# Patient Record
Sex: Male | Born: 1950
Health system: Southern US, Community
[De-identification: ages and names within clinical notes are randomized; demographics above are authoritative.]

## PROBLEM LIST (undated history)

## (undated) ENCOUNTER — Emergency Department: Discharge status: Absent without leave | Payer: Medicare HMO

## (undated) DIAGNOSIS — I6523 Occlusion and stenosis of bilateral carotid arteries: Secondary | ICD-10-CM

## (undated) DIAGNOSIS — I5023 Acute on chronic systolic (congestive) heart failure: Secondary | ICD-10-CM

## (undated) DIAGNOSIS — M869 Osteomyelitis, unspecified: Secondary | ICD-10-CM

## (undated) DIAGNOSIS — I255 Ischemic cardiomyopathy: Secondary | ICD-10-CM

## (undated) DIAGNOSIS — M4800 Spinal stenosis, site unspecified: Secondary | ICD-10-CM

## (undated) DIAGNOSIS — I700 Atherosclerosis of aorta: Secondary | ICD-10-CM

## (undated) DIAGNOSIS — R200 Anesthesia of skin: Secondary | ICD-10-CM

## (undated) DIAGNOSIS — C4490 Unspecified malignant neoplasm of skin, unspecified: Secondary | ICD-10-CM

## (undated) DIAGNOSIS — D509 Iron deficiency anemia, unspecified: Secondary | ICD-10-CM

## (undated) DIAGNOSIS — R7989 Other specified abnormal findings of blood chemistry: Secondary | ICD-10-CM

## (undated) DIAGNOSIS — I10 Essential (primary) hypertension: Secondary | ICD-10-CM

## (undated) DIAGNOSIS — E119 Type 2 diabetes mellitus without complications: Secondary | ICD-10-CM

## (undated) DIAGNOSIS — I83003 Varicose veins of unspecified lower extremity with ulcer of ankle: Secondary | ICD-10-CM

## (undated) DIAGNOSIS — M5136 Other intervertebral disc degeneration, lumbar region: Secondary | ICD-10-CM

## (undated) DIAGNOSIS — M48061 Spinal stenosis, lumbar region without neurogenic claudication: Secondary | ICD-10-CM

## (undated) DIAGNOSIS — M1990 Unspecified osteoarthritis, unspecified site: Secondary | ICD-10-CM

## (undated) DIAGNOSIS — J90 Pleural effusion, not elsewhere classified: Secondary | ICD-10-CM

## (undated) DIAGNOSIS — I2510 Atherosclerotic heart disease of native coronary artery without angina pectoris: Secondary | ICD-10-CM

## (undated) DIAGNOSIS — N032 Chronic nephritic syndrome with diffuse membranous glomerulonephritis: Secondary | ICD-10-CM

## (undated) DIAGNOSIS — I4510 Unspecified right bundle-branch block: Secondary | ICD-10-CM

## (undated) DIAGNOSIS — M7989 Other specified soft tissue disorders: Secondary | ICD-10-CM

## (undated) DIAGNOSIS — E1140 Type 2 diabetes mellitus with diabetic neuropathy, unspecified: Secondary | ICD-10-CM

## (undated) DIAGNOSIS — G4730 Sleep apnea, unspecified: Secondary | ICD-10-CM

## (undated) DIAGNOSIS — R0609 Other forms of dyspnea: Secondary | ICD-10-CM

## (undated) DIAGNOSIS — R931 Abnormal findings on diagnostic imaging of heart and coronary circulation: Secondary | ICD-10-CM

## (undated) DIAGNOSIS — I890 Lymphedema, not elsewhere classified: Secondary | ICD-10-CM

## (undated) DIAGNOSIS — G629 Polyneuropathy, unspecified: Secondary | ICD-10-CM

## (undated) DIAGNOSIS — Z9841 Cataract extraction status, right eye: Secondary | ICD-10-CM

## (undated) DIAGNOSIS — M51369 Other intervertebral disc degeneration, lumbar region without mention of lumbar back pain or lower extremity pain: Secondary | ICD-10-CM

## (undated) DIAGNOSIS — E785 Hyperlipidemia, unspecified: Secondary | ICD-10-CM

## (undated) DIAGNOSIS — N182 Chronic kidney disease, stage 2 (mild): Secondary | ICD-10-CM

## (undated) DIAGNOSIS — Z955 Presence of coronary angioplasty implant and graft: Secondary | ICD-10-CM

## (undated) DIAGNOSIS — R0600 Dyspnea, unspecified: Secondary | ICD-10-CM

## (undated) HISTORY — PX: JOINT REPLACEMENT: SHX530

## (undated) HISTORY — PX: CHOLECYSTECTOMY: SHX55

## (undated) HISTORY — PX: TOE AMPUTATION: SHX809

---

## 1983-03-17 HISTORY — PX: CHOLECYSTECTOMY: SHX55

## 1988-11-14 HISTORY — PX: UVULECTOMY: SHX2631

## 1998-03-16 DIAGNOSIS — Z955 Presence of coronary angioplasty implant and graft: Secondary | ICD-10-CM

## 1998-03-16 HISTORY — DX: Presence of coronary angioplasty implant and graft: Z95.5

## 1999-05-02 HISTORY — PX: CORONARY ANGIOPLASTY WITH STENT PLACEMENT: SHX49

## 2005-03-16 HISTORY — PX: TOTAL KNEE ARTHROPLASTY: SHX125

## 2005-03-16 HISTORY — PX: JOINT REPLACEMENT: SHX530

## 2005-03-25 ENCOUNTER — Emergency Department (HOSPITAL_COMMUNITY): Admission: EM | Admit: 2005-03-25 | Discharge: 2005-03-25 | Payer: Self-pay | Admitting: Emergency Medicine

## 2005-05-07 ENCOUNTER — Emergency Department (HOSPITAL_COMMUNITY): Admission: EM | Admit: 2005-05-07 | Discharge: 2005-05-07 | Payer: Self-pay | Admitting: Emergency Medicine

## 2006-08-25 IMAGING — CR DG CHEST 2V
2 series · 2 of 2 positions shown · non-contrast
Comparison: None.
COMPARISON: None.

CLINICAL DATA: Right knee DJD.  Pre-op respiratory exam. 
 CHEST ? 2 VIEW:

[w chest pa]
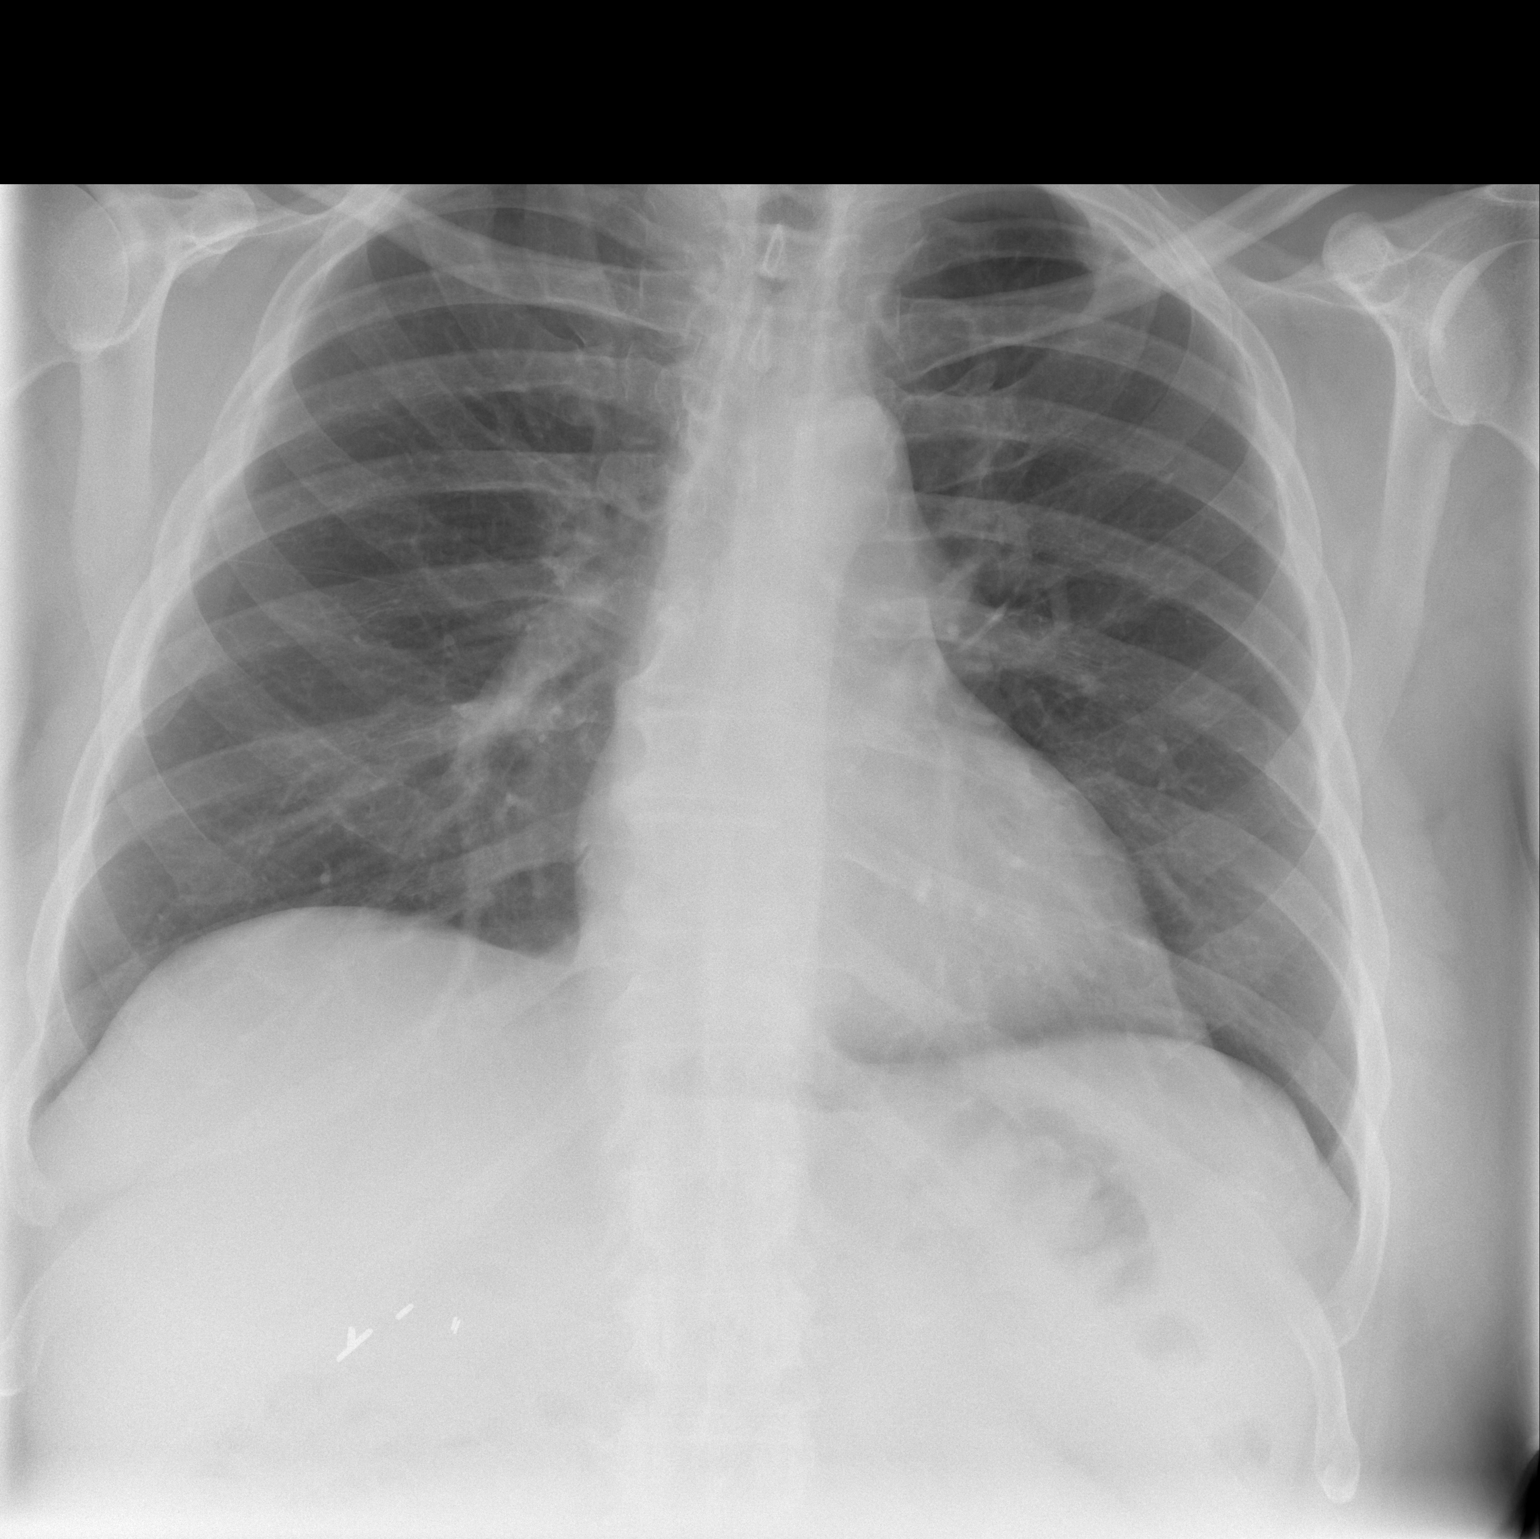

[w chest lat]
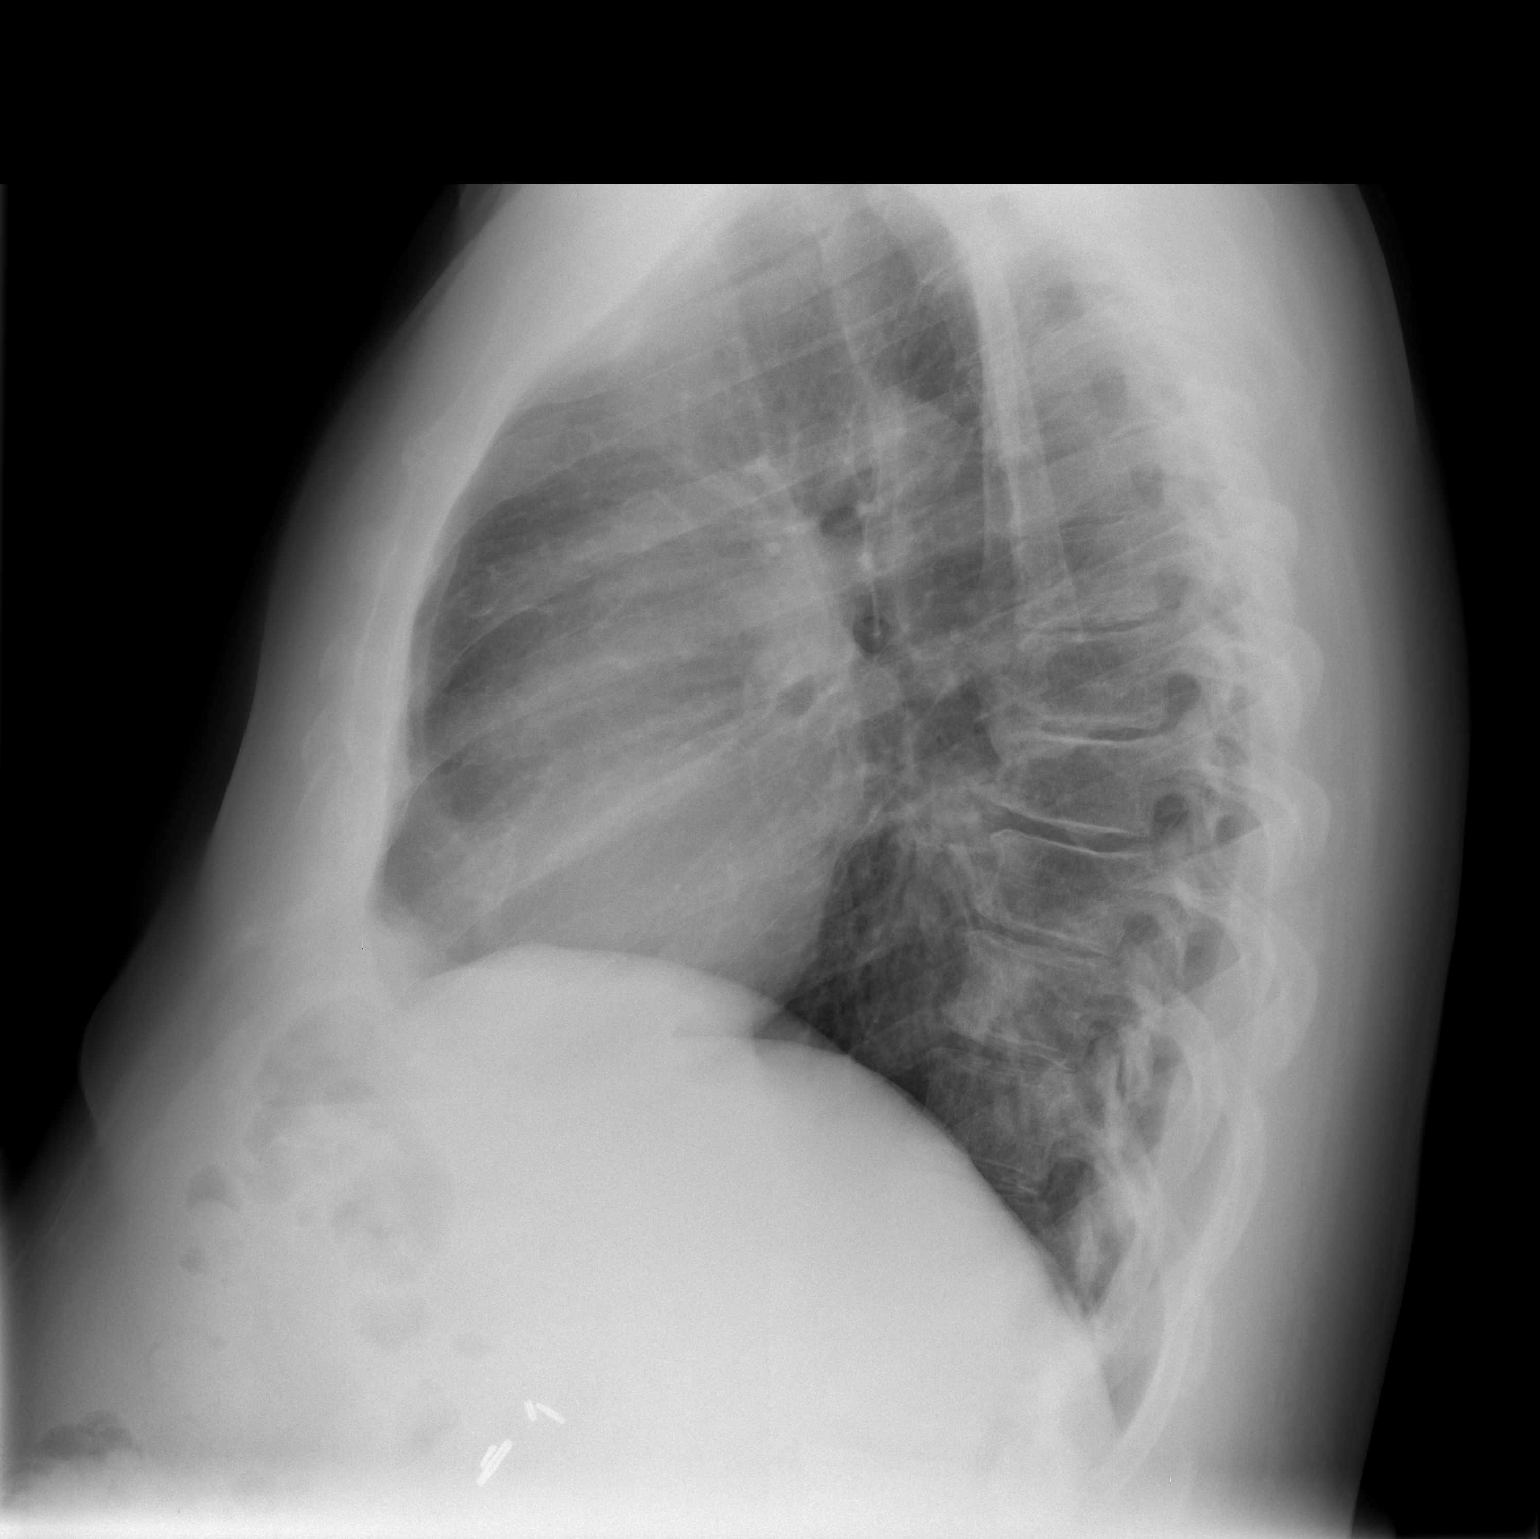

[2 of 2 positions shown; findings below may reference images not displayed]

FINDINGS: There is asymmetry of the [DATE] of ribs likely reflecting a developmental variant.  These do not appear to reflect cervical ribs.  The cardiomediastinal contours are normal.  The lungs are clear.  There is no pleural effusion.
IMPRESSION: No acute chest findings.  Asymmetry of the first ribs. 
 RIGHT KNEE ? 2 VIEW:
FINDINGS: There is a small knee joint effusion.  Tricompartmental degenerative changes are present, most advanced laterally and in the patellofemoral compartment.  No acute fracture or definite loose body is seen.
IMPRESSION: Tricompartmental osteoarthritis with small knee joint effusion.

## 2006-09-01 ENCOUNTER — Inpatient Hospital Stay (HOSPITAL_COMMUNITY): Admission: RE | Admit: 2006-09-01 | Discharge: 2006-09-04 | Payer: Self-pay | Admitting: Specialist

## 2006-10-01 ENCOUNTER — Encounter: Payer: Self-pay | Admitting: Specialist

## 2006-10-15 ENCOUNTER — Encounter: Payer: Self-pay | Admitting: Specialist

## 2006-11-15 ENCOUNTER — Encounter: Payer: Self-pay | Admitting: Specialist

## 2006-12-15 ENCOUNTER — Encounter: Payer: Self-pay | Admitting: Specialist

## 2007-01-15 ENCOUNTER — Encounter: Payer: Self-pay | Admitting: Specialist

## 2008-05-10 ENCOUNTER — Ambulatory Visit: Payer: Self-pay | Admitting: Internal Medicine

## 2008-07-06 ENCOUNTER — Ambulatory Visit: Payer: Self-pay

## 2009-05-15 ENCOUNTER — Ambulatory Visit: Payer: Self-pay | Admitting: Internal Medicine

## 2010-07-29 NOTE — Op Note (Signed)
NAMEJOMO, [name]                ACCOUNT NO.:  [id number]   MEDICAL RECORD NO.:  [id number]          PATIENT TYPE:  INP   LOCATION:  P329                         FACILITY:  [facility]   PHYSICIAN:  Jene Every, M.D.    DATE OF BIRTH:  1950/11/26   DATE OF PROCEDURE:  09/01/2006  DATE OF DISCHARGE:                               OPERATIVE REPORT   PREOPERATIVE DIAGNOSIS:  Degenerative joint disease, right knee.   POSTOPERATIVE DIAGNOSIS:  Degenerative joint disease, right knee.   PROCEDURE PERFORMED:  Right total knee arthroplasty.   ANESTHESIA:  General.   ASSISTANT:  Georges Lynch. Gioffre, M.D.   BRIEF HISTORY/INDICATIONS:  A 60 year old with valgus knee, end-stage  osteoarthritis refractory to conservative treatment.  Operative  intervention was indicated for replacement of degenerated joint.  Risks  and benefits discussed including bleeding, infection, damage to vascular  structures, DVT, PE, anesthetic complications, component failure,  contracture, need for manipulation and hardware revision.   TECHNIQUE:  With the patient in the supine position, after the induction  of adequate general anesthesia and 1 gm of Kefzol, the right lower  extremity is prepped and draped and exsanguinated in the usual sterile  fashion.  A thigh tourniquet is inflated to 350 mmHg.  A midline  incision was made over the patella in the anterior aspect of the tibia  medial to the tibial tubercle, full thickness through the skin and  subcutaneous tissue.  Performed a median patellar arthrotomy.  The  patella everted, and the knee was flexed.  Tricompartmental and severe  osteoarthrosis was noted.  A rongeur utilized to remove osteophytes from  all compartments.  The ACL was excised as well as the medial and lateral  meniscus.  Once mild elevation of the superficial medial collateral  ligament medially.  Prior to extension of the incision proximally and  distally due to his contracture.  A step drill was  utilized to enter the  femur, irrigated, T-handled Film/video editor.  A 5 degree left  intramedullary guide placed, pin.  An oscillating saw utilized to remove  first what we thought was 12 mm off of the distal femur, but we had to  have an additional 4 to take adequate bone off the femoral condyle  medially.  Next, we placed the distal femoral sizing guide and incised  to a 5 off the anterior cortex.  A distal femoral cutting block was then  applied after the appropriate holes were made in external rotations  through the external alignment jig.  Anterior and posterior chamfer cuts  were then performed with an oscillating saw.  Soft tissues were well  protected.  Next, a box cut was performed with a saw blade protecting  the posterior elements.  The tibia was then subluxed.  The remaining  menisci posteriorly were removed.  The remnant of the PCL removed.  I  thought there was some asymmetry __________ release, posterolateral  __________  release of the popliteus.  I then sized the tibia to a 5.  External alignment jig was placed, first 4 mm, then 6 mm off of the  tibia,  was utilized.  We tried for 4 first, then it was tightened to  flexion and extension with spacers, so we used a 6.  Removed the  posterior osteophytes but felt this was equal flexion and extension gap  with the spacers.  The tibia was subluxed.  We placed the trial 5 upon  that medial to the tibial tubercle with maximal coverage.  Placed our  step drill into the tibia, then the Delta thin punch.  We placed a trial  tibia, trial femur, and inserted a 10 insert.  Good flexion, good  extension, no lift-off.  Good stability of varus and valgus stress, 0-30  degrees.  Sized the patella to a 41.  Measured the patellar thickness.  Removed the appropriate amount on the planar.  Had 15 residual.  We used  the template and then drilled three peg holes, medializing the patella.  Placed a patellar button in there with flexion and  extension.  We  performed a slight lateral release to improve tracking.  It is  interesting that he had an osteophyte calcification of the distal aspect  of the patellar ligament, and we felt best to leave alone.  We felt  these components were satisfactory.  We removed all components, looked  posteriorly, removed any residual osteophytes, pulsatile lavage, cleaned  the knee.  The tibia was subluxed.  Protected with __________.  Dried  thoroughly.  Cement mixed on the back tablet in an appropriate fashion.  Placed the cement on the proximal tibia, then __________ of the femur.  Selected a 5 femur, 5 tibia.  Cemented the tibia in place.  Redundant  cement removed after impaction.  The femur in place, redundant cement  removed.  Placed a trial 10, reduced it in full extension.  The actual  load applied and full extension. Obvious redundant cement removed.  We  cemented the patellar button, applied the clamp.  After the appropriate  curing in the cement, removed the clamps and the insert.  After trialing  the flexion and extension, it was found to be satisfactory.  Redundant  cement was removed.  Bone wax on the cut cancellous surfaces that were  noted.  The wound was copiously irrigated again with pulsatile lavage.  We selected a 10 insert, inserting into place after subluxing the tibia.  Excellent stability for extension.  Full flexion at 140.  No lift off.  No subluxation.  No instability with varus or valgus stressing at 0-30  degrees with tracking of the patella.  Placed a Hemovac and brought it  out through a lateral stab wound in the skin.  Marcaine 0.25% with  epinephrine was placed in the joint.  We repaired the patellar tendon in  slight flexion with a #1 Vicryl interrupted figure-of-eight sutures.  The subcutaneous tissue was reapproximated with 2-0 Vicryl simple  sutures.  The skin was reapproximated with staples.  On pulling the thigh up, he had 90 degrees of flexion against  gravity.  The wound was  dressed sterilely and secured with an Ace bandage.  The tourniquet was  deflated with adequate vascularization  to the lower extremity.   The patient tolerated the procedure well with no complications.  Tourniquet time was 2 hours, 10 minutes.  The assistant, again, was Dr.  Darrelyn Hillock.      Jene Every, M.D.  Electronically Signed     JB/MEDQ  D:  09/01/2006  T:  09/01/2006  Job:  [id number]

## 2010-07-29 NOTE — H&P (Signed)
[name], [name]                ACCOUNT NO.:  [id number]   MEDICAL RECORD NO.:  [id number]          PATIENT TYPE:  INP   LOCATION:  NA                           FACILITY:  [facility]   PHYSICIAN:  Jene Every, M.D.    DATE OF BIRTH:  01-05-1951   DATE OF ADMISSION:  08/25/2006  DATE OF DISCHARGE:                              HISTORY & PHYSICAL   CHIEF COMPLAINT:  Right knee pain.   HISTORY:  [name] is a pleasant 60 year old gentleman who has had a  longstanding history of right knee pain.  He has previously undergone  arthroscopic debridement at another facility with a microfracture  technique.  He did okay from that but unfortunately, had recurrent pain  with noted mechanical type symptoms.  He also complained of loss of  range of motion that was very disabling for him.  He was first evaluated  by [place] in April where x-rays revealed end-stage collapse of the lateral  compartment with a varus deformity.  At that point, we did elect to  proceed with a localized cortisone injection.  Unfortunately, gave him  only short-term relief.  Dr. Shelle Iron did review old records from his  previous surgery from [facility] which again showed he has had  arthroscopy, no evidence of infection was noted at this time.  It is  felt at this point, due to the fact he has significant disabling  symptoms and end-stage changes of the knee, that he would benefit from a  total knee arthroplasty.  The risks and benefits of this were discussed  with the patient and medical clearance was obtained.  He does elect to  proceed.   MEDICAL HISTORY:  1. Coronary artery disease.  2. Previous history of diabetes mellitus.  3. Hyperlipidemia which is now diet controlled following weight loss.      He discontinued all medications.   CURRENT MEDICATIONS:  1. Aspirin 325 mg one p.o. daily.  2. Celebrex 200 mg one p.o. daily.  3. Darvocet N 100 one p.o. q.4-6h. p.r.n. pain.   ALLERGIES:  NONE.   PREVIOUS  SURGERY:  1. Cholecystectomy.  2. Right knee arthroscopy.  3. Stent placement in 2001.   SOCIAL HISTORY:  The patient is married.  No history of tobacco  consumption.  He does drink occasional wine. Primary care physicians are  at [facility] as well as his cardiologist.   FAMILY HISTORY:  Mother is 51 years old.  She has a history of breast  cancer as well as CVA.  Father deceased at age 91 with an MI.  He also  had a history of endocarditis.   REVIEW OF SYSTEMS:  GENERAL: The patient denies any fever, chills, night  sweats or bleeding tendencies.  CNS: No blurry, double vision, seizure,  headache or paralysis.  RESPIRATORY: No shortness of breath, productive  cough or hemoptysis.  CARDIOVASCULAR:  No chest pain, angina or  orthopnea. GU: No dysuria, hematuria or discharge.  GI: No nausea,  vomiting, diarrhea, constipation, melena or bloody stools.  MUSCULOSKELETAL:  As pertinent in HPI.   PHYSICAL EXAMINATION:  VITAL SIGNS:  Pulse is 70, respiratory rate 16,  BP 126/70.  GENERAL:  This well-developed, well-nourished gentleman who does well  with antalgic gait.  HEENT:  Atraumatic, normocephalic.  Pupils equal round and reactive to  light.  EOM intact.  NECK:  Supple with no lymphadenopathy.  CHEST:  Clear to auscultation bilaterally.  No rhonchi with wheezes or  rales.  BREASTS/GU:  Not examined, not pertinent to history of present illness.  HEART:  Regular rate and rhythm without murmurs, gallops or rubs.  ABDOMEN:  Soft, nontender, nondistended.  Bowel sounds x4.  SKIN:  No rashes or lesions are noted over.  EXTREMITIES:  With regard to the knee, he does have mild effusion noted.  He is 16 degrees of valgus deformity.  He does have a 5-10 degree  flexion contracture.  He is tender along the medial and lateral joint  line.   IMPRESSION:  End-stage degenerative changes of the right knee.   PLAN:  The patient will be admitted to [facility] to undergo a  right  total knee arthroplasty.      Roma Schanz, P.A.      Jene Every, M.D.  Electronically Signed    CS/MEDQ  D:  08/25/2006  T:  08/25/2006  Job:  [id number]

## 2010-11-25 ENCOUNTER — Other Ambulatory Visit: Payer: Self-pay | Admitting: Occupational Medicine

## 2010-11-25 ENCOUNTER — Ambulatory Visit: Payer: Self-pay

## 2010-11-25 DIAGNOSIS — M549 Dorsalgia, unspecified: Secondary | ICD-10-CM

## 2010-12-31 LAB — CBC
HCT: 25 — ABNORMAL LOW
HCT: 32.3 — ABNORMAL LOW
Hemoglobin: 11.4 — ABNORMAL LOW
MCHC: 34.2
MCHC: 35.3
MCV: 85.4
Platelets: 167
RBC: 3.25 — ABNORMAL LOW
RDW: 13.9
WBC: 5.9

## 2010-12-31 LAB — BASIC METABOLIC PANEL
BUN: 11
BUN: 6
CO2: 26
CO2: 28
CO2: 29
Calcium: 8.2 — ABNORMAL LOW
Calcium: 8.2 — ABNORMAL LOW
Chloride: 102
Chloride: 102
Creatinine, Ser: 0.71
Creatinine, Ser: 0.8
GFR calc Af Amer: >60
GFR calc non Af Amer: >60
Glucose, Bld: 176 — ABNORMAL HIGH
Glucose, Bld: 212 — ABNORMAL HIGH
Potassium: 4
Sodium: 136
Sodium: 137

## 2010-12-31 LAB — PROTIME-INR
Prothrombin Time: 14
Prothrombin Time: 18.7 — ABNORMAL HIGH

## 2010-12-31 LAB — TYPE AND SCREEN: ABO/RH(D): A POS

## 2010-12-31 LAB — ABO/RH: ABO/RH(D): A POS

## 2011-01-01 LAB — BASIC METABOLIC PANEL
BUN: 13
CO2: 26
Calcium: 9.3
Chloride: 106
Creatinine, Ser: 0.72
GFR calc Af Amer: >60
GFR calc non Af Amer: >60
Glucose, Bld: 235 — ABNORMAL HIGH
Sodium: 139

## 2011-01-01 LAB — URINALYSIS, ROUTINE W REFLEX MICROSCOPIC
Glucose, UA: >1000 — AB
Ketones, ur: NEGATIVE
Nitrite: NEGATIVE
Protein, ur: NEGATIVE
Specific Gravity, Urine: 1.041 — ABNORMAL HIGH
Urobilinogen, UA: 0.2
pH: 5

## 2011-01-01 LAB — URINE MICROSCOPIC-ADD ON

## 2011-01-01 LAB — CBC: Hemoglobin: 14.5

## 2011-03-31 ENCOUNTER — Inpatient Hospital Stay: Payer: Self-pay | Admitting: Internal Medicine

## 2011-03-31 LAB — COMPREHENSIVE METABOLIC PANEL
Alkaline Phosphatase: 84 U/L (ref 50–136)
Bilirubin,Total: 0.9 mg/dL (ref 0.2–1.0)
Co2: 28 mmol/L (ref 21–32)
Creatinine: 0.79 mg/dL (ref 0.60–1.30)
EGFR (African American): >60
Glucose: 327 mg/dL — ABNORMAL HIGH (ref 65–99)
Osmolality: 293 (ref 275–301)
Potassium: 4.1 mmol/L (ref 3.5–5.1)
SGPT (ALT): 41 U/L
Total Protein: 6.4 g/dL (ref 6.4–8.2)

## 2011-03-31 LAB — CBC WITH DIFFERENTIAL/PLATELET
Eosinophil %: 1.1 %
HCT: 31.6 % — ABNORMAL LOW (ref 40.0–52.0)
HGB: 10.8 g/dL — ABNORMAL LOW (ref 13.0–18.0)
Lymphocyte #: 1.3 10*3/uL (ref 1.0–3.6)
MCH: 29.5 pg (ref 26.0–34.0)
MCV: 86 fL (ref 80–100)
Monocyte %: 6.5 %
Neutrophil #: 5.2 10*3/uL (ref 1.4–6.5)
RBC: 3.66 10*6/uL — ABNORMAL LOW (ref 4.40–5.90)
RDW: 15.8 % — ABNORMAL HIGH (ref 11.5–14.5)

## 2011-04-02 LAB — CBC WITH DIFFERENTIAL/PLATELET
Basophil #: 0 10*3/uL (ref 0.0–0.1)
Eosinophil #: 0.1 10*3/uL (ref 0.0–0.7)
Lymphocyte #: 1.4 10*3/uL (ref 1.0–3.6)
MCV: 88 fL (ref 80–100)
Monocyte %: 6.1 %
Platelet: 194 10*3/uL (ref 150–440)
RDW: 15.5 % — ABNORMAL HIGH (ref 11.5–14.5)
WBC: 5.4 10*3/uL (ref 3.8–10.6)

## 2011-04-02 LAB — BASIC METABOLIC PANEL
Creatinine: 0.69 mg/dL (ref 0.60–1.30)
EGFR (African American): >60
EGFR (Non-African Amer.): >60
Glucose: 191 mg/dL — ABNORMAL HIGH (ref 65–99)
Sodium: 143 mmol/L (ref 136–145)

## 2011-04-03 LAB — CBC WITH DIFFERENTIAL/PLATELET
Basophil #: 0 10*3/uL (ref 0.0–0.1)
Basophil %: 0.3 %
Eosinophil #: 0.1 10*3/uL (ref 0.0–0.7)
HGB: 10.5 g/dL — ABNORMAL LOW (ref 13.0–18.0)
Lymphocyte %: 20.7 %
MCH: 29.3 pg (ref 26.0–34.0)
MCHC: 33.4 g/dL (ref 32.0–36.0)
Monocyte #: 0.3 10*3/uL (ref 0.0–0.7)
Neutrophil %: 71.5 %
Platelet: 210 10*3/uL (ref 150–440)
RBC: 3.58 10*6/uL — ABNORMAL LOW (ref 4.40–5.90)
RDW: 16.3 % — ABNORMAL HIGH (ref 11.5–14.5)
WBC: 5.9 10*3/uL (ref 3.8–10.6)

## 2011-04-03 LAB — GLUCOSE, RANDOM: Glucose: 104 mg/dL — ABNORMAL HIGH (ref 65–99)

## 2011-04-04 LAB — SEDIMENTATION RATE: Erythrocyte Sed Rate: 34 mm/hr — ABNORMAL HIGH (ref 0–20)

## 2011-04-05 LAB — WOUND CULTURE

## 2011-04-06 LAB — CBC WITH DIFFERENTIAL/PLATELET
Basophil %: 0.4 %
Eosinophil %: 0.6 %
Lymphocyte %: 5.8 %
MCV: 87 fL (ref 80–100)
Monocyte %: 8.7 %
Platelet: 184 10*3/uL (ref 150–440)
RDW: 16.4 % — ABNORMAL HIGH (ref 11.5–14.5)
WBC: 7.1 10*3/uL (ref 3.8–10.6)

## 2011-04-06 LAB — URINALYSIS, COMPLETE
Bilirubin,UR: NEGATIVE
Glucose,UR: 50 mg/dL (ref 0–75)
RBC,UR: 2 /HPF (ref 0–5)
Squamous Epithelial: <1
WBC UR: 3 /HPF (ref 0–5)

## 2011-04-06 LAB — BASIC METABOLIC PANEL
Anion Gap: 12 (ref 7–16)
Calcium, Total: 8 mg/dL — ABNORMAL LOW (ref 8.5–10.1)
Calcium, Total: 7.7 mg/dL — ABNORMAL LOW (ref 8.5–10.1)
Co2: 24 mmol/L (ref 21–32)
Co2: 24 mmol/L (ref 21–32)
EGFR (African American): 11 — ABNORMAL LOW
Osmolality: 291 (ref 275–301)
Potassium: 4.2 mmol/L (ref 3.5–5.1)
Sodium: 139 mmol/L (ref 136–145)
Sodium: 138 mmol/L (ref 136–145)

## 2011-04-06 LAB — CULTURE, BLOOD (SINGLE)

## 2011-04-07 LAB — BASIC METABOLIC PANEL
Anion Gap: 12 (ref 7–16)
Calcium, Total: 7.7 mg/dL — ABNORMAL LOW (ref 8.5–10.1)
Co2: 23 mmol/L (ref 21–32)
Creatinine: 7.42 mg/dL — ABNORMAL HIGH (ref 0.60–1.30)
EGFR (African American): 10 — ABNORMAL LOW
Osmolality: 290 (ref 275–301)

## 2011-04-07 LAB — PROTEIN / CREATININE RATIO, URINE: Protein/Creat. Ratio: 436 mg/gCREAT — ABNORMAL HIGH (ref 0–200)

## 2011-04-07 LAB — WOUND CULTURE

## 2011-04-08 LAB — PROTEIN ELECTROPHORESIS(ARMC)

## 2011-04-08 LAB — UR PROT ELECTROPHORESIS, URINE RANDOM

## 2011-04-09 LAB — BASIC METABOLIC PANEL
BUN: 71 mg/dL — ABNORMAL HIGH (ref 7–18)
Chloride: 106 mmol/L (ref 98–107)
Creatinine: 8.24 mg/dL — ABNORMAL HIGH (ref 0.60–1.30)
EGFR (Non-African Amer.): 7 — ABNORMAL LOW
Glucose: 115 mg/dL — ABNORMAL HIGH (ref 65–99)
Osmolality: 301 (ref 275–301)
Potassium: 4.6 mmol/L (ref 3.5–5.1)
Sodium: 140 mmol/L (ref 136–145)

## 2011-04-09 LAB — VANCOMYCIN, RANDOM: Vancomycin, Random: >50 ug/mL

## 2011-04-10 LAB — CBC WITH DIFFERENTIAL/PLATELET
Basophil #: 0 10*3/uL (ref 0.0–0.1)
Basophil %: 0.1 %
Eosinophil %: 2.1 %
HGB: 9.9 g/dL — ABNORMAL LOW (ref 13.0–18.0)
Lymphocyte #: 0.5 10*3/uL — ABNORMAL LOW (ref 1.0–3.6)
MCH: 29.4 pg (ref 26.0–34.0)
MCHC: 34.3 g/dL (ref 32.0–36.0)
MCV: 86 fL (ref 80–100)
Neutrophil #: 4.3 10*3/uL (ref 1.4–6.5)
RBC: 3.37 10*6/uL — ABNORMAL LOW (ref 4.40–5.90)

## 2011-04-10 LAB — VANCOMYCIN, RANDOM: Vancomycin, Random: >50 ug/mL

## 2011-04-10 LAB — BASIC METABOLIC PANEL
Anion Gap: 15 (ref 7–16)
BUN: 72 mg/dL — ABNORMAL HIGH (ref 7–18)
Co2: 19 mmol/L — ABNORMAL LOW (ref 21–32)
Creatinine: 7.46 mg/dL — ABNORMAL HIGH (ref 0.60–1.30)
EGFR (African American): 10 — ABNORMAL LOW
Glucose: 167 mg/dL — ABNORMAL HIGH (ref 65–99)
Sodium: 140 mmol/L (ref 136–145)

## 2011-04-10 LAB — PHOSPHORUS: Phosphorus: 6.7 mg/dL — ABNORMAL HIGH (ref 2.5–4.9)

## 2011-04-11 LAB — CBC WITH DIFFERENTIAL/PLATELET
Basophil #: 0 10*3/uL (ref 0.0–0.1)
Eosinophil #: 0.1 10*3/uL (ref 0.0–0.7)
HCT: 27.4 % — ABNORMAL LOW (ref 40.0–52.0)
HGB: 9.2 g/dL — ABNORMAL LOW (ref 13.0–18.0)
Lymphocyte %: 8.8 %
Monocyte %: 9.6 %
Neutrophil #: 4.3 10*3/uL (ref 1.4–6.5)
Neutrophil %: 80 %
RDW: 16.1 % — ABNORMAL HIGH (ref 11.5–14.5)
WBC: 5.4 10*3/uL (ref 3.8–10.6)

## 2011-04-11 LAB — BASIC METABOLIC PANEL
Chloride: 107 mmol/L (ref 98–107)
Creatinine: 6.1 mg/dL — ABNORMAL HIGH (ref 0.60–1.30)
Osmolality: 306 (ref 275–301)
Potassium: 4.1 mmol/L (ref 3.5–5.1)
Sodium: 145 mmol/L (ref 136–145)

## 2011-04-11 LAB — VANCOMYCIN, RANDOM: Vancomycin, Random: 48 ug/mL

## 2011-04-11 LAB — PHOSPHORUS: Phosphorus: 5.7 mg/dL — ABNORMAL HIGH (ref 2.5–4.9)

## 2011-04-12 LAB — RENAL FUNCTION PANEL
Albumin: 2 g/dL — ABNORMAL LOW (ref 3.4–5.0)
Chloride: 105 mmol/L (ref 98–107)
Co2: 27 mmol/L (ref 21–32)
Creatinine: 4.72 mg/dL — ABNORMAL HIGH (ref 0.60–1.30)
EGFR (African American): 16 — ABNORMAL LOW
EGFR (Non-African Amer.): 13 — ABNORMAL LOW
Glucose: 77 mg/dL (ref 65–99)
Potassium: 4.1 mmol/L (ref 3.5–5.1)
Sodium: 144 mmol/L (ref 136–145)

## 2011-04-12 LAB — VANCOMYCIN, TROUGH: Vancomycin, Trough: 23 ug/mL (ref 10–20)

## 2011-04-12 LAB — PHOSPHORUS: Phosphorus: 4.8 mg/dL (ref 2.5–4.9)

## 2011-04-13 LAB — BASIC METABOLIC PANEL
Anion Gap: 7 (ref 7–16)
BUN: 19 mg/dL — ABNORMAL HIGH (ref 7–18)
Chloride: 99 mmol/L (ref 98–107)
Creatinine: 2.61 mg/dL — ABNORMAL HIGH (ref 0.60–1.30)
EGFR (African American): 32 — ABNORMAL LOW
EGFR (Non-African Amer.): 27 — ABNORMAL LOW
Glucose: 94 mg/dL (ref 65–99)
Osmolality: 280 (ref 275–301)
Sodium: 139 mmol/L (ref 136–145)

## 2011-04-13 LAB — PHOSPHORUS: Phosphorus: 4.1 mg/dL (ref 2.5–4.9)

## 2011-04-14 LAB — BASIC METABOLIC PANEL
BUN: 29 mg/dL — ABNORMAL HIGH (ref 7–18)
Calcium, Total: 7.4 mg/dL — ABNORMAL LOW (ref 8.5–10.1)
Co2: 32 mmol/L (ref 21–32)
EGFR (African American): 22 — ABNORMAL LOW
EGFR (Non-African Amer.): 18 — ABNORMAL LOW
Glucose: 101 mg/dL — ABNORMAL HIGH (ref 65–99)
Potassium: 4.2 mmol/L (ref 3.5–5.1)
Sodium: 141 mmol/L (ref 136–145)

## 2011-04-14 LAB — PLATELET COUNT: Platelet: 214 10*3/uL (ref 150–440)

## 2011-04-15 LAB — SEDIMENTATION RATE: Erythrocyte Sed Rate: 29 mm/hr — ABNORMAL HIGH (ref 0–20)

## 2011-04-15 LAB — CBC WITH DIFFERENTIAL/PLATELET
Basophil #: 0 10*3/uL (ref 0.0–0.1)
HCT: 28.9 % — ABNORMAL LOW (ref 40.0–52.0)
HGB: 9.5 g/dL — ABNORMAL LOW (ref 13.0–18.0)
Lymphocyte %: 14.4 %
MCV: 87 fL (ref 80–100)
Monocyte #: 0.5 10*3/uL (ref 0.0–0.7)
Monocyte %: 8.1 %
Platelet: 203 10*3/uL (ref 150–440)
RBC: 3.34 10*6/uL — ABNORMAL LOW (ref 4.40–5.90)
RDW: 15.8 % — ABNORMAL HIGH (ref 11.5–14.5)
WBC: 6.4 10*3/uL (ref 3.8–10.6)

## 2011-04-15 LAB — BASIC METABOLIC PANEL
Anion Gap: 11 (ref 7–16)
BUN: 46 mg/dL — ABNORMAL HIGH (ref 7–18)
Chloride: 98 mmol/L (ref 98–107)
Co2: 33 mmol/L — ABNORMAL HIGH (ref 21–32)
EGFR (African American): 15 — ABNORMAL LOW
Glucose: 111 mg/dL — ABNORMAL HIGH (ref 65–99)
Osmolality: 296 (ref 275–301)

## 2011-05-28 DIAGNOSIS — N289 Disorder of kidney and ureter, unspecified: Secondary | ICD-10-CM | POA: Insufficient documentation

## 2011-08-21 ENCOUNTER — Other Ambulatory Visit: Payer: Self-pay | Admitting: Specialist

## 2011-08-21 DIAGNOSIS — M549 Dorsalgia, unspecified: Secondary | ICD-10-CM

## 2011-08-26 ENCOUNTER — Ambulatory Visit
Admission: RE | Admit: 2011-08-26 | Discharge: 2011-08-26 | Disposition: A | Discharge status: Home / Self Care | Payer: Self-pay | Source: Ambulatory Visit | Attending: Specialist | Admitting: Specialist

## 2011-08-26 VITALS — BP 138/74 | HR 73

## 2011-08-26 DIAGNOSIS — M549 Dorsalgia, unspecified: Secondary | ICD-10-CM

## 2011-08-26 MED ORDER — ONDANSETRON HCL 4 MG/2ML IJ SOLN: 4.0000 mg | Freq: Four times a day (QID) | INTRAMUSCULAR | Status: DC | PRN

## 2011-08-26 MED ORDER — DIAZEPAM 5 MG PO TABS
10.0000 mg | ORAL_TABLET | Freq: Once | ORAL | Status: AC
  Administered 2011-08-26: 10 mg via ORAL

## 2011-08-26 NOTE — Discharge Instructions (Signed)

## 2011-09-16 ENCOUNTER — Other Ambulatory Visit: Payer: Self-pay | Admitting: Podiatry

## 2011-09-19 LAB — WOUND AEROBIC CULTURE

## 2012-04-04 ENCOUNTER — Other Ambulatory Visit: Payer: Self-pay | Admitting: Orthopedic Surgery

## 2012-04-05 ENCOUNTER — Encounter (HOSPITAL_COMMUNITY): Payer: Self-pay | Admitting: Pharmacy Technician

## 2012-04-06 ENCOUNTER — Encounter (HOSPITAL_COMMUNITY): Payer: Self-pay

## 2012-04-06 ENCOUNTER — Ambulatory Visit (HOSPITAL_COMMUNITY)
Admission: RE | Admit: 2012-04-06 | Discharge: 2012-04-06 | Disposition: A | Discharge status: Home / Self Care | Payer: Worker's Compensation | Source: Ambulatory Visit | Attending: Orthopedic Surgery | Admitting: Orthopedic Surgery

## 2012-04-06 ENCOUNTER — Encounter (HOSPITAL_COMMUNITY)
Admission: RE | Admit: 2012-04-06 | Discharge: 2012-04-06 | Disposition: A | Discharge status: Home / Self Care | Payer: Worker's Compensation | Source: Ambulatory Visit | Attending: Specialist | Admitting: Specialist

## 2012-04-06 ENCOUNTER — Ambulatory Visit (HOSPITAL_COMMUNITY)
Admission: RE | Admit: 2012-04-06 | Discharge: 2012-04-06 | Disposition: A | Discharge status: Home / Self Care | Payer: Worker's Compensation | Source: Ambulatory Visit | Attending: Specialist | Admitting: Specialist

## 2012-04-06 DIAGNOSIS — Z9089 Acquired absence of other organs: Secondary | ICD-10-CM | POA: Insufficient documentation

## 2012-04-06 DIAGNOSIS — Z01818 Encounter for other preprocedural examination: Secondary | ICD-10-CM | POA: Insufficient documentation

## 2012-04-06 DIAGNOSIS — M5137 Other intervertebral disc degeneration, lumbosacral region: Secondary | ICD-10-CM | POA: Insufficient documentation

## 2012-04-06 DIAGNOSIS — M51379 Other intervertebral disc degeneration, lumbosacral region without mention of lumbar back pain or lower extremity pain: Secondary | ICD-10-CM | POA: Insufficient documentation

## 2012-04-06 HISTORY — DX: Atherosclerotic heart disease of native coronary artery without angina pectoris: I25.10

## 2012-04-06 HISTORY — DX: Anesthesia of skin: R20.0

## 2012-04-06 HISTORY — DX: Unspecified osteoarthritis, unspecified site: M19.90

## 2012-04-06 HISTORY — DX: Presence of coronary angioplasty implant and graft: Z95.5

## 2012-04-06 HISTORY — DX: Essential (primary) hypertension: I10

## 2012-04-06 HISTORY — DX: Type 2 diabetes mellitus without complications: E11.9

## 2012-04-06 HISTORY — DX: Unspecified malignant neoplasm of skin, unspecified: C44.90

## 2012-04-06 HISTORY — DX: Spinal stenosis, site unspecified: M48.00

## 2012-04-06 HISTORY — DX: Hyperlipidemia, unspecified: E78.5

## 2012-04-06 LAB — CBC
Platelets: 200 10*3/uL (ref 150–400)
RDW: 14.2 % (ref 11.5–15.5)
WBC: 7.8 10*3/uL (ref 4.0–10.5)

## 2012-04-06 LAB — BASIC METABOLIC PANEL
Calcium: 9.3 mg/dL (ref 8.4–10.5)
Creatinine, Ser: 0.82 mg/dL (ref 0.50–1.35)
GFR calc Af Amer: >90 mL/min (ref >90)

## 2012-04-06 LAB — SURGICAL PCR SCREEN: Staphylococcus aureus: NEGATIVE

## 2012-04-06 NOTE — Patient Instructions (Signed)
[name]  04/06/2012                           YOUR PROCEDURE IS SCHEDULED ON:  04/13/12               PLEASE REPORT TO SHORT STAY CENTER AT :  7:30 AM               CALL THIS NUMBER IF ANY PROBLEMS THE DAY OF SURGERY :               [phone]                      REMEMBER:   Do not eat food or drink liquids AFTER MIDNIGHT    Take these medicines the morning of surgery with A SIP OF WATER: HYDROCODONE IF NEED   Do not wear jewelry, make-up   Do not wear lotions, powders, or perfumes.   Do not shave legs or underarms 12 hrs. before surgery (men may shave face)  Do not bring valuables to the hospital.  Contacts, dentures or bridgework may not be worn into surgery.  Leave suitcase in the car. After surgery it may be brought to your room.  For patients admitted to the hospital more than one night, checkout time is 11:00                          The day of discharge.   Patients discharged the day of surgery will not be allowed to drive home                             If going home same day of surgery, must have someone stay with you first                           24 hrs at home and arrange for some one to drive you home from hospital.    Special Instructions:   Please read over the following fact sheets that you were given:               1. MRSA  INFORMATION                      2. [place] PREPARING FOR SURGERY SHEET               3. INCENTIVE SPIROMETER                                                X_____________________________________________________________________        Failure to follow these instructions may result in cancellation of your surgery

## 2012-04-06 NOTE — Progress Notes (Signed)
04/06/12 1002  OBSTRUCTIVE SLEEP APNEA  Have you ever been diagnosed with sleep apnea through a sleep study? Yes (had corrective surgical procedure for sleep apnea)  Do you snore loudly (loud enough to be heard through closed doors)?  0  Do you often feel tired, fatigued, or sleepy during the daytime? 0  Has anyone observed you stop breathing during your sleep? 0 (not since uvulectomy)  Do you have, or are you being treated for high blood pressure? 1  BMI more than 35 kg/m2? 1  Age over 24 years old? 1  Neck circumference greater than 40 cm/18 inches? 1  Gender: 1  Obstructive Sleep Apnea Score 5   Score 4 or greater  Results sent to PCP

## 2012-04-12 MED ORDER — DEXTROSE 5 % IV SOLN
3.0000 g | INTRAVENOUS | Status: AC
  Administered 2012-04-13: 3 g via INTRAVENOUS
  Filled 2012-04-12: qty 3000

## 2012-04-13 ENCOUNTER — Ambulatory Visit (HOSPITAL_COMMUNITY): Payer: Worker's Compensation

## 2012-04-13 ENCOUNTER — Observation Stay (HOSPITAL_COMMUNITY)
Admission: RE | Admit: 2012-04-13 | Discharge: 2012-04-14 | Disposition: A | Discharge status: Home / Self Care | Payer: Worker's Compensation | Source: Ambulatory Visit | Attending: Specialist | Admitting: Specialist

## 2012-04-13 ENCOUNTER — Ambulatory Visit (HOSPITAL_COMMUNITY): Payer: Worker's Compensation | Admitting: Anesthesiology

## 2012-04-13 ENCOUNTER — Encounter (HOSPITAL_COMMUNITY): Payer: Self-pay | Admitting: Anesthesiology

## 2012-04-13 ENCOUNTER — Encounter (HOSPITAL_COMMUNITY)
Admission: RE | Disposition: A | Discharge status: Home / Self Care | Payer: Self-pay | Source: Ambulatory Visit | Attending: Specialist

## 2012-04-13 ENCOUNTER — Encounter (HOSPITAL_COMMUNITY): Payer: Self-pay

## 2012-04-13 DIAGNOSIS — E785 Hyperlipidemia, unspecified: Secondary | ICD-10-CM | POA: Insufficient documentation

## 2012-04-13 DIAGNOSIS — Z85828 Personal history of other malignant neoplasm of skin: Secondary | ICD-10-CM | POA: Insufficient documentation

## 2012-04-13 DIAGNOSIS — Z01812 Encounter for preprocedural laboratory examination: Secondary | ICD-10-CM | POA: Insufficient documentation

## 2012-04-13 DIAGNOSIS — M48061 Spinal stenosis, lumbar region without neurogenic claudication: Principal | ICD-10-CM

## 2012-04-13 DIAGNOSIS — E119 Type 2 diabetes mellitus without complications: Secondary | ICD-10-CM | POA: Insufficient documentation

## 2012-04-13 DIAGNOSIS — Z79899 Other long term (current) drug therapy: Secondary | ICD-10-CM | POA: Insufficient documentation

## 2012-04-13 DIAGNOSIS — Z6838 Body mass index (BMI) 38.0-38.9, adult: Secondary | ICD-10-CM | POA: Insufficient documentation

## 2012-04-13 DIAGNOSIS — I2510 Atherosclerotic heart disease of native coronary artery without angina pectoris: Secondary | ICD-10-CM | POA: Insufficient documentation

## 2012-04-13 DIAGNOSIS — Z96659 Presence of unspecified artificial knee joint: Secondary | ICD-10-CM | POA: Insufficient documentation

## 2012-04-13 DIAGNOSIS — I10 Essential (primary) hypertension: Secondary | ICD-10-CM | POA: Insufficient documentation

## 2012-04-13 HISTORY — PX: LUMBAR LAMINECTOMY/DECOMPRESSION MICRODISCECTOMY: SHX5026

## 2012-04-13 LAB — GLUCOSE, CAPILLARY
Glucose-Capillary: 218 mg/dL — ABNORMAL HIGH (ref 70–99)
Glucose-Capillary: 142 mg/dL — ABNORMAL HIGH (ref 70–99)
Glucose-Capillary: 179 mg/dL — ABNORMAL HIGH (ref 70–99)

## 2012-04-13 SURGERY — LUMBAR LAMINECTOMY/DECOMPRESSION MICRODISCECTOMY 1 LEVEL
Anesthesia: General | Site: Back | Laterality: Bilateral | Wound class: Clean

## 2012-04-13 MED ORDER — THROMBIN 5000 UNITS EX SOLR
OROMUCOSAL | Status: DC | PRN
  Administered 2012-04-13: 11:00:00 via TOPICAL

## 2012-04-13 MED ORDER — METFORMIN HCL 500 MG PO TABS
1000.0000 mg | ORAL_TABLET | Freq: Two times a day (BID) | ORAL | Status: DC
  Administered 2012-04-13 – 2012-04-14 (×2): 1000 mg via ORAL
  Filled 2012-04-13 (×4): qty 2

## 2012-04-13 MED ORDER — CEFAZOLIN SODIUM-DEXTROSE 2-3 GM-% IV SOLR
2.0000 g | Freq: Three times a day (TID) | INTRAVENOUS | Status: AC
  Administered 2012-04-13 – 2012-04-14 (×2): 2 g via INTRAVENOUS
  Filled 2012-04-13 (×2): qty 50

## 2012-04-13 MED ORDER — POTASSIUM CHLORIDE IN NACL 20-0.9 MEQ/L-% IV SOLN
INTRAVENOUS | Status: DC
  Filled 2012-04-13 (×2): qty 1000

## 2012-04-13 MED ORDER — GLYCOPYRROLATE 0.2 MG/ML IJ SOLN
INTRAMUSCULAR | Status: DC | PRN
  Administered 2012-04-13: .5 mg via INTRAVENOUS

## 2012-04-13 MED ORDER — CHLORHEXIDINE GLUCONATE 4 % EX LIQD: 60.0000 mL | Freq: Once | CUTANEOUS | Status: DC

## 2012-04-13 MED ORDER — LACTATED RINGERS IV SOLN: INTRAVENOUS | Status: DC | PRN

## 2012-04-13 MED ORDER — SODIUM CHLORIDE 0.9 % IV SOLN
250.0000 mL | INTRAVENOUS | Status: DC
  Administered 2012-04-13: 250 mL via INTRAVENOUS

## 2012-04-13 MED ORDER — LACTATED RINGERS IV SOLN
INTRAVENOUS | Status: DC
  Administered 2012-04-13 (×2): via INTRAVENOUS

## 2012-04-13 MED ORDER — MEPERIDINE HCL 50 MG/ML IJ SOLN: 6.2500 mg | INTRAMUSCULAR | Status: DC | PRN

## 2012-04-13 MED ORDER — METHOCARBAMOL 500 MG PO TABS: 500.0000 mg | ORAL_TABLET | Freq: Three times a day (TID) | ORAL | Status: DC | PRN

## 2012-04-13 MED ORDER — OXYCODONE-ACETAMINOPHEN 5-325 MG PO TABS: 1.0000 | ORAL_TABLET | ORAL | Status: DC | PRN

## 2012-04-13 MED ORDER — ROCURONIUM BROMIDE 100 MG/10ML IV SOLN
INTRAVENOUS | Status: DC | PRN
  Administered 2012-04-13: 38 mg via INTRAVENOUS
  Administered 2012-04-13: 2 mg via INTRAVENOUS

## 2012-04-13 MED ORDER — SODIUM CHLORIDE 0.9 % IJ SOLN: 3.0000 mL | Freq: Two times a day (BID) | INTRAMUSCULAR | Status: DC

## 2012-04-13 MED ORDER — INSULIN ASPART 100 UNIT/ML ~~LOC~~ SOLN
SUBCUTANEOUS | Status: DC | PRN
  Administered 2012-04-13: 4 [IU] via SUBCUTANEOUS

## 2012-04-13 MED ORDER — ACETAMINOPHEN 650 MG RE SUPP: 650.0000 mg | RECTAL | Status: DC | PRN

## 2012-04-13 MED ORDER — MENTHOL 3 MG MT LOZG: 1.0000 | LOZENGE | OROMUCOSAL | Status: DC | PRN

## 2012-04-13 MED ORDER — PHENOL 1.4 % MT LIQD: 1.0000 | OROMUCOSAL | Status: DC | PRN

## 2012-04-13 MED ORDER — BUPIVACAINE-EPINEPHRINE 0.5% -1:200000 IJ SOLN
INTRAMUSCULAR | Status: DC | PRN
  Administered 2012-04-13: 13 mL

## 2012-04-13 MED ORDER — HYDROMORPHONE HCL PF 1 MG/ML IJ SOLN: 1.0000 mg | INTRAMUSCULAR | Status: DC | PRN

## 2012-04-13 MED ORDER — MIDAZOLAM HCL 5 MG/5ML IJ SOLN
INTRAMUSCULAR | Status: DC | PRN
  Administered 2012-04-13: 2 mg via INTRAVENOUS

## 2012-04-13 MED ORDER — NEOSTIGMINE METHYLSULFATE 1 MG/ML IJ SOLN
INTRAMUSCULAR | Status: DC | PRN
  Administered 2012-04-13: 3.5 mg via INTRAVENOUS

## 2012-04-13 MED ORDER — SODIUM CHLORIDE 0.9 % IJ SOLN: 3.0000 mL | INTRAMUSCULAR | Status: DC | PRN

## 2012-04-13 MED ORDER — METHOCARBAMOL 100 MG/ML IJ SOLN
500.0000 mg | Freq: Four times a day (QID) | INTRAVENOUS | Status: DC | PRN
  Administered 2012-04-13: 500 mg via INTRAVENOUS
  Filled 2012-04-13 (×2): qty 5

## 2012-04-13 MED ORDER — FENTANYL CITRATE 0.05 MG/ML IJ SOLN
INTRAMUSCULAR | Status: DC | PRN
  Administered 2012-04-13 (×2): 100 ug via INTRAVENOUS
  Administered 2012-04-13: 50 ug via INTRAVENOUS

## 2012-04-13 MED ORDER — HYDROMORPHONE HCL PF 1 MG/ML IJ SOLN: 0.2500 mg | INTRAMUSCULAR | Status: DC | PRN

## 2012-04-13 MED ORDER — GLIPIZIDE ER 5 MG PO TB24
5.0000 mg | ORAL_TABLET | Freq: Two times a day (BID) | ORAL | Status: DC
  Administered 2012-04-13 – 2012-04-14 (×2): 5 mg via ORAL
  Filled 2012-04-13 (×4): qty 1

## 2012-04-13 MED ORDER — ONDANSETRON HCL 4 MG/2ML IJ SOLN: 4.0000 mg | INTRAMUSCULAR | Status: DC | PRN

## 2012-04-13 MED ORDER — LACTATED RINGERS IV SOLN: INTRAVENOUS | Status: DC

## 2012-04-13 MED ORDER — INSULIN ASPART 100 UNIT/ML ~~LOC~~ SOLN
0.0000 [IU] | Freq: Three times a day (TID) | SUBCUTANEOUS | Status: DC
  Administered 2012-04-13 – 2012-04-14 (×2): 3 [IU] via SUBCUTANEOUS

## 2012-04-13 MED ORDER — DOCUSATE SODIUM 100 MG PO CAPS
100.0000 mg | ORAL_CAPSULE | Freq: Two times a day (BID) | ORAL | Status: DC
  Administered 2012-04-13 – 2012-04-14 (×2): 100 mg via ORAL

## 2012-04-13 MED ORDER — LIDOCAINE HCL (CARDIAC) 20 MG/ML IV SOLN
INTRAVENOUS | Status: DC | PRN
  Administered 2012-04-13: 100 mg via INTRAVENOUS

## 2012-04-13 MED ORDER — LOSARTAN POTASSIUM 50 MG PO TABS
50.0000 mg | ORAL_TABLET | Freq: Every evening | ORAL | Status: DC
  Administered 2012-04-13: 50 mg via ORAL
  Filled 2012-04-13 (×2): qty 1

## 2012-04-13 MED ORDER — ONDANSETRON HCL 4 MG/2ML IJ SOLN
INTRAMUSCULAR | Status: DC | PRN
  Administered 2012-04-13: 4 mg via INTRAVENOUS

## 2012-04-13 MED ORDER — PROMETHAZINE HCL 25 MG/ML IJ SOLN: 6.2500 mg | INTRAMUSCULAR | Status: DC | PRN

## 2012-04-13 MED ORDER — ACETAMINOPHEN 10 MG/ML IV SOLN
INTRAVENOUS | Status: DC | PRN
  Administered 2012-04-13: 1000 mg via INTRAVENOUS

## 2012-04-13 MED ORDER — ACETAMINOPHEN 325 MG PO TABS: 650.0000 mg | ORAL_TABLET | ORAL | Status: DC | PRN

## 2012-04-13 MED ORDER — OXYCODONE-ACETAMINOPHEN 5-325 MG PO TABS
1.0000 | ORAL_TABLET | ORAL | Status: DC | PRN
  Administered 2012-04-14: 1 via ORAL
  Filled 2012-04-13: qty 1

## 2012-04-13 MED ORDER — SODIUM CHLORIDE 0.9 % IR SOLN
Status: DC | PRN
  Administered 2012-04-13: 11:00:00

## 2012-04-13 MED ORDER — PROPOFOL 10 MG/ML IV BOLUS
INTRAVENOUS | Status: DC | PRN
  Administered 2012-04-13: 200 mg via INTRAVENOUS

## 2012-04-13 MED ORDER — SUCCINYLCHOLINE CHLORIDE 20 MG/ML IJ SOLN
INTRAMUSCULAR | Status: DC | PRN
  Administered 2012-04-13: 160 mg via INTRAVENOUS

## 2012-04-13 SURGICAL SUPPLY — 44 items
APL SKNCLS STERI-STRIP NONHPOA (GAUZE/BANDAGES/DRESSINGS) ×1
BAG SPEC THK2 15X12 ZIP CLS (MISCELLANEOUS) ×1
BAG ZIPLOCK 12X15 (MISCELLANEOUS) ×2 IMPLANT
BENZOIN TINCTURE PRP APPL 2/3 (GAUZE/BANDAGES/DRESSINGS) ×3 IMPLANT
CLEANER TIP ELECTROSURG 2X2 (MISCELLANEOUS) ×2 IMPLANT
CLOTH BEACON ORANGE TIMEOUT ST (SAFETY) ×2 IMPLANT
DECANTER SPIKE VIAL GLASS SM (MISCELLANEOUS) ×2 IMPLANT
DRAPE MICROSCOPE LEICA (MISCELLANEOUS) ×2 IMPLANT
DRAPE POUCH INSTRU U-SHP 10X18 (DRAPES) ×2 IMPLANT
DRAPE SURG 17X11 SM STRL (DRAPES) ×2 IMPLANT
DRSG AQUACEL AG ADV 3.5X 6 (GAUZE/BANDAGES/DRESSINGS) ×1 IMPLANT
DURAPREP 26ML APPLICATOR (WOUND CARE) ×2 IMPLANT
ELECT REM PT RETURN 9FT ADLT (ELECTROSURGICAL) ×2
ELECTRODE REM PT RTRN 9FT ADLT (ELECTROSURGICAL) ×1 IMPLANT
GLOVE BIOGEL PI IND STRL 7.5 (GLOVE) ×1 IMPLANT
GLOVE BIOGEL PI INDICATOR 7.5 (GLOVE) ×1
GLOVE SURG SS PI 7.5 STRL IVOR (GLOVE) ×2 IMPLANT
GLOVE SURG SS PI 8.0 STRL IVOR (GLOVE) ×4 IMPLANT
GOWN PREVENTION PLUS LG XLONG (DISPOSABLE) ×2 IMPLANT
GOWN STRL REIN XL XLG (GOWN DISPOSABLE) ×4 IMPLANT
KIT BASIN OR (CUSTOM PROCEDURE TRAY) ×2 IMPLANT
KIT POSITIONING SURG ANDREWS (MISCELLANEOUS) ×2 IMPLANT
MANIFOLD NEPTUNE II (INSTRUMENTS) ×2 IMPLANT
NDL SPNL 18GX3.5 QUINCKE PK (NEEDLE) ×3 IMPLANT
NEEDLE SPNL 18GX3.5 QUINCKE PK (NEEDLE) ×6 IMPLANT
PATTIES SURGICAL .5 X.5 (GAUZE/BANDAGES/DRESSINGS) IMPLANT
PATTIES SURGICAL .75X.75 (GAUZE/BANDAGES/DRESSINGS) IMPLANT
PATTIES SURGICAL 1X1 (DISPOSABLE) IMPLANT
SPONGE SURGIFOAM ABS GEL 100 (HEMOSTASIS) ×2 IMPLANT
STAPLER VISISTAT 35W (STAPLE) ×1 IMPLANT
SUT PROLENE 3 0 PS 2 (SUTURE) IMPLANT
SUT VIC AB 0 CT1 27 (SUTURE)
SUT VIC AB 0 CT1 27XBRD ANTBC (SUTURE) IMPLANT
SUT VIC AB 1 CT1 27 (SUTURE)
SUT VIC AB 1 CT1 27XBRD ANTBC (SUTURE) ×1 IMPLANT
SUT VIC AB 1-0 CT2 27 (SUTURE) ×2 IMPLANT
SUT VIC AB 2-0 CT1 27 (SUTURE)
SUT VIC AB 2-0 CT1 TAPERPNT 27 (SUTURE) ×1 IMPLANT
SUT VIC AB 2-0 CT2 27 (SUTURE) ×4 IMPLANT
SUT VICRYL 0 27 CT2 27 ABS (SUTURE) ×2 IMPLANT
SUT VICRYL 0 UR6 27IN ABS (SUTURE) IMPLANT
SYRINGE 10CC LL (SYRINGE) ×3 IMPLANT
TRAY LAMINECTOMY (CUSTOM PROCEDURE TRAY) ×2 IMPLANT
YANKAUER SUCT BULB TIP NO VENT (SUCTIONS) ×2 IMPLANT

## 2012-04-13 NOTE — Anesthesia Procedure Notes (Signed)
Procedures

## 2012-04-13 NOTE — Transfer of Care (Signed)
Immediate Anesthesia Transfer of Care Note  Patient: [name]  Procedure(s) Performed: Procedure(s) (LRB) with comments: LUMBAR LAMINECTOMY/DECOMPRESSION MICRODISCECTOMY 1 LEVEL (Bilateral) - L4-L5  Patient Location: PACU  Anesthesia Type:General  Level of Consciousness: awake, alert  and oriented  Airway & Oxygen Therapy: Patient Spontanous Breathing and Patient connected to face mask oxygen  Post-op Assessment: Report given to PACU RN, Post -op Vital signs reviewed and stable and Patient moving all extremities  Post vital signs: Reviewed and stable  Complications: No apparent anesthesia complications

## 2012-04-13 NOTE — Anesthesia Preprocedure Evaluation (Addendum)
Anesthesia Evaluation  Patient identified by MRN, date of birth, ID band Patient awake    Reviewed: Allergy & Precautions, H&P , NPO status , Patient's Chart, lab work & pertinent test results  Airway Mallampati: II TM Distance: >3 FB Neck ROM: Full    Dental No notable dental hx.    Pulmonary neg pulmonary ROS,  breath sounds clear to auscultation  Pulmonary exam normal       Cardiovascular hypertension, Pt. on medications + CAD and + Cardiac Stents (2000) negative cardio ROS  Rhythm:Regular Rate:Normal     Neuro/Psych negative neurological ROS  negative psych ROS   GI/Hepatic negative GI ROS, Neg liver ROS,   Endo/Other  negative endocrine ROSdiabetes, Type 2, Oral Hypoglycemic Agents  Renal/GU negative Renal ROS  negative genitourinary   Musculoskeletal negative musculoskeletal ROS (+)   Abdominal   Peds negative pediatric ROS (+)  Hematology negative hematology ROS (+)   Anesthesia Other Findings   Reproductive/Obstetrics negative OB ROS                          Anesthesia Physical Anesthesia Plan  ASA: II  Anesthesia Plan: General   Post-op Pain Management:    Induction: Intravenous  Airway Management Planned: Oral ETT  Additional Equipment:   Intra-op Plan:   Post-operative Plan: Extubation in OR  Informed Consent: I have reviewed the patients History and Physical, chart, labs and discussed the procedure including the risks, benefits and alternatives for the proposed anesthesia with the patient or authorized representative who has indicated his/her understanding and acceptance.   Dental advisory given  Plan Discussed with: CRNA  Anesthesia Plan Comments:         Anesthesia Quick Evaluation

## 2012-04-13 NOTE — Op Note (Signed)
NAMESREEKAR, BROYHILL                ACCOUNT NO.:  [id number]  MEDICAL RECORD NO.:  [id number]  LOCATION:  1612                         FACILITY:  [facility]  PHYSICIAN:  Jene Every, M.D.    DATE OF BIRTH:  February 05, 1951  DATE OF PROCEDURE:  04/13/2012 DATE OF DISCHARGE:                              OPERATIVE REPORT   PREOPERATIVE DIAGNOSIS: 1. Spinal stenosis, lateral recess stenosis at L4-5. 2. Morbid obesity with a BMI of 38.  POSTOPERATIVE DIAGNOSIS: 1. Spinal stenosis, lateral recess stenosis at L4-5. 2. Morbid obesity.  PROCEDURE PERFORMED: 1. Lumbar decompression L4-5 with bilateral hemilaminotomies,     foraminotomies L4-L5. 2. Technical difficulty increased due to the obesity of the patient by     increasing the operative time and retractors and multiple layers of     closure.  ANESTHESIA:  General.  ASSISTANT:  Lanna Poche, PA.  HISTORY:  This is a 62 year old who is having predominantly right lower extremity radicular pain, although had EHL weakness bilaterally with neural tension signs and EHL weakness with MRI myelogram demonstrating compression of the 5 root at 4, 5, multifactorial.  He had a work related injury with nerve compression.  He was indicated for decompression.  Risk and benefits discussed including bleeding, infection, damage to neurovascular structure, DVT, PE, and anesthetic complications, etc.  TECHNIQUE:  With the patient in supine position, after induction of adequate general anesthesia, 2 g Kefzol placed prone on the Crockett frame.  All bony prominences well padded.  Foley to gravity, lumbar region was prepped and draped in the usual sterile fashion.  Two 18- gauge spinal needle was utilized, localized 4-5 interspace confirmed with x-ray.  Incision was made from spinous process 4 to 5. Subcutaneous tissue was dissected by electrocautery to achieve hemostasis.  Dorsolumbar fascia identified and divided in line with skin incision.   Paraspinous muscle elevated from lamina 4 and 5 bilaterally. Operating microscope draped and brought on the surgical field.  The patient had significant obesity with the BMI close to 38.  He was over 38.  Long retractors were utilized.  Operative microscope was brought on the surgical field.  Confirmatory radiograph obtained.  Small interlaminar window bilaterally.  We therefore decided to proceed essentially to avoid sacrificing the facets.  A Beyer rongeur was utilized to remove the interspinous ligament and a small portion of spinous process of L4.  Next, hemilaminotomy of the caudad edge of 4 was performed with 2 and 3 mm Kerrison preserving the pars detached ligamentum flavum.  Ligamentum flavum detached from the cephalad edge of 5 utilizing 2 mm Kerrison and neuro patties placed beneath the ligamentum flavum.  Severe lateral recess stenosis was noted bilaterally multifactorial.  Nurolon was well protected.  We first decompressed the lateral recess on the left at 4-5.  I performed foraminotomies of 4 and 5 due to the neural compression.  Then, we proceeded to the right where there was greater compression, mobilized the thecal sac, protected the nerve root.  We performed foraminotomies of 4 and 5.  Severe lateral recess stenosis and compression of the 5 root was noted as well as an epidural venous plexus, which was cauterized.  Following the foraminotomy,  the disk was examined bilaterally.  There was no evidence of disk herniation.  We had a 1 cm excursion of the 5 root __________ pedicle without tension.  Wound was copiously irrigated.  We obtained confirmatory radiographs.  We placed a hockey-stick probe down the foramen of 5 and 4, passed the pedicle 4 and 5 without compression noted.  Inspection revealed no CSF leakage.  We therefore removed all instrumentation.  Placed thrombin-soaked Gelfoam in the laminotomy defect and repaired the dorsolumbar fascia with 1 Vicryl  interrupted figure-of-8 sutures, subcu with 2-0 Vicryl simple sutures.  Skin was reapproximated with staples.  Wound was dressed sterilely.  Placed supine on hospital bed, extubated without difficulty, and transported to the recovery room in satisfactory condition.  The patient tolerated the procedure well.  No complications.  Minimal blood loss.     Jene Every, M.D.     Cordelia Pen  D:  04/13/2012  T:  04/13/2012  Job:  [id number]

## 2012-04-13 NOTE — Anesthesia Postprocedure Evaluation (Signed)
  Anesthesia Post-op Note  Patient: [name]  Procedure(s) Performed: Procedure(s) (LRB): LUMBAR LAMINECTOMY/DECOMPRESSION MICRODISCECTOMY 1 LEVEL (Bilateral)  Patient Location: PACU  Anesthesia Type: General  Level of Consciousness: awake and alert   Airway and Oxygen Therapy: Patient Spontanous Breathing  Post-op Pain: mild  Post-op Assessment: Post-op Vital signs reviewed, Patient's Cardiovascular Status Stable, Respiratory Function Stable, Patent Airway and No signs of Nausea or vomiting  Last Vitals:  Filed Vitals:   04/13/12 1230  BP: 141/72  Pulse: 75  Temp:   Resp: 8    Post-op Vital Signs: stable   Complications: No apparent anesthesia complications

## 2012-04-13 NOTE — Brief Op Note (Signed)
04/13/2012  11:43 AM  PATIENT:  Melvyn Novas  62 y.o. male  PRE-OPERATIVE DIAGNOSIS:  STENOSIS L4-5 RIGHT SIDE  POST-OPERATIVE DIAGNOSIS:  STENOSIS L4-5 RIGHT SIDE  PROCEDURE:  Procedure(s) (LRB) with comments: LUMBAR LAMINECTOMY/DECOMPRESSION MICRODISCECTOMY 1 LEVEL (Bilateral) - L4-L5  SURGEON:  Surgeon(s) and Role:    * Javier Docker, MD - Primary  PHYSICIAN ASSISTANT:   ASSISTANTS: Bissell   ANESTHESIA:   spinal and general  EBL:  Total I/O In: 1000 [I.V.:1000] Out: 145 [Urine:145]  BLOOD ADMINISTERED:none  DRAINS: none   LOCAL MEDICATIONS USED:  MARCAINE     SPECIMEN:  No Specimen  DISPOSITION OF SPECIMEN:  N/A  COUNTS:  YES  TOURNIQUET:  * No tourniquets in log *  DICTATION: .Other Dictation: Dictation Number [id number]  PLAN OF CARE: Admit for overnight observation  PATIENT DISPOSITION:  PACU - hemodynamically stable.   Delay start of Pharmacological VTE agent (>24hrs) due to surgical blood loss or risk of bleeding: yes

## 2012-04-13 NOTE — H&P (Signed)
[name] is an 62 y.o. male.   Chief Complaint: Right leg pain HPI: HNP stenosis L45 refractory  Past Medical History  Diagnosis Date  . Hypertension   . Hyperlipidemia   . Stented coronary artery 2000    X 1 STENT  . Coronary artery disease     [facility]  . Numbness in right leg     due to back  . Arthritis   . Spinal stenosis   . Skin cancer     removed l arm  . Diabetes mellitus without complication     Past Surgical History  Procedure Date  . Cholecystectomy   . Joint replacement     RT KNEE  . Uvulectomy 1990's    History reviewed. No pertinent family history. Social History:  reports that he has never smoked. He does not have any smokeless tobacco history on file. He reports that he drinks alcohol. He reports that he does not use illicit drugs.  Allergies:  Allergies  Allergen Reactions  . Toprol Xl (Metoprolol Tartrate) Other (See Comments)    Cant remember  . Morphine And Related Itching and Rash    Medications Prior to Admission  Medication Sig Dispense Refill  . atorvastatin (LIPITOR) 40 MG tablet Take 40 mg by mouth every evening.      Marland Kitchen glipiZIDE (GLUCOTROL XL) 5 MG 24 hr tablet Take 5 mg by mouth 2 (two) times daily.      Marland Kitchen HYDROcodone-acetaminophen (NORCO) 7.5-325 MG per tablet Take 1 tablet by mouth every 6 (six) hours as needed. Pain      . losartan (COZAAR) 50 MG tablet Take 50 mg by mouth every evening.      . metFORMIN (GLUCOPHAGE) 1000 MG tablet Take 1,000 mg by mouth 2 (two) times daily with a meal.      . naproxen sodium (ANAPROX) 220 MG tablet Take 220 mg by mouth 2 (two) times daily with a meal.      . aspirin 325 MG tablet Take 325 mg by mouth daily.        Results for orders placed during the hospital encounter of 04/13/12 (from the past 48 hour(s))  GLUCOSE, CAPILLARY     Status: Abnormal   Collection Time   04/13/12  8:30 AM      Component Value Range Comment   Glucose-Capillary 218 (*) 70 - 99 mg/dL    No  results found.  Review of Systems  Neurological: Positive for sensory change and focal weakness.  All other systems reviewed and are negative.    Blood pressure 144/78, pulse 92, temperature 97.2 F (36.2 C), resp. rate 20, SpO2 100.00%. Physical Exam  Constitutional: He appears well-developed.  HENT:  Head: Normocephalic.  Eyes: Pupils are equal, round, and reactive to light.  Neck: Normal range of motion.  Cardiovascular: Normal rate.   Respiratory: Effort normal.  GI: Soft.  Musculoskeletal:       +SLR right. EHL 5-/5 No DVT  Neurological: He is alert.  Skin: Skin is warm.  Psychiatric: He has a normal mood and affect.   MRI HNP stenosis L45  Assessment/Plan L45 radiculopathy due to HNP stenosis L45 refractory. Plan decompression L45. Risks discussed.  [name] C 04/13/2012, 9:36 AM

## 2012-04-13 NOTE — Care Management Note (Addendum)
    Page 1 of 2   04/14/2012     1:01:47 PM   CARE MANAGEMENT NOTE 04/14/2012  Patient:  [name], [name]   Account Number:  [id number]  Date Initiated:  04/13/2012  Documentation initiated by:  Colleen Can  Subjective/Objective Assessment:   DX LUMBAR LAMINECTOMY/DECOMPRESSION MICRO DISESCTOMY- 1 LEVEL     Action/Plan:   CM spoke with patient. Plans are for patient to return to his home where spouse will be caregiver. States walking without difficulty. No HH or DME neds assessed at this time   Anticipated DC Date:  04/14/2012   Anticipated DC Plan:  HOME/SELF CARE  In-house referral  NA      DC Planning Services  CM consult      PAC Choice  NA   Choice offered to / List presented to:  NA   DME arranged  NA      DME agency  NA     HH arranged  NA      HH agency  NA   Status of service:  Completed, signed off Medicare Important Message given?  NO (If response is "NO", the following Medicare IM given date fields will be blank) Date Medicare IM given:   Date Additional Medicare IM given:    Discharge Disposition:  HOME/SELF CARE  Per UR Regulation:  Reviewed for med. necessity/level of care/duration of stay  If discussed at Long Length of Stay Meetings, dates discussed:    Comments:  04/13/2012 Colleen Can BSN RN CCM [phone] Worker's comp [id number]   date of injury 11/24/10 [facility] AdjusterMarcelino Duster Paulis ph# [phone]

## 2012-04-14 ENCOUNTER — Encounter (HOSPITAL_COMMUNITY): Payer: Self-pay | Admitting: Specialist

## 2012-04-14 LAB — BASIC METABOLIC PANEL
CO2: 26 mEq/L (ref 19–32)
Calcium: 8.8 mg/dL (ref 8.4–10.5)
Chloride: 103 mEq/L (ref 96–112)
Sodium: 137 mEq/L (ref 135–145)

## 2012-04-14 LAB — CBC
Hemoglobin: 11 g/dL — ABNORMAL LOW (ref 13.0–17.0)
MCH: 28.9 pg (ref 26.0–34.0)
MCHC: 33.8 g/dL (ref 30.0–36.0)
RDW: 14.4 % (ref 11.5–15.5)
WBC: 8.9 10*3/uL (ref 4.0–10.5)

## 2012-04-14 LAB — GLUCOSE, CAPILLARY: Glucose-Capillary: 142 mg/dL — ABNORMAL HIGH (ref 70–99)

## 2012-04-14 MED ORDER — ASPIRIN 325 MG PO TABS: 325.0000 mg | ORAL_TABLET | Freq: Every day | ORAL | Status: DC

## 2012-04-14 NOTE — Progress Notes (Addendum)
Subjective: 1 Day Post-Op Procedure(s) (LRB): LUMBAR LAMINECTOMY/DECOMPRESSION MICRODISCECTOMY 1 LEVEL (Bilateral) Patient reports pain as mild.  Minimal incisional back pain. Denies leg pain, numbness, tingling. Voiding without difficulty.  Objective: Vital signs in last 24 hours: Temp:  [97.2 F (36.2 C)-98.6 F (37 C)] 97.8 F (36.6 C) (01/30 0411) Pulse Rate:  [70-101] 87  (01/30 0411) Resp:  [8-20] 20  (01/30 0411) BP: (127-169)/(51-78) 135/66 mmHg (01/30 0411) SpO2:  [97 %-100 %] 100 % (01/30 0411) FiO2 (%):  [100 %] 100 % (01/29 1430) Weight:  [131.09 kg (289 lb)] 131.09 kg (289 lb) (01/29 1430)  Intake/Output from previous day: 01/29 0701 - 01/30 0700 In: 1209.8 [I.V.:1209.8] Out: 2245 [Urine:2245] Intake/Output this shift:     Basename 04/14/12 0415  HGB 11.0*    Basename 04/14/12 0415  WBC 8.9  RBC 3.81*  HCT 32.5*  PLT 166    Basename 04/14/12 0415  NA 137  K 4.2  CL 103  CO2 26  BUN 10  CREATININE 0.85  GLUCOSE 205*  CALCIUM 8.8   No results found for this basename: LABPT:2,INR:2 in the last 72 hours  Neurologically intact Neurovascular intact Sensation intact distally Intact pulses distally Dorsiflexion/Plantar flexion intact Incision: no drainage and distal dressing rolled up No cellulitis present Compartment soft No calf pain or sign of DVT  Assessment/Plan: 1 Day Post-Op Procedure(s) (LRB): LUMBAR LAMINECTOMY/DECOMPRESSION MICRODISCECTOMY 1 LEVEL (Bilateral) Advance diet Up with therapy Plan to D/C home later today Dressing change prior to D/C with new aquacel dressing Discussed D/C instructions, post-op precautions Discussed with Dr. Elissa Lovett, Dayna Barker. 04/14/2012, 7:39 AM

## 2012-04-14 NOTE — Progress Notes (Signed)
Pt discharged home via family; Pt and family given and explained all discharge instructions, carenotes, and prescriptions; pt and family stated understanding and denied questions/concerns; all f/u appointments in place; IV removed without complicaitons; pt stable at time of discharge  

## 2012-04-14 NOTE — Evaluation (Signed)
Physical Therapy Evaluation Patient Details Name: [name] MRN: [id number] DOB: 04-Jul-1950 Today's Date: 04/14/2012 Time: 7829-5621 PT Time Calculation (min): 20 min  PT Assessment / Plan / Recommendation Clinical Impression  62 yo male s/p decompression L4-L5, bil hemilaminotomies. Completed all education. Issued back handout. On eval, pt was supervision assist level for all mobility. No follow up PT needs. 1x eval. Pt planning to d/c home today.     PT Assessment  Patent does not need any further PT services    Follow Up Recommendations  No PT follow up    Does the patient have the potential to tolerate intense rehabilitation      Barriers to Discharge        Equipment Recommendations  None recommended by PT    Recommendations for Other Services OT consult   Frequency      Precautions / Restrictions Precautions Precautions: Back Precaution Booklet Issued: Yes (comment) Precaution Comments: Verbally reviewed back precautions and logroll technique Restrictions Weight Bearing Restrictions: No   Pertinent Vitals/Pain 2/10 back      Mobility  Bed Mobility Bed Mobility: Rolling Left;Left Sidelying to Sit Rolling Left: 5: Supervision Left Sidelying to Sit: 5: Supervision;HOB flat Details for Bed Mobility Assistance: VCS safety, technique, hand placement, adherence to precautions Transfers Transfers: Sit to Stand;Stand to Sit Sit to Stand: 5: Supervision;From bed Stand to Sit: 5: Supervision;To chair/3-in-1 Details for Transfer Assistance: VCs safety.  Ambulation/Gait Ambulation/Gait Assistance: 5: Supervision Ambulation Distance (Feet): 150 Feet Assistive device: None Stairs: Yes Stairs Assistance: 5: Supervision Stairs Assistance Details (indicate cue type and reason): VCs safety, technique. Pt able to ascend using alternating pattern, and descend using step to pattern. Encouraged use of step to  pattern if needed.  Stair Management Technique: Forwards;Step  to pattern Number of Stairs: 4     Shoulder Instructions     Exercises     PT Diagnosis:    PT Problem List:   PT Treatment Interventions:     PT Goals    Visit Information  Last PT Received On: 04/14/12 Assistance Needed: +1 (Simultaneous filing. User may not have seen previous data.) PT/OT Co-Evaluation/Treatment: Yes    Subjective Data  Subjective: "I feel good" Patient Stated Goal: home today   Prior Functioning  Home Living Lives With: Spouse Available Help at Discharge: Family Type of Home: House Home Access: Stairs to enter Secretary/administrator of Steps: 3 Entrance Stairs-Rails: None Home Layout: One level Bathroom Shower/Tub: Health visitor: Standard Home Adaptive Equipment: Environmental consultant - rolling Prior Function Level of Independence: Independent Able to Take Stairs?: Yes Driving: Yes Vocation: Retired Musician: No difficulties Dominant Hand: Right    Cognition  Overall Cognitive Status: Appears within functional limits for tasks assessed/performed Arousal/Alertness: Awake/alert Orientation Level: Appears intact for tasks assessed Behavior During Session: [facility] for tasks performed    Extremity/Trunk Assessment Right Upper Extremity Assessment RUE ROM/Strength/Tone: [facility] for tasks assessed Left Upper Extremity Assessment LUE ROM/Strength/Tone: WFL for tasks assessed   Balance    End of Session PT - End of Session Activity Tolerance: Patient tolerated treatment well Patient left: in chair  GP Functional Assessment Tool Used: clinical judgement Functional Limitation: Mobility: Walking and moving around Mobility: Walking and Moving Around Current Status (H0865): At least 1 percent but less than 20 percent impaired, limited or restricted Mobility: Walking and Moving Around Goal Status [phone]): At least 1 percent but less than 20 percent impaired, limited or restricted Mobility: Walking and Moving Around Discharge Status  (  Z6109): At least 1 percent but less than 20 percent impaired, limited or restricted   [name] [name] 04/14/2012, 9:53 AM [phone]

## 2012-04-14 NOTE — Evaluation (Signed)
Occupational Therapy Evaluation Patient Details Name: [name] MRN: [id number] DOB: 06-Jan-1951 Today's Date: 04/14/2012 Time: 4098-1191 OT Time Calculation (min): 20 min  OT Assessment / Plan / Recommendation Clinical Impression  Pt doing well POD 1 LUMBAR LAMINECTOMY/DECOMPRESSION MICRODISCECTOMY 1 LEVEL. All education completed. Pt will have necessary level of A from family upon d/c.    OT Assessment  Patient does not need any further OT services    Follow Up Recommendations  No OT follow up    Barriers to Discharge      Equipment Recommendations  None recommended by OT    Recommendations for Other Services    Frequency       Precautions / Restrictions Precautions Precautions: Back Precaution Booklet Issued: Yes (comment) Precaution Comments: Verbally reviewed back precautions and logroll technique Restrictions Weight Bearing Restrictions: No   Pertinent Vitals/Pain Pt reported 2/10 pain with activity. Repositioned for comfort.    ADL  Grooming: Supervision/safety Where Assessed - Grooming: Unsupported standing Upper Body Bathing: Set up Where Assessed - Upper Body Bathing: Unsupported sitting Lower Body Bathing: Minimal assistance Where Assessed - Lower Body Bathing: Unsupported sit to stand Upper Body Dressing: Set up Where Assessed - Upper Body Dressing: Unsupported sitting Lower Body Dressing: Minimal assistance Where Assessed - Lower Body Dressing: Unsupported sit to stand Toilet Transfer: Supervision/safety Toilet Transfer Method: Sit to Barista: Comfort height toilet;Grab bars Toileting - Architect and Hygiene: Supervision/safety Where Assessed - Engineer, mining and Hygiene: Sit to stand from 3-in-1 or toilet Tub/Shower Transfer: Supervision/safety Tub/Shower Transfer Method: Science writer: Walk in shower Transfers/Ambulation Related to ADLs: Pt ambulated to the bathroom  with supervision. ADL Comments: Pt unable to cross ankle over opposite knee. Declined ed in AE stating someone would be able to assist him at home.    OT Diagnosis:    OT Problem List:   OT Treatment Interventions:     OT Goals    Visit Information  Last OT Received On: 04/14/12 Assistance Needed: +1 (Simultaneous filing. User may not have seen previous data.)    Subjective Data  Subjective: I fell at work. Patient Stated Goal: Not asked.   Prior Functioning     Home Living Lives With: Spouse Available Help at Discharge: Family Type of Home: House Home Access: Stairs to enter Secretary/administrator of Steps: 3 Entrance Stairs-Rails: None Home Layout: One level Bathroom Shower/Tub: Health visitor: Standard Home Adaptive Equipment: Environmental consultant - rolling Prior Function Level of Independence: Independent Able to Take Stairs?: Yes Driving: Yes Vocation: Retired Musician: No difficulties Dominant Hand: Right         Vision/Perception     Cognition  Overall Cognitive Status: Appears within functional limits for tasks assessed/performed Arousal/Alertness: Awake/alert Orientation Level: Appears intact for tasks assessed Behavior During Session: [facility] for tasks performed    Extremity/Trunk Assessment Right Upper Extremity Assessment RUE ROM/Strength/Tone: [facility] for tasks assessed Left Upper Extremity Assessment LUE ROM/Strength/Tone: WFL for tasks assessed     Mobility Bed Mobility Bed Mobility: Rolling Left;Left Sidelying to Sit Rolling Left: 5: Supervision Left Sidelying to Sit: 5: Supervision;HOB flat Details for Bed Mobility Assistance: VCS safety, technique, hand placement, adherence to precautions Transfers Transfers: Sit to Stand;Stand to Sit Sit to Stand: 5: Supervision;From bed Stand to Sit: 5: Supervision;To chair/3-in-1 Details for Transfer Assistance: VCs safety.      Shoulder Instructions     Exercise      Balance     End of  Session OT - End of Session Activity Tolerance: Patient tolerated treatment well Patient left: in chair;with call bell/phone within reach  GO Functional Assessment Tool Used: Clinical Judgement Functional Limitation: Self care Self Care Current Status ([id number]): At least 1 percent but less than 20 percent impaired, limited or restricted Self Care Goal Status (I3474): At least 1 percent but less than 20 percent impaired, limited or restricted Self Care Discharge Status [phone]): At least 1 percent but less than 20 percent impaired, limited or restricted   Einer Meals A OTR/L [phone] 04/14/2012, 9:53 AM

## 2012-04-14 NOTE — Discharge Summary (Signed)
Physician Discharge Summary   Patient ID: [name] MRN: [id number] DOB/AGE: 1951-02-05 62 y.o.  Admit date: 04/13/2012 Discharge date: 04/14/2012  Primary Diagnosis:   STENOSIS L4-5 RIGHT SIDE  Admission Diagnoses:  Past Medical History  Diagnosis Date  . Hypertension   . Hyperlipidemia   . Stented coronary artery 2000    X 1 STENT  . Coronary artery disease     [facility]. [facility]  . Numbness in right leg     due to back  . Arthritis   . Spinal stenosis   . Skin cancer     removed l arm  . Diabetes mellitus without complication    Discharge Diagnoses:   Principal Problem:  *Lumbar spinal stenosis  Procedure:  Procedure(s) (LRB): LUMBAR LAMINECTOMY/DECOMPRESSION MICRODISCECTOMY 1 LEVEL (Bilateral)   Consults: None  HPI:  See H&P- lower back and right leg pain refractory to conservative tx    Laboratory Data: Hospital Outpatient Visit on 04/06/2012  Component Date Value Range Status  . Sodium 04/06/2012 135  135 - 145 mEq/L Final  . Potassium 04/06/2012 4.4  3.5 - 5.1 mEq/L Final  . Chloride 04/06/2012 99  96 - 112 mEq/L Final  . CO2 04/06/2012 25  19 - 32 mEq/L Final  . Glucose, Bld 04/06/2012 236* 70 - 99 mg/dL Final  . BUN 40/98/1191 20  6 - 23 mg/dL Final  . Creatinine, Ser 04/06/2012 0.82  0.50 - 1.35 mg/dL Final  . Calcium 47/82/9562 9.3  8.4 - 10.5 mg/dL Final  . GFR calc non Af Amer 04/06/2012 >90  >90 mL/min Final  . GFR calc Af Amer 04/06/2012 >90  >90 mL/min Final   Comment:                                 The eGFR has been calculated                          using the CKD EPI equation.                          This calculation has not been                          validated in all clinical                          situations.                          eGFR's persistently                          <90 mL/min signify                          possible Chronic Kidney Disease.  . WBC 04/06/2012 7.8  4.0 - 10.5 K/uL Final  . RBC  04/06/2012 4.48  4.22 - 5.81 MIL/uL Final  . Hemoglobin 04/06/2012 12.7* 13.0 - 17.0 g/dL Final  . HCT 13/10/6576 38.3* 39.0 - 52.0 % Final  . MCV 04/06/2012 85.5  78.0 - 100.0 fL Final  . MCH 04/06/2012 28.3  26.0 - 34.0 pg Final  . MCHC 04/06/2012 33.2  30.0 - 36.0 g/dL  Final  . RDW 04/06/2012 14.2  11.5 - 15.5 % Final  . Platelets 04/06/2012 200  150 - 400 K/uL Final  . MRSA, PCR 04/06/2012 NEGATIVE  NEGATIVE Final  . Staphylococcus aureus 04/06/2012 NEGATIVE  NEGATIVE Final   Comment:                                 The Xpert SA Assay (FDA                          approved for NASAL specimens                          in patients over 29 years of age),                          is one component of                          a comprehensive surveillance                          program.  Test performance has                          been validated by [employer] for patients greater                          than or equal to 87 year old.                          It is not intended                          to diagnose infection nor to                          guide or monitor treatment.    Basename 04/14/12 0415  HGB 11.0*    Basename 04/14/12 0415  WBC 8.9  RBC 3.81*  HCT 32.5*  PLT 166    Basename 04/14/12 0415  NA 137  K 4.2  CL 103  CO2 26  BUN 10  CREATININE 0.85  GLUCOSE 205*  CALCIUM 8.8   No results found for this basename: LABPT:2,INR:2 in the last 72 hours  X-Rays:Dg Chest 2 View  04/06/2012  *RADIOLOGY REPORT*  Clinical Data: Preop for lumbar laminectomy  CHEST - 2 VIEW  Comparison: Chest x-ray of 08/25/2006  Findings: No active infiltrate or effusion is seen.  Mediastinal contours are stable.  The heart is within upper limits of normal. There are mild degenerative changes in the lower thoracic spine. Surgical clips are present in the right upper quadrant from prior cholecystectomy.  IMPRESSION: Stable chest x-ray.  No active lung  disease.   Original Report Authenticated By: Dwyane Dee, M.D.    Dg Lumbar Spine 2-3 Views  04/06/2012  *RADIOLOGY REPORT*  Clinical Data: Preop for lumbar laminectomy  LUMBAR SPINE - 2-3 VIEW  Comparison: CT  lumbar spine of 08/26/2011  Findings: The lumbar vertebrae are unchanged in alignment.  The minimal retrolisthesis of L3 on L4 and minimal anterolisthesis of L4 on L5 both appear stable when compared to the prior CT. Degenerative disc disease again is noted primarily at L2-3 where there is loss of disc space and sclerosis with spurring.  No acute compression deformity is seen.  The SI joints are unremarkable.  IMPRESSION: No acute abnormality.  Degenerative change diffusely with degenerative disc disease at L2-3.   Original Report Authenticated By: Dwyane Dee, M.D.    Dg Spine Portable 1 View  04/13/2012  *RADIOLOGY REPORT*  Clinical Data: L4-5 decompression.  PORTABLE SPINE - 1 VIEW  Comparison: 04/13/2012  Findings: Posterior surgical instruments are directed at the inferior L4 vertebral body and the mid L5 vertebral body.  IMPRESSION: Intraoperative localization as above.   Original Report Authenticated By: Charlett Nose, M.D.    Dg Spine Portable 1 View  04/13/2012  *RADIOLOGY REPORT*  Clinical Data: L4-5 decompression.  PORTABLE SPINE - 1 VIEW  Comparison: 04/13/2012  Findings: Posterior surgical instruments are directed at the L4-5 level.  IMPRESSION: Intraoperative localization as above.   Original Report Authenticated By: Charlett Nose, M.D.    Dg Spine Portable 1 View  04/13/2012  *RADIOLOGY REPORT*  Clinical Data: L4-5 decompression.  PORTABLE SPINE - 1 VIEW  Comparison: 04/06/2012  Findings: Posterior needle is directed at the L4 spinous process near the L4-5 interspace.  IMPRESSION: Intraoperative localization as above.   Original Report Authenticated By: Charlett Nose, M.D.     EKG:No orders found for this or any previous visit.   Hospital Course: Patient was admitted to [facility] and taken to the OR and underwent the above state procedure without complications.  Patient tolerated the procedure well and was later transferred to the recovery room and then to the orthopaedic floor for postoperative care.  They were given PO and IV analgesics for pain control following their surgery.  They were given 24 hours of postoperative antibiotics.   PT was consulted postop to assist with mobility and transfers.  The patient was allowed to be WBAT with therapy and was taught back precautions. Discharge planning was consulted to help with postop disposition and equipment needs.  Patient had a comfortable night on the evening of surgery and started to get up OOB with therapy on day one. Patient was seen in rounds and was ready to go home on day one.  They were given discharge instructions and dressing directions.  They were instructed on when to follow up in the office with Dr. Shelle Iron.  Discharge Medications: Prior to Admission medications   Medication Sig Start Date End Date Taking? Authorizing Provider  atorvastatin (LIPITOR) 40 MG tablet Take 40 mg by mouth every evening.   Yes Historical Provider, MD  glipiZIDE (GLUCOTROL XL) 5 MG 24 hr tablet Take 5 mg by mouth 2 (two) times daily.   Yes Historical Provider, MD  losartan (COZAAR) 50 MG tablet Take 50 mg by mouth every evening.   Yes Historical Provider, MD  metFORMIN (GLUCOPHAGE) 1000 MG tablet Take 1,000 mg by mouth 2 (two) times daily with a meal.   Yes Historical Provider, MD  naproxen sodium (ANAPROX) 220 MG tablet Take 220 mg by mouth 2 (two) times daily with a meal.   Yes Historical Provider, MD  aspirin 325 MG tablet Take 1 tablet (325 mg total) by mouth daily. 04/17/12   Dayna Barker. Bissell, PA-C  methocarbamol (  ROBAXIN) 500 MG tablet Take 1 tablet (500 mg total) by mouth 3 (three) times daily between meals as needed. 04/13/12   Javier Docker, MD  oxyCODONE-acetaminophen (PERCOCET) 5-325 MG per tablet Take 1-2 tablets by mouth  every 4 (four) hours as needed for pain. 04/13/12   Javier Docker, MD    Diet: Diabetic diet Activity:WBAT Follow-up:in 10-14 days Disposition - Home Discharged Condition: good   Discharge Orders    Future Orders Please Complete By Expires   Diet - low sodium heart healthy      Call MD / Call 911      Comments:   If you experience chest pain or shortness of breath, CALL 911 and be transported to the hospital emergency room.  If you develope a fever above 101 F, pus (white drainage) or increased drainage or redness at the wound, or calf pain, call your surgeon's office.   Constipation Prevention      Comments:   Drink plenty of fluids.  Prune juice may be helpful.  You may use a stool softener, such as Colace (over the counter) 100 mg twice a day.  Use MiraLax (over the counter) for constipation as needed.   Increase activity slowly as tolerated          Medication List     As of 04/14/2012 12:49 PM    STOP taking these medications         HYDROcodone-acetaminophen 7.5-325 MG per tablet   Commonly known as: NORCO      TAKE these medications         aspirin 325 MG tablet   Take 1 tablet (325 mg total) by mouth daily.   Start taking on: 04/17/2012      atorvastatin 40 MG tablet   Commonly known as: LIPITOR   Take 40 mg by mouth every evening.      glipiZIDE 5 MG 24 hr tablet   Commonly known as: GLUCOTROL XL   Take 5 mg by mouth 2 (two) times daily.      losartan 50 MG tablet   Commonly known as: COZAAR   Take 50 mg by mouth every evening.      metFORMIN 1000 MG tablet   Commonly known as: GLUCOPHAGE   Take 1,000 mg by mouth 2 (two) times daily with a meal.      methocarbamol 500 MG tablet   Commonly known as: ROBAXIN   Take 1 tablet (500 mg total) by mouth 3 (three) times daily between meals as needed.      naproxen sodium 220 MG tablet   Commonly known as: ANAPROX   Take 220 mg by mouth 2 (two) times daily with a meal.      oxyCODONE-acetaminophen 5-325 MG  per tablet   Commonly known as: PERCOCET/ROXICET   Take 1-2 tablets by mouth every 4 (four) hours as needed for pain.         SignedDorothy Spark. 04/14/2012, 12:49 PM

## 2013-08-30 DIAGNOSIS — M549 Dorsalgia, unspecified: Secondary | ICD-10-CM | POA: Insufficient documentation

## 2013-08-30 DIAGNOSIS — E782 Mixed hyperlipidemia: Secondary | ICD-10-CM | POA: Insufficient documentation

## 2014-04-16 DIAGNOSIS — I2510 Atherosclerotic heart disease of native coronary artery without angina pectoris: Secondary | ICD-10-CM | POA: Insufficient documentation

## 2014-04-16 DIAGNOSIS — I5022 Chronic systolic (congestive) heart failure: Secondary | ICD-10-CM | POA: Insufficient documentation

## 2014-07-08 NOTE — Consult Note (Signed)
General Aspect acute renal insufficiency    Present Illness The patient is a 64 year old white man with a past history significant for diabetes who was admitted with pain, swelling, and redness of the left foot on 03/31/2011. He had initially noticed a blister on the foot several weeks ago. He took care of this at home and eventually improved and resolved. He subsequently began developing redness and swelling of the great toe on the left about a week ago. He has been treated with vancomycin and Zosyn. His white count has been normal during his hospitalization.Unfortunately his renal function has deteriorated and he will now need dialysis.  I am aasked to evaluate for access.  PAST MEDICAL HISTORY:  1. Diabetes.  2. Hypertension.  3. Hypercholesterolemia.  4. Coronary artery disease status post angioplasty.  5. Cardiomyopathy with an EF of 40%.  6. Hypogonadism. 7. Disk disease.   Home Medications:  aspirin 325 mg oral tablet: 1 tab(s) orally once a day , Active  losartan 50 mg oral tablet: 1 tab(s) orally once a day , Active  actos 30 mg :   once a day, Active  Lipitor 40 mg oral tablet: tab(s) orally once a day, Active   Toprol XL: Unknown  Morphine Sulfate: Itching, Rash  Baycol: Unknown  Case History:   Social History negative tobacco, negative ETOH, negative Illicit drugs   Review of Systems:   Fever/Chills No    Cough No    Sputum No    Abdominal Pain No    Diarrhea No    Constipation No    Nausea/Vomiting No    SOB/DOE No    Chest Pain No    Telemetry Reviewed NSR    Tolerating PT Yes    Tolerating Diet Yes   Physical Exam:   GEN WD, obese    HEENT PERRL, hearing intact to voice, moist oral mucosa    NECK supple  trachea midline    RESP normal resp effort  no use of accessory muscles    CARD regular rate  LE edema present  no JVD    ABD denies tenderness  denies Flank Tenderness    EXTR negative cyanosis/clubbing, positive edema, foot ulcer  wrapped    SKIN positive ulcers, skin turgor poor    NEURO cranial nerves intact, follows commands, motor/sensory function intact    PSYCH alert, A+O to time, place, person   Nursing/Ancillary Notes: **Vital Signs.:   24-Jan-13 13:40   Vital Signs Type Post-Op   Temperature Temperature (F) 98.3   Celsius 36.8   Temperature Source oral   Pulse Pulse 80   Pulse source per Dinamap   Respirations Respirations 18   Systolic BP Systolic BP 149   Diastolic BP (mmHg) Diastolic BP (mmHg) 72   Mean BP 97   BP Source Dinamap   Pulse Ox % Pulse Ox % 98   Pulse Ox Activity Level  At rest   Oxygen Delivery Room Air/ 21 %   Routine Chem:  24-Jan-13 04:41    Glucose, Serum 115   BUN 71   Creatinine (comp) 8.24   Sodium, Serum 140   Potassium, Serum 4.6   Chloride, Serum 106   CO2, Serum 19   Calcium (Total), Serum 7.5   Osmolality (calc) 301   eGFR (African American) 9   eGFR (Non-African American) 7   Anion Gap 15  TDMs:  24-Jan-13 04:41    Vancomycin, Random > 50   Radiology Results: [place]:  22-Jan-13 08:40, US Kidney Bilateral   US Kidney Bilateral    REASON FOR EXAM:    Acute Renal Failure  COMMENTS:       PROCEDURE: [place]  - US KIDNEY  - Apr 07 2011  8:40AM     RESULT:     FINDINGS: The right kidney measures 12.52 x 5.93 x 6.92 cm. There is no   evidence of hydronephrosis, solid or cystic masses orcalculi within the   right kidney. There is appropriate corticomedullary differentiation.   Evaluation of the left kidney is degraded by patient body habitus. The   patient is morbidly obese. Moderate hydronephrosis is appreciated   involving the left kidney without evidence of solid or cystic masses or   calculi. There is appropriate corticomedullary differentiation. The   urinary bladder is partially distended. Bilateral ureteral jets are     identified.    IMPRESSION:  Mild to moderate hydronephrosis involving the left kidney.   Otherwise unremarkable Renal Ultrasound.  The study is degraded secondary   to the patient's body habitus.       Thank you for this opportunity to contribute to the care of your patient.           Verified By: Mikki Santee, M.D., MD     Impression 1.  Acute renal failure          will place temp cath in the groin with ultrasound          Nephrology following 2.  Left great toe/foot diabetic foot ulcer.            s/p surgical debridement           plan per Podiatry 3.  Left lower extremity swelling with cellulitis.            continue ABx  PIC line placed           plan per ID 4.  Poorly controlled type 2 diabetes.            home meds per Medical service           continue liding scale  5.  Coronary artery disease with stent in the past and angioplasty in 2010.            continue ASA           Nitrates as needed       6.   Hypertension.            serial vitals           continue home meds 7.  Hyperlipidemia.            continue statin    Plan level 4 consult   Electronic Signatures: Hortencia Pilar (MD)  (Signed 24-Jan-13 17:54)  Authored: General Aspect/Present Illness, Home Medications, Allergies, History and Physical Exam, Vital Signs, Labs, Radiology, Impression/Plan   Last Updated: 24-Jan-13 17:54 by Hortencia Pilar (MD)

## 2014-07-08 NOTE — Consult Note (Signed)
PATIENT NAME:  [name], [name] MR#:  [id number] DATE OF BIRTH:  1951/03/13  DATE OF CONSULTATION:  04/01/2011  REFERRING PHYSICIAN:   CONSULTING PHYSICIAN:  Gerrit Heck. Sarina Robleto, DPM  REASON FOR CONSULTATION: Diabetic ulceration wound to the left great toe.   HISTORY OF PRESENT ILLNESS: The patient is a 64 year old male who was admitted to the hospital last night with cellulitis to his left great toe AND some increased swelling to his left leg. He was admitted for redness, swelling and the deep wound that he had on the distal tip of that toe. The patient states that he is not sure when he started developing problems with it, but he really only started getting major swelling yesterday and that is what prompted him to come to the emergency room. He is currently in the bed on Zosyn and vancomycin intravenously.   PAST MEDICAL HISTORY:  1. Diabetes, type 2. 2. Morbid obesity.  3. Hypertension. 4. Coronary artery disease with stenting done in 2001.  5. Cardiomyopathy with ejection fraction of 40% by echocardiogram done in July 2012. 6. History of  hypotestosteronism. 7. History of hyperlipidemia.   PAST SURGICAL HISTORY:  1. Cholecystectomy in 1985. 2. Right meniscal repair. 3. Nasopharyngeal surgery for sleep apnea. 4. Right knee replacement in 2008. 5. Cardiac catheterization with angioplasty in 2010.  ALLERGIES: Toprol-XL.   CURRENT MEDICATIONS:  1. Aspirin 325 mg daily. 2. Actos 30 mg daily.  3. Losartan 50 mg daily. 4. Lipitor 40 mg daily.   SOCIAL HISTORY: He is married. He denies EtOH. He denies smoking. He works in [place].  FAMILY HISTORY: Myocardial infarction in his father in 69. Mother is deceased with breast cancer.   PHYSICAL EXAMINATION:  GENERAL: He is an alert, well-oriented Caucasian male in no apparent distress.  VITALS: On last check his temperature was 97.6, pulse 73, respirations 20, blood pressure 118/66, and pulse oximetry 97%.   LOWER EXTREMITY EXAM:  Vascular DP and PT pulses are hard to palpate due to the swelling in that foot and leg.   DERMATOLOGIC: The patient has a wound on the distal tip of the toe that probes down to bone at the distal phalanx. The wound itself is about 1 cm deep and about 1 cm in diameter. There is some evidence of granulomas formation around the region, but some necrotic soft tissue centrally which I will debris out today with a sharp # 15 blade and excise that tissue with a sharp #15 blade that is necrotic.   I did a deep culture of the area today. I also order a bone scan on him as well. I irrigated the wound out nicely today with wound flush and put a sterile dressing and packing on the area.   ASSESSMENT AND PLAN: X-rays were reviewed and they show demineralization at the distal phalanx. Dr. Owens Shark read the x-ray and I asked him to relook at that to confirm my opinion on that. I have also ordered a bone scan, triphasic bone scan, for him tomorrow to highlight that region. I sat down and had a long discussion with the patient today about the likely need to amputate the tip of that great toe. I think we can just remove the distal phalanx and salvage the majority of his toe and will be able to function very well with that. I also told him it is probably going to be two to three months before he is going to be able to go back to work. Some of these  things are going to have to sink him with him a little bit. I will talk to him some more about that        tomorrow. He needs to discuss it with his wife. He will get the bone scan tomorrow as well. I will follow him tomorrow and likely set him up for surgery on Friday at some time. ____________________________ Gerrit Heck Dezeray Puccio, DPM mgt:slb D: 04/01/2011 13:24:19 ET T: 04/01/2011 13:34:03 ET JOB#: [id number]  cc: Gerrit Heck Eris Breck, DPM, <Dictator> Perry Mount MD ELECTRONICALLY SIGNED 04/30/2011 11:29

## 2014-07-08 NOTE — H&P (Signed)
PATIENT NAME:  [name], [name] MR#:  [id number] DATE OF BIRTH:  [birth year]  DATE OF ADMISSION:  03/31/2011  PRIMARY CARE PHYSICIAN: Juluis Pitch, MD   CARDIOLOGIST: Serafina Royals, MD   CHIEF COMPLAINT: Left great toe swelling and development of an ulcer.   HISTORY OF PRESENT ILLNESS: [name] is a 64 year old Caucasian gentleman with multiple medical problems, comes to the Emergency Room after he was sent from [facility] with left great toe diabetic foot ulcer. The patient noticed about two weeks ago a small blister on his left great toe. He tried to manipulate it by himself, and he was applying some over-the-counter ointment, thought it was healing well; however, he noticed some swelling of his left lower extremity for two days. He did see some red streaks in his left lower extremity, went to see a physician at [facility], and was found to have a diabetic foot ulcer on the great toe. He was sent to the Emergency Room and is being admitted for further evaluation and management. The patient denies any pain. He did complain of some swelling in the left foot. He received a dose of vancomycin and Zosyn in the Emergency Room.   PAST MEDICAL HISTORY:  1. Type 2 diabetes.  2. Morbid obesity.  3. Hypertension.  4. Coronary artery disease, status post stenting in 2001.  5. Cardiomyopathy with ejection fraction of 40% with hypokinesis of the inferior myocardial wall. This was by echocardiogram of July 2012. The patient did have a Myoview stress test at that time with normal treadmill EKG without evidence of ischemia or dysrhythmia.   6. History of hypotestosteronism.  7. Hyperlipidemia.   PAST SURGICAL HISTORY:  1. Cholecystectomy in 1985.  2. Right meniscus repair.  3. Nasopharyngeal surgery for sleep apnea.  4. Right knee placement in 2008.  5. Cardiac catheterization with angioplasty in February of 2010.   ALLERGIES: Toprol-XL.   MEDICATIONS ON DISCHARGE:  1. Aspirin  325 mg p.o. daily.  2. Actos 30 mg p.o. daily.  3. Losartan 50 mg daily.  4. Lipitor 40 mg daily.   SOCIAL HISTORY: He is married. 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   HABITS: No smoking. No alcohol use.   FAMILY HISTORY: Father with myocardial infarction at age 21. Mother with breast cancer.   REVIEW OF SYSTEMS: CONSTITUTIONAL: No fever, fatigue, or weakness. EYES: No blurred or double vision. ENT: No tinnitus, ear pain, hearing loss. RESPIRATORY: No cough, wheeze, hemoptysis. CARDIOVASCULAR: No chest pain, orthopnea or edema. GASTROINTESTINAL: No nausea, vomiting, diarrhea, or abdominal pain. GU: No dysuria or hematuria. ENDOCRINE: No polyuria or nocturia. HEMATOLOGY: No anemia or easy bruising. SKIN: Positive presence of diabetic foot ulcer on the left great toe. NEUROLOGICAL:   Decreased sensation in both the lower extremities appears secondary to diabetes. PSYCHIATRIC: No anxiety or depression. All other systems are reviewed and negative.   PHYSICAL EXAMINATION:  GENERAL: The patient is awake, alert, and oriented x3, not in acute distress.   VITAL SIGNS: He is afebrile, pulse is 85, blood pressure is 144/62. Saturations are 100% on room air.   HEENT: Atraumatic, normocephalic. Pupils are equal, round, and reactive to light and accommodation. Extraocular movements are intact. Oral mucosa is moist.   NECK: Supple. No JVD. No carotid bruit.   LUNGS: Clear to auscultation bilaterally. No rales, rhonchi, respiratory distress, or labored breathing.  HEART: Both the heart sounds are normal. Rate, rhythm is regular. PMI is  not lateralized.   CHEST: Nontender.   EXTREMITIES: There is pitting edema in both the lower extremities, more on the left than the right up to the calf, along with cellulitis in the left leg with swollen left great toe with diabetic foot ulcer over on the plantar aspect of the left great toe. There is some minimal discharge  present. Pedal pulses are not palpable secondary to edema. Good femoral pulses.   NEUROLOGICAL: Grossly intact cranial nerves II through XII. No motor deficit. The patient does have sensory peripheral neuropathy.    LABORATORY DATA: Glucose is 327. The rest of the chemistry is within normal limits. White count is 7.1, hemoglobin and hematocrit is 10.8 and 31.6, platelet count is 198.   ASSESSMENT: [name] is a 64 year old with: 1. Left great toe/foot diabetic foot ulcer.  2. Left lower extremity swelling with cellulitis. Left lower extremity Doppler negative for deep venous thrombosis, positive for bakers cyst.   3. Uncontrolled type 2 diabetes. The patient currently is only on Actos. His hemoglobin A1c averages around 11.2.  4. Coronary artery disease with stent in the past and angioplasty in 2010.  5. Hypertension.  6. Cardiomyopathy, appears ischemic with ejection fraction of 40% by echocardiogram July 2012.  7. Hyperlipidemia.  8. Suspected sleep apnea.   PLAN:  1. Admit the patient to a Surgical floor.  2. A 2 grams sodium, ADA 1800 calorie diet.  3. I will start the patient on sliding scale insulin. I will also start the patient on Lantus insulin, given his foot diabetic foot ulcer. He will need stricter blood sugar control. I will check A1c.  4. I will have Dr. Gabriel Carina see the patient in consultation.  The patient also will need outpatient followup as well with Dr. Gabriel Carina.  5. Continue IV vancomycin and IV Zosyn.  6. Follow blood cultures and wound cultures.  7. Podiatry consultation in the morning.  8. Continue the rest of home medications  9. Further work-up according to the patient's clinical course.   The hospital admission plan was discussed with the patient. No family member is present.   TIME SPENT: 50 minutes.   ____________________________ Hart Rochester Posey Pronto, MD sap:cbb D: 03/31/2011 19:44:08 ET T: 03/31/2011 21:03:05 ET JOB#: [id number]  cc: Amandeep Nesmith A. Posey Pronto, MD,  <Dictator> Youlanda Roys. Lovie Macadamia, MD Ilda Basset MD ELECTRONICALLY SIGNED 04/10/2011 7:30

## 2014-07-08 NOTE — Op Note (Signed)
PATIENT NAME:  [name], CURRIER MR#:  [id number] DATE OF BIRTH:  25-Jan-1951  DATE OF PROCEDURE:  04/06/2011  PREOPERATIVE DIAGNOSES:  1. Left diabetic foot infection.  2. Patient requires IV antibiotics for greater than five days.   POSTOPERATIVE DIAGNOSES: 1. Left diabetic foot infection.  2. Patient requires IV antibiotics for greater than five days.   PROCEDURE PERFORMED: Insertion of left basilic PICC line Morpheus single lumen.   PROCEDURE PERFORMED BY: Katha Cabal, MD   PROCEDURE: The patient is brought to Special Procedures and placed in the supine position with the left arm extended palm upward. Left arm is prepped and draped in a sterile fashion. Ultrasound is placed in a sterile sleeve. Ultrasound is utilized secondary to lack of appropriate landmarks to avoid vascular injury. Under direct ultrasound visualization, the basilic vein is identified. It is noted to be echolucent, homogeneous, and easily compressible indicating patency. Image is recorded for the permanent record. Under direct visualization, microneedle is inserted into the basilic vein. Microwire is then advanced. Under fluoroscopy, the wire is positioned with its tip at the atriocaval junction. Measurements are made. Dilator and peel-away sheath are then inserted. Dilator is removed. Morpheus single lumen PICC line is then trimmed to 45 cm in length and advanced over the wire through the peel-away sheath. Wire and peel-away sheath are removed. Under fluoroscopy, the tip of the PICC line is located in the proximal superior vena cava. PICC line aspirates and flushes easily and is packed with heparinized saline. It is secured to the arm with a StatLock and a sterile dressing is applied. The patient tolerated the procedure well and there were no immediate complications.   ____________________________ Katha Cabal, MD ggs:drc D: 04/08/2011 10:42:00 ET T: 04/08/2011 10:53:55 ET JOB#: [id number]  cc: Katha Cabal,  MD, <Dictator> Katha Cabal MD ELECTRONICALLY SIGNED 04/29/2011 11:24

## 2014-07-08 NOTE — Op Note (Signed)
PATIENT NAME:  [name], FAUTH MR#:  [id number] DATE OF BIRTH:  1950-12-07  DATE OF PROCEDURE:  04/09/2011  PREOPERATIVE DIAGNOSIS: Acute renal failure, rule out hydronephrosis.   POSTOPERATIVE DIAGNOSIS: No evidence of hydronephrosis.   PROCEDURE: Cystoscopy with bilateral retrograde pyelograms.   SURGEON: Jakevion Arney C. Bernardo Heater, MD   ASSISTANT: None.   ANESTHETIC: General.   FINDINGS:  1. Urethra was normal in caliber without stricture.  2. Prostate is nonocclusive.  3. The bladder mucosa was normal in appearance without erythema, solid or papillary lesions. Ureteral orifices were normal appearing bilaterally.  4. Left retrograde pyelogram: The ureter was normal in caliber without dilation, stone or filling defect. The left collecting system was normal in appearance with delicately cupped calyces and no evidence of hydronephrosis.  5. Right retrograde pyelogram: The right ureter was normal in caliber. The right collecting system and renal pelvis was normal in appearance without evidence of hydronephrosis. No filling defects or abnormalities identified.   INDICATIONS: The patient is a 64 year old male admitted 03/31/2011 with cellulitis and subsequent diagnosis of osteomyelitis of the left great toe. He was initially started on vancomycin. His admission creatinine was 0.79. A follow-up creatinine on 01/21 was 6.41. His creatinine has steadily risen and today was 8.42. A renal ultrasound suggested left hydronephrosis. A noncontrast CT scan of the abdomen and pelvis was interpreted as bilateral parapelvic cyst versus hydronephrosis. Based on his lack of symptoms, it was felt it was unlikely for acute hydronephrosis to cause his renal failure, however, it was felt the most likely etiology was multifactorial. Retrograde pyelograms have been requested to rule out the possibility of hydronephrosis.   DESCRIPTION OF PROCEDURE: The patient was taken to the operating room where a general anesthetic was  administered. He was placed in the low lithotomy position and his external genitalia were prepped and draped sterilely. Time-out was performed per protocol. A 22 French Olympus cystoscope with 30 degree lens was lubricated and passed under direct vision. Cystoscopic findings are as described above. A 5 French open-ended ureteral catheter was placed through the cystoscope. The left ureteral orifice was cannulated and left retrograde pyelogram was performed with findings as described above. An identical procedure was performed on the contralateral side with findings as described above. After retrograde pyelogram on real-time fluoroscopy there was good emptying of the ureter and collecting systems noted. The bladder was emptied and the cystoscope was removed. The patient was taken to the PAC-U in stable condition. There were no complications. EBL was zero.    ____________________________ Ronda Fairly Bernardo Heater, MD scs:drc D: 04/09/2011 12:43:12 ET T: 04/09/2011 13:00:28 ET JOB#: [id number]  cc: Nicki Reaper C. Bernardo Heater, MD, <Dictator> Abbie Sons MD ELECTRONICALLY SIGNED 04/29/2011 8:43

## 2014-07-08 NOTE — Consult Note (Signed)
Details:    - Creatinine higher today at 8.42 Cysto retrograde pyelograms to eval for hydronephrosis All questions answered and he desires to proceed   Electronic Signatures: Abbie Sons (MD)  (Signed 24-Jan-13 11:58)  Authored: Details   Last Updated: 24-Jan-13 11:58 by Abbie Sons (MD)

## 2014-07-08 NOTE — Op Note (Signed)
PATIENT NAME:  [name], [name] MR#:  [id number] DATE OF BIRTH:  [birth year]  DATE OF PROCEDURE:  04/09/2011  PREOPERATIVE DIAGNOSES:  1. Acute renal insufficiency.  2. Diabetic nephropathy.  3. Diabetic foot infection.   POSTOPERATIVE DIAGNOSES:  1. Acute renal insufficiency.  2. Diabetic nephropathy.  3. Diabetic foot infection.   PROCEDURE PERFORMED: Right femoral temporary dialysis catheter insertion with ultrasound guidance.   SURGEON: Katha Cabal, MD  DESCRIPTION OF PROCEDURE: Patient is in his hospital room. He is positioned supine and the right groin is shaved and then prepped and draped in sterile fashion. Ultrasound is placed in a sterile sleeve. Ultrasound is utilized secondary to lack of appropriate landmarks and to avoid vascular injury. Under direct ultrasound visualization, Seldinger needle is inserted into the common femoral vein. Vein is noted to be echolucent, homogeneous, easily compressible indicating patency. Image is recorded. J-wire is then advanced without difficulty. Counterincision is created. Dilator is passed over the wire and the double-lumen 30 cm long temporary dialysis catheter is advanced without difficulty. Both lumens aspirate easily and flush well. Catheter is secured to the skin of the thigh with 2-0 nylon. Sterile dressing is applied. Patient tolerated procedure well. No immediate complications.   ____________________________ Katha Cabal, MD ggs:cms D: 04/09/2011 19:16:52 ET T: 04/10/2011 07:31:49 ET JOB#: [id number]  cc: Katha Cabal, MD, <Dictator> Katha Cabal MD ELECTRONICALLY SIGNED 04/29/2011 11:24

## 2014-07-08 NOTE — Discharge Summary (Signed)
PATIENT NAME:  [name], [name] MR#:  [id number] DATE OF BIRTH:  December 22, 1950  DATE OF ADMISSION:  03/31/2011 DATE OF DISCHARGE:  04/15/2011  Please see interim discharge summary dictated by Dr. Tressia Miners on 04/07/2011 for course from admission until 04/07/2011 final. This is the final discharge summary for this patient.   DISCHARGE DIAGNOSES:  1. Osteomyelitis of the left first toe on tigecycline per infectious disease. Will need a total of six weeks from operation.  2. Acute renal failure, possibly acute interstitial nephritis due to vancomycin, improving. Received hemodialysis while in the hospital, now stopped, making enough urine.  3. Diabetes, poorly controlled with hemoglobin A1c of 11.2 on Actos, now on Lantus and Aspart. Left greater than right renal cysts versus hydronephrosis with nonobstructing left-sided nephrolithiasis.  4. Cardiomyopathy with ejection fraction of 40%, cannot use Lasix/ACE inhibitor/ARB due to acute renal failure, well compensated at this time.   SECONDARY DIAGNOSES:  1. Type 2 diabetes. 2. Morbid obesity.  3. Hypertension.  4. Coronary artery disease, status post stenting.  5. Hypotestosteronism. 6. Hyperlipidemia.   CONSULTATIONS: As dictated by Dr. Tressia Miners in the interim discharge summary.   PROCEDURES/RADIOLOGY: As dictated by Dr. Tressia Miners in the interim discharge summary on 04/07/2011. In addition:  1. Cystoscopy with bilateral retrograde pyelogram by Dr. Bernardo Heater on 04/09/2011. 2. Right femoral temporary dialysis catheter insertion with ultrasound guidance by Dr. Delana Meyer on 04/09/2011.  MAJOR LABORATORY PANEL: Urine eosinophil was negative on 01/22. Serum ANA was negative. ANCA panel was negative. Antiglomerular basement antibody was negative. Serum complement C3 was within normal limits. Serum complement C4 was within normal limits. Hepatitis A, B, and C panels were negative. Serum protein electrophoresis showed low serum total protein, low albumin, low  gammaglobulin. Urine protein electrophoresis showed high total protein, otherwise within normal limits. HBsAg on 04/09/2011 was negative. C-reactive protein was elevated with a value of 23.4.   HISTORY AND SHORT HOSPITAL COURSE: Please see Dr. Wyatt Portela dictated interim discharge summary 04/07/2011 for course from admission until the 22nd of 03/2011. Also see Dr. Gus Height Patel's dictated History and Physical for further details.  In brief, the patient was admitted for left hallux osteomyelitis that required distal amputation and was on vancomycin. He began developing acute renal failure which was thought to be interstitial nephritis due to vancomycin. The antibiotic was stopped and switched from vancomycin and Zosyn to linezolid. The patient's ultrasound of the kidneys was showing mild to moderate hydronephrosis for which urology consult was obtained with Dr. Bernardo Heater who recommended cystoscopy with retrograde pyelogram which was performed on the 24th of 03/2011, which was within normal limits.  It did not show any obvious hydronephrosis. The patient was also followed by Dr. Clayborn Bigness who recommended changing his antibiotic to tigecycline on the 22nd of 03/2011 based on culture sensitivity and growth of the organism from the wound culture. The patient was also followed by endocrinologist Dr. Gabriel Carina for uncontrolled diabetes and the patient was started on insulin Lantus for better blood sugar control along with NovoLog insulin. The patient's kidney functions were not significantly improving and vancomycin level remained quite high. His urine output was also quite marginal, and after discussion with the consultant the decision was made to start him on dialysis to bring down his vancomycin level. Vascular surgery was consulted to place a temporary dialysis catheter, which was done on the 24th of 03/2011 by Dr. Delana Meyer.  Subsequently the patient was started on dialysis and required hemodialysis as per nephrology. The  patient was slowly improving with  dialysis. His creatinine was also coming down and his urine output was gradually improving. He was started on Lasix on 01/28 and was recommended to watch off dialysis by Dr. Candiss Norse.  His creatinine was slowly creeping up although he was producing a good amount of urine off dialysis for 48 hours. His creatinine was 5 on 01/30 but he still had a significant amount of urine output, about 3300 mL in the last 24 hours before discharge. After discussion with nephrology, infectious disease, podiatry, and all the consultants, the patient was felt to be stable enough to be discharged home on 04/15/2011 with outpatient followup.   On the date of discharge, his vital signs were as follows: Temperature 97.5, heart rate 80 per minute, respirations 18 per minute, blood pressure 131/74 mmHg. He was saturating 96% on room air.   PERTINENT PHYSICAL EXAMINATION ON THE DATE OF DISCHARGE: CARDIOVASCULAR: S1, S2 normal. No murmur, rubs, or gallop. LUNGS: Clear to auscultation bilaterally. No wheezing, rales, rhonchi, or crepitation. ABDOMEN: Soft, benign.  NEUROLOGIC: Nonfocal examination. EXTREMITIES: His left great toe looked very good, did not have any redness or drainage from the incision margin per Dr. Elvina Mattes. All other physical examination remained at baseline.   DISCHARGE MEDICATIONS:  1. Aspirin 325 mg p.o. daily.  2. Actos 30 mg p.o. daily.  3. Lipitor 40 mg p.o. daily.  4. Tigecycline 50 mg IV b.i.d. to be stopped on 05/15/2011. 5. Aspart insulin 5 units subcutaneous t.i.d. prior to meals.  6. Insulin Lantus 5 units subcutaneous at bedtime.  7. Percocet 7.5/325, 1 tablet p.o. every six hours as needed, 20 tablets prescribed. No refill.   DISCHARGE DIET: Low sodium, low cholesterol, 1800 ADA.   DISCHARGE ACTIVITY: As tolerated.   DISCHARGE INSTRUCTIONS AND FOLLOWUP:  1. The patient was instructed not to take Cozaar until seen by nephrology due to his acute renal failure.   2. He was instructed to keep his dressing dry, clean, and intact until seen by Dr. Elvina Mattes at the follow-up appointment on Tuesday, 02/05.  3. He will need followup with Dr. Juluis Pitch, his primary care physician, in 1 to 2 weeks. 4. He will follow up with Dr. Gabriel Carina on the 31st of 03/2011 as scheduled. 5. He will follow up with Dr. [name] in 3 to 4 weeks.  6. He will follow up with Dr. Candiss Norse in one week. 7. He will follow up with Dr. Legrand Como Blocker in six weeks. 8. He will need CBC and basic metabolic panel checked on Friday, 02/01, with results forwarded to his primary care physician and Dr. Candiss Norse for evaluation of his kidney function and infection (white count). 9. He will get Home Health and physical therapy along with nursing at home, which is set up by care management.      TOTAL TIME DISCHARGING THIS PATIENT: 55 minutes.     ____________________________ Lucina Mellow. Manuella Ghazi, MD vss:bjt D: 04/18/2011 14:09:19 ET T: 04/20/2011 11:47:20 ET JOB#: [id number]  cc: Corbin Falck S. Manuella Ghazi, MD, <Dictator> Youlanda Roys. Lovie Macadamia, MD A. Lavone Orn, MD Corey Skains, MD Murlean Iba, MD Corazon Blocker, MD Gerrit Heck. Troxler, DPM Remer Macho MD ELECTRONICALLY SIGNED 04/20/2011 12:48

## 2014-07-08 NOTE — Consult Note (Signed)
PATIENT NAME:  [name], [name] MR#:  [id number] DATE OF BIRTH:  1951-01-12  DATE OF CONSULTATION:  04/07/2011  REFERRING PHYSICIAN:  Dr. Tressia Miners  CONSULTING PHYSICIAN:  Tujuana Kilmartin Lilian Kapur, MD  REASON FOR CONSULTATION: Severe acute renal failure.   HISTORY OF PRESENT ILLNESS: The patient is a 64 year old Caucasian male with past medical history of diabetes mellitus, morbid obesity, hypertension, coronary artery disease status post stenting in 2001, cardiomyopathy with ejection fraction of 40%, hypotestosteronism and hyperlipidemia who presented to [facility] on 03/31/2011 with left great toe swelling and development of ulcer. He was ultimately found to have osteomyelitis of the left first toe and subsequently had amputation. He was started on broad-spectrum antibiotics including Zosyn and vancomycin. Patient reports to me that after initiation of antibiotic therapy he had a period of time where he had nausea, vomiting, and poor p.o. intake for three days. His creatinine is now up to 7.42. His presenting creatinine was found to be 0.79. Patient had a renal ultrasound performed which demonstrated left-sided hydronephrosis. CT scan of the abdomen and pelvis is currently pending. Urinalysis was also performed on 01/21 which showed urine protein 30 mg/dL, 2 RBCs per high-power field and 3 WBCs per high-power field.   PAST MEDICAL HISTORY:  1. Diabetes mellitus type 2.  2. Hypertension.  3. Morbid obesity.  4. Coronary artery disease, status post stenting in 2001.  5. Cardiomyopathy with ejection fraction of 40%.  6. Hypotestosteronism.  7. Hyperlipidemia.   PAST SURGICAL HISTORY:  1. Right meniscus repair.  2. Nasopharyngeal surgery for sleep apnea.  3. Right knee replacement 2008.  4. Cholecystectomy in 1985.  5. Cardiac catheterization with angioplasty in 04/2008.   ALLERGIES: Toprol.   CURRENT INPATIENT MEDICATIONS:  1. Normal saline 75 mL/h. 2. Percocet 7.5/325, 1 to  2 tablets p.o. every four hours p.r.n.  3. Aspirin 81 mg daily.  4. Heparin 5000 units subcutaneous every eight hours. 5. NovoLog insulin 10 units subcutaneous t.i.d.  6. Sliding scale insulin. 7. Actos 30 mg daily. 8. Zofran 4 mg IV every four hours p.r.n.  9. Phenergan 25 mg intramuscular every four hours p.r.n. nausea.  10. Lantus insulin 25 units subcutaneous at bedtime.  11. Linezolid 600 mg p.o. every 12 hours.   12. Losartan 50 mg p.o. daily which is currently on hold.   SOCIAL HISTORY: Patient is married. He denies tobacco abuse. He drinks alcohol, 2 to 3 beers per week. Denies illicit drug use. 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   FAMILY HISTORY: Father with history of coronary artery disease and mother had history of breast cancer.   REVIEW OF SYSTEMS: CONSTITUTIONAL: Currently denies fevers, chills, or malaise. EYES: Denies diplopia, blurry vision. HENT: Denies headaches, hearing loss. Denies epistaxis or sore throat. PULMONARY: Denies cough, shortness of breath, hemoptysis. CARDIOVASCULAR: Denies chest pain, palpitations, PND, orthopnea. Does have history of underlying coronary artery disease. GASTROINTESTINAL: Reports nausea, vomiting over the past three days. Currently denies abdominal pain. GENITOURINARY: Denies frequency, urgency, dysuria. NEUROLOGIC: Denies focal extremity numbness, weakness or tingling. MUSCULOSKELETAL: Denies joint pain, swelling at present. Had recent amputation of left first great toe. INTEGUMENTARY: Denies skin rashes or lesions recently. Did have redness of his left lower extremity but this appears to have resolved. ENDOCRINE: Denies polyuria, polydipsia, polyphagia. HEMATOLOGIC/LYMPHATIC: Denies easy bruisability, bleeding, or swollen lymph nodes. PSYCHIATRIC: Denies depression, bipolar disorder. ALLERGY/IMMUNOLOGIC: Denies seasonal allergies or  history of immunodeficiency.   PHYSICAL EXAMINATION:  VITAL SIGNS: Temperature 98.4, pulse 84, respirations 18, blood pressure 138/74, pulse oximetry 96% on room air.   GENERAL: Obese Caucasian male who appears his stated age, currently in no acute distress.   HEENT: Normocephalic, atraumatic. Extraocular movements are intact. Pupils equal, round, reactive to light. No scleral icterus. Conjunctivae are pink. No epistaxis noted. Gross hearing intact. Oral mucosa are moist.   NECK: Supple without JVD or lymphadenopathy.   LUNGS: Clear to auscultation bilaterally with normal respiratory effort.   CARDIOVASCULAR: S1, S2 regular rate and rhythm. No murmurs, rubs, or gallops appreciated.   ABDOMEN: Soft, nontender, nondistended. Bowel sounds positive. No rebound or guarding. No gross organomegaly appreciated.   EXTREMITIES: No clubbing or cyanosis noted. There is dressing on the left lower extremity noted.   NEUROLOGIC: Patient alert and oriented to time, person, and place. Strength is 5/5 in both upper and lower extremities.   SKIN: Warm and dry. No obvious rashes noted.   MUSCULOSKELETAL: No joint redness, swelling or tenderness appreciated.   PSYCHIATRIC: Patient with appropriate affect and appears to have good insight into his current illness.   LABORATORY, DIAGNOSTIC AND RADIOLOGICAL DATA: Sodium 139, potassium 4.2, chloride 104, CO2 23, BUN 42, creatinine 7.4, glucose 137, EGFR 8, calcium 7.7, total protein 6.4, albumin 3.4, total bilirubin 0.9, alkaline phosphatase 84, AST 34, ALT 41. Vancomycin trough on 01/17 was 17. CBC shows WBC 7.1, hemoglobin 10.6, hematocrit 31, platelets 184. Urinalysis shows urine protein 30 mg/dL, 2 RBCs per high-power field, 3 WBCs per high-power field. CRP is quite high at 18.8.   IMPRESSION: This is a 64 year old Caucasian male with past medical history of diabetes mellitus, hypertension, morbid obesity, coronary artery disease status post stenting,  cardiomyopathy with ejection fraction of 40%, hypotestosteronism, and hyperlipidemia who presented to [facility] with left great toe swelling and development of ulcer and is status post amputation of left first toe.   PROBLEM LIST:  1. Severe acute renal failure.  2. Left great toe osteomyelitis status post distal phalanx amputation.  3. Hypertension.   PLAN: The patient presents with severe case of acute renal failure. Differential diagnoses is extensive at this point in time. The patient has had nausea and vomiting for three days with poor p.o. intake including poor fluid intake. This raises the specter of acute tubular necrosis. Nausea and vomiting may have been related to antibiotic administration. In addition, he has been on both vancomycin as well as Zosyn both of which could cause renal insufficiency in a number of ways. Vancomycin toxicity could occur if trough levels are found to be high. Most recent trough was found to be 17, however, this was on 04/02/2011. No other trough since then. In addition, the patient was on Zosyn which could potentially cause allergic interstitial nephritis, however, timeline for acute renal failure appears rather premature for this possibility. In addition, the patient was found to have left-sided hydronephrosis which could partially explain his renal insufficiency. We will proceed with a CT scan of the abdomen and pelvis to further evaluate. Vancomycin and Zosyn have been stopped. I agree with gentle IV fluid hydration for now. We will obtain further serologic work-up including SPEP, UPEP, ANA, ANCA antibodies, GBM antibodies, and hepatitis serologies. No acute indication to proceed with renal biopsy at present, however, if renal function continues to worsen this could be a possibility. In addition, no indication for renal replacement therapy at present, however, if renal function is to  worsen we may need to consider this possibility as well.   I  would like to thank Dr. Tressia Miners for this kind referral. Further plan as patient progresses.  ____________________________ Tama High, MD mnl:cms D: 04/07/2011 12:39:01 ET T: 04/07/2011 13:20:17 ET JOB#: [id number]  cc: Tama High, MD, <Dictator> Tama High MD ELECTRONICALLY SIGNED 04/24/2011 20:47

## 2014-07-08 NOTE — Consult Note (Signed)
Brief Consult Note: Diagnosis: Acute renal failure.   Patient was seen by consultant.   Consult note dictated.   Comments: CT findings unlikely represent hydronephrosis. Bilat hydronephrosis would have to be acute to account for significant creatinine rise in the past 4 days.  Based on current creatinine level cysto w/ retrograde pyelograms would be the only way to definitively evaluate.  Electronic Signatures: Abbie Sons (MD)  (Signed 22-Jan-13 21:35)  Authored: Brief Consult Note   Last Updated: 22-Jan-13 21:35 by Abbie Sons (MD)

## 2014-07-08 NOTE — Consult Note (Signed)
PATIENT NAME:  [name], [name] MR#:  [id number] DATE OF BIRTH:  1950/07/29  DATE OF CONSULTATION:  04/07/2011  REFERRING PHYSICIAN:   CONSULTING PHYSICIAN:  Scott C. Bernardo Heater, MD  COMPLETING A CONSULT ON [place].  THE ENDING SENTENCE SHOULD BE: Hydronephrosis could not be excluded. He has been voiding without problems. His urine output has not been recorded.    PAST MEDICAL HISTORY:  1. Type 2 diabetes.  2. Obesity.  3. Hypertension.  4. Coronary artery disease.  5. Cardiomyopathy with ejection fraction of 40%.  6. Male hypogonadism.  7. Hyperlipidemia.   PAST SURGICAL HISTORY:  1. Cholecystectomy in 1985. 2. Right meniscus repair.  3. Nasopharyngeal surgery for sleep apnea.  4. Right knee replacement 2008.   ALLERGIES: Toprol-XL.   MEDICATIONS ON ADMISSION:  1. ASA 325 mg daily.  2. Actos 30 mg daily.  3. Losartan 50 mg daily.  4. Lipitor 40 mg daily.   SOCIAL HISTORY: No tobacco or alcohol use.   REVIEW OF SYSTEMS: Otherwise noncontributory except as per the history of present illness.    PHYSICAL EXAMINATION:  VITAL SIGNS: Temperature 98.3, blood pressure 134/62, pulse 80.   GENERAL: Cooperative male in no acute distress.   ABDOMEN: Soft, nontender.   LABORATORY, DIAGNOSTIC AND RADIOLOGICAL DATA: Creatinine this morning 7.42, potassium 4.2. Urinalysis on 01/21 shows 30 mg protein, 2 RBCs per high-power field, 3 WBCs per high-power field. Renal ultrasound performed today was suboptimal. CT was reviewed. There is fullness in the vicinity of the renal pelves bilaterally, left greater than right. This was a noncontrast study. There is a left renal calculus not in an obstructing position. The bladder is not distended. There is no hydroureter.   IMPRESSION: Acute renal failure-Doubt the CT findings represent hydronephrosis as it would be highly unlikely for his creatinine to jump from 0.79 to 7.42 with acute bilateral obstruction that is asymptomatic. There were no  baseline images for comparison. It was noted on renal ultrasound he did have bilateral lower ureteral jets.   RECOMMENDATION: Based on his current creatinine level the only definite way to ascertain if he truly has hydronephrosis would be cystoscopy with bilateral retrograde pyelograms. This would require anesthesia. Please contact me if this is needed for further evaluation.  ____________________________ Ronda Fairly Bernardo Heater, MD scs:cms D: 04/07/2011 21:31:00 ET T: 04/08/2011 07:48:35 ET JOB#: [id number]  cc: Nicki Reaper C. Bernardo Heater, MD, <Dictator> Abbie Sons MD ELECTRONICALLY SIGNED 04/29/2011 8:43

## 2014-07-08 NOTE — Consult Note (Signed)
Impression: 64yo WM w/ h/o DM admitted with left great toe osteomyelitis, s/p amputation.  He is s/p amputation.  Will likely need 6 weeks of IV therapy.   Continue vanco and zosyn until cultures return.  Will need to adjust antibiotics based on results. Would get PICC line placed. Will get ESR and CRP.  Will follow these and CBC while on therapy. I will see him weekly as an outpatient while on antibiotics.  Electronic Signatures: Blessed Girdner, Heinz Knuckles (MD)  (Signed on 18-Jan-13 14:41)  Authored  Last Updated: 18-Jan-13 14:41 by Grier Vu, Heinz Knuckles (MD)

## 2014-07-08 NOTE — Consult Note (Signed)
PATIENT NAME:  [name], [name] MR#:  [id number] DATE OF BIRTH:  10/20/1950  DATE OF CONSULTATION:  04/07/2011  REFERRING PHYSICIAN:  Dr. Tressia Miners  CONSULTING PHYSICIAN:  Cline Draheim C. Hobie Kohles, MD  REASON FOR CONSULTATION: Acute renal failure with cyst versus hydronephrosis on CT abdomen.   HISTORY OF PRESENT ILLNESS: 64 year old white male admitted 03/31/2011 with cellulitis of the left lower extremity and osteomyelitis left great toe. He underwent amputation of the left hallux distal phalanx on 04/03/2011. On hospital admission he was started on Zosyn and vancomycin. After initiation of antibiotics he did have a period of nausea, vomiting and poor p.o. intake x3 days. His admission creatinine was 0.79 and on 01/17 was 0.69. It was rechecked on 01/21 and it had increased to 6.41. On 01/22 it had increased to 7.42. He denies flank or abdominal pain. He denies prior history of urologic problems or voiding difficulty. A renal ultrasound performed today suggested mild to moderate left hydronephrosis. A noncontrast CT scan of the abdomen and pelvis performed which showed bilateral fluid density in the region of the renal pelves of which parapelvic cyst versus hydronephrosis could not be excluded.  (dictation cut off here)  ____________________________ Ronda Fairly. Bernardo Heater, MD scs:cms D: 04/07/2011 21:20:28 ET T: 04/08/2011 07:36:11 ET JOB#: [id number]  cc: Nicki Reaper C. Bernardo Heater, MD, <Dictator> Abbie Sons MD ELECTRONICALLY SIGNED 04/29/2011 8:42

## 2014-07-08 NOTE — Consult Note (Signed)
PATIENT NAME:  [name], [name] MR#:  [id number] DATE OF BIRTH:  Dec 31, 1950  DATE OF CONSULTATION:  04/01/2011  REFERRING PHYSICIAN:  Fritzi Mandes, MD  CONSULTING PHYSICIAN:  A. Lavone Orn, MD   PRIMARY CARE PHYSICIAN: Juluis Pitch, MD   CHIEF COMPLAINT: Uncontrolled diabetes.   HISTORY OF PRESENT ILLNESS: This is a 64 year old male with a long history of diabetes complicated by peripheral neuropathy who presented to Acute Care yesterday with a left great toe diabetic foot ulcer. He had noticed a blister initially about two weeks ago and had noticed some swelling of the left foot for two days. He had minimal pain. No fever/chills. His presenting blood sugar was 327 and his hemoglobin A1c is 11.2%. The patient has had diabetes since the mid 1980's. His prior A1c on 12/26/2010 was 11.1%. However, in April 2012 it was 7.3%. His current regimen is just Actos 30 mg daily. He's had no recent medication changes. In the past he took various other oral antidiabetic medications including glyburide and metformin and is not clear when or why those were stopped. Since admission, blood sugars have remained high and in the range of 246 to 316. He is currently on Lantus 25 units daily, last dose given at 9:30 a.m., and NovoLog sliding scale. He has no acute complaints. He denies any polyuria, polydipsia, or blurred vision. His weight has been stable. He has no discomfort in the foot.   PAST MEDICAL HISTORY:  1. Diabetes mellitus.  2. Obesity.  3. Hypertension.  4. Coronary artery disease status post PCI with stent placement in 2001.  5. Hyperlipidemia.   PAST SURGICAL HISTORY:  1. Cholecystectomy 1985.  2. Right total knee replacement 2008.  3. Nasopharyngeal surgery for sleep apnea.  4. Meniscus repair of right knee.   ALLERGIES: Toprol.   SOCIAL HISTORY: Married. No tobacco use. Drinks about two beers per week. Employed.   FAMILY HISTORY: No known diabetes. Father died of MI in his 10's. Mother had  breast cancer.   REVIEW OF SYSTEMS: No fevers. No weight loss. HEENT: No blurred vision. No sore throat. NECK: No neck pain. No dysphagia. CARDIAC: No chest pain. No palpitations. PULMONARY: No cough. No shortness of breath. ABDOMEN: No abdominal pain. Good appetite. No recent change in bowel habits. EXTREMITIES: Some leg swelling. Left foot great toe ulcer as per history of present illness. NEUROLOGIC: Decreased sensation in the foot. No foot pain. No recent weakness. No recent falls. HEME: No easy bruisability or recent bleeding. ENDOCRINE: No heat or cold intolerance.   PHYSICAL EXAMINATION:   VITAL SIGNS: Temperature 97.9, pulse 80, respiratory rate 20, blood pressure 113/61.   GENERAL: Well developed, well nourished white male in no distress.   HEENT: Extraocular movements are intact. Oropharynx is clear. Mucous membranes moist.   NECK: Supple. No thyromegaly.   PULMONARY: Clear to auscultation bilaterally. Good inspiratory effort. No wheeze.   CARDIAC: Regular rate and rhythm. No carotid bruit.   ABDOMEN: Diffusely soft, nontender, nondistended. Positive bowel sounds.   EXTREMITIES: There is trace right lower extremity edema. There is edema on the left leg.   SKIN: There are erythemic skin changes up to the mid right shin. Dressing in place over great toe ulcer was not removed. It is clean and dry. No acanthosis nigricans.   NEUROLOGIC: No tremors present. Cognitively intact.   PSYCHIATRIC: Alert and oriented x3.   LABORATORY, DIAGNOSTIC, AND RADIOLOGICAL DATA: Laboratory from 03/31/2011 glucose 327, BUN 14, creatinine 0.79, sodium 140, potassium 4.1, eGFR  greater than 60, calcium 8.8. Hemoglobin A1c 11.2%. AST 34, ALT 41, hematocrit 31.6%.   ASSESSMENT: This is a 64 year old male with longstanding diabetes complicated by neuropathy admitted for infected ulcer of the left great toe with possible osteomyelitis. 1. Uncontrolled diabetes. Agree with use of insulin while inpatient.  Will adjust Lantus to at bedtime dosing. Plan to give 15 units tonight and then 35 units at bedtime thereafter. Will additionally add prandial insulin with NovoLog 10 units q.a.c. Blood sugars may improve as infection resolves. For future outpatient planning, anticipate perhaps restarting a metformin/glyburide combination as he took that in the past and may have had better control on that. I believe he was taking it last spring when A1c was in the 7 range, however, it seems to have been stopped sometime in the last few months or sooner.  2. Diabetic foot ulcer. This will be followed by Podiatry. He is to undergo a bone scan and he may need amputation.   Thank you for the kind request for consultation. I will follow along with you.  ____________________________ A. Lavone Orn, MD ams:drc D: 04/01/2011 17:29:49 ET T: 04/02/2011 09:37:55 ET JOB#: [id number]  cc: A. Lavone Orn, MD, <Dictator> Sherlon Handing MD ELECTRONICALLY SIGNED 04/02/2011 14:22

## 2014-07-08 NOTE — Consult Note (Signed)
PATIENT NAME:  [name], [name] MR#:  [id number] DATE OF BIRTH:  1950-08-19  DATE OF CONSULTATION:  04/03/2011  REFERRING PHYSICIAN:  Dr. Posey Pronto  CONSULTING PHYSICIAN:  Heinz Knuckles. Sandia Pfund, MD  REASON FOR CONSULTATION: Left great toe osteomyelitis.   HISTORY OF PRESENT ILLNESS: The patient is a 64 year old white man with a past history significant for diabetes who was admitted with pain, swelling, and redness of the left foot on 03/31/2011. He had initially noticed a blister on the foot several weeks ago. He took care of this at home and eventually improved and resolved. He subsequently began developing redness and swelling of the great toe on the left about a week ago. Over the last several days prior to admission, he began having increased redness going up his leg. He had increased swelling up his leg as well as pain. He did not have any fevers, chills, or sweats. He states that he bought a pair of shoes approximately a month ago and found a piece of plastic sticking into the medial aspect of it that he suspects was the impetus for the initial blistering. He presented to Urgent Care and was referred to the ER and subsequently admitted. Cultures have been obtained and they are currently pending. He has been treated with vancomycin and Zosyn. His white count has been normal during his hospitalization. He had a bone scan that was positive for osteomyelitis and today underwent amputation of the left great toe.   ALLERGIES: Baycol, morphine, and Toprol.   PAST MEDICAL HISTORY:  1. Diabetes.  2. Hypertension.  3. Hypercholesterolemia.  4. Coronary artery disease status post angioplasty.  5. Cardiomyopathy with an EF of 40%.  6. Hypogonadism. 7. Disk disease.   SOCIAL HISTORY: The patient lives with his wife. He works in a factory. He is a nonsmoker. He does not drink.    FAMILY HISTORY: Positive for breast cancer and coronary artery disease.   REVIEW OF SYSTEMS: GENERAL: No fevers, chills, sweats,  or malaise. HEENT: No headache. No sinus congestion. No sore throat. NECK: No stiffness. No swollen glands. RESPIRATORY: No cough. No shortness of breath. No sputum production. CARDIAC: No chest pains or palpitations. GI: No nausea. No vomiting. No abdominal pain. No change in his bowels. GU: No change in his urine. MUSCULOSKELETAL: Other than the pain, swelling, and redness in the right lower extremity, he has had no other joint or muscle complaints other than his back. NEUROLOGIC: No focal weakness. He has had some neuropathic pain related to his disk disease. PSYCHIATRIC: No complaints. All other systems are negative.   PHYSICAL EXAMINATION:   VITAL SIGNS: T-max 98.7, T-current 97.8, pulse 72, blood pressure 135/74, 100% on room air.   GENERAL: 64 year old obese white man in no acute distress.   HEENT: Normocephalic, atraumatic. Pupils equal and reactive to light. Extraocular motion intact. Sclerae, conjunctivae, and lids are without evidence for emboli or petechiae. Oropharynx shows no erythema or exudate. Teeth and gums are in fair condition with several teeth missing.   NECK: Supple. Full range of motion. Midline trachea. No lymphadenopathy. No thyromegaly.   LUNGS: Clear to auscultation bilaterally with good air movement. No focal consolidation.   CARDIAC: Regular rate and rhythm without murmur, rub, or gallop.   ABDOMEN: Soft, nontender, and nondistended. No hepatosplenomegaly. No hernia is noted.   EXTREMITIES: No evidence for tenosynovitis. There is no significant swelling bilaterally.   SKIN: His right foot was wrapped in bandages. There was no significant erythema going up the leg.  There was no evidence for lymphangitic streaking. There were no stigmata of endocarditis, specifically no Janeway lesions or Osler nodes.   NEUROLOGIC: The patient is awake and interactive, moving all four extremities.   PSYCHIATRIC: Mood and affect appeared normal.   LABORATORY, DIAGNOSTIC, AND  RADIOLOGICAL DATA: BUN 10, creatinine 0.69, bicarbonate 28, anion gap 10. LFTs from admission were within normal limits. White count 5.9 with hemoglobin 10.5, platelet count 210, ANC 4.2. White count on admission was 7.1 with an ANC of 5.2. Blood cultures from admission show no growth. A wound culture from the ER had moderate growth of a gram-positive rod. There is a second organism that has not yet been identified. A wound culture from January 16th has a gram-positive cocci, a gram-negative rod, and a third unidentified organism that is pending identification. An intraoperative wound culture is currently pending. A left foot x-ray showed decreased mineralization of the distal portion and half of the distal phalanx of the great toe. Left lower extremity ultrasound was unremarkable. Bone scan demonstrated evidence for osteomyelitis of the great toe.   IMPRESSION: This is a 64 year old white man with a history of diabetes admitted with left great toe osteomyelitis status post amputation.   RECOMMENDATIONS:  1. He is status post amputation. He will likely need six weeks of IV therapy. 2. Would continue the vancomycin and Zosyn until the cultures return. We will need to adjust the antibiotics based on the results.  3. Will get a PICC line placed.  4. Will get a sed rate and C-reactive protein. Will follow these and a CBC while on therapy.  5. I will see him weekly as an outpatient while on antibiotics.   This is a moderately complex Infectious Disease consult. Thank you very much for involving me in [name]' care.  ____________________________ Heinz Knuckles. Jashae Wiggs, MD meb:drc D: 04/03/2011 14:49:51 ET T: 04/03/2011 15:51:52 ET JOB#: [id number]  cc: Heinz Knuckles. Avary Eichenberger, MD, <Dictator> Cortlyn Cannell E Kiarra Kidd MD ELECTRONICALLY SIGNED 04/08/2011 15:31

## 2014-07-08 NOTE — Op Note (Signed)
PATIENT NAME:  [name], [name] MR#:  [id number] DATE OF BIRTH:  Feb 11, 1951  DATE OF PROCEDURE:  04/03/2011  PREOPERATIVE DIAGNOSIS: Osteomyelitis distal phalanx, left hallux.   POSTOPERATIVE DIAGNOSIS: Osteomyelitis distal phalanx, left hallux.   PROCEDURE: Amputation distal phalanx left hallux.   SURGEON: Gerrit Heck. Kamile Fassler, DPM  ASSISTANT: None.   HISTORY OF PRESENT ILLNESS: Patient developed a diabetic ulceration tip of his left toe. He has got a lot of neuropathy, didn't really feel it as it was developing, just came to the Emergency Room when he saw his leg swollen up but extensively. He has been improving with swelling for the last couple of days but x-rays and bone scans indicated demineralization osteomyelitis to the left distal phalanx. Wound also probed all the way down to bone.   ANESTHESIA: Local with MAC.  ANESTHESIOLOGIST: Dr. Andree Elk.  ESTIMATED BLOOD LOSS: Negligible.   HEMOSTASIS: Ankle tourniquet to 250 mmHg pressure for 10 minutes.   DESCRIPTION OF PROCEDURE: Patient brought to the Operating Room and placed on the Operating Room table in supine position. At this point after local anesthesia and MAC was achieved by the anesthesia team, patient was then prepped and draped in usual sterile manner. At this time attention was directed to the distal left great toe. A fishmouth incision was made at the distal tip of it removing the nail and the distal portion of the skin of the dorsal distal toe. There was an ulcer approximately 6 to 7 mm in diameter distal tip of the toe. The ulcer was mostly excised with a wedge-type incision across the region. The damaged tissue was removed in toto. The distal phalanx was then dissected away from soft tissue and removed in toto at the IP joint. There was a IP joint sesamoid that appeared to be prominent and I felt like we needed to go ahead and remove that and that was accomplished as well. The bone was cultured at the tip of the toe where the  demineralization had occurred across the distal phalanx area. This was also sent to pathology for evaluation. All infected soft tissue was debrided and cleansed. The area was copiously irrigated with sterile saline solution on multiple occasions during the course of closure. Tourniquet was released once the toe was amputated and before irrigation was achieved. Good bleeding was achieved, particularly at the dorsal and mediolateral aspects. The distal aspect was a little slower to re-establish flow but hopefully will go on to do fairly well. He does have a palpable pulse on his DP pulse area on that left foot. After copious irrigation, the long extensor and long flexor tendon were then sutured together at the tip of the proximal phalanx. Soft tissue was closed with 3-0 Vicryl simple interrupted sutures also. Skin was then closed with 3-0 nylon simple interrupted sutures in one horizontal mattress suture at the wedge site to create a T-type incision across that region. A small amount of packing was placed in the distal wound in between sutures. The area was then dressed with sterile wrap consisting of Xeroform gauze, 4 x 4's, Kling and Kerlix. Patient tolerated procedure and anesthesia well. Left the Operating Room for recovery room vital signs stable and neurovascular status intact.   ____________________________ Gerrit Heck. Ghali Morissette, DPM mgt:cms D: 04/03/2011 08:51:02 ET T: 04/03/2011 09:10:45 ET JOB#: [id number]  cc: Gerrit Heck Chaitanya Amedee, DPM, <Dictator> Perry Mount MD ELECTRONICALLY SIGNED 04/30/2011 11:30

## 2014-07-08 NOTE — Consult Note (Signed)
Chief Complaint:   Subjective/Chief Complaint c/o nausea. no flank or abd pain   VITAL SIGNS/ANCILLARY NOTES: **Vital Signs.:   23-Jan-13 14:11   Vital Signs Type Routine   Temperature Temperature (F) 99   Celsius 37.2   Temperature Source oral   Pulse Pulse 62   Pulse source per Dinamap   Respirations Respirations 18   Systolic BP Systolic BP 051   Diastolic BP (mmHg) Diastolic BP (mmHg) 66   Mean BP 92   BP Source Dinamap   Pulse Ox % Pulse Ox % 97   Pulse Ox Activity Level  At rest   Oxygen Delivery Room Air/ 21 %   Assessment/Plan:  Assessment/Plan:   Assessment Acute renal failure- probable parapelvic cysts and not hydronephrosis although cannot completely exclude without retrograde pyelograms.    Plan discussed with Dr. Holley Raring who feels ARF is multifactorial.  If hydronephrosis was present stent placement could potentially prevent the need for dialysis.  Discussed with pt and he agrees to proceed.  Indication, nature and risks discussed.  If there is any evidence of obstruction will place stents.  All of his questions were answered.   Electronic Signatures: Abbie Sons (MD)  (Signed 23-Jan-13 16:19)  Authored: Chief Complaint, VITAL SIGNS/ANCILLARY NOTES, Assessment/Plan   Last Updated: 23-Jan-13 16:19 by Abbie Sons (MD)

## 2014-07-08 NOTE — Consult Note (Signed)
Details:    - no post op complaints after cysto/retrogrades Will sign off   Electronic Signatures: Abbie Sons (MD)  (Signed 25-Jan-13 14:09)  Authored: Details   Last Updated: 25-Jan-13 14:09 by Abbie Sons (MD)

## 2014-10-30 DIAGNOSIS — E1165 Type 2 diabetes mellitus with hyperglycemia: Secondary | ICD-10-CM | POA: Insufficient documentation

## 2015-08-20 DIAGNOSIS — C439 Malignant melanoma of skin, unspecified: Secondary | ICD-10-CM

## 2015-08-20 HISTORY — DX: Malignant melanoma of skin, unspecified: C43.9

## 2015-10-01 DIAGNOSIS — C434 Malignant melanoma of scalp and neck: Secondary | ICD-10-CM | POA: Insufficient documentation

## 2016-09-10 DIAGNOSIS — R0602 Shortness of breath: Secondary | ICD-10-CM | POA: Insufficient documentation

## 2017-03-19 DIAGNOSIS — R1084 Generalized abdominal pain: Secondary | ICD-10-CM | POA: Diagnosis not present

## 2017-03-19 DIAGNOSIS — Z125 Encounter for screening for malignant neoplasm of prostate: Secondary | ICD-10-CM | POA: Diagnosis not present

## 2017-03-19 DIAGNOSIS — Z131 Encounter for screening for diabetes mellitus: Secondary | ICD-10-CM | POA: Diagnosis not present

## 2017-04-09 DIAGNOSIS — I25118 Atherosclerotic heart disease of native coronary artery with other forms of angina pectoris: Secondary | ICD-10-CM | POA: Diagnosis not present

## 2017-04-09 DIAGNOSIS — R0602 Shortness of breath: Secondary | ICD-10-CM | POA: Diagnosis not present

## 2017-04-22 DIAGNOSIS — I10 Essential (primary) hypertension: Secondary | ICD-10-CM | POA: Diagnosis not present

## 2017-04-22 DIAGNOSIS — I5022 Chronic systolic (congestive) heart failure: Secondary | ICD-10-CM | POA: Diagnosis not present

## 2017-04-22 DIAGNOSIS — I25118 Atherosclerotic heart disease of native coronary artery with other forms of angina pectoris: Secondary | ICD-10-CM | POA: Diagnosis not present

## 2017-04-22 DIAGNOSIS — E782 Mixed hyperlipidemia: Secondary | ICD-10-CM | POA: Diagnosis not present

## 2017-06-17 DIAGNOSIS — I2510 Atherosclerotic heart disease of native coronary artery without angina pectoris: Secondary | ICD-10-CM | POA: Diagnosis not present

## 2017-06-17 DIAGNOSIS — I5022 Chronic systolic (congestive) heart failure: Secondary | ICD-10-CM | POA: Diagnosis not present

## 2017-06-17 DIAGNOSIS — E782 Mixed hyperlipidemia: Secondary | ICD-10-CM | POA: Diagnosis not present

## 2017-06-17 DIAGNOSIS — I10 Essential (primary) hypertension: Secondary | ICD-10-CM | POA: Diagnosis not present

## 2017-07-08 DIAGNOSIS — E1165 Type 2 diabetes mellitus with hyperglycemia: Secondary | ICD-10-CM | POA: Diagnosis not present

## 2017-07-08 DIAGNOSIS — Z9114 Patient's other noncompliance with medication regimen: Secondary | ICD-10-CM | POA: Diagnosis not present

## 2017-07-08 DIAGNOSIS — E1142 Type 2 diabetes mellitus with diabetic polyneuropathy: Secondary | ICD-10-CM | POA: Diagnosis not present

## 2017-07-08 DIAGNOSIS — Z794 Long term (current) use of insulin: Secondary | ICD-10-CM | POA: Diagnosis not present

## 2017-07-08 DIAGNOSIS — E113293 Type 2 diabetes mellitus with mild nonproliferative diabetic retinopathy without macular edema, bilateral: Secondary | ICD-10-CM | POA: Diagnosis not present

## 2017-07-29 DIAGNOSIS — E113293 Type 2 diabetes mellitus with mild nonproliferative diabetic retinopathy without macular edema, bilateral: Secondary | ICD-10-CM | POA: Diagnosis not present

## 2017-10-27 ENCOUNTER — Encounter: Payer: Self-pay | Admitting: Emergency Medicine

## 2017-10-27 ENCOUNTER — Emergency Department
Admission: EM | Admit: 2017-10-27 | Discharge: 2017-10-27 | Discharge status: Against Medical Advice | Payer: Medicare HMO | Attending: Emergency Medicine | Admitting: Emergency Medicine

## 2017-10-27 ENCOUNTER — Other Ambulatory Visit: Payer: Self-pay

## 2017-10-27 ENCOUNTER — Emergency Department: Payer: Medicare HMO

## 2017-10-27 DIAGNOSIS — Z7982 Long term (current) use of aspirin: Secondary | ICD-10-CM | POA: Insufficient documentation

## 2017-10-27 DIAGNOSIS — E119 Type 2 diabetes mellitus without complications: Secondary | ICD-10-CM | POA: Insufficient documentation

## 2017-10-27 DIAGNOSIS — S8992XA Unspecified injury of left lower leg, initial encounter: Secondary | ICD-10-CM | POA: Diagnosis not present

## 2017-10-27 DIAGNOSIS — Z7984 Long term (current) use of oral hypoglycemic drugs: Secondary | ICD-10-CM | POA: Insufficient documentation

## 2017-10-27 DIAGNOSIS — M7989 Other specified soft tissue disorders: Secondary | ICD-10-CM | POA: Diagnosis not present

## 2017-10-27 DIAGNOSIS — I10 Essential (primary) hypertension: Secondary | ICD-10-CM | POA: Diagnosis not present

## 2017-10-27 DIAGNOSIS — Z23 Encounter for immunization: Secondary | ICD-10-CM | POA: Insufficient documentation

## 2017-10-27 DIAGNOSIS — I2510 Atherosclerotic heart disease of native coronary artery without angina pectoris: Secondary | ICD-10-CM | POA: Insufficient documentation

## 2017-10-27 DIAGNOSIS — Z79899 Other long term (current) drug therapy: Secondary | ICD-10-CM | POA: Diagnosis not present

## 2017-10-27 DIAGNOSIS — L97529 Non-pressure chronic ulcer of other part of left foot with unspecified severity: Secondary | ICD-10-CM | POA: Diagnosis not present

## 2017-10-27 DIAGNOSIS — M869 Osteomyelitis, unspecified: Secondary | ICD-10-CM

## 2017-10-27 DIAGNOSIS — L97509 Non-pressure chronic ulcer of other part of unspecified foot with unspecified severity: Secondary | ICD-10-CM | POA: Diagnosis not present

## 2017-10-27 DIAGNOSIS — M79672 Pain in left foot: Secondary | ICD-10-CM | POA: Diagnosis present

## 2017-10-27 DIAGNOSIS — M86172 Other acute osteomyelitis, left ankle and foot: Secondary | ICD-10-CM | POA: Insufficient documentation

## 2017-10-27 DIAGNOSIS — M79675 Pain in left toe(s): Secondary | ICD-10-CM | POA: Diagnosis not present

## 2017-10-27 LAB — CBC WITH DIFFERENTIAL/PLATELET
Basophils Absolute: 0 10*3/uL (ref 0–0.1)
Basophils Relative: 1 %
Eosinophils Absolute: 0.1 10*3/uL (ref 0–0.7)
Eosinophils Relative: 1 %
HCT: 33.3 % — ABNORMAL LOW (ref 40.0–52.0)
HEMOGLOBIN: 11.5 g/dL — AB (ref 13.0–18.0)
LYMPHS ABS: 0.7 10*3/uL — AB (ref 1.0–3.6)
LYMPHS PCT: 12 %
MCH: 29.2 pg (ref 26.0–34.0)
MCHC: 34.5 g/dL (ref 32.0–36.0)
MCV: 84.6 fL (ref 80.0–100.0)
MONO ABS: 0.5 10*3/uL (ref 0.2–1.0)
MONOS PCT: 8 %
NEUTROS ABS: 4.8 10*3/uL (ref 1.4–6.5)
NEUTROS PCT: 78 %
Platelets: 173 10*3/uL (ref 150–440)
RBC: 3.93 MIL/uL — ABNORMAL LOW (ref 4.40–5.90)
RDW: 15.3 % — AB (ref 11.5–14.5)
WBC: 6.2 10*3/uL (ref 3.8–10.6)

## 2017-10-27 LAB — COMPREHENSIVE METABOLIC PANEL
ALBUMIN: 3.6 g/dL (ref 3.5–5.0)
ALT: 20 U/L (ref 0–44)
ANION GAP: 10 (ref 5–15)
AST: 22 U/L (ref 15–41)
Alkaline Phosphatase: 83 U/L (ref 38–126)
BUN: 18 mg/dL (ref 8–23)
CHLORIDE: 104 mmol/L (ref 98–111)
CO2: 25 mmol/L (ref 22–32)
Calcium: 8.6 mg/dL — ABNORMAL LOW (ref 8.9–10.3)
Creatinine, Ser: 1 mg/dL (ref 0.61–1.24)
GFR calc non Af Amer: >60 mL/min (ref >60)
GLUCOSE: 332 mg/dL — AB (ref 70–99)
POTASSIUM: 4 mmol/L (ref 3.5–5.1)
SODIUM: 139 mmol/L (ref 135–145)
Total Bilirubin: 1.6 mg/dL — ABNORMAL HIGH (ref 0.3–1.2)
Total Protein: 6.5 g/dL (ref 6.5–8.1)

## 2017-10-27 LAB — GLUCOSE, CAPILLARY: Glucose-Capillary: 304 mg/dL — ABNORMAL HIGH (ref 70–99)

## 2017-10-27 MED ORDER — SODIUM CHLORIDE 0.9 % IV SOLN: 2.0000 g | INTRAVENOUS | Status: DC

## 2017-10-27 MED ORDER — CIPROFLOXACIN HCL 750 MG PO TABS: 750.0000 mg | ORAL_TABLET | Freq: Two times a day (BID) | ORAL | 0 refills | Status: DC

## 2017-10-27 MED ORDER — TETANUS-DIPHTH-ACELL PERTUSSIS 5-2.5-18.5 LF-MCG/0.5 IM SUSP
0.5000 mL | Freq: Once | INTRAMUSCULAR | Status: AC
  Administered 2017-10-27: 0.5 mL via INTRAMUSCULAR
  Filled 2017-10-27: qty 0.5

## 2017-10-27 MED ORDER — SULFAMETHOXAZOLE-TRIMETHOPRIM 800-160 MG PO TABS
1.0000 | ORAL_TABLET | Freq: Once | ORAL | Status: AC
  Administered 2017-10-27: 1 via ORAL
  Filled 2017-10-27: qty 1

## 2017-10-27 MED ORDER — VANCOMYCIN HCL 10 G IV SOLR
2000.0000 mg | Freq: Once | INTRAVENOUS | Status: DC
  Filled 2017-10-27: qty 2000

## 2017-10-27 MED ORDER — CLINDAMYCIN HCL 150 MG PO CAPS: 450.0000 mg | ORAL_CAPSULE | Freq: Three times a day (TID) | ORAL | 0 refills | Status: DC

## 2017-10-27 MED ORDER — CIPROFLOXACIN HCL 500 MG PO TABS
750.0000 mg | ORAL_TABLET | Freq: Once | ORAL | Status: AC
  Administered 2017-10-27: 750 mg via ORAL
  Filled 2017-10-27: qty 2

## 2017-10-27 MED ORDER — CEPHALEXIN 500 MG PO CAPS
500.0000 mg | ORAL_CAPSULE | Freq: Once | ORAL | Status: AC
  Administered 2017-10-27: 500 mg via ORAL
  Filled 2017-10-27: qty 1

## 2017-10-27 MED ORDER — CLINDAMYCIN HCL 150 MG PO CAPS
450.0000 mg | ORAL_CAPSULE | Freq: Once | ORAL | Status: AC
  Administered 2017-10-27: 450 mg via ORAL
  Filled 2017-10-27: qty 3

## 2017-10-27 NOTE — ED Notes (Signed)
Pt denies fevers at home but reports Saturday he hit his left foot on the door and has since had swelling and discharge from the second left toe. Yellow drainage noted upon assessment with foul odor present. Redness and swelling in toe and progressing up left leg. No red streaking and no increased warmth noted. Left pedal pulse is present and strong.   Toe nail on left second toe is also broken but pt reports that is a chronic nail problem and denies that it was caused with the injury.   CBG is also elevated in triage but pt verbalized he has missed both his 7:00am and noon medications today and believes that is the cause.

## 2017-10-27 NOTE — ED Notes (Signed)
Patient transported to Ultrasound in NAD.  

## 2017-10-27 NOTE — ED Triage Notes (Signed)
Pt arrived via POV from home with reports of left first toe wound and left leg 3+ pitting edema.  Pt states he injured his toe on Saturday afternoon by stubbing on threshold. Pt states now toe is red and has ulceration to lateral side of the toe.  Drainage present on dressing. Pt also has significant pitting edema in left leg from the foot up through the calf. Pt states he takes furosemide daily.   Pt denies any hx of blood clots or hx of HF.

## 2017-10-27 NOTE — Discharge Instructions (Addendum)
Your xray today is worrisome for infection in the toe extending into the bone.  Take oral antibiotics as prescribed and follow up with Dr. Elvina Mattes as scheduled.

## 2017-10-27 NOTE — ED Provider Notes (Signed)
[facility] Emergency Department Provider Note  ____________________________________________  Time seen: Approximately 7:05 PM  I have reviewed the triage vital signs and the nursing notes.   HISTORY  Chief Complaint Wound Infection (Diabetic - Left first toe) and Leg Swelling    HPI [name] is a 67 y.o. male with a history of CAD, hypertension, diabetes who reports pain and swelling of the left second toe for the past 4 days.  Gradual onset after accidentally hitting his toe on a chair.  Since then has also had worsening swelling of the left foot ankle and lower leg.  Denies fevers chills chest pain or shortness of breath.  Pain is constant, worsening, worse with walking, no alleviating factors, moderate intensity.  Nonradiating.     Past Medical History:  Diagnosis Date  . Arthritis   . Coronary artery disease    Dr. [facility]  . Diabetes mellitus without complication ([place])   . Hyperlipidemia   . Hypertension   . Numbness in right leg    due to back  . Skin cancer    removed l arm  . Spinal stenosis   . Stented coronary artery 2000   X 1 STENT     Patient Active Problem List   Diagnosis Date Noted  . Lumbar spinal stenosis 04/13/2012     Past Surgical History:  Procedure Laterality Date  . CHOLECYSTECTOMY    . JOINT REPLACEMENT     RT KNEE  . LUMBAR LAMINECTOMY/DECOMPRESSION MICRODISCECTOMY  04/13/2012   Procedure: LUMBAR LAMINECTOMY/DECOMPRESSION MICRODISCECTOMY 1 LEVEL;  Surgeon: Johnn Hai, MD;  Location: [facility] ORS;  Service: Orthopedics;  Laterality: Bilateral;  L4-L5  . UVULECTOMY  1990's     Prior to Admission medications   Medication Sig Start Date End Date Taking? Authorizing Provider  aspirin 325 MG tablet Take 1 tablet (325 mg total) by mouth daily. 04/17/12   Cecilie Kicks, PA-C  atorvastatin (LIPITOR) 40 MG tablet Take 40 mg by mouth every evening.    [provider]  glipiZIDE  (GLUCOTROL XL) 5 MG 24 hr tablet Take 5 mg by mouth 2 (two) times daily.    [provider]  losartan (COZAAR) 50 MG tablet Take 50 mg by mouth every evening.    [provider]  metFORMIN (GLUCOPHAGE) 1000 MG tablet Take 1,000 mg by mouth 2 (two) times daily with a meal.    [provider]  methocarbamol (ROBAXIN) 500 MG tablet Take 1 tablet (500 mg total) by mouth 3 (three) times daily between meals as needed. 04/13/12   Susa Day, MD  naproxen sodium (ANAPROX) 220 MG tablet Take 220 mg by mouth 2 (two) times daily with a meal.    [provider]  oxyCODONE-acetaminophen (PERCOCET) 5-325 MG per tablet Take 1-2 tablets by mouth every 4 (four) hours as needed for pain. 04/13/12   Susa Day, MD     Allergies Metoprolol; Toprol xl [metoprolol tartrate]; Buprenorphine hcl; Morphine; Morphine and related; and Triamcinolone acetonide   History reviewed. No pertinent family history.  Social History Social History   Tobacco Use  . Smoking status: Never Smoker  . Smokeless tobacco: Never Used  Substance Use Topics  . Alcohol use: Yes    Comment: rare  . Drug use: No    Review of Systems  Constitutional:   No fever or chills.  Cardiovascular:   No chest pain or syncope. Respiratory:   No dyspnea or cough. Gastrointestinal:   Negative for  abdominal pain, vomiting and diarrhea.  Musculoskeletal: Left toe and foot pain as above.  Left lower leg swelling. all other systems reviewed and are negative except as documented above in ROS and HPI.  ____________________________________________   PHYSICAL EXAM:  VITAL SIGNS: ED Triage Vitals [10/27/17 1338]  Enc Vitals Group     BP (!) 152/56     Pulse Rate 95     Resp 18     Temp 98.2 F (36.8 C)     Temp Source Oral     SpO2 96 %     Weight 275 lb (124.7 kg)     Height 6' (1.829 m)     Head Circumference      Peak Flow      Pain Score 7     Pain Loc      Pain Edu?      Excl. in [place]?      Vital signs reviewed, nursing assessments reviewed.   Constitutional:   Alert and oriented. Non-toxic appearance. Eyes:   Conjunctivae are normal. EOMI. ENT      Head:   Normocephalic and atraumatic.      Nose:   No congestion/rhinnorhea.       Mouth/Throat:   MMM, no pharyngeal erythema. No peritonsillar mass.       Neck:   No meningismus. Full ROM. Hematological/Lymphatic/Immunilogical:   No cervical lymphadenopathy. Cardiovascular:   RRR. Symmetric bilateral radial and DP pulses.  No murmurs. Cap refill less than 2 seconds. Respiratory:   Normal respiratory effort without tachypnea/retractions. Breath sounds are clear and equal bilaterally. No wheezes/rales/rhonchi. Gastrointestinal:   Soft and nontender. Non distended. There is no CVA tenderness.  No rebound, rigidity, or guarding. Musculoskeletal:   Substantial swelling erythema and warmth of the left second toe.  Not a sausage digit.  No flexor tendon tenderness.  On the lateral distal aspect there is a 1 cm macerated wound with a slight amount of purulent drainage when expressed.  Purulent drainage is also expressed from the distal lateral nail edge.  No crepitus in the toe.  A large callus at the tip of the toe was loose, and debrided.  There is also edema diffusely of the left foot ankle and lower leg.  Slight erythema over the distal foot.  No crepitus or induration.  No fluctuance..  Other extremities unremarkable.  Neurologic:   Normal speech and language.  Motor grossly intact. No acute focal neurologic deficits are appreciated.  Skin:    Skin is warm, dry with wounds and inflammatory changes as above. ____________________________________________    LABS (pertinent positives/negatives) (all labs ordered are listed, but only abnormal results are displayed) Labs Reviewed  COMPREHENSIVE METABOLIC PANEL - Abnormal; Notable for the following components:      Result Value   Glucose, Bld 332 (*)    Calcium 8.6 (*)     Total Bilirubin 1.6 (*)    All other components within normal limits  CBC WITH DIFFERENTIAL/PLATELET - Abnormal; Notable for the following components:   RBC 3.93 (*)    Hemoglobin 11.5 (*)    HCT 33.3 (*)    RDW 15.3 (*)    Lymphs Abs 0.7 (*)    All other components within normal limits  GLUCOSE, CAPILLARY - Abnormal; Notable for the following components:   Glucose-Capillary 304 (*)    All other components within normal limits  AEROBIC CULTURE (SUPERFICIAL SPECIMEN)  CBG MONITORING, ED   ____________________________________________   EKG    ____________________________________________  RADIOLOGY  US Venous Img Lower Unilateral Left  Result Date: 10/27/2017 CLINICAL DATA:  Left toe trauma and discoloration EXAM: LEFT LOWER EXTREMITY VENOUS DOPPLER ULTRASOUND TECHNIQUE: Gray-scale sonography with compression, as well as color and duplex ultrasound, were performed to evaluate the deep venous system from the level of the common femoral vein through the popliteal and proximal calf veins. COMPARISON:  None FINDINGS: Normal compressibility of the common femoral, superficial femoral, and popliteal veins, as well as the proximal calf veins. No filling defects to suggest DVT on grayscale or color Doppler imaging. Doppler waveforms show normal direction of venous flow, normal respiratory phasicity and response to augmentation. Subcutaneous edema in the calf. Survey views of the contralateral common femoral vein are unremarkable. IMPRESSION: No evidence of left lower extremity deep vein thrombosis. Electronically Signed   By: Lucrezia Europe M.D.   On: 10/27/2017 18:51   Dg Toe 2nd Left  Result Date: 10/27/2017 CLINICAL DATA:  Initial evaluation for acute trauma, draining wound. EXAM: LEFT SECOND TOE COMPARISON:  None. FINDINGS: Soft tissue irregularity at the distal aspect of the left second digit suspicious for ulceration/wound. Mild osseous erosion with periosteal reaction of the underlying  distal phalanx suspicious for possible osteomyelitis. No soft tissue emphysema. No radiopaque foreign body. No acute fracture or dislocation. IMPRESSION: 1. Soft tissue ulceration at the distal aspect of the left second digit. Subtle osseous erosion with periosteal reaction within the underlying second distal phalanx suspicious for osteomyelitis. 2. No acute fracture or dislocation. Electronically Signed   By: Jeannine Boga M.D.   On: 10/27/2017 18:23    ____________________________________________   PROCEDURES Procedures  ____________________________________________    CLINICAL IMPRESSION / ASSESSMENT AND PLAN / ED COURSE  Pertinent labs & imaging results that were available during my care of the patient were reviewed by me and considered in my medical decision making (see chart for details).    Patient presents with injury to the left second toe.  On exam, the toe is infected and consistent with cellulitis and a small subcutaneous abscess that is draining spontaneously.  X-rays obtained which does show a periosteal reaction, consistent with osteomyelitis.  Recommended admission and IV antibiotics, but the patient states that staying in the hospital is not a possibility right now because his wife is dying of cancer and just entered hospice 5 days ago.  I will discuss with podiatry.  Ultrasound left lower extremity negative for DVT.  Clinical Course as of Oct 27 1944  Wed Oct 27, 2017  1936 Care d/w Dr. Elvina Mattes, who feels that outpatient follow up would be reasonable, though less than ideal. Will DC on cipro and clindamycin to cover mrsa, strep, pseudomonas. Wound culture sent.   [PS]    Clinical Course User Index [PS] Carrie Mew, MD   Give a dose of Cipro and clindamycin here in the ED prior to discharge, will discharge home [place] in stable condition.  Patient does have medical decision-making  capacity.  ____________________________________________   FINAL CLINICAL IMPRESSION(S) / ED DIAGNOSES    Final diagnoses:  Osteomyelitis of second toe of left foot [facility])     ED Discharge Orders    None      Portions of this note were generated with dragon dictation software. Dictation errors may occur despite best attempts at proofreading.    Carrie Mew, MD 10/27/17 [phone]

## 2017-10-27 NOTE — ED Notes (Signed)
Pt verbalized understanding for follow up care. NAD

## 2017-10-30 LAB — AEROBIC CULTURE  (SUPERFICIAL SPECIMEN)

## 2017-10-30 LAB — AEROBIC CULTURE W GRAM STAIN (SUPERFICIAL SPECIMEN): Culture: NORMAL

## 2017-11-02 DIAGNOSIS — L97521 Non-pressure chronic ulcer of other part of left foot limited to breakdown of skin: Secondary | ICD-10-CM | POA: Diagnosis not present

## 2017-11-02 DIAGNOSIS — E1142 Type 2 diabetes mellitus with diabetic polyneuropathy: Secondary | ICD-10-CM | POA: Diagnosis not present

## 2017-11-02 DIAGNOSIS — S92535A Nondisplaced fracture of distal phalanx of left lesser toe(s), initial encounter for closed fracture: Secondary | ICD-10-CM | POA: Diagnosis not present

## 2017-11-10 DIAGNOSIS — E1142 Type 2 diabetes mellitus with diabetic polyneuropathy: Secondary | ICD-10-CM | POA: Diagnosis not present

## 2017-11-10 DIAGNOSIS — S92535A Nondisplaced fracture of distal phalanx of left lesser toe(s), initial encounter for closed fracture: Secondary | ICD-10-CM | POA: Diagnosis not present

## 2017-11-10 DIAGNOSIS — L97521 Non-pressure chronic ulcer of other part of left foot limited to breakdown of skin: Secondary | ICD-10-CM | POA: Diagnosis not present

## 2017-11-11 DIAGNOSIS — E1165 Type 2 diabetes mellitus with hyperglycemia: Secondary | ICD-10-CM | POA: Diagnosis not present

## 2017-11-11 DIAGNOSIS — Z794 Long term (current) use of insulin: Secondary | ICD-10-CM | POA: Diagnosis not present

## 2017-11-17 DIAGNOSIS — L97511 Non-pressure chronic ulcer of other part of right foot limited to breakdown of skin: Secondary | ICD-10-CM | POA: Diagnosis not present

## 2017-11-17 DIAGNOSIS — E1142 Type 2 diabetes mellitus with diabetic polyneuropathy: Secondary | ICD-10-CM | POA: Diagnosis not present

## 2017-11-17 DIAGNOSIS — M2041 Other hammer toe(s) (acquired), right foot: Secondary | ICD-10-CM | POA: Diagnosis not present

## 2017-11-17 DIAGNOSIS — S92535A Nondisplaced fracture of distal phalanx of left lesser toe(s), initial encounter for closed fracture: Secondary | ICD-10-CM | POA: Diagnosis not present

## 2017-11-17 DIAGNOSIS — M2031 Hallux varus (acquired), right foot: Secondary | ICD-10-CM | POA: Diagnosis not present

## 2017-11-17 DIAGNOSIS — Z89412 Acquired absence of left great toe: Secondary | ICD-10-CM | POA: Diagnosis not present

## 2017-11-17 DIAGNOSIS — M2042 Other hammer toe(s) (acquired), left foot: Secondary | ICD-10-CM | POA: Diagnosis not present

## 2017-11-17 DIAGNOSIS — L97521 Non-pressure chronic ulcer of other part of left foot limited to breakdown of skin: Secondary | ICD-10-CM | POA: Diagnosis not present

## 2017-11-18 DIAGNOSIS — E113293 Type 2 diabetes mellitus with mild nonproliferative diabetic retinopathy without macular edema, bilateral: Secondary | ICD-10-CM | POA: Diagnosis not present

## 2017-11-18 DIAGNOSIS — E1169 Type 2 diabetes mellitus with other specified complication: Secondary | ICD-10-CM | POA: Diagnosis not present

## 2017-11-18 DIAGNOSIS — E669 Obesity, unspecified: Secondary | ICD-10-CM | POA: Diagnosis not present

## 2017-11-18 DIAGNOSIS — E1142 Type 2 diabetes mellitus with diabetic polyneuropathy: Secondary | ICD-10-CM | POA: Diagnosis not present

## 2017-11-18 DIAGNOSIS — E1159 Type 2 diabetes mellitus with other circulatory complications: Secondary | ICD-10-CM | POA: Diagnosis not present

## 2017-11-18 DIAGNOSIS — E1165 Type 2 diabetes mellitus with hyperglycemia: Secondary | ICD-10-CM | POA: Diagnosis not present

## 2017-11-18 DIAGNOSIS — I10 Essential (primary) hypertension: Secondary | ICD-10-CM | POA: Diagnosis not present

## 2017-11-18 DIAGNOSIS — Z794 Long term (current) use of insulin: Secondary | ICD-10-CM | POA: Diagnosis not present

## 2017-12-08 DIAGNOSIS — E1142 Type 2 diabetes mellitus with diabetic polyneuropathy: Secondary | ICD-10-CM | POA: Diagnosis not present

## 2017-12-08 DIAGNOSIS — L97521 Non-pressure chronic ulcer of other part of left foot limited to breakdown of skin: Secondary | ICD-10-CM | POA: Diagnosis not present

## 2017-12-16 DIAGNOSIS — I10 Essential (primary) hypertension: Secondary | ICD-10-CM | POA: Diagnosis not present

## 2017-12-16 DIAGNOSIS — I5022 Chronic systolic (congestive) heart failure: Secondary | ICD-10-CM | POA: Diagnosis not present

## 2017-12-16 DIAGNOSIS — R0602 Shortness of breath: Secondary | ICD-10-CM | POA: Diagnosis not present

## 2017-12-16 DIAGNOSIS — E1159 Type 2 diabetes mellitus with other circulatory complications: Secondary | ICD-10-CM | POA: Diagnosis not present

## 2017-12-16 DIAGNOSIS — I2510 Atherosclerotic heart disease of native coronary artery without angina pectoris: Secondary | ICD-10-CM | POA: Diagnosis not present

## 2017-12-16 DIAGNOSIS — E782 Mixed hyperlipidemia: Secondary | ICD-10-CM | POA: Diagnosis not present

## 2018-02-16 DIAGNOSIS — Z794 Long term (current) use of insulin: Secondary | ICD-10-CM | POA: Diagnosis not present

## 2018-02-16 DIAGNOSIS — E1165 Type 2 diabetes mellitus with hyperglycemia: Secondary | ICD-10-CM | POA: Diagnosis not present

## 2018-02-23 DIAGNOSIS — E1165 Type 2 diabetes mellitus with hyperglycemia: Secondary | ICD-10-CM | POA: Diagnosis not present

## 2018-02-23 DIAGNOSIS — Z794 Long term (current) use of insulin: Secondary | ICD-10-CM | POA: Diagnosis not present

## 2018-02-23 DIAGNOSIS — E1169 Type 2 diabetes mellitus with other specified complication: Secondary | ICD-10-CM | POA: Diagnosis not present

## 2018-02-23 DIAGNOSIS — E1159 Type 2 diabetes mellitus with other circulatory complications: Secondary | ICD-10-CM | POA: Diagnosis not present

## 2018-02-23 DIAGNOSIS — E113293 Type 2 diabetes mellitus with mild nonproliferative diabetic retinopathy without macular edema, bilateral: Secondary | ICD-10-CM | POA: Diagnosis not present

## 2018-02-23 DIAGNOSIS — E669 Obesity, unspecified: Secondary | ICD-10-CM | POA: Diagnosis not present

## 2018-02-23 DIAGNOSIS — E1142 Type 2 diabetes mellitus with diabetic polyneuropathy: Secondary | ICD-10-CM | POA: Diagnosis not present

## 2018-02-23 DIAGNOSIS — I10 Essential (primary) hypertension: Secondary | ICD-10-CM | POA: Diagnosis not present

## 2018-03-14 DIAGNOSIS — B351 Tinea unguium: Secondary | ICD-10-CM | POA: Diagnosis not present

## 2018-03-14 DIAGNOSIS — E1142 Type 2 diabetes mellitus with diabetic polyneuropathy: Secondary | ICD-10-CM | POA: Diagnosis not present

## 2018-03-14 DIAGNOSIS — L851 Acquired keratosis [keratoderma] palmaris et plantaris: Secondary | ICD-10-CM | POA: Diagnosis not present

## 2018-03-29 DIAGNOSIS — M25551 Pain in right hip: Secondary | ICD-10-CM | POA: Diagnosis not present

## 2018-03-29 DIAGNOSIS — M1611 Unilateral primary osteoarthritis, right hip: Secondary | ICD-10-CM | POA: Diagnosis not present

## 2018-04-12 DIAGNOSIS — M25551 Pain in right hip: Secondary | ICD-10-CM | POA: Insufficient documentation

## 2018-04-13 DIAGNOSIS — M25551 Pain in right hip: Secondary | ICD-10-CM | POA: Diagnosis not present

## 2018-04-29 DIAGNOSIS — D229 Melanocytic nevi, unspecified: Secondary | ICD-10-CM | POA: Diagnosis not present

## 2018-04-29 DIAGNOSIS — L820 Inflamed seborrheic keratosis: Secondary | ICD-10-CM | POA: Diagnosis not present

## 2018-04-29 DIAGNOSIS — D485 Neoplasm of uncertain behavior of skin: Secondary | ICD-10-CM | POA: Diagnosis not present

## 2018-04-29 DIAGNOSIS — L720 Epidermal cyst: Secondary | ICD-10-CM | POA: Diagnosis not present

## 2018-04-29 DIAGNOSIS — Z85820 Personal history of malignant melanoma of skin: Secondary | ICD-10-CM | POA: Diagnosis not present

## 2018-04-29 DIAGNOSIS — Z1283 Encounter for screening for malignant neoplasm of skin: Secondary | ICD-10-CM | POA: Diagnosis not present

## 2018-04-29 DIAGNOSIS — L821 Other seborrheic keratosis: Secondary | ICD-10-CM | POA: Diagnosis not present

## 2018-04-29 DIAGNOSIS — Z85828 Personal history of other malignant neoplasm of skin: Secondary | ICD-10-CM | POA: Diagnosis not present

## 2018-04-29 DIAGNOSIS — L080 Pyoderma: Secondary | ICD-10-CM | POA: Diagnosis not present

## 2018-04-29 DIAGNOSIS — D692 Other nonthrombocytopenic purpura: Secondary | ICD-10-CM | POA: Diagnosis not present

## 2018-05-09 DIAGNOSIS — M1611 Unilateral primary osteoarthritis, right hip: Secondary | ICD-10-CM | POA: Insufficient documentation

## 2018-05-10 DIAGNOSIS — M1611 Unilateral primary osteoarthritis, right hip: Secondary | ICD-10-CM | POA: Diagnosis not present

## 2018-05-23 DIAGNOSIS — L720 Epidermal cyst: Secondary | ICD-10-CM | POA: Diagnosis not present

## 2018-05-30 DIAGNOSIS — L720 Epidermal cyst: Secondary | ICD-10-CM | POA: Diagnosis not present

## 2018-07-12 DIAGNOSIS — I10 Essential (primary) hypertension: Secondary | ICD-10-CM | POA: Diagnosis not present

## 2018-07-12 DIAGNOSIS — E1159 Type 2 diabetes mellitus with other circulatory complications: Secondary | ICD-10-CM | POA: Diagnosis not present

## 2018-07-12 DIAGNOSIS — I25118 Atherosclerotic heart disease of native coronary artery with other forms of angina pectoris: Secondary | ICD-10-CM | POA: Diagnosis not present

## 2018-07-12 DIAGNOSIS — I2510 Atherosclerotic heart disease of native coronary artery without angina pectoris: Secondary | ICD-10-CM | POA: Diagnosis not present

## 2018-07-12 DIAGNOSIS — I6523 Occlusion and stenosis of bilateral carotid arteries: Secondary | ICD-10-CM | POA: Diagnosis not present

## 2018-07-12 DIAGNOSIS — I5022 Chronic systolic (congestive) heart failure: Secondary | ICD-10-CM | POA: Diagnosis not present

## 2018-07-12 DIAGNOSIS — E782 Mixed hyperlipidemia: Secondary | ICD-10-CM | POA: Diagnosis not present

## 2018-07-26 DIAGNOSIS — I6523 Occlusion and stenosis of bilateral carotid arteries: Secondary | ICD-10-CM | POA: Diagnosis not present

## 2018-07-26 DIAGNOSIS — I2510 Atherosclerotic heart disease of native coronary artery without angina pectoris: Secondary | ICD-10-CM | POA: Diagnosis not present

## 2018-07-26 DIAGNOSIS — Z89422 Acquired absence of other left toe(s): Secondary | ICD-10-CM | POA: Diagnosis not present

## 2018-07-26 DIAGNOSIS — I5022 Chronic systolic (congestive) heart failure: Secondary | ICD-10-CM | POA: Diagnosis not present

## 2018-07-26 DIAGNOSIS — I10 Essential (primary) hypertension: Secondary | ICD-10-CM | POA: Diagnosis not present

## 2018-11-09 DIAGNOSIS — E1165 Type 2 diabetes mellitus with hyperglycemia: Secondary | ICD-10-CM | POA: Diagnosis not present

## 2018-11-09 DIAGNOSIS — Z794 Long term (current) use of insulin: Secondary | ICD-10-CM | POA: Diagnosis not present

## 2018-11-16 DIAGNOSIS — I10 Essential (primary) hypertension: Secondary | ICD-10-CM | POA: Diagnosis not present

## 2018-11-16 DIAGNOSIS — Z794 Long term (current) use of insulin: Secondary | ICD-10-CM | POA: Diagnosis not present

## 2018-11-16 DIAGNOSIS — E1159 Type 2 diabetes mellitus with other circulatory complications: Secondary | ICD-10-CM | POA: Diagnosis not present

## 2018-11-16 DIAGNOSIS — E1142 Type 2 diabetes mellitus with diabetic polyneuropathy: Secondary | ICD-10-CM | POA: Diagnosis not present

## 2018-11-16 DIAGNOSIS — E113293 Type 2 diabetes mellitus with mild nonproliferative diabetic retinopathy without macular edema, bilateral: Secondary | ICD-10-CM | POA: Diagnosis not present

## 2018-11-16 DIAGNOSIS — E1165 Type 2 diabetes mellitus with hyperglycemia: Secondary | ICD-10-CM | POA: Diagnosis not present

## 2019-01-26 DIAGNOSIS — I5022 Chronic systolic (congestive) heart failure: Secondary | ICD-10-CM | POA: Diagnosis not present

## 2019-01-26 DIAGNOSIS — E782 Mixed hyperlipidemia: Secondary | ICD-10-CM | POA: Diagnosis not present

## 2019-01-26 DIAGNOSIS — I2510 Atherosclerotic heart disease of native coronary artery without angina pectoris: Secondary | ICD-10-CM | POA: Diagnosis not present

## 2019-01-26 DIAGNOSIS — E1159 Type 2 diabetes mellitus with other circulatory complications: Secondary | ICD-10-CM | POA: Diagnosis not present

## 2019-01-26 DIAGNOSIS — I6523 Occlusion and stenosis of bilateral carotid arteries: Secondary | ICD-10-CM | POA: Diagnosis not present

## 2019-04-06 DIAGNOSIS — E119 Type 2 diabetes mellitus without complications: Secondary | ICD-10-CM | POA: Diagnosis not present

## 2019-04-20 DIAGNOSIS — H2511 Age-related nuclear cataract, right eye: Secondary | ICD-10-CM | POA: Diagnosis not present

## 2019-04-20 DIAGNOSIS — I2510 Atherosclerotic heart disease of native coronary artery without angina pectoris: Secondary | ICD-10-CM | POA: Diagnosis not present

## 2019-04-26 ENCOUNTER — Encounter: Payer: Self-pay | Admitting: Ophthalmology

## 2019-05-01 ENCOUNTER — Other Ambulatory Visit
Admission: RE | Admit: 2019-05-01 | Discharge: 2019-05-01 | Disposition: A | Discharge status: Home / Self Care | Payer: Medicare HMO | Source: Ambulatory Visit | Attending: Ophthalmology | Admitting: Ophthalmology

## 2019-05-01 ENCOUNTER — Other Ambulatory Visit: Payer: Self-pay

## 2019-05-01 DIAGNOSIS — Z20822 Contact with and (suspected) exposure to covid-19: Secondary | ICD-10-CM | POA: Diagnosis not present

## 2019-05-01 DIAGNOSIS — Z01812 Encounter for preprocedural laboratory examination: Secondary | ICD-10-CM | POA: Insufficient documentation

## 2019-05-02 LAB — SARS CORONAVIRUS 2 (TAT 6-24 HRS): SARS Coronavirus 2: NEGATIVE

## 2019-05-02 NOTE — Discharge Instructions (Signed)

## 2019-05-03 ENCOUNTER — Encounter: Payer: Self-pay | Admitting: Ophthalmology

## 2019-05-03 ENCOUNTER — Encounter
Admission: RE | Disposition: A | Discharge status: Home / Self Care | Payer: Self-pay | Source: Home / Self Care | Attending: Ophthalmology

## 2019-05-03 ENCOUNTER — Other Ambulatory Visit: Payer: Self-pay

## 2019-05-03 ENCOUNTER — Ambulatory Visit
Admission: RE | Admit: 2019-05-03 | Discharge: 2019-05-03 | Disposition: A | Discharge status: Home / Self Care | Payer: Medicare HMO | Attending: Ophthalmology | Admitting: Ophthalmology

## 2019-05-03 ENCOUNTER — Ambulatory Visit: Payer: Medicare HMO | Admitting: Anesthesiology

## 2019-05-03 DIAGNOSIS — M1990 Unspecified osteoarthritis, unspecified site: Secondary | ICD-10-CM | POA: Diagnosis not present

## 2019-05-03 DIAGNOSIS — H2511 Age-related nuclear cataract, right eye: Secondary | ICD-10-CM | POA: Diagnosis not present

## 2019-05-03 DIAGNOSIS — Z85828 Personal history of other malignant neoplasm of skin: Secondary | ICD-10-CM | POA: Insufficient documentation

## 2019-05-03 DIAGNOSIS — Z96651 Presence of right artificial knee joint: Secondary | ICD-10-CM | POA: Diagnosis not present

## 2019-05-03 DIAGNOSIS — E1136 Type 2 diabetes mellitus with diabetic cataract: Secondary | ICD-10-CM | POA: Diagnosis not present

## 2019-05-03 DIAGNOSIS — Z885 Allergy status to narcotic agent status: Secondary | ICD-10-CM | POA: Insufficient documentation

## 2019-05-03 DIAGNOSIS — I2510 Atherosclerotic heart disease of native coronary artery without angina pectoris: Secondary | ICD-10-CM | POA: Diagnosis not present

## 2019-05-03 DIAGNOSIS — Z955 Presence of coronary angioplasty implant and graft: Secondary | ICD-10-CM | POA: Diagnosis not present

## 2019-05-03 DIAGNOSIS — I110 Hypertensive heart disease with heart failure: Secondary | ICD-10-CM | POA: Diagnosis not present

## 2019-05-03 DIAGNOSIS — H25811 Combined forms of age-related cataract, right eye: Secondary | ICD-10-CM | POA: Diagnosis not present

## 2019-05-03 DIAGNOSIS — E7800 Pure hypercholesterolemia, unspecified: Secondary | ICD-10-CM | POA: Insufficient documentation

## 2019-05-03 DIAGNOSIS — Z9049 Acquired absence of other specified parts of digestive tract: Secondary | ICD-10-CM | POA: Diagnosis not present

## 2019-05-03 DIAGNOSIS — I509 Heart failure, unspecified: Secondary | ICD-10-CM | POA: Diagnosis not present

## 2019-05-03 HISTORY — DX: Acute on chronic systolic (congestive) heart failure: I50.23

## 2019-05-03 HISTORY — PX: CATARACT EXTRACTION W/PHACO: SHX586

## 2019-05-03 HISTORY — DX: Other specified soft tissue disorders: M79.89

## 2019-05-03 LAB — GLUCOSE, CAPILLARY
Glucose-Capillary: 212 mg/dL — ABNORMAL HIGH (ref 70–99)
Glucose-Capillary: 216 mg/dL — ABNORMAL HIGH (ref 70–99)

## 2019-05-03 SURGERY — PHACOEMULSIFICATION, CATARACT, WITH IOL INSERTION
Anesthesia: Monitor Anesthesia Care | Site: Eye | Laterality: Right

## 2019-05-03 MED ORDER — EPINEPHRINE PF 1 MG/ML IJ SOLN
INTRAOCULAR | Status: DC | PRN
  Administered 2019-05-03: 51 mL via OPHTHALMIC

## 2019-05-03 MED ORDER — MOXIFLOXACIN HCL 0.5 % OP SOLN
1.0000 [drp] | OPHTHALMIC | Status: DC | PRN
  Administered 2019-05-03 (×3): 1 [drp] via OPHTHALMIC

## 2019-05-03 MED ORDER — LIDOCAINE HCL (PF) 2 % IJ SOLN
INTRAOCULAR | Status: DC | PRN
  Administered 2019-05-03: 2 mL

## 2019-05-03 MED ORDER — ACETAMINOPHEN 160 MG/5ML PO SOLN: 325.0000 mg | ORAL | Status: DC | PRN

## 2019-05-03 MED ORDER — ERYTHROMYCIN 5 MG/GM OP OINT
TOPICAL_OINTMENT | OPHTHALMIC | Status: DC | PRN
  Administered 2019-05-03: 1 via OPHTHALMIC

## 2019-05-03 MED ORDER — BRIMONIDINE TARTRATE-TIMOLOL 0.2-0.5 % OP SOLN
OPHTHALMIC | Status: DC | PRN
  Administered 2019-05-03: 1 [drp] via OPHTHALMIC

## 2019-05-03 MED ORDER — MIDAZOLAM HCL 2 MG/2ML IJ SOLN
INTRAMUSCULAR | Status: DC | PRN
  Administered 2019-05-03: 2 mg via INTRAVENOUS

## 2019-05-03 MED ORDER — FENTANYL CITRATE (PF) 100 MCG/2ML IJ SOLN
INTRAMUSCULAR | Status: DC | PRN
  Administered 2019-05-03: 50 ug via INTRAVENOUS

## 2019-05-03 MED ORDER — CEFUROXIME OPHTHALMIC INJECTION 1 MG/0.1 ML
INJECTION | OPHTHALMIC | Status: DC | PRN
  Administered 2019-05-03: 0.1 mL via INTRACAMERAL

## 2019-05-03 MED ORDER — ARMC OPHTHALMIC DILATING DROPS
1.0000 "application " | OPHTHALMIC | Status: DC | PRN
  Administered 2019-05-03 (×3): 1 via OPHTHALMIC

## 2019-05-03 MED ORDER — TETRACAINE HCL 0.5 % OP SOLN
1.0000 [drp] | OPHTHALMIC | Status: DC | PRN
  Administered 2019-05-03 (×3): 1 [drp] via OPHTHALMIC

## 2019-05-03 MED ORDER — NA HYALUR & NA CHOND-NA HYALUR 0.4-0.35 ML IO KIT
PACK | INTRAOCULAR | Status: DC | PRN
  Administered 2019-05-03: 1 mL via INTRAOCULAR

## 2019-05-03 MED ORDER — ACETAMINOPHEN 325 MG PO TABS: 325.0000 mg | ORAL_TABLET | ORAL | Status: DC | PRN

## 2019-05-03 SURGICAL SUPPLY — 23 items
CANNULA ANT/CHMB 27G (MISCELLANEOUS) ×1 IMPLANT
CANNULA ANT/CHMB 27GA (MISCELLANEOUS) ×3 IMPLANT
GLOVE SURG LX 7.5 STRW (GLOVE) ×2
GLOVE SURG LX STRL 7.5 STRW (GLOVE) ×1 IMPLANT
GLOVE SURG TRIUMPH 8.0 PF LTX (GLOVE) ×3 IMPLANT
GOWN STRL REUS W/ TWL LRG LVL3 (GOWN DISPOSABLE) ×2 IMPLANT
GOWN STRL REUS W/TWL LRG LVL3 (GOWN DISPOSABLE) ×6
LENS IOL DIOP 20.5 (Intraocular Lens) ×3 IMPLANT
LENS IOL TECNIS MONO 20.5 (Intraocular Lens) IMPLANT
MARKER SKIN DUAL TIP RULER LAB (MISCELLANEOUS) ×3 IMPLANT
NDL CAPSULORHEX 25GA (NEEDLE) ×1 IMPLANT
NDL FILTER BLUNT 18X1 1/2 (NEEDLE) ×2 IMPLANT
NEEDLE CAPSULORHEX 25GA (NEEDLE) ×3 IMPLANT
NEEDLE FILTER BLUNT 18X 1/2SAF (NEEDLE) ×4
NEEDLE FILTER BLUNT 18X1 1/2 (NEEDLE) ×2 IMPLANT
PACK CATARACT BRASINGTON (MISCELLANEOUS) ×3 IMPLANT
PACK EYE AFTER SURG (MISCELLANEOUS) ×3 IMPLANT
PACK OPTHALMIC (MISCELLANEOUS) ×3 IMPLANT
SOLUTION OPHTHALMIC SALT (MISCELLANEOUS) ×3 IMPLANT
SYR 3ML LL SCALE MARK (SYRINGE) ×6 IMPLANT
SYR TB 1ML LUER SLIP (SYRINGE) ×3 IMPLANT
WATER STERILE IRR 250ML POUR (IV SOLUTION) ×3 IMPLANT
WIPE NON LINTING 3.25X3.25 (MISCELLANEOUS) ×3 IMPLANT

## 2019-05-03 NOTE — Op Note (Signed)
LOCATION:  [facility]   PREOPERATIVE DIAGNOSIS:    Nuclear sclerotic cataract right eye. H25.11   POSTOPERATIVE DIAGNOSIS:  Nuclear sclerotic cataract right eye.     PROCEDURE:  Phacoemusification with posterior chamber intraocular lens placement of the right eye   ULTRASOUND TIME: Procedure(s) with comments: CATARACT EXTRACTION PHACO AND INTRAOCULAR LENS PLACEMENT (IOC) RIGHT DIABETIC 6.44 00:58.4 11.0% (Right) - DIABETIC  LENS:   Implant Name Type Inv. Item Serial No. Manufacturer Lot No. LRB No. Used Action  LENS IOL DIOP 20.5 - [id number] Intraocular Lens LENS IOL DIOP 20.5 [id number]  Right 1 Implanted         SURGEON:  Wyonia Hough, MD   ANESTHESIA:  Topical with tetracaine drops and 2% Xylocaine jelly, augmented with 1% preservative-free intracameral lidocaine.    COMPLICATIONS:  None.   DESCRIPTION OF PROCEDURE:  The patient was identified in the holding room and transported to the operating room and placed in the supine position under the operating microscope.  The right eye was identified as the operative eye and it was prepped and draped in the usual sterile ophthalmic fashion.   A 1 millimeter clear-corneal paracentesis was made at the 12:00 position.  0.5 ml of preservative-free 1% lidocaine was injected into the anterior chamber. The anterior chamber was filled with Viscoat viscoelastic.  A 2.4 millimeter keratome was used to make a near-clear corneal incision at the 9:00 position.  A curvilinear capsulorrhexis was made with a cystotome and capsulorrhexis forceps.  Balanced salt solution was used to hydrodissect and hydrodelineate the nucleus.   Phacoemulsification was then used in stop and chop fashion to remove the lens nucleus and epinucleus.  The remaining cortex was then removed using the irrigation and aspiration handpiece. Provisc was then placed into the capsular bag to distend it for lens placement.  A lens was then injected into the  capsular bag.  The remaining viscoelastic was aspirated.   Wounds were hydrated with balanced salt solution.  The anterior chamber was inflated to a physiologic pressure with balanced salt solution.  No wound leaks were noted. Cefuroxime 0.1 ml of a 10mg /ml solution was injected into the anterior chamber for a dose of 1 mg of intracameral antibiotic at the completion of the case.   Timolol and Brimonidine drops and Erythromycin ointment were applied to the eye.  The patient was taken to the recovery room in stable condition without complications of anesthesia or surgery.   Shawntelle Ungar 05/03/2019, 8:01 AM

## 2019-05-03 NOTE — Anesthesia Procedure Notes (Signed)
Procedure Name: MAC Performed by: Damara Klunder, CRNA Pre-anesthesia Checklist: Patient identified, Emergency Drugs available, Suction available, Timeout performed and Patient being monitored Patient Re-evaluated:Patient Re-evaluated prior to induction Oxygen Delivery Method: Nasal cannula Placement Confirmation: positive ETCO2       

## 2019-05-03 NOTE — H&P (Signed)

## 2019-05-03 NOTE — Anesthesia Preprocedure Evaluation (Addendum)
Anesthesia Evaluation  Patient identified by MRN, date of birth, ID band Patient awake    Reviewed: Allergy & Precautions, NPO status , Patient's Chart, lab work & pertinent test results  Airway Mallampati: II  TM Distance: >3 FB Neck ROM: Limited    Dental   Pulmonary    Pulmonary exam normal        Cardiovascular hypertension, + CAD and +CHF  Normal cardiovascular exam     Neuro/Psych    GI/Hepatic   Endo/Other  diabetes  Renal/GU      Musculoskeletal  (+) Arthritis ,   Abdominal   Peds  Hematology   Anesthesia Other Findings   Reproductive/Obstetrics                            Anesthesia Physical Anesthesia Plan  ASA: III  Anesthesia Plan: MAC   Post-op Pain Management:    Induction: Intravenous  PONV Risk Score and Plan:   Airway Management Planned: Natural Airway  Additional Equipment:   Intra-op Plan:   Post-operative Plan:   Informed Consent: I have reviewed the patients History and Physical, chart, labs and discussed the procedure including the risks, benefits and alternatives for the proposed anesthesia with the patient or authorized representative who has indicated his/her understanding and acceptance.       Plan Discussed with: CRNA, Anesthesiologist and Surgeon  Anesthesia Plan Comments:         Anesthesia Quick Evaluation

## 2019-05-03 NOTE — Anesthesia Postprocedure Evaluation (Signed)
Anesthesia Post Note  Patient: [name]  Procedure(s) Performed: CATARACT EXTRACTION PHACO AND INTRAOCULAR LENS PLACEMENT (IOC) RIGHT DIABETIC 6.44 00:58.4 11.0% (Right Eye)     Patient location during evaluation: PACU Anesthesia Type: MAC Level of consciousness: awake and alert Pain management: pain level controlled Vital Signs Assessment: post-procedure vital signs reviewed and stable Respiratory status: spontaneous breathing, nonlabored ventilation, respiratory function stable and patient connected to nasal cannula oxygen Cardiovascular status: stable and blood pressure returned to baseline Postop Assessment: no apparent nausea or vomiting Anesthetic complications: no    Wanda Plump [place]

## 2019-05-03 NOTE — Transfer of Care (Signed)
Immediate Anesthesia Transfer of Care Note  Patient: [name]  Procedure(s) Performed: CATARACT EXTRACTION PHACO AND INTRAOCULAR LENS PLACEMENT (IOC) RIGHT DIABETIC 6.44 00:58.4 11.0% (Right Eye)  Patient Location: PACU  Anesthesia Type: MAC  Level of Consciousness: awake, alert  and patient cooperative  Airway and Oxygen Therapy: Patient Spontanous Breathing and Patient connected to supplemental oxygen  Post-op Assessment: Post-op Vital signs reviewed, Patient's Cardiovascular Status Stable, Respiratory Function Stable, Patent Airway and No signs of Nausea or vomiting  Post-op Vital Signs: Reviewed and stable  Complications: No apparent anesthesia complications

## 2019-05-04 ENCOUNTER — Encounter: Payer: Self-pay | Admitting: *Deleted

## 2019-05-10 DIAGNOSIS — I10 Essential (primary) hypertension: Secondary | ICD-10-CM | POA: Diagnosis not present

## 2019-05-10 DIAGNOSIS — I509 Heart failure, unspecified: Secondary | ICD-10-CM | POA: Diagnosis not present

## 2019-05-10 DIAGNOSIS — E782 Mixed hyperlipidemia: Secondary | ICD-10-CM | POA: Diagnosis not present

## 2019-05-10 DIAGNOSIS — I5022 Chronic systolic (congestive) heart failure: Secondary | ICD-10-CM | POA: Diagnosis not present

## 2019-05-10 DIAGNOSIS — I25118 Atherosclerotic heart disease of native coronary artery with other forms of angina pectoris: Secondary | ICD-10-CM | POA: Diagnosis not present

## 2019-05-15 DIAGNOSIS — I5022 Chronic systolic (congestive) heart failure: Secondary | ICD-10-CM | POA: Diagnosis not present

## 2019-05-15 DIAGNOSIS — I110 Hypertensive heart disease with heart failure: Secondary | ICD-10-CM | POA: Diagnosis not present

## 2019-05-15 DIAGNOSIS — Z794 Long term (current) use of insulin: Secondary | ICD-10-CM | POA: Diagnosis not present

## 2019-05-15 DIAGNOSIS — E1142 Type 2 diabetes mellitus with diabetic polyneuropathy: Secondary | ICD-10-CM | POA: Diagnosis not present

## 2019-05-15 DIAGNOSIS — E782 Mixed hyperlipidemia: Secondary | ICD-10-CM | POA: Diagnosis not present

## 2019-05-16 ENCOUNTER — Other Ambulatory Visit: Payer: Self-pay

## 2019-05-16 ENCOUNTER — Encounter: Payer: Self-pay | Admitting: Emergency Medicine

## 2019-05-16 ENCOUNTER — Emergency Department
Admission: EM | Admit: 2019-05-16 | Discharge: 2019-05-16 | Disposition: A | Discharge status: Home / Self Care | Payer: Medicare HMO | Attending: Emergency Medicine | Admitting: Emergency Medicine

## 2019-05-16 DIAGNOSIS — Z794 Long term (current) use of insulin: Secondary | ICD-10-CM | POA: Diagnosis not present

## 2019-05-16 DIAGNOSIS — Z7982 Long term (current) use of aspirin: Secondary | ICD-10-CM | POA: Diagnosis not present

## 2019-05-16 DIAGNOSIS — E119 Type 2 diabetes mellitus without complications: Secondary | ICD-10-CM | POA: Diagnosis not present

## 2019-05-16 DIAGNOSIS — R0602 Shortness of breath: Secondary | ICD-10-CM | POA: Diagnosis not present

## 2019-05-16 DIAGNOSIS — D649 Anemia, unspecified: Secondary | ICD-10-CM | POA: Insufficient documentation

## 2019-05-16 DIAGNOSIS — Z79899 Other long term (current) drug therapy: Secondary | ICD-10-CM | POA: Diagnosis not present

## 2019-05-16 DIAGNOSIS — R531 Weakness: Secondary | ICD-10-CM | POA: Diagnosis not present

## 2019-05-16 DIAGNOSIS — R799 Abnormal finding of blood chemistry, unspecified: Secondary | ICD-10-CM | POA: Diagnosis present

## 2019-05-16 DIAGNOSIS — I5022 Chronic systolic (congestive) heart failure: Secondary | ICD-10-CM | POA: Diagnosis not present

## 2019-05-16 DIAGNOSIS — I509 Heart failure, unspecified: Secondary | ICD-10-CM | POA: Insufficient documentation

## 2019-05-16 DIAGNOSIS — E782 Mixed hyperlipidemia: Secondary | ICD-10-CM | POA: Diagnosis not present

## 2019-05-16 DIAGNOSIS — E1142 Type 2 diabetes mellitus with diabetic polyneuropathy: Secondary | ICD-10-CM | POA: Diagnosis not present

## 2019-05-16 DIAGNOSIS — I10 Essential (primary) hypertension: Secondary | ICD-10-CM | POA: Diagnosis not present

## 2019-05-16 DIAGNOSIS — I110 Hypertensive heart disease with heart failure: Secondary | ICD-10-CM | POA: Insufficient documentation

## 2019-05-16 LAB — COMPREHENSIVE METABOLIC PANEL
ALT: 17 U/L (ref 0–44)
AST: 14 U/L — ABNORMAL LOW (ref 15–41)
Albumin: 3.6 g/dL (ref 3.5–5.0)
Alkaline Phosphatase: 80 U/L (ref 38–126)
Anion gap: 10 (ref 5–15)
BUN: 26 mg/dL — ABNORMAL HIGH (ref 8–23)
CO2: 24 mmol/L (ref 22–32)
Calcium: 8.8 mg/dL — ABNORMAL LOW (ref 8.9–10.3)
Chloride: 103 mmol/L (ref 98–111)
Creatinine, Ser: 1.14 mg/dL (ref 0.61–1.24)
GFR calc Af Amer: >60 mL/min (ref >60)
GFR calc non Af Amer: >60 mL/min (ref >60)
Glucose, Bld: 269 mg/dL — ABNORMAL HIGH (ref 70–99)
Potassium: 4.7 mmol/L (ref 3.5–5.1)
Sodium: 137 mmol/L (ref 135–145)
Total Bilirubin: 1.2 mg/dL (ref 0.3–1.2)
Total Protein: 6.5 g/dL (ref 6.5–8.1)

## 2019-05-16 LAB — CBC
HCT: 23.7 % — ABNORMAL LOW (ref 39.0–52.0)
Hemoglobin: 6.6 g/dL — ABNORMAL LOW (ref 13.0–17.0)
MCH: 22 pg — ABNORMAL LOW (ref 26.0–34.0)
MCHC: 27.8 g/dL — ABNORMAL LOW (ref 30.0–36.0)
MCV: 79 fL — ABNORMAL LOW (ref 80.0–100.0)
Platelets: 249 10*3/uL (ref 150–400)
RBC: 3 MIL/uL — ABNORMAL LOW (ref 4.22–5.81)
RDW: 16.7 % — ABNORMAL HIGH (ref 11.5–15.5)
WBC: 6.5 10*3/uL (ref 4.0–10.5)
nRBC: 0 % (ref 0.0–0.2)

## 2019-05-16 LAB — PROTIME-INR
INR: 1 (ref 0.8–1.2)
Prothrombin Time: 13.4 seconds (ref 11.4–15.2)

## 2019-05-16 LAB — PREPARE RBC (CROSSMATCH)

## 2019-05-16 LAB — ABO/RH: ABO/RH(D): A POS

## 2019-05-16 MED ORDER — FERROUS SULFATE 325 (65 FE) MG PO TBEC: 325.0000 mg | DELAYED_RELEASE_TABLET | Freq: Two times a day (BID) | ORAL | 3 refills | Status: DC

## 2019-05-16 MED ORDER — SODIUM CHLORIDE 0.9 % IV SOLN
10.0000 mL/h | Freq: Once | INTRAVENOUS | Status: AC
  Administered 2019-05-16: 10 mL/h via INTRAVENOUS

## 2019-05-16 MED ORDER — VITAMIN C 250 MG PO TABS: 250.0000 mg | ORAL_TABLET | Freq: Every day | ORAL | 1 refills | Status: AC

## 2019-05-16 NOTE — ED Notes (Signed)
Care discussed with Dr. Joan Mayans, per Dr. Joan Mayans, repeat lab work.

## 2019-05-16 NOTE — ED Triage Notes (Signed)
Pt states was seen by his PCP and had blood drawn earlier today. Pt states was called back by PCP for Hgb 6.4, Hct 22. Pt states has had ongoing dizziness and weakness. Labs available for review in Care Everywhere.

## 2019-05-16 NOTE — Discharge Instructions (Signed)
Please schedule follow-up with your primary care doctor for recheck of your blood counts within the next week.  Please also schedule an appointment to see Dr. Grayland Ormond, a hematologist, for further evaluation of the cause of your low blood counts.  You were prescribed iron and vitamin C supplements to help improve your red blood counts.  If you develop worsening lightheadedness, fatigue, chest pain, shortness of breath, or any bleeding, please return to the ER for reevaluation.

## 2019-05-16 NOTE — ED Notes (Signed)
EDP [place] at bedside at this time 

## 2019-05-16 NOTE — ED Notes (Signed)
Pt provided with water by this RN.,

## 2019-05-16 NOTE — ED Notes (Signed)
Pt provided with phone to call son [name]).

## 2019-05-16 NOTE — ED Provider Notes (Addendum)
[facility] Emergency Department Provider Note   ____________________________________________   First MD Initiated Contact with Patient 05/16/19 1538     (approximate)  I have reviewed the triage vital signs and the nursing notes.   HISTORY  Chief Complaint Abnormal Lab    HPI [name] is a 69 y.o. male with possible history of CAD, CHF, hypertension, hyperlipidemia, and diabetes presents to the ED complaining of abnormal labs.  Patient reports that he has been feeling generally weak and fatigued with occasional lightheadedness for the past couple of weeks.  He saw his cardiologist and PCP for this problem, was initially diagnosed with pneumonia and started on Levaquin, but received a call today that his hemoglobin was 6.4.  He states he has not noticed any bleeding or dark tarry stools.  He denies any history of anemia and has not had any blood transfusion in the past.  He has been feeling otherwise well with no fevers, cough, chest pain, or shortness of breath.        Past Medical History:  Diagnosis Date  . Arthritis   . CHF (congestive heart failure), NYHA class II, acute on chronic, systolic (HCC)    [id number]  . Coronary artery disease    [facility]  . Diabetes mellitus without complication ([place])   . Hyperlipidemia   . Hypertension   . Numbness in right leg    due to back  . Skin cancer    removed l arm  . Spinal stenosis   . Stented coronary artery 2000   X 1 STENT  . Swelling of both lower extremities     Patient Active Problem List   Diagnosis Date Noted  . Lumbar spinal stenosis 04/13/2012    Past Surgical History:  Procedure Laterality Date  . CATARACT EXTRACTION W/PHACO Right 05/03/2019   Procedure: CATARACT EXTRACTION PHACO AND INTRAOCULAR LENS PLACEMENT (IOC) RIGHT DIABETIC 6.44 00:58.4 11.0%;  Surgeon: Leandrew Koyanagi, MD;  Location: [place];  Service: Ophthalmology;  Laterality:  Right;  DIABETIC  . CHOLECYSTECTOMY    . JOINT REPLACEMENT     RT KNEE  . LUMBAR LAMINECTOMY/DECOMPRESSION MICRODISCECTOMY  04/13/2012   Procedure: LUMBAR LAMINECTOMY/DECOMPRESSION MICRODISCECTOMY 1 LEVEL;  Surgeon: Johnn Hai, MD;  Location: [facility];  Service: Orthopedics;  Laterality: Bilateral;  L4-L5  . UVULECTOMY  1990's    Prior to Admission medications   Medication Sig Start Date End Date Taking? Authorizing Provider  aspirin 325 MG tablet Take 1 tablet (325 mg total) by mouth daily. 04/17/12   Cecilie Kicks, PA-C  atorvastatin (LIPITOR) 40 MG tablet Take 80 mg by mouth every evening.     [provider]  ciprofloxacin (CIPRO) 750 MG tablet Take 1 tablet (750 mg total) by mouth 2 (two) times daily. Patient not taking: Reported on 05/03/2019 10/27/17   Carrie Mew, MD  clindamycin (CLEOCIN) 150 MG capsule Take 3 capsules (450 mg total) by mouth 3 (three) times daily. Patient not taking: Reported on 05/03/2019 10/27/17   Carrie Mew, MD  ferrous sulfate 325 (65 FE) MG EC tablet Take 1 tablet (325 mg total) by mouth 2 (two) times daily. 05/16/19 05/15/20  Blake Divine, MD  furosemide (LASIX) 20 MG tablet Take 20 mg by mouth.    [provider]  glipiZIDE (GLUCOTROL XL) 5 MG 24 hr tablet Take 5 mg by mouth 2 (two) times daily.    [provider]  insulin aspart (NOVOLOG) 100 UNIT/ML  injection Inject into the skin 3 (three) times daily before meals.    [provider]  insulin glargine (LANTUS) 100 UNIT/ML injection Inject 44 Units into the skin daily.    [provider]  losartan (COZAAR) 50 MG tablet Take 100 mg by mouth every evening.     [provider]  metFORMIN (GLUCOPHAGE) 1000 MG tablet Take 1,000 mg by mouth 2 (two) times daily with a meal.    [provider]  methocarbamol (ROBAXIN) 500 MG tablet Take 1 tablet (500 mg total) by mouth 3 (three) times daily between meals as needed. Patient not taking:  Reported on 04/26/2019 04/13/12   Susa Day, MD  naproxen sodium (ANAPROX) 220 MG tablet Take 220 mg by mouth 2 (two) times daily with a meal.    [provider]  oxyCODONE-acetaminophen (PERCOCET) 5-325 MG per tablet Take 1-2 tablets by mouth every 4 (four) hours as needed for pain. Patient not taking: Reported on 04/26/2019 04/13/12   Susa Day, MD  vitamin C (ASCORBIC ACID) 250 MG tablet Take 1 tablet (250 mg total) by mouth daily. 05/16/19 07/15/19  Blake Divine, MD    Allergies Metoprolol, Toprol xl [metoprolol tartrate], Buprenorphine hcl, Morphine, Morphine and related, and Triamcinolone acetonide  History reviewed. No pertinent family history.  Social History Social History   Tobacco Use  . Smoking status: Never Smoker  . Smokeless tobacco: Never Used  Substance Use Topics  . Alcohol use: Yes    Comment: rare  . Drug use: No    Review of Systems  Constitutional: No fever/chills.  Positive for generalized weakness and fatigue. Eyes: No visual changes. ENT: No sore throat. Cardiovascular: Denies chest pain. Respiratory: Denies shortness of breath. Gastrointestinal: No abdominal pain.  No nausea, no vomiting.  No diarrhea.  No constipation. Genitourinary: Negative for dysuria. Musculoskeletal: Negative for back pain. Skin: Negative for rash. Neurological: Negative for headaches, focal weakness or numbness.  ____________________________________________   PHYSICAL EXAM:  VITAL SIGNS: ED Triage Vitals [05/16/19 1330]  Enc Vitals Group     BP (!) 126/56     Pulse Rate 88     Resp 18     Temp 97.7 F (36.5 C)     Temp Source Oral     SpO2 100 %     Weight 288 lb (130.6 kg)     Height 6' (1.829 m)     Head Circumference      Peak Flow      Pain Score 0     Pain Loc      Pain Edu?      Excl. in [place]?     Constitutional: Alert and oriented. Eyes: Conjunctivae are normal. Head: Atraumatic. Nose: No congestion/rhinnorhea. Mouth/Throat: Mucous  membranes are moist. Neck: Normal ROM Cardiovascular: Normal rate, regular rhythm. Grossly normal heart sounds. Respiratory: Normal respiratory effort.  No retractions. Lungs CTAB. Gastrointestinal: Soft and nontender. No distention.  Rectal exam with light brown guaiac negative stool. Genitourinary: deferred Musculoskeletal: No lower extremity tenderness nor edema. Neurologic:  Normal speech and language. No gross focal neurologic deficits are appreciated. Skin:  Skin is warm, dry and intact. No rash noted. Psychiatric: Mood and affect are normal. Speech and behavior are normal.  ____________________________________________   LABS (all labs ordered are listed, but only abnormal results are displayed)  Labs Reviewed  COMPREHENSIVE METABOLIC PANEL - Abnormal; Notable for the following components:      Result Value   Glucose, Bld 269 (*)  BUN 26 (*)    Calcium 8.8 (*)    AST 14 (*)    All other components within normal limits  CBC - Abnormal; Notable for the following components:   RBC 3.00 (*)    Hemoglobin 6.6 (*)    HCT 23.7 (*)    MCV 79.0 (*)    MCH 22.0 (*)    MCHC 27.8 (*)    RDW 16.7 (*)    All other components within normal limits  PROTIME-INR  TYPE AND SCREEN  PREPARE RBC (CROSSMATCH)  ABO/RH    PROCEDURES  Procedure(s) performed (including Critical Care):  .Critical Care Performed by: Blake Divine, MD Authorized by: Blake Divine, MD   Critical care provider statement:    Critical care time (minutes):  45   Critical care time was exclusive of:  Separately billable procedures and treating other patients and teaching time   Critical care was necessary to treat or prevent imminent or life-threatening deterioration of the following conditions:  Circulatory failure   Critical care was time spent personally by me on the following activities:  Discussions with consultants, evaluation of patient's response to treatment, examination of patient, ordering and  performing treatments and interventions, ordering and review of laboratory studies, ordering and review of radiographic studies, pulse oximetry, re-evaluation of patient's condition, obtaining history from patient or surrogate and review of old charts   I assumed direction of critical care for this patient from another provider in my specialty: no     ED ECG REPORT I, Blake Divine, the attending physician, personally viewed and interpreted this ECG.   Date: 07/04/2019  EKG Time: 13:36  Rate: 90  Rhythm: normal sinus rhythm  Axis: Normal  Intervals:right bundle branch block  ST&T Change: None   ____________________________________________   INITIAL IMPRESSION / ASSESSMENT AND PLAN / ED COURSE       69 year old male presents to the ED for generalized weakness and fatigue, was told by his PCP that his lab work came back with significant anemia.  Labs here in the ED redemonstrate a microcytic anemia with hemoglobin of 6.4.  There is not appear to be any acute bleeding and rectal exam shows guaiac negative stool.  We will plan to transfuse 1 unit PRBCs and start patient on iron supplementation, refer to hematology for further evaluation.  Patient tolerated transfusion without difficulty.  I reviewed return precautions with him in detail and plan for referral to hematology as well as PCP for recheck of his CBC.  Patient agrees to return to the ED for any new or worsening symptoms.      ____________________________________________   FINAL CLINICAL IMPRESSION(S) / ED DIAGNOSES  Final diagnoses:  Symptomatic anemia  Generalized weakness     ED Discharge Orders         Ordered    ferrous sulfate 325 (65 FE) MG EC tablet  2 times daily     05/16/19 1915    vitamin C (ASCORBIC ACID) 250 MG tablet  Daily     05/16/19 1915           Note:  This document was prepared using Dragon voice recognition software and may include unintentional dictation errors.   Blake Divine,  MD 05/16/19 [id number]    Blake Divine, MD 07/04/19 [phone]

## 2019-05-16 NOTE — ED Notes (Signed)
EDP [place] at bedside 

## 2019-05-17 LAB — BPAM RBC
Blood Product Expiration Date: 202103062359
ISSUE DATE / TIME: 202103021713
Unit Type and Rh: 5100

## 2019-05-17 LAB — TYPE AND SCREEN
ABO/RH(D): A POS
Antibody Screen: NEGATIVE
Unit division: 0

## 2019-05-21 DIAGNOSIS — D649 Anemia, unspecified: Secondary | ICD-10-CM | POA: Insufficient documentation

## 2019-05-21 NOTE — Progress Notes (Signed)
[name]  [phone] [phone]) [phone]  ID: [name] OB: 1950-08-21  MR#: [id number]  CSN#:[id number]  Patient Care Team: Juluis Pitch, MD as PCP - General (Family Medicine)  CHIEF COMPLAINT: Anemia, unspecified.  INTERVAL HISTORY: Patient is a 69 year old male who was recently evaluated in the emergency room after being found to have a hemoglobin of 6.6.  He received 1 unit of packed red blood cells and was referred to clinic for further evaluation.  He currently feels well and is asymptomatic.  He does not complain of weakness or fatigue.  He has no neurologic complaints.  He denies any recent fevers or illnesses.  He has a good appetite and denies weight loss.  He has no chest pain, shortness of breath, cough, or hemoptysis.,  Vomiting, constipation, or diarrhea.  He has no melena or hematochezia.  He has no urinary complaints.  Patient feels at his baseline offers no specific complaints today.  REVIEW OF SYSTEMS:   Review of Systems  Constitutional: Negative.  Negative for fever, malaise/fatigue and weight loss.  Respiratory: Negative.  Negative for cough, hemoptysis and shortness of breath.   Cardiovascular: Negative.  Negative for chest pain and leg swelling.  Gastrointestinal: Negative.  Negative for abdominal pain, blood in stool and melena.  Genitourinary: Negative.  Negative for hematuria.  Musculoskeletal: Negative.  Negative for back pain.  Skin: Negative.  Negative for rash.  Neurological: Negative.  Negative for dizziness, focal weakness, weakness and headaches.  Psychiatric/Behavioral: Negative.  The patient is not nervous/anxious.     As per HPI. Otherwise, a complete review of systems is negative.  PAST MEDICAL HISTORY: Past Medical History:  Diagnosis Date  . Arthritis   . CHF (congestive heart failure), NYHA class II, acute on chronic, systolic (HCC)    [id number]  . [facility]    [facility]  . Diabetes mellitus without complication ([place])   . Hyperlipidemia   . Hypertension   . Numbness in right leg    due to back  . Skin cancer    removed l arm  . Spinal stenosis   . Stented coronary artery 2000   X 1 STENT  . Swelling of both lower extremities     PAST SURGICAL HISTORY: Past Surgical History:  Procedure Laterality Date  . CATARACT EXTRACTION W/PHACO Right 05/03/2019   Procedure: CATARACT EXTRACTION PHACO AND INTRAOCULAR LENS PLACEMENT (IOC) RIGHT DIABETIC 6.44 00:58.4 11.0%;  Surgeon: Leandrew Koyanagi, MD;  Location: [place];  Service: Ophthalmology;  Laterality: Right;  DIABETIC  . CHOLECYSTECTOMY    . JOINT REPLACEMENT     RT KNEE  . LUMBAR LAMINECTOMY/DECOMPRESSION MICRODISCECTOMY  04/13/2012   Procedure: LUMBAR LAMINECTOMY/DECOMPRESSION MICRODISCECTOMY 1 LEVEL;  Surgeon: Johnn Hai, MD;  Location[facility];  Service: Orthopedics;  Laterality: Bilateral;  L4-L5  . UVULECTOMY  1990's    FAMILY HISTORY: History reviewed. No pertinent family history.  ADVANCED DIRECTIVES (Y/N):  N  HEALTH MAINTENANCE: Social History   Tobacco Use  . Smoking status: Never Smoker  . Smokeless tobacco: Never Used  Substance Use Topics  . Alcohol use: Yes    Comment: rare  . Drug use: No     Colonoscopy:  PAP:  Bone density:  Lipid panel:  Allergies  Allergen Reactions  . Metoprolol Other (See Comments)    Cant remember  . Toprol Xl [Metoprolol Tartrate] Other (See Comments)    Cant remember  . Buprenorphine Hcl Itching and  Rash  . Morphine Itching and Other (See Comments)  . Morphine And Related Itching and Rash  . Triamcinolone Acetonide Rash    Current Outpatient Medications  Medication Sig Dispense Refill  . aspirin 325 MG tablet Take 1 tablet (325 mg total) by mouth daily.    Marland Kitchen atorvastatin (LIPITOR) 40 MG tablet Take 80 mg by mouth every evening.     . ferrous sulfate 325 (65 FE) MG EC tablet Take 1 tablet (325 mg total) by  mouth 2 (two) times daily. 60 tablet 3  . furosemide (LASIX) 20 MG tablet Take 20 mg by mouth.    Marland Kitchen glipiZIDE (GLUCOTROL XL) 5 MG 24 hr tablet Take 5 mg by mouth 2 (two) times daily.    . insulin aspart (NOVOLOG) 100 UNIT/ML injection Inject into the skin 3 (three) times daily before meals.    . insulin glargine (LANTUS) 100 UNIT/ML injection Inject 44 Units into the skin daily.    . isosorbide mononitrate (IMDUR) 30 MG 24 hr tablet Take by mouth.    . losartan (COZAAR) 50 MG tablet Take 100 mg by mouth every evening.     . metFORMIN (GLUCOPHAGE) 1000 MG tablet Take 1,000 mg by mouth 2 (two) times daily with a meal.    . methocarbamol (ROBAXIN) 500 MG tablet Take 1 tablet (500 mg total) by mouth 3 (three) times daily between meals as needed. 40 tablet 1  . naproxen sodium (ANAPROX) 220 MG tablet Take 220 mg by mouth 2 (two) times daily with a meal.    . oxyCODONE-acetaminophen (PERCOCET) 5-325 MG per tablet Take 1-2 tablets by mouth every 4 (four) hours as needed for pain. 60 tablet 0  . vitamin C (ASCORBIC ACID) 250 MG tablet Take 1 tablet (250 mg total) by mouth daily. 30 tablet 1   No current facility-administered medications for this visit.    OBJECTIVE: Vitals:   05/22/19 1120  BP: 131/60  Pulse: 83  Resp: 18  Temp: (!) 97.5 F (36.4 C)  SpO2: 100%     Body mass index is 40.21 kg/m.    ECOG FS:0 - Asymptomatic  General: Well-developed, well-nourished, no acute distress. Eyes: Pink conjunctiva, anicteric sclera. HEENT: Normocephalic, moist mucous membranes. Lungs: No audible wheezing or coughing. Heart: Regular rate and rhythm. Abdomen: Soft, nontender, no obvious distention. Musculoskeletal: No edema, cyanosis, or clubbing. Neuro: Alert, answering all questions appropriately. Cranial nerves grossly intact. Skin: No rashes or petechiae noted. Psych: Normal affect. Lymphatics: No cervical, calvicular, axillary or inguinal LAD.   LAB RESULTS:  Lab Results  Component  Value Date   NA 137 05/16/2019   K 4.7 05/16/2019   CL 103 05/16/2019   CO2 24 05/16/2019   GLUCOSE 269 (H) 05/16/2019   BUN 26 (H) 05/16/2019   CREATININE 1.14 05/16/2019   CALCIUM 8.8 (L) 05/16/2019   PROT 6.5 05/16/2019   ALBUMIN 3.6 05/16/2019   AST 14 (L) 05/16/2019   ALT 17 05/16/2019   ALKPHOS 80 05/16/2019   BILITOT 1.2 05/16/2019   GFRNONAA >60 05/16/2019   GFRAA >60 05/16/2019    Lab Results  Component Value Date   WBC 7.5 05/22/2019   NEUTROABS 4.8 10/27/2017   HGB 7.5 (L) 05/22/2019   HCT 27.6 (L) 05/22/2019   MCV 82.1 05/22/2019   PLT 248 05/22/2019   Lab Results  Component Value Date   IRON 31 (L) 05/22/2019   TIBC 375 05/22/2019   IRONPCTSAT 8 (L) 05/22/2019   Lab Results  Component  Value Date   FERRITIN 44 05/22/2019     STUDIES: No results found.  ASSESSMENT: Anemia, unspecified.  PLAN:   1. Anemia, unspecified: Patient's hemoglobin has improved to 7.5 after receiving 1 unit of packed red blood cells the emergency room approximately 1 week ago.  His iron stores remain significantly decreased.  His reticulocyte count is appropriately elevated.  There is no evidence of hemolysis.  B12 and folate are within normal limits.  Erythropoietin and SPEP are pending at time of dictation.  Given patient's age, he will require an EGD and a colonoscopy in the near future.  Return to clinic in 1 week for further evaluation, discussion of his laboratory work, and initiation of IV Feraheme.  I spent a total of 45 minutes reviewing chart data, face-to-face evaluation with the patient, counseling and coordination of care as detailed above.   Patient expressed understanding and was in agreement with this plan. He also understands that He can call clinic at any time with any questions, concerns, or complaints.   Cancer Staging No matching staging information was found for the patient.  Lloyd Huger, MD   05/23/2019 6:25 AM

## 2019-05-22 ENCOUNTER — Encounter: Payer: Self-pay | Admitting: Oncology

## 2019-05-22 ENCOUNTER — Inpatient Hospital Stay: Payer: Medicare HMO | Attending: Oncology | Admitting: Oncology

## 2019-05-22 ENCOUNTER — Other Ambulatory Visit: Payer: Self-pay

## 2019-05-22 ENCOUNTER — Inpatient Hospital Stay: Payer: Medicare HMO

## 2019-05-22 DIAGNOSIS — D649 Anemia, unspecified: Secondary | ICD-10-CM

## 2019-05-22 DIAGNOSIS — D509 Iron deficiency anemia, unspecified: Secondary | ICD-10-CM | POA: Diagnosis not present

## 2019-05-22 DIAGNOSIS — Z79899 Other long term (current) drug therapy: Secondary | ICD-10-CM | POA: Diagnosis not present

## 2019-05-22 LAB — RETICULOCYTES
Immature Retic Fract: 31.2 % — ABNORMAL HIGH (ref 2.3–15.9)
RBC.: 3.38 MIL/uL — ABNORMAL LOW (ref 4.22–5.81)
Retic Count, Absolute: 193.3 10*3/uL — ABNORMAL HIGH (ref 19.0–186.0)
Retic Ct Pct: 5.7 % — ABNORMAL HIGH (ref 0.4–3.1)

## 2019-05-22 LAB — IRON AND TIBC
Iron: 31 ug/dL — ABNORMAL LOW (ref 45–182)
Saturation Ratios: 8 % — ABNORMAL LOW (ref 17.9–39.5)
TIBC: 375 ug/dL (ref 250–450)
UIBC: 344 ug/dL

## 2019-05-22 LAB — CBC
HCT: 27.6 % — ABNORMAL LOW (ref 39.0–52.0)
Hemoglobin: 7.5 g/dL — ABNORMAL LOW (ref 13.0–17.0)
MCH: 22.3 pg — ABNORMAL LOW (ref 26.0–34.0)
MCHC: 27.2 g/dL — ABNORMAL LOW (ref 30.0–36.0)
MCV: 82.1 fL (ref 80.0–100.0)
Platelets: 248 10*3/uL (ref 150–400)
RBC: 3.36 MIL/uL — ABNORMAL LOW (ref 4.22–5.81)
RDW: 18.6 % — ABNORMAL HIGH (ref 11.5–15.5)
WBC: 7.5 10*3/uL (ref 4.0–10.5)
nRBC: 0 % (ref 0.0–0.2)

## 2019-05-22 LAB — FOLATE: Folate: 39 ng/mL (ref >5.9)

## 2019-05-22 LAB — LACTATE DEHYDROGENASE: LDH: 136 U/L (ref 98–192)

## 2019-05-22 LAB — VITAMIN B12: Vitamin B-12: 266 pg/mL (ref 180–914)

## 2019-05-22 LAB — DAT, POLYSPECIFIC AHG (ARMC ONLY): Polyspecific AHG test: NEGATIVE

## 2019-05-22 LAB — FERRITIN: Ferritin: 44 ng/mL (ref 24–336)

## 2019-05-22 NOTE — Progress Notes (Signed)
Patient is coming to [place] per the ER for anemia. He is doing well no no complaints

## 2019-05-23 DIAGNOSIS — D509 Iron deficiency anemia, unspecified: Secondary | ICD-10-CM | POA: Insufficient documentation

## 2019-05-23 LAB — PROTEIN ELECTROPHORESIS, SERUM
A/G Ratio: 1.3 (ref 0.7–1.7)
Albumin ELP: 3.3 g/dL (ref 2.9–4.4)
Alpha-1-Globulin: 0.2 g/dL (ref 0.0–0.4)
Alpha-2-Globulin: 0.6 g/dL (ref 0.4–1.0)
Beta Globulin: 0.9 g/dL (ref 0.7–1.3)
Gamma Globulin: 0.7 g/dL (ref 0.4–1.8)
Globulin, Total: 2.5 g/dL (ref 2.2–3.9)
Total Protein ELP: 5.8 g/dL — ABNORMAL LOW (ref 6.0–8.5)

## 2019-05-23 LAB — HAPTOGLOBIN: Haptoglobin: 196 mg/dL (ref 32–363)

## 2019-05-23 LAB — ERYTHROPOIETIN: Erythropoietin: 157.9 m[IU]/mL — ABNORMAL HIGH (ref 2.6–18.5)

## 2019-05-23 LAB — CEA: CEA: 1.7 ng/mL (ref 0.0–4.7)

## 2019-05-25 NOTE — Progress Notes (Signed)
[place]  Telephone:(336) [phone] Fax:(336) [phone]  ID: [name] OB: Aug 18, 1950  MR#: [id number]  CSN#:[id number]  Patient Care Team: Juluis Pitch, MD as PCP - General (Family Medicine)  CHIEF COMPLAINT: Iron deficiency anemia.  INTERVAL HISTORY: Patient returns to clinic today for further evaluation, discussion of his laboratory work, and initiation of IV iron.  He continues to feel well and remains asymptomatic.  He does not complain of weakness or fatigue today.  He has no neurologic complaints.  He denies any recent fevers or illnesses.  He has a good appetite and denies weight loss.  He has no chest pain, shortness of breath, cough, or hemoptysis.  He denies any nausea, vomiting, constipation, or diarrhea.  He has no melena or hematochezia.  He has no urinary complaints.  Patient offers no specific complaints today.  REVIEW OF SYSTEMS:   Review of Systems  Constitutional: Negative.  Negative for fever, malaise/fatigue and weight loss.  Respiratory: Negative.  Negative for cough, hemoptysis and shortness of breath.   Cardiovascular: Negative.  Negative for chest pain and leg swelling.  Gastrointestinal: Negative.  Negative for abdominal pain, blood in stool and melena.  Genitourinary: Negative.  Negative for hematuria.  Musculoskeletal: Negative.  Negative for back pain.  Skin: Negative.  Negative for rash.  Neurological: Negative.  Negative for dizziness, focal weakness, weakness and headaches.  Psychiatric/Behavioral: Negative.  The patient is not nervous/anxious.     As per HPI. Otherwise, a complete review of systems is negative.  PAST MEDICAL HISTORY: Past Medical History:  Diagnosis Date  . Arthritis   . CHF (congestive heart failure), NYHA class II, acute on chronic, systolic (HCC)    EF [id number]  . Coronary artery disease    [facility]  . Diabetes mellitus without complication ([place])   . Hyperlipidemia   .  Hypertension   . Numbness in right leg    due to back  . Skin cancer    removed l arm  . Spinal stenosis   . Stented coronary artery 2000   X 1 STENT  . Swelling of both lower extremities     PAST SURGICAL HISTORY: Past Surgical History:  Procedure Laterality Date  . CATARACT EXTRACTION W/PHACO Right 05/03/2019   Procedure: CATARACT EXTRACTION PHACO AND INTRAOCULAR LENS PLACEMENT (IOC) RIGHT DIABETIC 6.44 00:58.4 11.0%;  Surgeon: Leandrew Koyanagi, MD;  Location: [place];  Service: Ophthalmology;  Laterality: Right;  DIABETIC  . CHOLECYSTECTOMY    . JOINT REPLACEMENT     RT KNEE  . LUMBAR LAMINECTOMY/DECOMPRESSION MICRODISCECTOMY  04/13/2012   Procedure: LUMBAR LAMINECTOMY/DECOMPRESSION MICRODISCECTOMY 1 LEVEL;  Surgeon: Johnn Hai, MD;  Location: [facility];  Service: Orthopedics;  Laterality: Bilateral;  L4-L5  . UVULECTOMY  1990's    FAMILY HISTORY: No family history on file.  ADVANCED DIRECTIVES (Y/N):  N  HEALTH MAINTENANCE: Social History   Tobacco Use  . Smoking status: Never Smoker  . Smokeless tobacco: Never Used  Substance Use Topics  . Alcohol use: Yes    Comment: rare  . Drug use: No     Colonoscopy:  PAP:  Bone density:  Lipid panel:  Allergies  Allergen Reactions  . Metoprolol Other (See Comments)    Cant remember  . Toprol Xl [Metoprolol Tartrate] Other (See Comments)    Cant remember  . Buprenorphine Hcl Itching and Rash  . Morphine Itching and Other (See Comments)  . Morphine And Related Itching and Rash  . Triamcinolone  Acetonide Rash    Current Outpatient Medications  Medication Sig Dispense Refill  . aspirin 325 MG tablet Take 1 tablet (325 mg total) by mouth daily.    Marland Kitchen atorvastatin (LIPITOR) 40 MG tablet Take 80 mg by mouth every evening.     . ferrous sulfate 325 (65 FE) MG EC tablet Take 1 tablet (325 mg total) by mouth 2 (two) times daily. 60 tablet 3  . furosemide (LASIX) 20 MG tablet Take 20 mg by mouth.    Marland Kitchen  glipiZIDE (GLUCOTROL XL) 5 MG 24 hr tablet Take 5 mg by mouth 2 (two) times daily.    . insulin aspart (NOVOLOG) 100 UNIT/ML injection Inject into the skin 3 (three) times daily before meals.    . insulin glargine (LANTUS) 100 UNIT/ML injection Inject 44 Units into the skin daily.    . isosorbide mononitrate (IMDUR) 30 MG 24 hr tablet Take by mouth.    . losartan (COZAAR) 50 MG tablet Take 100 mg by mouth every evening.     . metFORMIN (GLUCOPHAGE) 1000 MG tablet Take 1,000 mg by mouth 2 (two) times daily with a meal.    . methocarbamol (ROBAXIN) 500 MG tablet Take 1 tablet (500 mg total) by mouth 3 (three) times daily between meals as needed. 40 tablet 1  . naproxen sodium (ANAPROX) 220 MG tablet Take 220 mg by mouth 2 (two) times daily with a meal.    . oxyCODONE-acetaminophen (PERCOCET) 5-325 MG per tablet Take 1-2 tablets by mouth every 4 (four) hours as needed for pain. 60 tablet 0  . vitamin C (ASCORBIC ACID) 250 MG tablet Take 1 tablet (250 mg total) by mouth daily. 30 tablet 1   No current facility-administered medications for this visit.    OBJECTIVE: Vitals:   05/29/19 1313  BP: (!) 132/54  Pulse: 77  Temp: (!) 97.2 F (36.2 C)     Body mass index is 39.91 kg/m.    ECOG FS:0 - Asymptomatic  General: Well-developed, well-nourished, no acute distress. Eyes: Pink conjunctiva, anicteric sclera. HEENT: Normocephalic, moist mucous membranes. Lungs: No audible wheezing or coughing. Heart: Regular rate and rhythm. Abdomen: Soft, nontender, no obvious distention. Musculoskeletal: No edema, cyanosis, or clubbing. Neuro: Alert, answering all questions appropriately. Cranial nerves grossly intact. Skin: No rashes or petechiae noted. Psych: Normal affect.   LAB RESULTS:  Lab Results  Component Value Date   NA 137 05/16/2019   K 4.7 05/16/2019   CL 103 05/16/2019   CO2 24 05/16/2019   GLUCOSE 269 (H) 05/16/2019   BUN 26 (H) 05/16/2019   CREATININE 1.14 05/16/2019   CALCIUM  8.8 (L) 05/16/2019   PROT 6.5 05/16/2019   ALBUMIN 3.6 05/16/2019   AST 14 (L) 05/16/2019   ALT 17 05/16/2019   ALKPHOS 80 05/16/2019   BILITOT 1.2 05/16/2019   GFRNONAA >60 05/16/2019   GFRAA >60 05/16/2019    Lab Results  Component Value Date   WBC 7.5 05/22/2019   NEUTROABS 4.8 10/27/2017   HGB 7.5 (L) 05/22/2019   HCT 27.6 (L) 05/22/2019   MCV 82.1 05/22/2019   PLT 248 05/22/2019   Lab Results  Component Value Date   IRON 31 (L) 05/22/2019   TIBC 375 05/22/2019   IRONPCTSAT 8 (L) 05/22/2019   Lab Results  Component Value Date   FERRITIN 44 05/22/2019     STUDIES: No results found.  ASSESSMENT: Iron deficiency anemia.  PLAN:   1.  Iron deficiency anemia: Patient's hemoglobin has improved  to 7.5 after receiving 1 unit of packed red blood cells the emergency room approximately 2 weeks.  His iron stores remain significantly decreased.  His reticulocyte count is appropriately elevated.  There is no evidence of hemolysis.  B12 and folate are within normal limits.  Erythropoietin level is appropriately elevated and SPEP is negative.  Proceed with 510 mg IV Feraheme today.  Return to clinic in 1 week for second infusion.  Patient will then return to clinic in 3 months with repeat laboratory work, further evaluation, and consideration of additional IV iron.  Patient was also given a referral to GI for consideration of EGD/colonoscopy.  I spent a total of 30 minutes reviewing chart data, face-to-face evaluation with the patient, counseling and coordination of care as detailed above.    Patient expressed understanding and was in agreement with this plan. He also understands that He can call clinic at any time with any questions, concerns, or complaints.    Andrew Huger, MD   05/29/2019 1:55 PM

## 2019-05-29 ENCOUNTER — Encounter: Payer: Self-pay | Admitting: Oncology

## 2019-05-29 ENCOUNTER — Inpatient Hospital Stay (HOSPITAL_BASED_OUTPATIENT_CLINIC_OR_DEPARTMENT_OTHER): Payer: Medicare HMO | Admitting: Oncology

## 2019-05-29 ENCOUNTER — Inpatient Hospital Stay: Payer: Medicare HMO

## 2019-05-29 VITALS — BP 132/67 | HR 74 | Resp 18

## 2019-05-29 VITALS — BP 132/54 | HR 77 | Temp 97.2°F | Wt 294.3 lb

## 2019-05-29 DIAGNOSIS — D509 Iron deficiency anemia, unspecified: Secondary | ICD-10-CM

## 2019-05-29 DIAGNOSIS — Z79899 Other long term (current) drug therapy: Secondary | ICD-10-CM | POA: Diagnosis not present

## 2019-05-29 DIAGNOSIS — D649 Anemia, unspecified: Secondary | ICD-10-CM | POA: Diagnosis not present

## 2019-05-29 MED ORDER — SODIUM CHLORIDE 0.9 % IV SOLN
510.0000 mg | Freq: Once | INTRAVENOUS | Status: AC
  Administered 2019-05-29: 510 mg via INTRAVENOUS
  Filled 2019-05-29: qty 510

## 2019-05-29 MED ORDER — SODIUM CHLORIDE 0.9 % IV SOLN
Freq: Once | INTRAVENOUS | Status: AC
  Filled 2019-05-29: qty 250

## 2019-06-01 DIAGNOSIS — I25118 Atherosclerotic heart disease of native coronary artery with other forms of angina pectoris: Secondary | ICD-10-CM | POA: Diagnosis not present

## 2019-06-01 DIAGNOSIS — I5022 Chronic systolic (congestive) heart failure: Secondary | ICD-10-CM | POA: Diagnosis not present

## 2019-06-05 ENCOUNTER — Other Ambulatory Visit: Payer: Self-pay

## 2019-06-05 ENCOUNTER — Inpatient Hospital Stay: Payer: Medicare HMO

## 2019-06-05 VITALS — BP 140/69 | HR 76 | Temp 97.3°F | Resp 18

## 2019-06-05 DIAGNOSIS — D509 Iron deficiency anemia, unspecified: Secondary | ICD-10-CM | POA: Diagnosis not present

## 2019-06-05 DIAGNOSIS — D649 Anemia, unspecified: Secondary | ICD-10-CM | POA: Diagnosis not present

## 2019-06-05 DIAGNOSIS — Z79899 Other long term (current) drug therapy: Secondary | ICD-10-CM | POA: Diagnosis not present

## 2019-06-05 MED ORDER — SODIUM CHLORIDE 0.9 % IV SOLN
Freq: Once | INTRAVENOUS | Status: AC
  Filled 2019-06-05: qty 250

## 2019-06-05 MED ORDER — SODIUM CHLORIDE 0.9 % IV SOLN
510.0000 mg | Freq: Once | INTRAVENOUS | Status: AC
  Administered 2019-06-05: 510 mg via INTRAVENOUS
  Filled 2019-06-05: qty 510

## 2019-06-06 DIAGNOSIS — E782 Mixed hyperlipidemia: Secondary | ICD-10-CM | POA: Diagnosis not present

## 2019-06-06 DIAGNOSIS — I5022 Chronic systolic (congestive) heart failure: Secondary | ICD-10-CM | POA: Diagnosis not present

## 2019-06-06 DIAGNOSIS — I2510 Atherosclerotic heart disease of native coronary artery without angina pectoris: Secondary | ICD-10-CM | POA: Diagnosis not present

## 2019-06-06 DIAGNOSIS — I6523 Occlusion and stenosis of bilateral carotid arteries: Secondary | ICD-10-CM | POA: Diagnosis not present

## 2019-06-19 ENCOUNTER — Encounter: Payer: Self-pay | Admitting: Ophthalmology

## 2019-06-19 ENCOUNTER — Other Ambulatory Visit: Payer: Self-pay

## 2019-06-19 DIAGNOSIS — H2512 Age-related nuclear cataract, left eye: Secondary | ICD-10-CM | POA: Diagnosis not present

## 2019-06-20 ENCOUNTER — Telehealth: Payer: Self-pay | Admitting: Emergency Medicine

## 2019-06-20 NOTE — Telephone Encounter (Signed)
Returned call to patient regarding message that he had called saying he is coughing up blood and would like to be seen ASAP. Spoke with patient who stated that the last two mornings after waking up and then this afternoon after mowing the grass he coughed up "just a little blood" and it was a mix of some dark red blood and some bright red blood. No other bleeding, and only coughed up blood those three occassions. Explained to pt that it could be related to some sinus issues from allergies, and pt stated that he thought that it was probably related to that, but he wanted to be safe. Told pt that he should follow up with his PCP if he felt that he needed to be evaluated. Pt verbalized understanding and didn't have any further questions or concerns.

## 2019-06-22 DIAGNOSIS — R05 Cough: Secondary | ICD-10-CM | POA: Diagnosis not present

## 2019-06-22 DIAGNOSIS — R0989 Other specified symptoms and signs involving the circulatory and respiratory systems: Secondary | ICD-10-CM | POA: Diagnosis not present

## 2019-06-26 ENCOUNTER — Other Ambulatory Visit: Payer: Self-pay

## 2019-06-26 ENCOUNTER — Other Ambulatory Visit
Admission: RE | Admit: 2019-06-26 | Discharge: 2019-06-26 | Disposition: A | Discharge status: Home / Self Care | Payer: Medicare HMO | Source: Ambulatory Visit | Attending: Ophthalmology | Admitting: Ophthalmology

## 2019-06-26 DIAGNOSIS — R05 Cough: Secondary | ICD-10-CM | POA: Diagnosis not present

## 2019-06-26 DIAGNOSIS — Z01812 Encounter for preprocedural laboratory examination: Secondary | ICD-10-CM | POA: Insufficient documentation

## 2019-06-26 DIAGNOSIS — Z20822 Contact with and (suspected) exposure to covid-19: Secondary | ICD-10-CM | POA: Insufficient documentation

## 2019-06-26 LAB — SARS CORONAVIRUS 2 (TAT 6-24 HRS): SARS Coronavirus 2: NEGATIVE

## 2019-06-26 NOTE — Discharge Instructions (Signed)

## 2019-06-28 ENCOUNTER — Encounter
Admission: RE | Disposition: A | Discharge status: Home / Self Care | Payer: Self-pay | Source: Home / Self Care | Attending: Ophthalmology

## 2019-06-28 ENCOUNTER — Ambulatory Visit: Payer: Medicare HMO | Admitting: Anesthesiology

## 2019-06-28 ENCOUNTER — Ambulatory Visit
Admission: RE | Admit: 2019-06-28 | Discharge: 2019-06-28 | Disposition: A | Discharge status: Home / Self Care | Payer: Medicare HMO | Attending: Ophthalmology | Admitting: Ophthalmology

## 2019-06-28 ENCOUNTER — Other Ambulatory Visit: Payer: Self-pay

## 2019-06-28 DIAGNOSIS — I2510 Atherosclerotic heart disease of native coronary artery without angina pectoris: Secondary | ICD-10-CM | POA: Diagnosis not present

## 2019-06-28 DIAGNOSIS — E119 Type 2 diabetes mellitus without complications: Secondary | ICD-10-CM | POA: Insufficient documentation

## 2019-06-28 DIAGNOSIS — H25812 Combined forms of age-related cataract, left eye: Secondary | ICD-10-CM | POA: Diagnosis not present

## 2019-06-28 DIAGNOSIS — H2512 Age-related nuclear cataract, left eye: Secondary | ICD-10-CM | POA: Insufficient documentation

## 2019-06-28 DIAGNOSIS — Z6839 Body mass index (BMI) 39.0-39.9, adult: Secondary | ICD-10-CM | POA: Insufficient documentation

## 2019-06-28 DIAGNOSIS — I509 Heart failure, unspecified: Secondary | ICD-10-CM | POA: Insufficient documentation

## 2019-06-28 DIAGNOSIS — I110 Hypertensive heart disease with heart failure: Secondary | ICD-10-CM | POA: Insufficient documentation

## 2019-06-28 HISTORY — PX: CATARACT EXTRACTION W/PHACO: SHX586

## 2019-06-28 LAB — GLUCOSE, CAPILLARY
Glucose-Capillary: 188 mg/dL — ABNORMAL HIGH (ref 70–99)
Glucose-Capillary: 183 mg/dL — ABNORMAL HIGH (ref 70–99)

## 2019-06-28 SURGERY — PHACOEMULSIFICATION, CATARACT, WITH IOL INSERTION
Anesthesia: Monitor Anesthesia Care | Site: Eye | Laterality: Left

## 2019-06-28 MED ORDER — BRIMONIDINE TARTRATE-TIMOLOL 0.2-0.5 % OP SOLN
OPHTHALMIC | Status: DC | PRN
  Administered 2019-06-28: 1 [drp] via OPHTHALMIC

## 2019-06-28 MED ORDER — EPINEPHRINE PF 1 MG/ML IJ SOLN
INTRAOCULAR | Status: DC | PRN
  Administered 2019-06-28: 71 mL via OPHTHALMIC

## 2019-06-28 MED ORDER — FENTANYL CITRATE (PF) 100 MCG/2ML IJ SOLN
INTRAMUSCULAR | Status: DC | PRN
  Administered 2019-06-28 (×2): 50 ug via INTRAVENOUS

## 2019-06-28 MED ORDER — TETRACAINE HCL 0.5 % OP SOLN
1.0000 [drp] | OPHTHALMIC | Status: DC | PRN
  Administered 2019-06-28 (×3): 1 [drp] via OPHTHALMIC

## 2019-06-28 MED ORDER — LIDOCAINE HCL (PF) 2 % IJ SOLN
INTRAOCULAR | Status: DC | PRN
  Administered 2019-06-28: 1 mL

## 2019-06-28 MED ORDER — LACTATED RINGERS IV SOLN: 100.0000 mL/h | INTRAVENOUS | Status: DC

## 2019-06-28 MED ORDER — LACTATED RINGERS IV SOLN: 10.0000 mL/h | INTRAVENOUS | Status: DC

## 2019-06-28 MED ORDER — MOXIFLOXACIN HCL 0.5 % OP SOLN
1.0000 [drp] | OPHTHALMIC | Status: DC | PRN
  Administered 2019-06-28 (×3): 1 [drp] via OPHTHALMIC

## 2019-06-28 MED ORDER — ACETAMINOPHEN 325 MG PO TABS: 325.0000 mg | ORAL_TABLET | Freq: Once | ORAL | Status: DC

## 2019-06-28 MED ORDER — CEFUROXIME OPHTHALMIC INJECTION 1 MG/0.1 ML
INJECTION | OPHTHALMIC | Status: DC | PRN
  Administered 2019-06-28: 0.1 mL via INTRACAMERAL

## 2019-06-28 MED ORDER — MIDAZOLAM HCL 2 MG/2ML IJ SOLN
INTRAMUSCULAR | Status: DC | PRN
  Administered 2019-06-28 (×2): 1 mg via INTRAVENOUS

## 2019-06-28 MED ORDER — ARMC OPHTHALMIC DILATING DROPS
1.0000 "application " | OPHTHALMIC | Status: DC | PRN
  Administered 2019-06-28 (×3): 1 via OPHTHALMIC

## 2019-06-28 MED ORDER — NA HYALUR & NA CHOND-NA HYALUR 0.4-0.35 ML IO KIT
PACK | INTRAOCULAR | Status: DC | PRN
  Administered 2019-06-28: 1 mL via INTRAOCULAR

## 2019-06-28 MED ORDER — ACETAMINOPHEN 160 MG/5ML PO SOLN: 325.0000 mg | Freq: Once | ORAL | Status: DC

## 2019-06-28 SURGICAL SUPPLY — 22 items
CANNULA ANT/CHMB 27G (MISCELLANEOUS) ×1 IMPLANT
CANNULA ANT/CHMB 27GA (MISCELLANEOUS) ×3 IMPLANT
GLOVE SURG LX 7.5 STRW (GLOVE) ×2
GLOVE SURG LX STRL 7.5 STRW (GLOVE) ×1 IMPLANT
GLOVE SURG TRIUMPH 8.0 PF LTX (GLOVE) ×3 IMPLANT
GOWN STRL REUS W/ TWL LRG LVL3 (GOWN DISPOSABLE) ×2 IMPLANT
GOWN STRL REUS W/TWL LRG LVL3 (GOWN DISPOSABLE) ×6
LENS IOL TECNIS ITEC 19.5 (Intraocular Lens) ×2 IMPLANT
MARKER SKIN DUAL TIP RULER LAB (MISCELLANEOUS) ×3 IMPLANT
NDL CAPSULORHEX 25GA (NEEDLE) ×1 IMPLANT
NDL FILTER BLUNT 18X1 1/2 (NEEDLE) ×2 IMPLANT
NEEDLE CAPSULORHEX 25GA (NEEDLE) ×3 IMPLANT
NEEDLE FILTER BLUNT 18X 1/2SAF (NEEDLE) ×4
NEEDLE FILTER BLUNT 18X1 1/2 (NEEDLE) ×2 IMPLANT
PACK CATARACT BRASINGTON (MISCELLANEOUS) ×3 IMPLANT
PACK EYE AFTER SURG (MISCELLANEOUS) ×3 IMPLANT
PACK OPTHALMIC (MISCELLANEOUS) ×3 IMPLANT
SOLUTION OPHTHALMIC SALT (MISCELLANEOUS) ×3 IMPLANT
SYR 3ML LL SCALE MARK (SYRINGE) ×6 IMPLANT
SYR TB 1ML LUER SLIP (SYRINGE) ×3 IMPLANT
WATER STERILE IRR 250ML POUR (IV SOLUTION) ×3 IMPLANT
WIPE NON LINTING 3.25X3.25 (MISCELLANEOUS) ×3 IMPLANT

## 2019-06-28 NOTE — Anesthesia Postprocedure Evaluation (Signed)
Anesthesia Post Note  Patient: [name]  Procedure(s) Performed: CATARACT EXTRACTION PHACO AND INTRAOCULAR LENS PLACEMENT (IOC) LEFT DIABETIC 7.99  01:13.4  10.9%  (Left Eye)     Patient location during evaluation: PACU Anesthesia Type: MAC Level of consciousness: awake and alert and oriented Pain management: satisfactory to patient Vital Signs Assessment: post-procedure vital signs reviewed and stable Respiratory status: spontaneous breathing, nonlabored ventilation and respiratory function stable Cardiovascular status: blood pressure returned to baseline and stable Postop Assessment: Adequate PO intake and No signs of nausea or vomiting Anesthetic complications: no    Raliegh Ip

## 2019-06-28 NOTE — Op Note (Signed)
OPERATIVE NOTE  BEXTON CHARRON [id number] 06/28/2019   PREOPERATIVE DIAGNOSIS:  Nuclear sclerotic cataract left eye. H25.12   POSTOPERATIVE DIAGNOSIS:    Nuclear sclerotic cataract left eye.     PROCEDURE:  Phacoemusification with posterior chamber intraocular lens placement of the left eye  Ultrasound time: Procedure(s) with comments: CATARACT EXTRACTION PHACO AND INTRAOCULAR LENS PLACEMENT (IOC) LEFT DIABETIC 7.99  01:13.4  10.9%  (Left) - Diabetic - insulin and oral meds  LENS:   Implant Name Type Inv. Item Serial No. Manufacturer Lot No. LRB No. Used Action  LENS IOL DIOP 19.5 - [id number] Intraocular Lens LENS IOL DIOP 19.5 [id number]  Left 1 Implanted      SURGEON:  Wyonia Hough, MD   ANESTHESIA:  Topical with tetracaine drops and 2% Xylocaine jelly, augmented with 1% preservative-free intracameral lidocaine.    COMPLICATIONS:  None.   DESCRIPTION OF PROCEDURE:  The patient was identified in the holding room and transported to the operating room and placed in the supine position under the operating microscope.  The left eye was identified as the operative eye and it was prepped and draped in the usual sterile ophthalmic fashion.   A 1 millimeter clear-corneal paracentesis was made at the 1:30 position.  0.5 ml of preservative-free 1% lidocaine was injected into the anterior chamber.  The anterior chamber was filled with Viscoat viscoelastic.  A 2.4 millimeter keratome was used to make a near-clear corneal incision at the 10:30 position.  .  A curvilinear capsulorrhexis was made with a cystotome and capsulorrhexis forceps.  Balanced salt solution was used to hydrodissect and hydrodelineate the nucleus.   Phacoemulsification was then used in stop and chop fashion to remove the lens nucleus and epinucleus.  The remaining cortex was then removed using the irrigation and aspiration handpiece. Provisc was then placed into the capsular bag to distend it for lens placement.   A lens was then injected into the capsular bag.  The remaining viscoelastic was aspirated.   Wounds were hydrated with balanced salt solution.  The anterior chamber was inflated to a physiologic pressure with balanced salt solution.  No wound leaks were noted. Cefuroxime 0.1 ml of a 10mg /ml solution was injected into the anterior chamber for a dose of 1 mg of intracameral antibiotic at the completion of the case.   Timolol and Brimonidine drops were applied to the eye.  The patient was taken to the recovery room in stable condition without complications of anesthesia or surgery.  Dianna Deshler 06/28/2019, 10:02 AM

## 2019-06-28 NOTE — H&P (Signed)

## 2019-06-28 NOTE — Anesthesia Procedure Notes (Signed)
Procedure Name: MAC Date/Time: 06/28/2019 9:45 AM Performed by: Georga Bora, CRNA Pre-anesthesia Checklist: Patient identified, Emergency Drugs available, Suction available, Patient being monitored and Timeout performed Patient Re-evaluated:Patient Re-evaluated prior to induction Oxygen Delivery Method: Nasal cannula

## 2019-06-28 NOTE — Transfer of Care (Signed)
Immediate Anesthesia Transfer of Care Note  Patient: [name]  Procedure(s) Performed: CATARACT EXTRACTION PHACO AND INTRAOCULAR LENS PLACEMENT (IOC) LEFT DIABETIC 7.99  01:13.4  10.9%  (Left Eye)  Patient Location: PACU  Anesthesia Type: MAC  Level of Consciousness: awake, alert  and patient cooperative  Airway and Oxygen Therapy: Patient Spontanous Breathing and Patient connected to supplemental oxygen  Post-op Assessment: Post-op Vital signs reviewed, Patient's Cardiovascular Status Stable, Respiratory Function Stable, Patent Airway and No signs of Nausea or vomiting  Post-op Vital Signs: Reviewed and stable  Complications: No apparent anesthesia complications

## 2019-06-28 NOTE — Anesthesia Preprocedure Evaluation (Signed)
Anesthesia Evaluation  Patient identified by MRN, date of birth, ID band Patient awake    Reviewed: Allergy & Precautions, H&P , NPO status , Patient's Chart, lab work & pertinent test results  Airway Mallampati: III  TM Distance: >3 FB Neck ROM: full    Dental no notable dental hx.    Pulmonary    Pulmonary exam normal breath sounds clear to auscultation       Cardiovascular hypertension, + CAD and +CHF  Normal cardiovascular exam Rhythm:regular Rate:Normal     Neuro/Psych    GI/Hepatic   Endo/Other  diabetes, Type 2Morbid obesity  Renal/GU      Musculoskeletal   Abdominal   Peds  Hematology  (+) Blood dyscrasia, anemia ,   Anesthesia Other Findings   Reproductive/Obstetrics                             Anesthesia Physical Anesthesia Plan  ASA: III  Anesthesia Plan: MAC   Post-op Pain Management:    Induction:   PONV Risk Score and Plan: 1 and Treatment may vary due to age or medical condition, TIVA and Midazolam  Airway Management Planned:   Additional Equipment:   Intra-op Plan:   Post-operative Plan:   Informed Consent: I have reviewed the patients History and Physical, chart, labs and discussed the procedure including the risks, benefits and alternatives for the proposed anesthesia with the patient or authorized representative who has indicated his/her understanding and acceptance.     Dental Advisory Given  Plan Discussed with: CRNA  Anesthesia Plan Comments:         Anesthesia Quick Evaluation

## 2019-06-29 ENCOUNTER — Encounter: Payer: Self-pay | Admitting: *Deleted

## 2019-08-03 DIAGNOSIS — E1159 Type 2 diabetes mellitus with other circulatory complications: Secondary | ICD-10-CM | POA: Diagnosis not present

## 2019-08-03 DIAGNOSIS — I2510 Atherosclerotic heart disease of native coronary artery without angina pectoris: Secondary | ICD-10-CM | POA: Diagnosis not present

## 2019-08-03 DIAGNOSIS — D509 Iron deficiency anemia, unspecified: Secondary | ICD-10-CM | POA: Diagnosis not present

## 2019-08-03 DIAGNOSIS — Z86010 Personal history of colonic polyps: Secondary | ICD-10-CM | POA: Diagnosis not present

## 2019-08-03 DIAGNOSIS — I5022 Chronic systolic (congestive) heart failure: Secondary | ICD-10-CM | POA: Diagnosis not present

## 2019-08-24 ENCOUNTER — Other Ambulatory Visit: Payer: Self-pay

## 2019-08-24 ENCOUNTER — Other Ambulatory Visit: Payer: Self-pay | Admitting: *Deleted

## 2019-08-24 ENCOUNTER — Inpatient Hospital Stay: Payer: Medicare HMO | Attending: Oncology

## 2019-08-24 DIAGNOSIS — D509 Iron deficiency anemia, unspecified: Secondary | ICD-10-CM

## 2019-08-24 LAB — CBC WITH DIFFERENTIAL/PLATELET
Abs Immature Granulocytes: 0.04 10*3/uL (ref 0.00–0.07)
Basophils Absolute: 0.1 10*3/uL (ref 0.0–0.1)
Basophils Relative: 1 %
Eosinophils Absolute: 0.1 10*3/uL (ref 0.0–0.5)
Eosinophils Relative: 2 %
HCT: 32.1 % — ABNORMAL LOW (ref 39.0–52.0)
Hemoglobin: 10.8 g/dL — ABNORMAL LOW (ref 13.0–17.0)
Immature Granulocytes: 1 %
Lymphocytes Relative: 12 %
Lymphs Abs: 1 10*3/uL (ref 0.7–4.0)
MCH: 30.1 pg (ref 26.0–34.0)
MCHC: 33.6 g/dL (ref 30.0–36.0)
MCV: 89.4 fL (ref 80.0–100.0)
Monocytes Absolute: 0.4 10*3/uL (ref 0.1–1.0)
Monocytes Relative: 5 %
Neutro Abs: 6.4 10*3/uL (ref 1.7–7.7)
Neutrophils Relative %: 79 %
Platelets: 185 10*3/uL (ref 150–400)
RBC: 3.59 MIL/uL — ABNORMAL LOW (ref 4.22–5.81)
RDW: 14.2 % (ref 11.5–15.5)
WBC: 8 10*3/uL (ref 4.0–10.5)
nRBC: 0 % (ref 0.0–0.2)

## 2019-08-24 LAB — IRON AND TIBC
Iron: 127 ug/dL (ref 45–182)
Saturation Ratios: 46 % — ABNORMAL HIGH (ref 17.9–39.5)
TIBC: 279 ug/dL (ref 250–450)
UIBC: 152 ug/dL

## 2019-08-24 LAB — FERRITIN: Ferritin: 67 ng/mL (ref 24–336)

## 2019-08-24 NOTE — Progress Notes (Signed)
cb[facility]

## 2019-08-25 ENCOUNTER — Encounter: Payer: Self-pay | Admitting: Oncology

## 2019-08-26 NOTE — Progress Notes (Signed)
[place]  Telephone:(336) [phone] Fax:(336) [phone]  ID: [name] OB: 02-Jun-1950  MR#: [id number]  QHU#:[id number]  Patient Care Team: Juluis Pitch, MD as PCP - General (Family Medicine) Efrain Sella, MD as Consulting Physician (Gastroenterology) Lloyd Huger, MD as Consulting Physician (Oncology)  CHIEF COMPLAINT: Iron deficiency anemia.  INTERVAL HISTORY: Patient returns to clinic today for repeat laboratory work, further evaluation, and consideration of additional IV iron.  He currently feels well and is asymptomatic.  He does not complain of any weakness or fatigue. He has no neurologic complaints.  He denies any recent fevers or illnesses.  He has a good appetite and denies weight loss.  He has no chest pain, shortness of breath, cough, or hemoptysis.  He denies any nausea, vomiting, constipation, or diarrhea.  He has no melena or hematochezia.  He has no urinary complaints.  Patient feels at his baseline and offers no specific complaints today.  REVIEW OF SYSTEMS:   Review of Systems  Constitutional: Negative.  Negative for fever, malaise/fatigue and weight loss.  Respiratory: Negative.  Negative for cough, hemoptysis and shortness of breath.   Cardiovascular: Negative.  Negative for chest pain and leg swelling.  Gastrointestinal: Negative.  Negative for abdominal pain, blood in stool and melena.  Genitourinary: Negative.  Negative for hematuria.  Musculoskeletal: Negative.  Negative for back pain.  Skin: Negative.  Negative for rash.  Neurological: Negative.  Negative for dizziness, focal weakness, weakness and headaches.  Psychiatric/Behavioral: Negative.  The patient is not nervous/anxious.     As per HPI. Otherwise, a complete review of systems is negative.  PAST MEDICAL HISTORY: Past Medical History:  Diagnosis Date  . Arthritis   . CHF (congestive heart failure), NYHA class II, acute on chronic, systolic (HCC)    EF 46%  07/2018  . Coronary artery disease    [facility]  . Diabetes mellitus without complication ([place])   . Hyperlipidemia   . Hypertension   . Numbness in right leg    due to back  . Skin cancer    removed l arm  . Spinal stenosis   . Stented coronary artery 2000   X 1 STENT  . Swelling of both lower extremities     PAST SURGICAL HISTORY: Past Surgical History:  Procedure Laterality Date  . CATARACT EXTRACTION W/PHACO Right 05/03/2019   Procedure: CATARACT EXTRACTION PHACO AND INTRAOCULAR LENS PLACEMENT (IOC) RIGHT DIABETIC 6.44 00:58.4 11.0%;  Surgeon: Leandrew Koyanagi, MD;  Location: [place];  Service: Ophthalmology;  Laterality: Right;  DIABETIC  . CATARACT EXTRACTION W/PHACO Left 06/28/2019   Procedure: CATARACT EXTRACTION PHACO AND INTRAOCULAR LENS PLACEMENT (IOC) LEFT DIABETIC 7.99  01:13.4  10.9% ;  Surgeon: Leandrew Koyanagi, MD;  Location: [place];  Service: Ophthalmology;  Laterality: Left;  Diabetic - insulin and oral meds  . CHOLECYSTECTOMY    . JOINT REPLACEMENT     RT KNEE  . LUMBAR LAMINECTOMY/DECOMPRESSION MICRODISCECTOMY  04/13/2012   Procedure: LUMBAR LAMINECTOMY/DECOMPRESSION MICRODISCECTOMY 1 LEVEL;  Surgeon: Johnn Hai, MD;  Location: [facility];  Service: Orthopedics;  Laterality: Bilateral;  L4-L5  . UVULECTOMY  1990's    FAMILY HISTORY: History reviewed. No pertinent family history.  ADVANCED DIRECTIVES (Y/N):  N  HEALTH MAINTENANCE: Social History   Tobacco Use  . Smoking status: Never Smoker  . Smokeless tobacco: Never Used  Vaping Use  . Vaping Use: Never used  Substance Use Topics  . Alcohol use: Yes  Comment: rare  . Drug use: No     Colonoscopy:  PAP:  Bone density:  Lipid panel:  Allergies  Allergen Reactions  . Metoprolol Other (See Comments)    Cant remember  . Toprol Xl [Metoprolol Tartrate] Other (See Comments)    Cant remember  . Buprenorphine Hcl Itching and Rash  . Morphine  Itching and Other (See Comments)  . Morphine And Related Itching and Rash  . Triamcinolone Acetonide Rash    Current Outpatient Medications  Medication Sig Dispense Refill  . aspirin 325 MG tablet Take 1 tablet (325 mg total) by mouth daily.    Marland Kitchen atorvastatin (LIPITOR) 40 MG tablet Take 80 mg by mouth every evening.     . ferrous sulfate 325 (65 FE) MG EC tablet Take 1 tablet (325 mg total) by mouth 2 (two) times daily. 60 tablet 3  . furosemide (LASIX) 20 MG tablet Take 20 mg by mouth.    Marland Kitchen glipiZIDE (GLUCOTROL XL) 5 MG 24 hr tablet Take 5 mg by mouth 2 (two) times daily.    . insulin aspart (NOVOLOG) 100 UNIT/ML injection Inject into the skin 3 (three) times daily before meals.    . insulin glargine (LANTUS) 100 UNIT/ML injection Inject 44 Units into the skin daily.    . isosorbide mononitrate (IMDUR) 30 MG 24 hr tablet Take by mouth.    . losartan (COZAAR) 50 MG tablet Take 100 mg by mouth every evening.     . metFORMIN (GLUCOPHAGE) 1000 MG tablet Take 1,000 mg by mouth 2 (two) times daily with a meal.    . naproxen sodium (ANAPROX) 220 MG tablet Take 220 mg by mouth 2 (two) times daily as needed.      No current facility-administered medications for this visit.    OBJECTIVE: Vitals:   08/25/19 1558  BP: (!) 156/62  Pulse: 90  Resp: 19  Temp: 98 F (36.7 C)  SpO2: 98%     Body mass index is 39.72 kg/m.    ECOG FS:0 - Asymptomatic  General: Well-developed, well-nourished, no acute distress. Eyes: Pink conjunctiva, anicteric sclera. HEENT: Normocephalic, moist mucous membranes. Lungs: No audible wheezing or coughing. Heart: Regular rate and rhythm. Abdomen: Soft, nontender, no obvious distention. Musculoskeletal: No edema, cyanosis, or clubbing. Neuro: Alert, answering all questions appropriately. Cranial nerves grossly intact. Skin: No rashes or petechiae noted. Psych: Normal affect.   LAB RESULTS:  Lab Results  Component Value Date   NA 137 05/16/2019   K 4.7  05/16/2019   CL 103 05/16/2019   CO2 24 05/16/2019   GLUCOSE 269 (H) 05/16/2019   BUN 26 (H) 05/16/2019   CREATININE 1.14 05/16/2019   CALCIUM 8.8 (L) 05/16/2019   PROT 6.5 05/16/2019   ALBUMIN 3.6 05/16/2019   AST 14 (L) 05/16/2019   ALT 17 05/16/2019   ALKPHOS 80 05/16/2019   BILITOT 1.2 05/16/2019   GFRNONAA >60 05/16/2019   GFRAA >60 05/16/2019    Lab Results  Component Value Date   WBC 8.0 08/24/2019   NEUTROABS 6.4 08/24/2019   HGB 10.8 (L) 08/24/2019   HCT 32.1 (L) 08/24/2019   MCV 89.4 08/24/2019   PLT 185 08/24/2019   Lab Results  Component Value Date   IRON 127 08/24/2019   TIBC 279 08/24/2019   IRONPCTSAT 46 (H) 08/24/2019   Lab Results  Component Value Date   FERRITIN 67 08/24/2019     STUDIES: No results found.  ASSESSMENT: Iron deficiency anemia.  PLAN:  1.  Iron deficiency anemia: Patient's hemoglobin has significantly improved and is now 10.8.  Iron stores are within normal limits.  Previously, the remainder of his laboratory work was either negative or within normal limits.  He does not require additional IV Feraheme today.  He last received treatment on June 05, 2019.  Patient reports he has a GI appointment in July or August 2021.  Return to clinic in 3 months with repeat laboratory work, further evaluation, and consideration of additional IV Feraheme if needed.   I spent a total of 20 minutes reviewing chart data, face-to-face evaluation with the patient, counseling and coordination of care as detailed above.   Patient expressed understanding and was in agreement with this plan. He also understands that He can call clinic at any time with any questions, concerns, or complaints.    Lloyd Huger, MD   08/29/2019 6:52 AM

## 2019-08-28 ENCOUNTER — Inpatient Hospital Stay: Payer: Medicare HMO

## 2019-08-28 ENCOUNTER — Other Ambulatory Visit: Payer: Self-pay

## 2019-08-28 ENCOUNTER — Inpatient Hospital Stay (HOSPITAL_BASED_OUTPATIENT_CLINIC_OR_DEPARTMENT_OTHER): Payer: Medicare HMO | Admitting: Oncology

## 2019-08-28 ENCOUNTER — Encounter: Payer: Self-pay | Admitting: Oncology

## 2019-08-28 VITALS — BP 156/62 | HR 90 | Temp 98.0°F | Resp 19 | Wt 292.9 lb

## 2019-08-28 DIAGNOSIS — D509 Iron deficiency anemia, unspecified: Secondary | ICD-10-CM

## 2019-10-15 ENCOUNTER — Emergency Department
Admission: EM | Admit: 2019-10-15 | Discharge: 2019-10-15 | Disposition: A | Discharge status: Home / Self Care | Payer: Medicare HMO | Attending: Emergency Medicine | Admitting: Emergency Medicine

## 2019-10-15 ENCOUNTER — Encounter: Payer: Self-pay | Admitting: Emergency Medicine

## 2019-10-15 ENCOUNTER — Other Ambulatory Visit: Payer: Self-pay

## 2019-10-15 ENCOUNTER — Emergency Department: Payer: Medicare HMO

## 2019-10-15 DIAGNOSIS — E1165 Type 2 diabetes mellitus with hyperglycemia: Secondary | ICD-10-CM | POA: Diagnosis not present

## 2019-10-15 DIAGNOSIS — I2510 Atherosclerotic heart disease of native coronary artery without angina pectoris: Secondary | ICD-10-CM | POA: Diagnosis not present

## 2019-10-15 DIAGNOSIS — Z7982 Long term (current) use of aspirin: Secondary | ICD-10-CM | POA: Diagnosis not present

## 2019-10-15 DIAGNOSIS — J189 Pneumonia, unspecified organism: Secondary | ICD-10-CM | POA: Insufficient documentation

## 2019-10-15 DIAGNOSIS — Z794 Long term (current) use of insulin: Secondary | ICD-10-CM | POA: Insufficient documentation

## 2019-10-15 DIAGNOSIS — R042 Hemoptysis: Secondary | ICD-10-CM

## 2019-10-15 DIAGNOSIS — E1140 Type 2 diabetes mellitus with diabetic neuropathy, unspecified: Secondary | ICD-10-CM | POA: Insufficient documentation

## 2019-10-15 DIAGNOSIS — I5022 Chronic systolic (congestive) heart failure: Secondary | ICD-10-CM | POA: Diagnosis not present

## 2019-10-15 DIAGNOSIS — Z96651 Presence of right artificial knee joint: Secondary | ICD-10-CM | POA: Diagnosis not present

## 2019-10-15 DIAGNOSIS — J181 Lobar pneumonia, unspecified organism: Secondary | ICD-10-CM | POA: Diagnosis not present

## 2019-10-15 DIAGNOSIS — R739 Hyperglycemia, unspecified: Secondary | ICD-10-CM

## 2019-10-15 DIAGNOSIS — I10 Essential (primary) hypertension: Secondary | ICD-10-CM | POA: Insufficient documentation

## 2019-10-15 DIAGNOSIS — R918 Other nonspecific abnormal finding of lung field: Secondary | ICD-10-CM | POA: Diagnosis not present

## 2019-10-15 DIAGNOSIS — I700 Atherosclerosis of aorta: Secondary | ICD-10-CM | POA: Diagnosis not present

## 2019-10-15 LAB — CBC WITH DIFFERENTIAL/PLATELET
Abs Immature Granulocytes: 0.04 10*3/uL (ref 0.00–0.07)
Basophils Absolute: 0.1 10*3/uL (ref 0.0–0.1)
Basophils Relative: 1 %
Eosinophils Absolute: 0.1 10*3/uL (ref 0.0–0.5)
Eosinophils Relative: 1 %
HCT: 29.4 % — ABNORMAL LOW (ref 39.0–52.0)
Hemoglobin: 9.6 g/dL — ABNORMAL LOW (ref 13.0–17.0)
Immature Granulocytes: 1 %
Lymphocytes Relative: 11 %
Lymphs Abs: 0.9 10*3/uL (ref 0.7–4.0)
MCH: 30.4 pg (ref 26.0–34.0)
MCHC: 32.7 g/dL (ref 30.0–36.0)
MCV: 93 fL (ref 80.0–100.0)
Monocytes Absolute: 0.4 10*3/uL (ref 0.1–1.0)
Monocytes Relative: 5 %
Neutro Abs: 6.5 10*3/uL (ref 1.7–7.7)
Neutrophils Relative %: 81 %
Platelets: 200 10*3/uL (ref 150–400)
RBC: 3.16 MIL/uL — ABNORMAL LOW (ref 4.22–5.81)
RDW: 15.6 % — ABNORMAL HIGH (ref 11.5–15.5)
WBC: 8.1 10*3/uL (ref 4.0–10.5)
nRBC: 0 % (ref 0.0–0.2)

## 2019-10-15 LAB — BASIC METABOLIC PANEL
Anion gap: 10 (ref 5–15)
BUN: 27 mg/dL — ABNORMAL HIGH (ref 8–23)
CO2: 22 mmol/L (ref 22–32)
Calcium: 8.7 mg/dL — ABNORMAL LOW (ref 8.9–10.3)
Chloride: 104 mmol/L (ref 98–111)
Creatinine, Ser: 1.04 mg/dL (ref 0.61–1.24)
GFR calc Af Amer: >60 mL/min (ref >60)
GFR calc non Af Amer: >60 mL/min (ref >60)
Glucose, Bld: 380 mg/dL — ABNORMAL HIGH (ref 70–99)
Potassium: 4.6 mmol/L (ref 3.5–5.1)
Sodium: 136 mmol/L (ref 135–145)

## 2019-10-15 LAB — FIBRINOGEN: Fibrinogen: 396 mg/dL (ref 210–475)

## 2019-10-15 LAB — PROTIME-INR
INR: 1 (ref 0.8–1.2)
Prothrombin Time: 12.3 seconds (ref 11.4–15.2)

## 2019-10-15 MED ORDER — IOHEXOL 350 MG/ML SOLN
100.0000 mL | Freq: Once | INTRAVENOUS | Status: AC | PRN
  Administered 2019-10-15: 100 mL via INTRAVENOUS

## 2019-10-15 MED ORDER — AMOXICILLIN-POT CLAVULANATE 875-125 MG PO TABS: 1.0000 | ORAL_TABLET | Freq: Two times a day (BID) | ORAL | 0 refills | Status: AC

## 2019-10-15 MED ORDER — DOXYCYCLINE HYCLATE 100 MG PO TABS: 100.0000 mg | ORAL_TABLET | Freq: Two times a day (BID) | ORAL | 0 refills | Status: AC

## 2019-10-15 NOTE — ED Notes (Signed)
Pt taken for CT 

## 2019-10-15 NOTE — ED Triage Notes (Signed)
Pt to ED via POV c/o hemoptysis since yesterday morning. Pt states that he started cough up mucus and then he started coughing up dark red blood. Pt take 81 mg ASA, pt states that he also started a study for a new heart medication on Wednesday. Pt has pill bottle with him, unsure if this is related. Pt is in NAD.

## 2019-10-15 NOTE — ED Provider Notes (Signed)
[facility] Emergency Department Provider Note ____________________________________________   First MD Initiated Contact with Patient 10/15/19 1105     (approximate)  I have reviewed the triage vital signs and the nursing notes.  HISTORY  Chief Complaint Hemoptysis   HPI [name] is a 69 y.o. male presents to the ED for evaluation of hemoptysis.  Chart review indicates history of CHF with EF of 40%, CAD with DES x1 remotely, follows with Dr. [name].  HTN, HLD, DM on oral agents and insulin.  Patient reports starting to feel poorly 3 days ago, on Friday, with nonproductive dry "nagging" cough.  Patient reports coughing up a small amount of mucus yesterday on Saturday that was clear and yellow in color, and then today began noticing hemoptysis.  Patient reports coughing up a small amount of dark red blood, no bright red blood.  Reports this is progressively worsened such that he is now coughing up large dark red clots.  No active bleeding or blood without coughing.  No vomiting.  Patient denies fevers, syncope, trauma to the chest or back, abdominal pain, vomiting, diarrhea or headache.    Past Medical History:  Diagnosis Date  . Arthritis   . CHF (congestive heart failure), NYHA class II, acute on chronic, systolic (HCC)    EF 82% 11/9369  . Coronary artery disease    [facility]  . Diabetes mellitus without complication ([place])   . Hyperlipidemia   . Hypertension   . Numbness in right leg    due to back  . Skin cancer    removed l arm  . Spinal stenosis   . Stented coronary artery 2000   X 1 STENT  . Swelling of both lower extremities     Patient Active Problem List   Diagnosis Date Noted  . Iron deficiency anemia 05/23/2019  . Anemia, unspecified 05/21/2019  . Bilateral carotid artery stenosis 07/12/2018  . Osteoarthritis of right hip 05/09/2018  . Pain in joint of right hip 04/12/2018  . Diabetes mellitus type 2 in  obese ([place]) 11/18/2017  . Essential (primary) hypertension 11/18/2017  . Obesity, Class III, BMI 40-49.9 (morbid obesity) ([place]) 09/10/2016  . SOBOE (shortness of breath on exertion) 09/10/2016  . Melanoma of neck ([place]) 10/01/2015  . Type 2 diabetes mellitus with hyperglycemia ([place]) 10/30/2014  . CAD (coronary artery disease) 04/16/2014  . Chronic systolic CHF (congestive heart failure), NYHA class 2 ([place]) 04/16/2014  . Back pain 08/30/2013  . Hyperlipidemia, mixed 08/30/2013  . Lumbar spinal stenosis 04/13/2012  . Diabetic neuropathy ([place]) 05/28/2011  . Renal insufficiency 05/28/2011    Past Surgical History:  Procedure Laterality Date  . CATARACT EXTRACTION W/PHACO Right 05/03/2019   Procedure: CATARACT EXTRACTION PHACO AND INTRAOCULAR LENS PLACEMENT (IOC) RIGHT DIABETIC 6.44 00:58.4 11.0%;  Surgeon: Leandrew Koyanagi, MD;  Location: [facility];  Service: Ophthalmology;  Laterality: Right;  DIABETIC  . CATARACT EXTRACTION W/PHACO Left 06/28/2019   Procedure: CATARACT EXTRACTION PHACO AND INTRAOCULAR LENS PLACEMENT (IOC) LEFT DIABETIC 7.99  01:13.4  10.9% ;  Surgeon: Leandrew Koyanagi, MD;  Location: [facility];  Service: Ophthalmology;  Laterality: Left;  Diabetic - insulin and oral meds  . CHOLECYSTECTOMY    . JOINT REPLACEMENT     RT KNEE  . LUMBAR LAMINECTOMY/DECOMPRESSION MICRODISCECTOMY  04/13/2012   Procedure: LUMBAR LAMINECTOMY/DECOMPRESSION MICRODISCECTOMY 1 LEVEL;  Surgeon: Johnn Hai, MD;  Location: [facility];  Service: Orthopedics;  Laterality: Bilateral;  L4-L5  . UVULECTOMY  1990's    Prior to Admission medications   Medication Sig Start Date End Date Taking? Authorizing Provider  amoxicillin-clavulanate (AUGMENTIN) 875-125 MG tablet Take 1 tablet by mouth every 12 (twelve) hours for 7 days. 10/15/19 10/22/19  Vladimir Crofts, MD  aspirin 325 MG tablet Take 1 tablet (325 mg total) by mouth daily. 04/17/12   Cecilie Kicks, PA-C  atorvastatin (LIPITOR)  40 MG tablet Take 80 mg by mouth every evening.     [provider]  doxycycline (VIBRA-TABS) 100 MG tablet Take 1 tablet (100 mg total) by mouth 2 (two) times daily for 7 days. 10/15/19 10/22/19  Vladimir Crofts, MD  ferrous sulfate 325 (65 FE) MG EC tablet Take 1 tablet (325 mg total) by mouth 2 (two) times daily. 05/16/19 05/15/20  Blake Divine, MD  furosemide (LASIX) 20 MG tablet Take 20 mg by mouth.    [provider]  glipiZIDE (GLUCOTROL XL) 5 MG 24 hr tablet Take 5 mg by mouth 2 (two) times daily.    [provider]  insulin aspart (NOVOLOG) 100 UNIT/ML injection Inject into the skin 3 (three) times daily before meals.    [provider]  insulin glargine (LANTUS) 100 UNIT/ML injection Inject 44 Units into the skin daily.    [provider]  isosorbide mononitrate (IMDUR) 30 MG 24 hr tablet Take by mouth. 05/10/19 05/09/20  [provider]  losartan (COZAAR) 50 MG tablet Take 100 mg by mouth every evening.     [provider]  metFORMIN (GLUCOPHAGE) 1000 MG tablet Take 1,000 mg by mouth 2 (two) times daily with a meal.    [provider]  naproxen sodium (ANAPROX) 220 MG tablet Take 220 mg by mouth 2 (two) times daily as needed.     [provider]    Allergies Metoprolol, Toprol xl [metoprolol tartrate], Buprenorphine hcl, Morphine, Morphine and related, and Triamcinolone acetonide  No family history on file.  Social History Social History   Tobacco Use  . Smoking status: Never Smoker  . Smokeless tobacco: Never Used  Vaping Use  . Vaping Use: Never used  Substance Use Topics  . Alcohol use: Yes    Comment: rare  . Drug use: No    Review of Systems  Constitutional: No fever/chills Eyes: No visual changes. ENT: No sore throat. Cardiovascular: Denies chest pain. Respiratory: Denies shortness of breath.  Positive for hemoptysis. Gastrointestinal: No abdominal pain.  No nausea, no vomiting.  No  diarrhea.  No constipation. Genitourinary: Negative for dysuria. Musculoskeletal: Negative for back pain. Skin: Negative for rash. Neurological: Negative for headaches, focal weakness or numbness.   ____________________________________________   PHYSICAL EXAM:  VITAL SIGNS: Vitals:   10/15/19 1024 10/15/19 1330  BP: (!) 129/67 (!) 150/73  Pulse: 102 89  Resp: 16   SpO2: 96% 97%      Constitutional: Alert and oriented. Well appearing and in no acute distress.  Pleasant, obese, sitting up in bed and conversational full sentences without distress. Eyes: Conjunctivae are normal. PERRL. EOMI. Head: Atraumatic. Nose: No congestion/rhinnorhea. Mouth/Throat: Mucous membranes are moist.  Oropharynx non-erythematous. Neck: No stridor. No cervical spine tenderness to palpation. Cardiovascular: Normal rate, regular rhythm. Grossly normal heart sounds.  Good peripheral circulation. Respiratory: Normal respiratory effort.  No retractions. Lungs CTAB. Gastrointestinal: Soft , nondistended, nontender to palpation. No abdominal bruits. No CVA tenderness. Musculoskeletal: No lower extremity tenderness nor edema.  No joint effusions. No signs of acute trauma. Neurologic:  Normal speech and language.  No gross focal neurologic deficits are appreciated. No gait instability noted. Skin:  Skin is warm, dry and intact. No rash noted. Psychiatric: Mood and affect are normal. Speech and behavior are normal.  ____________________________________________   LABS (all labs ordered are listed, but only abnormal results are displayed)  Labs Reviewed  CBC WITH DIFFERENTIAL/PLATELET - Abnormal; Notable for the following components:      Result Value   RBC 3.16 (*)    Hemoglobin 9.6 (*)    HCT 29.4 (*)    RDW 15.6 (*)    All other components within normal limits  BASIC METABOLIC PANEL - Abnormal; Notable for the following components:   Glucose, Bld 380 (*)    BUN 27 (*)    Calcium 8.7 (*)    All  other components within normal limits  FIBRINOGEN  PROTIME-INR   ____________________________________________  12 Lead EKG   ____________________________________________  RADIOLOGY  ED MD interpretation: CXR with LLL opacities concerning for pneumonia  Official radiology report(s): DG Chest 2 View  Result Date: 10/15/2019 CLINICAL DATA:  Hemoptysis. EXAM: CHEST - 2 VIEW COMPARISON:  Chest x-ray dated 04/06/2012 FINDINGS: Heart size and mediastinal contours are within normal limits. Hazy opacities within the LEFT lower lobe. Lungs otherwise clear. No pneumothorax is seen. IMPRESSION: Hazy opacities within the LEFT lower lobe, suspicious for pneumonia. Electronically Signed   By: Franki Cabot M.D.   On: 10/15/2019 11:20   CT Angio Chest PE W and/or Wo Contrast  Result Date: 10/15/2019 CLINICAL DATA:  Hemoptysis.  Technique cardia.  PE suspected. EXAM: CT ANGIOGRAPHY CHEST WITH CONTRAST TECHNIQUE: Multidetector CT imaging of the chest was performed using the standard protocol during bolus administration of intravenous contrast. Multiplanar CT image reconstructions and MIPs were obtained to evaluate the vascular anatomy. CONTRAST:  122mL OMNIPAQUE IOHEXOL 350 MG/ML SOLN COMPARISON:  None. FINDINGS: Cardiovascular: The heart size is normal. No substantial pericardial effusion. Coronary artery calcification is evident. Atherosclerotic calcification is noted in the wall of the thoracic aorta.There is no filling defect within the opacified pulmonary arteries to suggest the presence of an acute pulmonary embolus. Mediastinum/Nodes: No mediastinal lymphadenopathy. No right hilar lymphadenopathy. 12 mm short axis left hilar lymph node evident. The esophagus has normal imaging features. There is no axillary lymphadenopathy. Lungs/Pleura: Patchy bilateral ground-glass airspace opacity is seen in the lungs bilaterally, left greater than right. More confluent airspace consolidation is noted in the left lower  lobe. No pleural effusion. Upper Abdomen: 17 mm hypoattenuating lesion identified posterior right liver, incompletely characterized. This was not visible on noncontrast CT abdomen/pelvis of 04/07/2011. Musculoskeletal: No worrisome lytic or sclerotic osseous abnormality. Review of the MIP images confirms the above findings. IMPRESSION: 1. No CT evidence for acute pulmonary embolus. 2. Patchy bilateral ground-glass airspace opacity, left greater than right, with confluent airspace consolidation in the left lower lobe. Imaging features compatible with multifocal pneumonia. Asymmetric edema or pulmonary hemorrhage considered less likely. 3. Mild left hilar lymphadenopathy, likely reactive. Follow-up CT in 3 months recommended to ensure resolution. 4. 17 mm hypoattenuating lesion posterior right liver, incompletely characterized. This was not visible on noncontrast CT abdomen/pelvis of 04/07/2011. Follow-up MRI without and with contrast could be used to further characterize as clinically warranted. 5. Aortic Atherosclerosis (ICD10-I70.0). Electronically Signed   By: Misty Stanley M.D.   On: 10/15/2019 12:52    ____________________________________________   PROCEDURES and INTERVENTIONS  Procedure(s) performed (including Critical Care):  Procedures  Medications  iohexol (OMNIPAQUE) 350 MG/ML injection 100 mL (100 mLs  Intravenous Contrast Given 10/15/19 1216)    ____________________________________________   INITIAL IMPRESSION / ASSESSMENT AND PLAN / ED COURSE  Pleasant 69 year old man presenting from home with hemoptysis, most consistent with acute multifocal bacterial pneumonia, and amenable to trial of outpatient management with PCP follow-up.  Normal vital signs and room air without tachycardia, hypoxia or fever.  Reassuring exam with well-appearing patient who is pleasant and joking, no evidence of respiratory distress, ongoing bleeding or significant acute pathology.  Blood work with hyperglycemia  without acidosis, otherwise unremarkable.  Known anemia, for which he receives iron infusions.  While CXR shows a possibility of an LLL infiltrate, the possibility of an acute PE causing his hemoptysis is most concerning to me.  In discussion with the patient, he is agreeable to CTA chest, which effectively rules out acute PE.  Does better demonstrate evidence of multifocal community-acquired pneumonia.  Due to patient's well-appearing status and being well connected with his PCP, I believe is reasonable to attempt a trial of outpatient management.  Prescribed Augmentin and doxycycline for treatment.  We thoroughly discussed outpatient management, including following up with his PCP within the next 2-3 days, and return precautions for the ED were discussed.  Patient expresses understanding agreement.  Medical stable for discharge home.  Clinical Course as of Oct 14 1516  Nancy Fetter Oct 15, 2019  1328 Reassessed.  Patient ports feeling well.  Educated patient on no evidence of acute PE, but with signs of pneumonia.  We discussed outpatient management with following up with his PCP within the next 2-3 days.  He is agreeable.   [DS]    Clinical Course User Index [DS] Vladimir Crofts, MD     ____________________________________________   FINAL CLINICAL IMPRESSION(S) / ED DIAGNOSES  Final diagnoses:  Multifocal pneumonia  Hemoptysis  Hyperglycemia     ED Discharge Orders         Ordered    amoxicillin-clavulanate (AUGMENTIN) 875-125 MG tablet  Every 12 hours     Discontinue  Reprint     10/15/19 1334    doxycycline (VIBRA-TABS) 100 MG tablet  2 times daily     Discontinue  Reprint     10/15/19 1334           Jemima Petko Tamala Julian   Note:  This document was prepared using Dragon voice recognition software and may include unintentional dictation errors.   Vladimir Crofts, MD 10/15/19 [phone]

## 2019-10-15 NOTE — Discharge Instructions (Signed)
You were seen in the ED because of your coughing up blood.  You have evidence of pneumonia, and thankfully no signs of blood clot in the lungs.  Because of how well you feel, we are going to try outpatient management to start.  You are being discharged with 2 antibiotics, Augmentin and doxycycline.  Each of these medications will be waiting for the [facility] here in town. Both of these medications you will take twice daily for the next 7 days.  It is safe to take them together.   Please follow-up with your primary care physician within the next 2-3 days to ensure that you are improving. Use Tylenol for any fevers that you may have.  Continue her other medications.  If you develop any worsening symptoms, continued bleeding, difficulty breathing, please return to the ED.

## 2019-10-19 ENCOUNTER — Telehealth: Payer: Self-pay | Admitting: *Deleted

## 2019-10-19 NOTE — Telephone Encounter (Signed)
Patient called reporting that he went to ER and was diagnosed with pneumonia and that he has been coughing up blood. He is being managed for his pneumonia as an outpatient and PCP follow up per ER note. He states that he is not feeling well (weak) and would like to move his September appointment up to early next week with Dr Grayland Ormond. Please advise

## 2019-10-19 NOTE — Telephone Encounter (Signed)
That's fine.  We can check labs the day before.  Although I suspect he will not require any additional IV iron.

## 2019-10-24 ENCOUNTER — Inpatient Hospital Stay: Payer: Medicare HMO | Attending: Oncology

## 2019-10-24 ENCOUNTER — Other Ambulatory Visit: Payer: Self-pay

## 2019-10-24 DIAGNOSIS — D509 Iron deficiency anemia, unspecified: Secondary | ICD-10-CM | POA: Insufficient documentation

## 2019-10-24 LAB — CBC WITH DIFFERENTIAL/PLATELET
Abs Immature Granulocytes: 0.05 10*3/uL (ref 0.00–0.07)
Basophils Absolute: 0.1 10*3/uL (ref 0.0–0.1)
Basophils Relative: 1 %
Eosinophils Absolute: 0.2 10*3/uL (ref 0.0–0.5)
Eosinophils Relative: 2 %
HCT: 32.1 % — ABNORMAL LOW (ref 39.0–52.0)
Hemoglobin: 10.6 g/dL — ABNORMAL LOW (ref 13.0–17.0)
Immature Granulocytes: 1 %
Lymphocytes Relative: 11 %
Lymphs Abs: 0.9 10*3/uL (ref 0.7–4.0)
MCH: 30.3 pg (ref 26.0–34.0)
MCHC: 33 g/dL (ref 30.0–36.0)
MCV: 91.7 fL (ref 80.0–100.0)
Monocytes Absolute: 0.4 10*3/uL (ref 0.1–1.0)
Monocytes Relative: 5 %
Neutro Abs: 6.8 10*3/uL (ref 1.7–7.7)
Neutrophils Relative %: 80 %
Platelets: 193 10*3/uL (ref 150–400)
RBC: 3.5 MIL/uL — ABNORMAL LOW (ref 4.22–5.81)
RDW: 16.8 % — ABNORMAL HIGH (ref 11.5–15.5)
WBC: 8.4 10*3/uL (ref 4.0–10.5)
nRBC: 0 % (ref 0.0–0.2)

## 2019-10-24 LAB — IRON AND TIBC
Iron: 41 ug/dL — ABNORMAL LOW (ref 45–182)
Saturation Ratios: 14 % — ABNORMAL LOW (ref 17.9–39.5)
TIBC: 301 ug/dL (ref 250–450)
UIBC: 260 ug/dL

## 2019-10-24 LAB — FERRITIN: Ferritin: 118 ng/mL (ref 24–336)

## 2019-10-25 ENCOUNTER — Inpatient Hospital Stay: Payer: Medicare HMO

## 2019-10-25 ENCOUNTER — Encounter: Payer: Self-pay | Admitting: Oncology

## 2019-10-25 ENCOUNTER — Inpatient Hospital Stay (HOSPITAL_BASED_OUTPATIENT_CLINIC_OR_DEPARTMENT_OTHER): Payer: Medicare HMO | Admitting: Oncology

## 2019-10-25 ENCOUNTER — Other Ambulatory Visit: Admission: RE | Admit: 2019-10-25 | Payer: Medicare HMO | Source: Ambulatory Visit

## 2019-10-25 VITALS — BP 120/59 | HR 86 | Resp 16

## 2019-10-25 VITALS — BP 152/63 | HR 99 | Temp 98.9°F | Resp 16 | Wt 283.6 lb

## 2019-10-25 DIAGNOSIS — D509 Iron deficiency anemia, unspecified: Secondary | ICD-10-CM

## 2019-10-25 MED ORDER — SODIUM CHLORIDE 0.9 % IV SOLN
Freq: Once | INTRAVENOUS | Status: AC
  Filled 2019-10-25: qty 250

## 2019-10-25 MED ORDER — SODIUM CHLORIDE 0.9 % IV SOLN
510.0000 mg | Freq: Once | INTRAVENOUS | Status: AC
  Administered 2019-10-25: 510 mg via INTRAVENOUS
  Filled 2019-10-25: qty 510

## 2019-10-25 NOTE — Progress Notes (Signed)
Patient here for oncology follow-up appointment, states he is recovering from recent pneumonia, expresses complaints of still being short of breath.

## 2019-10-25 NOTE — Progress Notes (Signed)
[place]  [phone] Fax:(336) [phone]  ID: [name] OB: [birth year]  MR#: [id number]  CSN#:[id number]  Patient Care Team: Juluis Pitch, MD as PCP - General (Family Medicine) Efrain Sella, MD as Consulting Physician (Gastroenterology) Lloyd Huger, MD as Consulting Physician (Oncology)  CHIEF COMPLAINT: Iron deficiency anemia.  INTERVAL HISTORY: Patient returns to clinic today as an add-on after being evaluated in the emergency room for pneumonia and complaining of increasing weakness and fatigue.  He has now completed his antibiotic course, but still has not recovered back to his baseline.  He has no neurologic complaints.  He denies any fevers.  He has a good appetite and denies weight loss.  He has no chest pain, shortness of breath, cough, or hemoptysis.  He denies any nausea, vomiting, constipation, or diarrhea.  He has no melena or hematochezia.  He has no urinary complaints.  Patient offers no further specific complaints today.  REVIEW OF SYSTEMS:   Review of Systems  Constitutional: Positive for malaise/fatigue. Negative for fever and weight loss.  Respiratory: Negative.  Negative for cough, hemoptysis and shortness of breath.   Cardiovascular: Negative.  Negative for chest pain and leg swelling.  Gastrointestinal: Negative.  Negative for abdominal pain, blood in stool and melena.  Genitourinary: Negative.  Negative for hematuria.  Musculoskeletal: Negative.  Negative for back pain.  Skin: Negative.  Negative for rash.  Neurological: Positive for weakness. Negative for dizziness, focal weakness and headaches.  Psychiatric/Behavioral: Negative.  The patient is not nervous/anxious.     As per HPI. Otherwise, a complete review of systems is negative.  PAST MEDICAL HISTORY: Past Medical History:  Diagnosis Date  . Arthritis   . CHF (congestive heart failure), NYHA class II, acute on chronic, systolic (HCC)    EF 44%  07/2018  . Coronary artery disease    [facility]  . Diabetes mellitus without complication ([place])   . Hyperlipidemia   . Hypertension   . Numbness in right leg    due to back  . Skin cancer    removed l arm  . Spinal stenosis   . Stented coronary artery 2000   X 1 STENT  . Swelling of both lower extremities     PAST SURGICAL HISTORY: Past Surgical History:  Procedure Laterality Date  . CATARACT EXTRACTION W/PHACO Right 05/03/2019   Procedure: CATARACT EXTRACTION PHACO AND INTRAOCULAR LENS PLACEMENT (IOC) RIGHT DIABETIC 6.44 00:58.4 11.0%;  Surgeon: Leandrew Koyanagi, MD;  Location: [facility];  Service: Ophthalmology;  Laterality: Right;  DIABETIC  . CATARACT EXTRACTION W/PHACO Left 06/28/2019   Procedure: CATARACT EXTRACTION PHACO AND INTRAOCULAR LENS PLACEMENT (IOC) LEFT DIABETIC 7.99  01:13.4  10.9% ;  Surgeon: Leandrew Koyanagi, MD;  Location: [facility];  Service: Ophthalmology;  Laterality: Left;  Diabetic - insulin and oral meds  . CHOLECYSTECTOMY    . JOINT REPLACEMENT     RT KNEE  . LUMBAR LAMINECTOMY/DECOMPRESSION MICRODISCECTOMY  04/13/2012   Procedure: LUMBAR LAMINECTOMY/DECOMPRESSION MICRODISCECTOMY 1 LEVEL;  Surgeon: Johnn Hai, MD;  Location: [facility];  Service: Orthopedics;  Laterality: Bilateral;  L4-L5  . UVULECTOMY  1990's    FAMILY HISTORY: History reviewed. No pertinent family history.  ADVANCED DIRECTIVES (Y/N):  N  HEALTH MAINTENANCE: Social History   Tobacco Use  . Smoking status: Never Smoker  . Smokeless tobacco: Never Used  Vaping Use  . Vaping Use: Never used  Substance Use Topics  . Alcohol use: Yes  Comment: rare  . Drug use: No     Colonoscopy:  PAP:  Bone density:  Lipid panel:  Allergies  Allergen Reactions  . Metoprolol Other (See Comments)    Cant remember  . Toprol Xl [Metoprolol Tartrate] Other (See Comments)    Cant remember  . Buprenorphine Hcl Itching and Rash  . Morphine  Itching and Other (See Comments)  . Morphine And Related Itching and Rash  . Triamcinolone Acetonide Rash    Current Outpatient Medications  Medication Sig Dispense Refill  . amLODipine (NORVASC) 5 MG tablet Take 5 mg by mouth daily.    Marland Kitchen aspirin 325 MG tablet Take 1 tablet (325 mg total) by mouth daily.    Marland Kitchen atorvastatin (LIPITOR) 40 MG tablet Take 80 mg by mouth every evening.     Marland Kitchen atorvastatin (LIPITOR) 80 MG tablet Take 80 mg by mouth daily.    . FEROSUL 325 (65 Fe) MG tablet Take 325 mg by mouth 2 (two) times daily.    . ferrous sulfate 325 (65 FE) MG EC tablet Take 1 tablet (325 mg total) by mouth 2 (two) times daily. 60 tablet 3  . furosemide (LASIX) 20 MG tablet Take 20 mg by mouth.    . furosemide (LASIX) 20 MG tablet Take by mouth.    Marland Kitchen glipiZIDE (GLUCOTROL XL) 5 MG 24 hr tablet Take 5 mg by mouth 2 (two) times daily.    . insulin aspart (NOVOLOG) 100 UNIT/ML injection Inject into the skin 3 (three) times daily before meals.    . insulin glargine (LANTUS) 100 UNIT/ML injection Inject 44 Units into the skin daily.    . isosorbide mononitrate (IMDUR) 30 MG 24 hr tablet Take by mouth.    Marland Kitchen LANTUS SOLOSTAR 100 UNIT/ML Solostar Pen SMARTSIG:44 Unit(s) SUB-Q Every Night    . losartan (COZAAR) 100 MG tablet Take by mouth.    . losartan (COZAAR) 50 MG tablet Take 100 mg by mouth every evening.     . metFORMIN (GLUCOPHAGE) 1000 MG tablet Take 1,000 mg by mouth 2 (two) times daily with a meal.    . naproxen sodium (ANAPROX) 220 MG tablet Take 220 mg by mouth 2 (two) times daily as needed.      No current facility-administered medications for this visit.    OBJECTIVE: Vitals:   10/25/19 1055  BP: (!) 152/63  Pulse: 99  Resp: 16  Temp: 98.9 F (37.2 C)  SpO2: 100%     Body mass index is 38.46 kg/m.    ECOG FS:0 - Asymptomatic  General: Well-developed, well-nourished, no acute distress. Eyes: Pink conjunctiva, anicteric sclera. HEENT: Normocephalic, moist mucous  membranes. Lungs: No audible wheezing or coughing. Heart: Regular rate and rhythm. Abdomen: Soft, nontender, no obvious distention. Musculoskeletal: No edema, cyanosis, or clubbing. Neuro: Alert, answering all questions appropriately. Cranial nerves grossly intact. Skin: No rashes or petechiae noted. Psych: Normal affect.    LAB RESULTS:  Lab Results  Component Value Date   NA 136 10/15/2019   K 4.6 10/15/2019   CL 104 10/15/2019   CO2 22 10/15/2019   GLUCOSE 380 (H) 10/15/2019   BUN 27 (H) 10/15/2019   CREATININE 1.04 10/15/2019   CALCIUM 8.7 (L) 10/15/2019   PROT 6.5 05/16/2019   ALBUMIN 3.6 05/16/2019   AST 14 (L) 05/16/2019   ALT 17 05/16/2019   ALKPHOS 80 05/16/2019   BILITOT 1.2 05/16/2019   GFRNONAA >60 10/15/2019   GFRAA >60 10/15/2019    Lab Results  Component Value Date   WBC 8.4 10/24/2019   NEUTROABS 6.8 10/24/2019   HGB 10.6 (L) 10/24/2019   HCT 32.1 (L) 10/24/2019   MCV 91.7 10/24/2019   PLT 193 10/24/2019   Lab Results  Component Value Date   IRON 41 (L) 10/24/2019   TIBC 301 10/24/2019   IRONPCTSAT 14 (L) 10/24/2019   Lab Results  Component Value Date   FERRITIN 118 10/24/2019     STUDIES: DG Chest 2 View  Result Date: 10/15/2019 CLINICAL DATA:  Hemoptysis. EXAM: CHEST - 2 VIEW COMPARISON:  Chest x-ray dated 04/06/2012 FINDINGS: Heart size and mediastinal contours are within normal limits. Hazy opacities within the LEFT lower lobe. Lungs otherwise clear. No pneumothorax is seen. IMPRESSION: Hazy opacities within the LEFT lower lobe, suspicious for pneumonia. Electronically Signed   By: Franki Cabot M.D.   On: 10/15/2019 11:20   CT Angio Chest PE W and/or Wo Contrast  Result Date: 10/15/2019 CLINICAL DATA:  Hemoptysis.  Technique cardia.  PE suspected. EXAM: CT ANGIOGRAPHY CHEST WITH CONTRAST TECHNIQUE: Multidetector CT imaging of the chest was performed using the standard protocol during bolus administration of intravenous contrast.  Multiplanar CT image reconstructions and MIPs were obtained to evaluate the vascular anatomy. CONTRAST:  121mL OMNIPAQUE IOHEXOL 350 MG/ML SOLN COMPARISON:  None. FINDINGS: Cardiovascular: The heart size is normal. No substantial pericardial effusion. Coronary artery calcification is evident. Atherosclerotic calcification is noted in the wall of the thoracic aorta.There is no filling defect within the opacified pulmonary arteries to suggest the presence of an acute pulmonary embolus. Mediastinum/Nodes: No mediastinal lymphadenopathy. No right hilar lymphadenopathy. 12 mm short axis left hilar lymph node evident. The esophagus has normal imaging features. There is no axillary lymphadenopathy. Lungs/Pleura: Patchy bilateral ground-glass airspace opacity is seen in the lungs bilaterally, left greater than right. More confluent airspace consolidation is noted in the left lower lobe. No pleural effusion. Upper Abdomen: 17 mm hypoattenuating lesion identified posterior right liver, incompletely characterized. This was not visible on noncontrast CT abdomen/pelvis of 04/07/2011. Musculoskeletal: No worrisome lytic or sclerotic osseous abnormality. Review of the MIP images confirms the above findings. IMPRESSION: 1. No CT evidence for acute pulmonary embolus. 2. Patchy bilateral ground-glass airspace opacity, left greater than right, with confluent airspace consolidation in the left lower lobe. Imaging features compatible with multifocal pneumonia. Asymmetric edema or pulmonary hemorrhage considered less likely. 3. Mild left hilar lymphadenopathy, likely reactive. Follow-up CT in 3 months recommended to ensure resolution. 4. 17 mm hypoattenuating lesion posterior right liver, incompletely characterized. This was not visible on noncontrast CT abdomen/pelvis of 04/07/2011. Follow-up MRI without and with contrast could be used to further characterize as clinically warranted. 5. Aortic Atherosclerosis (ICD10-I70.0).  Electronically Signed   By: Misty Stanley M.D.   On: 10/15/2019 12:52    ASSESSMENT: Iron deficiency anemia.  PLAN:   1.  Iron deficiency anemia: Patient's hemoglobin remains decreased, but essentially unchanged at 10.6.  Despite this, his iron stores have trended down and he is symptomatic.  Proceed with an additional 510 mg IV Feraheme today.  He does not require a second infusion.  Patient had an appointment with GI, but is unclear if this has occurred yet.  No further intervention is needed.  Return to clinic in 3 months with repeat laboratory work, further evaluation, and continuation of treatment if needed. 2.  Pneumonia: Patient is now completed his course of antibiotics.  Follow-up with primary care as needed.    I spent a total of  30 minutes reviewing chart data, face-to-face evaluation with the patient, counseling and coordination of care as detailed above.    Patient expressed understanding and was in agreement with this plan. He also understands that He can call clinic at any time with any questions, concerns, or complaints.    Lloyd Huger, MD   10/25/2019 11:58 AM

## 2019-10-25 NOTE — Progress Notes (Signed)
Patient tolerated infusion well. Denies any questions or concerns. Discharged home.

## 2019-10-27 ENCOUNTER — Ambulatory Visit: Admission: RE | Admit: 2019-10-27 | Payer: Medicare HMO | Source: Home / Self Care | Admitting: General Surgery

## 2019-10-27 ENCOUNTER — Encounter: Admission: RE | Payer: Self-pay | Source: Home / Self Care

## 2019-10-27 SURGERY — COLONOSCOPY WITH PROPOFOL
Anesthesia: General

## 2019-11-08 DIAGNOSIS — E113293 Type 2 diabetes mellitus with mild nonproliferative diabetic retinopathy without macular edema, bilateral: Secondary | ICD-10-CM | POA: Diagnosis not present

## 2019-11-08 DIAGNOSIS — Z794 Long term (current) use of insulin: Secondary | ICD-10-CM | POA: Diagnosis not present

## 2019-11-15 DIAGNOSIS — E113293 Type 2 diabetes mellitus with mild nonproliferative diabetic retinopathy without macular edema, bilateral: Secondary | ICD-10-CM | POA: Diagnosis not present

## 2019-11-15 DIAGNOSIS — Z794 Long term (current) use of insulin: Secondary | ICD-10-CM | POA: Diagnosis not present

## 2019-11-15 DIAGNOSIS — E1169 Type 2 diabetes mellitus with other specified complication: Secondary | ICD-10-CM | POA: Diagnosis not present

## 2019-11-15 DIAGNOSIS — E1159 Type 2 diabetes mellitus with other circulatory complications: Secondary | ICD-10-CM | POA: Diagnosis not present

## 2019-11-15 DIAGNOSIS — Z7189 Other specified counseling: Secondary | ICD-10-CM | POA: Diagnosis not present

## 2019-11-15 DIAGNOSIS — E1129 Type 2 diabetes mellitus with other diabetic kidney complication: Secondary | ICD-10-CM | POA: Diagnosis not present

## 2019-11-15 DIAGNOSIS — I10 Essential (primary) hypertension: Secondary | ICD-10-CM | POA: Diagnosis not present

## 2019-11-15 DIAGNOSIS — E1142 Type 2 diabetes mellitus with diabetic polyneuropathy: Secondary | ICD-10-CM | POA: Diagnosis not present

## 2019-11-15 DIAGNOSIS — R809 Proteinuria, unspecified: Secondary | ICD-10-CM | POA: Diagnosis not present

## 2019-11-27 ENCOUNTER — Other Ambulatory Visit: Payer: Medicare HMO

## 2019-11-28 ENCOUNTER — Ambulatory Visit: Payer: Medicare HMO | Admitting: Oncology

## 2019-11-28 ENCOUNTER — Ambulatory Visit: Payer: Medicare HMO

## 2019-12-05 DIAGNOSIS — E1165 Type 2 diabetes mellitus with hyperglycemia: Secondary | ICD-10-CM | POA: Diagnosis not present

## 2019-12-12 DIAGNOSIS — I10 Essential (primary) hypertension: Secondary | ICD-10-CM | POA: Diagnosis not present

## 2019-12-12 DIAGNOSIS — Z23 Encounter for immunization: Secondary | ICD-10-CM | POA: Diagnosis not present

## 2019-12-12 DIAGNOSIS — I700 Atherosclerosis of aorta: Secondary | ICD-10-CM | POA: Diagnosis not present

## 2019-12-12 DIAGNOSIS — I5022 Chronic systolic (congestive) heart failure: Secondary | ICD-10-CM | POA: Diagnosis not present

## 2019-12-12 DIAGNOSIS — I2510 Atherosclerotic heart disease of native coronary artery without angina pectoris: Secondary | ICD-10-CM | POA: Diagnosis not present

## 2019-12-12 DIAGNOSIS — E1159 Type 2 diabetes mellitus with other circulatory complications: Secondary | ICD-10-CM | POA: Diagnosis not present

## 2019-12-12 DIAGNOSIS — Z89422 Acquired absence of other left toe(s): Secondary | ICD-10-CM | POA: Diagnosis not present

## 2019-12-12 DIAGNOSIS — E782 Mixed hyperlipidemia: Secondary | ICD-10-CM | POA: Diagnosis not present

## 2019-12-12 DIAGNOSIS — I6523 Occlusion and stenosis of bilateral carotid arteries: Secondary | ICD-10-CM | POA: Diagnosis not present

## 2019-12-14 ENCOUNTER — Other Ambulatory Visit: Payer: Self-pay | Admitting: *Deleted

## 2019-12-14 MED ORDER — FEROSUL 325 (65 FE) MG PO TABS: 325.0000 mg | ORAL_TABLET | Freq: Two times a day (BID) | ORAL | 1 refills | Status: DC

## 2019-12-18 ENCOUNTER — Other Ambulatory Visit: Payer: Self-pay | Admitting: *Deleted

## 2019-12-18 NOTE — Telephone Encounter (Signed)
This was refill last week

## 2020-01-21 NOTE — Progress Notes (Signed)
[place]  Telephone:(336) [phone] Fax:(336) [phone]  ID: [name] OB: 09-01-1950  MR#: [id number]  CSN#:[id number]  Patient Care Team: Juluis Pitch, MD as PCP - General (Family Medicine) Efrain Sella, MD as Consulting Physician (Gastroenterology) Lloyd Huger, MD as Consulting Physician (Oncology)  CHIEF COMPLAINT: Iron deficiency anemia.  INTERVAL HISTORY: Patient returns to clinic today for further evaluation and consideration of additional Feraheme. He currently feels well and is asymptomatic.  He denies any further weakness or fatigue.  He has no neurologic complaints.  He denies any fevers.  He has a good appetite and denies weight loss.  He has no chest pain, shortness of breath, cough, or hemoptysis.  He denies any nausea, vomiting, constipation, or diarrhea.  He has no melena or hematochezia.  He has no urinary complaints.  Patient offers no specific complaints today.  REVIEW OF SYSTEMS:   Review of Systems  Constitutional: Negative.  Negative for fever, malaise/fatigue and weight loss.  Respiratory: Negative.  Negative for cough, hemoptysis and shortness of breath.   Cardiovascular: Negative.  Negative for chest pain and leg swelling.  Gastrointestinal: Negative.  Negative for abdominal pain, blood in stool and melena.  Genitourinary: Negative.  Negative for hematuria.  Musculoskeletal: Negative.  Negative for back pain.  Skin: Negative.  Negative for rash.  Neurological: Negative.  Negative for dizziness, focal weakness, weakness and headaches.  Psychiatric/Behavioral: Negative.  The patient is not nervous/anxious.     As per HPI. Otherwise, a complete review of systems is negative.  PAST MEDICAL HISTORY: Past Medical History:  Diagnosis Date  . Arthritis   . CHF (congestive heart failure), NYHA class II, acute on chronic, systolic (HCC)    EF 42% 10/7679  . Coronary artery disease    [facility]  . Diabetes  mellitus without complication ([place])   . Hyperlipidemia   . Hypertension   . Numbness in right leg    due to back  . Skin cancer    removed l arm  . Spinal stenosis   . Stented coronary artery 2000   X 1 STENT  . Swelling of both lower extremities     PAST SURGICAL HISTORY: Past Surgical History:  Procedure Laterality Date  . CATARACT EXTRACTION W/PHACO Right 05/03/2019   Procedure: CATARACT EXTRACTION PHACO AND INTRAOCULAR LENS PLACEMENT (IOC) RIGHT DIABETIC 6.44 00:58.4 11.0%;  Surgeon: Leandrew Koyanagi, MD;  Location: [facility];  Service: Ophthalmology;  Laterality: Right;  DIABETIC  . CATARACT EXTRACTION W/PHACO Left 06/28/2019   Procedure: CATARACT EXTRACTION PHACO AND INTRAOCULAR LENS PLACEMENT (IOC) LEFT DIABETIC 7.99  01:13.4  10.9% ;  Surgeon: Leandrew Koyanagi, MD;  Location: [facility];  Service: Ophthalmology;  Laterality: Left;  Diabetic - insulin and oral meds  . CHOLECYSTECTOMY    . JOINT REPLACEMENT     RT KNEE  . LUMBAR LAMINECTOMY/DECOMPRESSION MICRODISCECTOMY  04/13/2012   Procedure: LUMBAR LAMINECTOMY/DECOMPRESSION MICRODISCECTOMY 1 LEVEL;  Surgeon: Johnn Hai, MD;  Location: [facility];  Service: Orthopedics;  Laterality: Bilateral;  L4-L5  . UVULECTOMY  1990's    FAMILY HISTORY: History reviewed. No pertinent family history.  ADVANCED DIRECTIVES (Y/N):  N  HEALTH MAINTENANCE: Social History   Tobacco Use  . Smoking status: Never Smoker  . Smokeless tobacco: Never Used  Vaping Use  . Vaping Use: Never used  Substance Use Topics  . Alcohol use: Yes    Comment: rare  . Drug use: No     Colonoscopy:  PAP:  Bone density:  Lipid panel:  Allergies  Allergen Reactions  . Metoprolol Other (See Comments)    Cant remember  . Toprol Xl [Metoprolol Tartrate] Other (See Comments)    Cant remember  . Buprenorphine Hcl Itching and Rash  . Morphine Itching and Other (See Comments)  . Morphine And Related Itching and Rash  .  Triamcinolone Acetonide Rash    Current Outpatient Medications  Medication Sig Dispense Refill  . amLODipine (NORVASC) 5 MG tablet Take 5 mg by mouth daily.    Marland Kitchen aspirin 325 MG tablet Take 1 tablet (325 mg total) by mouth daily.    Marland Kitchen atorvastatin (LIPITOR) 40 MG tablet Take 80 mg by mouth every evening.     Marland Kitchen atorvastatin (LIPITOR) 80 MG tablet Take 80 mg by mouth daily.    . Continuous Blood Gluc Receiver (DEXCOM G6 RECEIVER) DEVI Use to monitor blood sugar. [place] [phone]    . Continuous Blood Gluc Sensor (DEXCOM G6 SENSOR) MISC Use to monitor blood sugar.  Replace every 10 days. [place] [phone].    Marland Kitchen Continuous Blood Gluc Transmit (DEXCOM G6 TRANSMITTER) MISC Use to monitor blood sugar.  Replace every 3 months. [place] [phone].    . dapagliflozin propanediol (FARXIGA) 10 MG TABS tablet Take by mouth.    . FEROSUL 325 (65 Fe) MG tablet Take 1 tablet (325 mg total) by mouth 2 (two) times daily. 180 tablet 1  . furosemide (LASIX) 20 MG tablet Take 20 mg by mouth.    . furosemide (LASIX) 20 MG tablet Take by mouth.    Marland Kitchen glipiZIDE (GLUCOTROL XL) 5 MG 24 hr tablet Take 5 mg by mouth 2 (two) times daily.    . insulin aspart (NOVOLOG) 100 UNIT/ML injection Inject into the skin 3 (three) times daily before meals.    . insulin glargine (LANTUS) 100 UNIT/ML injection Inject 44 Units into the skin daily.    . isosorbide mononitrate (IMDUR) 30 MG 24 hr tablet Take by mouth.    Marland Kitchen LANTUS SOLOSTAR 100 UNIT/ML Solostar Pen SMARTSIG:44 Unit(s) SUB-Q Every Night    . losartan (COZAAR) 100 MG tablet Take by mouth.    . losartan (COZAAR) 50 MG tablet Take 100 mg by mouth every evening.     . metFORMIN (GLUCOPHAGE) 1000 MG tablet Take 1,000 mg by mouth 2 (two) times daily with a meal.    . naproxen sodium (ANAPROX) 220 MG tablet Take 220 mg by mouth 2 (two) times daily as needed.      No current facility-administered medications for this visit.    OBJECTIVE: Vitals:   01/26/20 1315  BP: (!)  151/56  Pulse: 89  Resp: 16  Temp: 98.4 F (36.9 C)  SpO2: 98%     Body mass index is 39.09 kg/m.    ECOG FS:0 - Asymptomatic  General: Well-developed, well-nourished, no acute distress. Eyes: Pink conjunctiva, anicteric sclera. HEENT: Normocephalic, moist mucous membranes. Lungs: No audible wheezing or coughing. Heart: Regular rate and rhythm. Abdomen: Soft, nontender, no obvious distention. Musculoskeletal: No edema, cyanosis, or clubbing. Neuro: Alert, answering all questions appropriately. Cranial nerves grossly intact. Skin: No rashes or petechiae noted. Psych: Normal affect.   LAB RESULTS:  Lab Results  Component Value Date   NA 136 10/15/2019   K 4.6 10/15/2019   CL 104 10/15/2019   CO2 22 10/15/2019   GLUCOSE 380 (H) 10/15/2019   BUN 27 (H) 10/15/2019   CREATININE 1.04 10/15/2019   CALCIUM 8.7 (L) 10/15/2019  PROT 6.5 05/16/2019   ALBUMIN 3.6 05/16/2019   AST 14 (L) 05/16/2019   ALT 17 05/16/2019   ALKPHOS 80 05/16/2019   BILITOT 1.2 05/16/2019   GFRNONAA >60 10/15/2019   GFRAA >60 10/15/2019    Lab Results  Component Value Date   WBC 8.8 01/25/2020   NEUTROABS 7.3 01/25/2020   HGB 10.6 (L) 01/25/2020   HCT 31.6 (L) 01/25/2020   MCV 90.8 01/25/2020   PLT 203 01/25/2020   Lab Results  Component Value Date   IRON 82 01/25/2020   TIBC 305 01/25/2020   IRONPCTSAT 27 01/25/2020   Lab Results  Component Value Date   FERRITIN 96 01/25/2020     STUDIES: No results found.  ASSESSMENT: Iron deficiency anemia.  PLAN:   1.  Iron deficiency anemia: Patient's hemoglobin remains decreased, but essentially unchanged at 10.6. His iron stores are now within normal limits. He does not require additional Feraheme today.  He has not seen GI yet and a referral was resent today. No further intervention is needed.  Return to clinic in 3 months for further evaluation and consideration of additional treatment.  2.  Pneumonia: Resolved.  I spent a total of 20  minutes reviewing chart data, face-to-face evaluation with the patient, counseling and coordination of care as detailed above.  Patient expressed understanding and was in agreement with this plan. He also understands that He can call clinic at any time with any questions, concerns, or complaints.    Lloyd Huger, MD   01/28/2020 10:20 AM

## 2020-01-25 ENCOUNTER — Other Ambulatory Visit: Payer: Self-pay

## 2020-01-25 ENCOUNTER — Inpatient Hospital Stay: Payer: Medicare HMO | Attending: Oncology

## 2020-01-25 DIAGNOSIS — D509 Iron deficiency anemia, unspecified: Secondary | ICD-10-CM | POA: Insufficient documentation

## 2020-01-25 LAB — CBC WITH DIFFERENTIAL/PLATELET
Abs Immature Granulocytes: 0.04 10*3/uL (ref 0.00–0.07)
Basophils Absolute: 0 10*3/uL (ref 0.0–0.1)
Basophils Relative: 1 %
Eosinophils Absolute: 0.1 10*3/uL (ref 0.0–0.5)
Eosinophils Relative: 1 %
HCT: 31.6 % — ABNORMAL LOW (ref 39.0–52.0)
Hemoglobin: 10.6 g/dL — ABNORMAL LOW (ref 13.0–17.0)
Immature Granulocytes: 1 %
Lymphocytes Relative: 10 %
Lymphs Abs: 0.9 10*3/uL (ref 0.7–4.0)
MCH: 30.5 pg (ref 26.0–34.0)
MCHC: 33.5 g/dL (ref 30.0–36.0)
MCV: 90.8 fL (ref 80.0–100.0)
Monocytes Absolute: 0.4 10*3/uL (ref 0.1–1.0)
Monocytes Relative: 5 %
Neutro Abs: 7.3 10*3/uL (ref 1.7–7.7)
Neutrophils Relative %: 82 %
Platelets: 203 10*3/uL (ref 150–400)
RBC: 3.48 MIL/uL — ABNORMAL LOW (ref 4.22–5.81)
RDW: 15.6 % — ABNORMAL HIGH (ref 11.5–15.5)
WBC: 8.8 10*3/uL (ref 4.0–10.5)
nRBC: 0 % (ref 0.0–0.2)

## 2020-01-25 LAB — IRON AND TIBC
Iron: 82 ug/dL (ref 45–182)
Saturation Ratios: 27 % (ref 17.9–39.5)
TIBC: 305 ug/dL (ref 250–450)
UIBC: 223 ug/dL

## 2020-01-25 LAB — FERRITIN: Ferritin: 96 ng/mL (ref 24–336)

## 2020-01-26 ENCOUNTER — Encounter: Payer: Self-pay | Admitting: Oncology

## 2020-01-26 ENCOUNTER — Inpatient Hospital Stay: Payer: Medicare HMO | Admitting: Oncology

## 2020-01-26 ENCOUNTER — Inpatient Hospital Stay: Payer: Medicare HMO

## 2020-01-26 VITALS — BP 151/56 | HR 89 | Temp 98.4°F | Resp 16 | Wt 288.2 lb

## 2020-01-26 DIAGNOSIS — D509 Iron deficiency anemia, unspecified: Secondary | ICD-10-CM

## 2020-01-26 MED ORDER — FEROSUL 325 (65 FE) MG PO TABS: 325.0000 mg | ORAL_TABLET | Freq: Two times a day (BID) | ORAL | 1 refills | Status: DC

## 2020-02-06 DIAGNOSIS — E113293 Type 2 diabetes mellitus with mild nonproliferative diabetic retinopathy without macular edema, bilateral: Secondary | ICD-10-CM | POA: Diagnosis not present

## 2020-02-13 DIAGNOSIS — M545 Low back pain, unspecified: Secondary | ICD-10-CM | POA: Diagnosis not present

## 2020-02-13 DIAGNOSIS — R300 Dysuria: Secondary | ICD-10-CM | POA: Diagnosis not present

## 2020-02-19 DIAGNOSIS — E1159 Type 2 diabetes mellitus with other circulatory complications: Secondary | ICD-10-CM | POA: Diagnosis not present

## 2020-02-19 DIAGNOSIS — E113293 Type 2 diabetes mellitus with mild nonproliferative diabetic retinopathy without macular edema, bilateral: Secondary | ICD-10-CM | POA: Diagnosis not present

## 2020-02-19 DIAGNOSIS — E1165 Type 2 diabetes mellitus with hyperglycemia: Secondary | ICD-10-CM | POA: Diagnosis not present

## 2020-02-19 DIAGNOSIS — R809 Proteinuria, unspecified: Secondary | ICD-10-CM | POA: Diagnosis not present

## 2020-02-19 DIAGNOSIS — Z794 Long term (current) use of insulin: Secondary | ICD-10-CM | POA: Diagnosis not present

## 2020-02-19 DIAGNOSIS — E1129 Type 2 diabetes mellitus with other diabetic kidney complication: Secondary | ICD-10-CM | POA: Diagnosis not present

## 2020-02-19 DIAGNOSIS — E669 Obesity, unspecified: Secondary | ICD-10-CM | POA: Diagnosis not present

## 2020-02-19 DIAGNOSIS — E1169 Type 2 diabetes mellitus with other specified complication: Secondary | ICD-10-CM | POA: Diagnosis not present

## 2020-03-04 DIAGNOSIS — E1165 Type 2 diabetes mellitus with hyperglycemia: Secondary | ICD-10-CM | POA: Diagnosis not present

## 2020-04-26 ENCOUNTER — Other Ambulatory Visit: Payer: Self-pay | Admitting: Oncology

## 2020-05-01 ENCOUNTER — Inpatient Hospital Stay: Payer: Medicare HMO | Attending: Oncology

## 2020-05-01 DIAGNOSIS — D509 Iron deficiency anemia, unspecified: Secondary | ICD-10-CM | POA: Insufficient documentation

## 2020-05-01 LAB — CBC WITH DIFFERENTIAL/PLATELET
Abs Immature Granulocytes: 0.05 10*3/uL (ref 0.00–0.07)
Basophils Absolute: 0.1 10*3/uL (ref 0.0–0.1)
Basophils Relative: 1 %
Eosinophils Absolute: 0.1 10*3/uL (ref 0.0–0.5)
Eosinophils Relative: 1 %
HCT: 31.3 % — ABNORMAL LOW (ref 39.0–52.0)
Hemoglobin: 10.5 g/dL — ABNORMAL LOW (ref 13.0–17.0)
Immature Granulocytes: 1 %
Lymphocytes Relative: 11 %
Lymphs Abs: 0.9 10*3/uL (ref 0.7–4.0)
MCH: 30.5 pg (ref 26.0–34.0)
MCHC: 33.5 g/dL (ref 30.0–36.0)
MCV: 91 fL (ref 80.0–100.0)
Monocytes Absolute: 0.5 10*3/uL (ref 0.1–1.0)
Monocytes Relative: 6 %
Neutro Abs: 7 10*3/uL (ref 1.7–7.7)
Neutrophils Relative %: 80 %
Platelets: 198 10*3/uL (ref 150–400)
RBC: 3.44 MIL/uL — ABNORMAL LOW (ref 4.22–5.81)
RDW: 14.4 % (ref 11.5–15.5)
WBC: 8.5 10*3/uL (ref 4.0–10.5)
nRBC: 0 % (ref 0.0–0.2)

## 2020-05-01 LAB — IRON AND TIBC
Iron: 91 ug/dL (ref 45–182)
Saturation Ratios: 29 % (ref 17.9–39.5)
TIBC: 315 ug/dL (ref 250–450)
UIBC: 224 ug/dL

## 2020-05-01 LAB — FERRITIN: Ferritin: 102 ng/mL (ref 24–336)

## 2020-05-03 ENCOUNTER — Inpatient Hospital Stay: Payer: Medicare HMO

## 2020-05-03 ENCOUNTER — Inpatient Hospital Stay (HOSPITAL_BASED_OUTPATIENT_CLINIC_OR_DEPARTMENT_OTHER): Payer: Medicare HMO | Admitting: Oncology

## 2020-05-03 VITALS — BP 124/88 | HR 72 | Temp 97.2°F | Resp 16 | Wt 166.8 lb

## 2020-05-03 DIAGNOSIS — D508 Other iron deficiency anemias: Secondary | ICD-10-CM

## 2020-05-03 DIAGNOSIS — D509 Iron deficiency anemia, unspecified: Secondary | ICD-10-CM | POA: Diagnosis not present

## 2020-05-03 MED ORDER — FEROSUL 325 (65 FE) MG PO TABS: 325.0000 mg | ORAL_TABLET | Freq: Two times a day (BID) | ORAL | 1 refills | Status: DC

## 2020-05-03 NOTE — Progress Notes (Signed)
[name]  [phone] [phone]) [phone]  ID: [name] OB: May 09, 1950  MR#: [id number]  JQB#:[id number]  Patient Care Team: Juluis Pitch, MD as PCP - General (Family Medicine) Efrain Sella, MD as Consulting Physician (Gastroenterology) Lloyd Huger, MD as Consulting Physician (Oncology)  CHIEF COMPLAINT: Iron deficiency anemia.  INTERVAL HISTORY: Patient returns to clinic today for further evaluation and consideration of additional Feraheme.  He last received IV Feraheme on 10/25/2019.  He was last seen in clinic on 01/26/2020.  In the interim, he has done well.  He denies any symptoms of anemia.  He denies feeling weak or fatigued.  He has no neurologic complaints.  He denies any fevers.  He has a good appetite and denies weight loss.  He has no chest pain, shortness of breath, cough, or hemoptysis.  He denies any nausea, vomiting, constipation, or diarrhea.  He has no melena or hematochezia.  He has no urinary complaints.  Patient offers no specific complaints today.  REVIEW OF SYSTEMS:   Review of Systems  Constitutional: Negative.  Negative for fever, malaise/fatigue and weight loss.  Respiratory: Negative.  Negative for cough, hemoptysis and shortness of breath.   Cardiovascular: Negative.  Negative for chest pain and leg swelling.  Gastrointestinal: Negative.  Negative for abdominal pain, blood in stool and melena.  Genitourinary: Negative.  Negative for hematuria.  Musculoskeletal: Negative.  Negative for back pain.  Skin: Negative.  Negative for rash.  Neurological: Negative.  Negative for dizziness, focal weakness, weakness and headaches.  Psychiatric/Behavioral: Negative.  The patient is not nervous/anxious.     As per HPI. Otherwise, a complete review of systems is negative.  PAST MEDICAL HISTORY: Past Medical History:  Diagnosis Date  . Arthritis   . CHF (congestive heart failure), NYHA class II, acute on chronic,  systolic (HCC)    EF 40% 11/7351  . Coronary artery disease    [facility]  . Diabetes mellitus without complication ([place])   . Hyperlipidemia   . Hypertension   . Melanoma ([place]) 08/20/2015   Right neck. Superficial spreading, arising in nevus. Tumor thickness 1.33mm, Anatomic level IV  . Numbness in right leg    due to back  . Skin cancer    removed l arm  . Spinal stenosis   . Stented coronary artery 2000   X 1 STENT  . Swelling of both lower extremities     PAST SURGICAL HISTORY: Past Surgical History:  Procedure Laterality Date  . CATARACT EXTRACTION W/PHACO Right 05/03/2019   Procedure: CATARACT EXTRACTION PHACO AND INTRAOCULAR LENS PLACEMENT (IOC) RIGHT DIABETIC 6.44 00:58.4 11.0%;  Surgeon: Leandrew Koyanagi, MD;  Location: [facility];  Service: Ophthalmology;  Laterality: Right;  DIABETIC  . CATARACT EXTRACTION W/PHACO Left 06/28/2019   Procedure: CATARACT EXTRACTION PHACO AND INTRAOCULAR LENS PLACEMENT (IOC) LEFT DIABETIC 7.99  01:13.4  10.9% ;  Surgeon: Leandrew Koyanagi, MD;  Location: [facility];  Service: Ophthalmology;  Laterality: Left;  Diabetic - insulin and oral meds  . CHOLECYSTECTOMY    . JOINT REPLACEMENT     RT KNEE  . LUMBAR LAMINECTOMY/DECOMPRESSION MICRODISCECTOMY  04/13/2012   Procedure: LUMBAR LAMINECTOMY/DECOMPRESSION MICRODISCECTOMY 1 LEVEL;  Surgeon: Johnn Hai, MD;  Location: [facility];  Service: Orthopedics;  Laterality: Bilateral;  L4-L5  . UVULECTOMY  1990's    FAMILY HISTORY: No family history on file.  ADVANCED DIRECTIVES (Y/N):  N  HEALTH MAINTENANCE: Social History   Tobacco Use  . Smoking  status: Never Smoker  . Smokeless tobacco: Never Used  Vaping Use  . Vaping Use: Never used  Substance Use Topics  . Alcohol use: Yes    Comment: rare  . Drug use: No     Colonoscopy:  PAP:  Bone density:  Lipid panel:  Allergies  Allergen Reactions  . Metoprolol Other (See Comments)    Cant  remember  . Toprol Xl [Metoprolol Tartrate] Other (See Comments)    Cant remember  . Buprenorphine Hcl Itching and Rash  . Morphine Itching and Other (See Comments)  . Morphine And Related Itching and Rash  . Triamcinolone Acetonide Rash    Current Outpatient Medications  Medication Sig Dispense Refill  . amLODipine (NORVASC) 5 MG tablet Take 5 mg by mouth daily.    Marland Kitchen aspirin 325 MG tablet Take 1 tablet (325 mg total) by mouth daily.    Marland Kitchen atorvastatin (LIPITOR) 40 MG tablet Take 80 mg by mouth every evening.     Marland Kitchen atorvastatin (LIPITOR) 80 MG tablet Take 80 mg by mouth daily.    . Continuous Blood Gluc Receiver (DEXCOM G6 RECEIVER) DEVI Use to monitor blood sugar. [place] [phone]    . Continuous Blood Gluc Sensor (DEXCOM G6 SENSOR) MISC Use to monitor blood sugar.  Replace every 10 days. [place] [phone].    Marland Kitchen Continuous Blood Gluc Transmit (DEXCOM G6 TRANSMITTER) MISC Use to monitor blood sugar.  Replace every 3 months. [place] [phone].    . dapagliflozin propanediol (FARXIGA) 10 MG TABS tablet Take by mouth.    . FEROSUL 325 (65 Fe) MG tablet Take 1 tablet (325 mg total) by mouth 2 (two) times daily. 180 tablet 1  . furosemide (LASIX) 20 MG tablet Take 20 mg by mouth.    . furosemide (LASIX) 20 MG tablet Take by mouth.    Marland Kitchen glipiZIDE (GLUCOTROL XL) 5 MG 24 hr tablet Take 5 mg by mouth 2 (two) times daily.    . insulin aspart (NOVOLOG) 100 UNIT/ML injection Inject into the skin 3 (three) times daily before meals.    . insulin glargine (LANTUS) 100 UNIT/ML injection Inject 44 Units into the skin daily.    . isosorbide mononitrate (IMDUR) 30 MG 24 hr tablet Take by mouth.    Marland Kitchen LANTUS SOLOSTAR 100 UNIT/ML Solostar Pen SMARTSIG:44 Unit(s) SUB-Q Every Night    . losartan (COZAAR) 100 MG tablet Take by mouth.    . losartan (COZAAR) 50 MG tablet Take 100 mg by mouth every evening.     . metFORMIN (GLUCOPHAGE) 1000 MG tablet Take 1,000 mg by mouth 2 (two) times daily with a meal.    .  naproxen sodium (ANAPROX) 220 MG tablet Take 220 mg by mouth 2 (two) times daily as needed.      No current facility-administered medications for this visit.    OBJECTIVE: Vitals:   05/03/20 1415 05/03/20 1422  BP: (!) 165/67 124/88  Pulse: 91 72  Resp: 18 16  Temp: 98.4 F (36.9 C) (!) 97.2 F (36.2 C)  SpO2: 98% 100%     Body mass index is 22.62 kg/m.    ECOG FS:0 - Asymptomatic  Physical Exam Constitutional:      General: Vital signs are normal.     Appearance: Normal appearance.  HENT:     Head: Normocephalic and atraumatic.  Eyes:     Pupils: Pupils are equal, round, and reactive to light.  Cardiovascular:     Rate and Rhythm: Normal rate and regular rhythm.  Heart sounds: Normal heart sounds. No murmur heard.   Pulmonary:     Effort: Pulmonary effort is normal.     Breath sounds: Normal breath sounds. No wheezing.  Abdominal:     General: Bowel sounds are normal. There is no distension.     Palpations: Abdomen is soft.     Tenderness: There is no abdominal tenderness.  Musculoskeletal:        General: No edema. Normal range of motion.     Cervical back: Normal range of motion.  Skin:    General: Skin is warm and dry.     Findings: No rash.  Neurological:     Mental Status: He is alert and oriented to person, place, and time.  Psychiatric:        Judgment: Judgment normal.     LAB RESULTS:  Lab Results  Component Value Date   NA 136 10/15/2019   K 4.6 10/15/2019   CL 104 10/15/2019   CO2 22 10/15/2019   GLUCOSE 380 (H) 10/15/2019   BUN 27 (H) 10/15/2019   CREATININE 1.04 10/15/2019   CALCIUM 8.7 (L) 10/15/2019   PROT 6.5 05/16/2019   ALBUMIN 3.6 05/16/2019   AST 14 (L) 05/16/2019   ALT 17 05/16/2019   ALKPHOS 80 05/16/2019   BILITOT 1.2 05/16/2019   GFRNONAA >60 10/15/2019   GFRAA >60 10/15/2019    Lab Results  Component Value Date   WBC 8.5 05/01/2020   NEUTROABS 7.0 05/01/2020   HGB 10.5 (L) 05/01/2020   HCT 31.3 (L) 05/01/2020    MCV 91.0 05/01/2020   PLT 198 05/01/2020   Lab Results  Component Value Date   IRON 91 05/01/2020   TIBC 315 05/01/2020   IRONPCTSAT 29 05/01/2020   Lab Results  Component Value Date   FERRITIN 102 05/01/2020     STUDIES: No results found.  ASSESSMENT: Iron deficiency anemia.  PLAN:   1.  Iron deficiency anemia:  -Unclear etiology of anemia. -Not overly symptomatic. -He does not appear to have been worked up by GI. -Has history of hemoptysis on 10/15/2019-work-up included CTA which was negative for PE but positive for pneumonia.  Treated with antibiotics outpatient. -No additional evidence of bleeding per patient. -Labs from 05/01/2020 show a hemoglobin of 10.5 (10.6), MCV 91, ferritin 2, saturation 29%. -No additional IV iron needed today. -At his next visit we will check CBC, CMP, iron, ferritin, B12, folate, copper to rule out other nutritional deficiencies.  Disposition: -RTC in 4 months for labs (CBC, CMP, iron, ferritin, B12, folate and copper), MD assessment and possible IV iron.  Greater than 50% was spent in counseling and coordination of care with this patient including but not limited to discussion of the relevant topics above (See A&P) including, but not limited to diagnosis and management of acute and chronic medical conditions.   Patient expressed understanding and was in agreement with this plan. He also understands that He can call clinic at any time with any questions, concerns, or complaints.    Jacquelin Hawking, NP   05/03/2020 2:45 PM

## 2020-05-07 ENCOUNTER — Encounter: Payer: Self-pay | Admitting: Dermatology

## 2020-05-07 ENCOUNTER — Other Ambulatory Visit: Payer: Self-pay

## 2020-05-07 ENCOUNTER — Ambulatory Visit: Payer: Medicare HMO | Admitting: Dermatology

## 2020-05-07 DIAGNOSIS — L578 Other skin changes due to chronic exposure to nonionizing radiation: Secondary | ICD-10-CM | POA: Diagnosis not present

## 2020-05-07 DIAGNOSIS — Z85820 Personal history of malignant melanoma of skin: Secondary | ICD-10-CM | POA: Diagnosis not present

## 2020-05-07 DIAGNOSIS — L820 Inflamed seborrheic keratosis: Secondary | ICD-10-CM | POA: Diagnosis not present

## 2020-05-07 DIAGNOSIS — D034 Melanoma in situ of scalp and neck: Secondary | ICD-10-CM

## 2020-05-07 DIAGNOSIS — D2339 Other benign neoplasm of skin of other parts of face: Secondary | ICD-10-CM | POA: Diagnosis not present

## 2020-05-07 DIAGNOSIS — L821 Other seborrheic keratosis: Secondary | ICD-10-CM | POA: Diagnosis not present

## 2020-05-07 DIAGNOSIS — D485 Neoplasm of uncertain behavior of skin: Secondary | ICD-10-CM

## 2020-05-07 DIAGNOSIS — D039 Melanoma in situ, unspecified: Secondary | ICD-10-CM

## 2020-05-07 HISTORY — DX: Melanoma in situ, unspecified: D03.9

## 2020-05-07 NOTE — Patient Instructions (Signed)

## 2020-05-07 NOTE — Progress Notes (Signed)
Follow-Up Visit   Subjective  [name] is a 70 y.o. male who presents for the following: Skin Problem.  Patient here today for a spot on his right cheek that he hits with shaving, present for a few months. He also has itchy spots in his scalp that are hit when he gets a haircut. He has a spot on his left forearm for a few months that scabs and bleeds when hit/rubbed.   Patient has a history of malignant melanoma of the right neck. Superficial spreading, arising in nevus. Tumor thickness 1.51mm, Anatomic level IV. Treated 08/20/2015. He also has a history of skin cancer treated on his left forearm in the past.  The following portions of the chart were reviewed this encounter and updated as appropriate:       Review of Systems:  No other skin or systemic complaints except as noted in HPI or Assessment and Plan.  Objective  Well appearing patient in no apparent distress; mood and affect are within normal limits.  A focused examination was performed including face, scalp, arms. Relevant physical exam findings are noted in the Assessment and Plan.  Objective  Right Neck: Well healed scar with no evidence of recurrence.  Objective  L distal forearm x 1, L temporal hairline x 1 (2): waxy stuck-on papule scalp Waxy heme crusted keratotic papule with surrounding purpura- forearm, h/o recent trauma to area with bleeding  Objective  R cheek: 4.47mm firm flesh papule     Objective  R lateral neck ant to scar: 5.29mm speckled brown macule        Assessment & Plan   Actinic Damage - chronic, secondary to cumulative UV radiation exposure/sun exposure over time - diffuse scaly erythematous macules with underlying dyspigmentation - Recommend daily broad spectrum sunscreen SPF 30+ to sun-exposed areas, reapply every 2 hours as needed.  - Call for new or changing lesions.  Seborrheic Keratoses - Stuck-on, waxy, tan-brown papules and plaques  - Discussed benign etiology and  prognosis. - Observe - Call for any changes   History of melanoma Right Neck  Superficial spreading, arising in nevus. Tumor thickness 1.19mm, Anatomic level IV. 08/20/2015  Clear. Observe for recurrence. Call clinic for new or changing lesions.  Recommend regular skin exams, daily broad-spectrum spf 30+ sunscreen use, and photoprotection.     Inflamed seborrheic keratosis (2) L distal forearm x 1, L temporal hairline x 1  Recheck L distal forearm on f/up.  Biopsy if not improved  Destruction of lesion - L distal forearm x 1, L temporal hairline x 1  Destruction method: cryotherapy   Informed consent: discussed and consent obtained   Lesion destroyed using liquid nitrogen: Yes   Region frozen until ice ball extended beyond lesion: Yes   Outcome: patient tolerated procedure well with no complications   Post-procedure details: wound care instructions given    Neoplasm of uncertain behavior of skin (2) R cheek  Epidermal / dermal shaving  Lesion diameter (cm):  0.4 Informed consent: discussed and consent obtained   Patient was prepped and draped in usual sterile fashion: Area prepped with alcohol. Anesthesia: the lesion was anesthetized in a standard fashion   Anesthetic:  1% lidocaine w/ epinephrine 1-100,000 buffered w/ 8.4% NaHCO3 Instrument used: flexible razor blade   Hemostasis achieved with: pressure, aluminum chloride and electrodesiccation   Outcome: patient tolerated procedure well   Post-procedure details: wound care instructions given   Post-procedure details comment:  Ointment and small bandage applied  Specimen  1 - Surgical pathology Differential Diagnosis: Irritated nevus vs Skin tag vs Fibroma vs other Check Margins: No 4.16mm firm flesh papule  R lateral neck ant to scar  Epidermal / dermal shaving  Lesion diameter (cm):  0.6 Informed consent: discussed and consent obtained   Patient was prepped and draped in usual sterile fashion: Area prepped with  alcohol. Anesthesia: the lesion was anesthetized in a standard fashion   Anesthetic:  1% lidocaine w/ epinephrine 1-100,000 buffered w/ 8.4% NaHCO3 Instrument used: flexible razor blade   Hemostasis achieved with: pressure, aluminum chloride and electrodesiccation   Outcome: patient tolerated procedure well   Post-procedure details: wound care instructions given   Post-procedure details comment:  Ointment and small bandage applied  Specimen 2 - Surgical pathology Differential Diagnosis: Lentigo vs SK r/o Atypia Check Margins: Yes 5.36mm speckled brown macule    Return in about 6 weeks (around 06/18/2020) for UBSE and recheck left forearm. Hx MM.Lindi Adie, CMA, am acting as scribe for Brendolyn Patty, MD .  Documentation: I have reviewed the above documentation for accuracy and completeness, and I agree with the above.  Brendolyn Patty MD

## 2020-05-13 ENCOUNTER — Telehealth: Payer: Self-pay

## 2020-05-13 NOTE — Telephone Encounter (Signed)
Dr Nicole Kindred advised patient of biopsy results. I called patient back to schedule surgery for melanoma in situ of the right lateral neck ant to scar. Surgery scheduled for May 22, 2020 at 12:00pm.

## 2020-05-13 NOTE — Telephone Encounter (Signed)
-----   Message from Brendolyn Patty, MD sent at 05/13/2020 10:54 AM EST ----- 1. Skin , right cheek SEBACEOUS ADENOMA, BASE INVOLVED 2. Skin , right lateral neck ant to scar MELANOMA IN SITU ARISING IN A DYSPLASTIC NEVUS, CLOSE TO MARGIN  1. Benign growth of oil gland, sometimes these can be associated with increased risk of colon or genitourinary cancer (muir-torre syndrome).  I would recommend screening colonoscopy if not done within past 5 years. 2. Melanoma IS- needs excision  Discussed with patient, please schedule for surgery

## 2020-05-14 DIAGNOSIS — Z794 Long term (current) use of insulin: Secondary | ICD-10-CM | POA: Diagnosis not present

## 2020-05-14 DIAGNOSIS — E113293 Type 2 diabetes mellitus with mild nonproliferative diabetic retinopathy without macular edema, bilateral: Secondary | ICD-10-CM | POA: Diagnosis not present

## 2020-05-21 DIAGNOSIS — E1165 Type 2 diabetes mellitus with hyperglycemia: Secondary | ICD-10-CM | POA: Diagnosis not present

## 2020-05-21 DIAGNOSIS — Z794 Long term (current) use of insulin: Secondary | ICD-10-CM | POA: Diagnosis not present

## 2020-05-21 DIAGNOSIS — E1159 Type 2 diabetes mellitus with other circulatory complications: Secondary | ICD-10-CM | POA: Diagnosis not present

## 2020-05-21 DIAGNOSIS — E113293 Type 2 diabetes mellitus with mild nonproliferative diabetic retinopathy without macular edema, bilateral: Secondary | ICD-10-CM | POA: Diagnosis not present

## 2020-05-21 DIAGNOSIS — E1169 Type 2 diabetes mellitus with other specified complication: Secondary | ICD-10-CM | POA: Diagnosis not present

## 2020-05-21 DIAGNOSIS — R809 Proteinuria, unspecified: Secondary | ICD-10-CM | POA: Diagnosis not present

## 2020-05-21 DIAGNOSIS — E1129 Type 2 diabetes mellitus with other diabetic kidney complication: Secondary | ICD-10-CM | POA: Diagnosis not present

## 2020-05-21 DIAGNOSIS — E669 Obesity, unspecified: Secondary | ICD-10-CM | POA: Diagnosis not present

## 2020-05-22 ENCOUNTER — Ambulatory Visit: Payer: Medicare HMO | Admitting: Dermatology

## 2020-05-22 ENCOUNTER — Encounter: Payer: Self-pay | Admitting: Dermatology

## 2020-05-22 ENCOUNTER — Other Ambulatory Visit: Payer: Self-pay

## 2020-05-22 DIAGNOSIS — L988 Other specified disorders of the skin and subcutaneous tissue: Secondary | ICD-10-CM | POA: Diagnosis not present

## 2020-05-22 DIAGNOSIS — D034 Melanoma in situ of scalp and neck: Secondary | ICD-10-CM | POA: Diagnosis not present

## 2020-05-22 MED ORDER — MUPIROCIN 2 % EX OINT: TOPICAL_OINTMENT | CUTANEOUS | 0 refills | Status: DC

## 2020-05-22 MED ORDER — DOXYCYCLINE HYCLATE 100 MG PO CAPS
100.0000 mg | ORAL_CAPSULE | Freq: Two times a day (BID) | ORAL | 0 refills | Status: AC
Start: 2020-05-22 — End: 2020-05-29

## 2020-05-22 NOTE — Progress Notes (Signed)
   Follow-Up Visit   Subjective  [name] is a 70 y.o. male who presents for the following: Procedure (Melanoma in situ arising in dysplastic nevus of the right lateral neck anterior to scar. Patient here for excision.).   The following portions of the chart were reviewed this encounter and updated as appropriate:       Review of Systems:  No other skin or systemic complaints except as noted in HPI or Assessment and Plan.  Objective  Well appearing patient in no apparent distress; mood and affect are within normal limits.  A focused examination was performed including right neck. Relevant physical exam findings are noted in the Assessment and Plan.  Objective  Right Lateral Neck Anterior to Scar: Pink biopsy scar. 10x 5 mm   Assessment & Plan  Melanoma in situ of neck (HCC) Right Lateral Neck Anterior to Scar  Skin excision  Lesion length (cm):  1 Lesion width (cm):  0.5 Margin per side (cm):  0.5 Total excision diameter (cm):  2 Informed consent: discussed and consent obtained   Timeout: patient name, date of birth, surgical site, and procedure verified   Procedure prep:  Patient was prepped and draped in usual sterile fashion Prep type:  Povidone-iodine Anesthesia: the lesion was anesthetized in a standard fashion   Anesthetic:  1% lidocaine w/ epinephrine 1-100,000 buffered w/ 8.4% NaHCO3 (13cc) Instrument used: #15 blade   Hemostasis achieved with: pressure and electrodesiccation   Outcome: patient tolerated procedure well with no complications   Additional details:  12:00 superior tag  Skin repair Complexity:  Complex Final length (cm):  3.5 Informed consent: discussed and consent obtained   Reason for type of repair: reduce tension to allow closure, reduce the risk of dehiscence, infection, and necrosis, reduce subcutaneous dead space and avoid a hematoma, allow closure of the large defect, preserve normal anatomical and functional relationships and enhance  both functionality and cosmetic results   Undermining: area extensively undermined   Undermining comment:  2 cm medial edge Subcutaneous layers (deep stitches):  Suture size:  4-0 Suture type: Vicryl (polyglactin 910)   Stitches:  Buried vertical mattress Fine/surface layer approximation (top stitches):  Suture size:  4-0 Suture type: nylon   Suture type comment:  Nylon Stitches: simple running   Suture removal (days):  7 Hemostasis achieved with: suture and pressure Outcome: patient tolerated procedure well with no complications   Post-procedure details: sterile dressing applied and wound care instructions given   Dressing type: pressure dressing (mupirocin)    doxycycline (VIBRAMYCIN) 100 MG capsule  mupirocin ointment (BACTROBAN) 2 %  Specimen 1 - Surgical pathology Differential Diagnosis: Melanoma in situ arising in a dysplastic nevus D03.4, 12:00 superior tag Check Margins: Yes Pink biopsy scar. [id number]  Start Doxycycline 100mg  1 po bid with food for 7 days #14 0Rf Start Mupirocin oint QD with bandage change.  Doxycycline should be taken with food to prevent nausea. Do not lay down for 30 minutes after taking. Be cautious with sun exposure and use good sun protection while on this medication. Pregnant women should not take this medication.    Return in about 1 week (around 05/29/2020) for suture removal.   I, Jamesetta Orleans, CMA, am acting as scribe for Brendolyn Patty, MD .  Documentation: I have reviewed the above documentation for accuracy and completeness, and I agree with the above.  Brendolyn Patty MD

## 2020-05-22 NOTE — Patient Instructions (Addendum)
Wound Care Instructions  1. Cleanse wound gently with soap and water once a day then pat dry with clean gauze. Apply a thing coat of Petrolatum (petroleum jelly, "Vaseline") over the wound (unless you have an allergy to this). We recommend that you use a new, sterile tube of Vaseline. Do not pick or remove scabs. Do not remove the yellow or white "healing tissue" from the base of the wound.  2. Cover the wound with fresh, clean, nonstick gauze and secure with paper tape. You may use Band-Aids in place of gauze and tape if the would is small enough, but would recommend trimming much of the tape off as there is often too much. Sometimes Band-Aids can irritate the skin.  3. You should call the office for your biopsy report after 1 week if you have not already been contacted.  4. If you experience any problems, such as abnormal amounts of bleeding, swelling, significant bruising, significant pain, or evidence of infection, please call the office immediately.  5. FOR ADULT SURGERY PATIENTS: If you need something for pain relief you may take 1 extra strength Tylenol (acetaminophen) AND 2 Ibuprofen (200mg  each) together every 4 hours as needed for pain. (do not take these if you are allergic to them or if you have a reason you should not take them.) Typically, you may only need pain medication for 1 to 3 days.    Doxycycline should be taken with food to prevent nausea. Do not lay down for 30 minutes after taking. Be cautious with sun exposure and use good sun protection while on this medication. Pregnant women should not take this medication.

## 2020-05-23 ENCOUNTER — Telehealth: Payer: Self-pay

## 2020-05-23 NOTE — Telephone Encounter (Signed)
Patient is doing well from surgery. No complications. Patient will call son today to help with dressing change. If unable to do this, I have advised patient we are here today until 5pm if he needs help.

## 2020-05-24 DIAGNOSIS — E1165 Type 2 diabetes mellitus with hyperglycemia: Secondary | ICD-10-CM | POA: Diagnosis not present

## 2020-05-29 ENCOUNTER — Encounter: Payer: Self-pay | Admitting: Dermatology

## 2020-05-29 ENCOUNTER — Ambulatory Visit (INDEPENDENT_AMBULATORY_CARE_PROVIDER_SITE_OTHER): Payer: Medicare HMO | Admitting: Dermatology

## 2020-05-29 ENCOUNTER — Other Ambulatory Visit: Payer: Self-pay

## 2020-05-29 DIAGNOSIS — Z4802 Encounter for removal of sutures: Secondary | ICD-10-CM

## 2020-05-29 DIAGNOSIS — D034 Melanoma in situ of scalp and neck: Secondary | ICD-10-CM

## 2020-05-29 NOTE — Progress Notes (Signed)
   Follow-Up Visit   Subjective  [name] is a 70 y.o. male who presents for the following: Melanoma IS margins free bx proven (R lat neck ant to scar, pt presents for suture removal).   The following portions of the chart were reviewed this encounter and updated as appropriate:       Review of Systems:  No other skin or systemic complaints except as noted in HPI or Assessment and Plan.  Objective  Well appearing patient in no apparent distress; mood and affect are within normal limits.  A focused examination was performed including neck. Relevant physical exam findings are noted in the Assessment and Plan.  Objective  Right lateral neck anterior to scar: Healing excision site   Assessment & Plan  Melanoma in situ of neck ([place]) Right lateral neck anterior to scar  Margins free, bx proven  Encounter for Removal of Sutures - Incision site at the R lateral neck anterior to scar is clean, dry and intact - Wound cleansed, sutures removed, wound cleansed and steri strips applied.  - Discussed pathology results showing Melanoma IS margins free  - Patient advised to keep steri-strips dry until they fall off. - Scars remodel for a full year. - Once steri-strips fall off, patient can apply over-the-counter silicone scar cream each night to help with scar remodeling if desired. - Patient advised to call with any concerns or if they notice any new or changing lesions.   Other Related Medications doxycycline (VIBRAMYCIN) 100 MG capsule mupirocin ointment (BACTROBAN) 2 %  Return in about 3 months (around 08/29/2020) for UBSE, Hx of Melanoma IS, recheck ISK forearm, forehead txted from previous visit.  I, Othelia Pulling, RMA, am acting as scribe for Brendolyn Patty, MD .  Documentation: I have reviewed the above documentation for accuracy and completeness, and I agree with the above.  Brendolyn Patty MD

## 2020-06-18 ENCOUNTER — Ambulatory Visit: Payer: Medicare HMO | Admitting: Dermatology

## 2020-07-01 DIAGNOSIS — R600 Localized edema: Secondary | ICD-10-CM | POA: Insufficient documentation

## 2020-07-01 DIAGNOSIS — I25118 Atherosclerotic heart disease of native coronary artery with other forms of angina pectoris: Secondary | ICD-10-CM | POA: Diagnosis not present

## 2020-07-01 DIAGNOSIS — R0602 Shortness of breath: Secondary | ICD-10-CM | POA: Diagnosis not present

## 2020-07-01 DIAGNOSIS — I5022 Chronic systolic (congestive) heart failure: Secondary | ICD-10-CM | POA: Diagnosis not present

## 2020-07-01 DIAGNOSIS — I6523 Occlusion and stenosis of bilateral carotid arteries: Secondary | ICD-10-CM | POA: Diagnosis not present

## 2020-07-01 DIAGNOSIS — E782 Mixed hyperlipidemia: Secondary | ICD-10-CM | POA: Diagnosis not present

## 2020-07-01 DIAGNOSIS — I10 Essential (primary) hypertension: Secondary | ICD-10-CM | POA: Diagnosis not present

## 2020-08-23 NOTE — Progress Notes (Deleted)
[place]  [phone] [phone]  ID: [name] OB: 1951/01/26  MR#: [id number]  YIF#:[id number]  Patient Care Team: Juluis Pitch, MD as PCP - General (Family Medicine) Efrain Sella, MD as Consulting Physician (Gastroenterology) Lloyd Huger, MD as Consulting Physician (Oncology)  CHIEF COMPLAINT: Iron deficiency anemia.  INTERVAL HISTORY: Patient returns to clinic today for further evaluation and consideration of additional Feraheme. He currently feels well and is asymptomatic.  He denies any further weakness or fatigue.  He has no neurologic complaints.  He denies any fevers.  He has a good appetite and denies weight loss.  He has no chest pain, shortness of breath, cough, or hemoptysis.  He denies any nausea, vomiting, constipation, or diarrhea.  He has no melena or hematochezia.  He has no urinary complaints.  Patient offers no specific complaints today.  REVIEW OF SYSTEMS:   Review of Systems  Constitutional: Negative.  Negative for fever, malaise/fatigue and weight loss.  Respiratory: Negative.  Negative for cough, hemoptysis and shortness of breath.   Cardiovascular: Negative.  Negative for chest pain and leg swelling.  Gastrointestinal: Negative.  Negative for abdominal pain, blood in stool and melena.  Genitourinary: Negative.  Negative for hematuria.  Musculoskeletal: Negative.  Negative for back pain.  Skin: Negative.  Negative for rash.  Neurological: Negative.  Negative for dizziness, focal weakness, weakness and headaches.  Psychiatric/Behavioral: Negative.  The patient is not nervous/anxious.    As per HPI. Otherwise, a complete review of systems is negative.  PAST MEDICAL HISTORY: Past Medical History:  Diagnosis Date   Arthritis    CHF (congestive heart failure), NYHA class II, acute on chronic, systolic (HCC)    EF 86% 09/6718   Coronary artery disease    [facility]   Diabetes  mellitus without complication ([place])    Hyperlipidemia    Hypertension    Melanoma ([place]) 08/20/2015   Right neck. Superficial spreading, arising in nevus. Tumor thickness 1.13mm, Anatomic level IV   Melanoma in situ ([place]) 05/22/2020   R lateral neck ant to scar, exc 05/22/20   Numbness in right leg    due to back   Skin cancer    removed l arm   Spinal stenosis    Stented coronary artery 2000   X 1 STENT   Swelling of both lower extremities     PAST SURGICAL HISTORY: Past Surgical History:  Procedure Laterality Date   CATARACT EXTRACTION W/PHACO Right 05/03/2019   Procedure: CATARACT EXTRACTION PHACO AND INTRAOCULAR LENS PLACEMENT (IOC) RIGHT DIABETIC 6.44 00:58.4 11.0%;  Surgeon: Leandrew Koyanagi, MD;  Location: [facility];  Service: Ophthalmology;  Laterality: Right;  DIABETIC   CATARACT EXTRACTION W/PHACO Left 06/28/2019   Procedure: CATARACT EXTRACTION PHACO AND INTRAOCULAR LENS PLACEMENT (IOC) LEFT DIABETIC 7.99  01:13.4  10.9% ;  Surgeon: Leandrew Koyanagi, MD;  Location: [facility];  Service: Ophthalmology;  Laterality: Left;  Diabetic - insulin and oral meds   CHOLECYSTECTOMY     JOINT REPLACEMENT     RT KNEE   LUMBAR LAMINECTOMY/DECOMPRESSION MICRODISCECTOMY  04/13/2012   Procedure: LUMBAR LAMINECTOMY/DECOMPRESSION MICRODISCECTOMY 1 LEVEL;  Surgeon: Johnn Hai, MD;  Location: [facility];  Service: Orthopedics;  Laterality: Bilateral;  L4-L5   UVULECTOMY  1990's    FAMILY HISTORY: No family history on file.  ADVANCED DIRECTIVES (Y/N):  N  HEALTH MAINTENANCE: Social History   Tobacco Use   Smoking status: Never   Smokeless tobacco: Never  Vaping Use   Vaping Use: Never used  Substance Use Topics   Alcohol use: Yes    Comment: rare   Drug use: No     Colonoscopy:  PAP:  Bone density:  Lipid panel:  Allergies  Allergen Reactions   Metoprolol Other (See Comments)    Cant remember   Toprol Xl [Metoprolol Tartrate] Other (See  Comments)    Cant remember   Buprenorphine Hcl Itching and Rash   Morphine Itching and Other (See Comments)   Morphine And Related Itching and Rash   Triamcinolone Acetonide Rash    Current Outpatient Medications  Medication Sig Dispense Refill   amLODipine (NORVASC) 5 MG tablet Take 5 mg by mouth daily.     aspirin 325 MG tablet Take 1 tablet (325 mg total) by mouth daily.     atorvastatin (LIPITOR) 80 MG tablet Take 80 mg by mouth daily.     Continuous Blood Gluc Receiver (DEXCOM G6 RECEIVER) DEVI Use to monitor blood sugar. [place] [phone]     Continuous Blood Gluc Sensor (DEXCOM G6 SENSOR) MISC Use to monitor blood sugar.  Replace every 10 days. [place] [phone].     Continuous Blood Gluc Transmit (DEXCOM G6 TRANSMITTER) MISC Use to monitor blood sugar.  Replace every 3 months. [place] [phone].     dapagliflozin propanediol (FARXIGA) 10 MG TABS tablet Take by mouth.     FEROSUL 325 (65 Fe) MG tablet Take 1 tablet (325 mg total) by mouth 2 (two) times daily. 180 tablet 1   furosemide (LASIX) 20 MG tablet Take by mouth.     insulin aspart (NOVOLOG) 100 UNIT/ML injection Inject into the skin 3 (three) times daily before meals.     insulin glargine (LANTUS) 100 UNIT/ML injection Inject 44 Units into the skin daily.     isosorbide mononitrate (IMDUR) 30 MG 24 hr tablet Take by mouth.     LANTUS SOLOSTAR 100 UNIT/ML Solostar Pen SMARTSIG:44 Unit(s) SUB-Q Every Night     losartan (COZAAR) 100 MG tablet Take by mouth.     metFORMIN (GLUCOPHAGE) 1000 MG tablet Take 1,000 mg by mouth 2 (two) times daily with a meal.     mupirocin ointment (BACTROBAN) 2 % Apply QD with bandage change 22 g 0   naproxen sodium (ANAPROX) 220 MG tablet Take 220 mg by mouth 2 (two) times daily as needed.      No current facility-administered medications for this visit.    OBJECTIVE: There were no vitals filed for this visit.    There is no height or weight on file to calculate BMI.    ECOG FS:0 -  Asymptomatic  General: Well-developed, well-nourished, no acute distress. Eyes: Pink conjunctiva, anicteric sclera. HEENT: Normocephalic, moist mucous membranes. Lungs: No audible wheezing or coughing. Heart: Regular rate and rhythm. Abdomen: Soft, nontender, no obvious distention. Musculoskeletal: No edema, cyanosis, or clubbing. Neuro: Alert, answering all questions appropriately. Cranial nerves grossly intact. Skin: No rashes or petechiae noted. Psych: Normal affect.   LAB RESULTS:  Lab Results  Component Value Date   NA 136 10/15/2019   K 4.6 10/15/2019   CL 104 10/15/2019   CO2 22 10/15/2019   GLUCOSE 380 (H) 10/15/2019   BUN 27 (H) 10/15/2019   CREATININE 1.04 10/15/2019   CALCIUM 8.7 (L) 10/15/2019   PROT 6.5 05/16/2019   ALBUMIN 3.6 05/16/2019   AST 14 (L) 05/16/2019   ALT 17 05/16/2019   ALKPHOS 80 05/16/2019   BILITOT 1.2 05/16/2019   GFRNONAA >  60 10/15/2019   GFRAA >60 10/15/2019    Lab Results  Component Value Date   WBC 8.5 05/01/2020   NEUTROABS 7.0 05/01/2020   HGB 10.5 (L) 05/01/2020   HCT 31.3 (L) 05/01/2020   MCV 91.0 05/01/2020   PLT 198 05/01/2020   Lab Results  Component Value Date   IRON 91 05/01/2020   TIBC 315 05/01/2020   IRONPCTSAT 29 05/01/2020   Lab Results  Component Value Date   FERRITIN 102 05/01/2020     STUDIES: No results found.  ASSESSMENT: Iron deficiency anemia.  PLAN:   1.  Iron deficiency anemia: Patient's hemoglobin remains decreased, but essentially unchanged at 10.6. His iron stores are now within normal limits. He does not require additional Feraheme today.  He has not seen GI yet and a referral was resent today. No further intervention is needed.  Return to clinic in 3 months for further evaluation and consideration of additional treatment.  2.  Pneumonia: Resolved.  I spent a total of 20 minutes reviewing chart data, face-to-face evaluation with the patient, counseling and coordination of care as detailed  above.  Patient expressed understanding and was in agreement with this plan. He also understands that He can call clinic at any time with any questions, concerns, or complaints.    Lloyd Huger, MD   08/23/2020 10:33 PM

## 2020-08-26 ENCOUNTER — Inpatient Hospital Stay: Payer: Medicare HMO | Attending: Oncology

## 2020-08-26 ENCOUNTER — Other Ambulatory Visit: Payer: Self-pay

## 2020-08-26 ENCOUNTER — Ambulatory Visit: Payer: Medicare HMO | Admitting: Dermatology

## 2020-08-26 DIAGNOSIS — L814 Other melanin hyperpigmentation: Secondary | ICD-10-CM

## 2020-08-26 DIAGNOSIS — D509 Iron deficiency anemia, unspecified: Secondary | ICD-10-CM | POA: Diagnosis not present

## 2020-08-26 DIAGNOSIS — Z85820 Personal history of malignant melanoma of skin: Secondary | ICD-10-CM

## 2020-08-26 DIAGNOSIS — L300 Nummular dermatitis: Secondary | ICD-10-CM

## 2020-08-26 DIAGNOSIS — D225 Melanocytic nevi of trunk: Secondary | ICD-10-CM

## 2020-08-26 DIAGNOSIS — Z86006 Personal history of melanoma in-situ: Secondary | ICD-10-CM

## 2020-08-26 DIAGNOSIS — L820 Inflamed seborrheic keratosis: Secondary | ICD-10-CM

## 2020-08-26 DIAGNOSIS — L578 Other skin changes due to chronic exposure to nonionizing radiation: Secondary | ICD-10-CM | POA: Diagnosis not present

## 2020-08-26 DIAGNOSIS — L570 Actinic keratosis: Secondary | ICD-10-CM | POA: Diagnosis not present

## 2020-08-26 DIAGNOSIS — D239 Other benign neoplasm of skin, unspecified: Secondary | ICD-10-CM

## 2020-08-26 DIAGNOSIS — L821 Other seborrheic keratosis: Secondary | ICD-10-CM

## 2020-08-26 DIAGNOSIS — D492 Neoplasm of unspecified behavior of bone, soft tissue, and skin: Secondary | ICD-10-CM

## 2020-08-26 DIAGNOSIS — D508 Other iron deficiency anemias: Secondary | ICD-10-CM

## 2020-08-26 DIAGNOSIS — Z1283 Encounter for screening for malignant neoplasm of skin: Secondary | ICD-10-CM | POA: Diagnosis not present

## 2020-08-26 DIAGNOSIS — D229 Melanocytic nevi, unspecified: Secondary | ICD-10-CM

## 2020-08-26 HISTORY — DX: Other benign neoplasm of skin, unspecified: D23.9

## 2020-08-26 LAB — CBC WITH DIFFERENTIAL/PLATELET
Abs Immature Granulocytes: 0.04 10*3/uL (ref 0.00–0.07)
Basophils Absolute: 0.1 10*3/uL (ref 0.0–0.1)
Basophils Relative: 1 %
Eosinophils Absolute: 0.1 10*3/uL (ref 0.0–0.5)
Eosinophils Relative: 1 %
HCT: 33.5 % — ABNORMAL LOW (ref 39.0–52.0)
Hemoglobin: 11.5 g/dL — ABNORMAL LOW (ref 13.0–17.0)
Immature Granulocytes: 1 %
Lymphocytes Relative: 11 %
Lymphs Abs: 0.9 10*3/uL (ref 0.7–4.0)
MCH: 30.2 pg (ref 26.0–34.0)
MCHC: 34.3 g/dL (ref 30.0–36.0)
MCV: 87.9 fL (ref 80.0–100.0)
Monocytes Absolute: 0.4 10*3/uL (ref 0.1–1.0)
Monocytes Relative: 5 %
Neutro Abs: 6.6 10*3/uL (ref 1.7–7.7)
Neutrophils Relative %: 81 %
Platelets: 197 10*3/uL (ref 150–400)
RBC: 3.81 MIL/uL — ABNORMAL LOW (ref 4.22–5.81)
RDW: 14 % (ref 11.5–15.5)
WBC: 8.1 10*3/uL (ref 4.0–10.5)
nRBC: 0 % (ref 0.0–0.2)

## 2020-08-26 LAB — FERRITIN: Ferritin: 70 ng/mL (ref 24–336)

## 2020-08-26 LAB — COMPREHENSIVE METABOLIC PANEL
ALT: 18 U/L (ref 0–44)
AST: 19 U/L (ref 15–41)
Albumin: 3.8 g/dL (ref 3.5–5.0)
Alkaline Phosphatase: 72 U/L (ref 38–126)
Anion gap: 9 (ref 5–15)
BUN: 26 mg/dL — ABNORMAL HIGH (ref 8–23)
CO2: 23 mmol/L (ref 22–32)
Calcium: 8.8 mg/dL — ABNORMAL LOW (ref 8.9–10.3)
Chloride: 104 mmol/L (ref 98–111)
Creatinine, Ser: 0.97 mg/dL (ref 0.61–1.24)
GFR, Estimated: >60 mL/min (ref >60)
Glucose, Bld: 358 mg/dL — ABNORMAL HIGH (ref 70–99)
Potassium: 4.2 mmol/L (ref 3.5–5.1)
Sodium: 136 mmol/L (ref 135–145)
Total Bilirubin: 1.1 mg/dL (ref 0.3–1.2)
Total Protein: 6.9 g/dL (ref 6.5–8.1)

## 2020-08-26 LAB — IRON AND TIBC
Iron: 73 ug/dL (ref 45–182)
Saturation Ratios: 23 % (ref 17.9–39.5)
TIBC: 319 ug/dL (ref 250–450)
UIBC: 246 ug/dL

## 2020-08-26 LAB — VITAMIN B12: Vitamin B-12: 1128 pg/mL — ABNORMAL HIGH (ref 180–914)

## 2020-08-26 LAB — FOLATE: Folate: 24 ng/mL (ref >5.9)

## 2020-08-26 NOTE — Patient Instructions (Addendum)
If you have any questions or concerns for your doctor, please call our main line at [phone] and press option 4 to reach your doctor's medical assistant. If no one answers, please leave a voicemail as directed and we will return your call as soon as possible. Messages left after 4 pm will be answered the following business day.   You may also send [place] a message via [place]. We typically respond to MyChart messages within 1-2 business days.  For prescription refills, please ask your pharmacy to contact our office. Our fax number is [phone].  If you have an urgent issue when the clinic is closed that cannot wait until the next business day, you can page your doctor at the number below.    Please note that while we do our best to be available for urgent issues outside of office hours, we are not available 24/7.   If you have an urgent issue and are unable to reach [place], you may choose to seek medical care at your doctor's office, retail clinic, urgent care center, or emergency room.  If you have a medical emergency, please immediately call 911 or go to the emergency department.  Pager Numbers  - Dr. [name]: [phone]  - Dr. Laurence Ferrari: [phone]  - Dr. Nicole Kindred: [phone]  In the event of inclement weather, please call our main line at [phone] for an update on the status of any delays or closures.  Dermatology Medication Tips: Please keep the boxes that topical medications come in in order to help keep track of the instructions about where and how to use these. Pharmacies typically print the medication instructions only on the boxes and not directly on the medication tubes.   If your medication is too expensive, please contact our office at [phone] option 4 or send [place] a message through [place].   We are unable to tell what your co-pay for medications will be in advance as this is different depending on your insurance coverage. However, we may be able to find a substitute  medication at lower cost or fill out paperwork to get insurance to cover a needed medication.   If a prior authorization is required to get your medication covered by your insurance company, please allow [place] 1-2 business days to complete this process.  Drug prices often vary depending on where the prescription is filled and some pharmacies may offer cheaper prices.  The website www.goodrx.com contains coupons for medications through different pharmacies. The prices here do not account for what the cost may be with help from insurance (it may be cheaper with your insurance), but the website can give you the price if you did not use any insurance.  - You can print the associated coupon and take it with your prescription to the pharmacy.  - You may also stop by our office during regular business hours and pick up a GoodRx coupon card.  - If you need your prescription sent electronically to a different pharmacy, notify our office through [facility] or by phone at [phone] option 4.   Seborrheic Keratosis  What causes seborrheic keratoses? Seborrheic keratoses are harmless, common skin growths that first appear during adult life.  As time goes by, more growths appear.  Some people may develop a large number of them.  Seborrheic keratoses appear on both covered and uncovered body parts.  They are not caused by sunlight.  The tendency to develop seborrheic keratoses can be inherited.  They vary in color from skin-colored to  gray, brown, or even black.  They can be either smooth or have a rough, warty surface.   Seborrheic keratoses are superficial and look as if they were stuck on the skin.  Under the microscope this type of keratosis looks like layers upon layers of skin.  That is why at times the top layer may seem to fall off, but the rest of the growth remains and re-grows.    Treatment Seborrheic keratoses do not need to be treated, but can easily be removed in the office.  Seborrheic  keratoses often cause symptoms when they rub on clothing or jewelry.  Lesions can be in the way of shaving.  If they become inflamed, they can cause itching, soreness, or burning.  Removal of a seborrheic keratosis can be accomplished by freezing, burning, or surgery. If any spot bleeds, scabs, or grows rapidly, please return to have it checked, as these can be an indication of a skin cancer.

## 2020-08-26 NOTE — Progress Notes (Signed)
Follow-Up Visit   Subjective  [name] is a 70 y.o. male who presents for the following: Upper body skin exam (Hx of Melanoma IS R lat neck, hx of Melanoma R neck) and check growths (R scalp/hairline, growing).  Spot on R shoulder gets irritated.   The following portions of the chart were reviewed this encounter and updated as appropriate:        Review of Systems:  No other skin or systemic complaints except as noted in HPI or Assessment and Plan.  Objective  Well appearing patient in no apparent distress; mood and affect are within normal limits.  All skin waist up examined.  R lat neck ant to scar Clear to visual exam and palpation  R neck Well healed scar with no evidence of recurrence, no lymphadenopathy.   R cheek x 8, nasal tip x 2, L zygoma x 1, L cheek x 2 (13) Pink scaly macules   R shoulder x 1, Total = 1 Erythematous keratotic or waxy stuck-on papule or plaque.   Left Flank 1.6 x 0.7 cm speckled brown macule with adjacent pink flesh pap       L post auricular neck, L elbow Pink scaly patch 2.0cm L post auricular, pink scaly patch L elbow      Assessment & Plan   Lentigines - Scattered tan macules - Due to sun exposure - Benign-appering, observe - Recommend daily broad spectrum sunscreen SPF 30+ to sun-exposed areas, reapply every 2 hours as needed. - Call for any changes  Seborrheic Keratoses - Stuck-on, waxy, tan-brown papules and/or plaques, including R temporal scalp  - Benign-appearing - Discussed benign etiology and prognosis. - Observe - Call for any changes  Melanocytic Nevi - Tan-brown and/or pink-flesh-colored symmetric macules and papules - Benign appearing on exam today - Observation - Call clinic for new or changing moles - Recommend daily use of broad spectrum spf 30+ sunscreen to sun-exposed areas.   Actinic Damage - Chronic condition, secondary to cumulative UV/sun exposure - diffuse scaly erythematous macules  with underlying dyspigmentation - Recommend daily broad spectrum sunscreen SPF 30+ to sun-exposed areas, reapply every 2 hours as needed.  - Staying in the shade or wearing long sleeves, sun glasses (UVA+UVB protection) and wide brim hats (4-inch brim around the entire circumference of the hat) are also recommended for sun protection.  - Call for new or changing lesions.  Skin cancer screening performed today.  History of melanoma in situ R lat neck ant to scar  Excised 05/22/20  Clear. Observe for recurrence. Call clinic for new or changing lesions.  Recommend regular skin exams, daily broad-spectrum spf 30+ sunscreen use, and photoprotection.     History of melanoma R neck  08/2015 Clear. Observe for recurrence. Call clinic for new or changing lesions.  Recommend regular skin exams, daily broad-spectrum spf 30+ sunscreen use, and photoprotection.     AK (actinic keratosis) (13) R cheek x 8, nasal tip x 2, L zygoma x 1, L cheek x 2  Destruction of lesion - R cheek x 8, nasal tip x 2, L zygoma x 1, L cheek x 2  Destruction method: cryotherapy   Informed consent: discussed and consent obtained   Lesion destroyed using liquid nitrogen: Yes   Region frozen until ice ball extended beyond lesion: Yes   Outcome: patient tolerated procedure well with no complications   Post-procedure details: wound care instructions given    Inflamed seborrheic keratosis R shoulder x 1, Total =  1  Destruction of lesion - R shoulder x 1, Total = 1  Destruction method: cryotherapy   Informed consent: discussed and consent obtained   Lesion destroyed using liquid nitrogen: Yes   Region frozen until ice ball extended beyond lesion: Yes   Outcome: patient tolerated procedure well with no complications   Post-procedure details: wound care instructions given    Neoplasm of skin Left Flank  Epidermal / dermal shaving  Lesion diameter (cm):  1.6 Informed consent: discussed and consent obtained    Patient was prepped and draped in usual sterile fashion: area prepped with alcohol. Anesthesia: the lesion was anesthetized in a standard fashion   Anesthetic:  1% lidocaine w/ epinephrine 1-100,000 buffered w/ 8.4% NaHCO3 Instrument used: flexible razor blade   Hemostasis achieved with: pressure, aluminum chloride and electrodesiccation   Outcome: patient tolerated procedure well   Post-procedure details: wound care instructions given   Post-procedure details comment:  Ointment and small bandage applied  Specimen 1 - Surgical pathology Differential Diagnosis: D48.5 Nevus vs Dysplastic Nevus  Check Margins: yes 1.6 x 0.7cm speckled brown macule with adjacent pink flesh pap  Nummular dermatitis L post auricular neck, L elbow  Vs ISK  Start Eucrisa oint qd/bid samples x 2 given Lot#[id number] 10/24 Recheck on f/up  Return in about 6 months (around 02/25/2021) for UBSE, Hx of Melanoma, Hx of Melanoma IS , Hx of AKs.  I, Othelia Pulling, RMA, am acting as scribe for Brendolyn Patty, MD .  Documentation: I have reviewed the above documentation for accuracy and completeness, and I agree with the above.  Brendolyn Patty MD

## 2020-08-27 ENCOUNTER — Inpatient Hospital Stay: Payer: Medicare HMO | Admitting: Oncology

## 2020-08-27 ENCOUNTER — Inpatient Hospital Stay: Payer: Medicare HMO

## 2020-08-28 LAB — COPPER, SERUM: Copper: 130 ug/dL (ref 69–132)

## 2020-09-02 ENCOUNTER — Telehealth: Payer: Self-pay

## 2020-09-02 NOTE — Telephone Encounter (Signed)
-----   Message from Brendolyn Patty, MD sent at 08/29/2020 12:08 PM EDT ----- Skin , left flank DYSPLASTIC COMPOUND NEVUS WITH MODERATE ATYPIA ADJACENT TO MELANOCYTIC NEVUS, INTRADERMAL TYPE, LIMITED MARGINS FREE  Moderately atypical mole with adjacent benign mole- recommend observation

## 2020-09-02 NOTE — Telephone Encounter (Signed)
Advised pt of bx results/sh ?

## 2020-09-10 DIAGNOSIS — E1165 Type 2 diabetes mellitus with hyperglycemia: Secondary | ICD-10-CM | POA: Diagnosis not present

## 2020-09-17 DIAGNOSIS — E1129 Type 2 diabetes mellitus with other diabetic kidney complication: Secondary | ICD-10-CM | POA: Diagnosis not present

## 2020-09-17 DIAGNOSIS — I10 Essential (primary) hypertension: Secondary | ICD-10-CM | POA: Diagnosis not present

## 2020-09-17 DIAGNOSIS — R809 Proteinuria, unspecified: Secondary | ICD-10-CM | POA: Diagnosis not present

## 2020-09-17 DIAGNOSIS — E1169 Type 2 diabetes mellitus with other specified complication: Secondary | ICD-10-CM | POA: Diagnosis not present

## 2020-09-17 DIAGNOSIS — E669 Obesity, unspecified: Secondary | ICD-10-CM | POA: Diagnosis not present

## 2020-09-17 DIAGNOSIS — Z794 Long term (current) use of insulin: Secondary | ICD-10-CM | POA: Diagnosis not present

## 2020-09-17 DIAGNOSIS — E113293 Type 2 diabetes mellitus with mild nonproliferative diabetic retinopathy without macular edema, bilateral: Secondary | ICD-10-CM | POA: Diagnosis not present

## 2020-09-17 DIAGNOSIS — E1159 Type 2 diabetes mellitus with other circulatory complications: Secondary | ICD-10-CM | POA: Diagnosis not present

## 2020-09-17 DIAGNOSIS — E1165 Type 2 diabetes mellitus with hyperglycemia: Secondary | ICD-10-CM | POA: Diagnosis not present

## 2020-11-27 ENCOUNTER — Other Ambulatory Visit: Payer: Self-pay

## 2020-11-27 DIAGNOSIS — D508 Other iron deficiency anemias: Secondary | ICD-10-CM

## 2020-11-27 DIAGNOSIS — D509 Iron deficiency anemia, unspecified: Secondary | ICD-10-CM

## 2020-11-28 ENCOUNTER — Other Ambulatory Visit: Payer: Self-pay

## 2020-11-28 ENCOUNTER — Inpatient Hospital Stay: Payer: Medicare HMO | Attending: Oncology

## 2020-11-28 DIAGNOSIS — D509 Iron deficiency anemia, unspecified: Secondary | ICD-10-CM | POA: Insufficient documentation

## 2020-11-28 LAB — IRON AND TIBC
Iron: 51 ug/dL (ref 45–182)
Saturation Ratios: 16 % — ABNORMAL LOW (ref 17.9–39.5)
TIBC: 318 ug/dL (ref 250–450)
UIBC: 267 ug/dL

## 2020-11-28 LAB — CBC WITH DIFFERENTIAL/PLATELET
Abs Immature Granulocytes: 0.03 10*3/uL (ref 0.00–0.07)
Basophils Absolute: 0.1 10*3/uL (ref 0.0–0.1)
Basophils Relative: 1 %
Eosinophils Absolute: 0.1 10*3/uL (ref 0.0–0.5)
Eosinophils Relative: 1 %
HCT: 36 % — ABNORMAL LOW (ref 39.0–52.0)
Hemoglobin: 11.8 g/dL — ABNORMAL LOW (ref 13.0–17.0)
Immature Granulocytes: 0 %
Lymphocytes Relative: 10 %
Lymphs Abs: 0.8 10*3/uL (ref 0.7–4.0)
MCH: 29.9 pg (ref 26.0–34.0)
MCHC: 32.8 g/dL (ref 30.0–36.0)
MCV: 91.1 fL (ref 80.0–100.0)
Monocytes Absolute: 0.5 10*3/uL (ref 0.1–1.0)
Monocytes Relative: 6 %
Neutro Abs: 7 10*3/uL (ref 1.7–7.7)
Neutrophils Relative %: 82 %
Platelets: 219 10*3/uL (ref 150–400)
RBC: 3.95 MIL/uL — ABNORMAL LOW (ref 4.22–5.81)
RDW: 14.5 % (ref 11.5–15.5)
WBC: 8.6 10*3/uL (ref 4.0–10.5)
nRBC: 0 % (ref 0.0–0.2)

## 2020-11-28 LAB — FERRITIN: Ferritin: 80 ng/mL (ref 24–336)

## 2020-11-29 ENCOUNTER — Inpatient Hospital Stay (HOSPITAL_BASED_OUTPATIENT_CLINIC_OR_DEPARTMENT_OTHER): Payer: Medicare HMO | Admitting: Oncology

## 2020-11-29 ENCOUNTER — Inpatient Hospital Stay: Payer: Medicare HMO

## 2020-11-29 VITALS — BP 136/59 | HR 87 | Temp 97.9°F | Resp 18 | Wt 181.3 lb

## 2020-11-29 VITALS — BP 119/66 | HR 73

## 2020-11-29 DIAGNOSIS — D508 Other iron deficiency anemias: Secondary | ICD-10-CM

## 2020-11-29 DIAGNOSIS — D509 Iron deficiency anemia, unspecified: Secondary | ICD-10-CM | POA: Diagnosis not present

## 2020-11-29 MED ORDER — SODIUM CHLORIDE 0.9 % IV SOLN
Freq: Once | INTRAVENOUS | Status: AC
  Filled 2020-11-29: qty 250

## 2020-11-29 MED ORDER — SODIUM CHLORIDE 0.9 % IV SOLN: 200.0000 mg | Freq: Once | INTRAVENOUS | Status: DC

## 2020-11-29 MED ORDER — IRON SUCROSE 20 MG/ML IV SOLN
200.0000 mg | Freq: Once | INTRAVENOUS | Status: AC
  Administered 2020-11-29: 200 mg via INTRAVENOUS
  Filled 2020-11-29: qty 10

## 2020-11-29 NOTE — Progress Notes (Signed)
Pt reports increased fatigue in the afternoon vs early morning. Endorses mild SOB with exertion. No other concerns/complaints at this time.

## 2020-11-29 NOTE — Patient Instructions (Signed)

## 2020-11-29 NOTE — Progress Notes (Signed)
[place]  Telephone:(336) [phone] Fax:(336) [phone]  ID: [name] OB: [birth year]  MR#: [id number]  CSN#:[id number]  Patient Care Team: Juluis Pitch, MD as PCP - General (Family Medicine) Efrain Sella, MD as Consulting Physician (Gastroenterology) Lloyd Huger, MD as Consulting Physician (Oncology)  CHIEF COMPLAINT: Iron deficiency anemia.  HPI - Mr. Roblyer is a 70 year old male with past medical history significant for CAD, CHF, hypertension, diabetes type 2, obesity, osteoarthritis of right hip, renal insufficiency, hyperlipidemia and melanoma who is followed by Dr. Grayland Ormond for iron deficiency anemia.  He receives intermittent Feraheme last given on 10/25/2019.  INTERVAL HISTORY: In the interim he has done well.  Denies any recent hospitalizations.  He has been seen by dermatology for new skin lesions given history of melanoma.  Lesions appeared benign and she recommended close watch and sunscreen.  States he remains active and is outside most of the day.  Has felt slightly more fatigued over the past 2 months and had to rest mid afternoon.  Unsure if it is related to his iron deficiency versus the humidity and heat.  REVIEW OF SYSTEMS:   Review of Systems  Constitutional:  Positive for malaise/fatigue.  All other systems reviewed and are negative.  As per HPI. Otherwise, a complete review of systems is negative.  PAST MEDICAL HISTORY: Past Medical History:  Diagnosis Date   Arthritis    CHF (congestive heart failure), NYHA class II, acute on chronic, systolic (HCC)    EF [id number]   Coronary artery disease    [facility]   Diabetes mellitus without complication ([place])    Dysplastic nevus 08/26/2020   L flank, moderate atypia   Hyperlipidemia    Hypertension    Melanoma ([place]) 08/20/2015   Right neck. Superficial spreading, arising in nevus. Tumor thickness 1.61m, Anatomic level IV   [place] in [place]  05/22/2020   R lateral neck ant to scar, exc 05/22/20   Numbness in right leg    due to back   Skin cancer    removed l arm   Spinal stenosis    Stented coronary artery 2000   X 1 STENT   Swelling of both lower extremities     PAST SURGICAL HISTORY: Past Surgical History:  Procedure Laterality Date   CATARACT EXTRACTION W/PHACO Right 05/03/2019   Procedure: CATARACT EXTRACTION PHACO AND INTRAOCULAR LENS PLACEMENT (IOC) RIGHT DIABETIC 6.44 00:58.4 11.0%;  Surgeon: BLeandrew Koyanagi MD;  Location[facility]  Service: Ophthalmology;  Laterality: Right;  DIABETIC   CATARACT EXTRACTION W/PHACO Left 06/28/2019   Procedure: CATARACT EXTRACTION PHACO AND INTRAOCULAR LENS PLACEMENT (IOC) LEFT DIABETIC 7.99  01:13.4  10.9% ;  Surgeon: BLeandrew Koyanagi MD;  [facility]  Service: Ophthalmology;  Laterality: Left;  Diabetic - insulin and oral meds   CHOLECYSTECTOMY     JOINT REPLACEMENT     RT KNEE   LUMBAR LAMINECTOMY/DECOMPRESSION MICRODISCECTOMY  04/13/2012   Procedure: LUMBAR LAMINECTOMY/DECOMPRESSION MICRODISCECTOMY 1 LEVEL;  Surgeon: JJohnn Hai MD;  [facility];  Service: Orthopedics;  Laterality: Bilateral;  L4-L5   UVULECTOMY  1990's    FAMILY HISTORY: No family history on file.  ADVANCED DIRECTIVES (Y/N):  N  HEALTH MAINTENANCE: Social History   Tobacco Use   Smoking status: Never   Smokeless tobacco: Never  Vaping Use   Vaping Use: Never used  Substance Use Topics   Alcohol use: Yes    Comment: rare   Drug use:  No     Colonoscopy:  PAP:  Bone density:  Lipid panel:  Allergies  Allergen Reactions   Metoprolol Other (See Comments)    Cant remember   Toprol Xl [Metoprolol Tartrate] Other (See Comments)    Cant remember   Buprenorphine Hcl Itching and Rash   Morphine Itching and Other (See Comments)   Morphine And Related Itching and Rash   Triamcinolone Acetonide Rash    Current Outpatient Medications  Medication  Sig Dispense Refill   amLODipine (NORVASC) 5 MG tablet Take 5 mg by mouth daily.     aspirin 325 MG tablet Take 1 tablet (325 mg total) by mouth daily.     atorvastatin (LIPITOR) 80 MG tablet Take 80 mg by mouth daily.     Continuous Blood Gluc Receiver (DEXCOM G6 RECEIVER) DEVI Use to monitor blood sugar. [place] [phone]     Continuous Blood Gluc Sensor (DEXCOM G6 SENSOR) MISC Use to monitor blood sugar.  Replace every 10 days. [place] [phone].     Continuous Blood Gluc Transmit (DEXCOM G6 TRANSMITTER) MISC Use to monitor blood sugar.  Replace every 3 months. [place] [phone].     FEROSUL 325 (65 Fe) MG tablet Take 1 tablet (325 mg total) by mouth 2 (two) times daily. 180 tablet 1   furosemide (LASIX) 20 MG tablet Take by mouth.     insulin aspart (NOVOLOG) 100 UNIT/ML injection Inject into the skin 3 (three) times daily before meals.     insulin glargine (LANTUS) 100 UNIT/ML injection Inject 44 Units into the skin daily.     isosorbide mononitrate (IMDUR) 30 MG 24 hr tablet Take by mouth.     LANTUS SOLOSTAR 100 UNIT/ML Solostar Pen SMARTSIG:44 Unit(s) SUB-Q Every Night     losartan (COZAAR) 100 MG tablet Take by mouth.     metFORMIN (GLUCOPHAGE) 1000 MG tablet Take 1,000 mg by mouth 2 (two) times daily with a meal.     mupirocin ointment (BACTROBAN) 2 % Apply QD with bandage change 22 g 0   naproxen sodium (ANAPROX) 220 MG tablet Take 220 mg by mouth 2 (two) times daily as needed.      No current facility-administered medications for this visit.    OBJECTIVE: There were no vitals filed for this visit.    There is no height or weight on file to calculate BMI.    ECOG FS:0 - Asymptomatic  Physical Exam Constitutional:      Appearance: Normal appearance.  HENT:     Head: Normocephalic and atraumatic.  Eyes:     Pupils: Pupils are equal, round, and reactive to light.  Cardiovascular:     Rate and Rhythm: Normal rate and regular rhythm.     Heart sounds: Normal heart sounds. No  murmur heard. Pulmonary:     Effort: Pulmonary effort is normal.     Breath sounds: Normal breath sounds. No wheezing.  Abdominal:     General: Bowel sounds are normal. There is no distension.     Palpations: Abdomen is soft.     Tenderness: There is no abdominal tenderness.  Musculoskeletal:        General: Normal range of motion.     Cervical back: Normal range of motion.  Skin:    General: Skin is warm and dry.     Findings: No rash.  Neurological:     Mental Status: He is alert and oriented to person, place, and time.  Psychiatric:        Judgment:  Judgment normal.    LAB RESULTS:  Lab Results  Component Value Date   NA 136 08/26/2020   K 4.2 08/26/2020   CL 104 08/26/2020   CO2 23 08/26/2020   GLUCOSE 358 (H) 08/26/2020   BUN 26 (H) 08/26/2020   CREATININE 0.97 08/26/2020   CALCIUM 8.8 (L) 08/26/2020   PROT 6.9 08/26/2020   ALBUMIN 3.8 08/26/2020   AST 19 08/26/2020   ALT 18 08/26/2020   ALKPHOS 72 08/26/2020   BILITOT 1.1 08/26/2020   GFRNONAA >60 08/26/2020   GFRAA >60 10/15/2019    Lab Results  Component Value Date   WBC 8.6 11/28/2020   NEUTROABS 7.0 11/28/2020   HGB 11.8 (L) 11/28/2020   HCT 36.0 (L) 11/28/2020   MCV 91.1 11/28/2020   PLT 219 11/28/2020   Lab Results  Component Value Date   IRON 51 11/28/2020   TIBC 318 11/28/2020   IRONPCTSAT 16 (L) 11/28/2020   Lab Results  Component Value Date   FERRITIN 80 11/28/2020     STUDIES: No results found.  ASSESSMENT: Iron deficiency anemia.  PLAN:   1.  Iron deficiency anemia:  Unclear etiology of anemia.  Has not required IV Feraheme in quite some time.  Has not had work-up by GI but lab work has been stable.  He does have history of hemoptysis back in August 2021.  CTA was negative for PE but positive for pneumonia.  Denies any additional bleeding.  Lab work from 11/28/2020 shows iron saturations of 16% and a ferritin of 80.  Hemoglobin is 11.8.  Patient having symptoms of fatigue more so  than usual.  Recommend 1 dose of IV Feraheme today given anemia and low iron saturations. He will then return to clinic in 4 months for repeat labs and assessment.  Disposition: IV iron today. RTC in 4 months for lab work (CBC, CMP, iron, ferritin), MD assessment and possible IV Feraheme.  I spent 15 minutes dedicated to the care of this patient (face-to-face and non-face-to-face) on the date of the encounter to include what is described in the assessment and plan.  Patient expressed understanding and was in agreement with this plan. He also understands that He can call clinic at any time with any questions, concerns, or complaints.    Jacquelin Hawking, NP   11/29/2020 9:58 AM

## 2020-12-06 DIAGNOSIS — E1165 Type 2 diabetes mellitus with hyperglycemia: Secondary | ICD-10-CM | POA: Diagnosis not present

## 2020-12-25 DIAGNOSIS — R809 Proteinuria, unspecified: Secondary | ICD-10-CM | POA: Diagnosis not present

## 2020-12-25 DIAGNOSIS — E1129 Type 2 diabetes mellitus with other diabetic kidney complication: Secondary | ICD-10-CM | POA: Diagnosis not present

## 2020-12-25 DIAGNOSIS — E1169 Type 2 diabetes mellitus with other specified complication: Secondary | ICD-10-CM | POA: Diagnosis not present

## 2020-12-25 DIAGNOSIS — I10 Essential (primary) hypertension: Secondary | ICD-10-CM | POA: Diagnosis not present

## 2020-12-25 DIAGNOSIS — E669 Obesity, unspecified: Secondary | ICD-10-CM | POA: Diagnosis not present

## 2020-12-25 DIAGNOSIS — E113293 Type 2 diabetes mellitus with mild nonproliferative diabetic retinopathy without macular edema, bilateral: Secondary | ICD-10-CM | POA: Diagnosis not present

## 2020-12-25 DIAGNOSIS — Z794 Long term (current) use of insulin: Secondary | ICD-10-CM | POA: Diagnosis not present

## 2020-12-25 DIAGNOSIS — E1159 Type 2 diabetes mellitus with other circulatory complications: Secondary | ICD-10-CM | POA: Diagnosis not present

## 2021-02-03 ENCOUNTER — Telehealth: Payer: Self-pay | Admitting: Oncology

## 2021-02-03 NOTE — Telephone Encounter (Signed)
Pt called wanting to make an appt. Please call back at [phone]

## 2021-02-04 DIAGNOSIS — E113293 Type 2 diabetes mellitus with mild nonproliferative diabetic retinopathy without macular edema, bilateral: Secondary | ICD-10-CM | POA: Diagnosis not present

## 2021-02-14 NOTE — Progress Notes (Signed)
[place]  Telephone:(336) [phone] Fax:(336) [phone]  ID: [name] OB: 26-Aug-1950  MR#: [id number]  CSN#:[id number]  Patient Care Team: Juluis Pitch, MD as PCP - General (Family Medicine) Efrain Sella, MD as Consulting Physician (Gastroenterology) Lloyd Huger, MD as Consulting Physician (Oncology)  CHIEF COMPLAINT: Iron deficiency anemia.  INTERVAL HISTORY: Patient returns to clinic today as an add-on with complaints of increasing weakness and fatigue.  He otherwise feels well.  He has no neurologic complaints.  He denies any fevers.  He has a good appetite and denies weight loss.  He has no chest pain, shortness of breath, cough, or hemoptysis.  He denies any nausea, vomiting, constipation, or diarrhea.  He has no melena or hematochezia.  He has no urinary complaints.  Patient offers no further specific complaints today.  REVIEW OF SYSTEMS:   Review of Systems  Constitutional:  Positive for malaise/fatigue. Negative for fever and weight loss.  Respiratory: Negative.  Negative for cough, hemoptysis and shortness of breath.   Cardiovascular: Negative.  Negative for chest pain and leg swelling.  Gastrointestinal: Negative.  Negative for abdominal pain, blood in stool and melena.  Genitourinary: Negative.  Negative for hematuria.  Musculoskeletal: Negative.  Negative for back pain.  Skin: Negative.  Negative for rash.  Neurological:  Positive for weakness. Negative for dizziness, focal weakness and headaches.  Psychiatric/Behavioral: Negative.  The patient is not nervous/anxious.    As per HPI. Otherwise, a complete review of systems is negative.  PAST MEDICAL HISTORY: Past Medical History:  Diagnosis Date   Arthritis    CHF (congestive heart failure), NYHA class II, acute on chronic, systolic (HCC)    EF 68% 03/2749   [facility]onary [facility] disease    [facility]   Diabetes mellitus without complication ([place])    Dysplastic  nevus 08/26/2020   L flank, moderate atypia   Hyperlipidemia    Hypertension    Melanoma ([place]) 08/20/2015   Right neck. Superficial spreading, arising in nevus. Tumor thickness 1.64mm, Anatomic level IV   Melanoma in situ ([place]) 05/22/2020   R lateral neck ant to scar, exc 05/22/20   Numbness in right leg    due to back   Skin cancer    removed l arm   Spinal stenosis    Stented coronary artery 2000   X 1 STENT   Swelling of both lower extremities     PAST SURGICAL HISTORY: Past Surgical History:  Procedure Laterality Date   CATARACT EXTRACTION W/PHACO Right 05/03/2019   Procedure: CATARACT EXTRACTION PHACO AND INTRAOCULAR LENS PLACEMENT (IOC) RIGHT DIABETIC 6.44 00:58.4 11.0%;  Surgeon: Leandrew Koyanagi, MD;  Location: [facility];  Service: Ophthalmology;  Laterality: Right;  DIABETIC   CATARACT EXTRACTION W/PHACO Left 06/28/2019   Procedure: CATARACT EXTRACTION PHACO AND INTRAOCULAR LENS PLACEMENT (IOC) LEFT DIABETIC 7.99  01:13.4  10.9% ;  Surgeon: Leandrew Koyanagi, MD;  Location: [facility];  Service: Ophthalmology;  Laterality: Left;  Diabetic - insulin and oral meds   CHOLECYSTECTOMY     JOINT REPLACEMENT     RT KNEE   LUMBAR LAMINECTOMY/DECOMPRESSION MICRODISCECTOMY  04/13/2012   Procedure: LUMBAR LAMINECTOMY/DECOMPRESSION MICRODISCECTOMY 1 LEVEL;  Surgeon: Johnn Hai, MD;  [facility];  Service: Orthopedics;  Laterality: Bilateral;  L4-L5   UVULECTOMY  1990's    FAMILY HISTORY: No family history on file.  ADVANCED DIRECTIVES (Y/N):  N  HEALTH MAINTENANCE: Social History   Tobacco Use   Smoking status: Never  Smokeless tobacco: Never  Vaping Use   Vaping Use: Never used  Substance Use Topics   Alcohol use: Yes    Comment: rare   Drug use: No     Colonoscopy:  PAP:  Bone density:  Lipid panel:  Allergies  Allergen Reactions   Metoprolol Other (See Comments)    Cant remember   Toprol Xl [Metoprolol Tartrate] Other (See  Comments)    Cant remember   Buprenorphine Hcl Itching and Rash   Morphine Itching and Other (See Comments)   Morphine And Related Itching and Rash   Triamcinolone Acetonide Rash    Current Outpatient Medications  Medication Sig Dispense Refill   amLODipine (NORVASC) 5 MG tablet Take 5 mg by mouth daily.     aspirin 325 MG tablet Take 1 tablet (325 mg total) by mouth daily.     atorvastatin (LIPITOR) 80 MG tablet Take 80 mg by mouth daily.     Continuous Blood Gluc Receiver (DEXCOM G6 RECEIVER) DEVI Use to monitor blood sugar. [place] [phone]     Continuous Blood Gluc Sensor (DEXCOM G6 SENSOR) MISC Use to monitor blood sugar.  Replace every 10 days. [place] [phone].     Continuous Blood Gluc Transmit (DEXCOM G6 TRANSMITTER) MISC Use to monitor blood sugar.  Replace every 3 months. [place] [phone].     FEROSUL 325 (65 Fe) MG tablet Take 1 tablet (325 mg total) by mouth 2 (two) times daily. 180 tablet 1   furosemide (LASIX) 20 MG tablet Take by mouth.     insulin aspart (NOVOLOG) 100 UNIT/ML injection Inject into the skin 3 (three) times daily before meals.     insulin glargine (LANTUS) 100 UNIT/ML injection Inject 44 Units into the skin daily.     LANTUS SOLOSTAR 100 UNIT/ML Solostar Pen SMARTSIG:44 Unit(s) SUB-Q Every Night     losartan (COZAAR) 100 MG tablet Take by mouth.     metFORMIN (GLUCOPHAGE) 1000 MG tablet Take 1,000 mg by mouth 2 (two) times daily with a meal.     mupirocin ointment (BACTROBAN) 2 % Apply QD with bandage change 22 g 0   naproxen sodium (ANAPROX) 220 MG tablet Take 220 mg by mouth 2 (two) times daily as needed.      isosorbide mononitrate (IMDUR) 30 MG 24 hr tablet Take by mouth.     No current facility-administered medications for this visit.    OBJECTIVE: Vitals:   02/19/21 1001  BP: (!) 155/67  Pulse: 88  Resp: 18  SpO2: 99%     Body mass index is 24.55 kg/m.    ECOG FS:0 - Asymptomatic  General: Well-developed, well-nourished, no acute  distress. Eyes: Pink conjunctiva, anicteric sclera. HEENT: Normocephalic, moist mucous membranes. Lungs: No audible wheezing or coughing. Heart: Regular rate and rhythm. Abdomen: Soft, nontender, no obvious distention. Musculoskeletal: No edema, cyanosis, or clubbing. Neuro: Alert, answering all questions appropriately. Cranial nerves grossly intact. Skin: No rashes or petechiae noted. Psych: Normal affect.   LAB RESULTS:  Lab Results  Component Value Date   NA 136 02/19/2021   K 3.8 02/19/2021   CL 100 02/19/2021   CO2 25 02/19/2021   GLUCOSE 415 (H) 02/19/2021   BUN 11 02/19/2021   CREATININE 0.91 02/19/2021   CALCIUM 8.4 (L) 02/19/2021   PROT 6.2 (L) 02/19/2021   ALBUMIN 3.1 (L) 02/19/2021   AST 15 02/19/2021   ALT 13 02/19/2021   ALKPHOS 93 02/19/2021   BILITOT 1.6 (H) 02/19/2021   GFRNONAA >60 02/19/2021  GFRAA >60 10/15/2019    Lab Results  Component Value Date   WBC 7.7 02/19/2021   NEUTROABS 6.1 02/19/2021   HGB 10.7 (L) 02/19/2021   HCT 33.4 (L) 02/19/2021   MCV 88.4 02/19/2021   PLT 213 02/19/2021   Lab Results  Component Value Date   IRON 60 02/19/2021   TIBC 249 (L) 02/19/2021   IRONPCTSAT 24 02/19/2021   Lab Results  Component Value Date   FERRITIN 175 02/19/2021     STUDIES: No results found.  ASSESSMENT: Iron deficiency anemia.  PLAN:   1.  Iron deficiency anemia: Although patient's iron stores continue to be within normal limits, his hemoglobin has trended down and he is symptomatic.  Proceed with 200 mg IV Venofer tomorrow.  And return to clinic next week for an additional infusion.  He has been instructed to continue to follow-up with GI as scheduled.  Cancel previously scheduled appointment in January 2023 and rescheduled for 3 months.   I spent a total of 30 minutes reviewing chart data, face-to-face evaluation with the patient, counseling and coordination of care as detailed above.  Patient expressed understanding and was in  agreement with this plan. He also understands that He can call clinic at any time with any questions, concerns, or complaints.    Lloyd Huger, MD   02/20/2021 9:13 AM

## 2021-02-19 ENCOUNTER — Inpatient Hospital Stay: Payer: Medicare HMO | Attending: Oncology

## 2021-02-19 ENCOUNTER — Inpatient Hospital Stay (HOSPITAL_BASED_OUTPATIENT_CLINIC_OR_DEPARTMENT_OTHER): Payer: Medicare HMO | Admitting: Oncology

## 2021-02-19 ENCOUNTER — Other Ambulatory Visit: Payer: Self-pay

## 2021-02-19 VITALS — BP 155/67 | HR 88 | Resp 18 | Wt 181.0 lb

## 2021-02-19 DIAGNOSIS — D509 Iron deficiency anemia, unspecified: Secondary | ICD-10-CM

## 2021-02-19 DIAGNOSIS — D508 Other iron deficiency anemias: Secondary | ICD-10-CM

## 2021-02-19 LAB — COMPREHENSIVE METABOLIC PANEL
ALT: 13 U/L (ref 0–44)
AST: 15 U/L (ref 15–41)
Albumin: 3.1 g/dL — ABNORMAL LOW (ref 3.5–5.0)
Alkaline Phosphatase: 93 U/L (ref 38–126)
Anion gap: 11 (ref 5–15)
BUN: 11 mg/dL (ref 8–23)
CO2: 25 mmol/L (ref 22–32)
Calcium: 8.4 mg/dL — ABNORMAL LOW (ref 8.9–10.3)
Chloride: 100 mmol/L (ref 98–111)
Creatinine, Ser: 0.91 mg/dL (ref 0.61–1.24)
GFR, Estimated: >60 mL/min (ref >60)
Glucose, Bld: 415 mg/dL — ABNORMAL HIGH (ref 70–99)
Potassium: 3.8 mmol/L (ref 3.5–5.1)
Sodium: 136 mmol/L (ref 135–145)
Total Bilirubin: 1.6 mg/dL — ABNORMAL HIGH (ref 0.3–1.2)
Total Protein: 6.2 g/dL — ABNORMAL LOW (ref 6.5–8.1)

## 2021-02-19 LAB — IRON AND TIBC
Iron: 60 ug/dL (ref 45–182)
Saturation Ratios: 24 % (ref 17.9–39.5)
TIBC: 249 ug/dL — ABNORMAL LOW (ref 250–450)
UIBC: 189 ug/dL

## 2021-02-19 LAB — CBC WITH DIFFERENTIAL/PLATELET
Abs Immature Granulocytes: 0.05 10*3/uL (ref 0.00–0.07)
Basophils Absolute: 0 10*3/uL (ref 0.0–0.1)
Basophils Relative: 0 %
Eosinophils Absolute: 0.1 10*3/uL (ref 0.0–0.5)
Eosinophils Relative: 1 %
HCT: 33.4 % — ABNORMAL LOW (ref 39.0–52.0)
Hemoglobin: 10.7 g/dL — ABNORMAL LOW (ref 13.0–17.0)
Immature Granulocytes: 1 %
Lymphocytes Relative: 13 %
Lymphs Abs: 1 10*3/uL (ref 0.7–4.0)
MCH: 28.3 pg (ref 26.0–34.0)
MCHC: 32 g/dL (ref 30.0–36.0)
MCV: 88.4 fL (ref 80.0–100.0)
Monocytes Absolute: 0.4 10*3/uL (ref 0.1–1.0)
Monocytes Relative: 5 %
Neutro Abs: 6.1 10*3/uL (ref 1.7–7.7)
Neutrophils Relative %: 80 %
Platelets: 213 10*3/uL (ref 150–400)
RBC: 3.78 MIL/uL — ABNORMAL LOW (ref 4.22–5.81)
RDW: 14.9 % (ref 11.5–15.5)
WBC: 7.7 10*3/uL (ref 4.0–10.5)
nRBC: 0 % (ref 0.0–0.2)

## 2021-02-19 LAB — FERRITIN: Ferritin: 175 ng/mL (ref 24–336)

## 2021-02-19 NOTE — Progress Notes (Signed)
Pt c/o weakness x1 month and intermittent productive cough x 52month. No other concerns at this time.

## 2021-02-20 ENCOUNTER — Encounter: Payer: Self-pay | Admitting: Oncology

## 2021-02-20 ENCOUNTER — Inpatient Hospital Stay: Payer: Medicare HMO

## 2021-02-20 VITALS — BP 145/55 | HR 83 | Temp 97.7°F | Resp 16

## 2021-02-20 DIAGNOSIS — D509 Iron deficiency anemia, unspecified: Secondary | ICD-10-CM | POA: Diagnosis not present

## 2021-02-20 MED ORDER — SODIUM CHLORIDE 0.9 % IV SOLN: 200.0000 mg | Freq: Once | INTRAVENOUS | Status: DC

## 2021-02-20 MED ORDER — SODIUM CHLORIDE 0.9 % IV SOLN
Freq: Once | INTRAVENOUS | Status: AC
Start: 2021-02-20 — End: 2021-02-20
  Filled 2021-02-20: qty 250

## 2021-02-20 MED ORDER — IRON SUCROSE 20 MG/ML IV SOLN
200.0000 mg | Freq: Once | INTRAVENOUS | Status: AC
  Administered 2021-02-20: 200 mg via INTRAVENOUS
  Filled 2021-02-20: qty 10

## 2021-02-20 NOTE — Patient Instructions (Signed)

## 2021-02-24 ENCOUNTER — Other Ambulatory Visit: Payer: Self-pay

## 2021-02-24 ENCOUNTER — Inpatient Hospital Stay: Payer: Medicare HMO

## 2021-02-24 VITALS — BP 167/63 | HR 96 | Temp 97.8°F | Resp 20

## 2021-02-24 DIAGNOSIS — D509 Iron deficiency anemia, unspecified: Secondary | ICD-10-CM

## 2021-02-24 DIAGNOSIS — E1165 Type 2 diabetes mellitus with hyperglycemia: Secondary | ICD-10-CM | POA: Diagnosis not present

## 2021-02-24 MED ORDER — SODIUM CHLORIDE 0.9 % IV SOLN: 200.0000 mg | Freq: Once | INTRAVENOUS | Status: DC

## 2021-02-24 MED ORDER — IRON SUCROSE 20 MG/ML IV SOLN
200.0000 mg | Freq: Once | INTRAVENOUS | Status: AC
  Administered 2021-02-24: 200 mg via INTRAVENOUS
  Filled 2021-02-24: qty 10

## 2021-02-24 MED ORDER — SODIUM CHLORIDE 0.9 % IV SOLN
Freq: Once | INTRAVENOUS | Status: AC
  Filled 2021-02-24: qty 250

## 2021-02-24 NOTE — Patient Instructions (Signed)

## 2021-02-25 ENCOUNTER — Ambulatory Visit: Payer: Medicare HMO | Admitting: Dermatology

## 2021-02-25 DIAGNOSIS — Z85820 Personal history of malignant melanoma of skin: Secondary | ICD-10-CM | POA: Diagnosis not present

## 2021-02-25 DIAGNOSIS — L820 Inflamed seborrheic keratosis: Secondary | ICD-10-CM

## 2021-02-25 DIAGNOSIS — Z86018 Personal history of other benign neoplasm: Secondary | ICD-10-CM | POA: Diagnosis not present

## 2021-02-25 DIAGNOSIS — Z86006 Personal history of melanoma in-situ: Secondary | ICD-10-CM | POA: Diagnosis not present

## 2021-02-25 DIAGNOSIS — L814 Other melanin hyperpigmentation: Secondary | ICD-10-CM

## 2021-02-25 DIAGNOSIS — D1800 Hemangioma unspecified site: Secondary | ICD-10-CM

## 2021-02-25 DIAGNOSIS — L821 Other seborrheic keratosis: Secondary | ICD-10-CM

## 2021-02-25 DIAGNOSIS — Z1283 Encounter for screening for malignant neoplasm of skin: Secondary | ICD-10-CM

## 2021-02-25 DIAGNOSIS — D229 Melanocytic nevi, unspecified: Secondary | ICD-10-CM | POA: Diagnosis not present

## 2021-02-25 DIAGNOSIS — L578 Other skin changes due to chronic exposure to nonionizing radiation: Secondary | ICD-10-CM | POA: Diagnosis not present

## 2021-02-25 NOTE — Progress Notes (Signed)
Follow-Up Visit   Subjective  [name] is a 70 y.o. male who presents for the following: Follow-up.  Patient presents for 6 month follow-up and UBSE. The patient has spots, moles and lesions to be evaluated, some may be new or changing and the patient has concerns that these could be cancer. He has a history of melanoma, dysplastic nevi, and AKs.  He has a spot on his right neck that he hits with shaving. Also some other growths that get irritated on neck area.   The following portions of the chart were reviewed this encounter and updated as appropriate:       Review of Systems:  No other skin or systemic complaints except as noted in HPI or Assessment and Plan.  Objective  Well appearing patient in no apparent distress; mood and affect are within normal limits.  All skin waist up examined.  R lateral neck anterior to scar Well healed scar with no evidence of recurrence.   R neck Well healed scar with no evidence of recurrence  Left Flank Scar with no evidence of recurrence.   L postauricular x 1, R preauricular x 1, R lower neck x 1, L elbow x 1, posterior neck x 2 (6) Erythematous keratotic or waxy stuck-on papule or plaque.    Assessment & Plan  Skin cancer screening performed today.  Actinic Damage - chronic, secondary to cumulative UV radiation exposure/sun exposure over time - diffuse scaly erythematous macules with underlying dyspigmentation - Recommend daily broad spectrum sunscreen SPF 30+ to sun-exposed areas, reapply every 2 hours as needed.  - Recommend staying in the shade or wearing long sleeves, sun glasses (UVA+UVB protection) and wide brim hats (4-inch brim around the entire circumference of the hat). - Call for new or changing lesions.  Lentigines - Scattered tan macules - Due to sun exposure - Benign-appering, observe - Recommend daily broad spectrum sunscreen SPF 30+ to sun-exposed areas, reapply every 2 hours as needed. - Call for any  changes  Seborrheic Keratoses - Stuck-on, waxy, tan-brown papules and/or plaques  - Benign-appearing - Discussed benign etiology and prognosis. - Observe - Call for any changes  Melanocytic Nevi - Tan-brown and/or pink-flesh-colored symmetric macules and papules - Benign appearing on exam today - Observation - Call clinic for new or changing moles - Recommend daily use of broad spectrum spf 30+ sunscreen to sun-exposed areas.   History of melanoma in situ R lateral neck anterior to scar  05/2020 Clear. Observe for recurrence. Call clinic for new or changing lesions.  Recommend regular skin exams, daily broad-spectrum spf 30+ sunscreen use, and photoprotection.    History of melanoma R neck  08/2015 Clear. Observe for recurrence. Call clinic for new or changing lesions.  Recommend regular skin exams, daily broad-spectrum spf 30+ sunscreen use, and photoprotection.    History of dysplastic nevus Left Flank  Clear. Observe for recurrence. Call clinic for new or changing lesions.  Recommend regular skin exams, daily broad-spectrum spf 30+ sunscreen use, and photoprotection.    Inflamed seborrheic keratosis L postauricular x 1, R preauricular x 1, R lower neck x 1, L elbow x 1, posterior neck x 2  Recheck L elbow and L postauricular on f/u (no improvement with topical)  Destruction of lesion - L postauricular x 1, R preauricular x 1, R lower neck x 1, L elbow x 1, posterior neck x 2  Destruction method: cryotherapy   Informed consent: discussed and consent obtained   Lesion destroyed  using liquid nitrogen: Yes   Region frozen until ice ball extended beyond lesion: Yes   Outcome: patient tolerated procedure well with no complications   Post-procedure details: wound care instructions given   Additional details:  Prior to procedure, discussed risks of blister formation, small wound, skin dyspigmentation, or rare scar following cryotherapy. Recommend Vaseline ointment to treated  areas while healing.    Return in about 6 months (around 08/26/2021) for UBSE.  IJamesetta Orleans, CMA, am acting as scribe for Brendolyn Patty, MD . Documentation: I have reviewed the above documentation for accuracy and completeness, and I agree with the above.  Brendolyn Patty MD

## 2021-02-25 NOTE — Patient Instructions (Addendum)

## 2021-02-27 ENCOUNTER — Other Ambulatory Visit: Payer: Self-pay

## 2021-02-27 ENCOUNTER — Inpatient Hospital Stay: Payer: Medicare HMO

## 2021-02-27 VITALS — BP 156/61 | HR 94 | Temp 97.3°F

## 2021-02-27 DIAGNOSIS — D509 Iron deficiency anemia, unspecified: Secondary | ICD-10-CM | POA: Diagnosis not present

## 2021-02-27 MED ORDER — IRON SUCROSE 20 MG/ML IV SOLN
200.0000 mg | Freq: Once | INTRAVENOUS | Status: AC
  Administered 2021-02-27: 200 mg via INTRAVENOUS
  Filled 2021-02-27: qty 10

## 2021-02-27 MED ORDER — SODIUM CHLORIDE 0.9 % IV SOLN: 200.0000 mg | Freq: Once | INTRAVENOUS | Status: DC

## 2021-02-27 MED ORDER — SODIUM CHLORIDE 0.9 % IV SOLN
Freq: Once | INTRAVENOUS | Status: AC
  Filled 2021-02-27: qty 250

## 2021-02-27 NOTE — Patient Instructions (Signed)

## 2021-03-11 DIAGNOSIS — E1165 Type 2 diabetes mellitus with hyperglycemia: Secondary | ICD-10-CM | POA: Diagnosis not present

## 2021-03-18 DIAGNOSIS — E669 Obesity, unspecified: Secondary | ICD-10-CM | POA: Diagnosis not present

## 2021-03-18 DIAGNOSIS — E1142 Type 2 diabetes mellitus with diabetic polyneuropathy: Secondary | ICD-10-CM | POA: Diagnosis not present

## 2021-03-18 DIAGNOSIS — N289 Disorder of kidney and ureter, unspecified: Secondary | ICD-10-CM | POA: Diagnosis not present

## 2021-03-18 DIAGNOSIS — E1169 Type 2 diabetes mellitus with other specified complication: Secondary | ICD-10-CM | POA: Diagnosis not present

## 2021-03-18 DIAGNOSIS — I10 Essential (primary) hypertension: Secondary | ICD-10-CM | POA: Diagnosis not present

## 2021-03-18 DIAGNOSIS — Z125 Encounter for screening for malignant neoplasm of prostate: Secondary | ICD-10-CM | POA: Diagnosis not present

## 2021-03-18 DIAGNOSIS — I110 Hypertensive heart disease with heart failure: Secondary | ICD-10-CM | POA: Diagnosis not present

## 2021-03-18 DIAGNOSIS — E782 Mixed hyperlipidemia: Secondary | ICD-10-CM | POA: Diagnosis not present

## 2021-03-18 DIAGNOSIS — Z794 Long term (current) use of insulin: Secondary | ICD-10-CM | POA: Diagnosis not present

## 2021-03-18 DIAGNOSIS — R350 Frequency of micturition: Secondary | ICD-10-CM | POA: Diagnosis not present

## 2021-03-31 ENCOUNTER — Ambulatory Visit: Payer: Medicare HMO | Admitting: Oncology

## 2021-03-31 ENCOUNTER — Other Ambulatory Visit: Payer: Medicare HMO

## 2021-03-31 ENCOUNTER — Ambulatory Visit: Payer: Medicare HMO

## 2021-04-01 ENCOUNTER — Ambulatory Visit: Payer: Medicare HMO | Admitting: Oncology

## 2021-04-01 ENCOUNTER — Other Ambulatory Visit: Payer: Medicare HMO

## 2021-04-01 ENCOUNTER — Ambulatory Visit: Payer: Medicare HMO

## 2021-04-07 DIAGNOSIS — Z794 Long term (current) use of insulin: Secondary | ICD-10-CM | POA: Diagnosis not present

## 2021-04-07 DIAGNOSIS — E1129 Type 2 diabetes mellitus with other diabetic kidney complication: Secondary | ICD-10-CM | POA: Diagnosis not present

## 2021-04-07 DIAGNOSIS — R809 Proteinuria, unspecified: Secondary | ICD-10-CM | POA: Diagnosis not present

## 2021-04-07 DIAGNOSIS — E113293 Type 2 diabetes mellitus with mild nonproliferative diabetic retinopathy without macular edema, bilateral: Secondary | ICD-10-CM | POA: Diagnosis not present

## 2021-04-07 DIAGNOSIS — E1169 Type 2 diabetes mellitus with other specified complication: Secondary | ICD-10-CM | POA: Diagnosis not present

## 2021-04-07 DIAGNOSIS — E1159 Type 2 diabetes mellitus with other circulatory complications: Secondary | ICD-10-CM | POA: Diagnosis not present

## 2021-04-07 DIAGNOSIS — E669 Obesity, unspecified: Secondary | ICD-10-CM | POA: Diagnosis not present

## 2021-04-30 DIAGNOSIS — R600 Localized edema: Secondary | ICD-10-CM | POA: Diagnosis not present

## 2021-04-30 DIAGNOSIS — E1159 Type 2 diabetes mellitus with other circulatory complications: Secondary | ICD-10-CM | POA: Diagnosis not present

## 2021-04-30 DIAGNOSIS — E782 Mixed hyperlipidemia: Secondary | ICD-10-CM | POA: Diagnosis not present

## 2021-04-30 DIAGNOSIS — R0602 Shortness of breath: Secondary | ICD-10-CM | POA: Diagnosis not present

## 2021-04-30 DIAGNOSIS — I10 Essential (primary) hypertension: Secondary | ICD-10-CM | POA: Diagnosis not present

## 2021-04-30 DIAGNOSIS — I6523 Occlusion and stenosis of bilateral carotid arteries: Secondary | ICD-10-CM | POA: Diagnosis not present

## 2021-04-30 DIAGNOSIS — I5022 Chronic systolic (congestive) heart failure: Secondary | ICD-10-CM | POA: Diagnosis not present

## 2021-04-30 DIAGNOSIS — I700 Atherosclerosis of aorta: Secondary | ICD-10-CM | POA: Diagnosis not present

## 2021-04-30 DIAGNOSIS — I25118 Atherosclerotic heart disease of native coronary artery with other forms of angina pectoris: Secondary | ICD-10-CM | POA: Diagnosis not present

## 2021-05-05 ENCOUNTER — Other Ambulatory Visit: Payer: Self-pay | Admitting: *Deleted

## 2021-05-05 DIAGNOSIS — D508 Other iron deficiency anemias: Secondary | ICD-10-CM

## 2021-05-05 MED ORDER — FEROSUL 325 (65 FE) MG PO TABS: 325.0000 mg | ORAL_TABLET | Freq: Two times a day (BID) | ORAL | 1 refills | Status: DC

## 2021-05-07 ENCOUNTER — Telehealth: Payer: Self-pay | Admitting: Oncology

## 2021-05-07 NOTE — Telephone Encounter (Signed)
Received notification from clinical team to get the patient scheduled for lab only. Pt did not answer. Message left to contact office for scheduling.

## 2021-05-13 ENCOUNTER — Other Ambulatory Visit: Payer: Self-pay | Admitting: Emergency Medicine

## 2021-05-13 ENCOUNTER — Encounter: Payer: Self-pay | Admitting: Oncology

## 2021-05-13 DIAGNOSIS — D509 Iron deficiency anemia, unspecified: Secondary | ICD-10-CM

## 2021-05-14 ENCOUNTER — Other Ambulatory Visit: Payer: Self-pay

## 2021-05-14 ENCOUNTER — Inpatient Hospital Stay: Payer: Medicare HMO | Attending: Oncology

## 2021-05-14 DIAGNOSIS — Z79899 Other long term (current) drug therapy: Secondary | ICD-10-CM | POA: Diagnosis not present

## 2021-05-14 DIAGNOSIS — D509 Iron deficiency anemia, unspecified: Secondary | ICD-10-CM | POA: Diagnosis not present

## 2021-05-14 DIAGNOSIS — Z794 Long term (current) use of insulin: Secondary | ICD-10-CM | POA: Diagnosis not present

## 2021-05-14 DIAGNOSIS — E119 Type 2 diabetes mellitus without complications: Secondary | ICD-10-CM | POA: Diagnosis not present

## 2021-05-14 DIAGNOSIS — D649 Anemia, unspecified: Secondary | ICD-10-CM | POA: Diagnosis not present

## 2021-05-14 LAB — CBC WITH DIFFERENTIAL/PLATELET
Abs Immature Granulocytes: 0.03 10*3/uL (ref 0.00–0.07)
Basophils Absolute: 0 10*3/uL (ref 0.0–0.1)
Basophils Relative: 1 %
Eosinophils Absolute: 0.2 10*3/uL (ref 0.0–0.5)
Eosinophils Relative: 3 %
HCT: 30.1 % — ABNORMAL LOW (ref 39.0–52.0)
Hemoglobin: 9.5 g/dL — ABNORMAL LOW (ref 13.0–17.0)
Immature Granulocytes: 1 %
Lymphocytes Relative: 15 %
Lymphs Abs: 0.9 10*3/uL (ref 0.7–4.0)
MCH: 29.4 pg (ref 26.0–34.0)
MCHC: 31.6 g/dL (ref 30.0–36.0)
MCV: 93.2 fL (ref 80.0–100.0)
Monocytes Absolute: 0.4 10*3/uL (ref 0.1–1.0)
Monocytes Relative: 7 %
Neutro Abs: 4.6 10*3/uL (ref 1.7–7.7)
Neutrophils Relative %: 73 %
Platelets: 212 10*3/uL (ref 150–400)
RBC: 3.23 MIL/uL — ABNORMAL LOW (ref 4.22–5.81)
RDW: 14.8 % (ref 11.5–15.5)
WBC: 6.2 10*3/uL (ref 4.0–10.5)
nRBC: 0 % (ref 0.0–0.2)

## 2021-05-14 LAB — FERRITIN: Ferritin: 123 ng/mL (ref 24–336)

## 2021-05-14 LAB — IRON AND TIBC
Iron: 115 ug/dL (ref 45–182)
Saturation Ratios: 39 % (ref 17.9–39.5)
TIBC: 294 ug/dL (ref 250–450)
UIBC: 179 ug/dL

## 2021-05-20 DIAGNOSIS — R0602 Shortness of breath: Secondary | ICD-10-CM | POA: Diagnosis not present

## 2021-05-20 DIAGNOSIS — I25118 Atherosclerotic heart disease of native coronary artery with other forms of angina pectoris: Secondary | ICD-10-CM | POA: Diagnosis not present

## 2021-05-20 DIAGNOSIS — R9431 Abnormal electrocardiogram [ECG] [EKG]: Secondary | ICD-10-CM | POA: Diagnosis not present

## 2021-05-21 DIAGNOSIS — E1165 Type 2 diabetes mellitus with hyperglycemia: Secondary | ICD-10-CM | POA: Diagnosis not present

## 2021-05-26 ENCOUNTER — Telehealth: Payer: Self-pay | Admitting: *Deleted

## 2021-05-26 NOTE — Telephone Encounter (Signed)
Patient has lab appointment on 3/16 he had labs drawn last week does he need to keep the appointment on 3?16. ?

## 2021-05-28 ENCOUNTER — Encounter: Payer: Self-pay | Admitting: Oncology

## 2021-05-29 ENCOUNTER — Other Ambulatory Visit: Payer: Medicare HMO

## 2021-06-02 ENCOUNTER — Encounter: Payer: Self-pay | Admitting: Nurse Practitioner

## 2021-06-02 ENCOUNTER — Inpatient Hospital Stay: Payer: Medicare HMO

## 2021-06-02 ENCOUNTER — Inpatient Hospital Stay: Payer: Medicare HMO | Admitting: Nurse Practitioner

## 2021-06-02 ENCOUNTER — Other Ambulatory Visit: Payer: Self-pay

## 2021-06-02 VITALS — BP 148/59 | HR 81 | Temp 97.7°F | Ht 72.0 in | Wt 291.0 lb

## 2021-06-02 DIAGNOSIS — D509 Iron deficiency anemia, unspecified: Secondary | ICD-10-CM | POA: Diagnosis not present

## 2021-06-02 DIAGNOSIS — Z794 Long term (current) use of insulin: Secondary | ICD-10-CM | POA: Diagnosis not present

## 2021-06-02 DIAGNOSIS — Z79899 Other long term (current) drug therapy: Secondary | ICD-10-CM | POA: Diagnosis not present

## 2021-06-02 DIAGNOSIS — D649 Anemia, unspecified: Secondary | ICD-10-CM

## 2021-06-02 DIAGNOSIS — E119 Type 2 diabetes mellitus without complications: Secondary | ICD-10-CM | POA: Diagnosis not present

## 2021-06-02 LAB — RETIC PANEL
Immature Retic Fract: 31.6 % — ABNORMAL HIGH (ref 2.3–15.9)
RBC.: 3.17 MIL/uL — ABNORMAL LOW (ref 4.22–5.81)
Retic Count, Absolute: 282.4 10*3/uL — ABNORMAL HIGH (ref 19.0–186.0)
Retic Ct Pct: 8.9 % — ABNORMAL HIGH (ref 0.4–3.1)
Reticulocyte Hemoglobin: 29.9 pg (ref >27.9)

## 2021-06-02 LAB — CBC WITH DIFFERENTIAL/PLATELET
Abs Immature Granulocytes: 0.03 10*3/uL (ref 0.00–0.07)
Basophils Absolute: 0.1 10*3/uL (ref 0.0–0.1)
Basophils Relative: 1 %
Eosinophils Absolute: 0.1 10*3/uL (ref 0.0–0.5)
Eosinophils Relative: 2 %
HCT: 30.6 % — ABNORMAL LOW (ref 39.0–52.0)
Hemoglobin: 9.2 g/dL — ABNORMAL LOW (ref 13.0–17.0)
Immature Granulocytes: 0 %
Lymphocytes Relative: 12 %
Lymphs Abs: 0.9 10*3/uL (ref 0.7–4.0)
MCH: 28.5 pg (ref 26.0–34.0)
MCHC: 30.1 g/dL (ref 30.0–36.0)
MCV: 94.7 fL (ref 80.0–100.0)
Monocytes Absolute: 0.4 10*3/uL (ref 0.1–1.0)
Monocytes Relative: 6 %
Neutro Abs: 5.7 10*3/uL (ref 1.7–7.7)
Neutrophils Relative %: 79 %
Platelets: 273 10*3/uL (ref 150–400)
RBC: 3.23 MIL/uL — ABNORMAL LOW (ref 4.22–5.81)
RDW: 15.9 % — ABNORMAL HIGH (ref 11.5–15.5)
WBC: 7.2 10*3/uL (ref 4.0–10.5)
nRBC: 0 % (ref 0.0–0.2)

## 2021-06-02 LAB — COMPREHENSIVE METABOLIC PANEL
ALT: 20 U/L (ref 0–44)
AST: 18 U/L (ref 15–41)
Albumin: 3.5 g/dL (ref 3.5–5.0)
Alkaline Phosphatase: 94 U/L (ref 38–126)
Anion gap: 6 (ref 5–15)
BUN: 18 mg/dL (ref 8–23)
CO2: 29 mmol/L (ref 22–32)
Calcium: 8.7 mg/dL — ABNORMAL LOW (ref 8.9–10.3)
Chloride: 103 mmol/L (ref 98–111)
Creatinine, Ser: 1.01 mg/dL (ref 0.61–1.24)
GFR, Estimated: >60 mL/min (ref >60)
Glucose, Bld: 175 mg/dL — ABNORMAL HIGH (ref 70–99)
Potassium: 3.8 mmol/L (ref 3.5–5.1)
Sodium: 138 mmol/L (ref 135–145)
Total Bilirubin: 1.3 mg/dL — ABNORMAL HIGH (ref 0.3–1.2)
Total Protein: 7.1 g/dL (ref 6.5–8.1)

## 2021-06-02 LAB — TECHNOLOGIST SMEAR REVIEW
Plt Morphology: NORMAL
RBC MORPHOLOGY: NORMAL
WBC MORPHOLOGY: NORMAL

## 2021-06-02 LAB — FOLATE: Folate: 24 ng/mL (ref >5.9)

## 2021-06-02 LAB — SEDIMENTATION RATE: Sed Rate: 67 mm/hr — ABNORMAL HIGH (ref 0–20)

## 2021-06-02 LAB — LACTATE DEHYDROGENASE: LDH: 173 U/L (ref 98–192)

## 2021-06-02 LAB — VITAMIN B12: Vitamin B-12: 720 pg/mL (ref 180–914)

## 2021-06-02 NOTE — Progress Notes (Signed)
C/O weakness. ? ? ?

## 2021-06-02 NOTE — Progress Notes (Signed)
?[name]  ?[phone]) [phone] [phone] ? ?ID: [name] OB: 23-Jan-1951  MR#: [id number]  ONG#:[id number] ? ?Patient Care Team: ?Juluis Pitch, MD as PCP - General (Family Medicine) ?Alice Reichert, Benay Pike, MD as Consulting Physician (Gastroenterology) ?Lloyd Huger, MD as Consulting Physician (Oncology) ? ?CHIEF COMPLAINT: Iron deficiency anemia ? ?INTERVAL HISTORY: Patient is 71 year old male who returns to clinic for follow-up for iron deficiency anemia.  He continues to complain of weakness and fatigue. Energy improved after IV iron but returned. Otherwise feels well.  He has no neurologic complaints.  No fevers.  Appetite is stable and he endorses weight gain.  No chest pain, shortness of breath, cough, hemoptysis.  He denies nausea, vomiting, constipation, diarrhea.  No melena or hematochezia.  No urinary complaints.  No further specific complaints today. ? ?REVIEW OF SYSTEMS:   ?Review of Systems  ?Constitutional:  Positive for malaise/fatigue. Negative for fever and weight loss.  ?Respiratory: Negative.  Negative for cough, hemoptysis and shortness of breath.   ?Cardiovascular: Negative.  Negative for chest pain and leg swelling.  ?Gastrointestinal: Negative.  Negative for abdominal pain, blood in stool and melena.  ?Genitourinary: Negative.  Negative for hematuria.  ?Musculoskeletal: Negative.  Negative for back pain.  ?Skin: Negative.  Negative for rash.  ?Neurological:  Positive for weakness. Negative for dizziness, focal weakness and headaches.  ?Psychiatric/Behavioral: Negative.  The patient is not nervous/anxious.   ?As per HPI. Otherwise, a complete review of systems is negative. ? ?PAST MEDICAL HISTORY: ?Past Medical History:  ?Diagnosis Date  ? Arthritis   ? CHF (congestive heart failure), NYHA class II, acute on chronic, systolic (HCC)   ? EF 40% 07/2018  ? Coronary artery disease   ? [facility]  ? Diabetes mellitus without  complication ([place])   ? Dysplastic nevus 08/26/2020  ? L flank, moderate atypia  ? Hyperlipidemia   ? Hypertension   ? Melanoma ([place]) 08/20/2015  ? Right neck. Superficial spreading, arising in nevus. Tumor thickness 1.52m, Anatomic level IV  ? Melanoma in situ ([facility] [place] 05/22/2020  ? R lateral neck ant to scar, exc 05/22/20  ? Numbness in right leg   ? due to back  ? Skin cancer   ? removed l arm  ? Spinal stenosis   ? Stented coronary artery 2000  ? X 1 STENT  ? Swelling of both lower extremities   ? ? ?PAST SURGICAL HISTORY: ?Past Surgical History:  ?Procedure Laterality Date  ? CATARACT EXTRACTION W/PHACO Right 05/03/2019  ? Procedure: CATARACT EXTRACTION PHACO AND INTRAOCULAR LENS PLACEMENT (IOC) RIGHT DIABETIC 6.44 00:58.4 11.0%;  Surgeon: BLeandrew Koyanagi MD;  [facility]  Service: Ophthalmology;  Laterality: Right;  DIABETIC  ? CATARACT EXTRACTION W/PHACO Left 06/28/2019  ? Procedure: CATARACT EXTRACTION PHACO AND INTRAOCULAR LENS PLACEMENT (IOC) LEFT DIABETIC 7.99  01:13.4  10.9% ;  Surgeon: BLeandrew Koyanagi MD;  [facility]  Service: Ophthalmology;  Laterality: Left;  Diabetic - insulin and oral meds  ? CHOLECYSTECTOMY    ? JOINT REPLACEMENT    ? RT KNEE  ? LUMBAR LAMINECTOMY/DECOMPRESSION MICRODISCECTOMY  04/13/2012  ? Procedure: LUMBAR LAMINECTOMY/DECOMPRESSION MICRODISCECTOMY 1 LEVEL;  Surgeon: JJohnn Hai MD;  [facility];  Service: Orthopedics;  Laterality: Bilateral;  L4-L5  ? UVULECTOMY  1990's  ? ? ?FAMILY HISTORY: ?History reviewed. No pertinent family history. ? ?ADVANCED DIRECTIVES (Y/N):  N ? ?HEALTH MAINTENANCE: ?Social History  ? ?Tobacco Use  ? Smoking status: Never  ?  Smokeless tobacco: Never  ?Vaping Use  ? Vaping Use: Never used  ?Substance Use Topics  ? Alcohol use: Yes  ?  Comment: rare  ? Drug use: No  ? ? ? Colonoscopy: ? PAP: ? Bone density: ? Lipid panel: ? ?Allergies  ?Allergen Reactions  ? Metoprolol Other (See Comments)  ?  Cant  remember  ? Toprol Xl [Metoprolol Tartrate] Other (See Comments)  ?  Cant remember  ? Buprenorphine Hcl Itching and Rash  ? Morphine Itching and Other (See Comments)  ? Morphine And Related Itching and Rash  ? Triamcinolone Acetonide Rash  ? ? ?Current Outpatient Medications  ?Medication Sig Dispense Refill  ? amLODipine (NORVASC) 5 MG tablet Take 5 mg by mouth daily.    ? aspirin 325 MG tablet Take 1 tablet (325 mg total) by mouth daily.    ? atorvastatin (LIPITOR) 80 MG tablet Take 80 mg by mouth daily.    ? Continuous Blood Gluc Receiver (DEXCOM G6 RECEIVER) DEVI Use to monitor blood sugar. [place] [phone]    ? Continuous Blood Gluc Sensor (DEXCOM G6 SENSOR) MISC Use to monitor blood sugar.  Replace every 10 days. [place] [phone].    ? Continuous Blood Gluc Transmit (DEXCOM G6 TRANSMITTER) MISC Use to monitor blood sugar.  Replace every 3 months. NDC [address]-01.    ? FEROSUL 325 (65 Fe) MG tablet Take 1 tablet (325 mg total) by mouth 2 (two) times daily. 180 tablet 1  ? furosemide (LASIX) 20 MG tablet Take by mouth.    ? insulin aspart (NOVOLOG) 100 UNIT/ML injection Inject into the skin 3 (three) times daily before meals.    ? insulin glargine (LANTUS) 100 UNIT/ML injection Inject 44 Units into the skin daily.    ? LANTUS SOLOSTAR 100 UNIT/ML Solostar Pen SMARTSIG:44 Unit(s) SUB-Q Every Night    ? valsartan (DIOVAN) 160 MG tablet Take by mouth.    ? isosorbide mononitrate (IMDUR) 30 MG 24 hr tablet Take by mouth.    ? metFORMIN (GLUCOPHAGE) 1000 MG tablet Take 1,000 mg by mouth 2 (two) times daily with a meal.    ? mupirocin ointment (BACTROBAN) 2 % Apply QD with bandage change 22 g 0  ? naproxen sodium (ANAPROX) 220 MG tablet Take 220 mg by mouth 2 (two) times daily as needed.     ? ?No current facility-administered medications for this visit.  ? ? ?OBJECTIVE: ?Vitals:  ? 06/02/21 1305  ?BP: (!) 148/59  ?Pulse: 81  ?Temp: 97.7 ?F (36.5 ?C)  ?SpO2: 100%  ?   Body mass index is 39.47 kg/m?Marland Kitchen    ECOG FS:1 -  Symptomatic but completely ambulatory ? ?General: Well-developed, well-nourished, no acute distress. Obese. ?Eyes: Pink conjunctiva, anicteric sclera. ?Lungs: Clear to auscultation bilaterally.  No audible wheezing or coughing ?Heart: Regular rate and rhythm.  ?Abdomen: Soft, nontender, nondistended.  ?Musculoskeletal: No edema, cyanosis, or clubbing. ?Neuro: Alert, answering all questions appropriately. Cranial nerves grossly intact. ?Skin: No rashes or petechiae noted. ?Psych: Normal affect. ? ? ?LAB RESULTS: ? ?Lab Results  ?Component Value Date  ? NA 136 02/19/2021  ? K 3.8 02/19/2021  ? CL 100 02/19/2021  ? CO2 25 02/19/2021  ? GLUCOSE 415 (H) 02/19/2021  ? BUN 11 02/19/2021  ? CREATININE 0.91 02/19/2021  ? CALCIUM 8.4 (L) 02/19/2021  ? PROT 6.2 (L) 02/19/2021  ? ALBUMIN 3.1 (L) 02/19/2021  ? AST 15 02/19/2021  ? ALT 13 02/19/2021  ? ALKPHOS 93 02/19/2021  ? BILITOT 1.6 (H)  02/19/2021  ? GFRNONAA >60 02/19/2021  ? GFRAA >60 10/15/2019  ? ? ?Lab Results  ?Component Value Date  ? WBC 6.2 05/14/2021  ? NEUTROABS 4.6 05/14/2021  ? HGB 9.5 (L) 05/14/2021  ? HCT 30.1 (L) 05/14/2021  ? MCV 93.2 05/14/2021  ? PLT 212 05/14/2021  ? ?Lab Results  ?Component Value Date  ? IRON 115 05/14/2021  ? TIBC 294 05/14/2021  ? IRONPCTSAT 39 05/14/2021  ? ?Lab Results  ?Component Value Date  ? FERRITIN 123 05/14/2021  ? ? ? ?STUDIES: ?No results found. ? ?ASSESSMENT: Iron deficiency anemia ? ?PLAN:  ? ?1.  Iron deficiency anemia: s/p venofer x 2. Ferritin now 123. Hemoglobin down trending, baseline in 10-11 range, was 9.5 on 3/1, today 9.2. Iron sat and TIBC are normal. He is symptomatic/fatigue. Hold IV iron. Recommend additional workup.  ?2. Normocytic anemia- additional labs today to evaluate drop in hemoglobin. Cbc, cmp, ldh, haptoglobin, smear, spep, kllc, retic panel, b12, EPO, folate, sed rate.  ?3. Diabetes- uncontrolled. HA1c 8.3. Question anemia of chronic disease.  ? ?Disposition: ?Labs today ?6 weeks labs (cbc, ferritin,  iron studies) ?Day to week later see Dr. Grayland Ormond, +/- venofer  ? ?Patient expressed understanding and was in agreement with this plan. He also understands that He can call clinic at any time with any questions

## 2021-06-03 LAB — KAPPA/LAMBDA LIGHT CHAINS
Kappa free light chain: 45.6 mg/L — ABNORMAL HIGH (ref 3.3–19.4)
Kappa, lambda light chain ratio: 1.92 — ABNORMAL HIGH (ref 0.26–1.65)
Lambda free light chains: 23.8 mg/L (ref 5.7–26.3)

## 2021-06-03 LAB — HAPTOGLOBIN: Haptoglobin: 192 mg/dL (ref 34–355)

## 2021-06-03 LAB — ERYTHROPOIETIN: Erythropoietin: 99.4 m[IU]/mL — ABNORMAL HIGH (ref 2.6–18.5)

## 2021-06-04 LAB — PROTEIN ELECTROPHORESIS, SERUM
A/G Ratio: 1.1 (ref 0.7–1.7)
Albumin ELP: 3.3 g/dL (ref 2.9–4.4)
Alpha-1-Globulin: 0.3 g/dL (ref 0.0–0.4)
Alpha-2-Globulin: 0.7 g/dL (ref 0.4–1.0)
Beta Globulin: 0.9 g/dL (ref 0.7–1.3)
Gamma Globulin: 1.2 g/dL (ref 0.4–1.8)
Globulin, Total: 3.1 g/dL (ref 2.2–3.9)
M-Spike, %: 0.3 g/dL — ABNORMAL HIGH
Total Protein ELP: 6.4 g/dL (ref 6.0–8.5)

## 2021-06-06 DIAGNOSIS — I10 Essential (primary) hypertension: Secondary | ICD-10-CM | POA: Diagnosis not present

## 2021-06-06 DIAGNOSIS — E782 Mixed hyperlipidemia: Secondary | ICD-10-CM | POA: Diagnosis not present

## 2021-06-06 DIAGNOSIS — I25118 Atherosclerotic heart disease of native coronary artery with other forms of angina pectoris: Secondary | ICD-10-CM | POA: Diagnosis not present

## 2021-06-06 DIAGNOSIS — I700 Atherosclerosis of aorta: Secondary | ICD-10-CM | POA: Diagnosis not present

## 2021-06-06 DIAGNOSIS — I5022 Chronic systolic (congestive) heart failure: Secondary | ICD-10-CM | POA: Diagnosis not present

## 2021-06-06 DIAGNOSIS — E669 Obesity, unspecified: Secondary | ICD-10-CM | POA: Diagnosis not present

## 2021-06-06 DIAGNOSIS — E1169 Type 2 diabetes mellitus with other specified complication: Secondary | ICD-10-CM | POA: Diagnosis not present

## 2021-06-19 DIAGNOSIS — I5022 Chronic systolic (congestive) heart failure: Secondary | ICD-10-CM | POA: Diagnosis not present

## 2021-07-11 NOTE — Progress Notes (Signed)
?[place]  ?Telephone:([phone]) [phone] [phone] ? ?ID: [name] OB: 1950-06-19  MR#: [id number]  CSN#:[id number] ? ?Patient Care Team: ?Juluis Pitch, MD as PCP - General (Family Medicine) ?Alice Reichert, Benay Pike, MD as Consulting Physician (Gastroenterology) ?Lloyd Huger, MD as Consulting Physician (Oncology) ? ?CHIEF COMPLAINT: Iron deficiency anemia, MGUS. ? ?INTERVAL HISTORY: Patient returns to clinic today for further evaluation and consideration of additional IV iron.  He continues to have chronic weakness and fatigue, but otherwise feels well. He has no neurologic complaints.  He denies any fevers.  He has a good appetite and denies weight loss.  He has no chest pain, shortness of breath, cough, or hemoptysis.  He denies any nausea, vomiting, constipation, or diarrhea.  He has no melena or hematochezia.  He has no urinary complaints.  Patient offers no further specific complaints today. ? ?REVIEW OF SYSTEMS:   ?Review of Systems  ?Constitutional:  Positive for malaise/fatigue. Negative for fever and weight loss.  ?Respiratory: Negative.  Negative for cough, hemoptysis and shortness of breath.   ?Cardiovascular: Negative.  Negative for chest pain and leg swelling.  ?Gastrointestinal: Negative.  Negative for abdominal pain, blood in stool and melena.  ?Genitourinary: Negative.  Negative for hematuria.  ?Musculoskeletal: Negative.  Negative for back pain.  ?Skin: Negative.  Negative for rash.  ?Neurological:  Positive for weakness. Negative for dizziness, focal weakness and headaches.  ?Psychiatric/Behavioral: Negative.  The patient is not nervous/anxious.   ? ?As per HPI. Otherwise, a complete review of systems is negative. ? ?PAST MEDICAL HISTORY: ?Past Medical History:  ?Diagnosis Date  ? Arthritis   ? CHF (congestive heart failure), NYHA class II, acute on chronic, systolic (HCC)   ? EF 40% 07/2018  ? Coronary artery disease   [facility]  ? Diabetes  mellitus without complication ([place])   ? Dysplastic nevus 08/26/2020  ? L flank, moderate atypia  ? Hyperlipidemia   ? Hypertension   ? Melanoma ([place]) 08/20/2015  ? Right neck. Superficial spreading, arising in nevus. Tumor thickness 1.41m, Anatomic level IV  ? [facility] [place] 05/22/[facility]  ? R lateral neck ant to scar, exc 05/22/20  ? Numbness in right leg   ? due to back  ? Skin cancer   ? removed l arm  ? Spinal stenosis   ? Stented coronary artery 2000  ? X 1 STENT  ? Swelling of both lower extremities   ? ? ?PAST SURGICAL HISTORY: ?Past Surgical History:  ?Procedure Laterality Date  ? CATARACT EXTRACTION W/PHACO Right 05/03/2019  ? Procedure: CATARACT EXTRACTION PHACO AND INTRAOCULAR LENS PLACEMENT (IOC) RIGHT DIABETIC 6.44 00:58.4 11.0%;  Surgeon: BLeandrew Koyanagi MD;  [facility]  Service: Ophthalmology;  Laterality: Right;  DIABETIC  ? CATARACT EXTRACTION W/PHACO Left 06/28/2019  ? Procedure: CATARACT EXTRACTION PHACO AND INTRAOCULAR LENS PLACEMENT (IOC) LEFT DIABETIC 7.99  01:13.4  10.9% ;  Surgeon: BLeandrew Koyanagi MD;  [facility]  Service: Ophthalmology;  Laterality: Left;  Diabetic - insulin and oral meds  ? CHOLECYSTECTOMY    ? JOINT REPLACEMENT    ? RT KNEE  ? LUMBAR LAMINECTOMY/DECOMPRESSION MICRODISCECTOMY  04/13/2012  ? Procedure: LUMBAR LAMINECTOMY/DECOMPRESSION MICRODISCECTOMY 1 LEVEL;  Surgeon: JJohnn Hai MD;  [facility];  Service: Orthopedics;  Laterality: Bilateral;  L4-L5  ? UVULECTOMY  1990's  ? ? ?FAMILY HISTORY: ?No family history on file. ? ?ADVANCED DIRECTIVES (Y/N):  N ? ?HEALTH MAINTENANCE: ?Social History  ? ?  Tobacco Use  ? Smoking status: Never  ? Smokeless tobacco: Never  ?Vaping Use  ? Vaping Use: Never used  ?Substance Use Topics  ? Alcohol use: Yes  ?  Comment: rare  ? Drug use: No  ? ? ? Colonoscopy: ? PAP: ? Bone density: ? Lipid panel: ? ?Allergies  ?Allergen Reactions  ? Metoprolol Other (See Comments)  ?  Cant  remember  ? Toprol Xl [Metoprolol Tartrate] Other (See Comments)  ?  Cant remember  ? Buprenorphine Hcl Itching and Rash  ? Morphine Itching and Other (See Comments)  ? Morphine And Related Itching and Rash  ? Triamcinolone Acetonide Rash  ? ? ?Current Outpatient Medications  ?Medication Sig Dispense Refill  ? amLODipine (NORVASC) 5 MG tablet Take 5 mg by mouth daily.    ? aspirin 325 MG tablet Take 1 tablet (325 mg total) by mouth daily.    ? Continuous Blood Gluc Receiver (DEXCOM G6 RECEIVER) DEVI Use to monitor blood sugar. [place] [phone]    ? Continuous Blood Gluc Sensor (DEXCOM G6 SENSOR) MISC Use to monitor blood sugar.  Replace every 10 days. [place] [phone].    ? Continuous Blood Gluc Transmit (DEXCOM G6 TRANSMITTER) MISC Use to monitor blood sugar.  Replace every 3 months. [place] [address]-01.    ? FEROSUL 325 (65 Fe) MG tablet Take 1 tablet (325 mg total) by mouth 2 (two) times daily. 180 tablet 1  ? furosemide (LASIX) 20 MG tablet Take by mouth.    ? insulin aspart (NOVOLOG) 100 UNIT/ML injection Inject into the skin 3 (three) times daily before meals.    ? insulin glargine (LANTUS) 100 UNIT/ML injection Inject 44 Units into the skin daily.    ? LANTUS SOLOSTAR 100 UNIT/ML Solostar Pen SMARTSIG:44 Unit(s) SUB-Q Every Night    ? valsartan (DIOVAN) 160 MG tablet Take by mouth.    ? atorvastatin (LIPITOR) 80 MG tablet Take 80 mg by mouth daily. (Patient not taking: Reported on 07/17/2021)    ? isosorbide mononitrate (IMDUR) 30 MG 24 hr tablet Take by mouth.    ? ?No current facility-administered medications for this visit.  ? ? ?OBJECTIVE: ?Vitals:  ? 07/17/21 1437  ?BP: (!) 152/61  ?Pulse: 84  ?Resp: 18  ?Temp: 97.8 ?F (36.6 ?C)  ?SpO2: 99%  ?   Body mass index is 40.29 kg/m?Marland Kitchen    ECOG FS:0 - Asymptomatic ? ?General: Well-developed, well-nourished, no acute distress. ?Eyes: Pink conjunctiva, anicteric sclera. ?HEENT: Normocephalic, moist mucous membranes. ?Lungs: No audible wheezing or coughing. ?Heart:  Regular rate and rhythm. ?Abdomen: Soft, nontender, no obvious distention. ?Musculoskeletal: No edema, cyanosis, or clubbing. ?Neuro: Alert, answering all questions appropriately. Cranial nerves grossly intact. ?Skin: No rashes or petechiae noted. ?Psych: Normal affect. ? ?LAB RESULTS: ? ?Lab Results  ?Component Value Date  ? NA 138 06/02/2021  ? K 3.8 06/02/2021  ? CL 103 06/02/2021  ? CO2 29 06/02/2021  ? GLUCOSE 175 (H) 06/02/2021  ? BUN 18 06/02/2021  ? CREATININE 1.01 06/02/2021  ? CALCIUM 8.7 (L) 06/02/2021  ? PROT 7.1 06/02/2021  ? ALBUMIN 3.5 06/02/2021  ? AST 18 06/02/2021  ? ALT 20 06/02/2021  ? ALKPHOS 94 06/02/2021  ? BILITOT 1.3 (H) 06/02/2021  ? GFRNONAA >60 06/02/2021  ? GFRAA >60 10/15/2019  ? ? ?Lab Results  ?Component Value Date  ? WBC 6.5 07/14/2021  ? NEUTROABS 5.2 07/14/2021  ? HGB 10.8 (L) 07/14/2021  ? HCT 32.3 (L) 07/14/2021  ? MCV 89.0 07/14/2021  ?  PLT 186 07/14/2021  ? ?Lab Results  ?Component Value Date  ? IRON 67 07/14/2021  ? TIBC 270 07/14/2021  ? IRONPCTSAT 25 07/14/2021  ? ?Lab Results  ?Component Value Date  ? FERRITIN 104 07/14/2021  ? ? ? ?STUDIES: ?No results found. ? ?ASSESSMENT: Iron deficiency anemia, MGUS. ? ?PLAN:  ? ?1.  Iron deficiency anemia: Hemoglobin has improved to 10.8 and patient's iron stores continue to be within normal limits.  All of his other laboratory work other than a mildly elevated M spike is either negative or within normal limits.  He does not require additional IV Venofer today.  He does not require additional IV Venofer today.  Patient last received treatment on February 27, 2021.  Return to clinic in 4 months with repeat laboratory work, further evaluation, and consideration of additional treatment if needed.   ?2.  MGUS: Patient has an M spike of 0.3.  This is likely clinically insignificant and not contributing to his symptoms.  Repeat laboratory work in 4 months with next laboratory draw. ? ?I spent a total of 20 minutes reviewing chart data,  face-to-face evaluation with the patient, counseling and coordination of care as detailed above. ? ? ?Patient expressed understanding and was in agreement with this plan. He also understands that He can call cl

## 2021-07-14 ENCOUNTER — Inpatient Hospital Stay: Payer: Medicare HMO | Attending: Oncology

## 2021-07-14 DIAGNOSIS — D472 Monoclonal gammopathy: Secondary | ICD-10-CM | POA: Insufficient documentation

## 2021-07-14 DIAGNOSIS — D509 Iron deficiency anemia, unspecified: Secondary | ICD-10-CM | POA: Diagnosis not present

## 2021-07-14 DIAGNOSIS — Z79899 Other long term (current) drug therapy: Secondary | ICD-10-CM | POA: Insufficient documentation

## 2021-07-14 LAB — CBC WITH DIFFERENTIAL/PLATELET
Abs Immature Granulocytes: 0.01 10*3/uL (ref 0.00–0.07)
Basophils Absolute: 0 10*3/uL (ref 0.0–0.1)
Basophils Relative: 1 %
Eosinophils Absolute: 0.1 10*3/uL (ref 0.0–0.5)
Eosinophils Relative: 1 %
HCT: 32.3 % — ABNORMAL LOW (ref 39.0–52.0)
Hemoglobin: 10.8 g/dL — ABNORMAL LOW (ref 13.0–17.0)
Immature Granulocytes: 0 %
Lymphocytes Relative: 14 %
Lymphs Abs: 0.9 10*3/uL (ref 0.7–4.0)
MCH: 29.8 pg (ref 26.0–34.0)
MCHC: 33.4 g/dL (ref 30.0–36.0)
MCV: 89 fL (ref 80.0–100.0)
Monocytes Absolute: 0.3 10*3/uL (ref 0.1–1.0)
Monocytes Relative: 5 %
Neutro Abs: 5.2 10*3/uL (ref 1.7–7.7)
Neutrophils Relative %: 79 %
Platelets: 186 10*3/uL (ref 150–400)
RBC: 3.63 MIL/uL — ABNORMAL LOW (ref 4.22–5.81)
RDW: 13.3 % (ref 11.5–15.5)
WBC: 6.5 10*3/uL (ref 4.0–10.5)
nRBC: 0 % (ref 0.0–0.2)

## 2021-07-14 LAB — IRON AND TIBC
Iron: 67 ug/dL (ref 45–182)
Saturation Ratios: 25 % (ref 17.9–39.5)
TIBC: 270 ug/dL (ref 250–450)
UIBC: 203 ug/dL

## 2021-07-14 LAB — FERRITIN: Ferritin: 104 ng/mL (ref 24–336)

## 2021-07-17 ENCOUNTER — Inpatient Hospital Stay: Payer: Medicare HMO | Admitting: Oncology

## 2021-07-17 ENCOUNTER — Inpatient Hospital Stay: Payer: Medicare HMO

## 2021-07-17 VITALS — BP 152/61 | HR 84 | Temp 97.8°F | Resp 18 | Ht 72.0 in | Wt 297.1 lb

## 2021-07-17 DIAGNOSIS — D509 Iron deficiency anemia, unspecified: Secondary | ICD-10-CM

## 2021-07-17 DIAGNOSIS — Z79899 Other long term (current) drug therapy: Secondary | ICD-10-CM | POA: Diagnosis not present

## 2021-07-17 DIAGNOSIS — D472 Monoclonal gammopathy: Secondary | ICD-10-CM

## 2021-07-18 ENCOUNTER — Encounter: Payer: Self-pay | Admitting: Oncology

## 2021-07-31 DIAGNOSIS — E113293 Type 2 diabetes mellitus with mild nonproliferative diabetic retinopathy without macular edema, bilateral: Secondary | ICD-10-CM | POA: Diagnosis not present

## 2021-07-31 DIAGNOSIS — Z794 Long term (current) use of insulin: Secondary | ICD-10-CM | POA: Diagnosis not present

## 2021-08-01 DIAGNOSIS — E782 Mixed hyperlipidemia: Secondary | ICD-10-CM | POA: Diagnosis not present

## 2021-08-01 DIAGNOSIS — I6523 Occlusion and stenosis of bilateral carotid arteries: Secondary | ICD-10-CM | POA: Diagnosis not present

## 2021-08-01 DIAGNOSIS — I25118 Atherosclerotic heart disease of native coronary artery with other forms of angina pectoris: Secondary | ICD-10-CM | POA: Diagnosis not present

## 2021-08-01 DIAGNOSIS — E669 Obesity, unspecified: Secondary | ICD-10-CM | POA: Diagnosis not present

## 2021-08-01 DIAGNOSIS — E1169 Type 2 diabetes mellitus with other specified complication: Secondary | ICD-10-CM | POA: Diagnosis not present

## 2021-08-01 DIAGNOSIS — I700 Atherosclerosis of aorta: Secondary | ICD-10-CM | POA: Diagnosis not present

## 2021-08-01 DIAGNOSIS — I10 Essential (primary) hypertension: Secondary | ICD-10-CM | POA: Diagnosis not present

## 2021-08-01 DIAGNOSIS — I5022 Chronic systolic (congestive) heart failure: Secondary | ICD-10-CM | POA: Diagnosis not present

## 2021-08-04 DIAGNOSIS — E113293 Type 2 diabetes mellitus with mild nonproliferative diabetic retinopathy without macular edema, bilateral: Secondary | ICD-10-CM | POA: Diagnosis not present

## 2021-08-05 DIAGNOSIS — H33312 Horseshoe tear of retina without detachment, left eye: Secondary | ICD-10-CM | POA: Diagnosis not present

## 2021-08-18 DIAGNOSIS — H33312 Horseshoe tear of retina without detachment, left eye: Secondary | ICD-10-CM | POA: Diagnosis not present

## 2021-08-25 DIAGNOSIS — E1165 Type 2 diabetes mellitus with hyperglycemia: Secondary | ICD-10-CM | POA: Diagnosis not present

## 2021-09-01 ENCOUNTER — Ambulatory Visit: Payer: Medicare HMO | Admitting: Dermatology

## 2021-09-08 DIAGNOSIS — H33312 Horseshoe tear of retina without detachment, left eye: Secondary | ICD-10-CM | POA: Diagnosis not present

## 2021-09-24 ENCOUNTER — Telehealth: Payer: Self-pay | Admitting: Oncology

## 2021-09-24 ENCOUNTER — Other Ambulatory Visit: Payer: Self-pay | Admitting: *Deleted

## 2021-09-24 DIAGNOSIS — D509 Iron deficiency anemia, unspecified: Secondary | ICD-10-CM

## 2021-09-24 NOTE — Telephone Encounter (Signed)
Pt requested to move lab appt up due to feeling weak and tired. Appt has been moved, MD/venofer appt pending lab results

## 2021-09-25 ENCOUNTER — Inpatient Hospital Stay: Payer: Medicare HMO | Attending: Oncology

## 2021-09-25 DIAGNOSIS — D472 Monoclonal gammopathy: Secondary | ICD-10-CM | POA: Insufficient documentation

## 2021-09-25 DIAGNOSIS — D509 Iron deficiency anemia, unspecified: Secondary | ICD-10-CM | POA: Diagnosis not present

## 2021-09-25 LAB — CBC WITH DIFFERENTIAL/PLATELET
Abs Immature Granulocytes: 0.02 10*3/uL (ref 0.00–0.07)
Basophils Absolute: 0.1 10*3/uL (ref 0.0–0.1)
Basophils Relative: 1 %
Eosinophils Absolute: 0.1 10*3/uL (ref 0.0–0.5)
Eosinophils Relative: 2 %
HCT: 28.9 % — ABNORMAL LOW (ref 39.0–52.0)
Hemoglobin: 9.3 g/dL — ABNORMAL LOW (ref 13.0–17.0)
Immature Granulocytes: 0 %
Lymphocytes Relative: 11 %
Lymphs Abs: 0.7 10*3/uL (ref 0.7–4.0)
MCH: 29.3 pg (ref 26.0–34.0)
MCHC: 32.2 g/dL (ref 30.0–36.0)
MCV: 91.2 fL (ref 80.0–100.0)
Monocytes Absolute: 0.4 10*3/uL (ref 0.1–1.0)
Monocytes Relative: 7 %
Neutro Abs: 4.9 10*3/uL (ref 1.7–7.7)
Neutrophils Relative %: 79 %
Platelets: 191 10*3/uL (ref 150–400)
RBC: 3.17 MIL/uL — ABNORMAL LOW (ref 4.22–5.81)
RDW: 14.3 % (ref 11.5–15.5)
WBC: 6.1 10*3/uL (ref 4.0–10.5)
nRBC: 0 % (ref 0.0–0.2)

## 2021-09-25 LAB — IRON AND TIBC
Iron: 43 ug/dL — ABNORMAL LOW (ref 45–182)
Saturation Ratios: 19 % (ref 17.9–39.5)
TIBC: 227 ug/dL — ABNORMAL LOW (ref 250–450)
UIBC: 184 ug/dL

## 2021-09-25 LAB — SAMPLE TO BLOOD BANK

## 2021-09-25 LAB — FERRITIN: Ferritin: 180 ng/mL (ref 24–336)

## 2021-09-26 LAB — KAPPA/LAMBDA LIGHT CHAINS
Kappa free light chain: 55.2 mg/L — ABNORMAL HIGH (ref 3.3–19.4)
Kappa, lambda light chain ratio: 2.34 — ABNORMAL HIGH (ref 0.26–1.65)
Lambda free light chains: 23.6 mg/L (ref 5.7–26.3)

## 2021-09-26 LAB — IGG, IGA, IGM
IgA: 156 mg/dL (ref 61–437)
IgG (Immunoglobin G), Serum: 851 mg/dL (ref 603–1613)
IgM (Immunoglobulin M), Srm: 62 mg/dL (ref 15–143)

## 2021-09-29 DIAGNOSIS — I10 Essential (primary) hypertension: Secondary | ICD-10-CM | POA: Diagnosis not present

## 2021-09-29 DIAGNOSIS — I700 Atherosclerosis of aorta: Secondary | ICD-10-CM | POA: Diagnosis not present

## 2021-09-29 DIAGNOSIS — I5022 Chronic systolic (congestive) heart failure: Secondary | ICD-10-CM | POA: Diagnosis not present

## 2021-09-29 DIAGNOSIS — I6523 Occlusion and stenosis of bilateral carotid arteries: Secondary | ICD-10-CM | POA: Diagnosis not present

## 2021-09-29 DIAGNOSIS — I25118 Atherosclerotic heart disease of native coronary artery with other forms of angina pectoris: Secondary | ICD-10-CM | POA: Diagnosis not present

## 2021-09-29 DIAGNOSIS — E1159 Type 2 diabetes mellitus with other circulatory complications: Secondary | ICD-10-CM | POA: Diagnosis not present

## 2021-09-29 DIAGNOSIS — R600 Localized edema: Secondary | ICD-10-CM | POA: Diagnosis not present

## 2021-09-29 LAB — PROTEIN ELECTROPHORESIS, SERUM
A/G Ratio: 1 (ref 0.7–1.7)
Albumin ELP: 2.5 g/dL — ABNORMAL LOW (ref 2.9–4.4)
Alpha-1-Globulin: 0.3 g/dL (ref 0.0–0.4)
Alpha-2-Globulin: 0.6 g/dL (ref 0.4–1.0)
Beta Globulin: 0.7 g/dL (ref 0.7–1.3)
Gamma Globulin: 0.9 g/dL (ref 0.4–1.8)
Globulin, Total: 2.6 g/dL (ref 2.2–3.9)
M-Spike, %: 0.2 g/dL — ABNORMAL HIGH
Total Protein ELP: 5.1 g/dL — ABNORMAL LOW (ref 6.0–8.5)

## 2021-10-09 ENCOUNTER — Telehealth: Payer: Self-pay | Admitting: Oncology

## 2021-10-09 NOTE — Telephone Encounter (Signed)
Patient would like a call regarding his lab work done on 7/13. He has reviewed on Mychart and states that he feels like he needs an infusion.

## 2021-10-10 ENCOUNTER — Other Ambulatory Visit: Payer: Self-pay

## 2021-10-15 ENCOUNTER — Inpatient Hospital Stay: Payer: Medicare HMO | Attending: Oncology

## 2021-10-15 VITALS — BP 144/60 | HR 76 | Temp 97.7°F | Resp 18

## 2021-10-15 DIAGNOSIS — D509 Iron deficiency anemia, unspecified: Secondary | ICD-10-CM | POA: Diagnosis not present

## 2021-10-15 MED ORDER — SODIUM CHLORIDE 0.9 % IV SOLN
200.0000 mg | Freq: Once | INTRAVENOUS | Status: AC
  Administered 2021-10-15: 200 mg via INTRAVENOUS
  Filled 2021-10-15: qty 200

## 2021-10-15 MED ORDER — SODIUM CHLORIDE 0.9 % IV SOLN
Freq: Once | INTRAVENOUS | Status: AC
  Filled 2021-10-15: qty 250

## 2021-10-16 ENCOUNTER — Other Ambulatory Visit: Payer: Self-pay

## 2021-10-16 NOTE — Patient Outreach (Signed)
[facility] [name]) Care Management  10/16/2021  [name] Dec 26, 1950 [id number]   Telephone Screen    Outreach call to patient to introduce [name] and assess care needs as part of benefit of PCP office and insurance plan. Spoke with patietnt who reports thongs are going fairly well . Patient currently out getting lunch and brief call competed.    Main healthcare issue/concern today: Denies any at present. He has been getting iron infusion for anemia-states he got one yesterday which helps him feel better and have more energy.   Health Maintenance/Care Gaps:  DM Eye and Foot Exam: A1C level: 6.9 (07/31/21) pt reports gets done q59month   Conditions: Per chart review, patient has PMH that includes but not limited to iron deficiency anemia, DM, CHF and aortic atherosclerosis.     Medications Reviewed Today     Reviewed by FHayden Pedro RN (Registered Nurse) on 10/16/21 at 144 Med List Status: <None>   Medication Order Taking? Sig Documenting Provider Last Dose Status Informant  amLODipine (NORVASC) 5 MG tablet [id number] Take 5 mg by mouth daily. [provider] Taking Active   aspirin 325 MG tablet [id number] Take 1 tablet (325 mg total) by mouth daily. BLacie DraftM, PA-C Taking Active   atorvastatin (LIPITOR) 80 MG tablet [id number] Take 80 mg by mouth daily.  Patient not taking: Reported on 07/17/2021   [provider] Not Taking Active   Continuous Blood Gluc Receiver ([place] DEVI [id number] Use to monitor blood sugar. [phone], Historical, MD Taking Active   Continuous Blood Gluc Sensor (DEXCOM G6 SENSOR) MISC [id number] Use to monitor blood sugar.  Replace every 10 days. [phone] [provider] Taking Active   Continuous Blood Gluc Transmit (DEXCOM G6 TRANSMITTER) MISC [id number] Use to monitor blood sugar.  Replace every 3 months. [id number] [phone] [provider] Taking Active   FEROSUL 325 (65 Fe) MG tablet [id number] Take 1 tablet (325 mg total) by mouth 2 (two) times daily. FLloyd Huger MD Taking Active   furosemide (LASIX) 20 MG tablet [id number] Take by mouth. [provider] Taking Active   insulin aspart (NOVOLOG) 100 UNIT/ML injection [id number] Inject into the skin 3 (three) times daily before meals. [provider] Taking Active   insulin glargine (LANTUS) 100 UNIT/ML injection [id number] Inject 44 Units into the skin daily. [provider] Taking Active            Med Note ([facility] LINDSAY S   Wed May 03, 2019  6:59 AM) 35 last night units per preop instructions  isosorbide mononitrate (IMDUR) 30 MG 24 hr tablet [id number] Take by mouth. [provider] Taking Expired 05/09/20 2359   LANTUS SOLOSTAR 100 UNIT/ML Solostar Pen [id number] SMARTSIG:44 Unit(s) SUB-Q Every Night [provider] Taking Active   valsartan (DIOVAN) 160 MG tablet [id number] Take by mouth. [provider] Taking Active                10/16/2021    1:37 PM  Depression screen PHQ 2/9  Decreased Interest 0  Down, Depressed, Hopeless 0  PHQ - 2 Score 0       02/24/2021   11:28 AM 02/27/2021   11:53 AM 06/02/2021    1:03 PM 07/17/2021    2:37 PM 10/16/2021    1:43 PM  Fall Risk  Falls in the past year?  0  Was there an injury with Fall?     0  Fall Risk Category Calculator     0  Fall Risk Category     Low  Patient Fall Risk Level Low fall risk Low fall risk Low fall risk Low fall risk Low fall risk  Fall risk Follow up     Falls evaluation completed;Education provided    SDOH Screenings   Alcohol Screen: Not on file  Depression (PHQ2-9): Low Risk  (10/16/2021)   Depression (PHQ2-9)    PHQ-2 Score: 0  Financial Resource Strain: Not on file  Food Insecurity: No Food Insecurity (10/16/2021)   Hunger Vital Sign    [name]: Never  true    [name]: Never true  Housing: Not on file  Physical Activity: Not on file  Social Connections: Not on file  Stress: Not on file  Tobacco Use: Low Risk  (10/16/2021)   Patient History    Smoking Tobacco Use: Never    Smokeless Tobacco Use: Never    Passive Exposure: Not on file  Transportation Needs: No Transportation Needs (10/16/2021)   PRAPARE - Transportation    Lack of Transportation (Medical): No    Lack of Transportation (Non-Medical): No    Care Plan : Consulting civil engineer POC  Updates made by Hayden Pedro, RN since 10/16/2021 12:00 AM     Problem: Chronic Disease Mgmt of Chronic Condition-DM   Priority: High     Long-Range Goal: Development of POC for Mgmt of Chronic Condition-DM   Start Date: 10/16/2021  Expected End Date: 10/16/2021  Priority: High  Note:   Current Barriers:  Chronic Disease Management support and education needs related to DMII   RNCM Clinical Goal(s):  Patient will verbalize understanding of plan for management of DMII as evidenced by mgmt of chronic conditions take all medications exactly as prescribed and will call provider for medication related questions as evidenced by med compliance and adherence demonstrate Ongoing health management independence as evidenced by maintaining A1C level less than 7  through collaboration with RN Care manager, provider, and care team.   Interventions: 1:1 collaboration with primary care provider regarding development and update of comprehensive plan of care as evidenced by provider attestation and co-signature Inter-disciplinary care team collaboration (see longitudinal plan of care) Evaluation of current treatment plan related to  self management and patient's adherence to plan as established by provider   Diabetes Interventions:  (Status:  New goal.) Long Term Goal Assessed patient's understanding of A1c goal: <7% Provided education to patient about basic DM disease  process Reviewed medications with patient and discussed importance of medication adherence Counseled on importance of regular laboratory monitoring as prescribed Discussed plans with patient for ongoing care management follow up and provided patient with direct contact information for care management team Lab Results  Component Value Date   HGBA1C 8.1 (H) 04/13/2012   Patient Goals/Self-Care Activities: Take all medications as prescribed Attend all scheduled provider appointments Call provider office for new concerns or questions  schedule appointment with eye doctor Schedule foot exam appt  Follow Up Plan:  Telephone follow up appointment with care management team member scheduled for:  within the month of Nov The patient has been provided with contact information for the care management team and has been advised to call with any health related questions or concerns.        Consent: [facility]  reviewed and discussed with patient. Verbal consent for Booth given.   Plan: RN CM discussed with patient next outreach within the month of Nov. Patient agrees to care plan and follow up. RN CM will send barriers letter and route encounter to PCP. RN CM will send welcome letter to patient.  Enzo [place], RN,BSN,CCM [place] [facility] Telephonic Care Management Coordinator Direct Phone: [phone] Toll Free: [phone] Fax: [phone].

## 2021-10-30 DIAGNOSIS — I25118 Atherosclerotic heart disease of native coronary artery with other forms of angina pectoris: Secondary | ICD-10-CM | POA: Diagnosis not present

## 2021-10-30 DIAGNOSIS — E1159 Type 2 diabetes mellitus with other circulatory complications: Secondary | ICD-10-CM | POA: Diagnosis not present

## 2021-10-30 DIAGNOSIS — I5022 Chronic systolic (congestive) heart failure: Secondary | ICD-10-CM | POA: Diagnosis not present

## 2021-10-30 DIAGNOSIS — E782 Mixed hyperlipidemia: Secondary | ICD-10-CM | POA: Diagnosis not present

## 2021-10-30 DIAGNOSIS — I6523 Occlusion and stenosis of bilateral carotid arteries: Secondary | ICD-10-CM | POA: Diagnosis not present

## 2021-10-30 DIAGNOSIS — I700 Atherosclerosis of aorta: Secondary | ICD-10-CM | POA: Diagnosis not present

## 2021-10-30 DIAGNOSIS — R600 Localized edema: Secondary | ICD-10-CM | POA: Diagnosis not present

## 2021-10-30 DIAGNOSIS — I10 Essential (primary) hypertension: Secondary | ICD-10-CM | POA: Diagnosis not present

## 2021-11-03 DIAGNOSIS — E782 Mixed hyperlipidemia: Secondary | ICD-10-CM | POA: Diagnosis not present

## 2021-11-13 ENCOUNTER — Other Ambulatory Visit: Payer: Self-pay

## 2021-11-13 NOTE — Patient Outreach (Signed)
[facility]) Care Management  11/13/2021  [name] May 22, 1950 [id number]   Case Transfer  Pt will be followed for care coordination by [place] Management. Assigned RN CM will outreach to patient.   Enzo [place], RN,BSN,CCM [place] [facility] Telephonic Care Management Coordinator Direct Phone: [phone] Toll Free: [phone] Fax: [phone]

## 2021-11-18 ENCOUNTER — Other Ambulatory Visit: Payer: Medicare HMO

## 2021-11-24 MED FILL — Iron Sucrose Inj 20 MG/ML (Fe Equiv): INTRAVENOUS | Qty: 10 | Status: AC

## 2021-11-24 NOTE — Progress Notes (Unsigned)
[place]  Telephone:([phone]) [phone] Fax:(336) [phone]  ID: [name] OB: 03-Oct-1950  MR#: [id number]  CSN#:[id number]  Patient Care Team: Juluis Pitch, MD as PCP - General (Family Medicine) Efrain Sella, MD as Consulting Physician (Gastroenterology) Lloyd Huger, MD as Consulting Physician (Oncology)  CHIEF COMPLAINT: Iron deficiency anemia, MGUS.  INTERVAL HISTORY: Patient returns to clinic today for further evaluation and consideration of additional IV iron.  He continues to have chronic weakness and fatigue, but otherwise feels well. He has no neurologic complaints.  He denies any fevers.  He has a good appetite and denies weight loss.  He has no chest pain, shortness of breath, cough, or hemoptysis.  He denies any nausea, vomiting, constipation, or diarrhea.  He has no melena or hematochezia.  He has no urinary complaints.  Patient offers no further specific complaints today.  REVIEW OF SYSTEMS:   Review of Systems  Constitutional:  Positive for malaise/fatigue. Negative for fever and weight loss.  Respiratory: Negative.  Negative for cough, hemoptysis and shortness of breath.   Cardiovascular: Negative.  Negative for chest pain and leg swelling.  Gastrointestinal: Negative.  Negative for abdominal pain, blood in stool and melena.  Genitourinary: Negative.  Negative for hematuria.  Musculoskeletal: Negative.  Negative for back pain.  Skin: Negative.  Negative for rash.  Neurological:  Positive for weakness. Negative for dizziness, focal weakness and headaches.  Psychiatric/Behavioral: Negative.  The patient is not nervous/anxious.     As per HPI. Otherwise, a complete review of systems is negative.  PAST MEDICAL HISTORY: Past Medical History:  Diagnosis Date   Arthritis    CHF (congestive heart failure), NYHA class II, acute on chronic, systolic (HCC)    EF 50% 07/3974   Coronary artery disease    [facility]    Diabetes mellitus without complication ([place])    Dysplastic nevus 08/26/2020   L flank, moderate atypia   Hyperlipidemia    Hypertension    Melanoma ([place]) 08/20/2015   Right neck. Superficial spreading, arising in nevus. Tumor thickness 1.70m, Anatomic level IV   Melanoma in situ ([facility] 05/22/2020   R lateral neck ant to scar, exc 05/22/20   Numbness in right leg    due to back   Skin cancer    removed l arm   Spinal stenosis    Stented coronary artery 2000   X 1 STENT   Swelling of both lower extremities     PAST SURGICAL HISTORY: Past Surgical History:  Procedure Laterality Date   CATARACT EXTRACTION W/PHACO Right 05/03/2019   Procedure: CATARACT EXTRACTION PHACO AND INTRAOCULAR LENS PLACEMENT (IOC) RIGHT DIABETIC 6.44 00:58.4 11.0%;  Surgeon: BLeandrew Koyanagi MD;  [facility]  Service: Ophthalmology;  Laterality: Right;  DIABETIC   CATARACT EXTRACTION W/PHACO Left 06/28/2019   Procedure: CATARACT EXTRACTION PHACO AND INTRAOCULAR LENS PLACEMENT (IOC) LEFT DIABETIC 7.99  01:13.4  10.9% ;  Surgeon: BLeandrew Koyanagi MD;  [facility]  Service: Ophthalmology;  Laterality: Left;  Diabetic - insulin and oral meds   CHOLECYSTECTOMY     JOINT REPLACEMENT     RT KNEE   LUMBAR LAMINECTOMY/DECOMPRESSION MICRODISCECTOMY  04/13/2012   Procedure: LUMBAR LAMINECTOMY/DECOMPRESSION MICRODISCECTOMY 1 LEVEL;  Surgeon: JJohnn Hai MD;  Location: [facility];  Service: Orthopedics;  Laterality: Bilateral;  L4-L5   UVULECTOMY  1990's    FAMILY HISTORY: No family history on file.  ADVANCED DIRECTIVES (Y/N):  N  HEALTH MAINTENANCE: Social History  Tobacco Use   Smoking status: Never   Smokeless tobacco: Never  Vaping Use   Vaping Use: Never used  Substance Use Topics   Alcohol use: Yes    Comment: rare   Drug use: No     Colonoscopy:  PAP:  Bone density:  Lipid panel:  Allergies  Allergen Reactions   Metoprolol Other (See Comments)     Cant remember   Toprol Xl [Metoprolol Tartrate] Other (See Comments)    Cant remember   Buprenorphine Hcl Itching and Rash   Morphine Itching and Other (See Comments)   Morphine And Related Itching and Rash   Triamcinolone Acetonide Rash    Current Outpatient Medications  Medication Sig Dispense Refill   amLODipine (NORVASC) 5 MG tablet Take 5 mg by mouth daily.     aspirin 325 MG tablet Take 1 tablet (325 mg total) by mouth daily.     atorvastatin (LIPITOR) 80 MG tablet Take 80 mg by mouth daily. (Patient not taking: Reported on 07/17/2021)     Continuous Blood Gluc Receiver (DEXCOM G6 RECEIVER) DEVI Use to monitor blood sugar. [place] [phone]     Continuous Blood Gluc Sensor (DEXCOM G6 SENSOR) MISC Use to monitor blood sugar.  Replace every 10 days. [place] [phone].     Continuous Blood Gluc Transmit (DEXCOM G6 TRANSMITTER) MISC Use to monitor blood sugar.  Replace every 3 months. [place] [phone].     FEROSUL 325 (65 Fe) MG tablet Take 1 tablet (325 mg total) by mouth 2 (two) times daily. 180 tablet 1   furosemide (LASIX) 20 MG tablet Take by mouth.     insulin aspart (NOVOLOG) 100 UNIT/ML injection Inject into the skin 3 (three) times daily before meals.     insulin glargine (LANTUS) 100 UNIT/ML injection Inject 44 Units into the skin daily.     isosorbide mononitrate (IMDUR) 30 MG 24 hr tablet Take by mouth.     LANTUS SOLOSTAR 100 UNIT/ML Solostar Pen SMARTSIG:44 Unit(s) SUB-Q Every Night     valsartan (DIOVAN) 160 MG tablet Take by mouth.     No current facility-administered medications for this visit.    OBJECTIVE: There were no vitals filed for this visit.    There is no height or weight on file to calculate BMI.    ECOG FS:0 - Asymptomatic  General: Well-developed, well-nourished, no acute distress. Eyes: Pink conjunctiva, anicteric sclera. HEENT: Normocephalic, moist mucous membranes. Lungs: No audible wheezing or coughing. Heart: Regular rate and rhythm. Abdomen:  Soft, nontender, no obvious distention. Musculoskeletal: No edema, cyanosis, or clubbing. Neuro: Alert, answering all questions appropriately. Cranial nerves grossly intact. Skin: No rashes or petechiae noted. Psych: Normal affect.  LAB RESULTS:  Lab Results  Component Value Date   NA 138 06/02/2021   K 3.8 06/02/2021   CL 103 06/02/2021   CO2 29 06/02/2021   GLUCOSE 175 (H) 06/02/2021   BUN 18 06/02/2021   CREATININE 1.01 06/02/2021   CALCIUM 8.7 (L) 06/02/2021   PROT 7.1 06/02/2021   ALBUMIN 3.5 06/02/2021   AST 18 06/02/2021   ALT 20 06/02/2021   ALKPHOS 94 06/02/2021   BILITOT 1.3 (H) 06/02/2021   GFRNONAA >60 06/02/2021   GFRAA >60 10/15/2019    Lab Results  Component Value Date   WBC 6.1 09/25/2021   NEUTROABS 4.9 09/25/2021   HGB 9.3 (L) 09/25/2021   HCT 28.9 (L) 09/25/2021   MCV 91.2 09/25/2021   PLT 191 09/25/2021   Lab Results  Component Value  Date   IRON 43 (L) 09/25/2021   TIBC 227 (L) 09/25/2021   IRONPCTSAT 19 09/25/2021   Lab Results  Component Value Date   FERRITIN 180 09/25/2021     STUDIES: No results found.  ASSESSMENT: Iron deficiency anemia, MGUS.  PLAN:   1.  Iron deficiency anemia: Hemoglobin has improved to 10.8 and patient's iron stores continue to be within normal limits.  All of his other laboratory work other than a mildly elevated M spike is either negative or within normal limits.  He does not require additional IV Venofer today.  He does not require additional IV Venofer today.  Patient last received treatment on February 27, 2021.  Return to clinic in 4 months with repeat laboratory work, further evaluation, and consideration of additional treatment if needed.   2.  MGUS: Patient has an M spike of 0.3.  This is likely clinically insignificant and not contributing to his symptoms.  Repeat laboratory work in 4 months with next laboratory draw.  I spent a total of 20 minutes reviewing chart data, face-to-face evaluation with the  patient, counseling and coordination of care as detailed above.   Patient expressed understanding and was in agreement with this plan. He also understands that He can call clinic at any time with any questions, concerns, or complaints.    Lloyd Huger, MD   11/24/2021 11:19 PM

## 2021-11-25 ENCOUNTER — Encounter: Payer: Self-pay | Admitting: Oncology

## 2021-11-25 ENCOUNTER — Inpatient Hospital Stay: Payer: Medicare HMO | Attending: Oncology | Admitting: Oncology

## 2021-11-25 ENCOUNTER — Inpatient Hospital Stay: Payer: Medicare HMO

## 2021-11-25 VITALS — BP 152/67 | HR 93 | Temp 98.2°F | Ht 72.0 in | Wt 320.0 lb

## 2021-11-25 DIAGNOSIS — D472 Monoclonal gammopathy: Secondary | ICD-10-CM | POA: Insufficient documentation

## 2021-11-25 DIAGNOSIS — R531 Weakness: Secondary | ICD-10-CM | POA: Insufficient documentation

## 2021-11-25 DIAGNOSIS — R609 Edema, unspecified: Secondary | ICD-10-CM | POA: Insufficient documentation

## 2021-11-25 DIAGNOSIS — R5383 Other fatigue: Secondary | ICD-10-CM | POA: Insufficient documentation

## 2021-11-25 DIAGNOSIS — Z79899 Other long term (current) drug therapy: Secondary | ICD-10-CM | POA: Insufficient documentation

## 2021-11-25 DIAGNOSIS — D509 Iron deficiency anemia, unspecified: Secondary | ICD-10-CM | POA: Insufficient documentation

## 2021-11-25 DIAGNOSIS — R0602 Shortness of breath: Secondary | ICD-10-CM | POA: Insufficient documentation

## 2021-11-25 LAB — CBC
HCT: 26.5 % — ABNORMAL LOW (ref 39.0–52.0)
Hemoglobin: 8.8 g/dL — ABNORMAL LOW (ref 13.0–17.0)
MCH: 30.4 pg (ref 26.0–34.0)
MCHC: 33.2 g/dL (ref 30.0–36.0)
MCV: 91.7 fL (ref 80.0–100.0)
Platelets: 204 10*3/uL (ref 150–400)
RBC: 2.89 MIL/uL — ABNORMAL LOW (ref 4.22–5.81)
RDW: 16.3 % — ABNORMAL HIGH (ref 11.5–15.5)
WBC: 7.6 10*3/uL (ref 4.0–10.5)
nRBC: 0 % (ref 0.0–0.2)

## 2021-11-25 LAB — IRON AND TIBC
Iron: 57 ug/dL (ref 45–182)
Saturation Ratios: 23 % (ref 17.9–39.5)
TIBC: 251 ug/dL (ref 250–450)
UIBC: 194 ug/dL

## 2021-11-25 LAB — FERRITIN: Ferritin: 96 ng/mL (ref 24–336)

## 2021-11-25 LAB — BASIC METABOLIC PANEL
Anion gap: 2 — ABNORMAL LOW (ref 5–15)
BUN: 28 mg/dL — ABNORMAL HIGH (ref 8–23)
CO2: 27 mmol/L (ref 22–32)
Calcium: 8.5 mg/dL — ABNORMAL LOW (ref 8.9–10.3)
Chloride: 108 mmol/L (ref 98–111)
Creatinine, Ser: 1.15 mg/dL (ref 0.61–1.24)
GFR, Estimated: >60 mL/min (ref >60)
Glucose, Bld: 268 mg/dL — ABNORMAL HIGH (ref 70–99)
Potassium: 4.4 mmol/L (ref 3.5–5.1)
Sodium: 137 mmol/L (ref 135–145)

## 2021-11-25 NOTE — Progress Notes (Signed)
Patient complains of being weak and having sob

## 2021-11-26 LAB — IGG, IGA, IGM
IgA: 144 mg/dL (ref 61–437)
IgG (Immunoglobin G), Serum: 854 mg/dL (ref 603–1613)
IgM (Immunoglobulin M), Srm: 70 mg/dL (ref 15–143)

## 2021-11-26 LAB — KAPPA/LAMBDA LIGHT CHAINS
Kappa free light chain: 48.1 mg/L — ABNORMAL HIGH (ref 3.3–19.4)
Kappa, lambda light chain ratio: 2.29 — ABNORMAL HIGH (ref 0.26–1.65)
Lambda free light chains: 21 mg/L (ref 5.7–26.3)

## 2021-11-27 DIAGNOSIS — E113293 Type 2 diabetes mellitus with mild nonproliferative diabetic retinopathy without macular edema, bilateral: Secondary | ICD-10-CM | POA: Diagnosis not present

## 2021-11-27 DIAGNOSIS — R809 Proteinuria, unspecified: Secondary | ICD-10-CM | POA: Diagnosis not present

## 2021-11-27 DIAGNOSIS — E1159 Type 2 diabetes mellitus with other circulatory complications: Secondary | ICD-10-CM | POA: Diagnosis not present

## 2021-11-27 DIAGNOSIS — Z794 Long term (current) use of insulin: Secondary | ICD-10-CM | POA: Diagnosis not present

## 2021-11-27 DIAGNOSIS — E1129 Type 2 diabetes mellitus with other diabetic kidney complication: Secondary | ICD-10-CM | POA: Diagnosis not present

## 2021-11-27 LAB — PROTEIN ELECTROPHORESIS, SERUM
A/G Ratio: 1 (ref 0.7–1.7)
Albumin ELP: 2.4 g/dL — ABNORMAL LOW (ref 2.9–4.4)
Alpha-1-Globulin: 0.2 g/dL (ref 0.0–0.4)
Alpha-2-Globulin: 0.6 g/dL (ref 0.4–1.0)
Beta Globulin: 0.8 g/dL (ref 0.7–1.3)
Gamma Globulin: 0.8 g/dL (ref 0.4–1.8)
Globulin, Total: 2.5 g/dL (ref 2.2–3.9)
M-Spike, %: 0.3 g/dL — ABNORMAL HIGH
Total Protein ELP: 4.9 g/dL — ABNORMAL LOW (ref 6.0–8.5)

## 2021-12-23 DIAGNOSIS — I700 Atherosclerosis of aorta: Secondary | ICD-10-CM | POA: Diagnosis not present

## 2021-12-23 DIAGNOSIS — I10 Essential (primary) hypertension: Secondary | ICD-10-CM | POA: Diagnosis not present

## 2021-12-23 DIAGNOSIS — R0602 Shortness of breath: Secondary | ICD-10-CM | POA: Diagnosis not present

## 2021-12-23 DIAGNOSIS — I5022 Chronic systolic (congestive) heart failure: Secondary | ICD-10-CM | POA: Diagnosis not present

## 2021-12-23 DIAGNOSIS — I2510 Atherosclerotic heart disease of native coronary artery without angina pectoris: Secondary | ICD-10-CM | POA: Diagnosis not present

## 2021-12-25 DIAGNOSIS — E119 Type 2 diabetes mellitus without complications: Secondary | ICD-10-CM | POA: Diagnosis not present

## 2022-01-13 DIAGNOSIS — E1122 Type 2 diabetes mellitus with diabetic chronic kidney disease: Secondary | ICD-10-CM | POA: Insufficient documentation

## 2022-01-13 DIAGNOSIS — R3129 Other microscopic hematuria: Secondary | ICD-10-CM | POA: Insufficient documentation

## 2022-01-13 DIAGNOSIS — N182 Chronic kidney disease, stage 2 (mild): Secondary | ICD-10-CM | POA: Insufficient documentation

## 2022-01-13 DIAGNOSIS — N189 Chronic kidney disease, unspecified: Secondary | ICD-10-CM | POA: Diagnosis not present

## 2022-01-13 DIAGNOSIS — R600 Localized edema: Secondary | ICD-10-CM | POA: Insufficient documentation

## 2022-01-13 DIAGNOSIS — R809 Proteinuria, unspecified: Secondary | ICD-10-CM | POA: Diagnosis not present

## 2022-01-13 DIAGNOSIS — R8290 Unspecified abnormal findings in urine: Secondary | ICD-10-CM | POA: Diagnosis not present

## 2022-01-22 ENCOUNTER — Other Ambulatory Visit: Payer: Self-pay | Admitting: Nephrology

## 2022-01-22 DIAGNOSIS — E1122 Type 2 diabetes mellitus with diabetic chronic kidney disease: Secondary | ICD-10-CM

## 2022-01-22 DIAGNOSIS — R3129 Other microscopic hematuria: Secondary | ICD-10-CM

## 2022-01-22 DIAGNOSIS — R809 Proteinuria, unspecified: Secondary | ICD-10-CM

## 2022-01-22 DIAGNOSIS — R8290 Unspecified abnormal findings in urine: Secondary | ICD-10-CM

## 2022-01-22 DIAGNOSIS — N182 Chronic kidney disease, stage 2 (mild): Secondary | ICD-10-CM

## 2022-01-26 ENCOUNTER — Other Ambulatory Visit: Payer: Self-pay

## 2022-01-26 ENCOUNTER — Encounter: Payer: Medicare HMO | Attending: Internal Medicine | Admitting: *Deleted

## 2022-01-26 ENCOUNTER — Encounter: Payer: Self-pay | Admitting: *Deleted

## 2022-01-26 DIAGNOSIS — R0602 Shortness of breath: Secondary | ICD-10-CM | POA: Insufficient documentation

## 2022-01-26 DIAGNOSIS — I5022 Chronic systolic (congestive) heart failure: Secondary | ICD-10-CM | POA: Insufficient documentation

## 2022-01-26 NOTE — Progress Notes (Signed)
Virtual Visit completed. Patient informed on EP and RD appointment and 6 Minute walk test. Patient also informed of patient health questionnaires on My Chart. Patient Verbalizes understanding. Visit diagnosis can be found in [facility] 12/23/2021.

## 2022-01-27 DIAGNOSIS — R809 Proteinuria, unspecified: Secondary | ICD-10-CM | POA: Diagnosis not present

## 2022-01-27 DIAGNOSIS — E1122 Type 2 diabetes mellitus with diabetic chronic kidney disease: Secondary | ICD-10-CM | POA: Diagnosis not present

## 2022-01-27 DIAGNOSIS — N189 Chronic kidney disease, unspecified: Secondary | ICD-10-CM | POA: Diagnosis not present

## 2022-01-27 DIAGNOSIS — I10 Essential (primary) hypertension: Secondary | ICD-10-CM | POA: Diagnosis not present

## 2022-01-27 DIAGNOSIS — R600 Localized edema: Secondary | ICD-10-CM | POA: Diagnosis not present

## 2022-01-27 DIAGNOSIS — N079 Hereditary nephropathy, not elsewhere classified with unspecified morphologic lesions: Secondary | ICD-10-CM | POA: Diagnosis not present

## 2022-01-30 ENCOUNTER — Ambulatory Visit
Admission: RE | Admit: 2022-01-30 | Discharge: 2022-01-30 | Disposition: A | Discharge status: Home / Self Care | Payer: Medicare HMO | Source: Ambulatory Visit | Attending: Nephrology | Admitting: Nephrology

## 2022-01-30 DIAGNOSIS — N3289 Other specified disorders of bladder: Secondary | ICD-10-CM | POA: Diagnosis not present

## 2022-01-30 DIAGNOSIS — J90 Pleural effusion, not elsewhere classified: Secondary | ICD-10-CM | POA: Diagnosis not present

## 2022-01-30 DIAGNOSIS — N182 Chronic kidney disease, stage 2 (mild): Secondary | ICD-10-CM | POA: Insufficient documentation

## 2022-01-30 DIAGNOSIS — R8290 Unspecified abnormal findings in urine: Secondary | ICD-10-CM | POA: Diagnosis not present

## 2022-01-30 DIAGNOSIS — R3129 Other microscopic hematuria: Secondary | ICD-10-CM | POA: Insufficient documentation

## 2022-01-30 DIAGNOSIS — E1122 Type 2 diabetes mellitus with diabetic chronic kidney disease: Secondary | ICD-10-CM | POA: Diagnosis not present

## 2022-01-30 DIAGNOSIS — R809 Proteinuria, unspecified: Secondary | ICD-10-CM | POA: Insufficient documentation

## 2022-02-02 ENCOUNTER — Encounter: Payer: Medicare HMO | Admitting: *Deleted

## 2022-02-02 VITALS — Ht 70.75 in | Wt 307.7 lb

## 2022-02-02 DIAGNOSIS — R0602 Shortness of breath: Secondary | ICD-10-CM | POA: Diagnosis not present

## 2022-02-02 DIAGNOSIS — I5022 Chronic systolic (congestive) heart failure: Secondary | ICD-10-CM

## 2022-02-02 NOTE — Progress Notes (Signed)
Pulmonary Individual Treatment Plan  Patient Details  Name: [name] MRN: [id number] Date of Birth: [birth year] Referring Provider:   Flowsheet Row Pulmonary Rehab from 02/02/2022 in [facility] Cardiac and Pulmonary Rehab  Referring Provider [name]       Initial Encounter Date:  Flowsheet Row Pulmonary Rehab from 02/02/2022 in [facility] Cardiac and Pulmonary Rehab  Date 02/02/22       Visit Diagnosis: Heart failure, chronic systolic ([place])  Patient's Home Medications on Admission:  Current Outpatient Medications:    amLODipine (NORVASC) 5 MG tablet, Take 5 mg by mouth daily., Disp: , Rfl:    aspirin 325 MG tablet, Take 1 tablet (325 mg total) by mouth daily. (Patient not taking: Reported on 01/26/2022), Disp: , Rfl:    aspirin EC 81 MG tablet, Take by mouth., Disp: , Rfl:    atorvastatin (LIPITOR) 80 MG tablet, Take 80 mg by mouth daily., Disp: , Rfl:    carvedilol (COREG) 6.25 MG tablet, Take 6.25 mg by mouth 2 (two) times daily. (Patient not taking: Reported on 11/25/2021), Disp: , Rfl:    Continuous Blood Gluc Receiver (Hardy) DEVI, Use to monitor blood sugar. [place] [phone], Disp: , Rfl:    Continuous Blood Gluc Sensor (DEXCOM G6 SENSOR) MISC, Use to monitor blood sugar.  Replace every 10 days. [place] [id number]., Disp: , Rfl:    Continuous Blood Gluc Transmit (DEXCOM G6 TRANSMITTER) MISC, Use to monitor blood sugar.  Replace every 3 months. [place] [id number]., Disp: , Rfl:    FEROSUL 325 (65 Fe) MG tablet, Take 1 tablet (325 mg total) by mouth 2 (two) times daily. (Patient not taking: Reported on 01/26/2022), Disp: 180 tablet, Rfl: 1   ferrous sulfate (FEROSUL) 325 (65 FE) MG tablet, Take by mouth., Disp: , Rfl:    furosemide (LASIX) 20 MG tablet, Take by mouth. (Patient not taking: Reported on 01/26/2022), Disp: , Rfl:    furosemide (LASIX) 40 MG tablet, , Disp: , Rfl:    furosemide (LASIX) 80 MG tablet, Take by mouth. (Patient not taking: Reported on 01/26/2022),  Disp: , Rfl:    glipiZIDE (GLUCOTROL XL) 5 MG 24 hr tablet, , Disp: , Rfl:    insulin aspart (NOVOLOG) 100 UNIT/ML injection, Inject into the skin 3 (three) times daily before meals., Disp: , Rfl:    insulin glargine (LANTUS) 100 UNIT/ML injection, Inject 44 Units into the skin daily., Disp: , Rfl:    Insulin Pen Needle (COMFORT EZ PEN NEEDLES) 32G X 8 MM MISC, See admin instructions., Disp: , Rfl:    isosorbide mononitrate (IMDUR) 30 MG 24 hr tablet, Take by mouth., Disp: , Rfl:    JARDIANCE 10 MG TABS tablet, , Disp: , Rfl:    LANTUS SOLOSTAR 100 UNIT/ML Solostar Pen, SMARTSIG:44 Unit(s) SUB-Q Every Night, Disp: , Rfl:    losartan (COZAAR) 100 MG tablet, , Disp: , Rfl:    metFORMIN (GLUCOPHAGE) 1000 MG tablet, , Disp: , Rfl:    metFORMIN (GLUCOPHAGE-XR) 500 MG 24 hr tablet, Take by mouth. (Patient not taking: Reported on 01/26/2022), Disp: , Rfl:    NOVOLOG FLEXPEN 100 UNIT/ML FlexPen, INJ UP TO 50 UNI D [place] IN MULTIPLE INJECTIONS B MEALS UTD, Disp: , Rfl:    predniSONE (DELTASONE) 10 MG tablet, take 1 tab three times a day for 2 days, 1 tab twice a day for 5 days, 1 tab daily till finished (Patient not taking: Reported on 01/26/2022), Disp: , Rfl:    spironolactone (ALDACTONE) 25 MG tablet, ,  Disp: , Rfl:    urea (CARMOL) 10 % cream, Apply topically., Disp: , Rfl:    valsartan (DIOVAN) 160 MG tablet, Take by mouth. (Patient not taking: Reported on 01/26/2022), Disp: , Rfl:    valsartan (DIOVAN) 320 MG tablet, , Disp: , Rfl:   Past Medical History: Past Medical History:  Diagnosis Date   Arthritis    CHF (congestive heart failure), NYHA class II, acute on chronic, systolic (HCC)    EF 37% 10/5883   Coronary artery disease    [facility]   Diabetes mellitus without complication ([place])    Dysplastic nevus 08/26/2020   L flank, moderate atypia   Hyperlipidemia    Hypertension    Melanoma ([place]) 08/20/2015   Right neck. Superficial spreading, arising in nevus. Tumor  thickness 1.4m, Anatomic level IV   Melanoma in situ ([place] 05/22/2020   R lateral neck ant to scar, exc 05/22/20   Numbness in right leg    due to back   Skin cancer    removed l arm   Spinal stenosis    Stented coronary artery 2000   X 1 STENT   Swelling of both lower extremities     Tobacco Use: Social History   Tobacco Use  Smoking Status Never  Smokeless Tobacco Never    Labs: Review Flowsheet       Latest Ref Rng & Units 09/01/2006 03/31/2011 04/13/2012  Labs for ITP Cardiac and Pulmonary Rehab  Hemoglobin A1c <5.7 % 9.6 (NOTE)   The ADA recommends the following therapeutic goals for glycemic   control related to Hgb A1C measurement:   Goal of Therapy:   < 7.0% Hgb A1C   Action Suggested:  > 8.0% Hgb A1C   Ref:  Diabetes Care, 22, Suppl. 1, 1999  11.2  8.1      Pulmonary Assessment Scores:  Pulmonary Assessment Scores     Row Name 02/02/22 1722         ADL UCSD   ADL Phase Entry     SOB Score total 58     Rest 0     Walk 2     Stairs 5     Bath 1     Dress 2     Shop 1       CAT Score   CAT Score 25       mMRC Score   mMRC Score 4              UCSD: Self-administered rating of dyspnea associated with activities of daily living (ADLs) 6-point scale (0 = "not at all" to 5 = "maximal or unable to do because of breathlessness")  Scoring Scores range from 0 to 120.  Minimally important difference is 5 units  CAT: CAT can identify the health impairment of COPD patients and is better correlated with disease progression.  CAT has a scoring range of zero to 40. The CAT score is classified into four groups of low (less than 10), medium (10 - 20), high (21-30) and very high (31-40) based on the impact level of disease on health status. A CAT score over 10 suggests significant symptoms.  A worsening CAT score could be explained by an exacerbation, poor medication adherence, poor inhaler technique, or progression of COPD or comorbid conditions.  CAT MCID is  2 points  mMRC: mMRC (Modified Medical Research Council) Dyspnea Scale is used to assess the degree of baseline functional disability in patients of respiratory disease due  to dyspnea. No minimal important difference is established. A decrease in score of 1 point or greater is considered a positive change.   Pulmonary Function Assessment:  Pulmonary Function Assessment - 01/26/22 1111       Breath   Shortness of Breath Yes;Limiting activity             Exercise Target Goals: Exercise Program Goal: Individual exercise prescription set using results from initial 6 min walk test and THRR while considering  patient's activity barriers and safety.   Exercise Prescription Goal: Initial exercise prescription builds to 30-45 minutes a day of aerobic activity, 2-3 days per week.  Home exercise guidelines will be given to patient during program as part of exercise prescription that the participant will acknowledge.  Education: Aerobic Exercise: - Group verbal and visual presentation on the components of exercise prescription. Introduces F.I.T.T principle from ACSM for exercise prescriptions.  Reviews F.I.T.T. principles of aerobic exercise including progression. Written material given at graduation.   Education: Resistance Exercise: - Group verbal and visual presentation on the components of exercise prescription. Introduces F.I.T.T principle from ACSM for exercise prescriptions  Reviews F.I.T.T. principles of resistance exercise including progression. Written material given at graduation.    Education: Exercise & Equipment Safety: - Individual verbal instruction and demonstration of equipment use and safety with use of the equipment. Flowsheet Row Pulmonary Rehab from 02/02/2022 in [facility] Cardiac and Pulmonary Rehab  Date 02/02/22  Educator [facility]  Instruction Review Code 1- Verbalizes Understanding       Education: Exercise Physiology & General Exercise Guidelines: - Group verbal and  written instruction with models to review the exercise physiology of the cardiovascular system and associated critical values. Provides general exercise guidelines with specific guidelines to those with heart or lung disease.    Education: Flexibility, Balance, Mind/Body Relaxation: - Group verbal and visual presentation with interactive activity on the components of exercise prescription. Introduces F.I.T.T principle from ACSM for exercise prescriptions. Reviews F.I.T.T. principles of flexibility and balance exercise training including progression. Also discusses the mind body connection.  Reviews various relaxation techniques to help reduce and manage stress (i.e. Deep breathing, progressive muscle relaxation, and visualization). Balance handout provided to take home. Written material given at graduation.   Activity Barriers & Risk Stratification:   6 Minute Walk:  6 Minute Walk     Row Name 02/02/22 1701         6 Minute Walk   Phase Initial     Distance 480 feet     Walk Time 5.94 minutes     # of Rest Breaks 2     MPH 0.91     METS 0.8     RPE 15     Perceived Dyspnea  3     VO2 Peak 3     Symptoms No     Resting HR 96 bpm     Resting BP 158/90     Resting Oxygen Saturation  94 %     Exercise Oxygen Saturation  during 6 min walk 90 %     Max Ex. HR 120 bpm     Max Ex. BP 160/80     2 Minute Post BP 140/70       Interval HR   1 Minute HR 98     2 Minute HR 110     3 Minute HR 112     4 Minute HR 118     5 Minute HR 119  6 Minute HR 120     2 Minute Post HR 100     Interval Heart Rate? Yes       Interval Oxygen   Interval Oxygen? Yes     Baseline Oxygen Saturation % 94 %     1 Minute Oxygen Saturation % 90 %     1 Minute Liters of Oxygen 0 L     2 Minute Oxygen Saturation % 90 %     2 Minute Liters of Oxygen 0 L     3 Minute Oxygen Saturation % 91 %     3 Minute Liters of Oxygen 0 L     4 Minute Oxygen Saturation % 92 %     4 Minute Liters of Oxygen 0 L      5 Minute Oxygen Saturation % 92 %     5 Minute Liters of Oxygen 0 L     6 Minute Oxygen Saturation % 93 %     6 Minute Liters of Oxygen 0 L     2 Minute Post Oxygen Saturation % 98 %     2 Minute Post Liters of Oxygen 0 L             Oxygen Initial Assessment:  Oxygen Initial Assessment - 02/02/22 1721       Home Oxygen   Home Oxygen Device None    Sleep Oxygen Prescription None    Home Exercise Oxygen Prescription None    Home Resting Oxygen Prescription None      Initial 6 min Walk   Oxygen Used None      Program Oxygen Prescription   Program Oxygen Prescription None      Intervention   Short Term Goals To learn and understand importance of maintaining oxygen saturations>88%;To learn and understand importance of monitoring SPO2 with pulse oximeter and demonstrate accurate use of the pulse oximeter.;To learn and demonstrate proper pursed lip breathing techniques or other breathing techniques. ;To learn and exhibit compliance with exercise, home and travel O2 prescription    Long  Term Goals Verbalizes importance of monitoring SPO2 with pulse oximeter and return demonstration;Exhibits proper breathing techniques, such as pursed lip breathing or other method taught during program session;Maintenance of O2 saturations>88%;Exhibits compliance with exercise, home  and travel O2 prescription             Oxygen Re-Evaluation:   Oxygen Discharge (Final Oxygen Re-Evaluation):   Initial Exercise Prescription:  Initial Exercise Prescription - 02/02/22 1700       Date of Initial Exercise RX and Referring Provider   Date 02/02/22    Referring Provider [name]      Oxygen   Maintain Oxygen Saturation 88% or higher      Treadmill   MPH 0.8    Grade 0    Minutes 15    METs 1.6      NuStep   Level 1    SPM 80    Minutes 15    METs 1      REL-XR   Level 1    Speed 50    Minutes 15    METs 1      T5 Nustep   Level 1    SPM 80    Minutes 15    METs 1       Biostep-RELP   Level 1    SPM 50    Minutes 15    METs 1      Prescription Details  Frequency (times per week) 3    Duration Progress to 30 minutes of continuous aerobic without signs/symptoms of physical distress      Intensity   THRR 40-80% of Max Heartrate 116-136    Ratings of Perceived Exertion 11-13    Perceived Dyspnea 0-4      Progression   Progression Continue to progress workloads to maintain intensity without signs/symptoms of physical distress.      Resistance Training   Training Prescription Yes    Weight 2    Reps 10-15             Perform Capillary Blood Glucose checks as needed.  Exercise Prescription Changes:   Exercise Prescription Changes     Row Name 02/02/22 1700             Response to Exercise   Blood Pressure (Admit) 158/90       Blood Pressure (Exercise) 160/80       Blood Pressure (Exit) 140/70       Heart Rate (Admit) 96 bpm       Heart Rate (Exercise) 120 bpm       Heart Rate (Exit) 100 bpm       Oxygen Saturation (Admit) 94 %       Oxygen Saturation (Exercise) 90 %       Oxygen Saturation (Exit) 98 %       Rating of Perceived Exertion (Exercise) 15       Perceived Dyspnea (Exercise) 3       Symptoms none       Comments 6 MWT results                Exercise Comments:   Exercise Goals and Review:   Exercise Goals     Row Name 02/02/22 1713             Exercise Goals   Increase Physical Activity Yes       Intervention Provide advice, education, support and counseling about physical activity/exercise needs.;Develop an individualized exercise prescription for aerobic and resistive training based on initial evaluation findings, risk stratification, comorbidities and participant's personal goals.       Expected Outcomes Short Term: Attend rehab on a regular basis to increase amount of physical activity.;Long Term: Add in home exercise to make exercise part of routine and to increase amount of physical  activity.;Long Term: Exercising regularly at least 3-5 days a week.       Increase Strength and Stamina Yes       Intervention Provide advice, education, support and counseling about physical activity/exercise needs.;Develop an individualized exercise prescription for aerobic and resistive training based on initial evaluation findings, risk stratification, comorbidities and participant's personal goals.       Expected Outcomes Short Term: Increase workloads from initial exercise prescription for resistance, speed, and METs.;Short Term: Perform resistance training exercises routinely during rehab and add in resistance training at home;Long Term: Improve cardiorespiratory fitness, muscular endurance and strength as measured by increased METs and functional capacity (6MWT)       Able to understand and use rate of perceived exertion (RPE) scale Yes       Intervention Provide education and explanation on how to use RPE scale       Expected Outcomes Short Term: Able to use RPE daily in rehab to express subjective intensity level;Long Term:  Able to use RPE to guide intensity level when exercising independently       Able to understand and  use Dyspnea scale Yes       Intervention Provide education and explanation on how to use Dyspnea scale       Expected Outcomes Short Term: Able to use Dyspnea scale daily in rehab to express subjective sense of shortness of breath during exertion;Long Term: Able to use Dyspnea scale to guide intensity level when exercising independently       Knowledge and understanding of Target Heart Rate Range (THRR) Yes       Intervention Provide education and explanation of THRR including how the numbers were predicted and where they are located for reference       Expected Outcomes Short Term: Able to state/look up THRR;Long Term: Able to use THRR to govern intensity when exercising independently;Short Term: Able to use daily as guideline for intensity in rehab       Able to check  pulse independently Yes       Intervention Provide education and demonstration on how to check pulse in carotid and radial arteries.;Review the importance of being able to check your own pulse for safety during independent exercise       Expected Outcomes Short Term: Able to explain why pulse checking is important during independent exercise;Long Term: Able to check pulse independently and accurately       Understanding of Exercise Prescription Yes       Intervention Provide education, explanation, and written materials on patient's individual exercise prescription       Expected Outcomes Short Term: Able to explain program exercise prescription;Long Term: Able to explain home exercise prescription to exercise independently                Exercise Goals Re-Evaluation :   Discharge Exercise Prescription (Final Exercise Prescription Changes):  Exercise Prescription Changes - 02/02/22 1700       Response to Exercise   Blood Pressure (Admit) 158/90    Blood Pressure (Exercise) 160/80    Blood Pressure (Exit) 140/70    Heart Rate (Admit) 96 bpm    Heart Rate (Exercise) 120 bpm    Heart Rate (Exit) 100 bpm    Oxygen Saturation (Admit) 94 %    Oxygen Saturation (Exercise) 90 %    Oxygen Saturation (Exit) 98 %    Rating of Perceived Exertion (Exercise) 15    Perceived Dyspnea (Exercise) 3    Symptoms none    Comments 6 MWT results             Nutrition:  Target Goals: Understanding of nutrition guidelines, daily intake of sodium '1500mg'$ , cholesterol '200mg'$ , calories 30% from fat and 7% or less from saturated fats, daily to have 5 or more servings of fruits and vegetables.  Education: All About Nutrition: -Group instruction provided by verbal, written material, interactive activities, discussions, models, and posters to present general guidelines for heart healthy nutrition including fat, fiber, MyPlate, the role of sodium in heart healthy nutrition, utilization of the nutrition  label, and utilization of this knowledge for meal planning. Follow up email sent as well. Written material given at graduation. Flowsheet Row Pulmonary Rehab from 02/02/2022 in [facility] Cardiac and Pulmonary Rehab  Education need identified 02/02/22       Biometrics:  Pre Biometrics - 02/02/22 1714       Pre Biometrics   Height 5' 10.75" (1.797 m)    Weight 307 lb 11.2 oz (139.6 kg)    Waist Circumference 56 inches    Hip Circumference 54 inches    Waist to  Hip Ratio 1.04 %    BMI (Calculated) 43.22    Single Leg Stand 0 seconds              Nutrition Therapy Plan and Nutrition Goals:  Nutrition Therapy & Goals - 02/02/22 1607       Nutrition Therapy   Diet Heart healthy, low Na, T2DM    Drug/Food Interactions Statins/Certain Fruits    Protein (specify units) 80-85g    Fiber 30 grams    Whole Grain Foods 3 servings    Saturated Fats 16 max. grams    Fruits and Vegetables 8 servings/day    Sodium 2 grams      Personal Nutrition Goals   Nutrition Goal ST: practice building balanced meals with fiber, healthy fat, and protein. Continue to limit sodium. LT: limit Na <2g/day, follow MyPlate guidelines, manage A1C <7%    Comments 71 y.o. M admitted to pulmonary rehab for heart failure. PMHx includes T2DM, HTN, CAD, HLD, CKD stg 2. Relevant medications includes lipitor, ferrous sulfate, vitamin C, B-12, furosemide, novolog, lantus. He has a continuous meter and uses his short acting insulin using his current BG. [name] reports living with his son and his granddaughter. he is now drinking mostly water and limiting sodium. B: coffee with doughnut or muffin or poptart and some water L: leftovers, sandwich (pimento cheese or peanut butter - used to have deli meat) D: fruit - apples or sandwich (pimento cheese or peanut butter - used to have deli meat), non-starchy vegetables ("anything and everything" - he enjoys going to the farmers market), potatoes, steak, chicken, fish (cod). He cooks  meat with olive oil on pan - hes getting used ot an air fryer. S: does not snack late at night anymore. Sometimes he rpeorts just hvaing a baked potato to eat. Discussed MyPlate and balancing meals as well as heart healthy eating and T2DM MNT.      Intervention Plan   Intervention Prescribe, educate and counsel regarding individualized specific dietary modifications aiming towards targeted core components such as weight, hypertension, lipid management, diabetes, heart failure and other comorbidities.;Nutrition handout(s) given to patient.    Expected Outcomes Short Term Goal: Understand basic principles of dietary content, such as calories, fat, sodium, cholesterol and nutrients.;Short Term Goal: A plan has been developed with personal nutrition goals set during dietitian appointment.;Long Term Goal: Adherence to prescribed nutrition plan.             Nutrition Assessments:  MEDIFICTS Score Key: ?70 Need to make dietary changes  40-70 Heart Healthy Diet ? 40 Therapeutic Level Cholesterol Diet   Picture Your Plate Scores: <95 Unhealthy dietary pattern with much room for improvement. 41-50 Dietary pattern unlikely to meet recommendations for good health and room for improvement. 51-60 More healthful dietary pattern, with some room for improvement.  >60 Healthy dietary pattern, although there may be some specific behaviors that could be improved.   Nutrition Goals Re-Evaluation:   Nutrition Goals Discharge (Final Nutrition Goals Re-Evaluation):   Psychosocial: Target Goals: Acknowledge presence or absence of significant depression and/or stress, maximize coping skills, provide positive support system. Participant is able to verbalize types and ability to use techniques and skills needed for reducing stress and depression.   Education: Stress, Anxiety, and Depression - Group verbal and visual presentation to define topics covered.  Reviews how body is impacted by stress, anxiety, and  depression.  Also discusses healthy ways to reduce stress and to treat/manage anxiety and depression.  Written material given  at graduation. Flowsheet Row Pulmonary Rehab from 02/02/2022 in [facility] Cardiac and Pulmonary Rehab  Education need identified 02/02/22       Education: Sleep Hygiene -Provides group verbal and written instruction about how sleep can affect your health.  Define sleep hygiene, discuss sleep cycles and impact of sleep habits. Review good sleep hygiene tips.    Initial Review & Psychosocial Screening:  Initial Psych Review & Screening - 01/26/22 1113       Initial Review   Current issues with None Identified      Family Dynamics   Good Support System? Yes    Comments He can look to his son and grandaughter for support. He is concerned about his health due to his fluid build up and shortness of breath.      Barriers   Psychosocial barriers to participate in program The patient should benefit from training in stress management and relaxation.      Screening Interventions   Interventions Encouraged to exercise;To provide support and resources with identified psychosocial needs;Provide feedback about the scores to participant    Expected Outcomes Long Term Goal: Stressors or current issues are controlled or eliminated.;Short Term goal: Utilizing psychosocial counselor, staff and physician to assist with identification of specific Stressors or current issues interfering with healing process. Setting desired goal for each stressor or current issue identified.;Short Term goal: Identification and review with participant of any Quality of Life or Depression concerns found by scoring the questionnaire.;Long Term goal: The participant improves quality of Life and PHQ9 Scores as seen by post scores and/or verbalization of changes             Quality of Life Scores:  Quality of Life - 02/02/22 1716       Quality of Life   Select Quality of Life      Quality of Life  Scores   Health/Function Pre 13.38 %    Socioeconomic Pre 25.5 %    Psych/Spiritual Pre 29.14 %    Family Pre 30 %    GLOBAL Pre 21.43 %            Scores of 19 and below usually indicate a poorer quality of life in these areas.  A difference of  2-3 points is a clinically meaningful difference.  A difference of 2-3 points in the total score of the Quality of Life Index has been associated with significant improvement in overall quality of life, self-image, physical symptoms, and general health in studies assessing change in quality of life.  PHQ-9: Review Flowsheet       02/02/2022 10/16/2021  Depression screen PHQ 2/9  Decreased Interest 0 0  Down, Depressed, Hopeless 0 0  PHQ - 2 Score 0 0  Altered sleeping 0 -  Tired, decreased energy 3 -  Change in appetite 2 -  Feeling bad or failure about yourself  0 -  Trouble concentrating 0 -  Moving slowly or fidgety/restless 1 -  Suicidal thoughts 0 -  PHQ-9 Score 6 -  Difficult doing work/chores Somewhat difficult -   Interpretation of Total Score  Total Score Depression Severity:  1-4 = Minimal depression, 5-9 = Mild depression, 10-14 = Moderate depression, 15-19 = Moderately severe depression, 20-27 = Severe depression   Psychosocial Evaluation and Intervention:  Psychosocial Evaluation - 01/26/22 1115       Psychosocial Evaluation & Interventions   Interventions Stress management education;Relaxation education;Encouraged to exercise with the program and follow exercise prescription    Comments  He can look to his son and grandaughter for support. He is concerned about his health due to his fluid build up and shortness of breath.    Expected Outcomes Short: Start [facility] to help with mood. Long: Maintain a healthy mental state.    Continue Psychosocial Services  Follow up required by staff             Psychosocial Re-Evaluation:   Psychosocial Discharge (Final Psychosocial  Re-Evaluation):   Education: Education Goals: Education classes will be provided on a weekly basis, covering required topics. Participant will state understanding/return demonstration of topics presented.  Learning Barriers/Preferences:  Learning Barriers/Preferences - 01/26/22 1112       Learning Barriers/Preferences   Learning Barriers None    Learning Preferences None             General Pulmonary Education Topics:  Infection Prevention: - Provides verbal and written material to individual with discussion of infection control including proper hand washing and proper equipment cleaning during exercise session. Flowsheet Row Pulmonary Rehab from 02/02/2022 in [facility] Cardiac and Pulmonary Rehab  Date 02/02/22  Educator [facility]  Instruction Review Code 1- Verbalizes Understanding       Falls Prevention: - Provides verbal and written material to individual with discussion of falls prevention and safety. Flowsheet Row Pulmonary Rehab from 02/02/2022 in [facility] Cardiac and Pulmonary Rehab  Date 02/02/22  Educator [facility]  Instruction Review Code 1- Verbalizes Understanding       Chronic Lung Disease Review: - Group verbal instruction with posters, models, PowerPoint presentations and videos,  to review new updates, new respiratory medications, new advancements in procedures and treatments. Providing information on websites and "800" numbers for continued self-education. Includes information about supplement oxygen, available portable oxygen systems, continuous and intermittent flow rates, oxygen safety, concentrators, and Medicare reimbursement for oxygen. Explanation of Pulmonary Drugs, including class, frequency, complications, importance of spacers, rinsing mouth after steroid MDI's, and proper cleaning methods for nebulizers. Review of basic lung anatomy and physiology related to function, structure, and complications of lung disease. Review of risk factors. Discussion about methods for  diagnosing sleep apnea and types of masks and machines for OSA. Includes a review of the use of types of environmental controls: home humidity, furnaces, filters, dust mite/pet prevention, HEPA vacuums. Discussion about weather changes, air quality and the benefits of nasal washing. Instruction on Warning signs, infection symptoms, calling MD promptly, preventive modes, and value of vaccinations. Review of effective airway clearance, coughing and/or vibration techniques. Emphasizing that all should Create an Action Plan. Written material given at graduation.   AED/CPR: - Group verbal and written instruction with the use of models to demonstrate the basic use of the AED with the basic ABC's of resuscitation.    Anatomy and Cardiac Procedures: - Group verbal and visual presentation and models provide information about basic cardiac anatomy and function. Reviews the testing methods done to diagnose heart disease and the outcomes of the test results. Describes the treatment choices: Medical Management, Angioplasty, or Coronary Bypass Surgery for treating various heart conditions including Myocardial Infarction, Angina, Valve Disease, and Cardiac Arrhythmias.  Written material given at graduation. Flowsheet Row Pulmonary Rehab from 02/02/2022 in [facility] Cardiac and Pulmonary Rehab  Education need identified 02/02/22       Medication Safety: - Group verbal and visual instruction to review commonly prescribed medications for heart and lung disease. Reviews the medication, class of the drug, and side effects. Includes the steps to properly store meds and maintain the  prescription regimen.  Written material given at graduation.   Other: -Provides group and verbal instruction on various topics (see comments)   Knowledge Questionnaire Score:  Knowledge Questionnaire Score - 02/02/22 1720       Knowledge Questionnaire Score   Pre Score 21/26              Core Components/Risk Factors/Patient  Goals at Admission:  Personal Goals and Risk Factors at Admission - 02/02/22 1720       Core Components/Risk Factors/Patient Goals on Admission    Weight Management Yes;Weight Loss;Obesity    Intervention Weight Management: Develop a combined nutrition and exercise program designed to reach desired caloric intake, while maintaining appropriate intake of nutrient and fiber, sodium and fats, and appropriate energy expenditure required for the weight goal.;Weight Management: Provide education and appropriate resources to help participant work on and attain dietary goals.;Weight Management/Obesity: Establish reasonable short term and long term weight goals.;Obesity: Provide education and appropriate resources to help participant work on and attain dietary goals.    Admit Weight 307 lb 11.2 oz (139.6 kg)    Goal Weight: Short Term 295 lb (133.8 kg)    Goal Weight: Long Term 250 lb (113.4 kg)    Expected Outcomes Short Term: Continue to assess and modify interventions until short term weight is achieved;Long Term: Adherence to nutrition and physical activity/exercise program aimed toward attainment of established weight goal;Weight Maintenance: Understanding of the daily nutrition guidelines, which includes 25-35% calories from fat, 7% or less cal from saturated fats, less than '200mg'$  cholesterol, less than 1.5gm of sodium, & 5 or more servings of fruits and vegetables daily;Weight Loss: Understanding of general recommendations for a balanced deficit meal plan, which promotes 1-2 lb weight loss per week and includes a negative energy balance of [phone] kcal/d;Understanding recommendations for meals to include 15-35% energy as protein, 25-35% energy from fat, 35-60% energy from carbohydrates, less than '200mg'$  of dietary cholesterol, 20-35 gm of total fiber daily;Understanding of distribution of calorie intake throughout the day with the consumption of 4-5 meals/snacks    Improve shortness of breath with ADL's Yes     Intervention Provide education, individualized exercise plan and daily activity instruction to help decrease symptoms of SOB with activities of daily living.    Expected Outcomes Short Term: Improve cardiorespiratory fitness to achieve a reduction of symptoms when performing ADLs;Long Term: Be able to perform more ADLs without symptoms or delay the onset of symptoms    Diabetes Yes    Intervention Provide education about signs/symptoms and action to take for hypo/hyperglycemia.;Provide education about proper nutrition, including hydration, and aerobic/resistive exercise prescription along with prescribed medications to achieve blood glucose in normal ranges: Fasting glucose 65-99 mg/dL    Expected Outcomes Short Term: Participant verbalizes understanding of the signs/symptoms and immediate care of hyper/hypoglycemia, proper foot care and importance of medication, aerobic/resistive exercise and nutrition plan for blood glucose control.;Long Term: Attainment of HbA1C < 7%.    Heart Failure Yes    Intervention Provide a combined exercise and nutrition program that is supplemented with education, support and counseling about heart failure. Directed toward relieving symptoms such as shortness of breath, decreased exercise tolerance, and extremity edema.    Expected Outcomes Improve functional capacity of life;Short term: Attendance in program 2-3 days a week with increased exercise capacity. Reported lower sodium intake. Reported increased fruit and vegetable intake. Reports medication compliance.;Short term: Daily weights obtained and reported for increase. Utilizing diuretic protocols set by physician.;Long term: Adoption of  self-care skills and reduction of barriers for early signs and symptoms recognition and intervention leading to self-care maintenance.    Hypertension Yes    Intervention Provide education on lifestyle modifcations including regular physical activity/exercise, weight management,  moderate sodium restriction and increased consumption of fresh fruit, vegetables, and low fat dairy, alcohol moderation, and smoking cessation.;Monitor prescription use compliance.    Expected Outcomes Short Term: Continued assessment and intervention until BP is < 140/81m HG in hypertensive participants. < 130/831mHG in hypertensive participants with diabetes, heart failure or chronic kidney disease.;Long Term: Maintenance of blood pressure at goal levels.    Lipids Yes    Intervention Provide education and support for participant on nutrition & aerobic/resistive exercise along with prescribed medications to achieve LDL '70mg'$ , HDL >'40mg'$ .    Expected Outcomes Short Term: Participant states understanding of desired cholesterol values and is compliant with medications prescribed. Participant is following exercise prescription and nutrition guidelines.;Long Term: Cholesterol controlled with medications as prescribed, with individualized exercise RX and with personalized nutrition plan. Value goals: LDL < '70mg'$ , HDL > 40 mg.             Education:Diabetes - Individual verbal and written instruction to review signs/symptoms of diabetes, desired ranges of glucose level fasting, after meals and with exercise. Acknowledge that pre and post exercise glucose checks will be done for 3 sessions at entry of program. Flowsheet [facility]  Date 02/02/22  [facility]- [facility]       Know Your Numbers and Heart Failure: - Group verbal and visual instruction to discuss disease risk factors for cardiac and pulmonary disease and treatment options.  Reviews associated critical values for Overweight/Obesity, Hypertension, Cholesterol, and Diabetes.  Discusses basics of heart failure: signs/symptoms and treatments.  Introduces Heart Failure Zone chart for action plan for heart failure.  Written material given at  graduation. [facility]  Education need identified 02/02/22       Core Components/Risk Factors/Patient Goals Review:    Core Components/Risk Factors/Patient Goals at Discharge (Final Review):    ITP Comments:  ITP Comments     Row Name 01/26/22 1110 02/02/22 1701         ITP Comments Virtual Visit completed. Patient informed on EP and RD appointment and 6 Minute walk test. Patient also informed of patient health questionnaires on My Chart. Patient Verbalizes understanding. [facility] Completed 6MWT and gym orientation. Initial ITP created and sent for review to Dr. FaZetta BillsMedical Director.               Comments: initial ITP

## 2022-02-02 NOTE — Patient Instructions (Signed)
Patient Instructions  Patient Details  Name: [name] MRN: [id number] Date of Birth: 1950/04/04 Referring Provider:  Corey Skains, MD  Below are your personal goals for exercise, nutrition, and risk factors. Our goal is to help you stay on track towards obtaining and maintaining these goals. We will be discussing your progress on these goals with you throughout the program.  Initial Exercise Prescription:  Initial Exercise Prescription - 02/02/22 1700       Date of Initial Exercise RX and Referring Provider   Date 02/02/22    Referring Provider [name]      Oxygen   Maintain Oxygen Saturation 88% or higher      Treadmill   MPH 0.8    Grade 0    Minutes 15    METs 1.6      NuStep   Level 1    SPM 80    Minutes 15    METs 1      REL-XR   Level 1    Speed 50    Minutes 15    METs 1      T5 Nustep   Level 1    SPM 80    Minutes 15    METs 1      Biostep-RELP   Level 1    SPM 50    Minutes 15    METs 1      Prescription Details   Frequency (times per week) 3    Duration Progress to 30 minutes of continuous aerobic without signs/symptoms of physical distress      Intensity   THRR 40-80% of Max Heartrate 116-136    Ratings of Perceived Exertion 11-13    Perceived Dyspnea 0-4      Progression   Progression Continue to progress workloads to maintain intensity without signs/symptoms of physical distress.      Resistance Training   Training Prescription Yes    Weight 2    Reps 10-15             Exercise Goals: Frequency: Be able to perform aerobic exercise two to three times per week in program working toward 2-5 days per week of home exercise.  Intensity: Work with a perceived exertion of 11 (fairly light) - 15 (hard) while following your exercise prescription.  We will make changes to your prescription with you as you progress through the program.   Duration: Be able to do 30 to 45 minutes of continuous aerobic exercise in addition to a  5 minute warm-up and a 5 minute cool-down routine.   Nutrition Goals: Your personal nutrition goals will be established when you do your nutrition analysis with the dietician.  The following are general nutrition guidelines to follow: Cholesterol < '200mg'$ /day Sodium < '1500mg'$ /day Fiber: Men over 50 yrs - 30 grams per day  Personal Goals:  Personal Goals and Risk Factors at Admission - 02/02/22 1720       Core Components/Risk Factors/Patient Goals on Admission    Weight Management Yes;Weight Loss;Obesity    Intervention Weight Management: Develop a combined nutrition and exercise program designed to reach desired caloric intake, while maintaining appropriate intake of nutrient and fiber, sodium and fats, and appropriate energy expenditure required for the weight goal.;Weight Management: Provide education and appropriate resources to help participant work on and attain dietary goals.;Weight Management/Obesity: Establish reasonable short term and long term weight goals.;Obesity: Provide education and appropriate resources to help participant work on and attain dietary goals.  Admit Weight 307 lb 11.2 oz (139.6 kg)    Goal Weight: Short Term 295 lb (133.8 kg)    Goal Weight: Long Term 250 lb (113.4 kg)    Expected Outcomes Short Term: Continue to assess and modify interventions until short term weight is achieved;Long Term: Adherence to nutrition and physical activity/exercise program aimed toward attainment of established weight goal;Weight Maintenance: Understanding of the daily nutrition guidelines, which includes 25-35% calories from fat, 7% or less cal from saturated fats, less than '200mg'$  cholesterol, less than 1.5gm of sodium, & 5 or more servings of fruits and vegetables daily;Weight Loss: Understanding of general recommendations for a balanced deficit meal plan, which promotes 1-2 lb weight loss per week and includes a negative energy balance of [phone] kcal/d;Understanding recommendations  for meals to include 15-35% energy as protein, 25-35% energy from fat, 35-60% energy from carbohydrates, less than '200mg'$  of dietary cholesterol, 20-35 gm of total fiber daily;Understanding of distribution of calorie intake throughout the day with the consumption of 4-5 meals/snacks    Improve shortness of breath with ADL's Yes    Intervention Provide education, individualized exercise plan and daily activity instruction to help decrease symptoms of SOB with activities of daily living.    Expected Outcomes Short Term: Improve cardiorespiratory fitness to achieve a reduction of symptoms when performing ADLs;Long Term: Be able to perform more ADLs without symptoms or delay the onset of symptoms    Diabetes Yes    Intervention Provide education about signs/symptoms and action to take for hypo/hyperglycemia.;Provide education about proper nutrition, including hydration, and aerobic/resistive exercise prescription along with prescribed medications to achieve blood glucose in normal ranges: Fasting glucose 65-99 mg/dL    Expected Outcomes Short Term: Participant verbalizes understanding of the signs/symptoms and immediate care of hyper/hypoglycemia, proper foot care and importance of medication, aerobic/resistive exercise and nutrition plan for blood glucose control.;Long Term: Attainment of HbA1C < 7%.    Heart Failure Yes    Intervention Provide a combined exercise and nutrition program that is supplemented with education, support and counseling about heart failure. Directed toward relieving symptoms such as shortness of breath, decreased exercise tolerance, and extremity edema.    Expected Outcomes Improve functional capacity of life;Short term: Attendance in program 2-3 days a week with increased exercise capacity. Reported lower sodium intake. Reported increased fruit and vegetable intake. Reports medication compliance.;Short term: Daily weights obtained and reported for increase. Utilizing diuretic protocols  set by physician.;Long term: Adoption of self-care skills and reduction of barriers for early signs and symptoms recognition and intervention leading to self-care maintenance.    Hypertension Yes    Intervention Provide education on lifestyle modifcations including regular physical activity/exercise, weight management, moderate sodium restriction and increased consumption of fresh fruit, vegetables, and low fat dairy, alcohol moderation, and smoking cessation.;Monitor prescription use compliance.    Expected Outcomes Short Term: Continued assessment and intervention until BP is < 140/51m HG in hypertensive participants. < 130/843mHG in hypertensive participants with diabetes, heart failure or chronic kidney disease.;Long Term: Maintenance of blood pressure at goal levels.    Lipids Yes    Intervention Provide education and support for participant on nutrition & aerobic/resistive exercise along with prescribed medications to achieve LDL '70mg'$ , HDL >'40mg'$ .    Expected Outcomes Short Term: Participant states understanding of desired cholesterol values and is compliant with medications prescribed. Participant is following exercise prescription and nutrition guidelines.;Long Term: Cholesterol controlled with medications as prescribed, with individualized exercise RX and with personalized nutrition plan. Value  goals: LDL < '70mg'$ , HDL > 40 mg.             Tobacco Use Initial Evaluation: Social History   Tobacco Use  Smoking Status Never  Smokeless Tobacco Never    Exercise Goals and Review:  Exercise Goals     Row Name 02/02/22 1713             Exercise Goals   Increase Physical Activity Yes       Intervention Provide advice, education, support and counseling about physical activity/exercise needs.;Develop an individualized exercise prescription for aerobic and resistive training based on initial evaluation findings, risk stratification, comorbidities and participant's personal goals.        Expected Outcomes Short Term: Attend rehab on a regular basis to increase amount of physical activity.;Long Term: Add in home exercise to make exercise part of routine and to increase amount of physical activity.;Long Term: Exercising regularly at least 3-5 days a week.       Increase Strength and Stamina Yes       Intervention Provide advice, education, support and counseling about physical activity/exercise needs.;Develop an individualized exercise prescription for aerobic and resistive training based on initial evaluation findings, risk stratification, comorbidities and participant's personal goals.       Expected Outcomes Short Term: Increase workloads from initial exercise prescription for resistance, speed, and METs.;Short Term: Perform resistance training exercises routinely during rehab and add in resistance training at home;Long Term: Improve cardiorespiratory fitness, muscular endurance and strength as measured by increased METs and functional capacity (6MWT)       Able to understand and use rate of perceived exertion (RPE) scale Yes       Intervention Provide education and explanation on how to use RPE scale       Expected Outcomes Short Term: Able to use RPE daily in rehab to express subjective intensity level;Long Term:  Able to use RPE to guide intensity level when exercising independently       Able to understand and use Dyspnea scale Yes       Intervention Provide education and explanation on how to use Dyspnea scale       Expected Outcomes Short Term: Able to use Dyspnea scale daily in rehab to express subjective sense of shortness of breath during exertion;Long Term: Able to use Dyspnea scale to guide intensity level when exercising independently       Knowledge and understanding of Target Heart Rate Range (THRR) Yes       Intervention Provide education and explanation of THRR including how the numbers were predicted and where they are located for reference       Expected Outcomes  Short Term: Able to state/look up THRR;Long Term: Able to use THRR to govern intensity when exercising independently;Short Term: Able to use daily as guideline for intensity in rehab       Able to check pulse independently Yes       Intervention Provide education and demonstration on how to check pulse in carotid and radial arteries.;Review the importance of being able to check your own pulse for safety during independent exercise       Expected Outcomes Short Term: Able to explain why pulse checking is important during independent exercise;Long Term: Able to check pulse independently and accurately       Understanding of Exercise Prescription Yes       Intervention Provide education, explanation, and written materials on patient's individual exercise prescription  Expected Outcomes Short Term: Able to explain program exercise prescription;Long Term: Able to explain home exercise prescription to exercise independently                Copy of goals given to participant.

## 2022-02-09 ENCOUNTER — Other Ambulatory Visit: Payer: Self-pay | Admitting: Nephrology

## 2022-02-09 DIAGNOSIS — R809 Proteinuria, unspecified: Secondary | ICD-10-CM

## 2022-02-10 DIAGNOSIS — H35372 Puckering of macula, left eye: Secondary | ICD-10-CM | POA: Diagnosis not present

## 2022-02-10 DIAGNOSIS — Z0100 Encounter for examination of eyes and vision without abnormal findings: Secondary | ICD-10-CM | POA: Diagnosis not present

## 2022-02-11 ENCOUNTER — Encounter: Payer: Self-pay | Admitting: *Deleted

## 2022-02-11 DIAGNOSIS — I5022 Chronic systolic (congestive) heart failure: Secondary | ICD-10-CM

## 2022-02-11 NOTE — Progress Notes (Signed)
Pulmonary Individual Treatment Plan  Patient Details  Name: [name] MRN: [id number] Date of Birth: [birth year] Referring Provider:   Flowsheet Row Pulmonary Rehab from 02/02/2022 in [facility] Cardiac and Pulmonary Rehab  Referring Provider [name]       Initial Encounter Date:  Flowsheet Row Pulmonary Rehab from 02/02/2022 in [facility] Cardiac and Pulmonary Rehab  Date 02/02/22       Visit Diagnosis: Heart failure, chronic systolic ([place])  Patient's Home Medications on Admission:  Current Outpatient Medications:    amLODipine (NORVASC) 5 MG tablet, Take 5 mg by mouth daily., Disp: , Rfl:    aspirin 325 MG tablet, Take 1 tablet (325 mg total) by mouth daily. (Patient not taking: Reported on 01/26/2022), Disp: , Rfl:    aspirin EC 81 MG tablet, Take by mouth., Disp: , Rfl:    atorvastatin (LIPITOR) 80 MG tablet, Take 80 mg by mouth daily., Disp: , Rfl:    carvedilol (COREG) 6.25 MG tablet, Take 6.25 mg by mouth 2 (two) times daily. (Patient not taking: Reported on 11/25/2021), Disp: , Rfl:    Continuous Blood Gluc Receiver (Hardy) DEVI, Use to monitor blood sugar. [place] [phone], Disp: , Rfl:    Continuous Blood Gluc Sensor (DEXCOM G6 SENSOR) MISC, Use to monitor blood sugar.  Replace every 10 days. [place] [id number]., Disp: , Rfl:    Continuous Blood Gluc Transmit (DEXCOM G6 TRANSMITTER) MISC, Use to monitor blood sugar.  Replace every 3 months. [place] [id number]., Disp: , Rfl:    FEROSUL 325 (65 Fe) MG tablet, Take 1 tablet (325 mg total) by mouth 2 (two) times daily. (Patient not taking: Reported on 01/26/2022), Disp: 180 tablet, Rfl: 1   ferrous sulfate (FEROSUL) 325 (65 FE) MG tablet, Take by mouth., Disp: , Rfl:    furosemide (LASIX) 20 MG tablet, Take by mouth. (Patient not taking: Reported on 01/26/2022), Disp: , Rfl:    furosemide (LASIX) 40 MG tablet, , Disp: , Rfl:    furosemide (LASIX) 80 MG tablet, Take by mouth. (Patient not taking: Reported on 01/26/2022),  Disp: , Rfl:    glipiZIDE (GLUCOTROL XL) 5 MG 24 hr tablet, , Disp: , Rfl:    insulin aspart (NOVOLOG) 100 UNIT/ML injection, Inject into the skin 3 (three) times daily before meals., Disp: , Rfl:    insulin glargine (LANTUS) 100 UNIT/ML injection, Inject 44 Units into the skin daily., Disp: , Rfl:    Insulin Pen Needle (COMFORT EZ PEN NEEDLES) 32G X 8 MM MISC, See admin instructions., Disp: , Rfl:    isosorbide mononitrate (IMDUR) 30 MG 24 hr tablet, Take by mouth., Disp: , Rfl:    JARDIANCE 10 MG TABS tablet, , Disp: , Rfl:    LANTUS SOLOSTAR 100 UNIT/ML Solostar Pen, SMARTSIG:44 Unit(s) SUB-Q Every Night, Disp: , Rfl:    losartan (COZAAR) 100 MG tablet, , Disp: , Rfl:    metFORMIN (GLUCOPHAGE) 1000 MG tablet, , Disp: , Rfl:    metFORMIN (GLUCOPHAGE-XR) 500 MG 24 hr tablet, Take by mouth. (Patient not taking: Reported on 01/26/2022), Disp: , Rfl:    NOVOLOG FLEXPEN 100 UNIT/ML FlexPen, INJ UP TO 50 UNI D [place] IN MULTIPLE INJECTIONS B MEALS UTD, Disp: , Rfl:    predniSONE (DELTASONE) 10 MG tablet, take 1 tab three times a day for 2 days, 1 tab twice a day for 5 days, 1 tab daily till finished (Patient not taking: Reported on 01/26/2022), Disp: , Rfl:    spironolactone (ALDACTONE) 25 MG tablet, ,  Disp: , Rfl:    urea (CARMOL) 10 % cream, Apply topically., Disp: , Rfl:    valsartan (DIOVAN) 160 MG tablet, Take by mouth. (Patient not taking: Reported on 01/26/2022), Disp: , Rfl:    valsartan (DIOVAN) 320 MG tablet, , Disp: , Rfl:   Past Medical History: Past Medical History:  Diagnosis Date   Arthritis    CHF (congestive heart failure), NYHA class II, acute on chronic, systolic (HCC)    EF 43% 03/5398   Coronary artery disease    [facility]   Diabetes mellitus without complication ([place])    Dysplastic nevus 08/26/2020   L flank, moderate atypia   Hyperlipidemia    Hypertension    Melanoma ([place]) 08/20/2015   Right neck. Superficial spreading, arising in nevus. Tumor  thickness 1.14m, Anatomic level IV   Melanoma in situ ([facility] 05/22/2020   R lateral neck ant to scar, exc 05/22/20   Numbness in right leg    due to back   Skin cancer    removed l arm   Spinal stenosis    Stented coronary artery 2000   X 1 STENT   Swelling of both lower extremities     Tobacco Use: Social History   Tobacco Use  Smoking Status Never  Smokeless Tobacco Never    Labs: Review Flowsheet       Latest Ref Rng & Units 09/01/2006 03/31/2011 04/13/2012  Labs for ITP Cardiac and Pulmonary Rehab  Hemoglobin A1c <5.7 % 9.6 (NOTE)   The ADA recommends the following therapeutic goals for glycemic   control related to Hgb A1C measurement:   Goal of Therapy:   < 7.0% Hgb A1C   Action Suggested:  > 8.0% Hgb A1C   Ref:  Diabetes Care, 22, Suppl. 1, 1999  11.2  8.1      Pulmonary Assessment Scores:  Pulmonary Assessment Scores     Row Name 02/02/22 1722         ADL UCSD   ADL Phase Entry     SOB Score total 58     Rest 0     Walk 2     Stairs 5     Bath 1     Dress 2     Shop 1       CAT Score   CAT Score 25       mMRC Score   mMRC Score 4              UCSD: Self-administered rating of dyspnea associated with activities of daily living (ADLs) 6-point scale (0 = "not at all" to 5 = "maximal or unable to do because of breathlessness")  Scoring Scores range from 0 to 120.  Minimally important difference is 5 units  CAT: CAT can identify the health impairment of COPD patients and is better correlated with disease progression.  CAT has a scoring range of zero to 40. The CAT score is classified into four groups of low (less than 10), medium (10 - 20), high (21-30) and very high (31-40) based on the impact level of disease on health status. A CAT score over 10 suggests significant symptoms.  A worsening CAT score could be explained by an exacerbation, poor medication adherence, poor inhaler technique, or progression of COPD or comorbid conditions.  CAT MCID is  2 points  mMRC: mMRC (Modified Medical Research Council) Dyspnea Scale is used to assess the degree of baseline functional disability in patients of respiratory disease due  to dyspnea. No minimal important difference is established. A decrease in score of 1 point or greater is considered a positive change.   Pulmonary Function Assessment:  Pulmonary Function Assessment - 01/26/22 1111       Breath   Shortness of Breath Yes;Limiting activity             Exercise Target Goals: Exercise Program Goal: Individual exercise prescription set using results from initial 6 min walk test and THRR while considering  patient's activity barriers and safety.   Exercise Prescription Goal: Initial exercise prescription builds to 30-45 minutes a day of aerobic activity, 2-3 days per week.  Home exercise guidelines will be given to patient during program as part of exercise prescription that the participant will acknowledge.  Education: Aerobic Exercise: - Group verbal and visual presentation on the components of exercise prescription. Introduces F.I.T.T principle from ACSM for exercise prescriptions.  Reviews F.I.T.T. principles of aerobic exercise including progression. Written material given at graduation.   Education: Resistance Exercise: - Group verbal and visual presentation on the components of exercise prescription. Introduces F.I.T.T principle from ACSM for exercise prescriptions  Reviews F.I.T.T. principles of resistance exercise including progression. Written material given at graduation.    Education: Exercise & Equipment Safety: - Individual verbal instruction and demonstration of equipment use and safety with use of the equipment. Flowsheet Row Pulmonary Rehab from 02/02/2022 in [facility] Cardiac and Pulmonary Rehab  Date 02/02/22  Educator [facility]  Instruction Review Code 1- Verbalizes Understanding       Education: Exercise Physiology & General Exercise Guidelines: - Group verbal and  written instruction with models to review the exercise physiology of the cardiovascular system and associated critical values. Provides general exercise guidelines with specific guidelines to those with heart or lung disease.    Education: Flexibility, Balance, Mind/Body Relaxation: - Group verbal and visual presentation with interactive activity on the components of exercise prescription. Introduces F.I.T.T principle from ACSM for exercise prescriptions. Reviews F.I.T.T. principles of flexibility and balance exercise training including progression. Also discusses the mind body connection.  Reviews various relaxation techniques to help reduce and manage stress (i.e. Deep breathing, progressive muscle relaxation, and visualization). Balance handout provided to take home. Written material given at graduation.   Activity Barriers & Risk Stratification:   6 Minute Walk:  6 Minute Walk     Row Name 02/02/22 1701         6 Minute Walk   Phase Initial     Distance 480 feet     Walk Time 5.94 minutes     # of Rest Breaks 2     MPH 0.91     METS 0.8     RPE 15     Perceived Dyspnea  3     VO2 Peak 3     Symptoms No     Resting HR 96 bpm     Resting BP 158/90     Resting Oxygen Saturation  94 %     Exercise Oxygen Saturation  during 6 min walk 90 %     Max Ex. HR 120 bpm     Max Ex. BP 160/80     2 Minute Post BP 140/70       Interval HR   1 Minute HR 98     2 Minute HR 110     3 Minute HR 112     4 Minute HR 118     5 Minute HR 119  6 Minute HR 120     2 Minute Post HR 100     Interval Heart Rate? Yes       Interval Oxygen   Interval Oxygen? Yes     Baseline Oxygen Saturation % 94 %     1 Minute Oxygen Saturation % 90 %     1 Minute Liters of Oxygen 0 L     2 Minute Oxygen Saturation % 90 %     2 Minute Liters of Oxygen 0 L     3 Minute Oxygen Saturation % 91 %     3 Minute Liters of Oxygen 0 L     4 Minute Oxygen Saturation % 92 %     4 Minute Liters of Oxygen 0 L      5 Minute Oxygen Saturation % 92 %     5 Minute Liters of Oxygen 0 L     6 Minute Oxygen Saturation % 93 %     6 Minute Liters of Oxygen 0 L     2 Minute Post Oxygen Saturation % 98 %     2 Minute Post Liters of Oxygen 0 L             Oxygen Initial Assessment:  Oxygen Initial Assessment - 02/02/22 1721       Home Oxygen   Home Oxygen Device None    Sleep Oxygen Prescription None    Home Exercise Oxygen Prescription None    Home Resting Oxygen Prescription None      Initial 6 min Walk   Oxygen Used None      Program Oxygen Prescription   Program Oxygen Prescription None      Intervention   Short Term Goals To learn and understand importance of maintaining oxygen saturations>88%;To learn and understand importance of monitoring SPO2 with pulse oximeter and demonstrate accurate use of the pulse oximeter.;To learn and demonstrate proper pursed lip breathing techniques or other breathing techniques. ;To learn and exhibit compliance with exercise, home and travel O2 prescription    Long  Term Goals Verbalizes importance of monitoring SPO2 with pulse oximeter and return demonstration;Exhibits proper breathing techniques, such as pursed lip breathing or other method taught during program session;Maintenance of O2 saturations>88%;Exhibits compliance with exercise, home  and travel O2 prescription             Oxygen Re-Evaluation:   Oxygen Discharge (Final Oxygen Re-Evaluation):   Initial Exercise Prescription:  Initial Exercise Prescription - 02/02/22 1700       Date of Initial Exercise RX and Referring Provider   Date 02/02/22    Referring Provider [name]      Oxygen   Maintain Oxygen Saturation 88% or higher      Treadmill   MPH 0.8    Grade 0    Minutes 15    METs 1.6      NuStep   Level 1    SPM 80    Minutes 15    METs 1      REL-XR   Level 1    Speed 50    Minutes 15    METs 1      T5 Nustep   Level 1    SPM 80    Minutes 15    METs 1       Biostep-RELP   Level 1    SPM 50    Minutes 15    METs 1      Prescription Details  Frequency (times per week) 3    Duration Progress to 30 minutes of continuous aerobic without signs/symptoms of physical distress      Intensity   THRR 40-80% of Max Heartrate 116-136    Ratings of Perceived Exertion 11-13    Perceived Dyspnea 0-4      Progression   Progression Continue to progress workloads to maintain intensity without signs/symptoms of physical distress.      Resistance Training   Training Prescription Yes    Weight 2    Reps 10-15             Perform Capillary Blood Glucose checks as needed.  Exercise Prescription Changes:   Exercise Prescription Changes     Row Name 02/02/22 1700             Response to Exercise   Blood Pressure (Admit) 158/90       Blood Pressure (Exercise) 160/80       Blood Pressure (Exit) 140/70       Heart Rate (Admit) 96 bpm       Heart Rate (Exercise) 120 bpm       Heart Rate (Exit) 100 bpm       Oxygen Saturation (Admit) 94 %       Oxygen Saturation (Exercise) 90 %       Oxygen Saturation (Exit) 98 %       Rating of Perceived Exertion (Exercise) 15       Perceived Dyspnea (Exercise) 3       Symptoms none       Comments 6 MWT results                Exercise Comments:   Exercise Goals and Review:   Exercise Goals     Row Name 02/02/22 1713             Exercise Goals   Increase Physical Activity Yes       Intervention Provide advice, education, support and counseling about physical activity/exercise needs.;Develop an individualized exercise prescription for aerobic and resistive training based on initial evaluation findings, risk stratification, comorbidities and participant's personal goals.       Expected Outcomes Short Term: Attend rehab on a regular basis to increase amount of physical activity.;Long Term: Add in home exercise to make exercise part of routine and to increase amount of physical  activity.;Long Term: Exercising regularly at least 3-5 days a week.       Increase Strength and Stamina Yes       Intervention Provide advice, education, support and counseling about physical activity/exercise needs.;Develop an individualized exercise prescription for aerobic and resistive training based on initial evaluation findings, risk stratification, comorbidities and participant's personal goals.       Expected Outcomes Short Term: Increase workloads from initial exercise prescription for resistance, speed, and METs.;Short Term: Perform resistance training exercises routinely during rehab and add in resistance training at home;Long Term: Improve cardiorespiratory fitness, muscular endurance and strength as measured by increased METs and functional capacity (6MWT)       Able to understand and use rate of perceived exertion (RPE) scale Yes       Intervention Provide education and explanation on how to use RPE scale       Expected Outcomes Short Term: Able to use RPE daily in rehab to express subjective intensity level;Long Term:  Able to use RPE to guide intensity level when exercising independently       Able to understand and  use Dyspnea scale Yes       Intervention Provide education and explanation on how to use Dyspnea scale       Expected Outcomes Short Term: Able to use Dyspnea scale daily in rehab to express subjective sense of shortness of breath during exertion;Long Term: Able to use Dyspnea scale to guide intensity level when exercising independently       Knowledge and understanding of Target Heart Rate Range (THRR) Yes       Intervention Provide education and explanation of THRR including how the numbers were predicted and where they are located for reference       Expected Outcomes Short Term: Able to state/look up THRR;Long Term: Able to use THRR to govern intensity when exercising independently;Short Term: Able to use daily as guideline for intensity in rehab       Able to check  pulse independently Yes       Intervention Provide education and demonstration on how to check pulse in carotid and radial arteries.;Review the importance of being able to check your own pulse for safety during independent exercise       Expected Outcomes Short Term: Able to explain why pulse checking is important during independent exercise;Long Term: Able to check pulse independently and accurately       Understanding of Exercise Prescription Yes       Intervention Provide education, explanation, and written materials on patient's individual exercise prescription       Expected Outcomes Short Term: Able to explain program exercise prescription;Long Term: Able to explain home exercise prescription to exercise independently                Exercise Goals Re-Evaluation :   Discharge Exercise Prescription (Final Exercise Prescription Changes):  Exercise Prescription Changes - 02/02/22 1700       Response to Exercise   Blood Pressure (Admit) 158/90    Blood Pressure (Exercise) 160/80    Blood Pressure (Exit) 140/70    Heart Rate (Admit) 96 bpm    Heart Rate (Exercise) 120 bpm    Heart Rate (Exit) 100 bpm    Oxygen Saturation (Admit) 94 %    Oxygen Saturation (Exercise) 90 %    Oxygen Saturation (Exit) 98 %    Rating of Perceived Exertion (Exercise) 15    Perceived Dyspnea (Exercise) 3    Symptoms none    Comments 6 MWT results             Nutrition:  Target Goals: Understanding of nutrition guidelines, daily intake of sodium '1500mg'$ , cholesterol '200mg'$ , calories 30% from fat and 7% or less from saturated fats, daily to have 5 or more servings of fruits and vegetables.  Education: All About Nutrition: -Group instruction provided by verbal, written material, interactive activities, discussions, models, and posters to present general guidelines for heart healthy nutrition including fat, fiber, MyPlate, the role of sodium in heart healthy nutrition, utilization of the nutrition  label, and utilization of this knowledge for meal planning. Follow up email sent as well. Written material given at graduation. Flowsheet Row Pulmonary Rehab from 02/02/2022 in [facility] Cardiac and Pulmonary Rehab  Education need identified 02/02/22       Biometrics:  Pre Biometrics - 02/02/22 1714       Pre Biometrics   Height 5' 10.75" (1.797 m)    Weight 307 lb 11.2 oz (139.6 kg)    Waist Circumference 56 inches    Hip Circumference 54 inches    Waist to  Hip Ratio 1.04 %    BMI (Calculated) 43.22    Single Leg Stand 0 seconds              Nutrition Therapy Plan and Nutrition Goals:  Nutrition Therapy & Goals - 02/02/22 1607       Nutrition Therapy   Diet Heart healthy, low Na, T2DM    Drug/Food Interactions Statins/Certain Fruits    Protein (specify units) 80-85g    Fiber 30 grams    Whole Grain Foods 3 servings    Saturated Fats 16 max. grams    Fruits and Vegetables 8 servings/day    Sodium 2 grams      Personal Nutrition Goals   Nutrition Goal ST: practice building balanced meals with fiber, healthy fat, and protein. Continue to limit sodium. LT: limit Na <2g/day, follow MyPlate guidelines, manage A1C <7%    Comments 71 y.o. M admitted to pulmonary rehab for heart failure. PMHx includes T2DM, HTN, CAD, HLD, CKD stg 2. Relevant medications includes lipitor, ferrous sulfate, vitamin C, B-12, furosemide, novolog, lantus. He has a continuous meter and uses his short acting insulin using his current BG. [name] reports living with his son and his granddaughter. he is now drinking mostly water and limiting sodium. B: coffee with doughnut or muffin or poptart and some water L: leftovers, sandwich (pimento cheese or peanut butter - used to have deli meat) D: fruit - apples or sandwich (pimento cheese or peanut butter - used to have deli meat), non-starchy vegetables ("anything and everything" - he enjoys going to the farmers market), potatoes, steak, chicken, fish (cod). He cooks  meat with olive oil on pan - hes getting used ot an air fryer. S: does not snack late at night anymore. Sometimes he rpeorts just hvaing a baked potato to eat. Discussed MyPlate and balancing meals as well as heart healthy eating and T2DM MNT.      Intervention Plan   Intervention Prescribe, educate and counsel regarding individualized specific dietary modifications aiming towards targeted core components such as weight, hypertension, lipid management, diabetes, heart failure and other comorbidities.;Nutrition handout(s) given to patient.    Expected Outcomes Short Term Goal: Understand basic principles of dietary content, such as calories, fat, sodium, cholesterol and nutrients.;Short Term Goal: A plan has been developed with personal nutrition goals set during dietitian appointment.;Long Term Goal: Adherence to prescribed nutrition plan.             Nutrition Assessments:  MEDIFICTS Score Key: ?70 Need to make dietary changes  40-70 Heart Healthy Diet ? 40 Therapeutic Level Cholesterol Diet   Picture Your Plate Scores: <82 Unhealthy dietary pattern with much room for improvement. 41-50 Dietary pattern unlikely to meet recommendations for good health and room for improvement. 51-60 More healthful dietary pattern, with some room for improvement.  >60 Healthy dietary pattern, although there may be some specific behaviors that could be improved.   Nutrition Goals Re-Evaluation:   Nutrition Goals Discharge (Final Nutrition Goals Re-Evaluation):   Psychosocial: Target Goals: Acknowledge presence or absence of significant depression and/or stress, maximize coping skills, provide positive support system. Participant is able to verbalize types and ability to use techniques and skills needed for reducing stress and depression.   Education: Stress, Anxiety, and Depression - Group verbal and visual presentation to define topics covered.  Reviews how body is impacted by stress, anxiety, and  depression.  Also discusses healthy ways to reduce stress and to treat/manage anxiety and depression.  Written material given  at graduation. Flowsheet Row Pulmonary Rehab from 02/02/2022 in [facility] Cardiac and Pulmonary Rehab  Education need identified 02/02/22       Education: Sleep Hygiene -Provides group verbal and written instruction about how sleep can affect your health.  Define sleep hygiene, discuss sleep cycles and impact of sleep habits. Review good sleep hygiene tips.    Initial Review & Psychosocial Screening:  Initial Psych Review & Screening - 01/26/22 1113       Initial Review   Current issues with None Identified      Family Dynamics   Good Support System? Yes    Comments He can look to his son and grandaughter for support. He is concerned about his health due to his fluid build up and shortness of breath.      Barriers   Psychosocial barriers to participate in program The patient should benefit from training in stress management and relaxation.      Screening Interventions   Interventions Encouraged to exercise;To provide support and resources with identified psychosocial needs;Provide feedback about the scores to participant    Expected Outcomes Long Term Goal: Stressors or current issues are controlled or eliminated.;Short Term goal: Utilizing psychosocial counselor, staff and physician to assist with identification of specific Stressors or current issues interfering with healing process. Setting desired goal for each stressor or current issue identified.;Short Term goal: Identification and review with participant of any Quality of Life or Depression concerns found by scoring the questionnaire.;Long Term goal: The participant improves quality of Life and PHQ9 Scores as seen by post scores and/or verbalization of changes             Quality of Life Scores:  Quality of Life - 02/02/22 1716       Quality of Life   Select Quality of Life      Quality of Life  Scores   Health/Function Pre 13.38 %    Socioeconomic Pre 25.5 %    Psych/Spiritual Pre 29.14 %    Family Pre 30 %    GLOBAL Pre 21.43 %            Scores of 19 and below usually indicate a poorer quality of life in these areas.  A difference of  2-3 points is a clinically meaningful difference.  A difference of 2-3 points in the total score of the Quality of Life Index has been associated with significant improvement in overall quality of life, self-image, physical symptoms, and general health in studies assessing change in quality of life.  PHQ-9: Review Flowsheet       02/02/2022 10/16/2021  Depression screen PHQ 2/9  Decreased Interest 0 0  Down, Depressed, Hopeless 0 0  PHQ - 2 Score 0 0  Altered sleeping 0 -  Tired, decreased energy 3 -  Change in appetite 2 -  Feeling bad or failure about yourself  0 -  Trouble concentrating 0 -  Moving slowly or fidgety/restless 1 -  Suicidal thoughts 0 -  PHQ-9 Score 6 -  Difficult doing work/chores Somewhat difficult -   Interpretation of Total Score  Total Score Depression Severity:  1-4 = Minimal depression, 5-9 = Mild depression, 10-14 = Moderate depression, 15-19 = Moderately severe depression, 20-27 = Severe depression   Psychosocial Evaluation and Intervention:  Psychosocial Evaluation - 01/26/22 1115       Psychosocial Evaluation & Interventions   Interventions Stress management education;Relaxation education;Encouraged to exercise with the program and follow exercise prescription    Comments  He can look to his son and grandaughter for support. He is concerned about his health due to his fluid build up and shortness of breath.    Expected Outcomes Short: Start [facility] to help with mood. Long: Maintain a healthy mental state.    Continue Psychosocial Services  Follow up required by staff             Psychosocial Re-Evaluation:   Psychosocial Discharge (Final Psychosocial  Re-Evaluation):   Education: Education Goals: Education classes will be provided on a weekly basis, covering required topics. Participant will state understanding/return demonstration of topics presented.  Learning Barriers/Preferences:  Learning Barriers/Preferences - 01/26/22 1112       Learning Barriers/Preferences   Learning Barriers None    Learning Preferences None             General Pulmonary Education Topics:  Infection Prevention: - Provides verbal and written material to individual with discussion of infection control including proper hand washing and proper equipment cleaning during exercise session. Flowsheet Row Pulmonary Rehab from 02/02/2022 in [facility] Cardiac and Pulmonary Rehab  Date 02/02/22  Educator [facility]  Instruction Review Code 1- Verbalizes Understanding       Falls Prevention: - Provides verbal and written material to individual with discussion of falls prevention and safety. Flowsheet Row Pulmonary Rehab from 02/02/2022 in [facility] Cardiac and Pulmonary Rehab  Date 02/02/22  Educator [facility]  Instruction Review Code 1- Verbalizes Understanding       Chronic Lung Disease Review: - Group verbal instruction with posters, models, PowerPoint presentations and videos,  to review new updates, new respiratory medications, new advancements in procedures and treatments. Providing information on websites and "800" numbers for continued self-education. Includes information about supplement oxygen, available portable oxygen systems, continuous and intermittent flow rates, oxygen safety, concentrators, and Medicare reimbursement for oxygen. Explanation of Pulmonary Drugs, including class, frequency, complications, importance of spacers, rinsing mouth after steroid MDI's, and proper cleaning methods for nebulizers. Review of basic lung anatomy and physiology related to function, structure, and complications of lung disease. Review of risk factors. Discussion about methods for  diagnosing sleep apnea and types of masks and machines for OSA. Includes a review of the use of types of environmental controls: home humidity, furnaces, filters, dust mite/pet prevention, HEPA vacuums. Discussion about weather changes, air quality and the benefits of nasal washing. Instruction on Warning signs, infection symptoms, calling MD promptly, preventive modes, and value of vaccinations. Review of effective airway clearance, coughing and/or vibration techniques. Emphasizing that all should Create an Action Plan. Written material given at graduation.   AED/CPR: - Group verbal and written instruction with the use of models to demonstrate the basic use of the AED with the basic ABC's of resuscitation.    Anatomy and Cardiac Procedures: - Group verbal and visual presentation and models provide information about basic cardiac anatomy and function. Reviews the testing methods done to diagnose heart disease and the outcomes of the test results. Describes the treatment choices: Medical Management, Angioplasty, or Coronary Bypass Surgery for treating various heart conditions including Myocardial Infarction, Angina, Valve Disease, and Cardiac Arrhythmias.  Written material given at graduation. Flowsheet Row Pulmonary Rehab from 02/02/2022 in [facility] Cardiac and Pulmonary Rehab  Education need identified 02/02/22       Medication Safety: - Group verbal and visual instruction to review commonly prescribed medications for heart and lung disease. Reviews the medication, class of the drug, and side effects. Includes the steps to properly store meds and maintain the  prescription regimen.  Written material given at graduation.   Other: -Provides group and verbal instruction on various topics (see comments)   Knowledge Questionnaire Score:  Knowledge Questionnaire Score - 02/02/22 1720       Knowledge Questionnaire Score   Pre Score 21/26              Core Components/Risk Factors/Patient  Goals at Admission:  Personal Goals and Risk Factors at Admission - 02/02/22 1720       Core Components/Risk Factors/Patient Goals on Admission    Weight Management Yes;Weight Loss;Obesity    Intervention Weight Management: Develop a combined nutrition and exercise program designed to reach desired caloric intake, while maintaining appropriate intake of nutrient and fiber, sodium and fats, and appropriate energy expenditure required for the weight goal.;Weight Management: Provide education and appropriate resources to help participant work on and attain dietary goals.;Weight Management/Obesity: Establish reasonable short term and long term weight goals.;Obesity: Provide education and appropriate resources to help participant work on and attain dietary goals.    Admit Weight 307 lb 11.2 oz (139.6 kg)    Goal Weight: Short Term 295 lb (133.8 kg)    Goal Weight: Long Term 250 lb (113.4 kg)    Expected Outcomes Short Term: Continue to assess and modify interventions until short term weight is achieved;Long Term: Adherence to nutrition and physical activity/exercise program aimed toward attainment of established weight goal;Weight Maintenance: Understanding of the daily nutrition guidelines, which includes 25-35% calories from fat, 7% or less cal from saturated fats, less than '200mg'$  cholesterol, less than 1.5gm of sodium, & 5 or more servings of fruits and vegetables daily;Weight Loss: Understanding of general recommendations for a balanced deficit meal plan, which promotes 1-2 lb weight loss per week and includes a negative energy balance of [phone] kcal/d;Understanding recommendations for meals to include 15-35% energy as protein, 25-35% energy from fat, 35-60% energy from carbohydrates, less than '200mg'$  of dietary cholesterol, 20-35 gm of total fiber daily;Understanding of distribution of calorie intake throughout the day with the consumption of 4-5 meals/snacks    Improve shortness of breath with ADL's Yes     Intervention Provide education, individualized exercise plan and daily activity instruction to help decrease symptoms of SOB with activities of daily living.    Expected Outcomes Short Term: Improve cardiorespiratory fitness to achieve a reduction of symptoms when performing ADLs;Long Term: Be able to perform more ADLs without symptoms or delay the onset of symptoms    Diabetes Yes    Intervention Provide education about signs/symptoms and action to take for hypo/hyperglycemia.;Provide education about proper nutrition, including hydration, and aerobic/resistive exercise prescription along with prescribed medications to achieve blood glucose in normal ranges: Fasting glucose 65-99 mg/dL    Expected Outcomes Short Term: Participant verbalizes understanding of the signs/symptoms and immediate care of hyper/hypoglycemia, proper foot care and importance of medication, aerobic/resistive exercise and nutrition plan for blood glucose control.;Long Term: Attainment of HbA1C < 7%.    Heart Failure Yes    Intervention Provide a combined exercise and nutrition program that is supplemented with education, support and counseling about heart failure. Directed toward relieving symptoms such as shortness of breath, decreased exercise tolerance, and extremity edema.    Expected Outcomes Improve functional capacity of life;Short term: Attendance in program 2-3 days a week with increased exercise capacity. Reported lower sodium intake. Reported increased fruit and vegetable intake. Reports medication compliance.;Short term: Daily weights obtained and reported for increase. Utilizing diuretic protocols set by physician.;Long term: Adoption of  self-care skills and reduction of barriers for early signs and symptoms recognition and intervention leading to self-care maintenance.    Hypertension Yes    Intervention Provide education on lifestyle modifcations including regular physical activity/exercise, weight management,  moderate sodium restriction and increased consumption of fresh fruit, vegetables, and low fat dairy, alcohol moderation, and smoking cessation.;Monitor prescription use compliance.    Expected Outcomes Short Term: Continued assessment and intervention until BP is < 140/29m HG in hypertensive participants. < 130/895mHG in hypertensive participants with diabetes, heart failure or chronic kidney disease.;Long Term: Maintenance of blood pressure at goal levels.    Lipids Yes    Intervention Provide education and support for participant on nutrition & aerobic/resistive exercise along with prescribed medications to achieve LDL '70mg'$ , HDL >'40mg'$ .    Expected Outcomes Short Term: Participant states understanding of desired cholesterol values and is compliant with medications prescribed. Participant is following exercise prescription and nutrition guidelines.;Long Term: Cholesterol controlled with medications as prescribed, with individualized exercise RX and with personalized nutrition plan. Value goals: LDL < '70mg'$ , HDL > 40 mg.             Education:Diabetes - Individual verbal and written instruction to review signs/symptoms of diabetes, desired ranges of glucose level fasting, after meals and with exercise. Acknowledge that pre and post exercise glucose checks will be done for 3 sessions at entry of program. [facility]  Date 02/02/22  [facility] Code 1- Verbalizes Understanding       Know Your Numbers and Heart Failure: - Group verbal and visual instruction to discuss disease risk factors for cardiac and pulmonary disease and treatment options.  Reviews associated critical values for Overweight/Obesity, Hypertension, Cholesterol, and Diabetes.  Discusses basics of heart failure: signs/symptoms and treatments.  Introduces Heart Failure Zone chart for action plan for heart failure.  Written material given at  graduation. [facility]  Education need identified 02/02/22       Core Components/Risk Factors/Patient Goals Review:    Core Components/Risk Factors/Patient Goals at Discharge (Final Review):    ITP Comments:  ITP Comments     Row Name 01/26/22 1110 02/02/22 1701 02/11/22 1051       ITP Comments Virtual Visit completed. Patient informed on EP and RD appointment and 6 Minute walk test. Patient also informed of patient health questionnaires on My Chart. Patient Verbalizes understanding. [facility] Completed 6MWT and gym orientation. Initial ITP created and sent for review to Dr. FaZetta BillsMedical Director. 30 Day review completed. Medical Director ITP review done, changes made as directed, and signed approval by Medical Director.   new to program              Comments:

## 2022-02-12 ENCOUNTER — Encounter: Payer: Self-pay | Admitting: *Deleted

## 2022-02-12 ENCOUNTER — Ambulatory Visit: Payer: Self-pay | Admitting: *Deleted

## 2022-02-12 NOTE — Patient Outreach (Signed)
  Care Coordination   Initial Visit Note   02/12/2022 Name: [name] MRN: [id number] DOB: April 24, 1950  [name] is a 71 y.o. year old male who sees Andrew Pitch, MD for primary care. I spoke with  [name] by phone today.  What matters to the patients health and wellness today?  Having some swelling and shortness of breath intermittently.  History of CHF.     Goals Addressed             This Visit's Progress    Effective management of CHF       Care Coordination Interventions: Basic overview and discussion of pathophysiology of Heart Failure reviewed Provided education on low sodium diet Reviewed Heart Failure Action Plan in depth and provided written copy Assessed need for readable accurate scales in home Provided education about placing scale on hard, flat surface Discussed importance of daily weight and advised patient to weigh and record daily Reviewed role of diuretics in prevention of fluid overload and management of heart failure; Discussed the importance of keeping all appointments with provider Assessed social determinant of health barriers  Report he has swelling in extremities, does not wear compression socks. Encouraged to do so and to elevate legs when sitting. Follows up with cardiology (last 10/10, goes every 3 months), endocrinology (last 9/14), oncology (last 9/12), nephrology (last 11/14), and PCP on a regular basis.   Now active with pulmonary/cardiac rehab, will start 12/4.  Denies need for transportation.         SDOH assessments and interventions completed:  Yes  SDOH Interventions Today    Flowsheet Row Most Recent Value  SDOH Interventions   Food Insecurity Interventions Intervention Not Indicated  Housing Interventions Intervention Not Indicated  Transportation Interventions Intervention Not Indicated  Utilities Interventions Intervention Not Indicated        Care Coordination Interventions:  Yes, provided   Follow up  plan: Follow up call scheduled for 12/29    Encounter Outcome:  Pt. Visit Completed   Andrew David, RN, MSN, [place] Care Management Care Management Coordinator [phone]

## 2022-02-12 NOTE — Patient Instructions (Signed)
Visit Information  Thank you for taking time to visit with me today. Please don't hesitate to contact me if I can be of assistance to you before our next scheduled telephone appointment.  Following are the goals we discussed today:  Weight self daily, record readings to share with provider.  Review attached CHF action plan.  Our next appointment is by telephone on 12/29  Please call the care guide team at [phone] if you need to cancel or reschedule your appointment.   Please call the Suicide and Crisis Lifeline: 988 call the [place] National Suicide Prevention Lifeline: 216 640 4666 or TTY: (279) 177-0229 TTY (516)029-6396) to talk to a trained counselor call 1-800-273-TALK (toll free, 24 hour hotline) call 911 if you are experiencing a Mental Health or [place] or need someone to talk to.  Patient verbalizes understanding of instructions and care plan provided today and agrees to view in [place]. Active MyChart status and patient understanding of how to access instructions and care plan via MyChart confirmed with patient.     The patient has been provided with contact information for the care management team and has been advised to call with any health related questions or concerns.   Valente David, RN, MSN, [place] Care Management Care Management Coordinator [phone]

## 2022-02-13 ENCOUNTER — Encounter: Payer: Self-pay | Admitting: Oncology

## 2022-02-16 ENCOUNTER — Encounter: Payer: Medicare HMO | Attending: Internal Medicine | Admitting: *Deleted

## 2022-02-16 DIAGNOSIS — I5022 Chronic systolic (congestive) heart failure: Secondary | ICD-10-CM | POA: Diagnosis not present

## 2022-02-16 NOTE — Progress Notes (Signed)
Daily Session Note  Patient Details  Name: [name] MRN: [id number] Date of Birth: [birth year] Referring Provider:   Flowsheet Row Pulmonary Rehab from 02/02/2022 in [facility] Cardiac and Pulmonary Rehab  Referring Provider [name]       Encounter Date: 02/16/2022  Check In:  Session Check In - 02/16/22 1019       Check-In   Supervising physician immediately available to respond to emergencies See telemetry face sheet for immediately available ER MD    Location [facility]-Cardiac & Pulmonary Rehab    Staff Present Darlyne Russian, RN, Doyce Para, BS, ACSM CEP, Exercise Physiologist;Noah Tickle, BS, Exercise Physiologist    Virtual Visit No    Medication changes reported     No    Fall or balance concerns reported    No    Warm-up and Cool-down Performed on first and last piece of equipment    Resistance Training Performed Yes    VAD Patient? No    PAD/SET Patient? No      Pain Assessment   Currently in Pain? No/denies                Social History   Tobacco Use  Smoking Status Never  Smokeless Tobacco Never    Goals Met:  Independence with exercise equipment Exercise tolerated well No report of concerns or symptoms today Strength training completed today  Goals Unmet:  Not Applicable  Comments: First full day of exercise!  Patient was oriented to gym and equipment including functions, settings, policies, and procedures.  Patient's individual exercise prescription and treatment plan were reviewed.  All starting workloads were established based on the results of the 6 minute walk test done at initial orientation visit.  The plan for exercise progression was also introduced and progression will be customized based on patient's performance and goals.    Dr. Emily Filbert is Medical Director for [facility].  Dr. Ottie Glazier is Medical Director for [facility] Pulmonary [facility].

## 2022-02-17 DIAGNOSIS — E1129 Type 2 diabetes mellitus with other diabetic kidney complication: Secondary | ICD-10-CM | POA: Diagnosis not present

## 2022-02-17 DIAGNOSIS — E1159 Type 2 diabetes mellitus with other circulatory complications: Secondary | ICD-10-CM | POA: Diagnosis not present

## 2022-02-17 DIAGNOSIS — R809 Proteinuria, unspecified: Secondary | ICD-10-CM | POA: Diagnosis not present

## 2022-02-18 ENCOUNTER — Other Ambulatory Visit: Payer: Self-pay | Admitting: Student

## 2022-02-18 DIAGNOSIS — R809 Proteinuria, unspecified: Secondary | ICD-10-CM

## 2022-02-18 NOTE — Progress Notes (Signed)
Patient for [name] Biopsy on Thurs 02/19/2022, I called and spoke with the patient on the phone and gave pre-procedure instructions. PT was made aware to be here at 7:30a at the new entrance, last dose of Aspirin '81mg'$  was Friday 02/13/2022, NPO after MN prior to procedure as well as driver post procedure/recovery/discharge. Pt stated understanding.  Called 02/13/2022 and 02/18/2022

## 2022-02-18 NOTE — H&P (Signed)
Chief Complaint: Patient was seen in consultation today for renal biopsy at the request of Singh,Harmeet  Referring Physician(s): Singh,Harmeet  Supervising Physician: Daryll Brod  Patient Status: [facility] - Out-pt  History of Present Illness: [name] is a 71 y.o. male with PMHx of diabetes, HTN, CAD, CKD with history of AKI in 2013 requiring HD treatments. Patient follows with nephrology now with proteinuria in nephrotic range and concern for membraneous nephropathy. The patient has had a renal ultrasound performed on 11/17. Request received for renal biopsy to determine if renal disease is secondary to diabetic nephropathy versus membraneous nephropathy.   The patient denies any current chest pain, flank or back pain or shortness of breath, he has chronic dyspnea on exertion which is not changed. He denies any current blood thinner use and has stopped aspirin last week > 5 days ago, denies any known bleeding or clotting disorder. The patient denies any recent infections, fever or chills. The patient denies any history of sleep apnea or chronic oxygen use. He has no known complications to sedation.    Past Medical History:  Diagnosis Date   Arthritis    CHF (congestive heart failure), NYHA class II, acute on chronic, systolic (HCC)    EF 85% 08/3147   Coronary artery disease    [facility]   Diabetes mellitus without complication ([place])    Dysplastic nevus 08/26/2020   L flank, moderate atypia   Hyperlipidemia    Hypertension    Melanoma ([place]) 08/20/2015   Right neck. Superficial spreading, arising in nevus. Tumor thickness 1.53m, Anatomic level IV   Melanoma in situ ([place] 05/22/2020   R lateral neck ant to scar, exc 05/22/20   Numbness in right leg    due to back   Skin cancer    removed l arm   Spinal stenosis    Stented coronary artery 2000   X 1 STENT   Swelling of both lower extremities     Past Surgical History:  Procedure Laterality Date    CATARACT EXTRACTION W/PHACO Right 05/03/2019   Procedure: CATARACT EXTRACTION PHACO AND INTRAOCULAR LENS PLACEMENT (IOC) RIGHT DIABETIC 6.44 00:58.4 11.0%;  Surgeon: BLeandrew Koyanagi MD;  [facility]  Service: Ophthalmology;  Laterality: Right;  DIABETIC   CATARACT EXTRACTION W/PHACO Left 06/28/2019   Procedure: CATARACT EXTRACTION PHACO AND INTRAOCULAR LENS PLACEMENT (IOC) LEFT DIABETIC 7.99  01:13.4  10.9% ;  Surgeon: BLeandrew Koyanagi MD;  [facility]  Service: Ophthalmology;  Laterality: Left;  Diabetic - insulin and oral meds   CHOLECYSTECTOMY     JOINT REPLACEMENT     RT KNEE   LUMBAR LAMINECTOMY/DECOMPRESSION MICRODISCECTOMY  04/13/2012   Procedure: LUMBAR LAMINECTOMY/DECOMPRESSION MICRODISCECTOMY 1 LEVEL;  Surgeon: JJohnn Hai MD;  Location: [facility];  Service: Orthopedics;  Laterality: Bilateral;  L4-L5   UVULECTOMY  1990's    Allergies: Metoprolol, Toprol xl [metoprolol tartrate], Buprenorphine, Buprenorphine hcl, Morphine, Morphine and related, Triamcinolone, and Triamcinolone acetonide  Medications: Prior to Admission medications   Medication Sig Start Date End Date Taking? Authorizing Provider  amLODipine (NORVASC) 5 MG tablet Take 5 mg by mouth daily. 08/15/19   [provider]  aspirin 325 MG tablet Take 1 tablet (325 mg total) by mouth daily. Patient not taking: Reported on 01/26/2022 04/17/12   BCecilie Kicks PA-C  aspirin EC 81 MG tablet Take by mouth.    [provider]  atorvastatin (LIPITOR) 80 MG tablet Take 80 mg by mouth daily.  09/11/19   [provider]  carvedilol (COREG) 6.25 MG tablet Take 6.25 mg by mouth 2 (two) times daily. Patient not taking: Reported on 11/25/2021 10/30/21   [provider]  Continuous Blood Gluc Receiver (DEXCOM G6 RECEIVER) DEVI Use to monitor blood sugar. [facility] [id number] 11/15/19   [provider]  Continuous Blood Gluc Sensor (DEXCOM G6 SENSOR)  MISC Use to monitor blood sugar.  Replace every 10 days. [place] [id number]. 11/15/19   [provider]  Continuous Blood Gluc Transmit (DEXCOM G6 TRANSMITTER) MISC Use to monitor blood sugar.  Replace every 3 months. [place] [id number]. 11/15/19   [provider]  FEROSUL 325 (65 Fe) MG tablet Take 1 tablet (325 mg total) by mouth 2 (two) times daily. Patient not taking: Reported on 01/26/2022 05/05/21   Lloyd Huger, MD  ferrous sulfate (FEROSUL) 325 (65 FE) MG tablet Take by mouth. 10/26/20   [provider]  furosemide (LASIX) 20 MG tablet Take by mouth. Patient not taking: Reported on 01/26/2022 10/11/19   [provider]  furosemide (LASIX) 40 MG tablet  01/04/22   [provider]  furosemide (LASIX) 80 MG tablet Take by mouth. Patient not taking: Reported on 01/26/2022 12/23/21 12/23/22  [provider]  glipiZIDE (GLUCOTROL XL) 5 MG 24 hr tablet     [provider]  insulin aspart (NOVOLOG) 100 UNIT/ML injection Inject into the skin 3 (three) times daily before meals.    [provider]  insulin glargine (LANTUS) 100 UNIT/ML injection Inject 44 Units into the skin daily.    [provider]  Insulin Pen Needle (COMFORT EZ PEN NEEDLES) 32G X 8 MM MISC See admin instructions. 05/21/20   [provider]  isosorbide mononitrate (IMDUR) 30 MG 24 hr tablet Take by mouth. 05/10/19 05/09/20  [provider]  JARDIANCE 10 MG TABS tablet  08/01/21   [provider]  LANTUS SOLOSTAR 100 UNIT/ML Solostar Pen SMARTSIG:44 Unit(s) SUB-Q Every Night 10/12/19   [provider]  losartan (COZAAR) 100 MG tablet     [provider]  metFORMIN (GLUCOPHAGE) 1000 MG tablet     [provider]  metFORMIN (GLUCOPHAGE-XR) 500 MG 24 hr tablet Take by mouth. Patient not taking: Reported on 01/26/2022 11/27/21 11/27/22  [provider]  NOVOLOG FLEXPEN 100 UNIT/ML FlexPen INJ UP TO 50  UNI D [place] IN MULTIPLE INJECTIONS B MEALS UTD 11/27/21   [provider]  predniSONE (DELTASONE) 10 MG tablet take 1 tab three times a day for 2 days, 1 tab twice a day for 5 days, 1 tab daily till finished Patient not taking: Reported on 01/26/2022    [provider]  spironolactone (ALDACTONE) 25 MG tablet  06/06/21   [provider]  urea (CARMOL) 10 % cream Apply topically. 01/13/22 01/13/23  [provider]  valsartan (DIOVAN) 160 MG tablet Take by mouth. Patient not taking: Reported on 01/26/2022 04/30/21 04/30/22  [provider]  valsartan (DIOVAN) 320 MG tablet  12/23/21   [provider]    History reviewed. No pertinent family history.  Social History   Socioeconomic History   Marital status: Widowed    Spouse name: Not on file   Number of children: Not on file   Years of education: Not on file   Highest education level: Not on file  Occupational History   Not on file  Tobacco Use   Smoking status: Never   Smokeless tobacco: Never  Vaping Use  Vaping Use: Never used  Substance and Sexual Activity   Alcohol use: Yes    Comment: rare   Drug use: No   Sexual activity: Not on file  Other Topics Concern   Not on file  Social History Narrative   Not on file   Social Determinants of Health   Financial Resource Strain: Not on file  Food Insecurity: No Food Insecurity (02/12/2022)   Hunger Vital Sign    [name]: Never true    [name]: Never true  Transportation Needs: No Transportation Needs (02/12/2022)   PRAPARE - Hydrologist (Medical): No    Lack of Transportation (Non-Medical): No  Physical Activity: Not on file  Stress: Not on file  Social Connections: Not on file    Review of Systems: A 12 point ROS discussed and pertinent positives are indicated in the HPI above.  All other systems are negative.  Review of  Systems  Vital Signs: BP (!) 140/67   Pulse 88   Temp 98.1 F (36.7 C) (Oral)   Resp 18   Ht 6' (1.829 m)   Wt 300 lb (136.1 kg)   SpO2 97%   BMI 40.69 kg/m   Physical Exam Constitutional:      General: He is not in acute distress. HENT:     Head: Normocephalic and atraumatic.  Cardiovascular:     Rate and Rhythm: Normal rate and regular rhythm.  Pulmonary:     Effort: Pulmonary effort is normal. No respiratory distress.     Breath sounds: No wheezing.  Abdominal:     Palpations: Abdomen is soft.  Skin:    General: Skin is warm and dry.  Neurological:     Mental Status: He is alert and oriented to person, place, and time.    Imaging: US RENAL  Result Date: 01/30/2022 CLINICAL DATA:  CKD stage 2 EXAM: RENAL / URINARY TRACT ULTRASOUND COMPLETE COMPARISON:  April 07, 2011 FINDINGS: Right Kidney: Renal measurements: 11.5 x 6.2 x 6.3 cm = volume: 232 mL. Echogenicity within normal limits. No mass or hydronephrosis visualized. Left Kidney: Renal measurements: 13.2 x 6.5 x 5.6 cm = volume: 251 mL. Echogenicity within normal limits. There are likely 2 benign peripelvic cysts. There is prominence of the renal pelvis without caliectasis identified. Bladder: Trabeculated in appearance. Other: LEFT-sided pleural effusion. IMPRESSION: 1. There is prominence of the LEFT renal pelvis without frank hydronephrosis identified. If concern for obstruction, recommend dedicated CT scan without contrast. 2. LEFT-sided pleural effusion. 3. Trabeculated appearance of the bladder which may reflect sequela of chronic outlet obstruction. Electronically Signed   By: Valentino Saxon M.D.   On: 01/30/2022 18:02    Labs:  CBC: Recent Labs    07/14/21 1312 09/25/21 1116 11/25/21 1253 02/19/22 0814  WBC 6.5 6.1 7.6 6.5  HGB 10.8* 9.3* 8.8* 8.9*  HCT 32.3* 28.9* 26.5* 28.5*  PLT 186 191 204 211    COAGS: Recent Labs    02/19/22 0814  INR 1.1    BMP: Recent Labs    02/19/21 0919  06/02/21 1344 11/25/21 1253  NA 136 138 137  K 3.8 3.8 4.4  CL 100 103 108  CO2 '25 29 27  '$ GLUCOSE 415* 175* 268*  BUN 11 18 28*  CALCIUM 8.4* 8.7* 8.5*  CREATININE 0.91 1.01 1.15  GFRNONAA >60 >60 >60    LIVER FUNCTION TESTS: Recent  Labs    02/19/21 0919 06/02/21 1344  BILITOT 1.6* 1.3*  AST 15 18  ALT 13 20  ALKPHOS 93 94  PROT 6.2* 7.1  ALBUMIN 3.1* 3.5    Assessment and Plan: This is a 71 year old male with PMHx of diabetes, HTN, CAD, CKD with history of AKI in 2013 requiring HD treatments. Patient follows with nephrology now with proteinuria in nephrotic range and concern for membraneous nephropathy.   Request received for renal biopsy.   The patient has been NPO, no blood thinners taken, labs and vitals have been reviewed.  Risks and benefits of image guided renal biopsy was discussed with the patient and/or patient's family including, but not limited to bleeding, infection, damage to adjacent structures or low yield requiring additional tests.  All of the questions were answered and there is agreement to proceed.  Consent signed and in chart.    Thank you for this interesting consult.  I greatly enjoyed meeting ADEM COSTLOW and look forward to participating in their care.  A copy of this report was sent to the requesting provider on this date.  Electronically Signed: Hedy Jacob, PA-C 02/19/2022, 9:04 AM   I spent a total of  15 Minutes in face to face in clinical consultation, greater than 50% of which was counseling/coordinating care for renal biopsy.

## 2022-02-19 ENCOUNTER — Other Ambulatory Visit: Payer: Self-pay

## 2022-02-19 ENCOUNTER — Ambulatory Visit
Admission: RE | Admit: 2022-02-19 | Discharge: 2022-02-19 | Disposition: A | Discharge status: Home / Self Care | Payer: Medicare HMO | Source: Ambulatory Visit | Attending: Nephrology | Admitting: Nephrology

## 2022-02-19 ENCOUNTER — Other Ambulatory Visit: Payer: Self-pay | Admitting: Nephrology

## 2022-02-19 DIAGNOSIS — I2510 Atherosclerotic heart disease of native coronary artery without angina pectoris: Secondary | ICD-10-CM | POA: Diagnosis not present

## 2022-02-19 DIAGNOSIS — E1122 Type 2 diabetes mellitus with diabetic chronic kidney disease: Secondary | ICD-10-CM | POA: Insufficient documentation

## 2022-02-19 DIAGNOSIS — E119 Type 2 diabetes mellitus without complications: Secondary | ICD-10-CM | POA: Diagnosis not present

## 2022-02-19 DIAGNOSIS — I129 Hypertensive chronic kidney disease with stage 1 through stage 4 chronic kidney disease, or unspecified chronic kidney disease: Secondary | ICD-10-CM | POA: Diagnosis not present

## 2022-02-19 DIAGNOSIS — N189 Chronic kidney disease, unspecified: Secondary | ICD-10-CM | POA: Diagnosis not present

## 2022-02-19 DIAGNOSIS — I10 Essential (primary) hypertension: Secondary | ICD-10-CM | POA: Insufficient documentation

## 2022-02-19 DIAGNOSIS — R809 Proteinuria, unspecified: Secondary | ICD-10-CM | POA: Diagnosis not present

## 2022-02-19 LAB — CBC WITH DIFFERENTIAL/PLATELET
Abs Immature Granulocytes: 0.02 10*3/uL (ref 0.00–0.07)
Basophils Absolute: 0 10*3/uL (ref 0.0–0.1)
Basophils Relative: 1 %
Eosinophils Absolute: 0.1 10*3/uL (ref 0.0–0.5)
Eosinophils Relative: 2 %
HCT: 28.5 % — ABNORMAL LOW (ref 39.0–52.0)
Hemoglobin: 8.9 g/dL — ABNORMAL LOW (ref 13.0–17.0)
Immature Granulocytes: 0 %
Lymphocytes Relative: 11 %
Lymphs Abs: 0.7 10*3/uL (ref 0.7–4.0)
MCH: 27.3 pg (ref 26.0–34.0)
MCHC: 31.2 g/dL (ref 30.0–36.0)
MCV: 87.4 fL (ref 80.0–100.0)
Monocytes Absolute: 0.4 10*3/uL (ref 0.1–1.0)
Monocytes Relative: 7 %
Neutro Abs: 5.1 10*3/uL (ref 1.7–7.7)
Neutrophils Relative %: 79 %
Platelets: 211 10*3/uL (ref 150–400)
RBC: 3.26 MIL/uL — ABNORMAL LOW (ref 4.22–5.81)
RDW: 14.9 % (ref 11.5–15.5)
WBC: 6.5 10*3/uL (ref 4.0–10.5)
nRBC: 0 % (ref 0.0–0.2)

## 2022-02-19 LAB — BASIC METABOLIC PANEL
Anion gap: 3 — ABNORMAL LOW (ref 5–15)
BUN: 30 mg/dL — ABNORMAL HIGH (ref 8–23)
CO2: 26 mmol/L (ref 22–32)
Calcium: 8.1 mg/dL — ABNORMAL LOW (ref 8.9–10.3)
Chloride: 109 mmol/L (ref 98–111)
Creatinine, Ser: 1.19 mg/dL (ref 0.61–1.24)
GFR, Estimated: >60 mL/min (ref >60)
Glucose, Bld: 155 mg/dL — ABNORMAL HIGH (ref 70–99)
Potassium: 4.5 mmol/L (ref 3.5–5.1)
Sodium: 138 mmol/L (ref 135–145)

## 2022-02-19 LAB — GLUCOSE, CAPILLARY: Glucose-Capillary: 130 mg/dL — ABNORMAL HIGH (ref 70–99)

## 2022-02-19 LAB — PROTIME-INR
INR: 1.1 (ref 0.8–1.2)
Prothrombin Time: 14.1 seconds (ref 11.4–15.2)

## 2022-02-19 MED ORDER — LIDOCAINE HCL (PF) 1 % IJ SOLN
INTRAMUSCULAR | Status: AC | PRN
  Administered 2022-02-19: 10 mL

## 2022-02-19 MED ORDER — MIDAZOLAM HCL 2 MG/2ML IJ SOLN
INTRAMUSCULAR | Status: AC
  Filled 2022-02-19: qty 4

## 2022-02-19 MED ORDER — FENTANYL CITRATE (PF) 100 MCG/2ML IJ SOLN
INTRAMUSCULAR | Status: AC
  Filled 2022-02-19: qty 2

## 2022-02-19 MED ORDER — SODIUM CHLORIDE 0.9 % IV SOLN: INTRAVENOUS | Status: DC

## 2022-02-19 MED ORDER — FENTANYL CITRATE (PF) 100 MCG/2ML IJ SOLN
INTRAMUSCULAR | Status: AC | PRN
  Administered 2022-02-19: 50 ug via INTRAVENOUS

## 2022-02-19 MED ORDER — MIDAZOLAM HCL 2 MG/2ML IJ SOLN
INTRAMUSCULAR | Status: AC | PRN
  Administered 2022-02-19: 1 mg via INTRAVENOUS

## 2022-02-19 NOTE — Sedation Documentation (Signed)
Dr. Annamaria Boots made aware of newly increased BP greater than 500 systolic

## 2022-02-19 NOTE — Procedures (Signed)
Interventional Radiology Procedure Note  Procedure: CT LEFT RENAL BX    Complications: None  Estimated Blood Loss:  TRACE  Findings: 18G INTACT CORE    Tamera Punt, MD

## 2022-02-23 ENCOUNTER — Encounter: Payer: Medicare HMO | Admitting: *Deleted

## 2022-02-23 DIAGNOSIS — I5022 Chronic systolic (congestive) heart failure: Secondary | ICD-10-CM

## 2022-02-23 NOTE — Progress Notes (Signed)
Daily Session Note  Patient Details  Name: [name] MRN: [id number] Date of Birth: 07/13/1950 Referring Provider:   Flowsheet Row Pulmonary Rehab from 02/02/2022 in [facility] Cardiac and Pulmonary Rehab  Referring Provider [name]       Encounter Date: 02/23/2022  Check In:  Session Check In - 02/23/22 0934       Check-In   Supervising physician immediately available to respond to emergencies See telemetry face sheet for immediately available ER MD    Location [facility]-Cardiac & Pulmonary Rehab    Staff Present Darlyne Russian, RN, Doyce Para, BS, ACSM CEP, Exercise Physiologist;Noah Tickle, BS, Exercise Physiologist    Virtual Visit No    Medication changes reported     No    Fall or balance concerns reported    No    Warm-up and Cool-down Performed on first and last piece of equipment    Resistance Training Performed Yes    VAD Patient? No    PAD/SET Patient? No      Pain Assessment   Currently in Pain? No/denies                Social History   Tobacco Use  Smoking Status Never  Smokeless Tobacco Never    Goals Met:  Independence with exercise equipment Exercise tolerated well No report of concerns or symptoms today Strength training completed today  Goals Unmet:  Not Applicable  Comments: Pt able to follow exercise prescription today without complaint.  Will continue to monitor for progression.    Dr. Emily Filbert is Medical Director for [place].  Dr. Ottie Glazier is Medical Director for [facility] Pulmonary Rehabilitation.

## 2022-02-24 DIAGNOSIS — R8290 Unspecified abnormal findings in urine: Secondary | ICD-10-CM | POA: Diagnosis not present

## 2022-02-24 DIAGNOSIS — Z794 Long term (current) use of insulin: Secondary | ICD-10-CM | POA: Diagnosis not present

## 2022-02-24 DIAGNOSIS — R809 Proteinuria, unspecified: Secondary | ICD-10-CM | POA: Diagnosis not present

## 2022-02-24 DIAGNOSIS — E1122 Type 2 diabetes mellitus with diabetic chronic kidney disease: Secondary | ICD-10-CM | POA: Diagnosis not present

## 2022-02-24 DIAGNOSIS — E1129 Type 2 diabetes mellitus with other diabetic kidney complication: Secondary | ICD-10-CM | POA: Diagnosis not present

## 2022-02-24 DIAGNOSIS — N189 Chronic kidney disease, unspecified: Secondary | ICD-10-CM | POA: Diagnosis not present

## 2022-02-24 DIAGNOSIS — E1159 Type 2 diabetes mellitus with other circulatory complications: Secondary | ICD-10-CM | POA: Diagnosis not present

## 2022-02-24 DIAGNOSIS — N182 Chronic kidney disease, stage 2 (mild): Secondary | ICD-10-CM | POA: Diagnosis not present

## 2022-02-24 DIAGNOSIS — R600 Localized edema: Secondary | ICD-10-CM | POA: Diagnosis not present

## 2022-02-24 DIAGNOSIS — N079 Hereditary nephropathy, not elsewhere classified with unspecified morphologic lesions: Secondary | ICD-10-CM | POA: Diagnosis not present

## 2022-02-24 DIAGNOSIS — E113293 Type 2 diabetes mellitus with mild nonproliferative diabetic retinopathy without macular edema, bilateral: Secondary | ICD-10-CM | POA: Diagnosis not present

## 2022-02-24 DIAGNOSIS — R3129 Other microscopic hematuria: Secondary | ICD-10-CM | POA: Diagnosis not present

## 2022-02-24 DIAGNOSIS — E1169 Type 2 diabetes mellitus with other specified complication: Secondary | ICD-10-CM | POA: Diagnosis not present

## 2022-02-24 DIAGNOSIS — I10 Essential (primary) hypertension: Secondary | ICD-10-CM | POA: Diagnosis not present

## 2022-02-24 DIAGNOSIS — E669 Obesity, unspecified: Secondary | ICD-10-CM | POA: Diagnosis not present

## 2022-02-25 ENCOUNTER — Encounter: Payer: Medicare HMO | Admitting: *Deleted

## 2022-02-25 DIAGNOSIS — I5022 Chronic systolic (congestive) heart failure: Secondary | ICD-10-CM

## 2022-02-25 NOTE — Progress Notes (Signed)
Daily Session Note  Patient Details  Name: [name] MRN: [id number] Date of Birth: 17-Nov-1950 Referring Provider:   April Manson Pulmonary Rehab from 02/02/2022 in [facility] Cardiac and Pulmonary Rehab  Referring Provider [name]       Encounter Date: 02/25/2022  Check In:  Session Check In - 02/25/22 0928       Check-In   Supervising physician immediately available to respond to emergencies See telemetry face sheet for immediately available ER MD    Location [facility]-Cardiac & Pulmonary Rehab    Staff Present Darlyne Russian, RN, Viviana Simpler, RDN, Melody Haver, BS, Exercise Physiologist;[employer], [place]    Virtual Visit No    Medication changes reported     No    Fall or balance concerns reported    No    Warm-up and Cool-down Performed on first and last piece of equipment    Resistance Training Performed Yes    VAD Patient? No    PAD/SET Patient? No      Pain Assessment   Currently in Pain? No/denies                Social History   Tobacco Use  Smoking Status Never  Smokeless Tobacco Never    Goals Met:  Independence with exercise equipment Exercise tolerated well No report of concerns or symptoms today Strength training completed today  Goals Unmet:  Not Applicable  Comments: Pt able to follow exercise prescription today without complaint.  Will continue to monitor for progression.    Dr. Emily Filbert is Medical Director for [place].  Dr. Ottie Glazier is Medical Director for [facility] Pulmonary Rehabilitation.

## 2022-02-26 DIAGNOSIS — R600 Localized edema: Secondary | ICD-10-CM | POA: Diagnosis not present

## 2022-02-26 DIAGNOSIS — E1122 Type 2 diabetes mellitus with diabetic chronic kidney disease: Secondary | ICD-10-CM | POA: Diagnosis not present

## 2022-02-26 DIAGNOSIS — R809 Proteinuria, unspecified: Secondary | ICD-10-CM | POA: Diagnosis not present

## 2022-02-26 DIAGNOSIS — N189 Chronic kidney disease, unspecified: Secondary | ICD-10-CM | POA: Diagnosis not present

## 2022-02-26 DIAGNOSIS — I10 Essential (primary) hypertension: Secondary | ICD-10-CM | POA: Diagnosis not present

## 2022-02-26 DIAGNOSIS — N2581 Secondary hyperparathyroidism of renal origin: Secondary | ICD-10-CM | POA: Diagnosis not present

## 2022-02-26 DIAGNOSIS — N0421 Primary membranous nephropathy with nephrotic syndrome: Secondary | ICD-10-CM | POA: Diagnosis not present

## 2022-02-27 ENCOUNTER — Encounter: Payer: Medicare HMO | Admitting: *Deleted

## 2022-02-27 DIAGNOSIS — I5022 Chronic systolic (congestive) heart failure: Secondary | ICD-10-CM

## 2022-02-27 NOTE — Progress Notes (Signed)
Daily Session Note  Patient Details  Name: [name] MRN: [id number] Date of Birth: 1950-05-01 Referring Provider:   April Booth Pulmonary Rehab from 02/02/2022 in [facility] Cardiac and Pulmonary Rehab  Referring Provider [name]       Encounter Date: 02/27/2022  Check In:  Session Check In - 02/27/22 1014       Check-In   Supervising physician immediately available to respond to emergencies See telemetry face sheet for immediately available ER MD    Location [facility]-Cardiac & Pulmonary Rehab    Staff Present Andrew Lark, RN, BSN, CCRP;Andrew Fritch, MA, RCEP, CCRP, CCET;[name], [place]    Virtual Visit No    Medication changes reported     No    Fall or balance concerns reported    No    Warm-up and Cool-down Performed on first and last piece of equipment    Resistance Training Performed Yes    VAD Patient? No    PAD/SET Patient? No      Pain Assessment   Currently in Pain? No/denies                Social History   Tobacco Use  Smoking Status Never  Smokeless Tobacco Never    Goals Met:  Proper associated with RPD/PD & O2 Sat Independence with exercise equipment Exercise tolerated well No report of concerns or symptoms today  Goals Unmet:  Not Applicable  Comments: Pt able to follow exercise prescription today without complaint.  Will continue to monitor for progression.    Dr. Emily Booth is Medical Director for [place].  Dr. Ottie Booth is Medical Director for [facility] Pulmonary Rehabilitation.

## 2022-03-01 DIAGNOSIS — I83003 Varicose veins of unspecified lower extremity with ulcer of ankle: Secondary | ICD-10-CM | POA: Insufficient documentation

## 2022-03-01 NOTE — Progress Notes (Unsigned)
MRN : [id number]  [name] is a 71 y.o. (10-Oct-1950) male who presents with chief complaint of legs swell.  History of Present Illness:   Patient is seen for evaluation of leg pain and leg swelling. The patient first noticed the swelling remotely. The swelling is associated with pain and discoloration. The pain and swelling worsens with prolonged dependency and improves with elevation. The pain is unrelated to activity.  The patient notes that in the morning the legs are significantly improved but they steadily worsened throughout the course of the day. The patient also notes a steady worsening of the discoloration in the ankle and shin area.   The patient denies claudication symptoms.  The patient denies symptoms consistent with rest pain.  The patient denies and extensive history of DJD and LS spine disease.  The patient has no had any past angiography, interventions or vascular surgery.  Elevation makes the leg symptoms better, dependency makes them much worse. There is no history of ulcerations. The patient denies any recent changes in medications.  The patient has not been wearing graduated compression.  The patient denies a history of DVT or PE. There is no prior history of phlebitis. There is no history of primary lymphedema.  No history of malignancies. No history of trauma or groin or pelvic surgery. There is no history of radiation treatment to the groin or pelvis  The patient denies amaurosis fugax or recent TIA symptoms. There are no recent neurological changes noted. The patient denies recent episodes of angina or shortness of breath  No outpatient medications have been marked as taking for the 03/02/22 encounter (Appointment) with Delana Meyer, Dolores Lory, MD.    Past Medical History:  Diagnosis Date   Arthritis    CHF (congestive heart failure), NYHA class II, acute on chronic, systolic (HCC)    EF 40% 11/8117   Coronary artery disease    [facility]   Diabetes mellitus without complication ([place])    Dysplastic nevus 08/26/2020   L flank, moderate atypia   Hyperlipidemia    Hypertension    Melanoma ([place]) 08/20/2015   Right neck. Superficial spreading, arising in nevus. Tumor thickness 1.30m, Anatomic level IV   [place] in situ [place] 05/22/2020   R lateral neck ant to scar, exc 05/22/20   Numbness in right leg    due to back   Skin cancer    removed l arm   Spinal stenosis    Stented coronary artery 2000   X 1 STENT   Swelling of both lower extremities     Past Surgical History:  Procedure Laterality Date   CATARACT EXTRACTION W/PHACO Right 05/03/2019   Procedure: CATARACT EXTRACTION PHACO AND INTRAOCULAR LENS PLACEMENT (IOC) RIGHT DIABETIC 6.44 00:58.4 11.0%;  Surgeon: BLeandrew Koyanagi MD;  [facility]  Service: Ophthalmology;  Laterality: Right;  DIABETIC   CATARACT EXTRACTION W/PHACO Left 06/28/2019   Procedure: CATARACT EXTRACTION PHACO AND INTRAOCULAR LENS PLACEMENT (IOC) LEFT DIABETIC 7.99  01:13.4  10.9% ;  Surgeon: BLeandrew Koyanagi MD;  [facility]  Service: Ophthalmology;  Laterality: Left;  Diabetic - insulin and oral meds   CHOLECYSTECTOMY     JOINT REPLACEMENT     RT KNEE   LUMBAR LAMINECTOMY/DECOMPRESSION MICRODISCECTOMY  04/13/2012   Procedure: LUMBAR LAMINECTOMY/DECOMPRESSION MICRODISCECTOMY 1 LEVEL;  Surgeon: JJohnn Hai MD;  Location: [facility];  Service: Orthopedics;  Laterality: Bilateral;  L4-L5   UVULECTOMY  1990's  Social History Social History   Tobacco Use   Smoking status: Never   Smokeless tobacco: Never  Vaping Use   Vaping Use: Never used  Substance Use Topics   Alcohol use: Yes    Comment: rare   Drug use: No    Family History No family history on file.  Allergies  Allergen Reactions   Metoprolol Other (See Comments)    Cant remember  Cant remember  Cant remember  Cant remember   Toprol Xl [Metoprolol Tartrate] Other  (See Comments)    Cant remember   Buprenorphine Itching and Rash   Buprenorphine Hcl Itching and Rash   Morphine Itching and Other (See Comments)   Morphine And Related Itching, Rash and Other (See Comments)   Triamcinolone Rash   Triamcinolone Acetonide Rash     REVIEW OF SYSTEMS (Negative unless checked)  Constitutional: '[]'$ Weight loss  '[]'$ Fever  '[]'$ Chills Cardiac: '[]'$ Chest pain   '[]'$ Chest pressure   '[]'$ Palpitations   '[]'$ Shortness of breath when laying flat   '[]'$ Shortness of breath with exertion. Vascular:  '[]'$ Pain in legs with walking   '[x]'$ Pain in legs with standing  '[]'$ History of DVT   '[]'$ Phlebitis   '[x]'$ Swelling in legs   '[]'$ Varicose veins   '[]'$ Non-healing ulcers Pulmonary:   '[]'$ Uses home oxygen   '[]'$ Productive cough   '[]'$ Hemoptysis   '[]'$ Wheeze  '[]'$ COPD   '[]'$ Asthma Neurologic:  '[]'$ Dizziness   '[]'$ Seizures   '[]'$ History of stroke   '[]'$ History of TIA  '[]'$ Aphasia   '[]'$ Vissual changes   '[]'$ Weakness or numbness in arm   '[]'$ Weakness or numbness in leg Musculoskeletal:   '[]'$ Joint swelling   '[]'$ Joint pain   '[]'$ Low back pain Hematologic:  '[]'$ Easy bruising  '[]'$ Easy bleeding   '[]'$ Hypercoagulable state   '[]'$ Anemic Gastrointestinal:  '[]'$ Diarrhea   '[]'$ Vomiting  '[]'$ Gastroesophageal reflux/heartburn   '[]'$ Difficulty swallowing. Genitourinary:  '[]'$ Chronic kidney disease   '[]'$ Difficult urination  '[]'$ Frequent urination   '[]'$ Blood in urine Skin:  '[]'$ Rashes   '[]'$ Ulcers  Psychological:  '[]'$ History of anxiety   '[]'$  History of major depression.  Physical Examination  There were no vitals filed for this visit. There is no height or weight on file to calculate BMI. Gen: WD/WN, NAD Head: [place]/AT, No temporalis wasting.  Ear/Nose/Throat: Hearing grossly intact, nares w/o erythema or drainage, pinna without lesions Eyes: PER, EOMI, sclera nonicteric.  Neck: Supple, no gross masses.  No JVD.  Pulmonary:  Good air movement, no audible wheezing, no use of accessory muscles.  Cardiac: RRR, precordium not hyperdynamic. Vascular:  scattered varicosities present  bilaterally.  Mild venous stasis changes to the legs bilaterally.  3-4+ soft pitting edema, CEAP C4sEpAsPr  Vessel Right Left  Radial Palpable Palpable  Gastrointestinal: soft, non-distended. No guarding/no peritoneal signs.  Musculoskeletal: M/S 5/5 throughout.  No deformity.  Neurologic: CN 2-12 intact. Pain and light touch intact in extremities.  Symmetrical.  Speech is fluent. Motor exam as listed above. Psychiatric: Judgment intact, Mood & affect appropriate for pt's clinical situation. Dermatologic: Venous rashes no ulcers noted.  No changes consistent with cellulitis. Lymph : No lichenification or skin changes of chronic lymphedema.  CBC Lab Results  Component Value Date   WBC 6.5 02/19/2022   HGB 8.9 (L) 02/19/2022   HCT 28.5 (L) 02/19/2022   MCV 87.4 02/19/2022   PLT 211 02/19/2022    BMET    Component Value Date/Time   NA 138 02/19/2022 0814   NA 142 04/15/2011 0417   K 4.5 02/19/2022 0814   K 4.0 04/15/2011 0417  CL 109 02/19/2022 0814   CL 98 04/15/2011 0417   CO2 26 02/19/2022 0814   CO2 33 (H) 04/15/2011 0417   GLUCOSE 155 (H) 02/19/2022 0814   GLUCOSE 111 (H) 04/15/2011 0417   BUN 30 (H) 02/19/2022 0814   BUN 46 (H) 04/15/2011 0417   CREATININE 1.19 02/19/2022 0814   CREATININE 5.00 (H) 04/15/2011 0417   CALCIUM 8.1 (L) 02/19/2022 0814   CALCIUM 7.6 (L) 04/15/2011 0417   GFRNONAA >60 02/19/2022 0814   GFRNONAA 13 (L) 04/15/2011 0417   GFRAA >60 10/15/2019 1035   GFRAA 15 (L) 04/15/2011 0417   Estimated Creatinine Clearance: 79 mL/min (by C-G formula based on SCr of 1.19 mg/dL).  COAG Lab Results  Component Value Date   INR 1.1 02/19/2022   INR 1.0 10/15/2019   INR 1.0 05/16/2019    Radiology CT RENAL BIOPSY  Result Date: 02/19/2022 INDICATION: Proteinuria, diabetes, hypertension EXAM: CT CORE BIOPSY LEFT RENAL CORTEX TECHNIQUE: Multidetector CT imaging of the MID ABDOMEN was performed following the standard protocol WITHOUT IV contrast.  RADIATION DOSE REDUCTION: This exam was performed according to the departmental dose-optimization program which includes automated exposure control, adjustment of the mA and/or kV according to patient size and/or use of iterative reconstruction technique. MEDICATIONS: 1% lidocaine local ANESTHESIA/SEDATION: Moderate (conscious) sedation was employed during this procedure. A total of Versed 1.0 mg and Fentanyl 50 mcg was administered intravenously by the radiology nurse. Total intra-service moderate Sedation Time: 12 minutes. The patient's level of consciousness and vital signs were monitored continuously by radiology nursing throughout the procedure under my direct supervision. COMPLICATIONS: None immediate. PROCEDURE: Informed written consent was obtained from the patient after a thorough discussion of the procedural risks, benefits and alternatives. All questions were addressed. Maximal Sterile Barrier Technique was utilized including caps, mask, sterile gowns, sterile gloves, sterile drape, hand hygiene and skin antiseptic. A timeout was performed prior to the initiation of the procedure. Previous imaging reviewed. Patient position prone. Noncontrast localization CT performed. The right kidney lower pole posterior cortex was marked for biopsy. Under sterile conditions and local anesthesia, CT guidance utilized to advance the 17 gauge 16.8 cm guide needle to the posterior lower pole cortex. Needle position confirmed with CT on the renal cortex. Through the access, a single 2 cm 18 gauge core biopsy was obtained. Sample was intact and non fragmented. This was placed on a saline moistened Telfa. Needle tract occluded with Gel-Foam. Postprocedure imaging demonstrates no hemorrhage or hematoma. Patient tolerated biopsy well. IMPRESSION: Successful CT-guided 18 gauge core biopsy of the left kidney. Electronically Signed   By: Jerilynn Mages.  Shick M.D.   On: 02/19/2022 11:06   US RENAL  Result Date: 01/30/2022 CLINICAL DATA:   CKD stage 2 EXAM: RENAL / URINARY TRACT ULTRASOUND COMPLETE COMPARISON:  April 07, 2011 FINDINGS: Right Kidney: Renal measurements: 11.5 x 6.2 x 6.3 cm = volume: 232 mL. Echogenicity within normal limits. No mass or hydronephrosis visualized. Left Kidney: Renal measurements: 13.2 x 6.5 x 5.6 cm = volume: 251 mL. Echogenicity within normal limits. There are likely 2 benign peripelvic cysts. There is prominence of the renal pelvis without caliectasis identified. Bladder: Trabeculated in appearance. Other: LEFT-sided pleural effusion. IMPRESSION: 1. There is prominence of the LEFT renal pelvis without frank hydronephrosis identified. If concern for obstruction, recommend dedicated CT scan without contrast. 2. LEFT-sided pleural effusion. 3. Trabeculated appearance of the bladder which may reflect sequela of chronic outlet obstruction. Electronically Signed   By: Valentino Saxon M.D.  On: 01/30/2022 18:02     Assessment/Plan There are no diagnoses linked to this encounter.   Hortencia Pilar, MD  03/01/2022 2:44 PM

## 2022-03-02 ENCOUNTER — Ambulatory Visit (INDEPENDENT_AMBULATORY_CARE_PROVIDER_SITE_OTHER): Payer: Medicare HMO | Admitting: Vascular Surgery

## 2022-03-02 ENCOUNTER — Encounter: Payer: Medicare HMO | Admitting: *Deleted

## 2022-03-02 ENCOUNTER — Encounter (INDEPENDENT_AMBULATORY_CARE_PROVIDER_SITE_OTHER): Payer: Self-pay | Admitting: Vascular Surgery

## 2022-03-02 VITALS — BP 117/60 | HR 109 | Resp 18 | Ht 72.0 in | Wt 305.0 lb

## 2022-03-02 DIAGNOSIS — E1169 Type 2 diabetes mellitus with other specified complication: Secondary | ICD-10-CM

## 2022-03-02 DIAGNOSIS — M79669 Pain in unspecified lower leg: Secondary | ICD-10-CM

## 2022-03-02 DIAGNOSIS — I890 Lymphedema, not elsewhere classified: Secondary | ICD-10-CM

## 2022-03-02 DIAGNOSIS — E669 Obesity, unspecified: Secondary | ICD-10-CM

## 2022-03-02 DIAGNOSIS — I5022 Chronic systolic (congestive) heart failure: Secondary | ICD-10-CM | POA: Insufficient documentation

## 2022-03-02 DIAGNOSIS — I700 Atherosclerosis of aorta: Secondary | ICD-10-CM | POA: Insufficient documentation

## 2022-03-02 DIAGNOSIS — I10 Essential (primary) hypertension: Secondary | ICD-10-CM | POA: Diagnosis not present

## 2022-03-02 DIAGNOSIS — I25119 Atherosclerotic heart disease of native coronary artery with unspecified angina pectoris: Secondary | ICD-10-CM | POA: Diagnosis not present

## 2022-03-02 DIAGNOSIS — I5023 Acute on chronic systolic (congestive) heart failure: Secondary | ICD-10-CM | POA: Insufficient documentation

## 2022-03-02 DIAGNOSIS — M7989 Other specified soft tissue disorders: Secondary | ICD-10-CM

## 2022-03-02 DIAGNOSIS — I6523 Occlusion and stenosis of bilateral carotid arteries: Secondary | ICD-10-CM

## 2022-03-02 DIAGNOSIS — I83003 Varicose veins of unspecified lower extremity with ulcer of ankle: Secondary | ICD-10-CM

## 2022-03-11 ENCOUNTER — Encounter: Payer: Medicare HMO | Admitting: *Deleted

## 2022-03-11 ENCOUNTER — Encounter: Payer: Self-pay | Admitting: *Deleted

## 2022-03-11 DIAGNOSIS — I5022 Chronic systolic (congestive) heart failure: Secondary | ICD-10-CM

## 2022-03-11 NOTE — Progress Notes (Signed)
Pulmonary Individual Treatment Plan  Patient Details  Name: [name] MRN: [id number] Date of Birth: [birth year] Referring Provider:   Flowsheet Row Pulmonary Rehab from 02/02/2022 in [facility] Cardiac and Pulmonary Rehab  Referring Provider [name]       Initial Encounter Date:  Flowsheet Row Pulmonary Rehab from 02/02/2022 in [facility] Cardiac and Pulmonary Rehab  Date 02/02/22       Visit Diagnosis: Heart failure, chronic systolic ([place])  Patient's Home Medications on Admission:  Current Outpatient Medications:    amLODipine (NORVASC) 5 MG tablet, Take 5 mg by mouth daily., Disp: , Rfl:    aspirin 325 MG tablet, Take 1 tablet (325 mg total) by mouth daily. (Patient not taking: Reported on 01/26/2022), Disp: , Rfl:    aspirin EC 81 MG tablet, Take by mouth., Disp: , Rfl:    atorvastatin (LIPITOR) 80 MG tablet, Take 80 mg by mouth daily., Disp: , Rfl:    carvedilol (COREG) 6.25 MG tablet, Take 6.25 mg by mouth 2 (two) times daily. (Patient not taking: Reported on 11/25/2021), Disp: , Rfl:    Continuous Blood Gluc Receiver (Hardy) DEVI, Use to monitor blood sugar. [place] [phone], Disp: , Rfl:    Continuous Blood Gluc Sensor (DEXCOM G6 SENSOR) MISC, Use to monitor blood sugar.  Replace every 10 days. [place] [id number]., Disp: , Rfl:    Continuous Blood Gluc Transmit (DEXCOM G6 TRANSMITTER) MISC, Use to monitor blood sugar.  Replace every 3 months. [place] [id number]., Disp: , Rfl:    FEROSUL 325 (65 Fe) MG tablet, Take 1 tablet (325 mg total) by mouth 2 (two) times daily. (Patient not taking: Reported on 01/26/2022), Disp: 180 tablet, Rfl: 1   ferrous sulfate (FEROSUL) 325 (65 FE) MG tablet, Take by mouth., Disp: , Rfl:    furosemide (LASIX) 20 MG tablet, Take by mouth. (Patient not taking: Reported on 01/26/2022), Disp: , Rfl:    furosemide (LASIX) 40 MG tablet, , Disp: , Rfl:    furosemide (LASIX) 80 MG tablet, Take by mouth. (Patient not taking: Reported on 01/26/2022),  Disp: , Rfl:    glipiZIDE (GLUCOTROL XL) 5 MG 24 hr tablet, , Disp: , Rfl:    insulin aspart (NOVOLOG) 100 UNIT/ML injection, Inject into the skin 3 (three) times daily before meals., Disp: , Rfl:    insulin glargine (LANTUS) 100 UNIT/ML injection, Inject 44 Units into the skin daily., Disp: , Rfl:    Insulin Pen Needle (COMFORT EZ PEN NEEDLES) 32G X 8 MM MISC, See admin instructions., Disp: , Rfl:    isosorbide mononitrate (IMDUR) 30 MG 24 hr tablet, Take by mouth., Disp: , Rfl:    JARDIANCE 10 MG TABS tablet, , Disp: , Rfl:    LANTUS SOLOSTAR 100 UNIT/ML Solostar Pen, SMARTSIG:44 Unit(s) SUB-Q Every Night, Disp: , Rfl:    losartan (COZAAR) 100 MG tablet, , Disp: , Rfl:    metFORMIN (GLUCOPHAGE) 1000 MG tablet, , Disp: , Rfl:    metFORMIN (GLUCOPHAGE-XR) 500 MG 24 hr tablet, Take by mouth. (Patient not taking: Reported on 01/26/2022), Disp: , Rfl:    NOVOLOG FLEXPEN 100 UNIT/ML FlexPen, INJ UP TO 50 UNI D [place] IN MULTIPLE INJECTIONS B MEALS UTD, Disp: , Rfl:    predniSONE (DELTASONE) 10 MG tablet, take 1 tab three times a day for 2 days, 1 tab twice a day for 5 days, 1 tab daily till finished (Patient not taking: Reported on 01/26/2022), Disp: , Rfl:    spironolactone (ALDACTONE) 25 MG tablet, ,  Disp: , Rfl:    urea (CARMOL) 10 % cream, Apply topically. (Patient not taking: Reported on 02/19/2022), Disp: , Rfl:    valsartan (DIOVAN) 160 MG tablet, Take by mouth. (Patient not taking: Reported on 01/26/2022), Disp: , Rfl:    valsartan (DIOVAN) 320 MG tablet, , Disp: , Rfl:   Past Medical History: Past Medical History:  Diagnosis Date   Arthritis    CHF (congestive heart failure), NYHA class II, acute on chronic, systolic (HCC)    EF 78% 04/4233   Coronary artery disease    [facility]   Diabetes mellitus without complication ([place])    Dysplastic nevus 08/26/2020   L flank, moderate atypia   Hyperlipidemia    Hypertension    Melanoma ([place]) 08/20/2015   Right neck.  Superficial spreading, arising in nevus. Tumor thickness 1.37m, Anatomic level IV   Melanoma in situ ([place] 05/22/2020   R lateral neck ant to scar, exc 05/22/20   Numbness in right leg    due to back   Skin cancer    removed l arm   Spinal stenosis    Stented coronary artery 2000   X 1 STENT   Swelling of both lower extremities     Tobacco Use: Social History   Tobacco Use  Smoking Status Never  Smokeless Tobacco Never    Labs: Review Flowsheet       Latest Ref Rng & Units 09/01/2006 03/31/2011 04/13/2012  Labs for ITP Cardiac and Pulmonary Rehab  Hemoglobin A1c <5.7 % 9.6 (NOTE)   The ADA recommends the following therapeutic goals for glycemic   control related to Hgb A1C measurement:   Goal of Therapy:   < 7.0% Hgb A1C   Action Suggested:  > 8.0% Hgb A1C   Ref:  Diabetes Care, 22, Suppl. 1, 1999  11.2  8.1      Pulmonary Assessment Scores:  Pulmonary Assessment Scores     Row Name 02/02/22 1722         ADL UCSD   ADL Phase Entry     SOB Score total 58     Rest 0     Walk 2     Stairs 5     Bath 1     Dress 2     Shop 1       CAT Score   CAT Score 25       mMRC Score   mMRC Score 4              UCSD: Self-administered rating of dyspnea associated with activities of daily living (ADLs) 6-point scale (0 = "not at all" to 5 = "maximal or unable to do because of breathlessness")  Scoring Scores range from 0 to 120.  Minimally important difference is 5 units  CAT: CAT can identify the health impairment of COPD patients and is better correlated with disease progression.  CAT has a scoring range of zero to 40. The CAT score is classified into four groups of low (less than 10), medium (10 - 20), high (21-30) and very high (31-40) based on the impact level of disease on health status. A CAT score over 10 suggests significant symptoms.  A worsening CAT score could be explained by an exacerbation, poor medication adherence, poor inhaler technique, or progression  of COPD or comorbid conditions.  CAT MCID is 2 points  mMRC: mMRC (Modified Medical Research Council) Dyspnea Scale is used to assess the degree of baseline functional disability  in patients of respiratory disease due to dyspnea. No minimal important difference is established. A decrease in score of 1 point or greater is considered a positive change.   Pulmonary Function Assessment:  Pulmonary Function Assessment - 01/26/22 1111       Breath   Shortness of Breath Yes;Limiting activity             Exercise Target Goals: Exercise Program Goal: Individual exercise prescription set using results from initial 6 min walk test and THRR while considering  patient's activity barriers and safety.   Exercise Prescription Goal: Initial exercise prescription builds to 30-45 minutes a day of aerobic activity, 2-3 days per week.  Home exercise guidelines will be given to patient during program as part of exercise prescription that the participant will acknowledge.  Education: Aerobic Exercise: - Group verbal and visual presentation on the components of exercise prescription. Introduces F.I.T.T principle from ACSM for exercise prescriptions.  Reviews F.I.T.T. principles of aerobic exercise including progression. Written material given at graduation.   Education: Resistance Exercise: - Group verbal and visual presentation on the components of exercise prescription. Introduces F.I.T.T principle from ACSM for exercise prescriptions  Reviews F.I.T.T. principles of resistance exercise including progression. Written material given at graduation.    Education: Exercise & Equipment Safety: - Individual verbal instruction and demonstration of equipment use and safety with use of the equipment. Flowsheet Row Pulmonary Rehab from 02/25/2022 in [facility] Cardiac and Pulmonary Rehab  Date 02/02/22  Educator [facility]  Instruction Review Code 1- Verbalizes Understanding       Education: Exercise Physiology &  General Exercise Guidelines: - Group verbal and written instruction with models to review the exercise physiology of the cardiovascular system and associated critical values. Provides general exercise guidelines with specific guidelines to those with heart or lung disease.    Education: Flexibility, Balance, Mind/Body Relaxation: - Group verbal and visual presentation with interactive activity on the components of exercise prescription. Introduces F.I.T.T principle from ACSM for exercise prescriptions. Reviews F.I.T.T. principles of flexibility and balance exercise training including progression. Also discusses the mind body connection.  Reviews various relaxation techniques to help reduce and manage stress (i.e. Deep breathing, progressive muscle relaxation, and visualization). Balance handout provided to take home. Written material given at graduation.   Activity Barriers & Risk Stratification:   6 Minute Walk:  6 Minute Walk     Row Name 02/02/22 1701         6 Minute Walk   Phase Initial     Distance 480 feet     Walk Time 5.94 minutes     # of Rest Breaks 2     MPH 0.91     METS 0.8     RPE 15     Perceived Dyspnea  3     VO2 Peak 3     Symptoms No     Resting HR 96 bpm     Resting BP 158/90     Resting Oxygen Saturation  94 %     Exercise Oxygen Saturation  during 6 min walk 90 %     Max Ex. HR 120 bpm     Max Ex. BP 160/80     2 Minute Post BP 140/70       Interval HR   1 Minute HR 98     2 Minute HR 110     3 Minute HR 112     4 Minute HR 118     5 Minute  HR 119     6 Minute HR 120     2 Minute Post HR 100     Interval Heart Rate? Yes       Interval Oxygen   Interval Oxygen? Yes     Baseline Oxygen Saturation % 94 %     1 Minute Oxygen Saturation % 90 %     1 Minute Liters of Oxygen 0 L     2 Minute Oxygen Saturation % 90 %     2 Minute Liters of Oxygen 0 L     3 Minute Oxygen Saturation % 91 %     3 Minute Liters of Oxygen 0 L     4 Minute Oxygen  Saturation % 92 %     4 Minute Liters of Oxygen 0 L     5 Minute Oxygen Saturation % 92 %     5 Minute Liters of Oxygen 0 L     6 Minute Oxygen Saturation % 93 %     6 Minute Liters of Oxygen 0 L     2 Minute Post Oxygen Saturation % 98 %     2 Minute Post Liters of Oxygen 0 L             Oxygen Initial Assessment:  Oxygen Initial Assessment - 02/02/22 1721       Home Oxygen   Home Oxygen Device None    Sleep Oxygen Prescription None    Home Exercise Oxygen Prescription None    Home Resting Oxygen Prescription None      Initial 6 min Walk   Oxygen Used None      Program Oxygen Prescription   Program Oxygen Prescription None      Intervention   Short Term Goals To learn and understand importance of maintaining oxygen saturations>88%;To learn and understand importance of monitoring SPO2 with pulse oximeter and demonstrate accurate use of the pulse oximeter.;To learn and demonstrate proper pursed lip breathing techniques or other breathing techniques. ;To learn and exhibit compliance with exercise, home and travel O2 prescription    Long  Term Goals Verbalizes importance of monitoring SPO2 with pulse oximeter and return demonstration;Exhibits proper breathing techniques, such as pursed lip breathing or other method taught during program session;Maintenance of O2 saturations>88%;Exhibits compliance with exercise, home  and travel O2 prescription             Oxygen Re-Evaluation:  Oxygen Re-Evaluation     Row Name 02/16/22 1023 02/27/22 0929           Program Oxygen Prescription   Program Oxygen Prescription -- None        Home Oxygen   Home Oxygen Device -- None      Sleep Oxygen Prescription -- None      Home Exercise Oxygen Prescription -- None      Home Resting Oxygen Prescription -- None        Goals/Expected Outcomes   Short Term Goals -- To learn and understand importance of monitoring SPO2 with pulse oximeter and demonstrate accurate use of the pulse  oximeter.;To learn and understand importance of maintaining oxygen saturations>88%      Long  Term Goals -- Maintenance of O2 saturations>88%;Verbalizes importance of monitoring SPO2 with pulse oximeter and return demonstration      Comments Reviewed PLB technique with pt.  Talked about how it works and it's importance in maintaining their exercise saturations. He has a pulse oximeter to check his oxygen saturation at home.  Informed and explained why it is important to have one. Reviewed that oxygen saturations should be 88 percent and above. Patient verbalizes understanding.      Goals/Expected Outcomes Short: Become more profiecient at using PLB. Long: Become independent at using PLB. Short: monitor oxygen at home with exertion. Long: maintain oxygen saturations above 88 percent independently.               Oxygen Discharge (Final Oxygen Re-Evaluation):  Oxygen Re-Evaluation - 02/27/22 0929       Program Oxygen Prescription   Program Oxygen Prescription None      Home Oxygen   Home Oxygen Device None    Sleep Oxygen Prescription None    Home Exercise Oxygen Prescription None    Home Resting Oxygen Prescription None      Goals/Expected Outcomes   Short Term Goals To learn and understand importance of monitoring SPO2 with pulse oximeter and demonstrate accurate use of the pulse oximeter.;To learn and understand importance of maintaining oxygen saturations>88%    Long  Term Goals Maintenance of O2 saturations>88%;Verbalizes importance of monitoring SPO2 with pulse oximeter and return demonstration    Comments He has a pulse oximeter to check his oxygen saturation at home. Informed and explained why it is important to have one. Reviewed that oxygen saturations should be 88 percent and above. Patient verbalizes understanding.    Goals/Expected Outcomes Short: monitor oxygen at home with exertion. Long: maintain oxygen saturations above 88 percent independently.             Initial  Exercise Prescription:  Initial Exercise Prescription - 02/02/22 1700       Date of Initial Exercise RX and Referring Provider   Date 02/02/22    Referring Provider [name]      Oxygen   Maintain Oxygen Saturation 88% or higher      Treadmill   MPH 0.8    Grade 0    Minutes 15    METs 1.6      NuStep   Level 1    SPM 80    Minutes 15    METs 1      REL-XR   Level 1    Speed 50    Minutes 15    METs 1      T5 Nustep   Level 1    SPM 80    Minutes 15    METs 1      Biostep-RELP   Level 1    SPM 50    Minutes 15    METs 1      Prescription Details   Frequency (times per week) 3    Duration Progress to 30 minutes of continuous aerobic without signs/symptoms of physical distress      Intensity   THRR 40-80% of Max Heartrate 116-136    Ratings of Perceived Exertion 11-13    Perceived Dyspnea 0-4      Progression   Progression Continue to progress workloads to maintain intensity without signs/symptoms of physical distress.      Resistance Training   Training Prescription Yes    Weight 2    Reps 10-15             Perform Capillary Blood Glucose checks as needed.  Exercise Prescription Changes:   Exercise Prescription Changes     Row Name 02/02/22 1700 03/02/22 1400           Response to Exercise   Blood Pressure (Admit) 158/90 142/72  Blood Pressure (Exercise) 160/80 128/60      Blood Pressure (Exit) 140/70 128/70      Heart Rate (Admit) 96 bpm 95 bpm      Heart Rate (Exercise) 120 bpm 115 bpm      Heart Rate (Exit) 100 bpm 92 bpm      Oxygen Saturation (Admit) 94 % 98 %      Oxygen Saturation (Exercise) 90 % 90 %      Oxygen Saturation (Exit) 98 % 94 %      Rating of Perceived Exertion (Exercise) 15 13      Perceived Dyspnea (Exercise) 3 3      Symptoms none SOB      Comments 6 MWT results 3rd full day of exercise      Duration -- Progress to 30 minutes of  aerobic without signs/symptoms of physical distress      Intensity --  THRR unchanged        Progression   Progression -- Continue to progress workloads to maintain intensity without signs/symptoms of physical distress.      Average METs -- 2.18        Resistance Training   Training Prescription -- Yes      Weight -- 2 lb      Reps -- 10-15        Interval Training   Interval Training -- No        Recumbant Bike   Level -- 1      Minutes -- 15      METs -- 1.9        NuStep   Level -- 3      Minutes -- 30      METs -- 2.3        T5 Nustep   Level -- 3      Minutes -- 30      METs -- 1.9        Oxygen   Maintain Oxygen Saturation -- 88% or higher               Exercise Comments:   Exercise Comments     Row Name 02/16/22 1022           Exercise Comments First full day of exercise!  Patient was oriented to gym and equipment including functions, settings, policies, and procedures.  Patient's individual exercise prescription and treatment plan were reviewed.  All starting workloads were established based on the results of the 6 minute walk test done at initial orientation visit.  The plan for exercise progression was also introduced and progression will be customized based on patient's performance and goals.                Exercise Goals and Review:   Exercise Goals     Row Name 02/02/22 1713             Exercise Goals   Increase Physical Activity Yes       Intervention Provide advice, education, support and counseling about physical activity/exercise needs.;Develop an individualized exercise prescription for aerobic and resistive training based on initial evaluation findings, risk stratification, comorbidities and participant's personal goals.       Expected Outcomes Short Term: Attend rehab on a regular basis to increase amount of physical activity.;Long Term: Add in home exercise to make exercise part of routine and to increase amount of physical activity.;Long Term: Exercising regularly at least 3-5 days a week.  Increase Strength and Stamina Yes       Intervention Provide advice, education, support and counseling about physical activity/exercise needs.;Develop an individualized exercise prescription for aerobic and resistive training based on initial evaluation findings, risk stratification, comorbidities and participant's personal goals.       Expected Outcomes Short Term: Increase workloads from initial exercise prescription for resistance, speed, and METs.;Short Term: Perform resistance training exercises routinely during rehab and add in resistance training at home;Long Term: Improve cardiorespiratory fitness, muscular endurance and strength as measured by increased METs and functional capacity (6MWT)       Able to understand and use rate of perceived exertion (RPE) scale Yes       Intervention Provide education and explanation on how to use RPE scale       Expected Outcomes Short Term: Able to use RPE daily in rehab to express subjective intensity level;Long Term:  Able to use RPE to guide intensity level when exercising independently       Able to understand and use Dyspnea scale Yes       Intervention Provide education and explanation on how to use Dyspnea scale       Expected Outcomes Short Term: Able to use Dyspnea scale daily in rehab to express subjective sense of shortness of breath during exertion;Long Term: Able to use Dyspnea scale to guide intensity level when exercising independently       Knowledge and understanding of Target Heart Rate Range (THRR) Yes       Intervention Provide education and explanation of THRR including how the numbers were predicted and where they are located for reference       Expected Outcomes Short Term: Able to state/look up THRR;Long Term: Able to use THRR to govern intensity when exercising independently;Short Term: Able to use daily as guideline for intensity in rehab       Able to check pulse independently Yes       Intervention Provide education and demonstration  on how to check pulse in carotid and radial arteries.;Review the importance of being able to check your own pulse for safety during independent exercise       Expected Outcomes Short Term: Able to explain why pulse checking is important during independent exercise;Long Term: Able to check pulse independently and accurately       Understanding of Exercise Prescription Yes       Intervention Provide education, explanation, and written materials on patient's individual exercise prescription       Expected Outcomes Short Term: Able to explain program exercise prescription;Long Term: Able to explain home exercise prescription to exercise independently                Exercise Goals Re-Evaluation :  Exercise Goals Re-Evaluation     Row Name 02/16/22 1022 03/02/22 1442           Exercise Goal Re-Evaluation   Exercise Goals Review Able to understand and use rate of perceived exertion (RPE) scale;Able to understand and use Dyspnea scale;Knowledge and understanding of Target Heart Rate Range (THRR);Understanding of Exercise Prescription Increase Physical Activity;Increase Strength and Stamina;Understanding of Exercise Prescription      Comments Reviewed RPE scale, THR and program prescription with pt today.  Pt voiced understanding and was given a copy of goals to take home. [name] is doing well for the first couple of sessions he has been here. He has not walked yet due to MSK limitations. He has been working at level 3 on both  the T5 and T4 machines. Oxygen saturations are staying well above 88%. We will continue to monitor as he progresses in the program. We hope to see some walking into his regimen if he is able to at some point.      Expected Outcomes Short: Use RPE daily to regulate intensity. Long: Follow program prescription in THR. Short: Continue exercise prescription, start with slow walking if able Long: Increase overall MET level               Discharge Exercise Prescription (Final  Exercise Prescription Changes):  Exercise Prescription Changes - 03/02/22 1400       Response to Exercise   Blood Pressure (Admit) 142/72    Blood Pressure (Exercise) 128/60    Blood Pressure (Exit) 128/70    Heart Rate (Admit) 95 bpm    Heart Rate (Exercise) 115 bpm    Heart Rate (Exit) 92 bpm    Oxygen Saturation (Admit) 98 %    Oxygen Saturation (Exercise) 90 %    Oxygen Saturation (Exit) 94 %    Rating of Perceived Exertion (Exercise) 13    Perceived Dyspnea (Exercise) 3    Symptoms SOB    Comments 3rd full day of exercise    Duration Progress to 30 minutes of  aerobic without signs/symptoms of physical distress    Intensity THRR unchanged      Progression   Progression Continue to progress workloads to maintain intensity without signs/symptoms of physical distress.    Average METs 2.18      Resistance Training   Training Prescription Yes    Weight 2 lb    Reps 10-15      Interval Training   Interval Training No      Recumbant Bike   Level 1    Minutes 15    METs 1.9      NuStep   Level 3    Minutes 30    METs 2.3      T5 Nustep   Level 3    Minutes 30    METs 1.9      Oxygen   Maintain Oxygen Saturation 88% or higher             Nutrition:  Target Goals: Understanding of nutrition guidelines, daily intake of sodium <1515m, cholesterol <2030m calories 30% from fat and 7% or less from saturated fats, daily to have 5 or more servings of fruits and vegetables.  Education: All About Nutrition: -Group instruction provided by verbal, written material, interactive activities, discussions, models, and posters to present general guidelines for heart healthy nutrition including fat, fiber, MyPlate, the role of sodium in heart healthy nutrition, utilization of the nutrition label, and utilization of this knowledge for meal planning. Follow up email sent as well. Written material given at graduation. Flowsheet [facility]  Education need identified 02/02/22       Biometrics:  Pre Biometrics - 02/02/22 1714       Pre Biometrics   Height 5' 10.75" (1.797 m)    Weight 307 lb 11.2 oz (139.6 kg)    Waist Circumference 56 inches    Hip Circumference 54 inches    Waist to Hip Ratio 1.04 %    BMI (Calculated) 43.22    Single Leg Stand 0 seconds              Nutrition Therapy Plan and Nutrition Goals:  Nutrition Therapy & Goals -  02/02/22 1607       Nutrition Therapy   Diet Heart healthy, low Na, T2DM    Drug/Food Interactions Statins/Certain Fruits    Protein (specify units) 80-85g    Fiber 30 grams    Whole Grain Foods 3 servings    Saturated Fats 16 max. grams    Fruits and Vegetables 8 servings/day    Sodium 2 grams      Personal Nutrition Goals   Nutrition Goal ST: practice building balanced meals with fiber, healthy fat, and protein. Continue to limit sodium. LT: limit Na <2g/day, follow MyPlate guidelines, manage A1C <7%    Comments 71 y.o. M admitted to pulmonary rehab for heart failure. PMHx includes T2DM, HTN, CAD, HLD, CKD stg 2. Relevant medications includes lipitor, ferrous sulfate, vitamin C, B-12, furosemide, novolog, lantus. He has a continuous meter and uses his short acting insulin using his current BG. [name] reports living with his son and his granddaughter. he is now drinking mostly water and limiting sodium. B: coffee with doughnut or muffin or poptart and some water L: leftovers, sandwich (pimento cheese or peanut butter - used to have deli meat) D: fruit - apples or sandwich (pimento cheese or peanut butter - used to have deli meat), non-starchy vegetables ("anything and everything" - he enjoys going to the farmers market), potatoes, steak, chicken, fish (cod). He cooks meat with olive oil on pan - hes getting used ot an air fryer. S: does not snack late at night anymore. Sometimes he rpeorts just hvaing a baked potato to eat. Discussed MyPlate and balancing  meals as well as heart healthy eating and T2DM MNT.      Intervention Plan   Intervention Prescribe, educate and counsel regarding individualized specific dietary modifications aiming towards targeted core components such as weight, hypertension, lipid management, diabetes, heart failure and other comorbidities.;Nutrition handout(s) given to patient.    Expected Outcomes Short Term Goal: Understand basic principles of dietary content, such as calories, fat, sodium, cholesterol and nutrients.;Short Term Goal: A plan has been developed with personal nutrition goals set during dietitian appointment.;Long Term Goal: Adherence to prescribed nutrition plan.             Nutrition Assessments:  MEDIFICTS Score Key: ?70 Need to make dietary changes  40-70 Heart Healthy Diet ? 40 Therapeutic Level Cholesterol Diet   Picture Your Plate Scores: <13 Unhealthy dietary pattern with much room for improvement. 41-50 Dietary pattern unlikely to meet recommendations for good health and room for improvement. 51-60 More healthful dietary pattern, with some room for improvement.  >60 Healthy dietary pattern, although there may be some specific behaviors that could be improved.   Nutrition Goals Re-Evaluation:  Nutrition Goals Re-Evaluation     [place] Name 02/27/22 0935             Goals   Current Weight 306 lb (138.8 kg)       Nutrition Goal Eat a diet that helps his sugar.       Comment [name] is taking his medication properly to help his diabetes. He is taking his shots three times a day before each meal. His A1c is around 6.7. He wants to continue to work on his blood sugar levels.       Expected Outcome Short: eat foods that help with his diabetes. Long: reduce A1c.                Nutrition Goals Discharge (Final Nutrition Goals Re-Evaluation):  Nutrition Goals Re-Evaluation -  02/27/22 0935       Goals   Current Weight 306 lb (138.8 kg)    Nutrition Goal Eat a diet that helps his  sugar.    Comment [name] is taking his medication properly to help his diabetes. He is taking his shots three times a day before each meal. His A1c is around 6.7. He wants to continue to work on his blood sugar levels.    Expected Outcome Short: eat foods that help with his diabetes. Long: reduce A1c.             Psychosocial: Target Goals: Acknowledge presence or absence of significant depression and/or stress, maximize coping skills, provide positive support system. Participant is able to verbalize types and ability to use techniques and skills needed for reducing stress and depression.   Education: Stress, Anxiety, and Depression - Group verbal and visual presentation to define topics covered.  Reviews how body is impacted by stress, anxiety, and depression.  Also discusses healthy ways to reduce stress and to treat/manage anxiety and depression.  Written material given at graduation. Flowsheet Row Pulmonary Rehab from 02/25/2022 [facility] Re[facility]  Education need identified 02/02/22       Education: Sleep Hygiene -Provides group verbal and written instruction about how sleep can affect your health.  Define sleep hygiene, discuss sleep cycles and impact of sleep habits. Review good sleep hygiene tips.    Initial Review & Psychosocial Screening:  Initial Psych Review & Screening - 01/26/22 1113       Initial Review   Current issues with None Identified      Family Dynamics   Good Support System? Yes    Comments He can look to his son and grandaughter for support. He is concerned about his health due to his fluid build up and shortness of breath.      Barriers   Psychosocial barriers to participate in program The patient should benefit from training in stress management and relaxation.      Screening Interventions   Interventions Encouraged to exercise;To provide support and resources with identified psychosocial needs;Provide feedback about the scores to  participant    Expected Outcomes Long Term Goal: Stressors or current issues are controlled or eliminated.;Short Term goal: Utilizing psychosocial counselor, staff and physician to assist with identification of specific Stressors or current issues interfering with healing process. Setting desired goal for each stressor or current issue identified.;Short Term goal: Identification and review with participant of any Quality of Life or Depression concerns found by scoring the questionnaire.;Long Term goal: The participant improves quality of Life and PHQ9 Scores as seen by post scores and/or verbalization of changes             Quality of Life Scores:  Quality of Life - 02/02/22 1716       Quality of Life   Select Quality of Life      Quality of Life Scores   Health/Function Pre 13.38 %    Socioeconomic Pre 25.5 %    Psych/Spiritual Pre 29.14 %    Family Pre 30 %    GLOBAL Pre 21.43 %            Scores of 19 and below usually indicate a poorer quality of life in these areas.  A difference of  2-3 points is a clinically meaningful difference.  A difference of 2-3 points in the total score of the Quality of Life Index has been associated with significant improvement in  overall quality of life, self-image, physical symptoms, and general health in studies assessing change in quality of life.  PHQ-9: Review Flowsheet       02/27/2022 02/02/2022 10/16/2021  Depression screen PHQ 2/9  Decreased Interest 0 0 0  Down, Depressed, Hopeless 0 0 0  PHQ - 2 Score 0 0 0  Altered sleeping 0 0 -  Tired, decreased energy 2 3 -  Change in appetite 2 2 -  Feeling bad or failure about yourself  0 0 -  Trouble concentrating 0 0 -  Moving slowly or fidgety/restless 0 1 -  Suicidal thoughts 0 0 -  PHQ-9 Score 4 6 -  Difficult doing work/chores Not difficult at all Somewhat difficult -   Interpretation of Total Score  Total Score Depression Severity:  1-4 = Minimal depression, 5-9 = Mild  depression, 10-14 = Moderate depression, 15-19 = Moderately severe depression, 20-27 = Severe depression   Psychosocial Evaluation and Intervention:  Psychosocial Evaluation - 01/26/22 1115       Psychosocial Evaluation & Interventions   Interventions Stress management education;Relaxation education;Encouraged to exercise with the program and follow exercise prescription    Comments He can look to his son and grandaughter for support. He is concerned about his health due to his fluid build up and shortness of breath.    Expected Outcomes Short: Start [facility] to help with mood. Long: Maintain a healthy mental state.    Continue Psychosocial Services  Follow up required by staff             Psychosocial Re-Evaluation:  Psychosocial Re-Evaluation     [place] Name 02/27/22 [id number]             Psychosocial Re-Evaluation   Current issues with None Identified       Comments Reviewed patient health questionnaire (PHQ-9) with patient for follow up. Previously, patients score indicated signs/symptoms of depression.  Reviewed to see if patient is improving symptom wise while in program.  Score improved and patient states that it is because he is able to exercise more.       Expected Outcomes Short: Continue to attend [facility] regularly for regular exercise and social engagement. Long: Continue to improve symptoms and manage a positive mental state.       Interventions Encouraged to attend Pulmonary Rehabilitation for the exercise       Continue Psychosocial Services  Follow up required by staff                Psychosocial Discharge (Final Psychosocial Re-Evaluation):  Psychosocial Re-Evaluation - 02/27/22 0933       Psychosocial Re-Evaluation   Current issues with None Identified    Comments Reviewed patient health questionnaire (PHQ-9) with patient for follow up. Previously, patients score indicated signs/symptoms of depression.  Reviewed to see if patient is improving symptom wise  while in program.  Score improved and patient states that it is because he is able to exercise more.    Expected Outcomes Short: Continue to attend [facility] regularly for regular exercise and social engagement. Long: Continue to improve symptoms and manage a positive mental state.    Interventions Encouraged to attend Pulmonary Rehabilitation for the exercise    Continue Psychosocial Services  Follow up required by staff             Education: Education Goals: Education classes will be provided on a weekly basis, covering required topics. Participant will state understanding/return demonstration of topics presented.  Learning Barriers/Preferences:  Learning Barriers/Preferences - 01/26/22 1112       Learning Barriers/Preferences   Learning Barriers None    Learning Preferences None             General Pulmonary Education Topics:  Infection Prevention: - Provides verbal and written material to individual with discussion of infection control including proper hand washing and proper equipment cleaning during exercise session. Flowsheet Row Pulmonary Rehab from 02/25/2022 in [facility] Cardiac and Pulmonary Rehab  Date 02/02/22  Educator [facility]  Instruction Review Code 1- Verbalizes Understanding       Falls Prevention: - Provides verbal and written material to individual with discussion of falls prevention and safety. Flowsheet Row Pulmonary Rehab from 02/25/2022 in [facility] Cardiac and Pulmonary Rehab  Date 02/02/22  Educator [facility]  Instruction Review Code 1- Verbalizes Understanding       Chronic Lung Disease Review: - Group verbal instruction with posters, models, PowerPoint presentations and videos,  to review new updates, new respiratory medications, new advancements in procedures and treatments. Providing information on websites and "800" numbers for continued self-education. Includes information about supplement oxygen, available portable oxygen systems, continuous and  intermittent flow rates, oxygen safety, concentrators, and Medicare reimbursement for oxygen. Explanation of Pulmonary Drugs, including class, frequency, complications, importance of spacers, rinsing mouth after steroid MDI's, and proper cleaning methods for nebulizers. Review of basic lung anatomy and physiology related to function, structure, and complications of lung disease. Review of risk factors. Discussion about methods for diagnosing sleep apnea and types of masks and machines for OSA. Includes a review of the use of types of environmental controls: home humidity, furnaces, filters, dust mite/pet prevention, HEPA vacuums. Discussion about weather changes, air quality and the benefits of nasal washing. Instruction on Warning signs, infection symptoms, calling MD promptly, preventive modes, and value of vaccinations. Review of effective airway clearance, coughing and/or vibration techniques. Emphasizing that all should Create an Action Plan. Written material given at graduation.   AED/CPR: - Group verbal and written instruction with the use of models to demonstrate the basic use of the AED with the basic ABC's of resuscitation.    Anatomy and Cardiac Procedures: - Group verbal and visual presentation and models provide information about basic cardiac anatomy and function. Reviews the testing methods done to diagnose heart disease and the outcomes of the test results. Describes the treatment choices: Medical Management, Angioplasty, or Coronary Bypass Surgery for treating various heart conditions including Myocardial Infarction, Angina, Valve Disease, and Cardiac Arrhythmias.  Written material given at graduation. Flowsheet Row Pulmonary Rehab from 02/25/2022 in [facility] Cardiac and Pulmonary Rehab  Education need identified 02/02/22       Medication Safety: - Group verbal and visual instruction to review commonly prescribed medications for heart and lung disease. Reviews the medication, class of  the drug, and side effects. Includes the steps to properly store meds and maintain the prescription regimen.  Written material given at graduation. Flowsheet Row Pulmonary Rehab from 02/25/2022 in [facility] Cardiac and Pulmonary Rehab  Date 02/25/22  Educator [facility]  Instruction Review Code 1- Verbalizes Understanding       Other: -Provides group and verbal instruction on various topics (see comments)   Knowledge Questionnaire Score:  Knowledge Questionnaire Score - 02/02/22 1720       Knowledge Questionnaire Score   Pre Score 21/26              Core Components/Risk Factors/Patient Goals at Admission:  Personal Goals and Risk Factors at Admission - 02/02/22 1720  Core Components/Risk Factors/Patient Goals on Admission    Weight Management Yes;Weight Loss;Obesity    Intervention Weight Management: Develop a combined nutrition and exercise program designed to reach desired caloric intake, while maintaining appropriate intake of nutrient and fiber, sodium and fats, and appropriate energy expenditure required for the weight goal.;Weight Management: Provide education and appropriate resources to help participant work on and attain dietary goals.;Weight Management/Obesity: Establish reasonable short term and long term weight goals.;Obesity: Provide education and appropriate resources to help participant work on and attain dietary goals.    Admit Weight 307 lb 11.2 oz (139.6 kg)    Goal Weight: Short Term 295 lb (133.8 kg)    Goal Weight: Long Term 250 lb (113.4 kg)    Expected Outcomes Short Term: Continue to assess and modify interventions until short term weight is achieved;Long Term: Adherence to nutrition and physical activity/exercise program aimed toward attainment of established weight goal;Weight Maintenance: Understanding of the daily nutrition guidelines, which includes 25-35% calories from fat, 7% or less cal from saturated fats, less than 246m cholesterol, less than 1.5gm of  sodium, & 5 or more servings of fruits and vegetables daily;Weight Loss: Understanding of general recommendations for a balanced deficit meal plan, which promotes 1-2 lb weight loss per week and includes a negative energy balance of [phone] kcal/d;Understanding recommendations for meals to include 15-35% energy as protein, 25-35% energy from fat, 35-60% energy from carbohydrates, less than 207mof dietary cholesterol, 20-35 gm of total fiber daily;Understanding of distribution of calorie intake throughout the day with the consumption of 4-5 meals/snacks    Improve shortness of breath with ADL's Yes    Intervention Provide education, individualized exercise plan and daily activity instruction to help decrease symptoms of SOB with activities of daily living.    Expected Outcomes Short Term: Improve cardiorespiratory fitness to achieve a reduction of symptoms when performing ADLs;Long Term: Be able to perform more ADLs without symptoms or delay the onset of symptoms    Diabetes Yes    Intervention Provide education about signs/symptoms and action to take for hypo/hyperglycemia.;Provide education about proper nutrition, including hydration, and aerobic/resistive exercise prescription along with prescribed medications to achieve blood glucose in normal ranges: Fasting glucose 65-99 mg/dL    Expected Outcomes Short Term: Participant verbalizes understanding of the signs/symptoms and immediate care of hyper/hypoglycemia, proper foot care and importance of medication, aerobic/resistive exercise and nutrition plan for blood glucose control.;Long Term: Attainment of HbA1C < 7%.    Heart Failure Yes    Intervention Provide a combined exercise and nutrition program that is supplemented with education, support and counseling about heart failure. Directed toward relieving symptoms such as shortness of breath, decreased exercise tolerance, and extremity edema.    Expected Outcomes Improve functional capacity of  life;Short term: Attendance in program 2-3 days a week with increased exercise capacity. Reported lower sodium intake. Reported increased fruit and vegetable intake. Reports medication compliance.;Short term: Daily weights obtained and reported for increase. Utilizing diuretic protocols set by physician.;Long term: Adoption of self-care skills and reduction of barriers for early signs and symptoms recognition and intervention leading to self-care maintenance.    Hypertension Yes    Intervention Provide education on lifestyle modifcations including regular physical activity/exercise, weight management, moderate sodium restriction and increased consumption of fresh fruit, vegetables, and low fat dairy, alcohol moderation, and smoking cessation.;Monitor prescription use compliance.    Expected Outcomes Short Term: Continued assessment and intervention until BP is < 140/9059mG in hypertensive participants. < 130/69m24m  in hypertensive participants with diabetes, heart failure or chronic kidney disease.;Long Term: Maintenance of blood pressure at goal levels.    Lipids Yes    Intervention Provide education and support for participant on nutrition & aerobic/resistive exercise along with prescribed medications to achieve LDL <78m, HDL >477m    Expected Outcomes Short Term: Participant states understanding of desired cholesterol values and is compliant with medications prescribed. Participant is following exercise prescription and nutrition guidelines.;Long Term: Cholesterol controlled with medications as prescribed, with individualized exercise RX and with personalized nutrition plan. Value goals: LDL < 7067mHDL > 40 mg.             Education:Diabetes - Individual verbal and written instruction to review signs/symptoms of diabetes, desired ranges of glucose level fasting, after meals and with exercise. Acknowledge that pre and post exercise glucose checks will be done for 3 sessions at entry of  program. [facility]  Date 02/02/22  [facility]- Ver[facility]       Know Your Numbers and Heart Failure: - Group verbal and visual instruction to discuss disease risk factors for cardiac and pulmonary disease and treatment options.  Reviews associated critical values for Overweight/Obesity, Hypertension, Cholesterol, and Diabetes.  Discusses basics of heart failure: signs/symptoms and treatments.  Introduces Heart Failure Zone chart for action plan for heart failure.  Written material given at graduation. Flowsheet [facility]  Education need identified 02/02/22       Core Components/Risk Factors/Patient Goals Review:   Goals and Risk Factor Review     Row Name 02/27/22 0928             Core Components/Risk Factors/Patient Goals Review   Personal Goals Review Improve shortness of breath with ADL's       Review Spoke to patient about their shortness of breath and what they can do to improve. Patient has been informed of breathing techniques when starting the program. Patient is informed to tell staff if they have had any med changes and that certain meds they are taking or not taking can be causing shortness of breath.       Expected Outcomes Short: Attend [facility] regularly to improve shortness of breath with ADL's. Long: maintain independence with ADL's                Core Components/Risk Factors/Patient Goals at Discharge (Final Review):   Goals and Risk Factor Review - 02/27/22 0928       Core Components/Risk Factors/Patient Goals Review   Personal Goals Review Improve shortness of breath with ADL's    Review Spoke to patient about their shortness of breath and what they can do to improve. Patient has been informed of breathing techniques when starting the program. Patient is informed to tell staff if  they have had any med changes and that certain meds they are taking or not taking can be causing shortness of breath.    Expected Outcomes Short: Attend [facility] regularly to improve shortness of breath with ADL's. Long: maintain independence with ADL's             ITP Comments:  ITP Comments     Row Name 01/26/22 1110 02/02/22 1701 02/11/22 1051 02/16/22 1022 03/11/22 1119   ITP Comments Virtual Visit completed. Patient informed on EP and RD appointment and 6 Minute walk test. Patient also informed of patient  health questionnaires on My Chart. Patient Verbalizes understanding. Visit diagnosis can be found in [facility] 12/23/2021. Completed 6MWT and gym orientation. Initial ITP created and sent for review to Dr. Zetta Bills, Medical Director. 30 Day review completed. Medical Director ITP review done, changes made as directed, and signed approval by Medical Director.   new to program First full day of exercise!  Patient was oriented to gym and equipment including functions, settings, policies, and procedures.  Patient's individual exercise prescription and treatment plan were reviewed.  All starting workloads were established based on the results of the 6 minute walk test done at initial orientation visit.  The plan for exercise progression was also introduced and progression will be customized based on patient's performance and goals. 30 Day review completed. Medical Director ITP review done, changes made as directed, and signed approval by Medical Director.    new to program            Comments:

## 2022-03-13 ENCOUNTER — Ambulatory Visit: Payer: Self-pay | Admitting: *Deleted

## 2022-03-13 DIAGNOSIS — I2510 Atherosclerotic heart disease of native coronary artery without angina pectoris: Secondary | ICD-10-CM | POA: Diagnosis not present

## 2022-03-13 DIAGNOSIS — I700 Atherosclerosis of aorta: Secondary | ICD-10-CM | POA: Diagnosis not present

## 2022-03-13 DIAGNOSIS — E119 Type 2 diabetes mellitus without complications: Secondary | ICD-10-CM | POA: Diagnosis not present

## 2022-03-13 DIAGNOSIS — I509 Heart failure, unspecified: Secondary | ICD-10-CM | POA: Diagnosis not present

## 2022-03-13 DIAGNOSIS — J90 Pleural effusion, not elsewhere classified: Secondary | ICD-10-CM | POA: Diagnosis not present

## 2022-03-13 DIAGNOSIS — R0602 Shortness of breath: Secondary | ICD-10-CM | POA: Diagnosis not present

## 2022-03-13 NOTE — Patient Outreach (Signed)
  Care Coordination   03/13/2022 Name: [name] MRN: [id number] DOB: 1950-04-24   Care Coordination Outreach Attempts:  An unsuccessful telephone outreach was attempted for a scheduled appointment today.  Follow Up Plan:  Additional outreach attempts will be made to offer the patient care coordination information and services.   Encounter Outcome:  No Answer   Care Coordination Interventions:  No, not indicated    Valente David, RN, MSN, [facility] Care Management Care Management Coordinator [phone]

## 2022-03-18 ENCOUNTER — Encounter: Payer: Medicare HMO | Admitting: *Deleted

## 2022-03-19 DIAGNOSIS — R809 Proteinuria, unspecified: Secondary | ICD-10-CM | POA: Diagnosis not present

## 2022-03-19 DIAGNOSIS — N0421 Primary membranous nephropathy with nephrotic syndrome: Secondary | ICD-10-CM | POA: Diagnosis not present

## 2022-03-19 DIAGNOSIS — N2581 Secondary hyperparathyroidism of renal origin: Secondary | ICD-10-CM | POA: Diagnosis not present

## 2022-03-19 DIAGNOSIS — R600 Localized edema: Secondary | ICD-10-CM | POA: Diagnosis not present

## 2022-03-19 DIAGNOSIS — N189 Chronic kidney disease, unspecified: Secondary | ICD-10-CM | POA: Diagnosis not present

## 2022-03-19 DIAGNOSIS — E1122 Type 2 diabetes mellitus with diabetic chronic kidney disease: Secondary | ICD-10-CM | POA: Diagnosis not present

## 2022-03-19 DIAGNOSIS — I10 Essential (primary) hypertension: Secondary | ICD-10-CM | POA: Diagnosis not present

## 2022-03-20 ENCOUNTER — Encounter: Payer: Medicare HMO | Attending: Internal Medicine

## 2022-03-20 ENCOUNTER — Encounter: Payer: Self-pay | Admitting: Nephrology

## 2022-03-20 DIAGNOSIS — I5022 Chronic systolic (congestive) heart failure: Secondary | ICD-10-CM | POA: Insufficient documentation

## 2022-03-20 LAB — SURGICAL PATHOLOGY

## 2022-03-23 ENCOUNTER — Encounter: Payer: Medicare HMO | Admitting: *Deleted

## 2022-03-24 ENCOUNTER — Ambulatory Visit
Admission: RE | Admit: 2022-03-24 | Discharge: 2022-03-24 | Disposition: A | Discharge status: Home / Self Care | Payer: Medicare HMO | Source: Ambulatory Visit | Attending: Internal Medicine | Admitting: Internal Medicine

## 2022-03-24 ENCOUNTER — Ambulatory Visit
Admission: RE | Admit: 2022-03-24 | Discharge: 2022-03-24 | Disposition: A | Discharge status: Home / Self Care | Payer: Medicare HMO | Source: Ambulatory Visit | Attending: Pulmonary Disease | Admitting: Pulmonary Disease

## 2022-03-24 ENCOUNTER — Other Ambulatory Visit: Payer: Self-pay | Admitting: Internal Medicine

## 2022-03-24 ENCOUNTER — Telehealth: Payer: Self-pay

## 2022-03-24 ENCOUNTER — Inpatient Hospital Stay: Payer: Medicare HMO | Attending: Oncology

## 2022-03-24 ENCOUNTER — Other Ambulatory Visit: Payer: Self-pay | Admitting: Pulmonary Disease

## 2022-03-24 DIAGNOSIS — J90 Pleural effusion, not elsewhere classified: Secondary | ICD-10-CM

## 2022-03-24 DIAGNOSIS — R0609 Other forms of dyspnea: Secondary | ICD-10-CM | POA: Insufficient documentation

## 2022-03-24 DIAGNOSIS — Z9889 Other specified postprocedural states: Secondary | ICD-10-CM | POA: Diagnosis not present

## 2022-03-24 DIAGNOSIS — R188 Other ascites: Secondary | ICD-10-CM | POA: Diagnosis not present

## 2022-03-24 DIAGNOSIS — D509 Iron deficiency anemia, unspecified: Secondary | ICD-10-CM | POA: Insufficient documentation

## 2022-03-24 DIAGNOSIS — R0602 Shortness of breath: Secondary | ICD-10-CM | POA: Diagnosis not present

## 2022-03-24 DIAGNOSIS — Z79899 Other long term (current) drug therapy: Secondary | ICD-10-CM | POA: Insufficient documentation

## 2022-03-24 LAB — BODY FLUID CELL COUNT WITH DIFFERENTIAL
Eos, Fluid: 0 %
Lymphs, Fluid: 50 %
Monocyte-Macrophage-Serous Fluid: 18 %
Neutrophil Count, Fluid: 32 %
Total Nucleated Cell Count, Fluid: 119 cu mm

## 2022-03-24 LAB — GLUCOSE, PLEURAL OR PERITONEAL FLUID: Glucose, Fluid: 200 mg/dL

## 2022-03-24 LAB — PROTEIN, PLEURAL OR PERITONEAL FLUID: Total protein, fluid: <3 g/dL

## 2022-03-24 LAB — LACTATE DEHYDROGENASE, PLEURAL OR PERITONEAL FLUID: LD, Fluid: 46 U/L — ABNORMAL HIGH (ref 3–23)

## 2022-03-24 LAB — AMYLASE, PLEURAL OR PERITONEAL FLUID: Amylase, Fluid: 22 U/L

## 2022-03-24 MED ORDER — LIDOCAINE HCL (PF) 1 % IJ SOLN
10.0000 mL | Freq: Once | INTRAMUSCULAR | Status: AC
  Administered 2022-03-24: 10 mL via INTRADERMAL
  Filled 2022-03-24: qty 10

## 2022-03-24 NOTE — Telephone Encounter (Signed)
We contacted pt due to his absence from pulmonary rehab since 02/27/2022. We spoke to the pt's son who informed us that he was undergoing a thoracentesis today, 03/24/2022. Pt will contact us once he recovers from his procedure to inform us on when he plans on returning to pulmonary rehab.

## 2022-03-24 NOTE — Discharge Instructions (Signed)
Discharge instructions reviewed with patient.

## 2022-03-24 NOTE — Procedures (Signed)
PROCEDURE SUMMARY:  Successful US guided diagnostic and therapeutic left thoracentesis. Yielded 1.6 L of clear, yellow fluid. Pt tolerated procedure well. No immediate complications.  Specimen was sent for labs. CXR ordered.  EBL < 1 mL  Tyson Alias, AGNP 03/24/2022 3:22 PM

## 2022-03-25 ENCOUNTER — Inpatient Hospital Stay: Payer: Medicare HMO

## 2022-03-25 DIAGNOSIS — E1122 Type 2 diabetes mellitus with diabetic chronic kidney disease: Secondary | ICD-10-CM | POA: Diagnosis not present

## 2022-03-25 DIAGNOSIS — I10 Essential (primary) hypertension: Secondary | ICD-10-CM | POA: Diagnosis not present

## 2022-03-25 DIAGNOSIS — E119 Type 2 diabetes mellitus without complications: Secondary | ICD-10-CM | POA: Diagnosis not present

## 2022-03-25 DIAGNOSIS — R600 Localized edema: Secondary | ICD-10-CM | POA: Diagnosis not present

## 2022-03-25 DIAGNOSIS — N182 Chronic kidney disease, stage 2 (mild): Secondary | ICD-10-CM | POA: Diagnosis not present

## 2022-03-25 DIAGNOSIS — D509 Iron deficiency anemia, unspecified: Secondary | ICD-10-CM

## 2022-03-25 DIAGNOSIS — Z79899 Other long term (current) drug therapy: Secondary | ICD-10-CM | POA: Diagnosis not present

## 2022-03-25 DIAGNOSIS — D472 Monoclonal gammopathy: Secondary | ICD-10-CM

## 2022-03-25 DIAGNOSIS — N189 Chronic kidney disease, unspecified: Secondary | ICD-10-CM | POA: Diagnosis not present

## 2022-03-25 DIAGNOSIS — N0421 Primary membranous nephropathy with nephrotic syndrome: Secondary | ICD-10-CM | POA: Diagnosis not present

## 2022-03-25 DIAGNOSIS — N2581 Secondary hyperparathyroidism of renal origin: Secondary | ICD-10-CM | POA: Diagnosis not present

## 2022-03-25 DIAGNOSIS — R809 Proteinuria, unspecified: Secondary | ICD-10-CM | POA: Diagnosis not present

## 2022-03-25 LAB — CBC
HCT: 32 % — ABNORMAL LOW (ref 39.0–52.0)
Hemoglobin: 10.2 g/dL — ABNORMAL LOW (ref 13.0–17.0)
MCH: 28.7 pg (ref 26.0–34.0)
MCHC: 31.9 g/dL (ref 30.0–36.0)
MCV: 90.1 fL (ref 80.0–100.0)
Platelets: 268 10*3/uL (ref 150–400)
RBC: 3.55 MIL/uL — ABNORMAL LOW (ref 4.22–5.81)
RDW: 15.8 % — ABNORMAL HIGH (ref 11.5–15.5)
WBC: 8.7 10*3/uL (ref 4.0–10.5)
nRBC: 0 % (ref 0.0–0.2)

## 2022-03-25 LAB — BASIC METABOLIC PANEL
Anion gap: 7 (ref 5–15)
BUN: 35 mg/dL — ABNORMAL HIGH (ref 8–23)
CO2: 27 mmol/L (ref 22–32)
Calcium: 8.2 mg/dL — ABNORMAL LOW (ref 8.9–10.3)
Chloride: 105 mmol/L (ref 98–111)
Creatinine, Ser: 1.23 mg/dL (ref 0.61–1.24)
GFR, Estimated: >60 mL/min (ref >60)
Glucose, Bld: 165 mg/dL — ABNORMAL HIGH (ref 70–99)
Potassium: 4.6 mmol/L (ref 3.5–5.1)
Sodium: 139 mmol/L (ref 135–145)

## 2022-03-25 LAB — IRON AND TIBC
Iron: 44 ug/dL — ABNORMAL LOW (ref 45–182)
Saturation Ratios: 16 % — ABNORMAL LOW (ref 17.9–39.5)
TIBC: 283 ug/dL (ref 250–450)
UIBC: 239 ug/dL

## 2022-03-25 LAB — FERRITIN: Ferritin: 115 ng/mL (ref 24–336)

## 2022-03-26 ENCOUNTER — Ambulatory Visit: Payer: Self-pay | Admitting: *Deleted

## 2022-03-26 LAB — IGG, IGA, IGM
IgA: 161 mg/dL (ref 61–437)
IgG (Immunoglobin G), Serum: 1197 mg/dL (ref 603–1613)
IgM (Immunoglobulin M), Srm: 82 mg/dL (ref 15–143)

## 2022-03-26 LAB — CYTOLOGY - NON PAP

## 2022-03-26 LAB — KAPPA/LAMBDA LIGHT CHAINS
Kappa free light chain: 78.6 mg/L — ABNORMAL HIGH (ref 3.3–19.4)
Kappa, lambda light chain ratio: 2.82 — ABNORMAL HIGH (ref 0.26–1.65)
Lambda free light chains: 27.9 mg/L — ABNORMAL HIGH (ref 5.7–26.3)

## 2022-03-26 NOTE — Patient Instructions (Signed)
Visit Information  Thank you for taking time to visit with me today. Please don't hesitate to contact me if I can be of assistance to you before our next scheduled telephone appointment.  Following are the goals we discussed today:  Continue monitoring weights daily, documenting trends to share with provider.   Attempt to restart cardiac rehab.  Our next appointment is by telephone on 2/20  Please call the care guide team at [phone] if you need to cancel or reschedule your appointment.   Please call the Suicide and Crisis Lifeline: 988 call the [place] National Suicide Prevention Lifeline: 905-132-2893 or TTY: 3303565417 TTY 430-406-6372) to talk to a trained counselor call 1-800-273-TALK (toll free, 24 hour hotline) call 911 if you are experiencing a Mental Health or [place] or need someone to talk to.  Patient verbalizes understanding of instructions and care plan provided today and agrees to view in [place]. Active MyChart status and patient understanding of how to access instructions and care plan via MyChart confirmed with patient.     The patient has been provided with contact information for the care management team and has been advised to call with any health related questions or concerns.   Valente David, RN, MSN, [place] [facility] Care Management Coordinator [phone]

## 2022-03-26 NOTE — Patient Outreach (Signed)
  Care Coordination   Follow Up Visit Note   03/26/2022 Name: [name] MRN: [id number] DOB: 11-May-1950  [name] is a 72 y.o. year old male who sees Juluis Pitch, MD for primary care. I spoke with  [name] by phone today.  What matters to the patients health and wellness today?  Maintaining breathing/shortness of breath.  Had thoracentesis on 1/9.    Goals Addressed             This Visit's Progress    Effective management of CHF   On track    Care Coordination Interventions: Basic overview and discussion of pathophysiology of Heart Failure reviewed Provided education on low sodium diet Reviewed Heart Failure Action Plan in depth and provided written copy Assessed need for readable accurate scales in home Provided education about placing scale on hard, flat surface Discussed importance of daily weight and advised patient to weigh and record daily Reviewed role of diuretics in prevention of fluid overload and management of heart failure; Discussed the importance of keeping all appointments with provider Report he has swelling, better.   Follows up with oncology on 1/16, Pulmonology on 2/6, nephrology on 2/7, vascular 2/22, PCP on 3/15, cardiology on 4/2 Now active with pulmonary/cardiac rehab, had not been in a while due to shortness of breath, will return tomorrow.         SDOH assessments and interventions completed:  No     Care Coordination Interventions:  Yes, provided   Follow up plan: Follow up call scheduled for 2/20    Encounter Outcome:  Pt. Visit Completed   Andrew David, RN, MSN, [place] [facility] Management Care Management Coordinator [phone]

## 2022-03-27 LAB — CHOLESTEROL, BODY FLUID: Cholesterol, Fluid: 14 mg/dL

## 2022-03-27 LAB — PROTEIN ELECTROPHORESIS, SERUM
A/G Ratio: 1 (ref 0.7–1.7)
Albumin ELP: 2.7 g/dL — ABNORMAL LOW (ref 2.9–4.4)
Alpha-1-Globulin: 0.2 g/dL (ref 0.0–0.4)
Alpha-2-Globulin: 0.7 g/dL (ref 0.4–1.0)
Beta Globulin: 0.8 g/dL (ref 0.7–1.3)
Gamma Globulin: 1.1 g/dL (ref 0.4–1.8)
Globulin, Total: 2.8 g/dL (ref 2.2–3.9)
Total Protein ELP: 5.5 g/dL — ABNORMAL LOW (ref 6.0–8.5)

## 2022-03-27 LAB — ACID FAST SMEAR (AFB, MYCOBACTERIA): Acid Fast Smear: NEGATIVE

## 2022-03-27 LAB — BODY FLUID CULTURE W GRAM STAIN: Culture: NO GROWTH

## 2022-03-30 ENCOUNTER — Encounter: Payer: Medicare HMO | Admitting: *Deleted

## 2022-03-31 ENCOUNTER — Inpatient Hospital Stay: Payer: Medicare HMO

## 2022-03-31 ENCOUNTER — Encounter: Payer: Self-pay | Admitting: Oncology

## 2022-03-31 ENCOUNTER — Ambulatory Visit: Payer: Medicare HMO

## 2022-03-31 ENCOUNTER — Ambulatory Visit: Payer: Medicare HMO | Admitting: Oncology

## 2022-03-31 ENCOUNTER — Other Ambulatory Visit: Payer: Self-pay | Admitting: Pulmonary Disease

## 2022-03-31 ENCOUNTER — Inpatient Hospital Stay: Payer: Medicare HMO | Admitting: Oncology

## 2022-03-31 VITALS — BP 126/65 | HR 88 | Temp 97.6°F | Resp 18

## 2022-03-31 DIAGNOSIS — D509 Iron deficiency anemia, unspecified: Secondary | ICD-10-CM | POA: Diagnosis not present

## 2022-03-31 DIAGNOSIS — J90 Pleural effusion, not elsewhere classified: Secondary | ICD-10-CM

## 2022-03-31 DIAGNOSIS — R809 Proteinuria, unspecified: Secondary | ICD-10-CM | POA: Diagnosis not present

## 2022-03-31 DIAGNOSIS — Z79899 Other long term (current) drug therapy: Secondary | ICD-10-CM | POA: Diagnosis not present

## 2022-03-31 MED ORDER — SODIUM CHLORIDE 0.9 % IV SOLN
200.0000 mg | Freq: Once | INTRAVENOUS | Status: AC
  Administered 2022-03-31: 200 mg via INTRAVENOUS
  Filled 2022-03-31: qty 200

## 2022-03-31 MED ORDER — SODIUM CHLORIDE 0.9 % IV SOLN
Freq: Once | INTRAVENOUS | Status: AC
  Filled 2022-03-31: qty 250

## 2022-03-31 NOTE — Progress Notes (Signed)
[place]  Telephone:([phone]) [phone] Fax:(336) [phone]  ID: [name] OB: March 20, 1950  MR#: [id number]  CSN#:[id number]  Patient Care Team: Juluis Pitch, MD as PCP - General (Family Medicine) Efrain Sella, MD as Consulting Physician (Gastroenterology) Lloyd Huger, MD as Consulting Physician (Oncology) Valente David, RN as [facility] Management  CHIEF COMPLAINT: Iron deficiency anemia, MGUS, nephrotic range proteinuria.  INTERVAL HISTORY: Patient returns to clinic today for repeat laboratory work, further evaluation, additional IV Venofer, and discussion of initiating Rituxan for his nephrotic range proteinuria.  He continues to have chronic weakness and fatigue, but otherwise feels well.  He has no neurologic complaints.  He denies any fevers.  He has a good appetite and denies weight loss.  He has no chest pain, shortness of breath, cough, or hemoptysis.  He denies any nausea, vomiting, constipation, or diarrhea.  He has no melena or hematochezia.  He has no urinary complaints.  Patient offers no further specific complaints today.  REVIEW OF SYSTEMS:   Review of Systems  Constitutional:  Positive for malaise/fatigue. Negative for fever and weight loss.  Respiratory: Negative.  Negative for cough, hemoptysis and shortness of breath.   Cardiovascular:  Positive for leg swelling. Negative for chest pain.  Gastrointestinal: Negative.  Negative for abdominal pain, blood in stool and melena.  Genitourinary: Negative.  Negative for hematuria.  Musculoskeletal: Negative.  Negative for back pain.  Skin: Negative.  Negative for rash.  Neurological:  Positive for weakness. Negative for dizziness, focal weakness and headaches.  Psychiatric/Behavioral: Negative.  The patient is not nervous/anxious.     As per HPI. Otherwise, a complete review of systems is negative.  PAST MEDICAL HISTORY: Past Medical History:  Diagnosis Date    Arthritis    CHF (congestive heart failure), NYHA class II, acute on chronic, systolic (HCC)    EF 17% 06/9447   Coronary artery disease    [facility]   Diabetes mellitus without complication ([place])    Dysplastic nevus 08/26/2020   L flank, moderate atypia   Hyperlipidemia    Hypertension    Melanoma ([place]) 08/20/2015   Right neck. Superficial spreading, arising in nevus. Tumor thickness 1.30m, Anatomic level IV   [place] 05/22[place]   R lateral neck ant to scar, exc 05/22/20   Numbness in right leg    due to back   Skin cancer    removed l arm   Spinal stenosis    Stented coronary artery 2000   X 1 STENT   Swelling of both lower extremities     PAST SURGICAL HISTORY: Past Surgical History:  Procedure Laterality Date   CATARACT EXTRACTION W/PHACO Right 05/03/2019   Procedure: CATARACT EXTRACTION PHACO AND INTRAOCULAR LENS PLACEMENT (IOC) RIGHT DIABETIC 6.44 00:58.4 11.0%;  Surgeon: BLeandrew Koyanagi MD;  [facility]  Service: Ophthalmology;  Laterality: Right;  DIABETIC   CATARACT EXTRACTION W/PHACO Left 06/28/2019   Procedure: CATARACT EXTRACTION PHACO AND INTRAOCULAR LENS PLACEMENT (IOC) LEFT DIABETIC 7.99  01:13.4  10.9% ;  Surgeon: BLeandrew Koyanagi MD;  [facility]  Service: Ophthalmology;  Laterality: Left;  Diabetic - insulin and oral meds   CHOLECYSTECTOMY     JOINT REPLACEMENT     RT KNEE   LUMBAR LAMINECTOMY/DECOMPRESSION MICRODISCECTOMY  04/13/2012   Procedure: LUMBAR LAMINECTOMY/DECOMPRESSION MICRODISCECTOMY 1 LEVEL;  Surgeon: JJohnn Hai MD;  Location: [facility];  Service: Orthopedics;  Laterality: Bilateral;  L4-L5   UVULECTOMY  1990's    FAMILY HISTORY: History reviewed. No pertinent family history.  ADVANCED DIRECTIVES (Y/N):  N  HEALTH MAINTENANCE: Social History   Tobacco Use   Smoking status: Never   Smokeless tobacco: Never  Vaping Use   Vaping Use: Never used  Substance  Use Topics   Alcohol use: Yes    Comment: rare   Drug use: No     Colonoscopy:  PAP:  Bone density:  Lipid panel:  Allergies  Allergen Reactions   Metoprolol Other (See Comments)    Cant remember  Cant remember  Cant remember  Cant remember   Toprol Xl [Metoprolol Tartrate] Other (See Comments)    Cant remember   Buprenorphine Itching and Rash   Buprenorphine Hcl Itching and Rash   Morphine Itching and Other (See Comments)   Morphine And Related Itching, Rash and Other (See Comments)   Triamcinolone Rash   Triamcinolone Acetonide Rash    Current Outpatient Medications  Medication Sig Dispense Refill   amLODipine (NORVASC) 5 MG tablet Take 5 mg by mouth daily.     aspirin 325 MG tablet Take 1 tablet (325 mg total) by mouth daily.     atorvastatin (LIPITOR) 80 MG tablet Take 80 mg by mouth daily.     Continuous Blood Gluc Receiver (DEXCOM G6 RECEIVER) DEVI Use to monitor blood sugar. [place] [phone]     Continuous Blood Gluc Sensor (DEXCOM G6 SENSOR) MISC Use to monitor blood sugar.  Replace every 10 days. [place] [phone].     Continuous Blood Gluc Transmit (DEXCOM G6 TRANSMITTER) MISC Use to monitor blood sugar.  Replace every 3 months. [place] [phone].     FEROSUL 325 (65 Fe) MG tablet Take 1 tablet (325 mg total) by mouth 2 (two) times daily. 180 tablet 1   ferrous sulfate (FEROSUL) 325 (65 FE) MG tablet Take by mouth.     furosemide (LASIX) 20 MG tablet Take by mouth.     furosemide (LASIX) 80 MG tablet Take by mouth.     glipiZIDE (GLUCOTROL XL) 5 MG 24 hr tablet      insulin aspart (NOVOLOG) 100 UNIT/ML injection Inject into the skin 3 (three) times daily before meals.     insulin glargine (LANTUS) 100 UNIT/ML injection Inject 34 Units into the skin daily.     Insulin Pen Needle (COMFORT EZ PEN NEEDLES) 32G X 8 MM MISC See admin instructions.     isosorbide mononitrate (IMDUR) 30 MG 24 hr tablet Take by mouth.     JARDIANCE 10 MG TABS tablet      LANTUS  SOLOSTAR 100 UNIT/ML Solostar Pen SMARTSIG:44 Unit(s) SUB-Q Every Night     losartan (COZAAR) 100 MG tablet      NOVOLOG FLEXPEN 100 UNIT/ML FlexPen INJ UP TO 50 UNI D [place] IN MULTIPLE INJECTIONS B MEALS UTD     spironolactone (ALDACTONE) 25 MG tablet      valsartan (DIOVAN) 320 MG tablet      urea (CARMOL) 10 % cream Apply topically. (Patient not taking: Reported on 02/19/2022)     No current facility-administered medications for this visit.    OBJECTIVE: Vitals:   03/31/22 0853  BP: 126/65  Pulse: 88  Resp: 18  Temp: 97.6 F (36.4 C)  SpO2: 97%     There is no height or weight on file to calculate BMI.    ECOG FS:0 - Asymptomatic  General: Well-developed, well-nourished, no acute distress.  Sitting in a wheelchair. Eyes: Pink conjunctiva, anicteric sclera.  HEENT: Normocephalic, moist mucous membranes. Lungs: No audible wheezing or coughing. Heart: Regular rate and rhythm. Abdomen: Soft, nontender, no obvious distention. Musculoskeletal: No edema, cyanosis, or clubbing. Neuro: Alert, answering all questions appropriately. Cranial nerves grossly intact. Skin: No rashes or petechiae noted. Psych: Normal affect.  LAB RESULTS:  Lab Results  Component Value Date   NA 139 03/25/2022   K 4.6 03/25/2022   CL 105 03/25/2022   CO2 27 03/25/2022   GLUCOSE 165 (H) 03/25/2022   BUN 35 (H) 03/25/2022   CREATININE 1.23 03/25/2022   CALCIUM 8.2 (L) 03/25/2022   PROT 7.1 06/02/2021   ALBUMIN 3.5 06/02/2021   AST 18 06/02/2021   ALT 20 06/02/2021   ALKPHOS 94 06/02/2021   BILITOT 1.3 (H) 06/02/2021   GFRNONAA >60 03/25/2022   GFRAA >60 10/15/2019    Lab Results  Component Value Date   WBC 8.7 03/25/2022   NEUTROABS 5.1 02/19/2022   HGB 10.2 (L) 03/25/2022   HCT 32.0 (L) 03/25/2022   MCV 90.1 03/25/2022   PLT 268 03/25/2022   Lab Results  Component Value Date   IRON 44 (L) 03/25/2022   TIBC 283 03/25/2022   IRONPCTSAT 16 (L) 03/25/2022   Lab Results  Component Value  Date   FERRITIN 115 03/25/2022     STUDIES: DG Chest Port 1 View  Result Date: 03/24/2022 CLINICAL DATA:  Post thoracentesis EXAM: PORTABLE CHEST 1 VIEW COMPARISON:  Chest XR, earlier same day and 03/13/2022. CT abdomen pelvis, 02/19/2022. FINDINGS: LEFT cardiac border is obscured. Aortic arch atherosclerosis. The RIGHT lung is well inflated. Interval decrease of prior large LEFT pleural effusion with small-to-moderate volume residual. LEFT basilar consolidative opacity, likely atelectasis. No pneumothorax. No interval osseous abnormality. IMPRESSION: 1. Post thoracentesis with volume residual LEFT pleural effusion. No pneumothorax. 2. Consolidative LEFT basilar opacity, likely atelectasis. Electronically Signed   By: Michaelle Birks M.D.   On: 03/24/2022 15:45   US THORACENTESIS ASP PLEURAL SPACE W/IMG GUIDE  Result Date: 03/24/2022 INDICATION: Patient with complaint of dyspnea x3 months. During pulmonary visit today, patient was found to have large left pleural effusion. Patient was referred to IR for diagnostic and therapeutic LEFT thoracentesis. EXAM: ULTRASOUND GUIDED DIAGNOSTIC AND THERAPEUTIC LEFT THORACENTESIS MEDICATIONS: 10 mL 1 % lidocaine COMPLICATIONS: None immediate. PROCEDURE: An ultrasound guided thoracentesis was thoroughly discussed with the patient and questions answered. The benefits, risks, alternatives and complications were also discussed. The patient understands and wishes to proceed with the procedure. Written consent was obtained. Ultrasound was performed to localize and mark an adequate pocket of fluid in the LEFT chest. The area was then prepped and draped in the normal sterile fashion. 1% Lidocaine was used for local anesthesia. Under ultrasound guidance a 6 Fr Safe-T-Centesis catheter was introduced. Thoracentesis was performed. The catheter was removed and a dressing applied. FINDINGS: A total of approximately 1.6 L of clear, yellow fluid was removed. Samples were sent to the  laboratory as requested by the clinical team. IMPRESSION: Successful diagnostic and therapeutic LEFT thoracentesis yielding 1.6 L of pleural fluid. Read by: Narda Rutherford, AGNP-BC Electronically Signed   By: Michaelle Birks M.D.   On: 03/24/2022 15:31    ASSESSMENT: Iron deficiency anemia, MGUS.  PLAN:   1.  Iron deficiency anemia: Patient's hemoglobin remains mildly decreased at 10.2 and is noted to have a decreased total iron as well as a decreased iron saturation ratio.  Previously, all of his other laboratory work other is either negative or within normal limits.  Proceed with 200 mg IV Venofer today.  Patient will return to clinic in 1 and 3 weeks to receive additional Venofer along with his Rituxan.  Return to clinic in 4 months with repeat laboratory work and further evaluation.   2.  MGUS: Resolved.  Most recent M spike was not observed.   3.  Nephrotic range proteinuria: Case discussed with nephrology.  Patient will return to clinic in 1 week to initiate 1000 mg of Rituxan.  He would then receive a second dose 2 weeks later.  Methylprednisone has not been ordered as a premed secondary to patient's underlying diabetes.  Venofer and follow-up in clinic as above.   Patient expressed understanding and was in agreement with this plan. He also understands that He can call clinic at any time with any questions, concerns, or complaints.    Lloyd Huger, MD   03/31/2022 10:36 AM

## 2022-03-31 NOTE — Patient Instructions (Signed)
Iron Sucrose Injection What is this medication? IRON SUCROSE (EYE ern SOO krose) treats low levels of iron (iron deficiency anemia) in people with kidney disease. Iron is a mineral that plays an important role in making red blood cells, which carry oxygen from your lungs to the rest of your body. This medicine may be used for other purposes; ask your health care provider or pharmacist if you have questions. COMMON BRAND NAME(S): Venofer What should I tell my care team before I take this medication? They need to know if you have any of these conditions: Anemia not caused by low iron levels Heart disease High levels of iron in the blood Kidney disease Liver disease An unusual or allergic reaction to iron, other medications, foods, dyes, or preservatives Pregnant or trying to get pregnant Breastfeeding How should I use this medication? This medication is for infusion into a vein. It is given in a hospital or clinic setting. Talk to your care team about the use of this medication in children. While this medication may be prescribed for children as young as 2 years for selected conditions, precautions do apply. Overdosage: If you think you have taken too much of this medicine contact a poison control center or emergency room at once. NOTE: This medicine is only for you. Do not share this medicine with others. What if I miss a dose? Keep appointments for follow-up doses. It is important not to miss your dose. Call your care team if you are unable to keep an appointment. What may interact with this medication? Do not take this medication with any of the following: Deferoxamine Dimercaprol Other iron products This medication may also interact with the following: Chloramphenicol Deferasirox This list may not describe all possible interactions. Give your health care provider a list of all the medicines, herbs, non-prescription drugs, or dietary supplements you use. Also tell them if you smoke,  drink alcohol, or use illegal drugs. Some items may interact with your medicine. What should I watch for while using this medication? Visit your care team regularly. Tell your care team if your symptoms do not start to get better or if they get worse. You may need blood work done while you are taking this medication. You may need to follow a special diet. Talk to your care team. Foods that contain iron include: whole grains/cereals, dried fruits, beans, or peas, leafy green vegetables, and organ meats (liver, kidney). What side effects may I notice from receiving this medication? Side effects that you should report to your care team as soon as possible: Allergic reactions--skin rash, itching, hives, swelling of the face, lips, tongue, or throat Low blood pressure--dizziness, feeling faint or lightheaded, blurry vision Shortness of breath Side effects that usually do not require medical attention (report to your care team if they continue or are bothersome): Flushing Headache Joint pain Muscle pain Nausea Pain, redness, or irritation at injection site This list may not describe all possible side effects. Call your doctor for medical advice about side effects. You may report side effects to FDA at 1-800-FDA-1088. Where should I keep my medication? This medication is given in a hospital or clinic and will not be stored at home. NOTE: This sheet is a summary. It may not cover all possible information. If you have questions about this medicine, talk to your doctor, pharmacist, or health care provider.  2023 Elsevier/Gold Standard (2020-06-13 00:00:00)

## 2022-03-31 NOTE — Progress Notes (Signed)
Pt has weakness and dyspnea with any exertion. Pt is here expecting an infusion from his kidney MD. I see Venofer and 6 hours time slot.

## 2022-04-01 ENCOUNTER — Encounter: Payer: Medicare HMO | Admitting: *Deleted

## 2022-04-01 ENCOUNTER — Other Ambulatory Visit: Payer: Self-pay | Admitting: Pulmonary Disease

## 2022-04-01 DIAGNOSIS — J90 Pleural effusion, not elsewhere classified: Secondary | ICD-10-CM

## 2022-04-02 ENCOUNTER — Other Ambulatory Visit: Payer: Self-pay | Admitting: Radiology

## 2022-04-02 ENCOUNTER — Ambulatory Visit
Admission: RE | Admit: 2022-04-02 | Discharge: 2022-04-02 | Disposition: A | Discharge status: Home / Self Care | Payer: Medicare HMO | Source: Ambulatory Visit | Attending: Radiology | Admitting: Radiology

## 2022-04-02 ENCOUNTER — Ambulatory Visit
Admission: RE | Admit: 2022-04-02 | Discharge: 2022-04-02 | Disposition: A | Discharge status: Home / Self Care | Payer: Medicare HMO | Source: Ambulatory Visit | Attending: Pulmonary Disease | Admitting: Pulmonary Disease

## 2022-04-02 DIAGNOSIS — R0602 Shortness of breath: Secondary | ICD-10-CM | POA: Diagnosis not present

## 2022-04-02 DIAGNOSIS — Z9889 Other specified postprocedural states: Secondary | ICD-10-CM

## 2022-04-02 DIAGNOSIS — J90 Pleural effusion, not elsewhere classified: Secondary | ICD-10-CM | POA: Diagnosis not present

## 2022-04-02 LAB — PROTEIN, PLEURAL OR PERITONEAL FLUID: Total protein, fluid: <3 g/dL

## 2022-04-02 LAB — BODY FLUID CELL COUNT WITH DIFFERENTIAL
Eos, Fluid: 2 %
Lymphs, Fluid: 44 %
Monocyte-Macrophage-Serous Fluid: 25 % — ABNORMAL LOW (ref 50–90)
Neutrophil Count, Fluid: 28 % — ABNORMAL HIGH (ref 0–25)
Other Cells, Fluid: 1 %
Total Nucleated Cell Count, Fluid: 297 cu mm (ref 0–1000)

## 2022-04-02 LAB — LACTATE DEHYDROGENASE, PLEURAL OR PERITONEAL FLUID: LD, Fluid: 54 U/L — ABNORMAL HIGH (ref 3–23)

## 2022-04-02 LAB — GLUCOSE, PLEURAL OR PERITONEAL FLUID: Glucose, Fluid: 145 mg/dL

## 2022-04-02 LAB — AMYLASE, PLEURAL OR PERITONEAL FLUID: Amylase, Fluid: 18 U/L

## 2022-04-02 MED ORDER — LIDOCAINE HCL (PF) 1 % IJ SOLN
20.0000 mL | Freq: Once | INTRAMUSCULAR | Status: AC
  Administered 2022-04-02: 20 mL via INTRADERMAL

## 2022-04-02 NOTE — Discharge Instructions (Signed)
Instructions reviewed with patient; pt understands

## 2022-04-02 NOTE — Procedures (Signed)
PROCEDURE SUMMARY:  Successful US guided left thoracentesis. Yielded 1.5 L of clear yellow colored fluid. Pt tolerated procedure well. No immediate complications.  Specimen was sent for labs. CXR ordered.  EBL < 1 mL  Tsosie Billing D PA-C 04/02/2022 9:08 AM

## 2022-04-03 LAB — ACID FAST SMEAR (AFB, MYCOBACTERIA): Acid Fast Smear: NEGATIVE

## 2022-04-03 LAB — CYTOLOGY - NON PAP

## 2022-04-05 LAB — CHOLESTEROL, BODY FLUID: Cholesterol, Fluid: 20 mg/dL

## 2022-04-06 ENCOUNTER — Telehealth: Payer: Self-pay

## 2022-04-06 LAB — BODY FLUID CULTURE W GRAM STAIN
Culture: NO GROWTH
Gram Stain: NONE SEEN

## 2022-04-06 NOTE — Telephone Encounter (Signed)
Spoke with patient who states he is feeling much better. Per Dr. Lanney Gins, would like to see patient in follow up first before returning back with rehab. Appt is on 1/26 and patient will call us if anything changes or if MD says otherwise. Patient in agreement.

## 2022-04-08 ENCOUNTER — Ambulatory Visit
Admission: RE | Admit: 2022-04-08 | Discharge: 2022-04-08 | Disposition: A | Discharge status: Home / Self Care | Payer: Medicare HMO | Source: Ambulatory Visit | Attending: Pulmonary Disease | Admitting: Pulmonary Disease

## 2022-04-08 ENCOUNTER — Encounter: Payer: Self-pay | Admitting: *Deleted

## 2022-04-08 DIAGNOSIS — J90 Pleural effusion, not elsewhere classified: Secondary | ICD-10-CM | POA: Insufficient documentation

## 2022-04-08 DIAGNOSIS — K7460 Unspecified cirrhosis of liver: Secondary | ICD-10-CM | POA: Diagnosis not present

## 2022-04-08 DIAGNOSIS — I5022 Chronic systolic (congestive) heart failure: Secondary | ICD-10-CM

## 2022-04-08 NOTE — Progress Notes (Signed)
Pulmonary Individual Treatment Plan  Patient Details  Name: [name] MRN: [id number] Date of Birth: 12/03/1950 Referring Provider:   Flowsheet Row Pulmonary Rehab from 02/02/2022 in [facility] Cardiac and Pulmonary Rehab  Referring Provider [name]       Initial Encounter Date:  Flowsheet Row Pulmonary Rehab from 02/02/2022 in [facility] Cardiac and Pulmonary Rehab  Date 02/02/22       Visit Diagnosis: Heart failure, chronic systolic ([place])  Patient's Home Medications on Admission:  Current Outpatient Medications:    amLODipine (NORVASC) 5 MG tablet, Take 5 mg by mouth daily., Disp: , Rfl:    aspirin 325 MG tablet, Take 1 tablet (325 mg total) by mouth daily., Disp: , Rfl:    atorvastatin (LIPITOR) 80 MG tablet, Take 80 mg by mouth daily., Disp: , Rfl:    Continuous Blood Gluc Receiver ([place]) DEVI, Use to monitor blood sugar. [place] [phone], Disp: , Rfl:    Continuous Blood Gluc Sensor (DEXCOM G6 SENSOR) MISC, Use to monitor blood sugar.  Replace every 10 days. [place] [id number]., Disp: , Rfl:    Continuous Blood Gluc Transmit (DEXCOM G6 TRANSMITTER) MISC, Use to monitor blood sugar.  Replace every 3 months. [place] [id number]., Disp: , Rfl:    FEROSUL 325 (65 Fe) MG tablet, Take 1 tablet (325 mg total) by mouth 2 (two) times daily., Disp: 180 tablet, Rfl: 1   ferrous sulfate (FEROSUL) 325 (65 FE) MG tablet, Take by mouth., Disp: , Rfl:    furosemide (LASIX) 20 MG tablet, Take by mouth., Disp: , Rfl:    furosemide (LASIX) 80 MG tablet, Take by mouth., Disp: , Rfl:    glipiZIDE (GLUCOTROL XL) 5 MG 24 hr tablet, , Disp: , Rfl:    insulin aspart (NOVOLOG) 100 UNIT/ML injection, Inject into the skin 3 (three) times daily before meals., Disp: , Rfl:    insulin glargine (LANTUS) 100 UNIT/ML injection, Inject 34 Units into the skin daily., Disp: , Rfl:    Insulin Pen Needle (COMFORT EZ PEN NEEDLES) 32G X 8 MM MISC, See admin instructions., Disp: , Rfl:    isosorbide  mononitrate (IMDUR) 30 MG 24 hr tablet, Take by mouth., Disp: , Rfl:    JARDIANCE 10 MG TABS tablet, , Disp: , Rfl:    LANTUS SOLOSTAR 100 UNIT/ML Solostar Pen, SMARTSIG:44 Unit(s) SUB-Q Every Night, Disp: , Rfl:    losartan (COZAAR) 100 MG tablet, , Disp: , Rfl:    NOVOLOG FLEXPEN 100 UNIT/ML FlexPen, INJ UP TO 50 UNI D [place] IN MULTIPLE INJECTIONS B MEALS UTD, Disp: , Rfl:    spironolactone (ALDACTONE) 25 MG tablet, , Disp: , Rfl:    urea (CARMOL) 10 % cream, Apply topically. (Patient not taking: Reported on 02/19/2022), Disp: , Rfl:    valsartan (DIOVAN) 320 MG tablet, , Disp: , Rfl:   Past Medical History: Past Medical History:  Diagnosis Date   Arthritis    CHF (congestive heart failure), NYHA class II, acute on chronic, systolic (HCC)    EF 79% 06/8014   Coronary artery disease    [facility]   Diabetes mellitus without complication ([place])    Dysplastic nevus 08/26/2020   L flank, moderate atypia   Hyperlipidemia    Hypertension    Melanoma ([place]) 08/20/2015   Right neck. Superficial spreading, arising in nevus. Tumor thickness 1.35m, Anatomic level IV   Melanoma in situ ([place] 05/22/2020   R lateral neck ant to scar, exc 05/22/20   Numbness in right  leg    due to back   Skin cancer    removed l arm   Spinal stenosis    Stented coronary artery 2000   X 1 STENT   Swelling of both lower extremities     Tobacco Use: Social History   Tobacco Use  Smoking Status Never  Smokeless Tobacco Never    Labs: Review Flowsheet       Latest Ref Rng & Units 09/01/2006 03/31/2011 04/13/2012  Labs for ITP Cardiac and Pulmonary Rehab  Hemoglobin A1c <5.7 % 9.6 (NOTE)   The ADA recommends the following therapeutic goals for glycemic   control related to Hgb A1C measurement:   Goal of Therapy:   < 7.0% Hgb A1C   Action Suggested:  > 8.0% Hgb A1C   Ref:  Diabetes Care, 22, Suppl. 1, 1999  11.2  8.1      Pulmonary Assessment Scores:  Pulmonary Assessment Scores     Row  Name 02/02/22 1722         ADL UCSD   ADL Phase Entry     SOB Score total 58     Rest 0     Walk 2     Stairs 5     Bath 1     Dress 2     Shop 1       CAT Score   CAT Score 25       mMRC Score   mMRC Score 4              UCSD: Self-administered rating of dyspnea associated with activities of daily living (ADLs) 6-point scale (0 = "not at all" to 5 = "maximal or unable to do because of breathlessness")  Scoring Scores range from 0 to 120.  Minimally important difference is 5 units  CAT: CAT can identify the health impairment of COPD patients and is better correlated with disease progression.  CAT has a scoring range of zero to 40. The CAT score is classified into four groups of low (less than 10), medium (10 - 20), high (21-30) and very high (31-40) based on the impact level of disease on health status. A CAT score over 10 suggests significant symptoms.  A worsening CAT score could be explained by an exacerbation, poor medication adherence, poor inhaler technique, or progression of COPD or comorbid conditions.  CAT MCID is 2 points  mMRC: mMRC (Modified Medical Research Council) Dyspnea Scale is used to assess the degree of baseline functional disability in patients of respiratory disease due to dyspnea. No minimal important difference is established. A decrease in score of 1 point or greater is considered a positive change.   Pulmonary Function Assessment:  Pulmonary Function Assessment - 01/26/22 1111       Breath   Shortness of Breath Yes;Limiting activity             Exercise Target Goals: Exercise Program Goal: Individual exercise prescription set using results from initial 6 min walk test and THRR while considering  patient's activity barriers and safety.   Exercise Prescription Goal: Initial exercise prescription builds to 30-45 minutes a day of aerobic activity, 2-3 days per week.  Home exercise guidelines will be given to patient during program as  part of exercise prescription that the participant will acknowledge.  Education: Aerobic Exercise: - Group verbal and visual presentation on the components of exercise prescription. Introduces F.I.T.T principle from ACSM for exercise prescriptions.  Reviews F.I.T.T. principles of aerobic exercise including progression.  Written material given at graduation.   Education: Resistance Exercise: - Group verbal and visual presentation on the components of exercise prescription. Introduces F.I.T.T principle from ACSM for exercise prescriptions  Reviews F.I.T.T. principles of resistance exercise including progression. Written material given at graduation.    Education: Exercise & Equipment Safety: - Individual verbal instruction and demonstration of equipment use and safety with use of the equipment. Flowsheet Row Pulmonary Rehab from 02/25/2022 in [facility] Cardiac and Pulmonary Rehab  Date 02/02/22  Educator [facility]  Instruction Review Code 1- Verbalizes Understanding       Education: Exercise Physiology & General Exercise Guidelines: - Group verbal and written instruction with models to review the exercise physiology of the cardiovascular system and associated critical values. Provides general exercise guidelines with specific guidelines to those with heart or lung disease.    Education: Flexibility, Balance, Mind/Body Relaxation: - Group verbal and visual presentation with interactive activity on the components of exercise prescription. Introduces F.I.T.T principle from ACSM for exercise prescriptions. Reviews F.I.T.T. principles of flexibility and balance exercise training including progression. Also discusses the mind body connection.  Reviews various relaxation techniques to help reduce and manage stress (i.e. Deep breathing, progressive muscle relaxation, and visualization). Balance handout provided to take home. Written material given at graduation.   Activity Barriers & Risk Stratification:   6  Minute Walk:  6 Minute Walk     Row Name 02/02/22 1701         6 Minute Walk   Phase Initial     Distance 480 feet     Walk Time 5.94 minutes     # of Rest Breaks 2     MPH 0.91     METS 0.8     RPE 15     Perceived Dyspnea  3     VO2 Peak 3     Symptoms No     Resting HR 96 bpm     Resting BP 158/90     Resting Oxygen Saturation  94 %     Exercise Oxygen Saturation  during 6 min walk 90 %     Max Ex. HR 120 bpm     Max Ex. BP 160/80     2 Minute Post BP 140/70       Interval HR   1 Minute HR 98     2 Minute HR 110     3 Minute HR 112     4 Minute HR 118     5 Minute HR 119     6 Minute HR 120     2 Minute Post HR 100     Interval Heart Rate? Yes       Interval Oxygen   Interval Oxygen? Yes     Baseline Oxygen Saturation % 94 %     1 Minute Oxygen Saturation % 90 %     1 Minute Liters of Oxygen 0 L     2 Minute Oxygen Saturation % 90 %     2 Minute Liters of Oxygen 0 L     3 Minute Oxygen Saturation % 91 %     3 Minute Liters of Oxygen 0 L     4 Minute Oxygen Saturation % 92 %     4 Minute Liters of Oxygen 0 L     5 Minute Oxygen Saturation % 92 %     5 Minute Liters of Oxygen 0 L     6 Minute Oxygen Saturation %  93 %     6 Minute Liters of Oxygen 0 L     2 Minute Post Oxygen Saturation % 98 %     2 Minute Post Liters of Oxygen 0 L             Oxygen Initial Assessment:  Oxygen Initial Assessment - 02/02/22 1721       Home Oxygen   Home Oxygen Device None    Sleep Oxygen Prescription None    Home Exercise Oxygen Prescription None    Home Resting Oxygen Prescription None      Initial 6 min Walk   Oxygen Used None      Program Oxygen Prescription   Program Oxygen Prescription None      Intervention   Short Term Goals To learn and understand importance of maintaining oxygen saturations>88%;To learn and understand importance of monitoring SPO2 with pulse oximeter and demonstrate accurate use of the pulse oximeter.;To learn and demonstrate proper  pursed lip breathing techniques or other breathing techniques. ;To learn and exhibit compliance with exercise, home and travel O2 prescription    Long  Term Goals Verbalizes importance of monitoring SPO2 with pulse oximeter and return demonstration;Exhibits proper breathing techniques, such as pursed lip breathing or other method taught during program session;Maintenance of O2 saturations>88%;Exhibits compliance with exercise, home  and travel O2 prescription             Oxygen Re-Evaluation:  Oxygen Re-Evaluation     Row Name 02/16/22 1023 02/27/22 0929           Program Oxygen Prescription   Program Oxygen Prescription -- None        Home Oxygen   Home Oxygen Device -- None      Sleep Oxygen Prescription -- None      Home Exercise Oxygen Prescription -- None      Home Resting Oxygen Prescription -- None        Goals/Expected Outcomes   Short Term Goals -- To learn and understand importance of monitoring SPO2 with pulse oximeter and demonstrate accurate use of the pulse oximeter.;To learn and understand importance of maintaining oxygen saturations>88%      Long  Term Goals -- Maintenance of O2 saturations>88%;Verbalizes importance of monitoring SPO2 with pulse oximeter and return demonstration      Comments Reviewed PLB technique with pt.  Talked about how it works and it's importance in maintaining their exercise saturations. He has a pulse oximeter to check his oxygen saturation at home. Informed and explained why it is important to have one. Reviewed that oxygen saturations should be 88 percent and above. Patient verbalizes understanding.      Goals/Expected Outcomes Short: Become more profiecient at using PLB. Long: Become independent at using PLB. Short: monitor oxygen at home with exertion. Long: maintain oxygen saturations above 88 percent independently.               Oxygen Discharge (Final Oxygen Re-Evaluation):  Oxygen Re-Evaluation - 02/27/22 0929       Program  Oxygen Prescription   Program Oxygen Prescription None      Home Oxygen   Home Oxygen Device None    Sleep Oxygen Prescription None    Home Exercise Oxygen Prescription None    Home Resting Oxygen Prescription None      Goals/Expected Outcomes   Short Term Goals To learn and understand importance of monitoring SPO2 with pulse oximeter and demonstrate accurate use of the pulse oximeter.;To learn and understand importance  of maintaining oxygen saturations>88%    Long  Term Goals Maintenance of O2 saturations>88%;Verbalizes importance of monitoring SPO2 with pulse oximeter and return demonstration    Comments He has a pulse oximeter to check his oxygen saturation at home. Informed and explained why it is important to have one. Reviewed that oxygen saturations should be 88 percent and above. Patient verbalizes understanding.    Goals/Expected Outcomes Short: monitor oxygen at home with exertion. Long: maintain oxygen saturations above 88 percent independently.             Initial Exercise Prescription:  Initial Exercise Prescription - 02/02/22 1700       Date of Initial Exercise RX and Referring Provider   Date 02/02/22    Referring Provider [name]      Oxygen   Maintain Oxygen Saturation 88% or higher      Treadmill   MPH 0.8    Grade 0    Minutes 15    METs 1.6      NuStep   Level 1    SPM 80    Minutes 15    METs 1      REL-XR   Level 1    Speed 50    Minutes 15    METs 1      T5 Nustep   Level 1    SPM 80    Minutes 15    METs 1      Biostep-RELP   Level 1    SPM 50    Minutes 15    METs 1      Prescription Details   Frequency (times per week) 3    Duration Progress to 30 minutes of continuous aerobic without signs/symptoms of physical distress      Intensity   THRR 40-80% of Max Heartrate 116-136    Ratings of Perceived Exertion 11-13    Perceived Dyspnea 0-4      Progression   Progression Continue to progress workloads to maintain intensity  without signs/symptoms of physical distress.      Resistance Training   Training Prescription Yes    Weight 2    Reps 10-15             Perform Capillary Blood Glucose checks as needed.  Exercise Prescription Changes:   Exercise Prescription Changes     Row Name 02/02/22 1700 03/02/22 1400           Response to Exercise   Blood Pressure (Admit) 158/90 142/72      Blood Pressure (Exercise) 160/80 128/60      Blood Pressure (Exit) 140/70 128/70      Heart Rate (Admit) 96 bpm 95 bpm      Heart Rate (Exercise) 120 bpm 115 bpm      Heart Rate (Exit) 100 bpm 92 bpm      Oxygen Saturation (Admit) 94 % 98 %      Oxygen Saturation (Exercise) 90 % 90 %      Oxygen Saturation (Exit) 98 % 94 %      Rating of Perceived Exertion (Exercise) 15 13      Perceived Dyspnea (Exercise) 3 3      Symptoms none SOB      Comments 6 MWT results 3rd full day of exercise      Duration -- Progress to 30 minutes of  aerobic without signs/symptoms of physical distress      Intensity -- THRR unchanged  Progression   Progression -- Continue to progress workloads to maintain intensity without signs/symptoms of physical distress.      Average METs -- 2.18        Resistance Training   Training Prescription -- Yes      Weight -- 2 lb      Reps -- 10-15        Interval Training   Interval Training -- No        Recumbant Bike   Level -- 1      Minutes -- 15      METs -- 1.9        NuStep   Level -- 3      Minutes -- 30      METs -- 2.3        T5 Nustep   Level -- 3      Minutes -- 30      METs -- 1.9        Oxygen   Maintain Oxygen Saturation -- 88% or higher               Exercise Comments:   Exercise Comments     Row Name 02/16/22 1022           Exercise Comments First full day of exercise!  Patient was oriented to gym and equipment including functions, settings, policies, and procedures.  Patient's individual exercise prescription and treatment plan were  reviewed.  All starting workloads were established based on the results of the 6 minute walk test done at initial orientation visit.  The plan for exercise progression was also introduced and progression will be customized based on patient's performance and goals.                Exercise Goals and Review:   Exercise Goals     Row Name 02/02/22 1713             Exercise Goals   Increase Physical Activity Yes       Intervention Provide advice, education, support and counseling about physical activity/exercise needs.;Develop an individualized exercise prescription for aerobic and resistive training based on initial evaluation findings, risk stratification, comorbidities and participant's personal goals.       Expected Outcomes Short Term: Attend rehab on a regular basis to increase amount of physical activity.;Long Term: Add in home exercise to make exercise part of routine and to increase amount of physical activity.;Long Term: Exercising regularly at least 3-5 days a week.       Increase Strength and Stamina Yes       Intervention Provide advice, education, support and counseling about physical activity/exercise needs.;Develop an individualized exercise prescription for aerobic and resistive training based on initial evaluation findings, risk stratification, comorbidities and participant's personal goals.       Expected Outcomes Short Term: Increase workloads from initial exercise prescription for resistance, speed, and METs.;Short Term: Perform resistance training exercises routinely during rehab and add in resistance training at home;Long Term: Improve cardiorespiratory fitness, muscular endurance and strength as measured by increased METs and functional capacity (6MWT)       Able to understand and use rate of perceived exertion (RPE) scale Yes       Intervention Provide education and explanation on how to use RPE scale       Expected Outcomes Short Term: Able to use RPE daily in rehab to  express subjective intensity level;Long Term:  Able to use RPE to guide intensity level when exercising  independently       Able to understand and use Dyspnea scale Yes       Intervention Provide education and explanation on how to use Dyspnea scale       Expected Outcomes Short Term: Able to use Dyspnea scale daily in rehab to express subjective sense of shortness of breath during exertion;Long Term: Able to use Dyspnea scale to guide intensity level when exercising independently       Knowledge and understanding of Target Heart Rate Range (THRR) Yes       Intervention Provide education and explanation of THRR including how the numbers were predicted and where they are located for reference       Expected Outcomes Short Term: Able to state/look up THRR;Long Term: Able to use THRR to govern intensity when exercising independently;Short Term: Able to use daily as guideline for intensity in rehab       Able to check pulse independently Yes       Intervention Provide education and demonstration on how to check pulse in carotid and radial arteries.;Review the importance of being able to check your own pulse for safety during independent exercise       Expected Outcomes Short Term: Able to explain why pulse checking is important during independent exercise;Long Term: Able to check pulse independently and accurately       Understanding of Exercise Prescription Yes       Intervention Provide education, explanation, and written materials on patient's individual exercise prescription       Expected Outcomes Short Term: Able to explain program exercise prescription;Long Term: Able to explain home exercise prescription to exercise independently                Exercise Goals Re-Evaluation :  Exercise Goals Re-Evaluation     Row Name 02/16/22 1022 03/02/22 1442 03/17/22 1522 03/30/22 1206       Exercise Goal Re-Evaluation   Exercise Goals Review Able to understand and use rate of perceived exertion  (RPE) scale;Able to understand and use Dyspnea scale;Knowledge and understanding of Target Heart Rate Range (THRR);Understanding of Exercise Prescription Increase Physical Activity;Increase Strength and Stamina;Understanding of Exercise Prescription Increase Physical Activity;Increase Strength and Stamina;Understanding of Exercise Prescription Increase Physical Activity;Increase Strength and Stamina;Understanding of Exercise Prescription    Comments Reviewed RPE scale, THR and program prescription with pt today.  Pt voiced understanding and was given a copy of goals to take home. [name] is doing well for the first couple of sessions he has been here. He has not walked yet due to MSK limitations. He has been working at level 3 on both the T5 and T4 machines. Oxygen saturations are staying well above 88%. We will continue to monitor as he progresses in the program. We hope to see some walking into his regimen if he is able to at some point. [name] has not attended rehab since the last review. We will reach out to him and explain the importance of good attendance in the program. Once he returns we will continue to monitor his progress. [name] has not been to rehab since last review. He had a thoracentesis completed on 1/9 and we are awaiting for clearance to return to rehab. Will continue to receive updates with patient.    Expected Outcomes Short: Use RPE daily to regulate intensity. Long: Follow program prescription in THR. Short: Continue exercise prescription, start with slow walking if able Long: Increase overall MET level Short: Return to regular attendance in rehab. Long:  Continue to improve strength and stamina. Short: Get clearance to return to rehab Long: Graduate from [employer]             Discharge Exercise Prescription (Final Exercise Prescription Changes):  Exercise Prescription Changes - 03/02/22 1400       Response to Exercise   Blood Pressure (Admit) 142/72    Blood Pressure  (Exercise) 128/60    Blood Pressure (Exit) 128/70    Heart Rate (Admit) 95 bpm    Heart Rate (Exercise) 115 bpm    Heart Rate (Exit) 92 bpm    Oxygen Saturation (Admit) 98 %    Oxygen Saturation (Exercise) 90 %    Oxygen Saturation (Exit) 94 %    Rating of Perceived Exertion (Exercise) 13    Perceived Dyspnea (Exercise) 3    Symptoms SOB    Comments 3rd full day of exercise    Duration Progress to 30 minutes of  aerobic without signs/symptoms of physical distress    Intensity THRR unchanged      Progression   Progression Continue to progress workloads to maintain intensity without signs/symptoms of physical distress.    Average METs 2.18      Resistance Training   Training Prescription Yes    Weight 2 lb    Reps 10-15      Interval Training   Interval Training No      Recumbant Bike   Level 1    Minutes 15    METs 1.9      NuStep   Level 3    Minutes 30    METs 2.3      T5 Nustep   Level 3    Minutes 30    METs 1.9      Oxygen   Maintain Oxygen Saturation 88% or higher             Nutrition:  Target Goals: Understanding of nutrition guidelines, daily intake of sodium '1500mg'$ , cholesterol '200mg'$ , calories 30% from fat and 7% or less from saturated fats, daily to have 5 or more servings of fruits and vegetables.  Education: All About Nutrition: -Group instruction provided by verbal, written material, interactive activities, discussions, models, and posters to present general guidelines for heart healthy nutrition including fat, fiber, MyPlate, the role of sodium in heart healthy nutrition, utilization of the nutrition label, and utilization of this knowledge for meal planning. Follow up email sent as well. Written material given at graduation. Flowsheet Row Pulmonary Rehab from 02/25/2022 in [facility] Cardiac and Pulmonary Rehab  Education need identified 02/02/22       Biometrics:  Pre Biometrics - 02/02/22 1714       Pre Biometrics   Height 5' 10.75"  (1.797 m)    Weight 307 lb 11.2 oz (139.6 kg)    Waist Circumference 56 inches    Hip Circumference 54 inches    Waist to Hip Ratio 1.04 %    BMI (Calculated) 43.22    Single Leg Stand 0 seconds              Nutrition Therapy Plan and Nutrition Goals:  Nutrition Therapy & Goals - 02/02/22 1607       Nutrition Therapy   Diet Heart healthy, low Na, T2DM    Drug/Food Interactions Statins/Certain Fruits    Protein (specify units) 80-85g    Fiber 30 grams    Whole Grain Foods 3 servings    Saturated Fats 16 max. grams    Fruits and  Vegetables 8 servings/day    Sodium 2 grams      Personal Nutrition Goals   Nutrition Goal ST: practice building balanced meals with fiber, healthy fat, and protein. Continue to limit sodium. LT: limit Na <2g/day, follow MyPlate guidelines, manage A1C <7%    Comments 72 y.o. M admitted to pulmonary rehab for heart failure. PMHx includes T2DM, HTN, CAD, HLD, CKD stg 2. Relevant medications includes lipitor, ferrous sulfate, vitamin C, B-12, furosemide, novolog, lantus. He has a continuous meter and uses his short acting insulin using his current BG. [name] reports living with his son and his granddaughter. he is now drinking mostly water and limiting sodium. B: coffee with doughnut or muffin or poptart and some water L: leftovers, sandwich (pimento cheese or peanut butter - used to have deli meat) D: fruit - apples or sandwich (pimento cheese or peanut butter - used to have deli meat), non-starchy vegetables ("anything and everything" - he enjoys going to the farmers market), potatoes, steak, chicken, fish (cod). He cooks meat with olive oil on pan - hes getting used ot an air fryer. S: does not snack late at night anymore. Sometimes he rpeorts just hvaing a baked potato to eat. Discussed MyPlate and balancing meals as well as heart healthy eating and T2DM MNT.      Intervention Plan   Intervention Prescribe, educate and counsel regarding individualized specific  dietary modifications aiming towards targeted core components such as weight, hypertension, lipid management, diabetes, heart failure and other comorbidities.;Nutrition handout(s) given to patient.    Expected Outcomes Short Term Goal: Understand basic principles of dietary content, such as calories, fat, sodium, cholesterol and nutrients.;Short Term Goal: A plan has been developed with personal nutrition goals set during dietitian appointment.;Long Term Goal: Adherence to prescribed nutrition plan.             Nutrition Assessments:  MEDIFICTS Score Key: ?70 Need to make dietary changes  40-70 Heart Healthy Diet ? 40 Therapeutic Level Cholesterol Diet   Picture Your Plate Scores: <18 Unhealthy dietary pattern with much room for improvement. 41-50 Dietary pattern unlikely to meet recommendations for good health and room for improvement. 51-60 More healthful dietary pattern, with some room for improvement.  >60 Healthy dietary pattern, although there may be some specific behaviors that could be improved.   Nutrition Goals Re-Evaluation:  Nutrition Goals Re-Evaluation     [address] Name 02/27/22 0935             Goals   Current Weight 306 lb (138.8 kg)       Nutrition Goal Eat a diet that helps his sugar.       Comment [name] is taking his medication properly to help his diabetes. He is taking his shots three times a day before each meal. His A1c is around 6.7. He wants to continue to work on his blood sugar levels.       Expected Outcome Short: eat foods that help with his diabetes. Long: reduce A1c.                Nutrition Goals Discharge (Final Nutrition Goals Re-Evaluation):  Nutrition Goals Re-Evaluation - 02/27/22 0935       Goals   Current Weight 306 lb (138.8 kg)    Nutrition Goal Eat a diet that helps his sugar.    Comment [name] is taking his medication properly to help his diabetes. He is taking his shots three times a day before each meal. His A1c is around  6.7.  He wants to continue to work on his blood sugar levels.    Expected Outcome Short: eat foods that help with his diabetes. Long: reduce A1c.             Psychosocial: Target Goals: Acknowledge presence or absence of significant depression and/or stress, maximize coping skills, provide positive support system. Participant is able to verbalize types and ability to use techniques and skills needed for reducing stress and depression.   Education: Stress, Anxiety, and Depression - Group verbal and visual presentation to define topics covered.  Reviews how body is impacted by stress, anxiety, and depression.  Also discusses healthy ways to reduce stress and to treat/manage anxiety and depression.  Written material given at graduation. Flowsheet Row Pulmonary Rehab from 02/25/2022 in [facility] Card[facility] and Pulmonary Rehab  Education need identified 02/02/22       Education: Sleep Hygiene -Provides group verbal and written instruction about how sleep can affect your health.  Define sleep hygiene, discuss sleep cycles and impact of sleep habits. Review good sleep hygiene tips.    Initial Review & Psychosocial Screening:  Initial Psych Review & Screening - 01/26/22 1113       Initial Review   Current issues with None Identified      Family Dynamics   Good Support System? Yes    Comments He can look to his son and grandaughter for support. He is concerned about his health due to his fluid build up and shortness of breath.      Barriers   Psychosocial barriers to participate in program The patient should benefit from training in stress management and relaxation.      Screening Interventions   Interventions Encouraged to exercise;To provide support and resources with identified psychosocial needs;Provide feedback about the scores to participant    Expected Outcomes Long Term Goal: Stressors or current issues are controlled or eliminated.;Short Term goal: Utilizing psychosocial counselor, staff  and physician to assist with identification of specific Stressors or current issues interfering with healing process. Setting desired goal for each stressor or current issue identified.;Short Term goal: Identification and review with participant of any Quality of Life or Depression concerns found by scoring the questionnaire.;Long Term goal: The participant improves quality of Life and PHQ9 Scores as seen by post scores and/or verbalization of changes             Quality of Life Scores:  Quality of Life - 02/02/22 1716       Quality of Life   Select Quality of Life      Quality of Life Scores   Health/Function Pre 13.38 %    Socioeconomic Pre 25.5 %    Psych/Spiritual Pre 29.14 %    Family Pre 30 %    GLOBAL Pre 21.43 %            Scores of 19 and below usually indicate a poorer quality of life in these areas.  A difference of  2-3 points is a clinically meaningful difference.  A difference of 2-3 points in the total score of the Quality of Life Index has been associated with significant improvement in overall quality of life, self-image, physical symptoms, and general health in studies assessing change in quality of life.  PHQ-9: Review Flowsheet       02/27/2022 02/02/2022 10/16/2021  Depression screen PHQ 2/9  Decreased Interest 0 0 0  Down, Depressed, Hopeless 0 0 0  PHQ - 2 Score 0 0 0  Altered  sleeping 0 0 -  Tired, decreased energy 2 3 -  Change in appetite 2 2 -  Feeling bad or failure about yourself  0 0 -  Trouble concentrating 0 0 -  Moving slowly or fidgety/restless 0 1 -  Suicidal thoughts 0 0 -  PHQ-9 Score 4 6 -  Difficult doing work/chores Not difficult at all Somewhat difficult -   Interpretation of Total Score  Total Score Depression Severity:  1-4 = Minimal depression, 5-9 = Mild depression, 10-14 = Moderate depression, 15-19 = Moderately severe depression, 20-27 = Severe depression   Psychosocial Evaluation and Intervention:  Psychosocial  Evaluation - 01/26/22 1115       Psychosocial Evaluation & Interventions   Interventions Stress management education;Relaxation education;Encouraged to exercise with the program and follow exercise prescription    Comments He can look to his son and grandaughter for support. He is concerned about his health due to his fluid build up and shortness of breath.    Expected Outcomes Short: Start [facility] to help with mood. Long: Maintain a healthy mental state.    Continue Psychosocial Services  Follow up required by staff             Psychosocial Re-Evaluation:  Psychosocial Re-Evaluation     [address] Name 02/27/22 [phone]             Psychosocial Re-Evaluation   Current issues with None Identified       Comments Reviewed patient health questionnaire (PHQ-9) with patient for follow up. Previously, patients score indicated signs/symptoms of depression.  Reviewed to see if patient is improving symptom wise while in program.  Score improved and patient states that it is because he is able to exercise more.       Expected Outcomes Short: Continue to attend [facility] regularly for regular exercise and social engagement. Long: Continue to improve symptoms and manage a positive mental state.       Interventions Encouraged to attend Pulmonary Rehabilitation for the exercise       Continue Psychosocial Services  Follow up required by staff                Psychosocial Discharge (Final Psychosocial Re-Evaluation):  Psychosocial Re-Evaluation - 02/27/22 0933       Psychosocial Re-Evaluation   Current issues with None Identified    Comments Reviewed patient health questionnaire (PHQ-9) with patient for follow up. Previously, patients score indicated signs/symptoms of depression.  Reviewed to see if patient is improving symptom wise while in program.  Score improved and patient states that it is because he is able to exercise more.    Expected Outcomes Short: Continue to attend [facility] regularly  for regular exercise and social engagement. Long: Continue to improve symptoms and manage a positive mental state.    Interventions Encouraged to attend Pulmonary Rehabilitation for the exercise    Continue Psychosocial Services  Follow up required by staff             Education: Education Goals: Education classes will be provided on a weekly basis, covering required topics. Participant will state understanding/return demonstration of topics presented.  Learning Barriers/Preferences:  Learning Barriers/Preferences - 01/26/22 1112       Learning Barriers/Preferences   Learning Barriers None    Learning Preferences None             General Pulmonary Education Topics:  Infection Prevention: - Provides verbal and written material to individual with discussion of infection control including proper hand  washing and proper equipment cleaning during exercise session. Flowsheet Row Pulmonary Rehab from 02/25/2022 in [facility] Cardiac and Pulmonary Rehab  Date 02/02/22  Educator [facility]  Instruction Review Code 1- Verbalizes Understanding       Falls Prevention: - Provides verbal and written material to individual with discussion of falls prevention and safety. Flowsheet Row Pulmonary Rehab from 02/25/2022 in [facility] Cardiac and Pulmonary Rehab  Date 02/02/22  Educator [facility]  Instruction Review Code 1- Verbalizes Understanding       Chronic Lung Disease Review: - Group verbal instruction with posters, models, PowerPoint presentations and videos,  to review new updates, new respiratory medications, new advancements in procedures and treatments. Providing information on websites and "800" numbers for continued self-education. Includes information about supplement oxygen, available portable oxygen systems, continuous and intermittent flow rates, oxygen safety, concentrators, and Medicare reimbursement for oxygen. Explanation of Pulmonary Drugs, including class, frequency, complications,  importance of spacers, rinsing mouth after steroid MDI's, and proper cleaning methods for nebulizers. Review of basic lung anatomy and physiology related to function, structure, and complications of lung disease. Review of risk factors. Discussion about methods for diagnosing sleep apnea and types of masks and machines for OSA. Includes a review of the use of types of environmental controls: home humidity, furnaces, filters, dust mite/pet prevention, HEPA vacuums. Discussion about weather changes, air quality and the benefits of nasal washing. Instruction on Warning signs, infection symptoms, calling MD promptly, preventive modes, and value of vaccinations. Review of effective airway clearance, coughing and/or vibration techniques. Emphasizing that all should Create an Action Plan. Written material given at graduation.   AED/CPR: - Group verbal and written instruction with the use of models to demonstrate the basic use of the AED with the basic ABC's of resuscitation.    Anatomy and Cardiac Procedures: - Group verbal and visual presentation and models provide information about basic cardiac anatomy and function. Reviews the testing methods done to diagnose heart disease and the outcomes of the test results. Describes the treatment choices: Medical Management, Angioplasty, or Coronary Bypass Surgery for treating various heart conditions including Myocardial Infarction, Angina, Valve Disease, and Cardiac Arrhythmias.  Written material given at graduation. Flowsheet Row Pulmonary Rehab from 02/25/2022 in [facility] Cardiac and Pulmonary Rehab  Education need identified 02/02/22       Medication Safety: - Group verbal and visual instruction to review commonly prescribed medications for heart and lung disease. Reviews the medication, class of the drug, and side effects. Includes the steps to properly store meds and maintain the prescription regimen.  Written material given at graduation. Flowsheet Row  Pulmonary Rehab from 02/25/2022 in [facility] Cardiac and Pulmonary Rehab  Date 02/25/22  Educator [facility]  Instruction Review Code 1- Verbalizes Understanding       Other: -Provides group and verbal instruction on various topics (see comments)   Knowledge Questionnaire Score:  Knowledge Questionnaire Score - 02/02/22 1720       Knowledge Questionnaire Score   Pre Score 21/26              Core Components/Risk Factors/Patient Goals at Admission:  Personal Goals and Risk Factors at Admission - 02/02/22 1720       Core Components/Risk Factors/Patient Goals on Admission    Weight Management Yes;Weight Loss;Obesity    Intervention Weight Management: Develop a combined nutrition and exercise program designed to reach desired caloric intake, while maintaining appropriate intake of nutrient and fiber, sodium and fats, and appropriate energy expenditure required for the weight goal.;Weight Management:  Provide education and appropriate resources to help participant work on and attain dietary goals.;Weight Management/Obesity: Establish reasonable short term and long term weight goals.;Obesity: Provide education and appropriate resources to help participant work on and attain dietary goals.    Admit Weight 307 lb 11.2 oz (139.6 kg)    Goal Weight: Short Term 295 lb (133.8 kg)    Goal Weight: Long Term 250 lb (113.4 kg)    Expected Outcomes Short Term: Continue to assess and modify interventions until short term weight is achieved;Long Term: Adherence to nutrition and physical activity/exercise program aimed toward attainment of established weight goal;Weight Maintenance: Understanding of the daily nutrition guidelines, which includes 25-35% calories from fat, 7% or less cal from saturated fats, less than '200mg'$  cholesterol, less than 1.5gm of sodium, & 5 or more servings of fruits and vegetables daily;Weight Loss: Understanding of general recommendations for a balanced deficit meal plan, which promotes  1-2 lb weight loss per week and includes a negative energy balance of [phone] kcal/d;Understanding recommendations for meals to include 15-35% energy as protein, 25-35% energy from fat, 35-60% energy from carbohydrates, less than '200mg'$  of dietary cholesterol, 20-35 gm of total fiber daily;Understanding of distribution of calorie intake throughout the day with the consumption of 4-5 meals/snacks    Improve shortness of breath with ADL's Yes    Intervention Provide education, individualized exercise plan and daily activity instruction to help decrease symptoms of SOB with activities of daily living.    Expected Outcomes Short Term: Improve cardiorespiratory fitness to achieve a reduction of symptoms when performing ADLs;Long Term: Be able to perform more ADLs without symptoms or delay the onset of symptoms    Diabetes Yes    Intervention Provide education about signs/symptoms and action to take for hypo/hyperglycemia.;Provide education about proper nutrition, including hydration, and aerobic/resistive exercise prescription along with prescribed medications to achieve blood glucose in normal ranges: Fasting glucose 65-99 mg/dL    Expected Outcomes Short Term: Participant verbalizes understanding of the signs/symptoms and immediate care of hyper/hypoglycemia, proper foot care and importance of medication, aerobic/resistive exercise and nutrition plan for blood glucose control.;Long Term: Attainment of HbA1C < 7%.    Heart Failure Yes    Intervention Provide a combined exercise and nutrition program that is supplemented with education, support and counseling about heart failure. Directed toward relieving symptoms such as shortness of breath, decreased exercise tolerance, and extremity edema.    Expected Outcomes Improve functional capacity of life;Short term: Attendance in program 2-3 days a week with increased exercise capacity. Reported lower sodium intake. Reported increased fruit and vegetable intake.  Reports medication compliance.;Short term: Daily weights obtained and reported for increase. Utilizing diuretic protocols set by physician.;Long term: Adoption of self-care skills and reduction of barriers for early signs and symptoms recognition and intervention leading to self-care maintenance.    Hypertension Yes    Intervention Provide education on lifestyle modifcations including regular physical activity/exercise, weight management, moderate sodium restriction and increased consumption of fresh fruit, vegetables, and low fat dairy, alcohol moderation, and smoking cessation.;Monitor prescription use compliance.    Expected Outcomes Short Term: Continued assessment and intervention until BP is < 140/8m HG in hypertensive participants. < 130/875mHG in hypertensive participants with diabetes, heart failure or chronic kidney disease.;Long Term: Maintenance of blood pressure at goal levels.    Lipids Yes    Intervention Provide education and support for participant on nutrition & aerobic/resistive exercise along with prescribed medications to achieve LDL '70mg'$ , HDL >'40mg'$ .    Expected  Outcomes Short Term: Participant states understanding of desired cholesterol values and is compliant with medications prescribed. Participant is following exercise prescription and nutrition guidelines.;Long Term: Cholesterol controlled with medications as prescribed, with individualized exercise RX and with personalized nutrition plan. Value goals: LDL < '70mg'$ , HDL > 40 mg.             Education:Diabetes - Individual verbal and written instruction to review signs/symptoms of diabetes, desired ranges of glucose level fasting, after meals and with exercise. Acknowledge that pre and post exercise glucose checks will be done for 3 sessions at entry of program. Flowsheet Row Pulmonary Rehab from 02/25/2022 in [facility] Cardiac and Pulmonary Rehab  Date 02/02/22  Educator [facility]  Instruction Review Code 1- Verbalizes  Understanding       Know Your Numbers and Heart Failure: - Group verbal and visual instruction to discuss disease risk factors for cardiac and pulmonary disease and treatment options.  Reviews associated critical values for Overweight/Obesity, Hypertension, Cholesterol, and Diabetes.  Discusses basics of heart failure: signs/symptoms and treatments.  Introduces Heart Failure Zone chart for action plan for heart failure.  Written material given at graduation. Flowsheet Row Pulmonary Rehab from 02/25/2022 in [facility] Cardiac and Pulmonary Rehab  Education need identified 02/02/22       Core Components/Risk Factors/Patient Goals Review:   Goals and Risk Factor Review     Row Name 02/27/22 0928             Core Components/Risk Factors/Patient Goals Review   Personal Goals Review Improve shortness of breath with ADL's       Review Spoke to patient about their shortness of breath and what they can do to improve. Patient has been informed of breathing techniques when starting the program. Patient is informed to tell staff if they have had any med changes and that certain meds they are taking or not taking can be causing shortness of breath.       Expected Outcomes Short: Attend [facility] regularly to improve shortness of breath with ADL's. Long: maintain independence with ADL's                Core Components/Risk Factors/Patient Goals at Discharge (Final Review):   Goals and Risk Factor Review - 02/27/22 0928       Core Components/Risk Factors/Patient Goals Review   Personal Goals Review Improve shortness of breath with ADL's    Review Spoke to patient about their shortness of breath and what they can do to improve. Patient has been informed of breathing techniques when starting the program. Patient is informed to tell staff if they have had any med changes and that certain meds they are taking or not taking can be causing shortness of breath.    Expected Outcomes Short: Attend [facility]  regularly to improve shortness of breath with ADL's. Long: maintain independence with ADL's             ITP Comments:  ITP Comments     Row Name 01/26/22 1110 02/02/22 1701 02/11/22 1051 02/16/22 1022 03/11/22 1119   ITP Comments Virtual Visit completed. Patient informed on EP and RD appointment and 6 Minute walk test. Patient also informed of patient health questionnaires on My Chart. Patient Verbalizes understanding. Visit diagnosis can be found in [facility] 12/23/2021. Completed 6MWT and gym orientation. Initial ITP created and sent for review to Dr. Zetta Bills, Medical Director. 30 Day review completed. Medical Director ITP review done, changes made as directed, and signed approval by Medical Director.  new to program First full day of exercise!  Patient was oriented to gym and equipment including functions, settings, policies, and procedures.  Patient's individual exercise prescription and treatment plan were reviewed.  All starting workloads were established based on the results of the 6 minute walk test done at initial orientation visit.  The plan for exercise progression was also introduced and progression will be customized based on patient's performance and goals. 30 Day review completed. Medical Director ITP review done, changes made as directed, and signed approval by Medical Director.    new to program    Row Name 03/30/22 1208 04/06/22 1410 04/08/22 1052       ITP Comments Patient has been out. has not attended since 12/16. He had a thoracentesis completed on 03/24/22 and awaiting clearance to return. Spoke with patient who states he is feeling much better. Per Dr. Lanney Gins, would like to see patient in follow up first before returning back with rehab. Appt is on 1/26 and first appt back is 1/29 unless patient will call us if anything changes or if MD says otherwise. Patient in agreement. 30 Day review completed. Medical Director ITP review done, changes made as directed, and signed  approval by Medical Director.   remains out for medical reason              Comments:

## 2022-04-09 ENCOUNTER — Other Ambulatory Visit: Payer: Self-pay | Admitting: Oncology

## 2022-04-09 ENCOUNTER — Inpatient Hospital Stay: Payer: Medicare HMO

## 2022-04-09 VITALS — BP 127/55 | HR 92 | Temp 97.8°F | Resp 20

## 2022-04-09 DIAGNOSIS — D509 Iron deficiency anemia, unspecified: Secondary | ICD-10-CM | POA: Diagnosis not present

## 2022-04-09 DIAGNOSIS — Z79899 Other long term (current) drug therapy: Secondary | ICD-10-CM | POA: Diagnosis not present

## 2022-04-09 LAB — MISC LABCORP TEST (SEND OUT): Labcorp test code: 9985

## 2022-04-09 MED ORDER — SODIUM CHLORIDE 0.9 % IV SOLN
200.0000 mg | Freq: Once | INTRAVENOUS | Status: AC
  Administered 2022-04-09: 200 mg via INTRAVENOUS
  Filled 2022-04-09: qty 10

## 2022-04-09 MED ORDER — ACETAMINOPHEN 325 MG PO TABS
650.0000 mg | ORAL_TABLET | Freq: Once | ORAL | Status: AC
  Administered 2022-04-09: 650 mg via ORAL
  Filled 2022-04-09: qty 2

## 2022-04-09 MED ORDER — SODIUM CHLORIDE 0.9 % IV SOLN
Freq: Once | INTRAVENOUS | Status: AC
  Filled 2022-04-09: qty 250

## 2022-04-09 MED ORDER — FAMOTIDINE IN NACL 20-0.9 MG/50ML-% IV SOLN
20.0000 mg | Freq: Once | INTRAVENOUS | Status: AC
  Administered 2022-04-09: 20 mg via INTRAVENOUS
  Filled 2022-04-09: qty 50

## 2022-04-09 MED ORDER — SODIUM CHLORIDE 0.9 % IV SOLN
1000.0000 mg | Freq: Once | INTRAVENOUS | Status: DC
  Filled 2022-04-09: qty 100

## 2022-04-09 MED ORDER — DIPHENHYDRAMINE HCL 50 MG/ML IJ SOLN
25.0000 mg | Freq: Once | INTRAMUSCULAR | Status: AC
  Administered 2022-04-09: 25 mg via INTRAVENOUS
  Filled 2022-04-09: qty 1

## 2022-04-09 NOTE — Progress Notes (Signed)
Pt did not receive rituximab today.  Will be rescheduled for Monday.

## 2022-04-09 NOTE — Patient Instructions (Signed)
[facility]  Discharge Instructions: Thank you for choosing [facility] to provide your oncology and hematology care.  If you have a lab appointment with the [facility], please go directly to the [facility] and check in at the registration area.  Wear comfortable clothing and clothing appropriate for easy access to any Portacath or PICC line.   We strive to give you quality time with your provider. You may need to reschedule your appointment if you arrive late (15 or more minutes).  Arriving late affects you and other patients whose appointments are after yours.  Also, if you miss three or more appointments without notifying the office, you may be dismissed from the clinic at the provider's discretion.      For prescription refill requests, have your pharmacy contact our office and allow 72 hours for refills to be completed.    Today you received the following chemotherapy and/or immunotherapy agents rituximab     To help prevent nausea and vomiting after your treatment, we encourage you to take your nausea medication as directed.  BELOW ARE SYMPTOMS THAT SHOULD BE REPORTED IMMEDIATELY: *FEVER GREATER THAN 100.4 F (38 C) OR HIGHER *CHILLS OR SWEATING *NAUSEA AND VOMITING THAT IS NOT CONTROLLED WITH YOUR NAUSEA MEDICATION *UNUSUAL SHORTNESS OF BREATH *UNUSUAL BRUISING OR BLEEDING *URINARY PROBLEMS (pain or burning when urinating, or frequent urination) *BOWEL PROBLEMS (unusual diarrhea, constipation, pain near the anus) TENDERNESS IN MOUTH AND THROAT WITH OR WITHOUT PRESENCE OF ULCERS (sore throat, sores in mouth, or a toothache) UNUSUAL RASH, SWELLING OR PAIN  UNUSUAL VAGINAL DISCHARGE OR ITCHING   Items with * indicate a potential emergency and should be followed up as soon as possible or go to the Emergency Department if any problems should occur.  Please show the CHEMOTHERAPY ALERT CARD or IMMUNOTHERAPY ALERT CARD at check-in to  the Emergency Department and triage nurse.  Should you have questions after your visit or need to cancel or reschedule your appointment, please contact [facility]  [phone] and follow the prompts.  Office hours are 8:00 a.m. to 4:30 p.m. Monday - Friday. Please note that voicemails left after 4:00 p.m. may not be returned until the following business day.  We are closed weekends and major holidays. You have access to a nurse at all times for urgent questions. Please call the main number to the clinic [phone] and follow the prompts.  For any non-urgent questions, you may also contact your provider using MyChart. We now offer e-Visits for anyone 3 and older to request care online for non-urgent symptoms. For details visit [email or web address].   Rituximab Injection What is this medication? RITUXIMAB (ri TUX i mab) treats leukemia and lymphoma. It works by blocking a protein that causes cancer cells to grow and multiply. This helps to slow or stop the spread of cancer cells. It may also be used to treat autoimmune conditions, such as arthritis. It works by slowing down an overactive immune system. It is a monoclonal antibody. This medicine may be used for other purposes; ask your health care provider or pharmacist if you have questions. COMMON BRAND NAME(S): RIABNI, Rituxan, RUXIENCE, truxima What should I tell my care team before I take this medication? They need to know if you have any of these conditions: Chest pain Heart disease Immune system problems Infection, such as chickenpox, cold sores, hepatitis B, herpes Irregular heartbeat or rhythm Kidney disease Low blood counts, such as low  white cells, platelets, red cells Lung disease Recent or upcoming vaccine An unusual or allergic reaction to rituximab, other medications, foods, dyes, or preservatives Pregnant or trying to get pregnant Breast-feeding How should I use this medication? This  medication is injected into a vein. It is given by a care team in a hospital or clinic setting. A special MedGuide will be given to you before each treatment. Be sure to read this information carefully each time. Talk to your care team about the use of this medication in children. While this medication may be prescribed for children as young as 6 months for selected conditions, precautions do apply. Overdosage: If you think you have taken too much of this medicine contact a poison control center or emergency room at once. NOTE: This medicine is only for you. Do not share this medicine with others. What if I miss a dose? Keep appointments for follow-up doses. It is important not to miss your dose. Call your care team if you are unable to keep an appointment. What may interact with this medication? Do not take this medication with any of the following: Live vaccines This medication may also interact with the following: Cisplatin This list may not describe all possible interactions. Give your health care provider a list of all the medicines, herbs, non-prescription drugs, or dietary supplements you use. Also tell them if you smoke, drink alcohol, or use illegal drugs. Some items may interact with your medicine. What should I watch for while using this medication? Your condition will be monitored carefully while you are receiving this medication. You may need blood work while taking this medication. This medication can cause serious infusion reactions. To reduce the risk your care team may give you other medications to take before receiving this one. Be sure to follow the directions from your care team. This medication may increase your risk of getting an infection. Call your care team for advice if you get a fever, chills, sore throat, or other symptoms of a cold or flu. Do not treat yourself. Try to avoid being around people who are sick. Call your care team if you are around anyone with measles,  chickenpox, or if you develop sores or blisters that do not heal properly. Avoid taking medications that contain aspirin, acetaminophen, ibuprofen, naproxen, or ketoprofen unless instructed by your care team. These medications may hide a fever. This medication may cause serious skin reactions. They can happen weeks to months after starting the medication. Contact your care team right away if you notice fevers or flu-like symptoms with a rash. The rash may be red or purple and then turn into blisters or peeling of the skin. You may also notice a red rash with swelling of the face, lips, or lymph nodes in your neck or under your arms. In some patients, this medication may cause a serious brain infection that may cause death. If you have any problems seeing, thinking, speaking, walking, or standing, tell your care team right away. If you cannot reach your care team, urgently seek another source of medical care. Talk to your care team if you may be pregnant. Serious birth defects can occur if you take this medication during pregnancy and for 12 months after the last dose. You will need a negative pregnancy test before starting this medication. Contraception is recommended while taking this medication and for 12 months after the last dose. Your care team can help you find the option that works for you. Do not breastfeed while  taking this medication and for at least 6 months after the last dose. What side effects may I notice from receiving this medication? Side effects that you should report to your care team as soon as possible: Allergic reactions or angioedema--skin rash, itching or hives, swelling of the face, eyes, lips, tongue, arms, or legs, trouble swallowing or breathing Bowel blockage--stomach cramping, unable to have a bowel movement or pass gas, loss of appetite, vomiting Dizziness, loss of balance or coordination, confusion or trouble speaking Heart attack--pain or tightness in the chest,  shoulders, arms, or jaw, nausea, shortness of breath, cold or clammy skin, feeling faint or lightheaded Heart rhythm changes--fast or irregular heartbeat, dizziness, feeling faint or lightheaded, chest pain, trouble breathing Infection--fever, chills, cough, sore throat, wounds that don't heal, pain or trouble when passing urine, general feeling of discomfort or being unwell Infusion reactions--chest pain, shortness of breath or trouble breathing, feeling faint or lightheaded Kidney injury--decrease in the amount of urine, swelling of the ankles, hands, or feet Liver injury--right upper belly pain, loss of appetite, nausea, light-colored stool, dark yellow or brown urine, yellowing skin or eyes, unusual weakness or fatigue Redness, blistering, peeling, or loosening of the skin, including inside the mouth Stomach pain that is severe, does not go away, or gets worse Tumor lysis syndrome (TLS)--nausea, vomiting, diarrhea, decrease in the amount of urine, dark urine, unusual weakness or fatigue, confusion, muscle pain or cramps, fast or irregular heartbeat, joint pain Side effects that usually do not require medical attention (report to your care team if they continue or are bothersome): Headache Joint pain Nausea Runny or stuffy nose Unusual weakness or fatigue This list may not describe all possible side effects. Call your doctor for medical advice about side effects. You may report side effects to FDA at 1-800-FDA-1088. Where should I keep my medication? This medication is given in a hospital or clinic. It will not be stored at home. NOTE: This sheet is a summary. It may not cover all possible information. If you have questions about this medicine, talk to your doctor, pharmacist, or health care provider.  2023 Elsevier/Gold Standard (2021-07-15 00:00:00)

## 2022-04-10 DIAGNOSIS — J90 Pleural effusion, not elsewhere classified: Secondary | ICD-10-CM | POA: Diagnosis not present

## 2022-04-11 ENCOUNTER — Inpatient Hospital Stay
Admission: EM | Admit: 2022-04-11 | Discharge: 2022-04-26 | DRG: 166 | Disposition: A | Discharge status: Home Health | Payer: Medicare HMO | Attending: Student | Admitting: Student

## 2022-04-11 ENCOUNTER — Emergency Department: Payer: Medicare HMO

## 2022-04-11 ENCOUNTER — Other Ambulatory Visit: Payer: Self-pay

## 2022-04-11 DIAGNOSIS — I5043 Acute on chronic combined systolic (congestive) and diastolic (congestive) heart failure: Secondary | ICD-10-CM | POA: Diagnosis present

## 2022-04-11 DIAGNOSIS — I10 Essential (primary) hypertension: Secondary | ICD-10-CM | POA: Diagnosis present

## 2022-04-11 DIAGNOSIS — I2489 Other forms of acute ischemic heart disease: Secondary | ICD-10-CM | POA: Diagnosis present

## 2022-04-11 DIAGNOSIS — E871 Hypo-osmolality and hyponatremia: Secondary | ICD-10-CM | POA: Diagnosis present

## 2022-04-11 DIAGNOSIS — Z794 Long term (current) use of insulin: Secondary | ICD-10-CM

## 2022-04-11 DIAGNOSIS — I5023 Acute on chronic systolic (congestive) heart failure: Secondary | ICD-10-CM | POA: Diagnosis present

## 2022-04-11 DIAGNOSIS — Z85820 Personal history of malignant melanoma of skin: Secondary | ICD-10-CM

## 2022-04-11 DIAGNOSIS — Z6841 Body Mass Index (BMI) 40.0 and over, adult: Secondary | ICD-10-CM | POA: Diagnosis not present

## 2022-04-11 DIAGNOSIS — E1165 Type 2 diabetes mellitus with hyperglycemia: Secondary | ICD-10-CM | POA: Diagnosis present

## 2022-04-11 DIAGNOSIS — Z955 Presence of coronary angioplasty implant and graft: Secondary | ICD-10-CM

## 2022-04-11 DIAGNOSIS — Z9689 Presence of other specified functional implants: Secondary | ICD-10-CM | POA: Diagnosis not present

## 2022-04-11 DIAGNOSIS — E1169 Type 2 diabetes mellitus with other specified complication: Secondary | ICD-10-CM | POA: Diagnosis present

## 2022-04-11 DIAGNOSIS — J9621 Acute and chronic respiratory failure with hypoxia: Secondary | ICD-10-CM | POA: Diagnosis present

## 2022-04-11 DIAGNOSIS — Z885 Allergy status to narcotic agent status: Secondary | ICD-10-CM

## 2022-04-11 DIAGNOSIS — I429 Cardiomyopathy, unspecified: Secondary | ICD-10-CM | POA: Diagnosis present

## 2022-04-11 DIAGNOSIS — R000 Tachycardia, unspecified: Secondary | ICD-10-CM | POA: Diagnosis present

## 2022-04-11 DIAGNOSIS — J439 Emphysema, unspecified: Secondary | ICD-10-CM | POA: Diagnosis not present

## 2022-04-11 DIAGNOSIS — J9811 Atelectasis: Secondary | ICD-10-CM | POA: Diagnosis present

## 2022-04-11 DIAGNOSIS — E1122 Type 2 diabetes mellitus with diabetic chronic kidney disease: Secondary | ICD-10-CM | POA: Diagnosis present

## 2022-04-11 DIAGNOSIS — Z87441 Personal history of nephrotic syndrome: Secondary | ICD-10-CM

## 2022-04-11 DIAGNOSIS — J449 Chronic obstructive pulmonary disease, unspecified: Secondary | ICD-10-CM | POA: Diagnosis present

## 2022-04-11 DIAGNOSIS — Z9842 Cataract extraction status, left eye: Secondary | ICD-10-CM

## 2022-04-11 DIAGNOSIS — Z79899 Other long term (current) drug therapy: Secondary | ICD-10-CM

## 2022-04-11 DIAGNOSIS — Z96651 Presence of right artificial knee joint: Secondary | ICD-10-CM | POA: Diagnosis present

## 2022-04-11 DIAGNOSIS — J90 Pleural effusion, not elsewhere classified: Secondary | ICD-10-CM | POA: Diagnosis not present

## 2022-04-11 DIAGNOSIS — R042 Hemoptysis: Secondary | ICD-10-CM | POA: Diagnosis present

## 2022-04-11 DIAGNOSIS — Z9841 Cataract extraction status, right eye: Secondary | ICD-10-CM

## 2022-04-11 DIAGNOSIS — Z7982 Long term (current) use of aspirin: Secondary | ICD-10-CM

## 2022-04-11 DIAGNOSIS — K5900 Constipation, unspecified: Secondary | ICD-10-CM | POA: Diagnosis not present

## 2022-04-11 DIAGNOSIS — R739 Hyperglycemia, unspecified: Secondary | ICD-10-CM | POA: Diagnosis not present

## 2022-04-11 DIAGNOSIS — E669 Obesity, unspecified: Secondary | ICD-10-CM | POA: Diagnosis present

## 2022-04-11 DIAGNOSIS — I959 Hypotension, unspecified: Secondary | ICD-10-CM | POA: Diagnosis present

## 2022-04-11 DIAGNOSIS — R531 Weakness: Secondary | ICD-10-CM | POA: Diagnosis not present

## 2022-04-11 DIAGNOSIS — D509 Iron deficiency anemia, unspecified: Secondary | ICD-10-CM | POA: Diagnosis present

## 2022-04-11 DIAGNOSIS — R32 Unspecified urinary incontinence: Secondary | ICD-10-CM | POA: Diagnosis present

## 2022-04-11 DIAGNOSIS — J9601 Acute respiratory failure with hypoxia: Secondary | ICD-10-CM | POA: Insufficient documentation

## 2022-04-11 DIAGNOSIS — I2510 Atherosclerotic heart disease of native coronary artery without angina pectoris: Secondary | ICD-10-CM | POA: Diagnosis present

## 2022-04-11 DIAGNOSIS — I4510 Unspecified right bundle-branch block: Secondary | ICD-10-CM | POA: Diagnosis present

## 2022-04-11 DIAGNOSIS — N183 Chronic kidney disease, stage 3 unspecified: Secondary | ICD-10-CM | POA: Diagnosis present

## 2022-04-11 DIAGNOSIS — R911 Solitary pulmonary nodule: Secondary | ICD-10-CM | POA: Diagnosis not present

## 2022-04-11 DIAGNOSIS — J918 Pleural effusion in other conditions classified elsewhere: Secondary | ICD-10-CM | POA: Diagnosis present

## 2022-04-11 DIAGNOSIS — G4733 Obstructive sleep apnea (adult) (pediatric): Secondary | ICD-10-CM | POA: Diagnosis present

## 2022-04-11 DIAGNOSIS — J101 Influenza due to other identified influenza virus with other respiratory manifestations: Principal | ICD-10-CM | POA: Diagnosis present

## 2022-04-11 DIAGNOSIS — N052 Unspecified nephritic syndrome with diffuse membranous glomerulonephritis: Secondary | ICD-10-CM | POA: Diagnosis not present

## 2022-04-11 DIAGNOSIS — Z888 Allergy status to other drugs, medicaments and biological substances status: Secondary | ICD-10-CM

## 2022-04-11 DIAGNOSIS — Z1152 Encounter for screening for COVID-19: Secondary | ICD-10-CM

## 2022-04-11 DIAGNOSIS — D631 Anemia in chronic kidney disease: Secondary | ICD-10-CM | POA: Diagnosis present

## 2022-04-11 DIAGNOSIS — E782 Mixed hyperlipidemia: Secondary | ICD-10-CM | POA: Diagnosis present

## 2022-04-11 DIAGNOSIS — N179 Acute kidney failure, unspecified: Secondary | ICD-10-CM | POA: Diagnosis present

## 2022-04-11 DIAGNOSIS — I130 Hypertensive heart and chronic kidney disease with heart failure and stage 1 through stage 4 chronic kidney disease, or unspecified chronic kidney disease: Secondary | ICD-10-CM | POA: Diagnosis present

## 2022-04-11 DIAGNOSIS — R600 Localized edema: Secondary | ICD-10-CM | POA: Diagnosis present

## 2022-04-11 DIAGNOSIS — I5033 Acute on chronic diastolic (congestive) heart failure: Secondary | ICD-10-CM | POA: Diagnosis not present

## 2022-04-11 DIAGNOSIS — R059 Cough, unspecified: Secondary | ICD-10-CM | POA: Diagnosis not present

## 2022-04-11 DIAGNOSIS — Z961 Presence of intraocular lens: Secondary | ICD-10-CM | POA: Diagnosis present

## 2022-04-11 DIAGNOSIS — N0429 Other nephrotic syndrome with diffuse membranous glomerulonephritis: Secondary | ICD-10-CM | POA: Diagnosis present

## 2022-04-11 DIAGNOSIS — E1140 Type 2 diabetes mellitus with diabetic neuropathy, unspecified: Secondary | ICD-10-CM | POA: Diagnosis present

## 2022-04-11 DIAGNOSIS — N182 Chronic kidney disease, stage 2 (mild): Secondary | ICD-10-CM | POA: Diagnosis not present

## 2022-04-11 DIAGNOSIS — Z85828 Personal history of other malignant neoplasm of skin: Secondary | ICD-10-CM

## 2022-04-11 DIAGNOSIS — K7460 Unspecified cirrhosis of liver: Secondary | ICD-10-CM | POA: Diagnosis not present

## 2022-04-11 DIAGNOSIS — I5082 Biventricular heart failure: Secondary | ICD-10-CM | POA: Diagnosis present

## 2022-04-11 DIAGNOSIS — N032 Chronic nephritic syndrome with diffuse membranous glomerulonephritis: Secondary | ICD-10-CM | POA: Insufficient documentation

## 2022-04-11 DIAGNOSIS — R0602 Shortness of breath: Secondary | ICD-10-CM | POA: Diagnosis not present

## 2022-04-11 DIAGNOSIS — E66813 Obesity, class 3: Secondary | ICD-10-CM | POA: Diagnosis present

## 2022-04-11 DIAGNOSIS — J939 Pneumothorax, unspecified: Secondary | ICD-10-CM | POA: Diagnosis not present

## 2022-04-11 DIAGNOSIS — R601 Generalized edema: Secondary | ICD-10-CM | POA: Diagnosis not present

## 2022-04-11 DIAGNOSIS — R339 Retention of urine, unspecified: Secondary | ICD-10-CM | POA: Diagnosis not present

## 2022-04-11 DIAGNOSIS — I509 Heart failure, unspecified: Secondary | ICD-10-CM | POA: Diagnosis not present

## 2022-04-11 DIAGNOSIS — N170 Acute kidney failure with tubular necrosis: Secondary | ICD-10-CM | POA: Diagnosis not present

## 2022-04-11 DIAGNOSIS — I5022 Chronic systolic (congestive) heart failure: Secondary | ICD-10-CM | POA: Diagnosis present

## 2022-04-11 DIAGNOSIS — Z86006 Personal history of melanoma in-situ: Secondary | ICD-10-CM

## 2022-04-11 DIAGNOSIS — I5020 Unspecified systolic (congestive) heart failure: Secondary | ICD-10-CM | POA: Diagnosis not present

## 2022-04-11 DIAGNOSIS — R809 Proteinuria, unspecified: Secondary | ICD-10-CM | POA: Diagnosis not present

## 2022-04-11 DIAGNOSIS — Z9049 Acquired absence of other specified parts of digestive tract: Secondary | ICD-10-CM

## 2022-04-11 LAB — RESP PANEL BY RT-PCR (RSV, FLU A&B, COVID)  RVPGX2
Influenza A by PCR: POSITIVE — AB
Influenza B by PCR: NEGATIVE
Resp Syncytial Virus by PCR: NEGATIVE
SARS Coronavirus 2 by RT PCR: NEGATIVE

## 2022-04-11 LAB — COMPREHENSIVE METABOLIC PANEL
ALT: 15 U/L (ref 0–44)
AST: 23 U/L (ref 15–41)
Albumin: 2.5 g/dL — ABNORMAL LOW (ref 3.5–5.0)
Alkaline Phosphatase: 72 U/L (ref 38–126)
Anion gap: 15 (ref 5–15)
BUN: 63 mg/dL — ABNORMAL HIGH (ref 8–23)
CO2: 22 mmol/L (ref 22–32)
Calcium: 8 mg/dL — ABNORMAL LOW (ref 8.9–10.3)
Chloride: 97 mmol/L — ABNORMAL LOW (ref 98–111)
Creatinine, Ser: 1.69 mg/dL — ABNORMAL HIGH (ref 0.61–1.24)
GFR, Estimated: 42 mL/min — ABNORMAL LOW (ref >60)
Glucose, Bld: 244 mg/dL — ABNORMAL HIGH (ref 70–99)
Potassium: 4.2 mmol/L (ref 3.5–5.1)
Sodium: 134 mmol/L — ABNORMAL LOW (ref 135–145)
Total Bilirubin: 1.4 mg/dL — ABNORMAL HIGH (ref 0.3–1.2)
Total Protein: 5.9 g/dL — ABNORMAL LOW (ref 6.5–8.1)

## 2022-04-11 LAB — CBC WITH DIFFERENTIAL/PLATELET
Abs Immature Granulocytes: 0.03 10*3/uL (ref 0.00–0.07)
Basophils Absolute: 0 10*3/uL (ref 0.0–0.1)
Basophils Relative: 0 %
Eosinophils Absolute: 0 10*3/uL (ref 0.0–0.5)
Eosinophils Relative: 0 %
HCT: 25.8 % — ABNORMAL LOW (ref 39.0–52.0)
Hemoglobin: 8.1 g/dL — ABNORMAL LOW (ref 13.0–17.0)
Immature Granulocytes: 1 %
Lymphocytes Relative: 5 %
Lymphs Abs: 0.3 10*3/uL — ABNORMAL LOW (ref 0.7–4.0)
MCH: 28.2 pg (ref 26.0–34.0)
MCHC: 31.4 g/dL (ref 30.0–36.0)
MCV: 89.9 fL (ref 80.0–100.0)
Monocytes Absolute: 0.5 10*3/uL (ref 0.1–1.0)
Monocytes Relative: 10 %
Neutro Abs: 4.5 10*3/uL (ref 1.7–7.7)
Neutrophils Relative %: 84 %
Platelets: 193 10*3/uL (ref 150–400)
RBC: 2.87 MIL/uL — ABNORMAL LOW (ref 4.22–5.81)
RDW: 15.3 % (ref 11.5–15.5)
WBC: 5.3 10*3/uL (ref 4.0–10.5)
nRBC: 0.4 % — ABNORMAL HIGH (ref 0.0–0.2)

## 2022-04-11 LAB — CBG MONITORING, ED: Glucose-Capillary: 231 mg/dL — ABNORMAL HIGH (ref 70–99)

## 2022-04-11 LAB — TROPONIN I (HIGH SENSITIVITY)
Troponin I (High Sensitivity): 738 ng/L (ref <18)
Troponin I (High Sensitivity): 747 ng/L (ref <18)

## 2022-04-11 LAB — BRAIN NATRIURETIC PEPTIDE: B Natriuretic Peptide: 1319.6 pg/mL — ABNORMAL HIGH (ref 0.0–100.0)

## 2022-04-11 LAB — LACTIC ACID, PLASMA: Lactic Acid, Venous: 2.3 mmol/L (ref 0.5–1.9)

## 2022-04-11 MED ORDER — SODIUM CHLORIDE 0.9 % IV SOLN
500.0000 mg | INTRAVENOUS | Status: DC
  Administered 2022-04-12: 500 mg via INTRAVENOUS
  Filled 2022-04-11 (×2): qty 5

## 2022-04-11 MED ORDER — ACETAMINOPHEN 500 MG PO TABS
1000.0000 mg | ORAL_TABLET | Freq: Once | ORAL | Status: AC
  Administered 2022-04-11: 1000 mg via ORAL
  Filled 2022-04-11: qty 2

## 2022-04-11 MED ORDER — IOHEXOL 350 MG/ML SOLN
75.0000 mL | Freq: Once | INTRAVENOUS | Status: AC | PRN
  Administered 2022-04-11: 75 mL via INTRAVENOUS

## 2022-04-11 MED ORDER — LIDOCAINE-EPINEPHRINE 2 %-1:100000 IJ SOLN
20.0000 mL | Freq: Once | INTRAMUSCULAR | Status: AC
  Administered 2022-04-12: 20 mL
  Filled 2022-04-11: qty 1

## 2022-04-11 MED ORDER — SODIUM CHLORIDE 0.9 % IV SOLN
2.0000 g | INTRAVENOUS | Status: DC
  Administered 2022-04-11: 2 g via INTRAVENOUS
  Filled 2022-04-11: qty 20

## 2022-04-11 NOTE — ED Notes (Signed)
Timeout: MD Joni Fears Left Lung site, Marjorie Smolder RN, Racheal RN Present

## 2022-04-11 NOTE — ED Notes (Signed)
Consent form signed by patient for Left lung Chest tube insertion for Pleural effusion

## 2022-04-11 NOTE — ED Notes (Addendum)
Dr. Joni Fears aware of pts temperature and his trop level

## 2022-04-11 NOTE — ED Provider Notes (Signed)
[facility] Provider Note    Event Date/Time   First MD Initiated Contact with Patient 04/11/22 2023     (approximate)   History   Chief Complaint: Shortness of Breath   HPI  [name] is a 72 y.o. male with a history of hypertension diabetes CHF obesity lymphedema who was brought to the ED due to confusion, hemoptysis, increased shortness of breath since yesterday.  Patient denies chest pain.  Oxygen saturation was 92% on room air at home.  Denies abdominal pain vomiting or diarrhea.     Physical Exam   Triage Vital Signs: ED Triage Vitals  Enc Vitals Group     BP      Pulse      Resp      Temp      Temp src      SpO2      Weight      Height      Head Circumference      Peak Flow      Pain Score      Pain Loc      Pain Edu?      Excl. in [place]?     Most recent vital signs: Vitals:   04/11/22 2204 04/12/22 0000  BP: (!) 115/50 (!) 98/50  Pulse: (!) 107 99  Resp: 20 (!) 21  Temp:    SpO2: 96% 92%    General: Awake, no distress.  CV:  Good peripheral perfusion.  Tachycardia heart rate 105 Resp:  Normal effort.  Very diminished left breath sounds.  Right chest clear to auscultation Abd:  No distention.  Soft nontender Other:  Chronic lymphedema bilateral lower extremities, no calf tenderness or asymmetric swelling.   ED Results / Procedures / Treatments   Labs (all labs ordered are listed, but only abnormal results are displayed) Labs Reviewed  RESP PANEL BY RT-PCR (RSV, FLU A&B, COVID)  RVPGX2 - Abnormal; Notable for the following components:      Result Value   Influenza A by PCR POSITIVE (*)    All other components within normal limits  COMPREHENSIVE METABOLIC PANEL - Abnormal; Notable for the following components:   Sodium 134 (*)    Chloride 97 (*)    Glucose, Bld 244 (*)    BUN 63 (*)    Creatinine, Ser 1.69 (*)    Calcium 8.0 (*)    Total Protein 5.9 (*)    Albumin 2.5 (*)    Total Bilirubin 1.4 (*)    GFR,  Estimated 42 (*)    All other components within normal limits  BRAIN NATRIURETIC PEPTIDE - Abnormal; Notable for the following components:   B Natriuretic Peptide 1,319.6 (*)    All other components within normal limits  CBC WITH DIFFERENTIAL/PLATELET - Abnormal; Notable for the following components:   RBC 2.87 (*)    Hemoglobin 8.1 (*)    HCT 25.8 (*)    nRBC 0.4 (*)    Lymphs Abs 0.3 (*)    All other components within normal limits  LACTIC ACID, PLASMA - Abnormal; Notable for the following components:   Lactic Acid, Venous 2.3 (*)    All other components within normal limits  CBG MONITORING, ED - Abnormal; Notable for the following components:   Glucose-Capillary 231 (*)    All other components within normal limits  TROPONIN I (HIGH SENSITIVITY) - Abnormal; Notable for the following components:   Troponin I (High Sensitivity) 738 (*)  All other components within normal limits  TROPONIN I (HIGH SENSITIVITY) - Abnormal; Notable for the following components:   Troponin I (High Sensitivity) 747 (*)    All other components within normal limits  CULTURE, BLOOD (ROUTINE X 2)  CULTURE, BLOOD (ROUTINE X 2)  LACTIC ACID, PLASMA     EKG Interpreted by me Sinus tachycardia rate 105.  Right axis, right bundle branch block.  No acute ischemic changes.   RADIOLOGY Chest x-ray interpreted by me, shows large left pleural effusion.  Radiology report reviewed.  CT chest interpreted by me, showing large complex fluid collection in the left hemithorax.  Discussed with radiologist, concern for hemorrhagic component.   PROCEDURES:  .Critical Care  Performed by: Carrie Mew, MD Authorized by: Carrie Mew, MD   Critical care provider statement:    Critical care time (minutes):  35   Critical care time was exclusive of:  Separately billable procedures and treating other patients   Critical care was necessary to treat or prevent imminent or life-threatening deterioration of the  following conditions:  Sepsis and respiratory failure   Critical care was time spent personally by me on the following activities:  Development of treatment plan with patient or surrogate, discussions with consultants, evaluation of patient's response to treatment, examination of patient, obtaining history from patient or surrogate, ordering and performing treatments and interventions, ordering and review of laboratory studies, ordering and review of radiographic studies, pulse oximetry, re-evaluation of patient's condition and review of old charts   Care discussed with: admitting provider   Comments:        CHEST TUBE INSERTION  Date/Time: 04/12/2022 12:10 AM  Performed by: Carrie Mew, MD Authorized by: Carrie Mew, MD   Consent:    Consent obtained:  Written   Consent given by:  Patient   Risks, benefits, and alternatives were discussed: yes     Risks discussed:  Bleeding, damage to surrounding structures, infection, incomplete drainage and pain   Alternatives discussed:  Delayed treatment and observation Universal protocol:    Immediately prior to procedure, a time out was called: yes     Patient identity confirmed:  Verbally with patient and arm band Pre-procedure details:    Skin preparation:  Chlorhexidine   Preparation: Patient was prepped and draped in the usual sterile fashion   Sedation:    Sedation type:  None Anesthesia:    Anesthesia method:  Local infiltration   Local anesthetic:  Lidocaine 1% w/o epi Procedure details:    Placement location:  L lateral   Scalpel size:  11   Tube size (Fr):  28   Dissection instrument:  Kelly clamp   Ultrasound guidance: no     Tension pneumothorax: no     Tube connected to:  Water seal   Drainage characteristics:  Yellow   Suture material:  0 silk   Dressing:  4x4 sterile gauze Post-procedure details:    Post-insertion x-ray findings: tube in good position     Procedure completion:  Tolerated well, no immediate  complications    MEDICATIONS ORDERED IN ED: Medications  cefTRIAXone (ROCEPHIN) 2 g in sodium chloride 0.9 % 100 mL IVPB (0 g Intravenous Stopped 04/11/22 2321)  azithromycin (ZITHROMAX) 500 mg in sodium chloride 0.9 % 250 mL IVPB (has no administration in time range)  HYDROmorphone (DILAUDID) injection 0.5 mg (has no administration in time range)  iohexol (OMNIPAQUE) 350 MG/ML injection 75 mL (75 mLs Intravenous Contrast Given 04/11/22 2213)  acetaminophen (TYLENOL) tablet 1,000 mg (  1,000 mg Oral Given 04/11/22 2157)  lidocaine-EPINEPHrine (XYLOCAINE W/EPI) 2 %-1:100000 (with pres) injection 20 mL (20 mLs Infiltration Given 04/12/22 0009)     IMPRESSION / MDM / ASSESSMENT AND PLAN / ED COURSE  I reviewed the triage vital signs and the nursing notes.  DDx: Viral illness, pneumonia, empyema, pneumothorax, AKI, PE, non-STEMI, decompensated heart failure  Patient's presentation is most consistent with acute presentation with potential threat to life or bodily function.  Patient presents with shortness of breath, hemoptysis, confusion, found to be febrile and tachycardic.  Cultures ordered, antibiotics for pneumonia coverage.  Viral swab positive for influenza which I think explains the symptoms.  CT chest obtained which is negative for PE, pleural is suspicious for pulmonary bleeding into the effusion space.  Will place chest tube to decompress.  ----------------------------------------- 12:12 AM on 04/12/2022 ----------------------------------------- Chest tube inserted without difficulty. Immediate drainage of 1855m serous fluid, not grossly bloody. F/u cxr ordered.       FINAL CLINICAL IMPRESSION(S) / ED DIAGNOSES   Final diagnoses:  Influenza A  Recurrent left pleural effusion     Rx / DC Orders   ED Discharge Orders     None        Note:  This document was prepared using Dragon voice recognition software and may include unintentional dictation errors.   SCarrie Mew MD 04/12/22 [phone]

## 2022-04-11 NOTE — ED Triage Notes (Signed)
Pt brought in via EMS for confusion and for coughing up blood. Pt was placed on 3L [place], his 02 sat was 92% on RA. Pt has a hx of CHF and DM. Blood glucose per EMS was 282.

## 2022-04-11 NOTE — H&P (Incomplete)
History and Physical    KINNEY SACKMANN DHR:[id number] DOB: 07-06-1950 DOA: 04/11/2022  PCP: Juluis Pitch, MD Patient coming from: Home  Chief Complaint: Cough, confusion  HPI: [name] is a 72 y.o. male with medical history significant of arthritis, CHF (chronic), CAD, DM2, HLD, HTN, h/o treated melanoma, spinal stenosis who presents for worsening cough, fatigue and sob.  Son and patient provide history.  Per son, Mr. Bognar was confused this morning, prompting him to bring him to the hospital.  Over the last few days, he has had low energy, severe cough and then was so weak that he couldn't stand.  He slept in the chair overnight and kept coughing, was coughing up blood and darker sputum.  He was noted to have body aches and headache.  He also had an elevated temperature to 100.1.  On testing in the ED, he was found to have a pleural effusion which was larger than previous Xray on 04/02/22.  CT scan of the chest showed a large hemothorax vs. Complicated parapneumonic effusion.  He was further found to be Influenza A positive.   As concerns his pleural effusion, this was noticed in a pulmonary visit in December, he had an xray, found to have a large pleural effusion.  At that time, he had progressive dyspnea.  Thoracentesis was completed on 03/24/22. ~ 1.6L of fluid removed.  Xray after showed continued effusion.  Updated Xray on 1/16, cannot see results.  He underwent a 2nd thoracentesis on 1/18.  Post thora xray showed continued large effusion.  Work up is ongoing, concern for his renal failure vs. Liver failure causing his recurrent pleural effusion.   Based on lights criteria, transudative effusion is most likely.   ED Course: In the ED, he was found to have acute on chronic CKD.  He was found to have an enlarging pleural effusion and also flu A.  He had a CT scan of the chest which showed that the fluid appears to be a hemothorax.  I have requested the EDP perform chest tube placement and  re-call for admission.   Review of Systems: As per HPI otherwise all other systems reviewed and are negative.  Past Medical History:  Diagnosis Date   Arthritis    CHF (congestive heart failure), NYHA class II, acute on chronic, systolic (HCC)    EF 46% 10/319   Coronary artery disease    [facility]   Diabetes mellitus without complication ([place])    Dysplastic nevus 08/26/2020   L flank, moderate atypia   Hyperlipidemia    Hypertension    Melanoma ([facility]) 08/20/2015   Right neck. Superficial spreading, arising in nevus. Tumor thickness 1.50m, Anatomic level IV   Melanoma in situ ([place] 05/22/2020   R lateral neck ant to scar, exc 05/22/20   Numbness in right leg    due to back   Skin cancer    removed l arm   Spinal stenosis    Stented coronary artery 2000   X 1 STENT   Swelling of both lower extremities     Past Surgical History:  Procedure Laterality Date   CATARACT EXTRACTION W/PHACO Right 05/03/2019   Procedure: CATARACT EXTRACTION PHACO AND INTRAOCULAR LENS PLACEMENT (IOC) RIGHT DIABETIC 6.44 00:58.4 11.0%;  Surgeon: BLeandrew Koyanagi MD;  [facility] [facility]  Service: Ophthalmology;  Laterality: Right;  DIABETIC   CATARACT EXTRACTION W/PHACO Left 06/28/2019   Procedure: CATARACT EXTRACTION PHACO AND INTRAOCULAR LENS PLACEMENT (IOC) LEFT DIABETIC 7.99  01:13.4  10.9% ;  Surgeon: Leandrew Koyanagi, MD;  Location: [place];  Service: Ophthalmology;  Laterality: Left;  Diabetic - insulin and oral meds   CHOLECYSTECTOMY     JOINT REPLACEMENT     RT KNEE   LUMBAR LAMINECTOMY/DECOMPRESSION MICRODISCECTOMY  04/13/2012   Procedure: LUMBAR LAMINECTOMY/DECOMPRESSION MICRODISCECTOMY 1 LEVEL;  Surgeon: Johnn Hai, MD;  Location: [facility];  Service: Orthopedics;  Laterality: Bilateral;  L4-L5   UVULECTOMY  1990's    Social History  reports that he has never smoked. He has never used smokeless tobacco. He reports current alcohol use. He  reports that he does not use drugs.  Allergies  Allergen Reactions   Metoprolol Other (See Comments)    Cant remember  Cant remember  Cant remember  Cant remember   Toprol Xl [Metoprolol Tartrate] Other (See Comments)    Cant remember   Buprenorphine Itching and Rash   Buprenorphine Hcl Itching and Rash   Morphine Itching and Other (See Comments)   Morphine And Related Itching, Rash and Other (See Comments)   Triamcinolone Rash   Triamcinolone Acetonide Rash    History reviewed. No pertinent family history.  Prior to Admission medications   Medication Sig Start Date End Date Taking? Authorizing Provider  amLODipine (NORVASC) 5 MG tablet Take 5 mg by mouth daily. 08/15/19   [provider]  aspirin 325 MG tablet Take 1 tablet (325 mg total) by mouth daily. 04/17/12   Cecilie Kicks, PA-C  atorvastatin (LIPITOR) 80 MG tablet Take 80 mg by mouth daily. 09/11/19   [provider]  Continuous Blood Gluc Receiver (DEXCOM G6 RECEIVER) DEVI Use to monitor blood sugar. [place] [id number] 11/15/19   [provider]  Continuous Blood Gluc Sensor (DEXCOM G6 SENSOR) MISC Use to monitor blood sugar.  Replace every 10 days. [place] [id number]. 11/15/19   [provider]  Continuous Blood Gluc Transmit (DEXCOM G6 TRANSMITTER) MISC Use to monitor blood sugar.  Replace every 3 months. [place] [id number]. 11/15/19   [provider]  FEROSUL 325 (65 Fe) MG tablet Take 1 tablet (325 mg total) by mouth 2 (two) times daily. 05/05/21   Lloyd Huger, MD  ferrous sulfate (FEROSUL) 325 (65 FE) MG tablet Take by mouth. 10/26/20   [provider]  Torsemide                     insulin aspart (NOVOLOG) 100 UNIT/ML injection Inject into the skin 3 (three) times daily before meals.    [provider]  insulin glargine (LANTUS) 100 UNIT/ML injection Inject 34 Units into the skin daily.    [provider]  Insulin Pen Needle (COMFORT EZ PEN  NEEDLES) 32G X 8 MM MISC See admin instructions. 05/21/20   [provider]         JARDIANCE 10 MG TABS tablet  08/01/21   [provider]  LANTUS SOLOSTAR 100 UNIT/ML Solostar Pen SMARTSIG:44 Unit(s) SUB-Q Every Night 10/12/19   [provider]         NOVOLOG FLEXPEN 100 UNIT/ML FlexPen INJ UP TO 50 UNI D [place] IN MULTIPLE INJECTIONS B MEALS UTD 11/27/21   [provider]  spironolactone (ALDACTONE) 25 MG tablet  06/06/21   [provider]  urea (CARMOL) 10 % cream Apply topically. Patient not taking: Reported on 02/19/2022 01/13/22 01/13/23  [provider]  valsartan (DIOVAN) 320 MG tablet  12/23/21   [provider]  Physical Exam: Vitals:   04/11/22 2037 04/11/22 2039 04/11/22 2204  BP:   (!) 115/50  Pulse: (!) 105  (!) 107  Resp: (!) 24  20  Temp: (!) 101.1 F (38.4 C)    TempSrc: Oral    SpO2: 93%  96%  Weight:  (!) 138.7 kg   Height:  6' (1.829 m)     Constitutional: NAD, calm, comfortable, appears fatigued Eyes: lids and conjunctivae normal ENMT: Mucous membranes are moist. Posterior pharynx clear of any exudate or lesions.  Mildly poor dentition Neck: normal, supple Respiratory: He has decreased breath sounds to the apex on the left.  He has some mild crackles at the apex.  He was clear to the bases on the right.  He has no wheezing or rales.  Cardiovascular: RR with occasional irregularity and missed beats.  Elevated HR.  He has chronic skin changes in the LE related to chronic swelling.  He has pitting edema to the knees.  Abdomen: +BS, NT, ND Musculoskeletal: normal tone and bulk for age Skin: heaped up skin and induration to the bilateral LE due to swelling.  He has chronic skin changes in the legs.  Multiple chronic SK's over face and back.  Neurologic: His strength is 5/5 out of ten, naming is intact, speech is intelligible, no facial droop.  Psychiatric: Normal judgment and insight. Alert and oriented x 3.  Normal mood.    Labs on Admission: I have personally reviewed following labs and imaging studies  CBC: Recent Labs  Lab 04/11/22 2033  WBC 5.3  NEUTROABS 4.5  HGB 8.1*  HCT 25.8*  MCV 89.9  PLT 625    Basic Metabolic Panel: Recent Labs  Lab 04/11/22 2033  NA 134*  K 4.2  CL 97*  CO2 22  GLUCOSE 244*  BUN 63*  CREATININE 1.69*  CALCIUM 8.0*    GFR: Estimated Creatinine Clearance: 56.2 mL/min (A) (by C-G formula based on SCr of 1.69 mg/dL (H)).  Liver Function Tests: Recent Labs  Lab 04/11/22 2033  AST 23  ALT 15  ALKPHOS 72  BILITOT 1.4*  PROT 5.9*  ALBUMIN 2.5*    Urine analysis: pending   Radiological Exams on Admission: CT Angio Chest PE W and/or Wo Contrast  Result Date: 04/11/2022 CLINICAL DATA:  Confusion and coughing up blood EXAM: CT ANGIOGRAPHY CHEST WITH CONTRAST TECHNIQUE: Multidetector CT imaging of the chest was performed using the standard protocol during bolus administration of intravenous contrast. Multiplanar CT image reconstructions and MIPs were obtained to evaluate the vascular anatomy. RADIATION DOSE REDUCTION: This exam was performed according to the departmental dose-optimization program which includes automated exposure control, adjustment of the mA and/or kV according to patient size and/or use of iterative reconstruction technique. CONTRAST:  41m OMNIPAQUE IOHEXOL 350 MG/ML SOLN COMPARISON:  CTA chest 10/15/2019 and radiographs earlier today FINDINGS: Cardiovascular: Satisfactory opacification of the pulmonary arteries to the segmental level. No evidence of pulmonary embolism. Hazy hyperdensity layering along the periphery of the left lower lobe. This is indeterminate on single phase exam and could be due to heterogenous perfusion in the setting of pneumonia however is suspicious for active contrast extravasation possibly related to recent thoracentesis and/or AVM as this appears rounded peripherally (circa series 6/image 255-268). No  definite feeding vessel is seen. Cardiomegaly. No pericardial effusion. Coronary artery and aortic atherosclerotic calcification. Mediastinum/Nodes: Shotty mediastinal and hilar lymph nodes have increased since 10/15/2019. The largest is a precarinal node measuring 1.3 cm (4/47). These are favored reactive.  Unremarkable esophagus. Lungs/Pleura: Large left pleural effusion some of which is hyperdense posteriorly suspicious for hemothorax. Hazy ground-glass opacities in the superior segment left lower lobe and low-attenuation consolidation in the inferior left lower lobe. These findings may be due to hemorrhage and/or pneumonia. Secretions/fluid within the left mainstem bronchus. The left lower lobe bronchi are not visualized. Secretions/fluid within the lingular bronchi. Atelectasis/consolidation in the lingula. Mild bronchial wall thickening and clustered centrilobular micro nodularity in the right lung. Part solid and subsolid nodule in the right upper lobe measuring 1.0 cm with the solid component measuring 3 mm (6/66). Upper Abdomen: Cholecystectomy.  No acute abnormality. Musculoskeletal: No chest wall abnormality. No acute osseous findings. Review of the MIP images confirms the above findings. IMPRESSION: 1. Findings concerning for active hemorrhage in the left lower lobe possibly related to recent thoracentesis and/or AVM versus heterogenous perfusion in the setting of pneumonia. 2. Large left hemothorax. 3. Ground-glass and low-attenuation consolidation in the left lower lobe suspicious for pneumonia and/or alveolar hemorrhage. 4. Nonspecific secretions or blood within the left mainstem bronchus left lower lobe bronchi and lingula. 5. Part solid and subsolid nodule in the right upper lobe measuring 1.0 cm. Follow-up non-contrast CT recommended at 3-6 months to confirm persistence. If unchanged, and solid component remains <6 mm, annual CT is recommended until 5 years of stability has been established. If  persistent these nodules should be considered highly suspicious if the solid component of the nodule is 6 mm or greater in size and enlarging. This recommendation follows the consensus statement: Guidelines for Management of Incidental Pulmonary Nodules Detected on CT Images: From the Fleischner Society 2017; Radiology 2017; 284:228-243. Critical Value/emergent results were called by telephone at the time of interpretation on 04/11/2022 at 10:34 pm to provider PHILLIP STAFFORD , who verbally acknowledged these results. Electronically Signed   By: Placido Sou M.D.   On: 04/11/2022 22:50   DG Chest Portable 1 View  Result Date: 04/11/2022 CLINICAL DATA:  Shortness of breath and decreased left breath sounds EXAM: PORTABLE CHEST 1 VIEW COMPARISON:  Radiograph 04/02/2022 FINDINGS: Similar to increased large left pleural effusion and associated atelectasis/consolidation. The right lung is clear. No pneumothorax. Stable partially obscured cardiomediastinal silhouette. Aortic atherosclerotic calcification. IMPRESSION: Slightly increased large left pleural effusion and associated atelectasis/consolidation. Electronically Signed   By: Placido Sou M.D.   On: 04/11/2022 20:51    EKG: pending  Assessment/Plan Influenza A Hypoxic respiratory failure (requiring 2LNC) Hemothorax - Chest tube by EDP -- need repeat Xray and reassessment after tube in place - Tamiflu - Oxygen as needed to keep saturation > 90% - Rocephin/Azithro given for CAP coverage - Pulmonary consult will be needed in the AM for CT management  AKI on CKD stage 2 - IVF with NS at 100cc/hr for 10 hours, then stop and reassess volume - Trend BMET - Reviewed nephrology notes and he is being set up for rituxan infusions.  Consider nephrology consult if needed  DM2 with neuropathy and hyperglycemia - Insulin, long acting at half home dose, 20 units - SSI    Acute on chronic anemia Iron deficiency anemia Related to hemothorax, ABLA -  Trend H/H - He is on iron infusions in the outpatient setting - Blood transfusions as needed to keep Hgb > 7.5 - 8.0  CAD (coronary artery disease) Type 2 NSTEMI -- related to hemothorax, blood loss, tachycardia Elevated troponin - Troponin flat at 700 - EKG pending - Fluids as noted above    Essential (primary)  hypertension    Hyperlipidemia, mixed  Edema of lower extremity CHF (congestive heart failure), NYHA class II, chronic, systolic (HCC)   DVT prophylaxis: SCDs - current hemothorax  Code Status:   Full, discussed with patient and son at length  Family Communication:  Son at bedside  Disposition Plan:   Patient is from:  Home  Anticipated DC to:  Home, possibly ALF  Anticipated DC date:  Unknown  Anticipated DC barriers: Chest tube, influenza, debility  Consults called:  Will need pulmonary in the AM  Admission status:  IP, progressive   Severity of Illness: The appropriate patient status for this patient is INPATIENT. Inpatient status is judged to be reasonable and necessary in order to provide the required intensity of service to ensure the patient's safety. The patient's presenting symptoms, physical exam findings, and initial radiographic and laboratory data in the context of their chronic comorbidities is felt to place them at high risk for further clinical deterioration. Furthermore, it is not anticipated that the patient will be medically stable for discharge from the hospital within 2 midnights of admission.   * I certify that at the point of admission it is my clinical judgment that the patient will require inpatient hospital care spanning beyond 2 midnights from the point of admission due to high intensity of service, high risk for further deterioration and high frequency of surveillance required.Gilles Chiquito MD [facility]  How to contact the [facility] Attending or Consulting provider [place] or covering provider during after hours [place], for this  patient?   Check the care team in [facility] and look for a) attending/consulting [facility] provider listed and b) the [facility] team listed Log into [email or web address] and use [place]'s universal password to access. If you do not have the password, please contact the hospital operator. Locate the [facility] provider you are looking for under [facility] and page to a number that you can be directly reached. If you still have difficulty reaching the provider, please page the [facility] (Director on Call) for the Hospitalists listed on amion for assistance.  04/11/2022, 11:32 PM

## 2022-04-11 NOTE — ED Notes (Signed)
Pt placed on the cardiac monitor. Vitals stable on RA. Pt a little tachy at 105 bpm. Per EMS, pts last well known time was yesterday morning at his doctors appt. He is alert and oriented to self, place, and situation. He is confused on what year it is. CGB is WNL. Son at bedside said he noticed his confusion this morning.

## 2022-04-11 NOTE — Code Documentation (Signed)
CODE SEPSIS - PHARMACY COMMUNICATION  **Broad Spectrum Antibiotics should be administered within 1 hour of Sepsis diagnosis**  Time Code Sepsis Called/Page Received: 2119  Antibiotics Ordered: Azithromycin and Ceftriaxone  Time of 1st antibiotic administration: 2151    Darrick Penna ,PharmD Clinical Pharmacist  04/11/2022  9:26 PM

## 2022-04-12 ENCOUNTER — Inpatient Hospital Stay
Admit: 2022-04-12 | Discharge: 2022-04-12 | Disposition: A | Discharge status: Home / Self Care | Payer: Medicare HMO | Attending: Internal Medicine | Admitting: Internal Medicine

## 2022-04-12 ENCOUNTER — Inpatient Hospital Stay: Payer: Medicare HMO

## 2022-04-12 ENCOUNTER — Emergency Department: Payer: Medicare HMO

## 2022-04-12 DIAGNOSIS — I5033 Acute on chronic diastolic (congestive) heart failure: Secondary | ICD-10-CM | POA: Diagnosis not present

## 2022-04-12 DIAGNOSIS — N182 Chronic kidney disease, stage 2 (mild): Secondary | ICD-10-CM | POA: Diagnosis not present

## 2022-04-12 DIAGNOSIS — I10 Essential (primary) hypertension: Secondary | ICD-10-CM | POA: Diagnosis not present

## 2022-04-12 DIAGNOSIS — Z1152 Encounter for screening for COVID-19: Secondary | ICD-10-CM | POA: Diagnosis not present

## 2022-04-12 DIAGNOSIS — R042 Hemoptysis: Secondary | ICD-10-CM | POA: Diagnosis present

## 2022-04-12 DIAGNOSIS — J90 Pleural effusion, not elsewhere classified: Secondary | ICD-10-CM | POA: Insufficient documentation

## 2022-04-12 DIAGNOSIS — I959 Hypotension, unspecified: Secondary | ICD-10-CM | POA: Diagnosis present

## 2022-04-12 DIAGNOSIS — J9811 Atelectasis: Secondary | ICD-10-CM | POA: Diagnosis not present

## 2022-04-12 DIAGNOSIS — R601 Generalized edema: Secondary | ICD-10-CM | POA: Diagnosis not present

## 2022-04-12 DIAGNOSIS — I5082 Biventricular heart failure: Secondary | ICD-10-CM | POA: Diagnosis present

## 2022-04-12 DIAGNOSIS — I509 Heart failure, unspecified: Secondary | ICD-10-CM | POA: Diagnosis not present

## 2022-04-12 DIAGNOSIS — I429 Cardiomyopathy, unspecified: Secondary | ICD-10-CM | POA: Diagnosis present

## 2022-04-12 DIAGNOSIS — Z794 Long term (current) use of insulin: Secondary | ICD-10-CM | POA: Diagnosis not present

## 2022-04-12 DIAGNOSIS — J101 Influenza due to other identified influenza virus with other respiratory manifestations: Secondary | ICD-10-CM | POA: Diagnosis not present

## 2022-04-12 DIAGNOSIS — N179 Acute kidney failure, unspecified: Secondary | ICD-10-CM | POA: Diagnosis present

## 2022-04-12 DIAGNOSIS — R600 Localized edema: Secondary | ICD-10-CM | POA: Diagnosis not present

## 2022-04-12 DIAGNOSIS — I130 Hypertensive heart and chronic kidney disease with heart failure and stage 1 through stage 4 chronic kidney disease, or unspecified chronic kidney disease: Secondary | ICD-10-CM | POA: Diagnosis present

## 2022-04-12 DIAGNOSIS — Z6841 Body Mass Index (BMI) 40.0 and over, adult: Secondary | ICD-10-CM | POA: Diagnosis not present

## 2022-04-12 DIAGNOSIS — R809 Proteinuria, unspecified: Secondary | ICD-10-CM | POA: Diagnosis not present

## 2022-04-12 DIAGNOSIS — E1122 Type 2 diabetes mellitus with diabetic chronic kidney disease: Secondary | ICD-10-CM | POA: Diagnosis present

## 2022-04-12 DIAGNOSIS — J9621 Acute and chronic respiratory failure with hypoxia: Secondary | ICD-10-CM | POA: Diagnosis not present

## 2022-04-12 DIAGNOSIS — I2489 Other forms of acute ischemic heart disease: Secondary | ICD-10-CM | POA: Diagnosis not present

## 2022-04-12 DIAGNOSIS — I5043 Acute on chronic combined systolic (congestive) and diastolic (congestive) heart failure: Secondary | ICD-10-CM | POA: Diagnosis present

## 2022-04-12 DIAGNOSIS — J449 Chronic obstructive pulmonary disease, unspecified: Secondary | ICD-10-CM | POA: Diagnosis present

## 2022-04-12 DIAGNOSIS — E1165 Type 2 diabetes mellitus with hyperglycemia: Secondary | ICD-10-CM | POA: Diagnosis present

## 2022-04-12 DIAGNOSIS — N0429 Other nephrotic syndrome with diffuse membranous glomerulonephritis: Secondary | ICD-10-CM | POA: Diagnosis present

## 2022-04-12 DIAGNOSIS — J9601 Acute respiratory failure with hypoxia: Secondary | ICD-10-CM | POA: Diagnosis not present

## 2022-04-12 DIAGNOSIS — N170 Acute kidney failure with tubular necrosis: Secondary | ICD-10-CM

## 2022-04-12 DIAGNOSIS — E1140 Type 2 diabetes mellitus with diabetic neuropathy, unspecified: Secondary | ICD-10-CM | POA: Diagnosis present

## 2022-04-12 DIAGNOSIS — J439 Emphysema, unspecified: Secondary | ICD-10-CM | POA: Diagnosis not present

## 2022-04-12 DIAGNOSIS — J918 Pleural effusion in other conditions classified elsewhere: Secondary | ICD-10-CM | POA: Diagnosis present

## 2022-04-12 DIAGNOSIS — D631 Anemia in chronic kidney disease: Secondary | ICD-10-CM | POA: Diagnosis not present

## 2022-04-12 DIAGNOSIS — N032 Chronic nephritic syndrome with diffuse membranous glomerulonephritis: Secondary | ICD-10-CM | POA: Diagnosis not present

## 2022-04-12 DIAGNOSIS — N052 Unspecified nephritic syndrome with diffuse membranous glomerulonephritis: Secondary | ICD-10-CM | POA: Diagnosis not present

## 2022-04-12 DIAGNOSIS — N183 Chronic kidney disease, stage 3 unspecified: Secondary | ICD-10-CM | POA: Diagnosis present

## 2022-04-12 DIAGNOSIS — I5020 Unspecified systolic (congestive) heart failure: Secondary | ICD-10-CM | POA: Diagnosis not present

## 2022-04-12 DIAGNOSIS — K7460 Unspecified cirrhosis of liver: Secondary | ICD-10-CM | POA: Diagnosis not present

## 2022-04-12 DIAGNOSIS — R911 Solitary pulmonary nodule: Secondary | ICD-10-CM | POA: Diagnosis not present

## 2022-04-12 DIAGNOSIS — E871 Hypo-osmolality and hyponatremia: Secondary | ICD-10-CM | POA: Diagnosis present

## 2022-04-12 DIAGNOSIS — J939 Pneumothorax, unspecified: Secondary | ICD-10-CM | POA: Diagnosis not present

## 2022-04-12 DIAGNOSIS — I2510 Atherosclerotic heart disease of native coronary artery without angina pectoris: Secondary | ICD-10-CM | POA: Diagnosis not present

## 2022-04-12 DIAGNOSIS — I5023 Acute on chronic systolic (congestive) heart failure: Secondary | ICD-10-CM | POA: Diagnosis not present

## 2022-04-12 LAB — CBG MONITORING, ED
Glucose-Capillary: 234 mg/dL — ABNORMAL HIGH (ref 70–99)
Glucose-Capillary: 222 mg/dL — ABNORMAL HIGH (ref 70–99)
Glucose-Capillary: 188 mg/dL — ABNORMAL HIGH (ref 70–99)
Glucose-Capillary: 178 mg/dL — ABNORMAL HIGH (ref 70–99)

## 2022-04-12 LAB — ECHOCARDIOGRAM COMPLETE
AR max vel: 2.99 cm2
AV Peak grad: 3 mmHg
Ao pk vel: 0.87 m/s
Area-P 1/2: 6.9 cm2
Height: 72 in
S' Lateral: 4.6 cm
Single Plane A4C EF: 31.1 %
Weight: 4892.45 oz

## 2022-04-12 LAB — PROCALCITONIN: Procalcitonin: 0.31 ng/mL

## 2022-04-12 LAB — COMPREHENSIVE METABOLIC PANEL
ALT: 14 U/L (ref 0–44)
AST: 24 U/L (ref 15–41)
Albumin: 2.4 g/dL — ABNORMAL LOW (ref 3.5–5.0)
Alkaline Phosphatase: 60 U/L (ref 38–126)
Anion gap: 11 (ref 5–15)
BUN: 69 mg/dL — ABNORMAL HIGH (ref 8–23)
CO2: 24 mmol/L (ref 22–32)
Calcium: 7.9 mg/dL — ABNORMAL LOW (ref 8.9–10.3)
Chloride: 98 mmol/L (ref 98–111)
Creatinine, Ser: 1.92 mg/dL — ABNORMAL HIGH (ref 0.61–1.24)
GFR, Estimated: 36 mL/min — ABNORMAL LOW (ref >60)
Glucose, Bld: 224 mg/dL — ABNORMAL HIGH (ref 70–99)
Potassium: 4.1 mmol/L (ref 3.5–5.1)
Sodium: 133 mmol/L — ABNORMAL LOW (ref 135–145)
Total Bilirubin: 1 mg/dL (ref 0.3–1.2)
Total Protein: 5.8 g/dL — ABNORMAL LOW (ref 6.5–8.1)

## 2022-04-12 LAB — CBC
HCT: 26 % — ABNORMAL LOW (ref 39.0–52.0)
Hemoglobin: 8 g/dL — ABNORMAL LOW (ref 13.0–17.0)
MCH: 28.5 pg (ref 26.0–34.0)
MCHC: 30.8 g/dL (ref 30.0–36.0)
MCV: 92.5 fL (ref 80.0–100.0)
Platelets: 189 10*3/uL (ref 150–400)
RBC: 2.81 MIL/uL — ABNORMAL LOW (ref 4.22–5.81)
RDW: 15.3 % (ref 11.5–15.5)
WBC: 5.5 10*3/uL (ref 4.0–10.5)
nRBC: 0 % (ref 0.0–0.2)

## 2022-04-12 LAB — HEMOGLOBIN A1C
Hgb A1c MFr Bld: 5.9 % — ABNORMAL HIGH (ref 4.8–5.6)
Mean Plasma Glucose: 122.63 mg/dL

## 2022-04-12 LAB — TROPONIN I (HIGH SENSITIVITY): Troponin I (High Sensitivity): 1039 ng/L (ref <18)

## 2022-04-12 LAB — VITAMIN B12: Vitamin B-12: 846 pg/mL (ref 180–914)

## 2022-04-12 MED ORDER — ATORVASTATIN CALCIUM 80 MG PO TABS
80.0000 mg | ORAL_TABLET | Freq: Every day | ORAL | Status: DC
  Administered 2022-04-12 – 2022-04-26 (×15): 80 mg via ORAL
  Filled 2022-04-12 (×3): qty 1
  Filled 2022-04-12: qty 4
  Filled 2022-04-12 (×9): qty 1
  Filled 2022-04-12: qty 4
  Filled 2022-04-12: qty 1

## 2022-04-12 MED ORDER — INSULIN ASPART 100 UNIT/ML IJ SOLN
0.0000 [IU] | Freq: Three times a day (TID) | INTRAMUSCULAR | Status: DC
  Administered 2022-04-12: 5 [IU] via SUBCUTANEOUS
  Administered 2022-04-12: 3 [IU] via SUBCUTANEOUS
  Administered 2022-04-12 – 2022-04-13 (×2): 5 [IU] via SUBCUTANEOUS
  Administered 2022-04-13: 4 [IU] via SUBCUTANEOUS
  Administered 2022-04-13 – 2022-04-14 (×3): 3 [IU] via SUBCUTANEOUS
  Administered 2022-04-14: 2 [IU] via SUBCUTANEOUS
  Administered 2022-04-15 – 2022-04-16 (×5): 3 [IU] via SUBCUTANEOUS
  Administered 2022-04-17: 5 [IU] via SUBCUTANEOUS
  Administered 2022-04-17 – 2022-04-18 (×4): 3 [IU] via SUBCUTANEOUS
  Administered 2022-04-19 (×3): 5 [IU] via SUBCUTANEOUS
  Administered 2022-04-20 (×2): 8 [IU] via SUBCUTANEOUS
  Administered 2022-04-20 – 2022-04-21 (×2): 3 [IU] via SUBCUTANEOUS
  Filled 2022-04-12 (×23): qty 1

## 2022-04-12 MED ORDER — OSELTAMIVIR PHOSPHATE 30 MG PO CAPS
30.0000 mg | ORAL_CAPSULE | Freq: Every day | ORAL | Status: AC
  Administered 2022-04-12 – 2022-04-16 (×5): 30 mg via ORAL
  Filled 2022-04-12 (×5): qty 1

## 2022-04-12 MED ORDER — ENOXAPARIN SODIUM 80 MG/0.8ML IJ SOSY
0.5000 mg/kg | PREFILLED_SYRINGE | INTRAMUSCULAR | Status: DC
  Administered 2022-04-12 – 2022-04-20 (×9): 70 mg via SUBCUTANEOUS
  Filled 2022-04-12: qty 0.7
  Filled 2022-04-12: qty 0.8
  Filled 2022-04-12: qty 0.7
  Filled 2022-04-12 (×6): qty 0.8

## 2022-04-12 MED ORDER — METHOCARBAMOL 1000 MG/10ML IJ SOLN: 500.0000 mg | Freq: Four times a day (QID) | INTRAVENOUS | Status: DC | PRN

## 2022-04-12 MED ORDER — INSULIN GLARGINE-YFGN 100 UNIT/ML ~~LOC~~ SOLN
15.0000 [IU] | Freq: Every day | SUBCUTANEOUS | Status: DC
  Administered 2022-04-12 (×2): 15 [IU] via SUBCUTANEOUS
  Filled 2022-04-12 (×3): qty 0.15

## 2022-04-12 MED ORDER — AMLODIPINE BESYLATE 5 MG PO TABS
5.0000 mg | ORAL_TABLET | Freq: Every day | ORAL | Status: DC
  Administered 2022-04-12: 5 mg via ORAL
  Filled 2022-04-12: qty 1

## 2022-04-12 MED ORDER — ACETAMINOPHEN 325 MG PO TABS: 650.0000 mg | ORAL_TABLET | Freq: Four times a day (QID) | ORAL | Status: DC | PRN

## 2022-04-12 MED ORDER — ONDANSETRON HCL 4 MG/2ML IJ SOLN
4.0000 mg | Freq: Four times a day (QID) | INTRAMUSCULAR | Status: DC | PRN
  Administered 2022-04-15 – 2022-04-23 (×4): 4 mg via INTRAVENOUS
  Filled 2022-04-12 (×5): qty 2

## 2022-04-12 MED ORDER — HYDROMORPHONE HCL 1 MG/ML IJ SOLN: 0.5000 mg | Freq: Once | INTRAMUSCULAR | Status: DC

## 2022-04-12 MED ORDER — SODIUM CHLORIDE 0.9 % IV SOLN: INTRAVENOUS | Status: DC

## 2022-04-12 MED ORDER — ONDANSETRON HCL 4 MG PO TABS: 4.0000 mg | ORAL_TABLET | Freq: Four times a day (QID) | ORAL | Status: DC | PRN

## 2022-04-12 MED ORDER — HYDROMORPHONE HCL 1 MG/ML IJ SOLN
0.5000 mg | Freq: Once | INTRAMUSCULAR | Status: AC
  Administered 2022-04-12: 0.5 mg via INTRAVENOUS
  Filled 2022-04-12: qty 0.5

## 2022-04-12 MED ORDER — OXYCODONE HCL 5 MG PO TABS
5.0000 mg | ORAL_TABLET | ORAL | Status: DC | PRN
  Administered 2022-04-12 – 2022-04-20 (×8): 5 mg via ORAL
  Filled 2022-04-12 (×8): qty 1

## 2022-04-12 MED ORDER — CARVEDILOL 6.25 MG PO TABS: 6.2500 mg | ORAL_TABLET | Freq: Two times a day (BID) | ORAL | Status: DC

## 2022-04-12 MED ORDER — AZITHROMYCIN 500 MG PO TABS
500.0000 mg | ORAL_TABLET | Freq: Every day | ORAL | Status: DC
  Administered 2022-04-12: 500 mg via ORAL
  Filled 2022-04-12: qty 1

## 2022-04-12 MED ORDER — SODIUM CHLORIDE 0.9 % IV SOLN: 2.0000 g | INTRAVENOUS | Status: DC

## 2022-04-12 MED ORDER — HYDROMORPHONE HCL 1 MG/ML IJ SOLN
0.5000 mg | INTRAMUSCULAR | Status: DC | PRN
  Administered 2022-04-12 – 2022-04-14 (×3): 1 mg via INTRAVENOUS
  Filled 2022-04-12 (×3): qty 1

## 2022-04-12 MED ORDER — FUROSEMIDE 10 MG/ML IJ SOLN
4.0000 mg/h | INTRAVENOUS | Status: DC
  Administered 2022-04-12 – 2022-04-15 (×3): 4 mg/h via INTRAVENOUS
  Filled 2022-04-12 (×3): qty 20

## 2022-04-12 MED ORDER — SODIUM CHLORIDE 0.9% FLUSH
3.0000 mL | Freq: Two times a day (BID) | INTRAVENOUS | Status: DC
  Administered 2022-04-12 – 2022-04-26 (×28): 3 mL via INTRAVENOUS

## 2022-04-12 MED ORDER — ACETAMINOPHEN 650 MG RE SUPP: 650.0000 mg | Freq: Four times a day (QID) | RECTAL | Status: DC | PRN

## 2022-04-12 MED ORDER — INSULIN ASPART 100 UNIT/ML IJ SOLN
0.0000 [IU] | Freq: Every day | INTRAMUSCULAR | Status: DC
  Administered 2022-04-12 – 2022-04-17 (×3): 2 [IU] via SUBCUTANEOUS
  Administered 2022-04-18: 4 [IU] via SUBCUTANEOUS
  Administered 2022-04-19: 2 [IU] via SUBCUTANEOUS
  Administered 2022-04-20: 4 [IU] via SUBCUTANEOUS
  Filled 2022-04-12 (×5): qty 1

## 2022-04-12 MED ORDER — FUROSEMIDE 40 MG PO TABS
80.0000 mg | ORAL_TABLET | Freq: Every day | ORAL | Status: DC
  Administered 2022-04-12: 80 mg via ORAL
  Filled 2022-04-12: qty 2

## 2022-04-12 MED ORDER — IRBESARTAN 150 MG PO TABS
150.0000 mg | ORAL_TABLET | Freq: Every day | ORAL | Status: DC
  Filled 2022-04-12: qty 1

## 2022-04-12 NOTE — ED Notes (Signed)
Pt returned from CT °

## 2022-04-12 NOTE — Consult Note (Signed)
CARDIOLOGY CONSULT NOTE               Patient ID: ASHLEIGH ARYA MRN: [id number] DOB/AGE: [birth year] 74 y.o.  Admit date: 04/11/2022 Referring Physician Dr. Gilles Chiquito hospitalist Primary Physician Dr. Maryln Manuel primary Primary Cardiologist  [facility] clinic Reason for Consultation shortness of breath cough congestion  HPI: Patient is 72 year old obese white male history of multiple medical problems chronic congestive heart failure coronary disease diabetes hypertension hyperlipidemia anemia history of melanoma morbid obesity lower extremity edema states became more confused came to the emergency room had low energy cough congestion was found to be influenza A positive he has somnolence lethargy weakness and fatigue.  And a low-grade temperature he has had some shortness of breath found to have pleural effusion on x-ray which is larger than had been out a week ago chest tube was placed.  To be a large hemothorax may have been a complication of parapneumonic effusion  from influenza A infection denies any chest pain requiring supplemental oxygen because of hypoxemia  Review of systems complete and found to be negative unless listed above     Past Medical History:  Diagnosis Date   Arthritis    CHF (congestive heart failure), NYHA class II, acute on chronic, systolic (HCC)    EF 98% 05/3823   Coronary artery disease    [facility]   Diabetes mellitus without complication ([place])    Dysplastic nevus 08/26/2020   L flank, moderate atypia   Hyperlipidemia    Hypertension    Melanoma ([place]) 08/20/2015   Right neck. Superficial spreading, arising in nevus. Tumor thickness 1.87m, Anatomic level IV   [place]   R lateral neck ant to scar, exc 05/22/20   Numbness in right leg    due to back   Skin cancer    removed l arm   Spinal stenosis    Stented coronary artery 2000   X 1 STENT   Swelling of both lower extremities     Past  Surgical History:  Procedure Laterality Date   CATARACT EXTRACTION W/PHACO Right 05/03/2019   Procedure: CATARACT EXTRACTION PHACO AND INTRAOCULAR LENS PLACEMENT (IOC) RIGHT DIABETIC 6.44 00:58.4 11.0%;  Surgeon: BLeandrew Koyanagi MD;  [place] [facility]  Service: Ophthalmology;  Laterality: Right;  DIABETIC   CATARACT EXTRACTION W/PHACO Left 06/28/2019   Procedure: CATARACT EXTRACTION PHACO AND INTRAOCULAR LENS PLACEMENT (IOC) LEFT DIABETIC 7.99  01:13.4  10.9% ;  Surgeon: BLeandrew Koyanagi MD;  [place] [facility]  Service: Ophthalmology;  Laterality: Left;  Diabetic - insulin and oral meds   CHOLECYSTECTOMY     JOINT REPLACEMENT     RT KNEE   LUMBAR LAMINECTOMY/DECOMPRESSION MICRODISCECTOMY  04/13/2012   Procedure: LUMBAR LAMINECTOMY/DECOMPRESSION MICRODISCECTOMY 1 LEVEL;  Surgeon: JJohnn Hai MD;  Location[facility];  Service: Orthopedics;  Laterality: Bilateral;  L4-L5   UVULECTOMY  1990's    (Not in a hospital admission)  Social History   Socioeconomic History   Marital status: Widowed    Spouse name: Not on file   Number of children: Not on file   Years of education: Not on file   Highest education level: Not on file  Occupational History   Not on file  Tobacco Use   Smoking status: Never   Smokeless tobacco: Never  Vaping Use   Vaping Use: Never used  Substance and Sexual Activity   Alcohol use: Yes    Comment: rare   Drug  use: No   Sexual activity: Not on file  Other Topics Concern   Not on file  Social History Narrative   Not on file   Social Determinants of Health   Financial Resource Strain: Not on file  Food Insecurity: No Food Insecurity (02/12/2022)   Hunger Vital Sign    [name]: Never true    [name]: Never true  Transportation Needs: No Transportation Needs (02/12/2022)   PRAPARE - Hydrologist (Medical): No    Lack of  Transportation (Non-Medical): No  Physical Activity: Not on file  Stress: Not on file  Social Connections: Not on file  Intimate Partner Violence: Not on file    History reviewed. No pertinent family history.    Review of systems complete and found to be negative unless listed above      PHYSICAL EXAM  General: Well developed, well nourished, in no acute distress HEENT:  Normocephalic and atramatic Neck:  No JVD.  Lungs: Clear bilaterally to auscultation and percussion.  Chest tube left lung Heart: HRRR . Normal S1 and S2 without gallops or murmurs.  Abdomen: Bowel sounds are positive, abdomen soft and non-tender  Msk:  Back normal, normal gait. Normal strength and tone for age. Extremities: No clubbing, cyanosis or 3+ edema.   Neuro: Alert and oriented X 3. Psych:  Good affect, responds appropriately  Labs:   Lab Results  Component Value Date   WBC 5.5 04/12/2022   HGB 8.0 (L) 04/12/2022   HCT 26.0 (L) 04/12/2022   MCV 92.5 04/12/2022   PLT 189 04/12/2022    Recent Labs  Lab 04/12/22 0500  NA 133*  K 4.1  CL 98  CO2 24  BUN 69*  CREATININE 1.92*  CALCIUM 7.9*  PROT 5.8*  BILITOT 1.0  ALKPHOS 60  ALT 14  AST 24  GLUCOSE 224*   No results found for: "CKTOTAL", "CKMB", "CKMBINDEX", "TROPONINI" No results found for: "CHOL" No results found for: "HDL" No results found for: "LDLCALC" No results found for: "TRIG" No results found for: "CHOLHDL" No results found for: "LDLDIRECT"    Radiology: [facility] Chest Port 1 View  Result Date: 04/12/2022 CLINICAL DATA:  Chest tube in place EXAM: PORTABLE CHEST 1 VIEW COMPARISON:  Chest x-ray April 12, 2022 FINDINGS: A left chest tube is in stable position. Persistent effusion with underlying opacity, similar to mildly worsened in the interval. No pneumothorax. No other changes. IMPRESSION: A left chest tube remains in place. Persistent effusion with underlying opacity, similar to mildly worsened in the interval. No  pneumothorax. Electronically Signed   By: Dorise Bullion III M.D.   On: 04/12/2022 08:19   DG Chest Portable 1 View  Result Date: 04/12/2022 CLINICAL DATA:  Post chest tube placement EXAM: PORTABLE CHEST 1 VIEW COMPARISON:  Radiograph and CT 04/11/2022 FINDINGS: Decreased now small left pleural effusion after left basilar chest tube placement. Decreased left mid and lower lung atelectasis/consolidation. The right lung is clear. No pneumothorax. Stable cardiomediastinal silhouette. Aortic atherosclerotic calcification. IMPRESSION: Decreased now small left pleural effusion after left basilar chest tube placement. Electronically Signed   By: Placido Sou M.D.   On: 04/12/2022 00:31   CT Angio Chest PE W and/or Wo Contrast  Result Date: 04/11/2022 CLINICAL DATA:  Confusion and coughing up blood EXAM: CT ANGIOGRAPHY CHEST WITH CONTRAST TECHNIQUE: Multidetector CT imaging of the chest was performed using  the standard protocol during bolus administration of intravenous contrast. Multiplanar CT image reconstructions and MIPs were obtained to evaluate the vascular anatomy. RADIATION DOSE REDUCTION: This exam was performed according to the departmental dose-optimization program which includes automated exposure control, adjustment of the mA and/or kV according to patient size and/or use of iterative reconstruction technique. CONTRAST:  3m OMNIPAQUE IOHEXOL 350 MG/ML SOLN COMPARISON:  CTA chest 10/15/2019 and radiographs earlier today FINDINGS: Cardiovascular: Satisfactory opacification of the pulmonary arteries to the segmental level. No evidence of pulmonary embolism. Hazy hyperdensity layering along the periphery of the left lower lobe. This is indeterminate on single phase exam and could be due to heterogenous perfusion in the setting of pneumonia however is suspicious for active contrast extravasation possibly related to recent thoracentesis and/or AVM as this appears rounded peripherally (circa series  6/image 255-268). No definite feeding vessel is seen. Cardiomegaly. No pericardial effusion. Coronary artery and aortic atherosclerotic calcification. Mediastinum/Nodes: Shotty mediastinal and hilar lymph nodes have increased since 10/15/2019. The largest is a precarinal node measuring 1.3 cm (4/47). These are favored reactive. Unremarkable esophagus. Lungs/Pleura: Large left pleural effusion some of which is hyperdense posteriorly suspicious for hemothorax. Hazy ground-glass opacities in the superior segment left lower lobe and low-attenuation consolidation in the inferior left lower lobe. These findings may be due to hemorrhage and/or pneumonia. Secretions/fluid within the left mainstem bronchus. The left lower lobe bronchi are not visualized. Secretions/fluid within the lingular bronchi. Atelectasis/consolidation in the lingula. Mild bronchial wall thickening and clustered centrilobular micro nodularity in the right lung. Part solid and subsolid nodule in the right upper lobe measuring 1.0 cm with the solid component measuring 3 mm (6/66). Upper Abdomen: Cholecystectomy.  No acute abnormality. Musculoskeletal: No chest wall abnormality. No acute osseous findings. Review of the MIP images confirms the above findings. IMPRESSION: 1. Findings concerning for active hemorrhage in the left lower lobe possibly related to recent thoracentesis and/or AVM versus heterogenous perfusion in the setting of pneumonia. 2. Large left hemothorax. 3. Ground-glass and low-attenuation consolidation in the left lower lobe suspicious for pneumonia and/or alveolar hemorrhage. 4. Nonspecific secretions or blood within the left mainstem bronchus left lower lobe bronchi and lingula. 5. Part solid and subsolid nodule in the right upper lobe measuring 1.0 cm. Follow-up non-contrast CT recommended at 3-6 months to confirm persistence. If unchanged, and solid component remains <6 mm, annual CT is recommended until 5 years of stability has been  established. If persistent these nodules should be considered highly suspicious if the solid component of the nodule is 6 mm or greater in size and enlarging. This recommendation follows the consensus statement: Guidelines for Management of Incidental Pulmonary Nodules Detected on CT Images: From the Fleischner Society 2017; Radiology 2017; 284:228-243. Critical Value/emergent results were called by telephone at the time of interpretation on 04/11/2022 at 10:34 pm to provider PHILLIP STAFFORD , who verbally acknowledged these results. Electronically Signed   By: TPlacido SouM.D.   On: 04/11/2022 22:50   DG Chest Portable 1 View  Result Date: 04/11/2022 CLINICAL DATA:  Shortness of breath and decreased left breath sounds EXAM: PORTABLE CHEST 1 VIEW COMPARISON:  Radiograph 04/02/2022 FINDINGS: Similar to increased large left pleural effusion and associated atelectasis/consolidation. The right lung is clear. No pneumothorax. Stable partially obscured cardiomediastinal silhouette. Aortic atherosclerotic calcification. IMPRESSION: Slightly increased large left pleural effusion and associated atelectasis/consolidation. Electronically Signed   By: TPlacido SouM.D.   On: 04/11/2022 20:51   UKoreaAbdomen Limited RUQ (LIVER/GB)  Result  Date: 04/08/2022 CLINICAL DATA:  Cirrhosis EXAM: ULTRASOUND ABDOMEN LIMITED RIGHT UPPER QUADRANT COMPARISON:  CT abdomen and pelvis April 07, 2011, renal ultrasound January 30, 2022 FINDINGS: Gallbladder: Surgically absent. Common bile duct: Diameter: 5.5 mm Liver: No focal lesion identified. Within normal limits in parenchymal echogenicity. Portal vein is patent on color Doppler imaging with normal direction of blood flow towards the liver. Other: None. IMPRESSION: No acute abnormality. Electronically Signed   By: Abelardo Diesel M.D.   On: 04/08/2022 09:12   DG Chest Port 1 View  Result Date: 04/02/2022 CLINICAL DATA:  status post thora EXAM: PORTABLE CHEST 1 VIEW COMPARISON:   Chest x-ray 03/24/2022. FINDINGS: Moderate to large left pleural effusion and overlying opacities. No visible pneumothorax. Partially obscured cardiomediastinal silhouette. No acute osseous abnormality. Polyarticular degenerative change. IMPRESSION: Moderate to large left pleural effusion and overlying opacities. No visible pneumothorax. Electronically Signed   By: Margaretha Sheffield M.D.   On: 04/02/2022 09:29   US THORACENTESIS ASP PLEURAL SPACE W/IMG GUIDE  Result Date: 04/02/2022 INDICATION: Shortness of breath with recurrent left pleural effusion. EXAM: ULTRASOUND GUIDED LEFT THORACENTESIS MEDICATIONS: Local Lidocaine 1% only. COMPLICATIONS: None immediate. PROCEDURE: An ultrasound guided thoracentesis was thoroughly discussed with the patient and questions answered. The benefits, risks, alternatives and complications were also discussed. The patient understands and wishes to proceed with the procedure. Written consent was obtained. Ultrasound was performed to localize and mark an adequate pocket of fluid in the left chest. The area was then prepped and draped in the normal sterile fashion. 1% Lidocaine was used for local anesthesia. Under ultrasound guidance a 19 gauge, 10-cm, Yueh catheter was introduced. Thoracentesis was performed. The catheter was removed and a dressing applied. FINDINGS: A total of approximately 1.5 L of clear yellow colored fluid was removed. Samples were sent to the laboratory as requested by the clinical team. IMPRESSION: Successful ultrasound guided left thoracentesis yielding 1.5 L of pleural fluid. This exam was performed by Tsosie Billing PA-C, and was supervised and interpreted by Dr. Annamaria Boots. Electronically Signed   By: Jerilynn Mages.  Shick M.D.   On: 04/02/2022 09:09   DG Chest Port 1 View  Result Date: 03/24/2022 CLINICAL DATA:  Post thoracentesis EXAM: PORTABLE CHEST 1 VIEW COMPARISON:  Chest XR, earlier same day and 03/13/2022. CT abdomen pelvis, 02/19/2022. FINDINGS: LEFT cardiac  border is obscured. Aortic arch atherosclerosis. The RIGHT lung is well inflated. Interval decrease of prior large LEFT pleural effusion with small-to-moderate volume residual. LEFT basilar consolidative opacity, likely atelectasis. No pneumothorax. No interval osseous abnormality. IMPRESSION: 1. Post thoracentesis with volume residual LEFT pleural effusion. No pneumothorax. 2. Consolidative LEFT basilar opacity, likely atelectasis. Electronically Signed   By: Michaelle Birks M.D.   On: 03/24/2022 15:45   US THORACENTESIS ASP PLEURAL SPACE W/IMG GUIDE  Result Date: 03/24/2022 INDICATION: Patient with complaint of dyspnea x3 months. During pulmonary visit today, patient was found to have large left pleural effusion. Patient was referred to IR for diagnostic and therapeutic LEFT thoracentesis. EXAM: ULTRASOUND GUIDED DIAGNOSTIC AND THERAPEUTIC LEFT THORACENTESIS MEDICATIONS: 10 mL 1 % lidocaine COMPLICATIONS: None immediate. PROCEDURE: An ultrasound guided thoracentesis was thoroughly discussed with the patient and questions answered. The benefits, risks, alternatives and complications were also discussed. The patient understands and wishes to proceed with the procedure. Written consent was obtained. Ultrasound was performed to localize and mark an adequate pocket of fluid in the LEFT chest. The area was then prepped and draped in the normal sterile fashion. 1% Lidocaine was used for  local anesthesia. Under ultrasound guidance a 6 Fr Safe-T-Centesis catheter was introduced. Thoracentesis was performed. The catheter was removed and a dressing applied. FINDINGS: A total of approximately 1.6 L of clear, yellow fluid was removed. Samples were sent to the laboratory as requested by the clinical team. IMPRESSION: Successful diagnostic and therapeutic LEFT thoracentesis yielding 1.6 L of pleural fluid. Read by: Narda Rutherford, AGNP-BC Electronically Signed   By: Michaelle Birks M.D.   On: 03/24/2022 15:31    EKG: EKG is not  released unavailable  ASSESSMENT AND PLAN:  Acute hypoxic respiratory failure requiring 2 L of oxygen cannula Influenza A acute infection Hemothorax requiring chest tube Acute renal insufficiency Diabetes type 2 Coronary artery disease Chronic anemia Morbid obesity Obstructive sleep apnea Hyperlipidemia Acute on chronic congestive heart failure Lower extremity edema . Agree with admit probably need to be in ICU or stepdown with chest tube in place Recommend continued treatment for per influenza A Modest hydration for renal insufficiency Continue diabetes management and control Repeat echocardiogram for evaluation of left ventricular function wall motion Abnormal oxygen inhalers as necessary for hypoxemia CPAP if necessary for what I suspect is significant obstructive sleep apnea Statin therapy for hyperlipidemia Support stockings elevation for lower extremity edema consider diuretics Elevated troponin probably demand ischemia recommend DVT prophylaxis no direct invasive cardiac procedures scheduled at this stage   Signed: Yolonda Kida MD, 04/12/2022, 11:14 AM

## 2022-04-12 NOTE — Progress Notes (Signed)
  Echocardiogram 2D Echocardiogram has been performed.  Claretta Fraise 04/12/2022, 2:35 PM

## 2022-04-12 NOTE — Progress Notes (Addendum)
Progress Note   Patient: [name] XBD:[id number] DOB: 1951-03-06 DOA: 04/11/2022     0 DOS: the patient was seen and examined on 04/12/2022   Brief hospital course: ANDRAS GRUNEWALD is a 72 y.o. male with medical history significant of arthritis, CHF (chronic), CAD, DM2, HLD, HTN, h/o treated melanoma, spinal stenosis who presents for worsening cough, fatigue and sob. Had low-grade fever, he was found to have positive for influenza A. He had recurrent left-sided pleural effusion, had a thoracentesis x 2 during the last months.  Upon arrival emergency room, chest x-ray showed large left-sided pleural effusion again, chest tube was placed by ED physician. Patient also developed significant hypoxemia with oxygen saturation as low as 76% on room air this morning.  Treated with oxygen.  Assessment and Plan: Acute on chronic combined systolic and diastolic congestive heart failure Acute hypoxemic respiratory failure secondary to volume overload. Elevated troponin with NSTEMI. Coronary artery disease. Patient has significant hypoxemia, oxygen dropped down to 76% when oxygen is off.  Patient has significant short of breath with minimal exertion. Patient also has significant volume overload.  He has evidence of bichamber heart failure.  He has severe bilateral leg edema, evidence of shifting dullness.  Also has significant elevation of BNP. Discussed with nephrology and cardiology, prefer to start furosemide drip. Reviewed patient record, patient had echocardiogram performed outside hospital on 05/2021, ejection fraction at that time was 45%.  Will obtain echocardiogram again.  Patient may also have significant pulm hypertension. Also obtain abdominal ultrasound to rule out liver cirrhosis with ascites. Patient also has significant elevation of troponin, level much higher than normally seen with acute congestive heart failure.  Cardiology consult is obtained.  Influenza A. On Tamiflu.  Recurrent  left side pleural effusion.  Hemothorax ruled out. Chest tube was placed in the emergency room, removed 2 L of serous fluids.  No evidence of bleeding.  Based on prior lab test, cytology was negative.  Prior test also does not indicate infection.  Reviewed chest x-ray post chest tube, no evidence of pneumonia. Procalcitonin level not significant elevated at 0.2 with chronic renal function.  No evidence of infection.  I will discontinue antibiotics.  Acute kidney injury on chronic kidney disease stage II. Hyponatremia. Discontinue IV fluids, patient grossly volume overloaded.  Patient be followed by nephrology. Consider possibility of cardiorenal syndrome.  Iron deficient anemia. Acute blood loss anemia ruled out. Hemoglobin still stable, patient has significant iron deficiency.  I will consider IV iron after diuretics started.  Uncontrolled type 2 diabetes with hyperglycemia Glucose running high, continue current regimen, adjust insulin dose as needed.  Essential hypertension Hold off ARB for now due to acute renal failure. Discontinue amlodipine for systolic chf. Patient could not tolerate beta blocker Patient had some nonspecific reaction to metoprolol, he does not remember.  Will try lower dose Coreg for hypertension as well as for systolic congestive heart failure  Morbid obesity BMI 41.47, Likely obstructive sleep apnea. Diet exercise. Will decide if patient need BiPAP before discharge.      Subjective:  Patient has significant hypoxia when oxygen is off.  He has short of breath with minimal exertion.  He has severe bilateral leg edema.  Physical Exam: Vitals:   04/12/22 0930 04/12/22 0945 04/12/22 1000 04/12/22 1015  BP: 117/65 (!) 118/59 114/71   Pulse: 100 95 99 100  Resp: (!) 21 18 (!) 24 19  Temp:      TempSrc:      SpO2:  92% 94% (!) 88%  Weight:      Height:       General exam: Appears calm and comfortable, morbidly obese. Respiratory system: Decreased  breathing sounds. Respiratory effort normal. Cardiovascular system: S1 & S2 heard, RRR. No JVD, murmurs, rubs, gallops or clicks.  Gastrointestinal system: Abdomen is distended, soft and nontender.  Positive shifting dullness.  No organomegaly or masses felt. Normal bowel sounds heard. Central nervous system: Alert and oriented x2. No focal neurological deficits. Extremities: 4+ bilateral leg edema. Skin: No rashes, lesions or ulcers Psychiatry: Judgement and insight appear normal. Mood & affect appropriate.   Data Reviewed:  Reviewed outside echocardiogram results, reviewed her prior ultrasound, CT scan results.  Reviewed current chest x-ray results, independently viewed x-ray images. Reviewed lab results.  Family Communication: Son updated at bedside.  Disposition: Status is: Inpatient Remains inpatient appropriate because: Severity of disease, IV treatment.  Planned Discharge Destination:  TBD    Time spent: 60 minutes  Author: Sharen Hones, MD 04/12/2022 11:14 AM  For on call review [email or web address].

## 2022-04-12 NOTE — ED Notes (Signed)
Pt's O2 sats 88-92% on 3L. O2 sensor replaced w/ no change, pt O2 upped to 4L.

## 2022-04-12 NOTE — Progress Notes (Signed)
PHARMACIST - PHYSICIAN COMMUNICATION  CONCERNING:  Enoxaparin (Lovenox) for DVT Prophylaxis    RECOMMENDATION: Patient was prescribed enoxaprin '40mg'$  q24 hours for VTE prophylaxis.   Filed Weights   04/11/22 2039  Weight: (!) 138.7 kg (305 lb 12.5 oz)    Body mass index is 41.47 kg/m.  Estimated Creatinine Clearance: 56.2 mL/min (A) (by C-G formula based on SCr of 1.69 mg/dL (H)).   Based on [place] patient is candidate for enoxaparin 0.'5mg'$ /kg TBW SQ every 24 hours based on BMI being >30.  DESCRIPTION: Pharmacy has adjusted enoxaparin dose per [facility] policy.  Patient is now receiving enoxaparin 0.5 mg/kg every 24 hours   Renda Rolls, PharmD, [facility] 04/12/2022 1:00 AM

## 2022-04-12 NOTE — Progress Notes (Signed)
[facility]  ROUNDING NOTE   Subjective:  Patient well-known to [place] as we follow him for idiopathic membranous glomerulonephritis with nephrotic syndrome, chronic kidney disease stage II with diabetes mellitus type 2, proteinuria, and lower extremity edema.  Patient came in with significant shortness of breath.  Found to be positive for the flu.  Has a left-sided chest tube in place.  Baseline creatinine 1.15 with EGFR of 67.  Most recent urine protein to creatinine ratio was down to 4.9.  He was to be evaluated by hematology for treatment with rituximab.   Objective:  Vital signs in last 24 hours:  Temp:  [98.9 F (37.2 C)-101.1 F (38.4 C)] 98.9 F (37.2 C) (01/28 0648) Pulse Rate:  [91-107] 100 (01/28 1015) Resp:  [16-24] 19 (01/28 1015) BP: (97-125)/(50-71) 114/71 (01/28 1000) SpO2:  [88 %-100 %] 88 % (01/28 1015) Weight:  [138.7 kg] 138.7 kg (01/27 2039)  Weight change:  Filed Weights   04/11/22 2039  Weight: (!) 138.7 kg    Intake/Output: I/O last 3 completed shifts: In: 350 [IV Piggyback:350] Out: -    Intake/Output this shift:  Total I/O In: 3 [I.V.:3] Out: -   Physical Exam: General: No acute distress  Head: Normocephalic, atraumatic. Moist oral mucosal membranes  Neck: Supple  Lungs:  Bibasilar rales, normal effort  Heart: S1S2 no rubs  Abdomen:  Soft, nontender, bowel sounds present  Extremities: 2+ brawny peripheral edema.  Neurologic: Awake, alert, following commands  Skin: No acute rash  Access: No hemodialysis access    Basic Metabolic Panel: Recent Labs  Lab 04/11/22 2033 04/12/22 0500  NA 134* 133*  K 4.2 4.1  CL 97* 98  CO2 22 24  GLUCOSE 244* 224*  BUN 63* 69*  CREATININE 1.69* 1.92*  CALCIUM 8.0* 7.9*    Liver Function Tests: Recent Labs  Lab 04/11/22 2033 04/12/22 0500  AST 23 24  ALT 15 14  ALKPHOS 72 60  BILITOT 1.4* 1.0  PROT 5.9* 5.8*  ALBUMIN 2.5* 2.4*   No results for input(s): "LIPASE", "AMYLASE" in  the last 168 hours. No results for input(s): "AMMONIA" in the last 168 hours.  CBC: Recent Labs  Lab 04/11/22 2033 04/12/22 0500  WBC 5.3 5.5  NEUTROABS 4.5  --   HGB 8.1* 8.0*  HCT 25.8* 26.0*  MCV 89.9 92.5  PLT 193 189    Cardiac Enzymes: No results for input(s): "CKTOTAL", "CKMB", "CKMBINDEX", "TROPONINI" in the last 168 hours.  BNP: Invalid input(s): "POCBNP"  CBG: Recent Labs  Lab 04/11/22 2032 04/12/22 0743 04/12/22 1132  GLUCAP 231* 234* 222*    Microbiology: Results for orders placed or performed during the hospital encounter of 04/11/22  Resp panel by RT-PCR (RSV, Flu A&B, Covid) Anterior Nasal Swab     Status: Abnormal   Collection Time: 04/11/22  8:34 PM   Specimen: Anterior Nasal Swab  Result Value Ref Range Status   SARS Coronavirus 2 by RT PCR NEGATIVE NEGATIVE Final    Comment: (NOTE) SARS-CoV-2 target nucleic acids are NOT DETECTED.  The SARS-CoV-2 RNA is generally detectable in upper respiratory specimens during the acute phase of infection. The lowest concentration of SARS-CoV-2 viral copies this assay can detect is 138 copies/mL. A negative result does not preclude SARS-Cov-2 infection and should not be used as the sole basis for treatment or other patient management decisions. A negative result may occur with  improper specimen collection/handling, submission of specimen other than nasopharyngeal swab, presence of viral mutation(s)  within the areas targeted by this assay, and inadequate number of viral copies(<138 copies/mL). A negative result must be combined with clinical observations, patient history, and epidemiological information. The expected result is Negative.  Fact Sheet for Patients:  EntrepreneurPulse.com.au  Fact Sheet for Healthcare Providers:  IncredibleEmployment.be  This test is no t yet approved or cleared by the [place] FDA and  has been authorized for detection and/or  diagnosis of SARS-CoV-2 by FDA under an Emergency Use Authorization (EUA). This EUA will remain  in effect (meaning this test can be used) for the duration of the COVID-19 declaration under Section 564(b)(1) of the Act, 21 U.S.C.section 360bbb-3(b)(1), unless the authorization is terminated  or revoked sooner.       Influenza A by PCR POSITIVE (A) NEGATIVE Final   Influenza B by PCR NEGATIVE NEGATIVE Final    Comment: (NOTE) The Xpert Xpress SARS-CoV-2/FLU/RSV plus assay is intended as an aid in the diagnosis of influenza from Nasopharyngeal swab specimens and should not be used as a sole basis for treatment. Nasal washings and aspirates are unacceptable for Xpert Xpress SARS-CoV-2/FLU/RSV testing.  Fact Sheet for Patients: EntrepreneurPulse.com.au  Fact Sheet for Healthcare Providers: IncredibleEmployment.be  This test is not yet approved or cleared by the [place] FDA and has been authorized for detection and/or diagnosis of SARS-CoV-2 by FDA under an Emergency Use Authorization (EUA). This EUA will remain in effect (meaning this test can be used) for the duration of the COVID-19 declaration under Section 564(b)(1) of the Act, 21 U.S.C. section 360bbb-3(b)(1), unless the authorization is terminated or revoked.     Resp Syncytial Virus by PCR NEGATIVE NEGATIVE Final    Comment: (NOTE) Fact Sheet for Patients: EntrepreneurPulse.com.au  Fact Sheet for Healthcare Providers: IncredibleEmployment.be  This test is not yet approved or cleared by the [place] FDA and has been authorized for detection and/or diagnosis of SARS-CoV-2 by FDA under an Emergency Use Authorization (EUA). This EUA will remain in effect (meaning this test can be used) for the duration of the COVID-19 declaration under Section 564(b)(1) of the Act, 21 U.S.C. section 360bbb-3(b)(1), unless the authorization is terminated  or revoked.  Performed at [facility], [address] [place], [place] [address]   Culture, blood (routine x 2)     Status: None (Preliminary result)   Collection Time: 04/11/22  9:59 PM   Specimen: BLOOD LEFT ARM  Result Value Ref Range Status   Specimen Description BLOOD LEFT ARM  Final   Special Requests   Final    BOTTLES DRAWN AEROBIC AND ANAEROBIC Blood Culture adequate volume   Culture   Final    NO GROWTH < 12 HOURS Performed at [facility], [address] [place], [place] [address]    Report Status PENDING  Incomplete  Culture, blood (routine x 2)     Status: None (Preliminary result)   Collection Time: 04/11/22 10:01 PM   Specimen: BLOOD LEFT HAND  Result Value Ref Range Status   Specimen Description BLOOD LEFT HAND  Final   Special Requests   Final    BOTTLES DRAWN AEROBIC AND ANAEROBIC Blood Culture results may not be optimal due to an inadequate volume of blood received in culture bottles   Culture   Final    NO GROWTH < 12 HOURS Performed at [facility], [address] [place],  [address]    Report Status PENDING  Incomplete    Coagulation Studies: No results for input(s): "LABPROT", "INR"  in the last 72 hours.  Urinalysis: No results for input(s): "COLORURINE", "LABSPEC", "PHURINE", "GLUCOSEU", "HGBUR", "BILIRUBINUR", "KETONESUR", "PROTEINUR", "UROBILINOGEN", "NITRITE", "LEUKOCYTESUR" in the last 72 hours.  Invalid input(s): "APPERANCEUR"    Imaging: DG Chest Port 1 View  Result Date: 04/12/2022 CLINICAL DATA:  Chest tube in place EXAM: PORTABLE CHEST 1 VIEW COMPARISON:  Chest x-ray April 12, 2022 FINDINGS: A left chest tube is in stable position. Persistent effusion with underlying opacity, similar to mildly worsened in the interval. No pneumothorax. No other changes. IMPRESSION: A left chest tube remains in place. Persistent effusion with underlying opacity, similar to mildly worsened in the interval. No  pneumothorax. Electronically Signed   By: Dorise Bullion III M.D.   On: 04/12/2022 08:19   DG Chest Portable 1 View  Result Date: 04/12/2022 CLINICAL DATA:  Post chest tube placement EXAM: PORTABLE CHEST 1 VIEW COMPARISON:  Radiograph and CT 04/11/2022 FINDINGS: Decreased now small left pleural effusion after left basilar chest tube placement. Decreased left mid and lower lung atelectasis/consolidation. The right lung is clear. No pneumothorax. Stable cardiomediastinal silhouette. Aortic atherosclerotic calcification. IMPRESSION: Decreased now small left pleural effusion after left basilar chest tube placement. Electronically Signed   By: Placido Sou M.D.   On: 04/12/2022 00:31   CT Angio Chest PE W and/or Wo Contrast  Result Date: 04/11/2022 CLINICAL DATA:  Confusion and coughing up blood EXAM: CT ANGIOGRAPHY CHEST WITH CONTRAST TECHNIQUE: Multidetector CT imaging of the chest was performed using the standard protocol during bolus administration of intravenous contrast. Multiplanar CT image reconstructions and MIPs were obtained to evaluate the vascular anatomy. RADIATION DOSE REDUCTION: This exam was performed according to the departmental dose-optimization program which includes automated exposure control, adjustment of the mA and/or kV according to patient size and/or use of iterative reconstruction technique. CONTRAST:  36m OMNIPAQUE IOHEXOL 350 MG/ML SOLN COMPARISON:  CTA chest 10/15/2019 and radiographs earlier today FINDINGS: Cardiovascular: Satisfactory opacification of the pulmonary arteries to the segmental level. No evidence of pulmonary embolism. Hazy hyperdensity layering along the periphery of the left lower lobe. This is indeterminate on single phase exam and could be due to heterogenous perfusion in the setting of pneumonia however is suspicious for active contrast extravasation possibly related to recent thoracentesis and/or AVM as this appears rounded peripherally (circa series  6/image [id number]). No definite feeding vessel is seen. Cardiomegaly. No pericardial effusion. Coronary artery and aortic atherosclerotic calcification. Mediastinum/Nodes: Shotty mediastinal and hilar lymph nodes have increased since 10/15/2019. The largest is a precarinal node measuring 1.3 cm (4/47). These are favored reactive. Unremarkable esophagus. Lungs/Pleura: Large left pleural effusion some of which is hyperdense posteriorly suspicious for hemothorax. Hazy ground-glass opacities in the superior segment left lower lobe and low-attenuation consolidation in the inferior left lower lobe. These findings may be due to hemorrhage and/or pneumonia. Secretions/fluid within the left mainstem bronchus. The left lower lobe bronchi are not visualized. Secretions/fluid within the lingular bronchi. Atelectasis/consolidation in the lingula. Mild bronchial wall thickening and clustered centrilobular micro nodularity in the right lung. Part solid and subsolid nodule in the right upper lobe measuring 1.0 cm with the solid component measuring 3 mm (6/66). Upper Abdomen: Cholecystectomy.  No acute abnormality. Musculoskeletal: No chest wall abnormality. No acute osseous findings. Review of the MIP images confirms the above findings. IMPRESSION: 1. Findings concerning for active hemorrhage in the left lower lobe possibly related to recent thoracentesis and/or AVM versus heterogenous perfusion in the setting of pneumonia. 2. Large left hemothorax. 3. Ground-glass and low-attenuation  consolidation in the left lower lobe suspicious for pneumonia and/or alveolar hemorrhage. 4. Nonspecific secretions or blood within the left mainstem bronchus left lower lobe bronchi and lingula. 5. Part solid and subsolid nodule in the right upper lobe measuring 1.0 cm. Follow-up non-contrast CT recommended at 3-6 months to confirm persistence. If unchanged, and solid component remains <6 mm, annual CT is recommended until 5 years of stability has been  established. If persistent these nodules should be considered highly suspicious if the solid component of the nodule is 6 mm or greater in size and enlarging. This recommendation follows the consensus statement: Guidelines for Management of Incidental Pulmonary Nodules Detected on CT Images: From the Fleischner Society 2017; Radiology 2017; 284:228-243. Critical Value/emergent results were called by telephone at the time of interpretation on 04/11/2022 at 10:34 pm to provider PHILLIP STAFFORD , who verbally acknowledged these results. Electronically Signed   By: Placido Sou M.D.   On: 04/11/2022 22:50   DG Chest Portable 1 View  Result Date: 04/11/2022 CLINICAL DATA:  Shortness of breath and decreased left breath sounds EXAM: PORTABLE CHEST 1 VIEW COMPARISON:  Radiograph 04/02/2022 FINDINGS: Similar to increased large left pleural effusion and associated atelectasis/consolidation. The right lung is clear. No pneumothorax. Stable partially obscured cardiomediastinal silhouette. Aortic atherosclerotic calcification. IMPRESSION: Slightly increased large left pleural effusion and associated atelectasis/consolidation. Electronically Signed   By: Placido Sou M.D.   On: 04/11/2022 20:51     Medications:    methocarbamol (ROBAXIN) IV      atorvastatin  80 mg Oral Daily   carvedilol  6.25 mg Oral BID WC   enoxaparin (LOVENOX) injection  0.5 mg/kg Subcutaneous Q24H   furosemide  80 mg Oral Daily    HYDROmorphone (DILAUDID) injection  0.5 mg Intravenous Once   insulin aspart  0-15 Units Subcutaneous TID WC   insulin aspart  0-5 Units Subcutaneous QHS   insulin glargine-yfgn  15 Units Subcutaneous QHS   oseltamivir  30 mg Oral Daily   sodium chloride flush  3 mL Intravenous Q12H   acetaminophen **OR** acetaminophen, HYDROmorphone (DILAUDID) injection, methocarbamol (ROBAXIN) IV, ondansetron **OR** ondansetron (ZOFRAN) IV, oxyCODONE  Assessment/ Plan:  72 y.o. male with past medical history of  diabetes mellitus type 2, morbid obesity, hypertension, coronary artery disease status post stent placement, cardiomyopathy, low testosterone, hyperlipidemia, history of first left toe osteomyelitis with amputation, hydronephrosis, osteoarthritis of the right hip, history of idiopathic membranous glomerulonephritis who presents with increasing shortness of breath.  1.  Idiopathic membranous glomerulonephritis/chronic kidney disease stage II/proteinuria most recent urine protein to creatinine ratio 4.9.  Patient was in the process of being evaluated for rituximab treatment of his idiopathic membranous pyelonephritis.  Now has ongoing shortness of breath.  Has increasing lower extremity edema as well.  Discontinue oral Lasix and transition to IV Lasix drip at 4 mg/h.  Monitor volume status closely as well as renal function.  2.  Anemia of chronic kidney disease.  Hemoglobin currently 8.0.  He will likely need to be evaluated for Retacrit as an outpatient.  Consider blood transfusion for hemoglobin of 7 or less.  3.  Thanks for consultation.   LOS: 0 Billyjoe Go 1/28/202411:42 AM

## 2022-04-12 NOTE — Hospital Course (Addendum)
[name] is a 72 y.o. male with medical history significant of arthritis, CHF (chronic), CAD, DM2, HLD, HTN, h/o treated melanoma, spinal stenosis who presents for worsening cough, fatigue and sob. Had low-grade fever, he was found to have positive for influenza A. He had recurrent left-sided pleural effusion, had a thoracentesis x 2 during the last months.  Upon arrival emergency room, chest x-ray showed large left-sided pleural effusion again, chest tube was placed by ED physician. Patient also developed significant hypoxemia with oxygen saturation as low as 76% on room air this morning.  Treated with oxygen. Patient has severe volume overload with evidence of bichamber heart failure.  Echocardiogram showed EF 45-50% with grade 2 diastolic dysfunction. Pulm valve not visualized.  Furosemide drip started after seen by nephrology.

## 2022-04-12 NOTE — ED Notes (Signed)
Pt called out to be changed after urinating on self. Pt cleaned, gown and linens changed, new chucks placed. Pt then placed on male purewick.

## 2022-04-13 ENCOUNTER — Inpatient Hospital Stay: Payer: Medicare HMO

## 2022-04-13 ENCOUNTER — Encounter: Payer: Self-pay | Admitting: Internal Medicine

## 2022-04-13 DIAGNOSIS — I5023 Acute on chronic systolic (congestive) heart failure: Secondary | ICD-10-CM | POA: Diagnosis not present

## 2022-04-13 DIAGNOSIS — N170 Acute kidney failure with tubular necrosis: Secondary | ICD-10-CM | POA: Diagnosis not present

## 2022-04-13 DIAGNOSIS — J9601 Acute respiratory failure with hypoxia: Secondary | ICD-10-CM | POA: Diagnosis not present

## 2022-04-13 DIAGNOSIS — J101 Influenza due to other identified influenza virus with other respiratory manifestations: Secondary | ICD-10-CM | POA: Diagnosis not present

## 2022-04-13 LAB — CBC
HCT: 23.5 % — ABNORMAL LOW (ref 39.0–52.0)
Hemoglobin: 7.3 g/dL — ABNORMAL LOW (ref 13.0–17.0)
MCH: 28.2 pg (ref 26.0–34.0)
MCHC: 31.1 g/dL (ref 30.0–36.0)
MCV: 90.7 fL (ref 80.0–100.0)
Platelets: 195 10*3/uL (ref 150–400)
RBC: 2.59 MIL/uL — ABNORMAL LOW (ref 4.22–5.81)
RDW: 15.5 % (ref 11.5–15.5)
WBC: 4 10*3/uL (ref 4.0–10.5)
nRBC: 0 % (ref 0.0–0.2)

## 2022-04-13 LAB — BASIC METABOLIC PANEL
Anion gap: 11 (ref 5–15)
BUN: 76 mg/dL — ABNORMAL HIGH (ref 8–23)
CO2: 24 mmol/L (ref 22–32)
Calcium: 7.6 mg/dL — ABNORMAL LOW (ref 8.9–10.3)
Chloride: 97 mmol/L — ABNORMAL LOW (ref 98–111)
Creatinine, Ser: 2.03 mg/dL — ABNORMAL HIGH (ref 0.61–1.24)
GFR, Estimated: 34 mL/min — ABNORMAL LOW (ref >60)
Glucose, Bld: 253 mg/dL — ABNORMAL HIGH (ref 70–99)
Potassium: 4 mmol/L (ref 3.5–5.1)
Sodium: 132 mmol/L — ABNORMAL LOW (ref 135–145)

## 2022-04-13 LAB — C-REACTIVE PROTEIN: CRP: 7.2 mg/dL — ABNORMAL HIGH (ref <1.0)

## 2022-04-13 LAB — GLUCOSE, CAPILLARY
Glucose-Capillary: 154 mg/dL — ABNORMAL HIGH (ref 70–99)
Glucose-Capillary: 212 mg/dL — ABNORMAL HIGH (ref 70–99)

## 2022-04-13 LAB — CBG MONITORING, ED
Glucose-Capillary: 180 mg/dL — ABNORMAL HIGH (ref 70–99)
Glucose-Capillary: 220 mg/dL — ABNORMAL HIGH (ref 70–99)

## 2022-04-13 LAB — LACTIC ACID, PLASMA: Lactic Acid, Venous: 1.2 mmol/L (ref 0.5–1.9)

## 2022-04-13 LAB — MAGNESIUM: Magnesium: 2.2 mg/dL (ref 1.7–2.4)

## 2022-04-13 MED ORDER — INSULIN GLARGINE-YFGN 100 UNIT/ML ~~LOC~~ SOLN
25.0000 [IU] | Freq: Every day | SUBCUTANEOUS | Status: DC
  Administered 2022-04-13 – 2022-04-20 (×8): 25 [IU] via SUBCUTANEOUS
  Filled 2022-04-13 (×9): qty 0.25

## 2022-04-13 MED ORDER — SODIUM CHLORIDE 0.9 % IV SOLN
300.0000 mg | Freq: Once | INTRAVENOUS | Status: AC
  Administered 2022-04-13: 300 mg via INTRAVENOUS
  Filled 2022-04-13: qty 300

## 2022-04-13 MED ORDER — GUAIFENESIN-DM 100-10 MG/5ML PO SYRP
5.0000 mL | ORAL_SOLUTION | ORAL | Status: DC | PRN
  Administered 2022-04-13: 5 mL via ORAL
  Filled 2022-04-13: qty 10

## 2022-04-13 MED ORDER — ALBUMIN HUMAN 25 % IV SOLN
25.0000 g | Freq: Four times a day (QID) | INTRAVENOUS | Status: AC
  Administered 2022-04-13 (×2): 25 g via INTRAVENOUS
  Filled 2022-04-13 (×2): qty 100

## 2022-04-13 NOTE — Progress Notes (Signed)
[facility]  ROUNDING NOTE   Subjective:  Patient well-known to [place] as we follow him for idiopathic membranous glomerulonephritis with nephrotic syndrome, chronic kidney disease stage II with diabetes mellitus type 2, proteinuria, and lower extremity edema.  Patient came in with significant shortness of breath.  Found to be positive for the flu.  Has a left-sided chest tube in place.  Baseline creatinine 1.15 with EGFR of 67.  Most recent urine protein to creatinine ratio was down to 4.9.  He was to be evaluated by hematology for treatment with rituximab.  Patient states that he overall does not feel well.  Continues on Lasix drip.  Good urine output.  Still has significant amount of lower extremity edema.   Objective:  Vital signs in last 24 hours:  Temp:  [97.7 F (36.5 C)-98.7 F (37.1 C)] 97.7 F (36.5 C) (01/29 0900) Pulse Rate:  [85-96] 87 (01/29 1254) Resp:  [13-23] 20 (01/29 1254) BP: (112-137)/(55-65) 112/55 (01/29 1254) SpO2:  [100 %] 100 % (01/29 1254)  Weight change:  Filed Weights   04/11/22 2039  Weight: (!) 138.7 kg    Intake/Output: I/O last 3 completed shifts: In: 353 [I.V.:3; IV Piggyback:350] Out: -    Intake/Output this shift:  Total I/O In: 240 [P.O.:240] Out: 1650 [Urine:150; Chest Tube:1500]  Physical Exam: General: No acute distress  Head: Normocephalic, atraumatic. Moist oral mucosal membranes  Neck: Supple  Lungs:  Bibasilar rales, normal effort  Heart: S1S2 no rubs  Abdomen:  Soft, nontender, bowel sounds present  Extremities: 2+ brawny peripheral edema.  Neurologic: Awake, alert, following commands  Skin: No acute rash    Basic Metabolic Panel: Recent Labs  Lab 04/11/22 2033 04/12/22 0500 04/13/22 0520  NA 134* 133* 132*  K 4.2 4.1 4.0  CL 97* 98 97*  CO2 '22 24 24  '$ GLUCOSE 244* 224* 253*  BUN 63* 69* 76*  CREATININE 1.69* 1.92* 2.03*  CALCIUM 8.0* 7.9* 7.6*  MG  --   --  2.2     Liver Function Tests: Recent  Labs  Lab 04/11/22 2033 04/12/22 0500  AST 23 24  ALT 15 14  ALKPHOS 72 60  BILITOT 1.4* 1.0  PROT 5.9* 5.8*  ALBUMIN 2.5* 2.4*    No results for input(s): "LIPASE", "AMYLASE" in the last 168 hours. No results for input(s): "AMMONIA" in the last 168 hours.  CBC: Recent Labs  Lab 04/11/22 2033 04/12/22 0500 04/13/22 0520  WBC 5.3 5.5 4.0  NEUTROABS 4.5  --   --   HGB 8.1* 8.0* 7.3*  HCT 25.8* 26.0* 23.5*  MCV 89.9 92.5 90.7  PLT 193 189 195     Cardiac Enzymes: No results for input(s): "CKTOTAL", "CKMB", "CKMBINDEX", "TROPONINI" in the last 168 hours.  BNP: Invalid input(s): "POCBNP"  CBG: Recent Labs  Lab 04/12/22 1132 04/12/22 1623 04/12/22 2023 04/13/22 0757 04/13/22 1124  GLUCAP 222* 188* 178* 180* 220*     Microbiology: Results for orders placed or performed during the hospital encounter of 04/11/22  Resp panel by RT-PCR (RSV, Flu A&B, Covid) Anterior Nasal Swab     Status: Abnormal   Collection Time: 04/11/22  8:34 PM   Specimen: Anterior Nasal Swab  Result Value Ref Range Status   SARS Coronavirus 2 by RT PCR NEGATIVE NEGATIVE Final    Comment: (NOTE) SARS-CoV-2 target nucleic acids are NOT DETECTED.  The SARS-CoV-2 RNA is generally detectable in upper respiratory specimens during the acute phase of infection. The lowest concentration  of SARS-CoV-2 viral copies this assay can detect is 138 copies/mL. A negative result does not preclude SARS-Cov-2 infection and should not be used as the sole basis for treatment or other patient management decisions. A negative result may occur with  improper specimen collection/handling, submission of specimen other than nasopharyngeal swab, presence of viral mutation(s) within the areas targeted by this assay, and inadequate number of viral copies(<138 copies/mL). A negative result must be combined with clinical observations, patient history, and epidemiological information. The expected result is  Negative.  Fact Sheet for Patients:  EntrepreneurPulse.com.au  Fact Sheet for Healthcare Providers:  IncredibleEmployment.be  This test is no t yet approved or cleared by the [place] FDA and  has been authorized for detection and/or diagnosis of SARS-CoV-2 by FDA under an Emergency Use Authorization (EUA). This EUA will remain  in effect (meaning this test can be used) for the duration of the COVID-19 declaration under Section 564(b)(1) of the Act, 21 U.S.C.section 360bbb-3(b)(1), unless the authorization is terminated  or revoked sooner.       Influenza A by PCR POSITIVE (A) NEGATIVE Final   Influenza B by PCR NEGATIVE NEGATIVE Final    Comment: (NOTE) The Xpert Xpress SARS-CoV-2/FLU/RSV plus assay is intended as an aid in the diagnosis of influenza from Nasopharyngeal swab specimens and should not be used as a sole basis for treatment. Nasal washings and aspirates are unacceptable for Xpert Xpress SARS-CoV-2/FLU/RSV testing.  Fact Sheet for Patients: EntrepreneurPulse.com.au  Fact Sheet for Healthcare Providers: IncredibleEmployment.be  This test is not yet approved or cleared by the [place] FDA and has been authorized for detection and/or diagnosis of SARS-CoV-2 by FDA under an Emergency Use Authorization (EUA). This EUA will remain in effect (meaning this test can be used) for the duration of the COVID-19 declaration under Section 564(b)(1) of the Act, 21 U.S.C. section 360bbb-3(b)(1), unless the authorization is terminated or revoked.     Resp Syncytial Virus by PCR NEGATIVE NEGATIVE Final    Comment: (NOTE) Fact Sheet for Patients: EntrepreneurPulse.com.au  Fact Sheet for Healthcare Providers: IncredibleEmployment.be  This test is not yet approved or cleared by the [place] FDA and has been authorized for detection and/or diagnosis of  SARS-CoV-2 by FDA under an Emergency Use Authorization (EUA). This EUA will remain in effect (meaning this test can be used) for the duration of the COVID-19 declaration under Section 564(b)(1) of the Act, 21 U.S.C. section 360bbb-3(b)(1), unless the authorization is terminated or revoked.  Performed at [facility], [address] [place], [place] [address]   Culture, blood (routine x 2)     Status: None (Preliminary result)   Collection Time: 04/11/22  9:59 PM   Specimen: BLOOD LEFT ARM  Result Value Ref Range Status   Specimen Description BLOOD LEFT ARM  Final   Special Requests   Final    BOTTLES DRAWN AEROBIC AND ANAEROBIC Blood Culture adequate volume   Culture   Final    NO GROWTH 2 DAYS Performed at [facility], [address] [place], [place] [address]    Report Status PENDING  Incomplete  Culture, blood (routine x 2)     Status: None (Preliminary result)   Collection Time: 04/11/22 10:01 PM   Specimen: BLOOD LEFT HAND  Result Value Ref Range Status   Specimen Description BLOOD LEFT HAND  Final   Special Requests   Final    BOTTLES DRAWN AEROBIC AND ANAEROBIC Blood Culture results may not be optimal due  to an inadequate volume of blood received in culture bottles   Culture   Final    NO GROWTH 2 DAYS Performed at [facility], [address] [place],  [address]    Report Status PENDING  Incomplete    Coagulation Studies: No results for input(s): "LABPROT", "INR" in the last 72 hours.  Urinalysis: No results for input(s): "COLORURINE", "LABSPEC", "PHURINE", "GLUCOSEU", "HGBUR", "BILIRUBINUR", "KETONESUR", "PROTEINUR", "UROBILINOGEN", "NITRITE", "LEUKOCYTESUR" in the last 72 hours.  Invalid input(s): "APPERANCEUR"    Imaging: ECHOCARDIOGRAM COMPLETE  Result Date: 04/12/2022    ECHOCARDIOGRAM REPORT   Patient Name:   ACHILLES NEVILLE Date of Exam: 04/12/2022 Medical Rec #:  [id number]      Height:       72.0 in Accession #:     [id number]     Weight:       305.8 lb Date of Birth:  02/27/1951      BSA:          2.552 m Patient Age:    50 years       BP:           136/99 mmHg Patient Gender: M              HR:           101 bpm. Exam Location:  [facility] Procedure: 2D Echo Indications:     CHF I50.21  History:         Patient has no prior history of Echocardiogram examinations.  Sonographer:     Kathlen Brunswick RDCS Referring Phys:  [id number] Sharen Hones Diagnosing Phys: Yolonda Kida MD  Sonographer Comments: Technically challenging study due to limited acoustic windows, patient is obese and suboptimal apical window. Image acquisition challenging due to patient body habitus. Difficult and challenging study due to left side chest tube and  drain at the time of this study. IMPRESSIONS  1. Left ventricular ejection fraction, by estimation, is 45 to 50%. The left ventricle has mildly decreased function. The left ventricle demonstrates global hypokinesis. The left ventricular internal cavity size was moderately to severely dilated. There  is moderate concentric left ventricular hypertrophy. Left ventricular diastolic parameters are consistent with Grade II diastolic dysfunction (pseudonormalization).  2. Right ventricular systolic function is mildly reduced. The right ventricular size is moderately enlarged. Mildly increased right ventricular wall thickness.  3. Left atrial size was mildly dilated.  4. Right atrial size was mildly dilated.  5. Moderate pleural effusion in the left lateral region.  6. The mitral valve was not well visualized. Trivial mitral valve regurgitation.  7. The aortic valve is grossly normal. Aortic valve regurgitation is not visualized. Aortic valve sclerosis is present, with no evidence of aortic valve stenosis. FINDINGS  Left Ventricle: Left ventricular ejection fraction, by estimation, is 45 to 50%. The left ventricle has mildly decreased function. The left ventricle demonstrates global hypokinesis. The left ventricular  internal cavity size was moderately to severely dilated. There is moderate concentric left ventricular hypertrophy. Left ventricular diastolic parameters are consistent with Grade II diastolic dysfunction (pseudonormalization). Right Ventricle: The right ventricular size is moderately enlarged. Mildly increased right ventricular wall thickness. Right ventricular systolic function is mildly reduced. Left Atrium: Left atrial size was mildly dilated. Right Atrium: Right atrial size was mildly dilated. Pericardium: There is no evidence of pericardial effusion. Mitral Valve: The mitral valve was not well visualized. Trivial mitral valve regurgitation. Tricuspid Valve: The tricuspid valve is not well visualized. Tricuspid valve regurgitation  is mild. Aortic Valve: The aortic valve is grossly normal. Aortic valve regurgitation is not visualized. Aortic valve sclerosis is present, with no evidence of aortic valve stenosis. Aortic valve peak gradient measures 3.0 mmHg. Pulmonic Valve: The pulmonic valve was normal in structure. Pulmonic valve regurgitation is not visualized. Aorta: The aortic arch was not well visualized. IAS/Shunts: No atrial level shunt detected by color flow Doppler. Additional Comments: There is a moderate pleural effusion in the left lateral region.  LEFT VENTRICLE PLAX 2D LVIDd:         5.90 cm      Diastology LVIDs:         4.60 cm      LV e' medial:    8.49 cm/s LV PW:         1.50 cm      LV E/e' medial:  13.5 LV IVS:        1.60 cm      LV e' lateral:   8.49 cm/s LVOT diam:     2.30 cm      LV E/e' lateral: 13.5 LV SV:         43 LV SV Index:   17 LVOT Area:     4.15 cm  LV Volumes (MOD) LV vol d, MOD A4C: 254.0 ml LV vol s, MOD A4C: 175.0 ml LV SV MOD A4C:     254.0 ml LEFT ATRIUM           Index LA diam:      4.20 cm 1.65 cm/m LA Vol (A4C): 43.0 ml 16.85 ml/m  AORTIC VALVE                 PULMONIC VALVE AV Area (Vmax): 2.99 cm     PV Vmax:       0.67 m/s AV Vmax:        86.60 cm/s   PV Peak  grad:  1.8 mmHg AV Peak Grad:   3.0 mmHg LVOT Vmax:      62.30 cm/s LVOT Vmean:     41.800 cm/s LVOT VTI:       0.103 m  AORTA Ao Root diam: 3.70 cm Ao Asc diam:  2.90 cm MITRAL VALVE MV Area (PHT): 6.90 cm     SHUNTS MV Decel Time: 110 msec     Systemic VTI:  0.10 m MV E velocity: 115.00 cm/s  Systemic Diam: 2.30 cm MV A velocity: 82.30 cm/s MV E/A ratio:  1.40 Yolonda Kida MD Electronically signed by Yolonda Kida MD Signature Date/Time: 04/12/2022/4:14:41 PM    Final    US Abdomen Complete  Result Date: 04/12/2022 CLINICAL DATA:  Cirrhosis with ascites. EXAM: ABDOMEN ULTRASOUND COMPLETE COMPARISON:  Right upper quadrant ultrasound on 04/08/2022 FINDINGS: Gallbladder: Surgically absent. Common bile duct: Diameter: 7 mm, within normal limits post cholecystectomy. Liver: Suboptimal visualization due to patient habitus and limited acoustic windows. No focal liver lesion identified. Within normal limits in parenchymal echogenicity. Portal vein is patent on color Doppler imaging with normal direction of blood flow towards the liver. IVC: No abnormality visualized. Pancreas: Not visualized due to patient habitus and bowel gas. Spleen: Could not be visualized due to bandages from left chest tube. Right Kidney: Length: 11.9 cm. Echogenicity within normal limits. No mass or hydronephrosis visualized. Left Kidney: Length: 11.8 cm. Echogenicity within normal limits. No mass or hydronephrosis visualized. Abdominal aorta: Not visualized due to overlying bowel gas. Other findings: No evidence of ascites. IMPRESSION: Technically limited exam  due to patient habitus and bandages from chest tube. Prior cholecystectomy.  No evidence of biliary ductal dilatation. No definite hepatic abnormality identified.  No evidence of ascites. Electronically Signed   By: Marlaine Hind M.D.   On: 04/12/2022 12:44   DG Chest Port 1 View  Result Date: 04/12/2022 CLINICAL DATA:  Chest tube in place EXAM: PORTABLE CHEST 1 VIEW  COMPARISON:  Chest x-ray April 12, 2022 FINDINGS: A left chest tube is in stable position. Persistent effusion with underlying opacity, similar to mildly worsened in the interval. No pneumothorax. No other changes. IMPRESSION: A left chest tube remains in place. Persistent effusion with underlying opacity, similar to mildly worsened in the interval. No pneumothorax. Electronically Signed   By: Dorise Bullion III M.D.   On: 04/12/2022 08:19   DG Chest Portable 1 View  Result Date: 04/12/2022 CLINICAL DATA:  Post chest tube placement EXAM: PORTABLE CHEST 1 VIEW COMPARISON:  Radiograph and CT 04/11/2022 FINDINGS: Decreased now small left pleural effusion after left basilar chest tube placement. Decreased left mid and lower lung atelectasis/consolidation. The right lung is clear. No pneumothorax. Stable cardiomediastinal silhouette. Aortic atherosclerotic calcification. IMPRESSION: Decreased now small left pleural effusion after left basilar chest tube placement. Electronically Signed   By: Placido Sou M.D.   On: 04/12/2022 00:31   CT Angio Chest PE W and/or Wo Contrast  Result Date: 04/11/2022 CLINICAL DATA:  Confusion and coughing up blood EXAM: CT ANGIOGRAPHY CHEST WITH CONTRAST TECHNIQUE: Multidetector CT imaging of the chest was performed using the standard protocol during bolus administration of intravenous contrast. Multiplanar CT image reconstructions and MIPs were obtained to evaluate the vascular anatomy. RADIATION DOSE REDUCTION: This exam was performed according to the departmental dose-optimization program which includes automated exposure control, adjustment of the mA and/or kV according to patient size and/or use of iterative reconstruction technique. CONTRAST:  45m OMNIPAQUE IOHEXOL 350 MG/ML SOLN COMPARISON:  CTA chest 10/15/2019 and radiographs earlier today FINDINGS: Cardiovascular: Satisfactory opacification of the pulmonary arteries to the segmental level. No evidence of pulmonary  embolism. Hazy hyperdensity layering along the periphery of the left lower lobe. This is indeterminate on single phase exam and could be due to heterogenous perfusion in the setting of pneumonia however is suspicious for active contrast extravasation possibly related to recent thoracentesis and/or AVM as this appears rounded peripherally (circa series 6/image 255-268). No definite feeding vessel is seen. Cardiomegaly. No pericardial effusion. Coronary artery and aortic atherosclerotic calcification. Mediastinum/Nodes: Shotty mediastinal and hilar lymph nodes have increased since 10/15/2019. The largest is a precarinal node measuring 1.3 cm (4/47). These are favored reactive. Unremarkable esophagus. Lungs/Pleura: Large left pleural effusion some of which is hyperdense posteriorly suspicious for hemothorax. Hazy ground-glass opacities in the superior segment left lower lobe and low-attenuation consolidation in the inferior left lower lobe. These findings may be due to hemorrhage and/or pneumonia. Secretions/fluid within the left mainstem bronchus. The left lower lobe bronchi are not visualized. Secretions/fluid within the lingular bronchi. Atelectasis/consolidation in the lingula. Mild bronchial wall thickening and clustered centrilobular micro nodularity in the right lung. Part solid and subsolid nodule in the right upper lobe measuring 1.0 cm with the solid component measuring 3 mm (6/66). Upper Abdomen: Cholecystectomy.  No acute abnormality. Musculoskeletal: No chest wall abnormality. No acute osseous findings. Review of the MIP images confirms the above findings. IMPRESSION: 1. Findings concerning for active hemorrhage in the left lower lobe possibly related to recent thoracentesis and/or AVM versus heterogenous perfusion in the setting of  pneumonia. 2. Large left hemothorax. 3. Ground-glass and low-attenuation consolidation in the left lower lobe suspicious for pneumonia and/or alveolar hemorrhage. 4.  Nonspecific secretions or blood within the left mainstem bronchus left lower lobe bronchi and lingula. 5. Part solid and subsolid nodule in the right upper lobe measuring 1.0 cm. Follow-up non-contrast CT recommended at 3-6 months to confirm persistence. If unchanged, and solid component remains <6 mm, annual CT is recommended until 5 years of stability has been established. If persistent these nodules should be considered highly suspicious if the solid component of the nodule is 6 mm or greater in size and enlarging. This recommendation follows the consensus statement: Guidelines for Management of Incidental Pulmonary Nodules Detected on CT Images: From the Fleischner Society 2017; Radiology 2017; 284:228-243. Critical Value/emergent results were called by telephone at the time of interpretation on 04/11/2022 at 10:34 pm to provider PHILLIP STAFFORD , who verbally acknowledged these results. Electronically Signed   By: Placido Sou M.D.   On: 04/11/2022 22:50   DG Chest Portable 1 View  Result Date: 04/11/2022 CLINICAL DATA:  Shortness of breath and decreased left breath sounds EXAM: PORTABLE CHEST 1 VIEW COMPARISON:  Radiograph 04/02/2022 FINDINGS: Similar to increased large left pleural effusion and associated atelectasis/consolidation. The right lung is clear. No pneumothorax. Stable partially obscured cardiomediastinal silhouette. Aortic atherosclerotic calcification. IMPRESSION: Slightly increased large left pleural effusion and associated atelectasis/consolidation. Electronically Signed   By: Placido Sou M.D.   On: 04/11/2022 20:51     Medications:    albumin human Stopped (04/13/22 1601)   furosemide (LASIX) 200 mg in dextrose 5 % 100 mL (2 mg/mL) infusion 4 mg/hr (04/13/22 0510)   methocarbamol (ROBAXIN) IV      atorvastatin  80 mg Oral Daily   enoxaparin (LOVENOX) injection  0.5 mg/kg Subcutaneous Q24H    HYDROmorphone (DILAUDID) injection  0.5 mg Intravenous Once   insulin aspart   0-15 Units Subcutaneous TID WC   insulin aspart  0-5 Units Subcutaneous QHS   insulin glargine-yfgn  25 Units Subcutaneous QHS   oseltamivir  30 mg Oral Daily   sodium chloride flush  3 mL Intravenous Q12H   acetaminophen **OR** acetaminophen, HYDROmorphone (DILAUDID) injection, methocarbamol (ROBAXIN) IV, ondansetron **OR** ondansetron (ZOFRAN) IV, oxyCODONE  Assessment/ Plan:  72 y.o. male with past medical history of diabetes mellitus type 2, morbid obesity, hypertension, coronary artery disease status post stent placement, cardiomyopathy, low testosterone, hyperlipidemia, history of first left toe osteomyelitis with amputation, hydronephrosis, osteoarthritis of the right hip, history of idiopathic membranous glomerulonephritis who presents with increasing shortness of breath.  1.  Idiopathic membranous glomerulonephritis/chronic kidney disease stage II/proteinuria most recent urine protein to creatinine ratio 4.9.  Patient was in the process of being evaluated for rituximab treatment of his idiopathic membranous pyelonephritis.  Now has ongoing shortness of breath.    2. Lower extremity edema. Currently well to IV Lasix drip at 4 mg/h.  Monitor volume status closely as well as renal function.  3.  Anemia of chronic kidney disease.  Hemoglobin currently 7.3.  He will likely need to be evaluated for Retacrit as an outpatient.  Consider blood transfusion for hemoglobin of 7 or less.  4.  Acute on chronic combined systolic and diastolic CHF, acute hypoxemic respiratory failure, volume overload, coronary artery disease. Volume management with Lasix drip as above. Recurrent left-sided pleural effusion secondary to nephrotic syndrome, third spacing of fluid.  Currently managed with left chest tube.  5.  Influenza A On Tamiflu.  LOS: 1 Jevaughn Degollado 1/29/20244:13 PM

## 2022-04-13 NOTE — ED Notes (Signed)
Patient Chest Tube container changed by this nurse. Patient had 1527m if blood tinged output in container.

## 2022-04-13 NOTE — Progress Notes (Signed)
[facility]    SUBJECTIVE: Patient states he feels better than yesterday he started getting sick relatively quickly over the last 48 hours presented with severe respiratory distress weakness fatigue low-grade temp found to have a large pleural effusion shortness of breath dyspnea now improved after chest tube and therapy still has left-sided soreness in his chest from the chest tube   Vitals:   04/13/22 0100 04/13/22 0200 04/13/22 0218 04/13/22 0515  BP: (!) 114/59 137/65  132/62  Pulse: 86 92  85  Resp: '13 18  15  '$ Temp:   97.9 F (36.6 C) 97.9 F (36.6 C)  TempSrc:   Oral Oral  SpO2: 100% 100%  100%  Weight:      Height:         Intake/Output Summary (Last 24 hours) at 04/13/2022 0754 Last data filed at 04/12/2022 0949 Gross per 24 hour  Intake 3 ml  Output --  Net 3 ml      PHYSICAL EXAM  General: Well developed, well nourished, in no acute distress HEENT:  Normocephalic and atramatic Neck:  No JVD.  Lungs: Clear bilaterally to auscultation and percussion.  Chest tube left chest diminished breath sounds Heart: HRRR . Normal S1 and S2 without gallops or murmurs.  Abdomen: Bowel sounds are positive, abdomen soft and non-tender  Msk:  Back normal, normal gait. Normal strength and tone for age. Extremities: No clubbing, cyanosis or 3+edema.   Neuro: Alert and oriented X 3. Psych:  Good affect, responds appropriately   LABS: Basic Metabolic Panel: Recent Labs    04/12/22 0500 04/13/22 0520  NA 133* 132*  K 4.1 4.0  CL 98 97*  CO2 24 24  GLUCOSE 224* 253*  BUN 69* 76*  CREATININE 1.92* 2.03*  CALCIUM 7.9* 7.6*  MG  --  2.2   Liver Function Tests: Recent Labs    04/11/22 2033 04/12/22 0500  AST 23 24  ALT 15 14  ALKPHOS 72 60  BILITOT 1.4* 1.0  PROT 5.9* 5.8*  ALBUMIN 2.5* 2.4*   No results for input(s): "LIPASE", "AMYLASE" in the last 72 hours. CBC: Recent Labs    04/11/22 2033 04/12/22 0500 04/13/22 0520  WBC 5.3 5.5 4.0  NEUTROABS 4.5   --   --   HGB 8.1* 8.0* 7.3*  HCT 25.8* 26.0* 23.5*  MCV 89.9 92.5 90.7  PLT 193 189 195   Cardiac Enzymes: No results for input(s): "CKTOTAL", "CKMB", "CKMBINDEX", "TROPONINI" in the last 72 hours. BNP: Invalid input(s): "POCBNP" D-Dimer: No results for input(s): "DDIMER" in the last 72 hours. Hemoglobin A1C: Recent Labs    04/12/22 0500  HGBA1C 5.9*   Fasting Lipid Panel: No results for input(s): "CHOL", "HDL", "LDLCALC", "TRIG", "CHOLHDL", "LDLDIRECT" in the last 72 hours. Thyroid Function Tests: No results for input(s): "TSH", "T4TOTAL", "T3FREE", "THYROIDAB" in the last 72 hours.  Invalid input(s): "FREET3" Anemia Panel: Recent Labs    04/12/22 0500  VITAMINB12 846    ECHOCARDIOGRAM COMPLETE  Result Date: 04/12/2022    ECHOCARDIOGRAM REPORT   Patient Name:   [name] Date of Exam: 04/12/2022 Medical Rec #:  [id number]      Height:       72.0 in Accession #:    [id number]     Weight:       305.8 lb Date of Birth:  02/21/1951      BSA:          2.552 m Patient Age:    72 years  BP:           136/99 mmHg Patient Gender: M              HR:           101 bpm. Exam Location:  [facility] Procedure: 2D Echo Indications:     CHF I50.21  History:         Patient has no prior history of Echocardiogram examinations.  Sonographer:     Kathlen Brunswick RDCS Referring Phys:  [id number] Sharen Hones Diagnosing Phys: Yolonda Kida MD  Sonographer Comments: Technically challenging study due to limited acoustic windows, patient is obese and suboptimal apical window. Image acquisition challenging due to patient body habitus. Difficult and challenging study due to left side chest tube and  drain at the time of this study. IMPRESSIONS  1. Left ventricular ejection fraction, by estimation, is 45 to 50%. The left ventricle has mildly decreased function. The left ventricle demonstrates global hypokinesis. The left ventricular internal cavity size was moderately to severely dilated. There  is moderate  concentric left ventricular hypertrophy. Left ventricular diastolic parameters are consistent with Grade II diastolic dysfunction (pseudonormalization).  2. Right ventricular systolic function is mildly reduced. The right ventricular size is moderately enlarged. Mildly increased right ventricular wall thickness.  3. Left atrial size was mildly dilated.  4. Right atrial size was mildly dilated.  5. Moderate pleural effusion in the left lateral region.  6. The mitral valve was not well visualized. Trivial mitral valve regurgitation.  7. The aortic valve is grossly normal. Aortic valve regurgitation is not visualized. Aortic valve sclerosis is present, with no evidence of aortic valve stenosis. FINDINGS  Left Ventricle: Left ventricular ejection fraction, by estimation, is 45 to 50%. The left ventricle has mildly decreased function. The left ventricle demonstrates global hypokinesis. The left ventricular internal cavity size was moderately to severely dilated. There is moderate concentric left ventricular hypertrophy. Left ventricular diastolic parameters are consistent with Grade II diastolic dysfunction (pseudonormalization). Right Ventricle: The right ventricular size is moderately enlarged. Mildly increased right ventricular wall thickness. Right ventricular systolic function is mildly reduced. Left Atrium: Left atrial size was mildly dilated. Right Atrium: Right atrial size was mildly dilated. Pericardium: There is no evidence of pericardial effusion. Mitral Valve: The mitral valve was not well visualized. Trivial mitral valve regurgitation. Tricuspid Valve: The tricuspid valve is not well visualized. Tricuspid valve regurgitation is mild. Aortic Valve: The aortic valve is grossly normal. Aortic valve regurgitation is not visualized. Aortic valve sclerosis is present, with no evidence of aortic valve stenosis. Aortic valve peak gradient measures 3.0 mmHg. Pulmonic Valve: The pulmonic valve was normal in structure.  Pulmonic valve regurgitation is not visualized. Aorta: The aortic arch was not well visualized. IAS/Shunts: No atrial level shunt detected by color flow Doppler. Additional Comments: There is a moderate pleural effusion in the left lateral region.  LEFT VENTRICLE PLAX 2D LVIDd:         5.90 cm      Diastology LVIDs:         4.60 cm      LV e' medial:    8.49 cm/s LV PW:         1.50 cm      LV E/e' medial:  13.5 LV IVS:        1.60 cm      LV e' lateral:   8.49 cm/s LVOT diam:     2.30 cm      LV  E/e' lateral: 13.5 LV SV:         43 LV SV Index:   17 LVOT Area:     4.15 cm  LV Volumes (MOD) LV vol d, MOD A4C: 254.0 ml LV vol s, MOD A4C: 175.0 ml LV SV MOD A4C:     254.0 ml LEFT ATRIUM           Index LA diam:      4.20 cm 1.65 cm/m LA Vol (A4C): 43.0 ml 16.85 ml/m  AORTIC VALVE                 PULMONIC VALVE AV Area (Vmax): 2.99 cm     PV Vmax:       0.67 m/s AV Vmax:        86.60 cm/s   PV Peak grad:  1.8 mmHg AV Peak Grad:   3.0 mmHg LVOT Vmax:      62.30 cm/s LVOT Vmean:     41.800 cm/s LVOT VTI:       0.103 m  AORTA Ao Root diam: 3.70 cm Ao Asc diam:  2.90 cm MITRAL VALVE MV Area (PHT): 6.90 cm     SHUNTS MV Decel Time: 110 msec     Systemic VTI:  0.10 m MV E velocity: 115.00 cm/s  Systemic Diam: 2.30 cm MV A velocity: 82.30 cm/s MV E/A ratio:  1.40 Yolonda Kida MD Electronically signed by Yolonda Kida MD Signature Date/Time: 04/12/2022/4:14:41 PM    Final    US Abdomen Complete  Result Date: 04/12/2022 CLINICAL DATA:  Cirrhosis with ascites. EXAM: ABDOMEN ULTRASOUND COMPLETE COMPARISON:  Right upper quadrant ultrasound on 04/08/2022 FINDINGS: Gallbladder: Surgically absent. Common bile duct: Diameter: 7 mm, within normal limits post cholecystectomy. Liver: Suboptimal visualization due to patient habitus and limited acoustic windows. No focal liver lesion identified. Within normal limits in parenchymal echogenicity. Portal vein is patent on color Doppler imaging with normal direction of blood  flow towards the liver. IVC: No abnormality visualized. Pancreas: Not visualized due to patient habitus and bowel gas. Spleen: Could not be visualized due to bandages from left chest tube. Right Kidney: Length: 11.9 cm. Echogenicity within normal limits. No mass or hydronephrosis visualized. Left Kidney: Length: 11.8 cm. Echogenicity within normal limits. No mass or hydronephrosis visualized. Abdominal aorta: Not visualized due to overlying bowel gas. Other findings: No evidence of ascites. IMPRESSION: Technically limited exam due to patient habitus and bandages from chest tube. Prior cholecystectomy.  No evidence of biliary ductal dilatation. No definite hepatic abnormality identified.  No evidence of ascites. Electronically Signed   By: Marlaine Hind M.D.   On: 04/12/2022 12:44   DG Chest Port 1 View  Result Date: 04/12/2022 CLINICAL DATA:  Chest tube in place EXAM: PORTABLE CHEST 1 VIEW COMPARISON:  Chest x-ray April 12, 2022 FINDINGS: A left chest tube is in stable position. Persistent effusion with underlying opacity, similar to mildly worsened in the interval. No pneumothorax. No other changes. IMPRESSION: A left chest tube remains in place. Persistent effusion with underlying opacity, similar to mildly worsened in the interval. No pneumothorax. Electronically Signed   By: Dorise Bullion III M.D.   On: 04/12/2022 08:19   DG Chest Portable 1 View  Result Date: 04/12/2022 CLINICAL DATA:  Post chest tube placement EXAM: PORTABLE CHEST 1 VIEW COMPARISON:  Radiograph and CT 04/11/2022 FINDINGS: Decreased now small left pleural effusion after left basilar chest tube placement. Decreased left mid and lower lung atelectasis/consolidation. The right  lung is clear. No pneumothorax. Stable cardiomediastinal silhouette. Aortic atherosclerotic calcification. IMPRESSION: Decreased now small left pleural effusion after left basilar chest tube placement. Electronically Signed   By: Placido Sou M.D.   On:  04/12/2022 00:31   CT Angio Chest PE W and/or Wo Contrast  Result Date: 04/11/2022 CLINICAL DATA:  Confusion and coughing up blood EXAM: CT ANGIOGRAPHY CHEST WITH CONTRAST TECHNIQUE: Multidetector CT imaging of the chest was performed using the standard protocol during bolus administration of intravenous contrast. Multiplanar CT image reconstructions and MIPs were obtained to evaluate the vascular anatomy. RADIATION DOSE REDUCTION: This exam was performed according to the departmental dose-optimization program which includes automated exposure control, adjustment of the mA and/or kV according to patient size and/or use of iterative reconstruction technique. CONTRAST:  35m OMNIPAQUE IOHEXOL 350 MG/ML SOLN COMPARISON:  CTA chest 10/15/2019 and radiographs earlier today FINDINGS: Cardiovascular: Satisfactory opacification of the pulmonary arteries to the segmental level. No evidence of pulmonary embolism. Hazy hyperdensity layering along the periphery of the left lower lobe. This is indeterminate on single phase exam and could be due to heterogenous perfusion in the setting of pneumonia however is suspicious for active contrast extravasation possibly related to recent thoracentesis and/or AVM as this appears rounded peripherally (circa series 6/image 255-268). No definite feeding vessel is seen. Cardiomegaly. No pericardial effusion. Coronary artery and aortic atherosclerotic calcification. Mediastinum/Nodes: Shotty mediastinal and hilar lymph nodes have increased since 10/15/2019. The largest is a precarinal node measuring 1.3 cm (4/47). These are favored reactive. Unremarkable esophagus. Lungs/Pleura: Large left pleural effusion some of which is hyperdense posteriorly suspicious for hemothorax. Hazy ground-glass opacities in the superior segment left lower lobe and low-attenuation consolidation in the inferior left lower lobe. These findings may be due to hemorrhage and/or pneumonia. Secretions/fluid within the  left mainstem bronchus. The left lower lobe bronchi are not visualized. Secretions/fluid within the lingular bronchi. Atelectasis/consolidation in the lingula. Mild bronchial wall thickening and clustered centrilobular micro nodularity in the right lung. Part solid and subsolid nodule in the right upper lobe measuring 1.0 cm with the solid component measuring 3 mm (6/66). Upper Abdomen: Cholecystectomy.  No acute abnormality. Musculoskeletal: No chest wall abnormality. No acute osseous findings. Review of the MIP images confirms the above findings. IMPRESSION: 1. Findings concerning for active hemorrhage in the left lower lobe possibly related to recent thoracentesis and/or AVM versus heterogenous perfusion in the setting of pneumonia. 2. Large left hemothorax. 3. Ground-glass and low-attenuation consolidation in the left lower lobe suspicious for pneumonia and/or alveolar hemorrhage. 4. Nonspecific secretions or blood within the left mainstem bronchus left lower lobe bronchi and lingula. 5. Part solid and subsolid nodule in the right upper lobe measuring 1.0 cm. Follow-up non-contrast CT recommended at 3-6 months to confirm persistence. If unchanged, and solid component remains <6 mm, annual CT is recommended until 5 years of stability has been established. If persistent these nodules should be considered highly suspicious if the solid component of the nodule is 6 mm or greater in size and enlarging. This recommendation follows the consensus statement: Guidelines for Management of Incidental Pulmonary Nodules Detected on CT Images: From the Fleischner Society 2017; Radiology 2017; 284:228-243. Critical Value/emergent results were called by telephone at the time of interpretation on 04/11/2022 at 10:34 pm to provider PHILLIP STAFFORD , who verbally acknowledged these results. Electronically Signed   By: TPlacido SouM.D.   On: 04/11/2022 22:50   DG Chest Portable 1 View  Result Date: 04/11/2022 CLINICAL DATA:  Shortness of breath and decreased left breath sounds EXAM: PORTABLE CHEST 1 VIEW COMPARISON:  Radiograph 04/02/2022 FINDINGS: Similar to increased large left pleural effusion and associated atelectasis/consolidation. The right lung is clear. No pneumothorax. Stable partially obscured cardiomediastinal silhouette. Aortic atherosclerotic calcification. IMPRESSION: Slightly increased large left pleural effusion and associated atelectasis/consolidation. Electronically Signed   By: Placido Sou M.D.   On: 04/11/2022 20:51     Echo with depressed left ventricular function EF around 45 to 50%  TELEMETRY: Sinus rhythm rate of 90 nonspecific ST-T wave changes:  ASSESSMENT AND PLAN:  Principal Problem:   Influenza A Active Problems:   Iron deficiency anemia   Diabetes mellitus type 2 in obese (HCC)   CAD (coronary artery disease)   Essential (primary) hypertension   Hyperlipidemia, mixed   Obesity, Class III, BMI 40-49.9 (morbid obesity) ([place])   Chronic kidney disease due to type 2 diabetes mellitus (HCC)   Edema of lower extremity   Acute on chronic systolic CHF (congestive heart failure) (HCC)   Acute renal failure superimposed on stage 2 chronic kidney disease (HCC)   Recurrent left pleural effusion   Acute hypoxemic respiratory failure (HCC)    Plan Acute on chronic combined systolic diastolic dysfunction continue supplemental oxygen continue diuretic therapy Pleural effusion left side status postthoracentesis with chest tube possibly related to Probable obstructive sleep apnea recommend sleep study CPAP if indicated weight loss Pneumonic pleural effusion versus heart failure or combination continue chest tube for now Hypoxemia related multifactorial pneumonia influenza A congestive heart failure probable obstructive sleep apnea continue supplemental oxygen as necessary Cardiomyopathy mild ejection fraction between 45 to 50% with significant diastolic dysfunction continue current  management Influenza A infection with respiratory component continue supplemental oxygen and inhalers as necessary Tamiflu Elevated troponin probably consistent with demand ischemia doubt non-STEMI continue current management Acute on chronic renal insufficiency appreciate nephrology input maintain adequate renal perfusion no indication for dialysis at this stage Morbid obesity recommend weight loss exercise portion control Lower extremity edema support stockings elevation pneumatic compression devices weight loss Diabetes type 2 poorly controlled continue insulin therapy Hypertension reasonably managed currently continue current management   Yolonda Kida, MD, 04/13/2022 7:54 AM

## 2022-04-13 NOTE — ED Notes (Signed)
Informed RN bed assigned 

## 2022-04-13 NOTE — Consult Note (Signed)
PULMONOLOGY         Date: 04/13/2022,   MRN# [id number] [name] 1951-01-30     AdmissionWeight: (!) 138.7 kg                 CurrentWeight: (!) 138.7 kg  Referring provider: Dr Roosevelt Locks   CHIEF COMPLAINT:   Parapneumonic effusion s/p chest tube placement    HISTORY OF PRESENT ILLNESS   This is a pleasant 72 year old male with severe morbid obesity chronic 3+ weeping lower extremity edema due to CKD with minimal-change disease and recent evaluation with nephrology for rituximab infusion, history of CHF with reduced EF at 40%, osteoarthritis, diabetes, history of melanoma, neuropathy, spinal stenosis with chief complaint of shortness of breath and chest discomfort.  Patient is well-known to our pulmonology service with [facility] clinic we recently saw him for acute on chronic hypoxemic respiratory failure noted to have left-sided large effusion and right-sided mild to moderate effusion.  Status postthoracentesis x 2 with over 1 L drained.  This is a third time that he is shown to have a large pleural effusion in stent 3 months.  He did receive pleural drain upon arrival to the ER with 1500 cc serosanguineous return overnight and active drainage this a.m. and up to 150 cc before lunchtime.  On interview and physical exam patient reports slight improvement mild chest discomfort at pigtail insertion site.  Chest tube management displays no airleak upon forced expiratory maneuver.   PAST MEDICAL HISTORY   Past Medical History:  Diagnosis Date   Arthritis    CHF (congestive heart failure), NYHA class II, acute on chronic, systolic (HCC)    EF 24% 07/8097   Coronary artery disease    [facility]   Diabetes mellitus without complication ([place])    Dysplastic nevus 08/26/2020   L flank, moderate atypia   Hyperlipidemia    Hypertension    Melanoma ([place] 08/20/2015   Right neck. Superficial spreading, arising in nevus. Tumor thickness 1.77m, Anatomic level  IV   [place]   R lateral neck ant to scar, exc 05/22/20   Numbness in right leg    due to back   Skin cancer    removed l arm   Spinal stenosis    Stented coronary artery 2000   X 1 STENT   Swelling of both lower extremities      SURGICAL HISTORY   Past Surgical History:  Procedure Laterality Date   CATARACT EXTRACTION W/PHACO Right 05/03/2019   Procedure: CATARACT EXTRACTION PHACO AND INTRAOCULAR LENS PLACEMENT (IOC) RIGHT DIABETIC 6.44 00:58.4 11.0%;  Surgeon: BLeandrew Koyanagi MD;  [facility] [facility]  Service: Ophthalmology;  Laterality: Right;  DIABETIC   CATARACT EXTRACTION W/PHACO Left 06/28/2019   Procedure: CATARACT EXTRACTION PHACO AND INTRAOCULAR LENS PLACEMENT (IOC) LEFT DIABETIC 7.99  01:13.4  10.9% ;  Surgeon: BLeandrew Koyanagi MD;  [place] [facility]  Service: Ophthalmology;  Laterality: Left;  Diabetic - insulin and oral meds   CHOLECYSTECTOMY     JOINT REPLACEMENT     RT KNEE   LUMBAR LAMINECTOMY/DECOMPRESSION MICRODISCECTOMY  04/13/2012   Procedure: LUMBAR LAMINECTOMY/DECOMPRESSION MICRODISCECTOMY 1 LEVEL;  Surgeon: JJohnn Hai MD;  Location: [facility];  Service: Orthopedics;  Laterality: Bilateral;  L4-L5   UVULECTOMY  1990's     FAMILY HISTORY   History reviewed. No pertinent family history.   SOCIAL HISTORY   Social History   Tobacco Use   Smoking status:  Never   Smokeless tobacco: Never  Vaping Use   Vaping Use: Never used  Substance Use Topics   Alcohol use: Yes    Comment: rare   Drug use: No     MEDICATIONS    Home Medication:  Current Outpatient Rx   Order #: [id number] Class: Historical Med   Order #: [id number] Class: Historical Med   Order #: [id number] Class: Historical Med   Order #: [id number] Class: Historical Med   Order #: [id number] Class: Historical Med   Order #: [id number] Class: Historical Med   Order #: [id number] Class: Historical Med   Order #: [id number] Class:  Historical Med   Order #: [id number] Class: Historical Med   Order #: [id number] Class: Normal   Order #: [id number] Class: Historical Med   Order #: [id number] Class: Historical Med   Order #: [id number] Class: Historical Med   Order #: [id number] Class: Historical Med   Order #: [id number] Class: Historical Med   Order #: [id number] Class: Historical Med   Order #: [id number] Class: Historical Med   Order #: [id number] Class: Historical Med   Order #: [id number] Class: Historical Med   Order #: [id number] Class: Historical Med   Order #: [id number] Class: Historical Med   Order #: [id number] Class: Historical Med   Order #: [id number] Class: Historical Med   Order #: [id number] Class: Historical Med    Current Medication:  Current Facility-Administered Medications:    acetaminophen (TYLENOL) tablet 650 mg, 650 mg, Oral, Q6H PRN **OR** acetaminophen (TYLENOL) suppository 650 mg, 650 mg, Rectal, Q6H PRN, Sid Falcon, MD   atorvastatin (LIPITOR) tablet 80 mg, 80 mg, Oral, Daily, Gilles Chiquito B, MD, 80 mg at 04/12/22 0945   enoxaparin (LOVENOX) injection 70 mg, 0.5 mg/kg, Subcutaneous, Q24H, Gilles Chiquito B, MD, 70 mg at 04/12/22 0748   furosemide (LASIX) 200 mg in dextrose 5 % 100 mL (2 mg/mL) infusion, 4 mg/hr, Intravenous, Continuous, Lateef, Munsoor, MD, Last Rate: 2 mL/hr at 04/13/22 0510, 4 mg/hr at 04/13/22 0510   HYDROmorphone (DILAUDID) injection 0.5 mg, 0.5 mg, Intravenous, Once, Gilles Chiquito B, MD   HYDROmorphone (DILAUDID) injection 0.5-1 mg, 0.5-1 mg, Intravenous, Q2H PRN, Gilles Chiquito B, MD, 1 mg at 04/12/22 0257   insulin aspart (novoLOG) injection 0-15 Units, 0-15 Units, Subcutaneous, TID WC, Gilles Chiquito B, MD, 3 Units at 04/12/22 1703   insulin aspart (novoLOG) injection 0-5 Units, 0-5 Units, Subcutaneous, QHS, Gilles Chiquito B, MD, 2 Units at 04/12/22 0445   insulin glargine-yfgn (SEMGLEE) injection 15 Units, 15 Units, Subcutaneous, QHS, Gilles Chiquito B, MD, 15 Units at 04/12/22 2109    methocarbamol (ROBAXIN) 500 mg in dextrose 5 % 50 mL IVPB, 500 mg, Intravenous, Q6H PRN, Sid Falcon, MD   ondansetron (ZOFRAN) tablet 4 mg, 4 mg, Oral, Q6H PRN **OR** ondansetron (ZOFRAN) injection 4 mg, 4 mg, Intravenous, Q6H PRN, Gilles Chiquito B, MD   oseltamivir (TAMIFLU) capsule 30 mg, 30 mg, Oral, Daily, Gilles Chiquito B, MD, 30 mg at 04/12/22 0945   oxyCODONE (Oxy IR/ROXICODONE) immediate release tablet 5 mg, 5 mg, Oral, Q4H PRN, Gilles Chiquito B, MD, 5 mg at 04/12/22 2300   sodium chloride flush (NS) 0.9 % injection 3 mL, 3 mL, Intravenous, Q12H, Gilles Chiquito B, MD, 3 mL at 04/12/22 0949  Current Outpatient Medications:    aspirin EC 81 MG tablet, Take 162 mg by mouth daily. Swallow whole., Disp: , Rfl:    atorvastatin (LIPITOR) 80 MG tablet, Take 80 mg by mouth daily., Disp: , Rfl:    Continuous Blood Gluc Receiver ([place]) DEVI, Use to monitor blood  sugar. [facility] [phone], Disp: , Rfl:    Continuous Blood Gluc Sensor (DEXCOM G6 SENSOR) MISC, Use to monitor blood sugar.  Replace every 10 days. [place] [id number]., Disp: , Rfl:    Continuous Blood Gluc Transmit (DEXCOM G6 TRANSMITTER) MISC, Use to monitor blood sugar.  Replace every 3 months. [place] [phone]., Disp: , Rfl:    insulin glargine (LANTUS) 100 UNIT/ML injection, Inject 34 Units into the skin daily., Disp: , Rfl:    metFORMIN (GLUCOPHAGE) 500 MG tablet, Take 1,000 mg by mouth daily with supper., Disp: , Rfl:    torsemide (DEMADEX) 100 MG tablet, Take 100 mg by mouth daily., Disp: , Rfl:    amLODipine (NORVASC) 5 MG tablet, Take 5 mg by mouth daily. (Patient not taking: Reported on 04/12/2022), Disp: , Rfl:    FEROSUL 325 (65 Fe) MG tablet, Take 1 tablet (325 mg total) by mouth 2 (two) times daily. (Patient not taking: Reported on 04/12/2022), Disp: 180 tablet, Rfl: 1   ferrous sulfate (FEROSUL) 325 (65 FE) MG tablet, Take by mouth., Disp: , Rfl:    furosemide (LASIX) 20 MG tablet, Take by mouth. (Patient not  taking: Reported on 04/12/2022), Disp: , Rfl:    furosemide (LASIX) 80 MG tablet, Take by mouth. (Patient not taking: Reported on 04/12/2022), Disp: , Rfl:    glipiZIDE (GLUCOTROL XL) 5 MG 24 hr tablet, , Disp: , Rfl:    insulin aspart (NOVOLOG) 100 UNIT/ML injection, Inject into the skin 3 (three) times daily before meals., Disp: , Rfl:    Insulin Pen Needle (COMFORT EZ PEN NEEDLES) 32G X 8 MM MISC, See admin instructions., Disp: , Rfl:    isosorbide mononitrate (IMDUR) 30 MG 24 hr tablet, Take by mouth., Disp: , Rfl:    JARDIANCE 10 MG TABS tablet, , Disp: , Rfl:    LANTUS SOLOSTAR 100 UNIT/ML Solostar Pen, SMARTSIG:44 Unit(s) SUB-Q Every Night, Disp: , Rfl:    losartan (COZAAR) 100 MG tablet, , Disp: , Rfl:    NOVOLOG FLEXPEN 100 UNIT/ML FlexPen, INJ UP TO 50 UNI D [place] IN MULTIPLE INJECTIONS B MEALS UTD, Disp: , Rfl:    spironolactone (ALDACTONE) 25 MG tablet, Take 50 mg by mouth daily., Disp: , Rfl:    urea (CARMOL) 10 % cream, Apply topically. (Patient not taking: Reported on 02/19/2022), Disp: , Rfl:    valsartan (DIOVAN) 320 MG tablet, Take 320 mg by mouth daily., Disp: , Rfl:     ALLERGIES   Carvedilol, Metoprolol, Toprol xl [metoprolol tartrate], Buprenorphine, Buprenorphine hcl, Morphine, Morphine and related, Triamcinolone, and Triamcinolone acetonide     REVIEW OF SYSTEMS    Review of Systems:  Gen:  Denies  fever, sweats, chills weigh loss  HEENT: Denies blurred vision, double vision, ear pain, eye pain, hearing loss, nose bleeds, sore throat Cardiac:  No dizziness, chest pain or heaviness, chest tightness,edema Resp:   reports dyspnea chronically  Gi: Denies swallowing difficulty, stomach pain, nausea or vomiting, diarrhea, constipation, bowel incontinence Gu:  Denies bladder incontinence, burning urine Ext:   Denies Joint pain, stiffness or swelling Skin: Denies  skin rash, easy bruising or bleeding or hives Endoc:  Denies polyuria, polydipsia , polyphagia or weight  change Psych:   Denies depression, insomnia or hallucinations   Other:  All other systems negative   VS: BP 132/62   Pulse 85   Temp 97.9 F (36.6 C) (Oral)   Resp 15   Ht 6' (1.829 m)   Wt (!) 138.7 kg  SpO2 100%   BMI 41.47 kg/m      PHYSICAL EXAM    GENERAL:NAD, no fevers, chills, no weakness no fatigue HEAD: Normocephalic, atraumatic.  EYES: Pupils equal, round, reactive to light. Extraocular muscles intact. No scleral icterus.  MOUTH: Moist mucosal membrane. Dentition intact. No abscess noted.  EAR, NOSE, THROAT: Clear without exudates. No external lesions.  NECK: Supple. No thyromegaly. No nodules. No JVD.  PULMONARY: decreased breath sounds with mild rhonchi worse at bases bilaterally.  CARDIOVASCULAR: S1 and S2. Regular rate and rhythm. No murmurs, rubs, or gallops. No edema. Pedal pulses 2+ bilaterally.  GASTROINTESTINAL: Soft, nontender, nondistended. No masses. Positive bowel sounds. No hepatosplenomegaly.  MUSCULOSKELETAL: No swelling, clubbing, or edema. Range of motion full in all extremities.  NEUROLOGIC: Cranial nerves II through XII are intact. No gross focal neurological deficits. Sensation intact. Reflexes intact.  SKIN: No ulceration, lesions, rashes, or cyanosis. Skin warm and dry. Turgor intact.  PSYCHIATRIC: Mood, affect within normal limits. The patient is awake, alert and oriented x 3. Insight, judgment intact.       IMAGING     ASSESSMENT/PLAN   Acute on chronic hypoxemic respiratory failure Present on admission due to influenza A infection with recurrent left pleural effusion and associated compressive atelectasis -Patient's evaluated by nephrology and is status post Lasix drip as well as pigtail chest tube on the left with 1500 cc serosanguineous fluid overnight and active drain during examination today -Is no air leak upon forced aspiratory maneuver, chest tube is not clogged and fluid is freely flowing. -Fluid was previously tested and  was noted to be lymphocyte predominant fluid and became exudative upon initiation of diuretics which is known to happen.  Does not appear to be empyema and I do not think he is a very good candidate for intrapleural thrombolytics.  Patient may be heading towards dialysis but if not he may also benefit from thoracic surgery evaluation and possible pleurodesis.  Discussed this care plan with son and patient at bedside as well as attending physician Dr. Roosevelt Locks    Thank you for allowing me to participate in the care of this patient.   Patient/Family are satisfied with care plan and all questions have been answered.    Provider disclosure: Patient with at least one acute or chronic illness or injury that poses a threat to life or bodily function and is being managed actively during this encounter.  All of the below services have been performed independently by signing provider:  review of prior documentation from internal and or external health records.  Review of previous and current lab results.  Interview and comprehensive assessment during patient visit today. Review of current and previous chest radiographs/CT scans. Discussion of management and test interpretation with health care team and patient/family.   This document was prepared using Dragon voice recognition software and may include unintentional dictation errors.     Ottie Glazier, M.D.  Division of Pulmonary & Critical Care Medicine

## 2022-04-13 NOTE — Progress Notes (Addendum)
Inpatient Diabetes Program Recommendations  AACE/ADA: New Consensus Statement on Inpatient Glycemic Control (2015)  Target Ranges:  Prepandial:   less than 140 mg/dL      Peak postprandial:   less than 180 mg/dL (1-2 hours)      Critically ill patients:  140 - 180 mg/dL   Lab Results  Component Value Date   GLUCAP 220 (H) 04/13/2022   HGBA1C 5.9 (H) 04/12/2022    Review of Glycemic Control  Latest Reference Range & Units 04/12/22 16:23 04/12/22 20:23 04/13/22 07:57 04/13/22 11:24  Glucose-Capillary 70 - 99 mg/dL 188 (H) 178 (H) 180 (H) 220 (H)   Diabetes history: DM 2 Outpatient Diabetes medications:  Dexcom G6 monitor Glucotrol XL 5 mg daily Novolog 50 units with Breakfast and 60 units with lunch Lantus 34 units daily Jardiance 10 mg daily Metformin 1000 mg bid  Current orders for Inpatient glycemic control:  Novolog 0-15 units tid with meals and HS Semglee 25 units q HS (increased today)  Inpatient Diabetes Program Recommendations:    Note that patient was on large doses of meal coverage insulin prior to admit.  A1C is 5.9%- Patient sees Dr. Gabriel Carina, endocrinology. Will follow.   Thanks,  Adah Perl, RN, BC-ADM Inpatient Diabetes Coordinator Pager [phone]  (8a-5p)

## 2022-04-13 NOTE — Progress Notes (Signed)
Progress Note   Patient: [name] NTI:[id number] DOB: Aug 14, 1950 DOA: 04/11/2022     1 DOS: the patient was seen and examined on 04/13/2022   Brief hospital course: [name] is a 72 y.o. male with medical history significant of arthritis, CHF (chronic), CAD, DM2, HLD, HTN, h/o treated melanoma, spinal stenosis who presents for worsening cough, fatigue and sob. Had low-grade fever, he was found to have positive for influenza A. He had recurrent left-sided pleural effusion, had a thoracentesis x 2 during the last months.  Upon arrival emergency room, chest x-ray showed large left-sided pleural effusion again, chest tube was placed by ED physician. Patient also developed significant hypoxemia with oxygen saturation as low as 76% on room air this morning.  Treated with oxygen. Patient has severe volume overload with evidence of bichamber heart failure.  Echocardiogram showed EF 45-50% with grade 2 diastolic dysfunction. Pulm valve not visualized.  Furosemide drip started after seen by nephrology.   1/29.  Patient had minimal urine after Lasix, but chest tube had drained additional 1500 mL in 24 hours.   Assessment and Plan: Acute on chronic combined systolic and diastolic congestive heart failure Acute hypoxemic respiratory failure secondary to volume overload. Elevated troponin with NSTEMI. Coronary artery disease. Patient has significant hypoxemia, oxygen dropped down to 76% when oxygen is off.  Patient has significant short of breath with minimal exertion. Patient also has significant volume overload.  He has evidence of bichamber heart failure.  He has severe bilateral leg edema, evidence of shifting dullness.  Also has significant elevation of BNP. Discussed with nephrology and cardiology,started lasix drip Abdominal ultrasound did not show any ascites. Patient had a minimal urine after Lasix drip, but had output 1500 mL from chest tube. Patient appears to be feeling better.  Given  significant protein loss from chest tube, I will give 50 g of albumin.    Influenza A. On Tamiflu.   Recurrent left side pleural effusion.  Hemothorax ruled out. Chest tube was placed in the emergency room, removed 2 L of serous fluids.  No evidence of bleeding.  Based on prior lab test, cytology was negative.  Prior test also does not indicate infection.  Reviewed chest x-ray post chest tube, no evidence of pneumonia. Procalcitonin level not significant elevated at 0.2 with chronic renal function.  No evidence of infection.  Patient is followed by pulmonology, patient appears to have rapid accumulation of pleural fluid, had a drain of 1500 mL overnight.  Discussion ongoing with pulmonology, CT surgery and nephrology.  Acute kidney injury on chronic kidney disease stage II. Hyponatremia. Discontinue IV fluids, patient grossly volume overloaded.  Patient be followed by nephrology. Patient appeared to have worsening renal function today limited urine output.  Will discuss with nephrology for the next step of the option.  Patient potentially may need hemodialysis.   Iron deficient anemia. Acute blood loss anemia ruled out. Will give IV iron.   Uncontrolled type 2 diabetes with hyperglycemia Increase the dose of long-acting insulin.   Essential hypertension Continue to monitor.   Morbid obesity BMI 41.47, Likely obstructive sleep apnea. Diet exercise. Will decide if patient need BiPAP before discharge.        Subjective:  Patient feels better today with shortness of breath.  But has significant output from chest tube.  Minimal urine output.  Physical Exam: Vitals:   04/13/22 0218 04/13/22 0515 04/13/22 0900 04/13/22 1254  BP:  132/62 112/62 (!) 112/55  Pulse:  85 89  87  Resp:  15 (!) 23 20  Temp: 97.9 F (36.6 C) 97.9 F (36.6 C) 97.7 F (36.5 C)   TempSrc: Oral Oral Oral   SpO2:  100% 100% 100%  Weight:      Height:       General exam: Appears calm and comfortable   Respiratory system: Clear to auscultation. Respiratory effort normal. Cardiovascular system: S1 & S2 heard, RRR. No JVD, murmurs, rubs, gallops or clicks.  Gastrointestinal system: Abdomen is nondistended, soft and nontender. No organomegaly or masses felt. Normal bowel sounds heard. Central nervous system: Alert and oriented. No focal neurological deficits. Extremities: 4+ leg edema, appear to be better Skin: No rashes, lesions or ulcers Psychiatry: Judgement and insight appear normal. Mood & affect appropriate.   Data Reviewed:  Lab results reviewed.  Family Communication: Son updated at bedside.  Disposition: Status is: Inpatient Remains inpatient appropriate because: Severity of disease, IV treatment  Planned Discharge Destination:  TBD    Time spent: 50 minutes  Author: Sharen Hones, MD 04/13/2022 1:25 PM  For on call review [email or web address].

## 2022-04-13 NOTE — Progress Notes (Signed)
Pt arrived to 2A via bed.  VSS, on 4L [place], purewick in place, chest tube draining from left side, dsg intact.  Flu A precautions in place.  Lasix gtt infusing. Per ED RN, only 110m of urine output since 0700.  MD at the bedside, aware of UO.  Pt also states no BM since PTA.  Skin assessment completed with second RN

## 2022-04-14 ENCOUNTER — Inpatient Hospital Stay: Payer: Medicare HMO

## 2022-04-14 DIAGNOSIS — N170 Acute kidney failure with tubular necrosis: Secondary | ICD-10-CM | POA: Diagnosis not present

## 2022-04-14 DIAGNOSIS — J9601 Acute respiratory failure with hypoxia: Secondary | ICD-10-CM | POA: Diagnosis not present

## 2022-04-14 DIAGNOSIS — J90 Pleural effusion, not elsewhere classified: Secondary | ICD-10-CM

## 2022-04-14 DIAGNOSIS — I5023 Acute on chronic systolic (congestive) heart failure: Secondary | ICD-10-CM | POA: Diagnosis not present

## 2022-04-14 DIAGNOSIS — J101 Influenza due to other identified influenza virus with other respiratory manifestations: Secondary | ICD-10-CM | POA: Diagnosis not present

## 2022-04-14 DIAGNOSIS — N032 Chronic nephritic syndrome with diffuse membranous glomerulonephritis: Secondary | ICD-10-CM | POA: Insufficient documentation

## 2022-04-14 LAB — CBC
HCT: 21.4 % — ABNORMAL LOW (ref 39.0–52.0)
Hemoglobin: 6.9 g/dL — ABNORMAL LOW (ref 13.0–17.0)
MCH: 28.5 pg (ref 26.0–34.0)
MCHC: 32.2 g/dL (ref 30.0–36.0)
MCV: 88.4 fL (ref 80.0–100.0)
Platelets: 180 10*3/uL (ref 150–400)
RBC: 2.42 MIL/uL — ABNORMAL LOW (ref 4.22–5.81)
RDW: 15.5 % (ref 11.5–15.5)
WBC: 3.9 10*3/uL — ABNORMAL LOW (ref 4.0–10.5)
nRBC: 0 % (ref 0.0–0.2)

## 2022-04-14 LAB — GLUCOSE, CAPILLARY
Glucose-Capillary: 148 mg/dL — ABNORMAL HIGH (ref 70–99)
Glucose-Capillary: 199 mg/dL — ABNORMAL HIGH (ref 70–99)
Glucose-Capillary: 161 mg/dL — ABNORMAL HIGH (ref 70–99)
Glucose-Capillary: 164 mg/dL — ABNORMAL HIGH (ref 70–99)
Glucose-Capillary: 168 mg/dL — ABNORMAL HIGH (ref 70–99)

## 2022-04-14 LAB — MAGNESIUM: Magnesium: 2.4 mg/dL (ref 1.7–2.4)

## 2022-04-14 LAB — BASIC METABOLIC PANEL
Anion gap: 6 (ref 5–15)
BUN: 82 mg/dL — ABNORMAL HIGH (ref 8–23)
CO2: 26 mmol/L (ref 22–32)
Calcium: 7.4 mg/dL — ABNORMAL LOW (ref 8.9–10.3)
Chloride: 100 mmol/L (ref 98–111)
Creatinine, Ser: 2.16 mg/dL — ABNORMAL HIGH (ref 0.61–1.24)
GFR, Estimated: 32 mL/min — ABNORMAL LOW (ref >60)
Glucose, Bld: 141 mg/dL — ABNORMAL HIGH (ref 70–99)
Potassium: 3.8 mmol/L (ref 3.5–5.1)
Sodium: 132 mmol/L — ABNORMAL LOW (ref 135–145)

## 2022-04-14 LAB — PREPARE RBC (CROSSMATCH)

## 2022-04-14 MED ORDER — ALBUMIN HUMAN 25 % IV SOLN
25.0000 g | Freq: Four times a day (QID) | INTRAVENOUS | Status: AC
  Administered 2022-04-14 (×2): 25 g via INTRAVENOUS
  Filled 2022-04-14 (×2): qty 100

## 2022-04-14 MED ORDER — MIDODRINE HCL 5 MG PO TABS
5.0000 mg | ORAL_TABLET | Freq: Three times a day (TID) | ORAL | Status: DC
  Administered 2022-04-14 – 2022-04-17 (×11): 5 mg via ORAL
  Filled 2022-04-14 (×11): qty 1

## 2022-04-14 MED ORDER — SODIUM CHLORIDE 0.9% IV SOLUTION: Freq: Once | INTRAVENOUS | Status: DC

## 2022-04-14 NOTE — TOC Progression Note (Signed)
Transition of Care [facility]) - Progression Note    Patient Details  Name: [name] MRN: [id number] Date of Birth: May 02, 1950  [facility] CM/SW Contact  Laurena Slimmer, RN Phone Number: 04/14/2022, 4:42 PM  Clinical Narrative:    Case reviewed for needs and disposition.        Expected Discharge Plan and Services                                               Social Determinants of Health (SDOH) Interventions SDOH Screenings   Food Insecurity: No Food Insecurity (04/13/2022)  Housing: Low Risk  (04/13/2022)  Transportation Needs: No Transportation Needs (04/13/2022)  Utilities: Not At Risk (04/13/2022)  Depression (PHQ2-9): Low Risk  (02/27/2022)  Recent Concern: Depression (PHQ2-9) - Medium Risk (02/02/2022)  Tobacco Use: Low Risk  (04/13/2022)    Readmission Risk Interventions     No data to display

## 2022-04-14 NOTE — Progress Notes (Signed)
Progress Note   Patient: [name] JOA:[id number] DOB: 1950-08-22 DOA: 04/11/2022     2 DOS: the patient was seen and examined on 04/14/2022   Brief hospital course: MATHIAS BOGACKI is a 72 y.o. male with medical history significant of arthritis, CHF (chronic), CAD, DM2, HLD, HTN, h/o treated melanoma, spinal stenosis who presents for worsening cough, fatigue and sob. Had low-grade fever, he was found to have positive for influenza A. He had recurrent left-sided pleural effusion, had a thoracentesis x 2 during the last months.  Upon arrival emergency room, chest x-ray showed large left-sided pleural effusion again, chest tube was placed by ED physician. Patient also developed significant hypoxemia with oxygen saturation as low as 76% on room air this morning.  Treated with oxygen. Patient has severe volume overload with evidence of bichamber heart failure.  Echocardiogram showed EF 45-50% with grade 2 diastolic dysfunction. Pulm valve not visualized.  Furosemide drip started after seen by nephrology.   1/29.  Patient had minimal urine after Lasix, but chest tube had drained additional 1500 mL in 24 hours.   Assessment and Plan: Acute on chronic combined systolic and diastolic congestive heart failure Acute hypoxemic respiratory failure secondary to volume overload. Elevated troponin with NSTEMI. Coronary artery disease. Patient has significant hypoxemia, oxygen dropped down to 76% when oxygen is off.  Patient has significant short of breath with minimal exertion. Patient also has significant volume overload.  He has evidence of bichamber heart failure.  He has severe bilateral leg edema.  Also has significant elevation of BNP. Discussed with nephrology and cardiology,started lasix drip Abdominal ultrasound did not show any ascites. Patient has been making urine, urine output was not accurate due to acute incontinent of urine.  Continue to follow with Lasix drip. Patient also has significant  drainage from the left-sided chest tube, which may have made the volume status better. Continue oxygen, oxygenation seems to be better.      Influenza A. CompleteTamiflu.   Recurrent left side pleural effusion.  Hemothorax ruled out. Chest tube was placed in the emergency room, removed 2 L of serous fluids.  No evidence of bleeding.  Based on prior lab test, cytology was negative.  Prior test also does not indicate infection.  Reviewed chest x-ray post chest tube, no evidence of pneumonia. Procalcitonin level not significant elevated at 0.2 with chronic renal function.  No evidence of infection.  Patient is followed by pulmonology, patient appears to have rapid accumulation of pleural fluid.  Had a discussion with CT surgery, pulmonology and nephrology.  Patient still has a large amount of the drain from the chest tube, patient cannot be discharged with chest tube in place.  The source of increased pleural effusion is due to heart failure, third spacing from renal disease.  Patient will be given albumin, diuretics.  If this does not improve, we may have to use Pleurx catheter.   Acute kidney injury on chronic kidney disease stage II. Idiopathic membranous glomerulonephritis. Hyponatremia. Patient has a worsening renal function on chronic renal disease.  He is grossly volume overloaded, he is started on Lasix drip.  Renal function is gradually getting worse.  There is a possibility that patient may need a dialysis. Nephrology is working with oncology to start rituximab.    Iron deficient anemia. Acute blood loss anemia ruled out. Patient received IV iron, hemoglobin dropped down to 6.9, chest tube fluids does not seem to be bloody.  Will give 1 unit PRBC.  No evidence  of GI bleed.   Uncontrolled type 2 diabetes with hyperglycemia Continue long-acting insulin, sliding scale insulin.   Essential hypertension Continue to monitor.   Morbid obesity BMI 41.47, Likely obstructive sleep  apnea. Diet exercise. Will decide if patient need BiPAP before discharge.           Subjective:  Patient feels better today, no shortness of breath.  Physical Exam: Vitals:   04/13/22 1941 04/13/22 2308 04/14/22 0309 04/14/22 0804  BP: 134/63 124/68 (!) 107/55 97/67  Pulse: 93 87 85 81  Resp: '18 16 18 20  '$ Temp: 98.5 F (36.9 C) 98.4 F (36.9 C) 98.2 F (36.8 C) 98.3 F (36.8 C)  TempSrc: Oral Oral Oral   SpO2: 100% 100% 100% 100%  Weight:      Height:       General exam: Appears calm and comfortable  Respiratory system: Clear to auscultation. Respiratory effort normal. Cardiovascular system: S1 & S2 heard, RRR. No JVD, murmurs, rubs, gallops or clicks.  Gastrointestinal system: Abdomen is nondistended, soft and nontender. No organomegaly or masses felt. Normal bowel sounds heard. Central nervous system: Alert and oriented. No focal neurological deficits. Extremities: 4+ leg edema improving. Skin: No rashes, lesions or ulcers Psychiatry: Judgement and insight appear normal. Mood & affect appropriate.   Data Reviewed:  Lab results reviewed.  Family Communication: None  Disposition: Status is: Inpatient Remains inpatient appropriate because: Severity of her disease, IV treatment, inpatient procedure.  Planned Discharge Destination:  TBD    Time spent: 50 minutes  Author: Sharen Hones, MD 04/14/2022 10:01 AM  For on call review [email or web address].

## 2022-04-14 NOTE — Progress Notes (Signed)
PT Cancellation Note  Patient Details Name: [name] MRN: [id number] DOB: 02-09-1951   Cancelled Treatment:    Reason Eval/Treat Not Completed: Medical issues which prohibited therapy: Pt's Hgb 6.9 with transfusion pending per nursing.  Will attempt to see pt at a future date/time as medically appropriate.     Linus Salmons PT, DPT 04/14/22, 4:24 PM

## 2022-04-14 NOTE — Progress Notes (Signed)
[facility] Cardiology    SUBJECTIVE: Patient feeling somewhat better still has chest tube in place reduced drainage continue inhalers as necessary.  Recommend weight loss exercise portion control continue with nephrology input for renal insufficiency patient states that his breathing is much better no fever chills or sweats still feels somewhat weak   Vitals:   04/13/22 2308 04/14/22 0309 04/14/22 0804 04/14/22 1221  BP: 124/68 (!) 107/55 97/67 124/61  Pulse: 87 85 81 86  Resp: '16 18 20 '$ (!) 22  Temp: 98.4 F (36.9 C) 98.2 F (36.8 C) 98.3 F (36.8 C)   TempSrc: Oral Oral    SpO2: 100% 100% 100% 100%  Weight:      Height:         Intake/Output Summary (Last 24 hours) at 04/14/2022 1532 Last data filed at 04/14/2022 1443 Gross per 24 hour  Intake 558.86 ml  Output 870 ml  Net -311.14 ml      PHYSICAL EXAM  General: Well developed, well nourished, in no acute distress HEENT:  Normocephalic and atramatic Neck:  No JVD.  Lungs: Clear bilaterally to auscultation and percussion. Heart: HRRR . Normal S1 and S2 without gallops or murmurs.  Abdomen: Bowel sounds are positive, abdomen soft and non-tender  Msk:  Back normal, normal gait. Normal strength and tone for age. Extremities: No clubbing, cyanosis or edema.   Neuro: Alert and oriented X 3. Psych:  Good affect, responds appropriately   LABS: Basic Metabolic Panel: Recent Labs    04/13/22 0520 04/14/22 0506  NA 132* 132*  K 4.0 3.8  CL 97* 100  CO2 24 26  GLUCOSE 253* 141*  BUN 76* 82*  CREATININE 2.03* 2.16*  CALCIUM 7.6* 7.4*  MG 2.2 2.4   Liver Function Tests: Recent Labs    04/11/22 2033 04/12/22 0500  AST 23 24  ALT 15 14  ALKPHOS 72 60  BILITOT 1.4* 1.0  PROT 5.9* 5.8*  ALBUMIN 2.5* 2.4*   No results for input(s): "LIPASE", "AMYLASE" in the last 72 hours. CBC: Recent Labs    04/11/22 2033 04/12/22 0500 04/13/22 0520 04/14/22 0506  WBC 5.3   < > 4.0 3.9*  NEUTROABS 4.5  --   --   --   HGB 8.1*    < > 7.3* 6.9*  HCT 25.8*   < > 23.5* 21.4*  MCV 89.9   < > 90.7 88.4  PLT 193   < > 195 180   < > = values in this interval not displayed.   Cardiac Enzymes: No results for input(s): "CKTOTAL", "CKMB", "CKMBINDEX", "TROPONINI" in the last 72 hours. BNP: Invalid input(s): "POCBNP" D-Dimer: No results for input(s): "DDIMER" in the last 72 hours. Hemoglobin A1C: Recent Labs    04/12/22 0500  HGBA1C 5.9*   Fasting Lipid Panel: No results for input(s): "CHOL", "HDL", "LDLCALC", "TRIG", "CHOLHDL", "LDLDIRECT" in the last 72 hours. Thyroid Function Tests: No results for input(s): "TSH", "T4TOTAL", "T3FREE", "THYROIDAB" in the last 72 hours.  Invalid input(s): "FREET3" Anemia Panel: Recent Labs    04/12/22 0500  VITAMINB12 846    DG Chest Port 1 View  Result Date: 04/14/2022 CLINICAL DATA:  Left-sided pleural effusion EXAM: PORTABLE CHEST 1 VIEW COMPARISON:  04/12/2022 FINDINGS: Cardiomegaly. Similar appearance of layering left pleural effusion and associated atelectasis or consolidation. Left-sided chest tube remains in position about the left lung base. No pneumothorax. No new airspace opacity. Osseous structures unremarkable. The visualized skeletal structures are unremarkable. IMPRESSION: 1. Similar appearance of layering  left pleural effusion and associated atelectasis or consolidation. Left-sided chest tube remains in position about the left lung base. No pneumothorax. No new airspace opacity. 2. Cardiomegaly. Electronically Signed   By: Delanna Ahmadi M.D.   On: 04/14/2022 12:58     Echo reduced left ventricular function globally continue heart failure therapy  TELEMETRY: Normal sinus rhythm nonspecific ST-T changes:  ASSESSMENT AND PLAN:  Principal Problem:   Influenza A Active Problems:   Iron deficiency anemia   Diabetes mellitus type 2 in obese (HCC)   CAD (coronary artery disease)   Essential (primary) hypertension   Hyperlipidemia, mixed   Obesity, Class III,  BMI 40-49.9 (morbid obesity) ([place])   Chronic kidney disease due to type 2 diabetes mellitus (HCC)   Edema of lower extremity   Acute on chronic systolic CHF (congestive heart failure) (HCC)   Acute renal failure superimposed on stage 2 chronic kidney disease (HCC)   Recurrent left pleural effusion   Acute hypoxemic respiratory failure (HCC)   Chronic membranous glomerulonephritis    Plan Influenza A lung infection possible pneumonia continue TheraFlu Acute on chronic congestive heart failure continue diuretic therapy as necessary Diabetes type 2 continue medical therapy for now Acute on chronic renal insufficiency appreciate nephrology input maintain adequate hydration Large pleural effusion probably secondary to pneumonia COPD congestive heart failure influenza A continue chest tube with drainage Obesity recommend weight loss exercise portion control Continue inhalers for hypoxemia and supplemental oxygen therapy Increase activity physical therapy ambulate when appropriate   Yolonda Kida, MD 04/14/2022 3:32 PM

## 2022-04-14 NOTE — Progress Notes (Signed)
[facility]  ROUNDING NOTE   Subjective:  Patient well-known to [place] as we follow him for idiopathic membranous glomerulonephritis with nephrotic syndrome, chronic kidney disease stage II with diabetes mellitus type 2, proteinuria, and lower extremity edema.  Patient came in with significant shortness of breath.  Found to be positive for the flu.  Has a left-sided chest tube in place.  Baseline creatinine 1.15 with EGFR of 67.  Most recent urine protein to creatinine ratio was down to 4.9.  He was to be evaluated by hematology for treatment with rituximab.  Patient states that he overall does not feel well.  Continues on Lasix drip.   Urine output from yesterday not recorded. Serum creatinine further increased to 2.16 today.    Objective:  Vital signs in last 24 hours:  Temp:  [97.8 F (36.6 C)-98.5 F (36.9 C)] 97.8 F (36.6 C) (01/30 1839) Pulse Rate:  [76-93] 76 (01/30 1839) Resp:  [16-22] 18 (01/30 1839) BP: (97-134)/(53-68) 131/68 (01/30 1839) SpO2:  [97 %-100 %] 100 % (01/30 1839)  Weight change:  Filed Weights   04/11/22 2039  Weight: (!) 138.7 kg    Intake/Output: I/O last 3 completed shifts: In: 318.9 [P.O.:240; I.V.:78.9] Out: 2520 [Urine:600; Chest Tube:1920]   Intake/Output this shift:  Total I/O In: 480 [P.O.:480] Out: 650 [Urine:300; Chest Tube:350]  Physical Exam: General: No acute distress  Head: Normocephalic, atraumatic. Moist oral mucosal membranes  Neck: Supple  Lungs:  Bibasilar rales, normal effort  Heart: S1S2 no rubs  Abdomen:  Soft, nontender, bowel sounds present  Extremities: 2+ brawny peripheral edema.  Neurologic: Awake, alert, following commands  Skin: No acute rash    Basic Metabolic Panel: Recent Labs  Lab 04/11/22 2033 04/12/22 0500 04/13/22 0520 04/14/22 0506  NA 134* 133* 132* 132*  K 4.2 4.1 4.0 3.8  CL 97* 98 97* 100  CO2 '22 24 24 26  '$ GLUCOSE 244* 224* 253* 141*  BUN 63* 69* 76* 82*  CREATININE 1.69*  1.92* 2.03* 2.16*  CALCIUM 8.0* 7.9* 7.6* 7.4*  MG  --   --  2.2 2.4     Liver Function Tests: Recent Labs  Lab 04/11/22 2033 04/12/22 0500  AST 23 24  ALT 15 14  ALKPHOS 72 60  BILITOT 1.4* 1.0  PROT 5.9* 5.8*  ALBUMIN 2.5* 2.4*    No results for input(s): "LIPASE", "AMYLASE" in the last 168 hours. No results for input(s): "AMMONIA" in the last 168 hours.  CBC: Recent Labs  Lab 04/11/22 2033 04/12/22 0500 04/13/22 0520 04/14/22 0506  WBC 5.3 5.5 4.0 3.9*  NEUTROABS 4.5  --   --   --   HGB 8.1* 8.0* 7.3* 6.9*  HCT 25.8* 26.0* 23.5* 21.4*  MCV 89.9 92.5 90.7 88.4  PLT 193 189 195 180     Cardiac Enzymes: No results for input(s): "CKTOTAL", "CKMB", "CKMBINDEX", "TROPONINI" in the last 168 hours.  BNP: Invalid input(s): "POCBNP"  CBG: Recent Labs  Lab 04/13/22 1622 04/13/22 2057 04/14/22 0806 04/14/22 1222 04/14/22 1711  GLUCAP 154* 212* 148* 199* 164*     Microbiology: Results for orders placed or performed during the hospital encounter of 04/11/22  Resp panel by RT-PCR (RSV, Flu A&B, Covid) Anterior Nasal Swab     Status: Abnormal   Collection Time: 04/11/22  8:34 PM   Specimen: Anterior Nasal Swab  Result Value Ref Range Status   SARS Coronavirus 2 by RT PCR NEGATIVE NEGATIVE Final    Comment: (NOTE) SARS-CoV-2  target nucleic acids are NOT DETECTED.  The SARS-CoV-2 RNA is generally detectable in upper respiratory specimens during the acute phase of infection. The lowest concentration of SARS-CoV-2 viral copies this assay can detect is 138 copies/mL. A negative result does not preclude SARS-Cov-2 infection and should not be used as the sole basis for treatment or other patient management decisions. A negative result may occur with  improper specimen collection/handling, submission of specimen other than nasopharyngeal swab, presence of viral mutation(s) within the areas targeted by this assay, and inadequate number of viral copies(<138  copies/mL). A negative result must be combined with clinical observations, patient history, and epidemiological information. The expected result is Negative.  Fact Sheet for Patients:  EntrepreneurPulse.com.au  Fact Sheet for Healthcare Providers:  IncredibleEmployment.be  This test is no t yet approved or cleared by the [place] FDA and  has been authorized for detection and/or diagnosis of SARS-CoV-2 by FDA under an Emergency Use Authorization (EUA). This EUA will remain  in effect (meaning this test can be used) for the duration of the COVID-19 declaration under Section 564(b)(1) of the Act, 21 U.S.C.section 360bbb-3(b)(1), unless the authorization is terminated  or revoked sooner.       Influenza A by PCR POSITIVE (A) NEGATIVE Final   Influenza B by PCR NEGATIVE NEGATIVE Final    Comment: (NOTE) The Xpert Xpress SARS-CoV-2/FLU/RSV plus assay is intended as an aid in the diagnosis of influenza from Nasopharyngeal swab specimens and should not be used as a sole basis for treatment. Nasal washings and aspirates are unacceptable for Xpert Xpress SARS-CoV-2/FLU/RSV testing.  Fact Sheet for Patients: EntrepreneurPulse.com.au  Fact Sheet for Healthcare Providers: IncredibleEmployment.be  This test is not yet approved or cleared by the [place] FDA and has been authorized for detection and/or diagnosis of SARS-CoV-2 by FDA under an Emergency Use Authorization (EUA). This EUA will remain in effect (meaning this test can be used) for the duration of the COVID-19 declaration under Section 564(b)(1) of the Act, 21 U.S.C. section 360bbb-3(b)(1), unless the authorization is terminated or revoked.     Resp Syncytial Virus by PCR NEGATIVE NEGATIVE Final    Comment: (NOTE) Fact Sheet for Patients: EntrepreneurPulse.com.au  Fact Sheet for Healthcare  Providers: IncredibleEmployment.be  This test is not yet approved or cleared by the [place] FDA and has been authorized for detection and/or diagnosis of SARS-CoV-2 by FDA under an Emergency Use Authorization (EUA). This EUA will remain in effect (meaning this test can be used) for the duration of the COVID-19 declaration under Section 564(b)(1) of the Act, 21 U.S.C. section 360bbb-3(b)(1), unless the authorization is terminated or revoked.  Performed at [facility], [address] [place], [place] [address]   Culture, blood (routine x 2)     Status: None (Preliminary result)   Collection Time: 04/11/22  9:59 PM   Specimen: BLOOD LEFT ARM  Result Value Ref Range Status   Specimen Description BLOOD LEFT ARM  Final   Special Requests   Final    BOTTLES DRAWN AEROBIC AND ANAEROBIC Blood Culture adequate volume   Culture   Final    NO GROWTH 3 DAYS Performed at [facility], [address] [place], [place] [address]    Report Status PENDING  Incomplete  Culture, blood (routine x 2)     Status: None (Preliminary result)   Collection Time: 04/11/22 10:01 PM   Specimen: BLOOD LEFT HAND  Result Value Ref Range Status   Specimen Description BLOOD LEFT  HAND  Final   Special Requests   Final    BOTTLES DRAWN AEROBIC AND ANAEROBIC Blood Culture results may not be optimal due to an inadequate volume of blood received in culture bottles   Culture   Final    NO GROWTH 3 DAYS Performed at [facility], [address] [place], [place] [address]    Report Status PENDING  Incomplete    Coagulation Studies: No results for input(s): "LABPROT", "INR" in the last 72 hours.  Urinalysis: No results for input(s): "COLORURINE", "LABSPEC", "PHURINE", "GLUCOSEU", "HGBUR", "BILIRUBINUR", "KETONESUR", "PROTEINUR", "UROBILINOGEN", "NITRITE", "LEUKOCYTESUR" in the last 72 hours.  Invalid input(s): "APPERANCEUR"    Imaging: DG Chest Port 1  View  Result Date: 04/14/2022 CLINICAL DATA:  Left-sided pleural effusion EXAM: PORTABLE CHEST 1 VIEW COMPARISON:  04/12/2022 FINDINGS: Cardiomegaly. Similar appearance of layering left pleural effusion and associated atelectasis or consolidation. Left-sided chest tube remains in position about the left lung base. No pneumothorax. No new airspace opacity. Osseous structures unremarkable. The visualized skeletal structures are unremarkable. IMPRESSION: 1. Similar appearance of layering left pleural effusion and associated atelectasis or consolidation. Left-sided chest tube remains in position about the left lung base. No pneumothorax. No new airspace opacity. 2. Cardiomegaly. Electronically Signed   By: Delanna Ahmadi M.D.   On: 04/14/2022 12:58     Medications:    furosemide (LASIX) 200 mg in dextrose 5 % 100 mL (2 mg/mL) infusion 4 mg/hr (04/14/22 0507)   methocarbamol (ROBAXIN) IV      sodium chloride   Intravenous Once   atorvastatin  80 mg Oral Daily   enoxaparin (LOVENOX) injection  0.5 mg/kg Subcutaneous Q24H   insulin aspart  0-15 Units Subcutaneous TID WC   insulin aspart  0-5 Units Subcutaneous QHS   insulin glargine-yfgn  25 Units Subcutaneous QHS   midodrine  5 mg Oral TID WC   oseltamivir  30 mg Oral Daily   sodium chloride flush  3 mL Intravenous Q12H   acetaminophen **OR** acetaminophen, guaiFENesin-dextromethorphan, HYDROmorphone (DILAUDID) injection, methocarbamol (ROBAXIN) IV, ondansetron **OR** ondansetron (ZOFRAN) IV, oxyCODONE  Assessment/ Plan:  72 y.o. male with past medical history of diabetes mellitus type 2, morbid obesity, hypertension, coronary artery disease status post stent placement, cardiomyopathy, low testosterone, hyperlipidemia, history of first left toe osteomyelitis with amputation, hydronephrosis, osteoarthritis of the right hip, history of idiopathic membranous glomerulonephritis who presents with increasing shortness of breath.  1.  Idiopathic  membranous glomerulonephritis/ Acute kidney injury on chronic kidney disease stage II/proteinuria most recent urine protein to creatinine ratio 4.9.  Patient was in the process of being evaluated for rituximab treatment of his idiopathic membranous pyelonephritis.  Now has ongoing shortness of breath.   Will hold off rituximab administration due to concurrent illness.  2. Lower extremity edema. Continued on IV Lasix drip at 4 mg/h.  Monitor volume status closely as well as renal function.  3.  Anemia of chronic kidney disease.  Hemoglobin currently 6.9.  He will likely need to be evaluated for Retacrit as an outpatient.  Consider blood transfusion for hemoglobin of 7 or less.  4.  Acute on chronic combined systolic and diastolic CHF, acute hypoxemic respiratory failure, volume overload, coronary artery disease. Volume management with Lasix drip as above. Recurrent left-sided pleural effusion secondary to nephrotic syndrome, third spacing of fluid.  Currently managed with left chest tube.  5.  Influenza A On Tamiflu.    LOS: 2 Albino Bufford 1/30/20246:50 PM

## 2022-04-14 NOTE — Progress Notes (Signed)
OT Cancellation Note  Patient Details Name: [name] MRN: [id number] DOB: Sep 08, 1950   Cancelled Treatment:    Reason Eval/Treat Not Completed: Medical issues which prohibited therapy. OT orders received, chart reviewed. Pt's Hgb 6.9 with transfusion pending per nursing.  Will re-attempt evaluation at later date/time as long as pt medically appropriate.    Doneta Public 04/14/2022, 4:37 PM

## 2022-04-14 NOTE — Progress Notes (Signed)
PULMONOLOGY         Date: 04/14/2022,   MRN# [id number] [name] [birth year]     AdmissionWeight: (!) 138.7 kg                 CurrentWeight: (!) 138.7 kg  Referring provider: Dr Roosevelt Locks   CHIEF COMPLAINT:   Parapneumonic effusion s/p chest tube placement    HISTORY OF PRESENT ILLNESS   This is a pleasant 72 year old male with severe morbid obesity chronic 3+ weeping lower extremity edema due to CKD with minimal-change disease and recent evaluation with nephrology for rituximab infusion, history of CHF with reduced EF at 40%, osteoarthritis, diabetes, history of melanoma, neuropathy, spinal stenosis with chief complaint of shortness of breath and chest discomfort.  Patient is well-known to our pulmonology service with [facility] we recently saw him for acute on chronic hypoxemic respiratory failure noted to have left-sided large effusion and right-sided mild to moderate effusion.  Status postthoracentesis x 2 with over 1 L drained.  This is a third time that he is shown to have a large pleural effusion in stent 3 months.  He did receive pleural drain upon arrival to the ER with 1500 cc serosanguineous return overnight and active drainage this a.m. and up to 150 cc before lunchtime.  On interview and physical exam patient reports slight improvement mild chest discomfort at pigtail insertion site.  Chest tube management displays no airleak upon forced expiratory maneuver.   04/14/22- patient examined at bedside, s/p repeat CBC with severe anemia requiring PRBC transfusion. S/p [place] abd no ascites. Chest tube in place on left , persistent and recurrent effusion present on CXR 1/28.  Will repeat CXR again today.  On Lasix gtt with 600cc urine overnight. Development of transient hypotension with req for midodrine.   PAST MEDICAL HISTORY   Past Medical History:  Diagnosis Date   Arthritis    CHF (congestive heart failure), NYHA class II, acute on chronic, systolic (HCC)     EF 72% 07/2018   Coronary artery disease    [facility]   Diabetes mellitus without complication ([place])    Dysplastic nevus 08/26/2020   L flank, moderate atypia   Hyperlipidemia    Hypertension    Melanoma ([place]) 08/20/2015   Right neck. Superficial spreading, arising in nevus. Tumor thickness 1.19m, Anatomic level IV   [place]   R lateral neck ant to scar, exc 05/22/20   Numbness in right leg    due to back   Skin cancer    removed l arm   Spinal stenosis    Stented coronary artery 2000   X 1 STENT   Swelling of both lower extremities      SURGICAL HISTORY   Past Surgical History:  Procedure Laterality Date   CATARACT EXTRACTION W/PHACO Right 05/03/2019   Procedure: CATARACT EXTRACTION PHACO AND INTRAOCULAR LENS PLACEMENT (IOC) RIGHT DIABETIC 6.44 00:58.4 11.0%;  Surgeon: BLeandrew Koyanagi MD;  [facility]  Service: Ophthalmology;  Laterality: Right;  DIABETIC   CATARACT EXTRACTION W/PHACO Left 06/28/2019   Procedure: CATARACT EXTRACTION PHACO AND INTRAOCULAR LENS PLACEMENT (IOC) LEFT DIABETIC 7.99  01:13.4  10.9% ;  Surgeon: BLeandrew Koyanagi MD;  [place] [facility]  Service: Ophthalmology;  Laterality: Left;  Diabetic - insulin and oral meds   CHOLECYSTECTOMY     JOINT REPLACEMENT     RT KNEE   LUMBAR LAMINECTOMY/DECOMPRESSION MICRODISCECTOMY  04/13/2012   Procedure:  LUMBAR LAMINECTOMY/DECOMPRESSION MICRODISCECTOMY 1 LEVEL;  Surgeon: Johnn Hai, MD;  Location: [facility];  Service: Orthopedics;  Laterality: Bilateral;  L4-L5   UVULECTOMY  1990's     FAMILY HISTORY   History reviewed. No pertinent family history.   SOCIAL HISTORY   Social History   Tobacco Use   Smoking status: Never   Smokeless tobacco: Never  Vaping Use   Vaping Use: Never used  Substance Use Topics   Alcohol use: Yes    Comment: rare   Drug use: No     MEDICATIONS    Home Medication:     Current  Medication:  Current Facility-Administered Medications:    0.9 %  sodium chloride infusion (Manually program via Guardrails IV Fluids), , Intravenous, Once, Sharen Hones, MD   acetaminophen (TYLENOL) tablet 650 mg, 650 mg, Oral, Q6H PRN **OR** acetaminophen (TYLENOL) suppository 650 mg, 650 mg, Rectal, Q6H PRN, Sid Falcon, MD   albumin human 25 % solution 25 g, 25 g, Intravenous, Q6H, Sharen Hones, MD, Last Rate: 60 mL/hr at 04/14/22 1110, 25 g at 04/14/22 1110   atorvastatin (LIPITOR) tablet 80 mg, 80 mg, Oral, Daily, Gilles Chiquito B, MD, 80 mg at 04/14/22 1000   enoxaparin (LOVENOX) injection 70 mg, 0.5 mg/kg, Subcutaneous, Q24H, Gilles Chiquito B, MD, 70 mg at 04/14/22 1002   furosemide (LASIX) 200 mg in dextrose 5 % 100 mL (2 mg/mL) infusion, 4 mg/hr, Intravenous, Continuous, Lateef, Munsoor, MD, Last Rate: 2 mL/hr at 04/14/22 0507, 4 mg/hr at 04/14/22 0507   guaiFENesin-dextromethorphan (ROBITUSSIN DM) 100-10 MG/5ML syrup 5 mL, 5 mL, Oral, Q4H PRN, Gilles Chiquito B, MD, 5 mL at 04/13/22 2154   HYDROmorphone (DILAUDID) injection 0.5 mg, 0.5 mg, Intravenous, Once, Gilles Chiquito B, MD   HYDROmorphone (DILAUDID) injection 0.5-1 mg, 0.5-1 mg, Intravenous, Q2H PRN, Gilles Chiquito B, MD, 1 mg at 04/12/22 0257   insulin aspart (novoLOG) injection 0-15 Units, 0-15 Units, Subcutaneous, TID WC, Gilles Chiquito B, MD, 2 Units at 04/14/22 1000   insulin aspart (novoLOG) injection 0-5 Units, 0-5 Units, Subcutaneous, QHS, Gilles Chiquito B, MD, 2 Units at 04/13/22 2153   insulin glargine-yfgn (SEMGLEE) injection 25 Units, 25 Units, Subcutaneous, QHS, Sharen Hones, MD, 25 Units at 04/13/22 2154   methocarbamol (ROBAXIN) 500 mg in dextrose 5 % 50 mL IVPB, 500 mg, Intravenous, Q6H PRN, Sid Falcon, MD   midodrine (PROAMATINE) tablet 5 mg, 5 mg, Oral, TID WC, Singh, Harmeet, MD   ondansetron (ZOFRAN) tablet 4 mg, 4 mg, Oral, Q6H PRN **OR** ondansetron (ZOFRAN) injection 4 mg, 4 mg, Intravenous, Q6H PRN, Sid Falcon, MD   oseltamivir (TAMIFLU) capsule 30 mg, 30 mg, Oral, Daily, Gilles Chiquito B, MD, 30 mg at 04/14/22 1001   oxyCODONE (Oxy IR/ROXICODONE) immediate release tablet 5 mg, 5 mg, Oral, Q4H PRN, Gilles Chiquito B, MD, 5 mg at 04/13/22 2154   sodium chloride flush (NS) 0.9 % injection 3 mL, 3 mL, Intravenous, Q12H, Gilles Chiquito B, MD, 3 mL at 04/14/22 1113    ALLERGIES   Carvedilol, Metoprolol, Toprol xl [metoprolol tartrate], Buprenorphine, Buprenorphine hcl, Morphine, Morphine and related, Triamcinolone, and Triamcinolone acetonide     REVIEW OF SYSTEMS    Review of Systems:  Gen:  Denies  fever, sweats, chills weigh loss  HEENT: Denies blurred vision, double vision, ear pain, eye pain, hearing loss, nose bleeds, sore throat Cardiac:  No dizziness, chest pain or heaviness, chest tightness,edema Resp:   reports dyspnea chronically  Gi:  Denies swallowing difficulty, stomach pain, nausea or vomiting, diarrhea, constipation, bowel incontinence Gu:  Denies bladder incontinence, burning urine Ext:   Denies Joint pain, stiffness or swelling Skin: Denies  skin rash, easy bruising or bleeding or hives Endoc:  Denies polyuria, polydipsia , polyphagia or weight change Psych:   Denies depression, insomnia or hallucinations   Other:  All other systems negative   VS: BP 97/67 (BP Location: Left Arm)   Pulse 81   Temp 98.3 F (36.8 C)   Resp 20   Ht 6' (1.829 m)   Wt (!) 138.7 kg   SpO2 100%   BMI 41.47 kg/m      PHYSICAL EXAM    GENERAL:NAD, no fevers, chills, no weakness no fatigue HEAD: Normocephalic, atraumatic.  EYES: Pupils equal, round, reactive to light. Extraocular muscles intact. No scleral icterus.  MOUTH: Moist mucosal membrane. Dentition intact. No abscess noted.  EAR, NOSE, THROAT: Clear without exudates. No external lesions.  NECK: Supple. No thyromegaly. No nodules. No JVD.  PULMONARY: decreased breath sounds with mild rhonchi worse at bases bilaterally.   CARDIOVASCULAR: S1 and S2. Regular rate and rhythm. No murmurs, rubs, or gallops. No edema. Pedal pulses 2+ bilaterally.  GASTROINTESTINAL: Soft, nontender, nondistended. No masses. Positive bowel sounds. No hepatosplenomegaly.  MUSCULOSKELETAL: No swelling, clubbing, or edema. Range of motion full in all extremities.  NEUROLOGIC: Cranial nerves II through XII are intact. No gross focal neurological deficits. Sensation intact. Reflexes intact.  SKIN: No ulceration, lesions, rashes, or cyanosis. Skin warm and dry. Turgor intact.  PSYCHIATRIC: Mood, affect within normal limits. The patient is awake, alert and oriented x 3. Insight, judgment intact.       IMAGING     ASSESSMENT/PLAN   Acute on chronic hypoxemic respiratory failure Present on admission due to influenza A infection with recurrent left pleural effusion and associated compressive atelectasis -Patient's evaluated by nephrology and is status post Lasix drip as well as pigtail chest tube on the left with 1500 cc serosanguineous fluid overnight and active drain during examination today -Is no air leak upon forced aspiratory maneuver, chest tube is not clogged and fluid is freely flowing. -Fluid was previously tested and was noted to be lymphocyte predominant fluid and became exudative upon initiation of diuretics which is known to happen.  Does not appear to be empyema and I do not think he is a very good candidate for intrapleural thrombolytics.  Patient may be heading towards dialysis but if not he may also benefit from thoracic surgery evaluation and possible pleurodesis.  Discussed this care plan with son and patient at bedside as well as attending physician Dr. Roosevelt Locks    Thank you for allowing me to participate in the care of this patient.   Patient/Family are satisfied with care plan and all questions have been answered.    Provider disclosure: Patient with at least one acute or chronic illness or injury that poses a threat  to life or bodily function and is being managed actively during this encounter.  All of the below services have been performed independently by signing provider:  review of prior documentation from internal and or external health records.  Review of previous and current lab results.  Interview and comprehensive assessment during patient visit today. Review of current and previous chest radiographs/CT scans. Discussion of management and test interpretation with health care team and patient/family.   This document was prepared using Dragon voice recognition software and may include unintentional dictation errors.  Ottie Glazier, M.D.  Division of Pulmonary & Critical Care Medicine

## 2022-04-15 ENCOUNTER — Ambulatory Visit: Payer: Medicare HMO | Admitting: *Deleted

## 2022-04-15 ENCOUNTER — Inpatient Hospital Stay: Payer: Medicare HMO

## 2022-04-15 DIAGNOSIS — J101 Influenza due to other identified influenza virus with other respiratory manifestations: Secondary | ICD-10-CM | POA: Diagnosis not present

## 2022-04-15 LAB — CBC
HCT: 24.3 % — ABNORMAL LOW (ref 39.0–52.0)
Hemoglobin: 7.7 g/dL — ABNORMAL LOW (ref 13.0–17.0)
MCH: 28 pg (ref 26.0–34.0)
MCHC: 31.7 g/dL (ref 30.0–36.0)
MCV: 88.4 fL (ref 80.0–100.0)
Platelets: 204 10*3/uL (ref 150–400)
RBC: 2.75 MIL/uL — ABNORMAL LOW (ref 4.22–5.81)
RDW: 16.1 % — ABNORMAL HIGH (ref 11.5–15.5)
WBC: 5.1 10*3/uL (ref 4.0–10.5)
nRBC: 0 % (ref 0.0–0.2)

## 2022-04-15 LAB — GLUCOSE, CAPILLARY
Glucose-Capillary: 159 mg/dL — ABNORMAL HIGH (ref 70–99)
Glucose-Capillary: 200 mg/dL — ABNORMAL HIGH (ref 70–99)
Glucose-Capillary: 182 mg/dL — ABNORMAL HIGH (ref 70–99)
Glucose-Capillary: 182 mg/dL — ABNORMAL HIGH (ref 70–99)

## 2022-04-15 LAB — MAGNESIUM: Magnesium: 2.4 mg/dL (ref 1.7–2.4)

## 2022-04-15 LAB — TYPE AND SCREEN
ABO/RH(D): A POS
Antibody Screen: NEGATIVE
Unit division: 0

## 2022-04-15 LAB — COMPREHENSIVE METABOLIC PANEL
ALT: 11 U/L (ref 0–44)
AST: 20 U/L (ref 15–41)
Albumin: 2.9 g/dL — ABNORMAL LOW (ref 3.5–5.0)
Alkaline Phosphatase: 60 U/L (ref 38–126)
Anion gap: 7 (ref 5–15)
BUN: 83 mg/dL — ABNORMAL HIGH (ref 8–23)
CO2: 29 mmol/L (ref 22–32)
Calcium: 7.6 mg/dL — ABNORMAL LOW (ref 8.9–10.3)
Chloride: 99 mmol/L (ref 98–111)
Creatinine, Ser: 2.26 mg/dL — ABNORMAL HIGH (ref 0.61–1.24)
GFR, Estimated: 30 mL/min — ABNORMAL LOW (ref >60)
Glucose, Bld: 148 mg/dL — ABNORMAL HIGH (ref 70–99)
Potassium: 3.8 mmol/L (ref 3.5–5.1)
Sodium: 135 mmol/L (ref 135–145)
Total Bilirubin: 1.2 mg/dL (ref 0.3–1.2)
Total Protein: 5.6 g/dL — ABNORMAL LOW (ref 6.5–8.1)

## 2022-04-15 LAB — BPAM RBC
Blood Product Expiration Date: 202402222359
ISSUE DATE / TIME: 202401301836
Unit Type and Rh: 6200

## 2022-04-15 MED ORDER — ALBUMIN HUMAN 25 % IV SOLN
INTRAVENOUS | Status: AC
  Filled 2022-04-15: qty 50

## 2022-04-15 MED ORDER — ALBUMIN HUMAN 25 % IV SOLN
25.0000 g | Freq: Two times a day (BID) | INTRAVENOUS | Status: AC
  Administered 2022-04-15 – 2022-04-16 (×3): 25 g via INTRAVENOUS
  Filled 2022-04-15 (×4): qty 100

## 2022-04-15 MED ORDER — VITAMIN C 500 MG PO TABS
500.0000 mg | ORAL_TABLET | Freq: Every day | ORAL | Status: DC
  Administered 2022-04-15 – 2022-04-21 (×7): 500 mg via ORAL
  Filled 2022-04-15 (×7): qty 1

## 2022-04-15 MED ORDER — POLYSACCHARIDE IRON COMPLEX 150 MG PO CAPS
150.0000 mg | ORAL_CAPSULE | Freq: Every day | ORAL | Status: DC
  Administered 2022-04-15 – 2022-04-26 (×12): 150 mg via ORAL
  Filled 2022-04-15 (×12): qty 1

## 2022-04-15 NOTE — Progress Notes (Signed)
[facility]  ROUNDING NOTE   Subjective:  Patient well-known to [place] as we follow him for idiopathic membranous glomerulonephritis with nephrotic syndrome, chronic kidney disease stage II with diabetes mellitus type 2, proteinuria, and lower extremity edema.  Patient came in with significant shortness of breath.  Found to be positive for the flu.  Has a left-sided chest tube in place.  Baseline creatinine 1.15 with EGFR of 67.  Most recent urine protein to creatinine ratio was down to 4.9.  He was to be evaluated by hematology for treatment with rituximab.  Patient sitting up in chair. Remains on Lasix drip. Creatinine currently 2.2.   Objective:  Vital signs in last 24 hours:  Temp:  [97.4 F (36.3 C)-98.5 F (36.9 C)] 98.1 F (36.7 C) (01/31 0737) Pulse Rate:  [73-88] 88 (01/31 0737) Resp:  [14-22] 18 (01/31 0737) BP: (120-138)/(53-70) 138/70 (01/31 0737) SpO2:  [91 %-100 %] 97 % (01/31 0737)  Weight change:  Filed Weights   04/11/22 2039  Weight: (!) 138.7 kg    Intake/Output: I/O last 3 completed shifts: In: 1164.9 [P.O.:720; I.V.:78.9; Blood:366] Out: 1950 [Urine:1000; Chest Tube:950]   Intake/Output this shift:  Total I/O In: 480 [P.O.:480] Out: 250 [Urine:250]  Physical Exam: General: No acute distress  Head: Normocephalic, atraumatic. Moist oral mucosal membranes  Neck: Supple  Lungs:  Bibasilar rales, normal effort  Heart: S1S2 no rubs  Abdomen:  Soft, nontender, bowel sounds present  Extremities: 2+ brawny peripheral edema.  Neurologic: Awake, alert, following commands  Skin: No acute rash    Basic Metabolic Panel: Recent Labs  Lab 04/11/22 2033 04/12/22 0500 04/13/22 0520 04/14/22 0506 04/15/22 0235  NA 134* 133* 132* 132* 135  K 4.2 4.1 4.0 3.8 3.8  CL 97* 98 97* 100 99  CO2 '22 24 24 26 29  '$ GLUCOSE 244* 224* 253* 141* 148*  BUN 63* 69* 76* 82* 83*  CREATININE 1.69* 1.92* 2.03* 2.16* 2.26*  CALCIUM 8.0* 7.9* 7.6* 7.4* 7.6*  MG   --   --  2.2 2.4 2.4     Liver Function Tests: Recent Labs  Lab 04/11/22 2033 04/12/22 0500 04/15/22 0235  AST '23 24 20  '$ ALT '15 14 11  '$ ALKPHOS 72 60 60  BILITOT 1.4* 1.0 1.2  PROT 5.9* 5.8* 5.6*  ALBUMIN 2.5* 2.4* 2.9*    No results for input(s): "LIPASE", "AMYLASE" in the last 168 hours. No results for input(s): "AMMONIA" in the last 168 hours.  CBC: Recent Labs  Lab 04/11/22 2033 04/12/22 0500 04/13/22 0520 04/14/22 0506 04/15/22 0235  WBC 5.3 5.5 4.0 3.9* 5.1  NEUTROABS 4.5  --   --   --   --   HGB 8.1* 8.0* 7.3* 6.9* 7.7*  HCT 25.8* 26.0* 23.5* 21.4* 24.3*  MCV 89.9 92.5 90.7 88.4 88.4  PLT 193 189 195 180 204     Cardiac Enzymes: No results for input(s): "CKTOTAL", "CKMB", "CKMBINDEX", "TROPONINI" in the last 168 hours.  BNP: Invalid input(s): "POCBNP"  CBG: Recent Labs  Lab 04/14/22 1222 04/14/22 1626 04/14/22 1711 04/14/22 2104 04/15/22 0735  GLUCAP 199* 161* 164* 168* 159*     Microbiology: Results for orders placed or performed during the hospital encounter of 04/11/22  Resp panel by RT-PCR (RSV, Flu A&B, Covid) Anterior Nasal Swab     Status: Abnormal   Collection Time: 04/11/22  8:34 PM   Specimen: Anterior Nasal Swab  Result Value Ref Range Status   SARS Coronavirus 2 by RT  PCR NEGATIVE NEGATIVE Final    Comment: (NOTE) SARS-CoV-2 target nucleic acids are NOT DETECTED.  The SARS-CoV-2 RNA is generally detectable in upper respiratory specimens during the acute phase of infection. The lowest concentration of SARS-CoV-2 viral copies this assay can detect is 138 copies/mL. A negative result does not preclude SARS-Cov-2 infection and should not be used as the sole basis for treatment or other patient management decisions. A negative result may occur with  improper specimen collection/handling, submission of specimen other than nasopharyngeal swab, presence of viral mutation(s) within the areas targeted by this assay, and inadequate  number of viral copies(<138 copies/mL). A negative result must be combined with clinical observations, patient history, and epidemiological information. The expected result is Negative.  Fact Sheet for Patients:  EntrepreneurPulse.com.au  Fact Sheet for Healthcare Providers:  IncredibleEmployment.be  This test is no t yet approved or cleared by the [place] FDA and  has been authorized for detection and/or diagnosis of SARS-CoV-2 by FDA under an Emergency Use Authorization (EUA). This EUA will remain  in effect (meaning this test can be used) for the duration of the COVID-19 declaration under Section 564(b)(1) of the Act, 21 U.S.C.section 360bbb-3(b)(1), unless the authorization is terminated  or revoked sooner.       Influenza A by PCR POSITIVE (A) NEGATIVE Final   Influenza B by PCR NEGATIVE NEGATIVE Final    Comment: (NOTE) The Xpert Xpress SARS-CoV-2/FLU/RSV plus assay is intended as an aid in the diagnosis of influenza from Nasopharyngeal swab specimens and should not be used as a sole basis for treatment. Nasal washings and aspirates are unacceptable for Xpert Xpress SARS-CoV-2/FLU/RSV testing.  Fact Sheet for Patients: EntrepreneurPulse.com.au  Fact Sheet for Healthcare Providers: IncredibleEmployment.be  This test is not yet approved or cleared by the [place] FDA and has been authorized for detection and/or diagnosis of SARS-CoV-2 by FDA under an Emergency Use Authorization (EUA). This EUA will remain in effect (meaning this test can be used) for the duration of the COVID-19 declaration under Section 564(b)(1) of the Act, 21 U.S.C. section 360bbb-3(b)(1), unless the authorization is terminated or revoked.     Resp Syncytial Virus by PCR NEGATIVE NEGATIVE Final    Comment: (NOTE) Fact Sheet for Patients: EntrepreneurPulse.com.au  Fact Sheet for Healthcare  Providers: IncredibleEmployment.be  This test is not yet approved or cleared by the [place] FDA and has been authorized for detection and/or diagnosis of SARS-CoV-2 by FDA under an Emergency Use Authorization (EUA). This EUA will remain in effect (meaning this test can be used) for the duration of the COVID-19 declaration under Section 564(b)(1) of the Act, 21 U.S.C. section 360bbb-3(b)(1), unless the authorization is terminated or revoked.  Performed at [facility], [address] [place], [place] [address]   Culture, blood (routine x 2)     Status: None (Preliminary result)   Collection Time: 04/11/22  9:59 PM   Specimen: BLOOD LEFT ARM  Result Value Ref Range Status   Specimen Description BLOOD LEFT ARM  Final   Special Requests   Final    BOTTLES DRAWN AEROBIC AND ANAEROBIC Blood Culture adequate volume   Culture   Final    NO GROWTH 4 DAYS Performed at [facility], [address] [place],  [address]    Report Status PENDING  Incomplete  Culture, blood (routine x 2)     Status: None (Preliminary result)   Collection Time: 04/11/22 10:01 PM   Specimen: BLOOD LEFT HAND  Result  Value Ref Range Status   Specimen Description BLOOD LEFT HAND  Final   Special Requests   Final    BOTTLES DRAWN AEROBIC AND ANAEROBIC Blood Culture results may not be optimal due to an inadequate volume of blood received in culture bottles   Culture   Final    NO GROWTH 4 DAYS Performed at [facility], [address], [place], [place] [address]    Report Status PENDING  Incomplete    Coagulation Studies: No results for input(s): "LABPROT", "INR" in the last 72 hours.  Urinalysis: No results for input(s): "COLORURINE", "LABSPEC", "PHURINE", "GLUCOSEU", "HGBUR", "BILIRUBINUR", "KETONESUR", "PROTEINUR", "UROBILINOGEN", "NITRITE", "LEUKOCYTESUR" in the last 72 hours.  Invalid input(s): "APPERANCEUR"    Imaging: DG Chest Port 1  View  Result Date: 04/14/2022 CLINICAL DATA:  Left-sided pleural effusion EXAM: PORTABLE CHEST 1 VIEW COMPARISON:  04/12/2022 FINDINGS: Cardiomegaly. Similar appearance of layering left pleural effusion and associated atelectasis or consolidation. Left-sided chest tube remains in position about the left lung base. No pneumothorax. No new airspace opacity. Osseous structures unremarkable. The visualized skeletal structures are unremarkable. IMPRESSION: 1. Similar appearance of layering left pleural effusion and associated atelectasis or consolidation. Left-sided chest tube remains in position about the left lung base. No pneumothorax. No new airspace opacity. 2. Cardiomegaly. Electronically Signed   By: Delanna Ahmadi M.D.   On: 04/14/2022 12:58     Medications:    albumin human     furosemide (LASIX) 200 mg in dextrose 5 % 100 mL (2 mg/mL) infusion 4 mg/hr (04/14/22 0507)   methocarbamol (ROBAXIN) IV      sodium chloride   Intravenous Once   vitamin C  500 mg Oral Daily   atorvastatin  80 mg Oral Daily   enoxaparin (LOVENOX) injection  0.5 mg/kg Subcutaneous Q24H   insulin aspart  0-15 Units Subcutaneous TID WC   insulin aspart  0-5 Units Subcutaneous QHS   insulin glargine-yfgn  25 Units Subcutaneous QHS   iron polysaccharides  150 mg Oral Daily   midodrine  5 mg Oral TID WC   oseltamivir  30 mg Oral Daily   sodium chloride flush  3 mL Intravenous Q12H   acetaminophen **OR** acetaminophen, guaiFENesin-dextromethorphan, HYDROmorphone (DILAUDID) injection, methocarbamol (ROBAXIN) IV, ondansetron **OR** ondansetron (ZOFRAN) IV, oxyCODONE  Assessment/ Plan:  72 y.o. male with past medical history of diabetes mellitus type 2, morbid obesity, hypertension, coronary artery disease status post stent placement, cardiomyopathy, low testosterone, hyperlipidemia, history of first left toe osteomyelitis with amputation, hydronephrosis, osteoarthritis of the right hip, history of idiopathic membranous  glomerulonephritis who presents with increasing shortness of breath.  1.  Idiopathic membranous glomerulonephritis/ Acute kidney injury on chronic kidney disease stage II/proteinuria most recent urine protein to creatinine ratio 4.9.  Patient was in the process of being evaluated for rituximab treatment of his idiopathic membranous glomerulonephritis. -Hold off on administering rituximab at this time.  2. Lower extremity edema. Continue Lasix 4 mg/h for now.  Patient also maintained on albumin given third spacing of fluid.  3.  Anemia of chronic kidney disease.  Hemoglobin up to 7.7 post blood transfusion.  4.  Acute on chronic combined systolic and diastolic CHF, acute hypoxemic respiratory failure, volume overload, coronary artery disease. Volume management with Lasix drip as above. Recurrent left-sided pleural effusion secondary to nephrotic syndrome, third spacing of fluid.  Currently managed with left chest tube.  5.  Influenza A On Tamiflu.    LOS: 3 [name] 1/31/202411:06 AM

## 2022-04-15 NOTE — Evaluation (Signed)
Occupational Therapy Evaluation Patient Details Name: [name] MRN: [id number] DOB: 11-08-1950 Today's Date: 04/15/2022   History of Present Illness Pt is a 72 y.o. male with medical history significant of arthritis, CHF, CAD, DM2, HLD, HTN, treated melanoma, and spinal stenosis who presents for worsening cough, fatigue and SOB. MD assessment includes: Influenza A, hypoxic respiratory failure, hemothorax s/p L chest tube placement, AKI, acute on chronic anemia, hyperglycemia, type 2 NSTEMI related to hemothorax, blood loss, and tachycardia, and edema of the LE.   Clinical Impression   Patient presenting with decreased independence in self care, balance, functional mobility/transfers, and endurance. PTA pt lived with family, was independent for ADLs/IADLs, and independent for functional mobility without an AD. Pt currently functioning at [place] guard for bed mobility, Min A to stand from EOB, and Min guard to take several steps toward recliner using RW. He required Max A for LB dressing and set up-supervision for seated grooming tasks. Pt on 2L O2 via [place] t/o session, VSS. Pt will benefit from acute OT to increase overall independence in the areas of ADLs and functional mobility in order to safely discharge to next venue of care. Upon hospital discharge, recommend STR to maximize pt safety and return to PLOF.    Recommendations for follow up therapy are one component of a multi-disciplinary discharge planning process, led by the attending physician.  Recommendations may be updated based on patient status, additional functional criteria and insurance authorization.   Follow Up Recommendations  Skilled nursing-short term rehab (<3 hours/day)     Assistance Recommended at Discharge Frequent or constant Supervision/Assistance  Patient can return home with the following A lot of help with bathing/dressing/bathroom;A lot of help with walking and/or transfers;Assistance with cooking/housework;Assist for  transportation;Help with stairs or ramp for entrance    Functional Status Assessment  Patient has had a recent decline in their functional status and demonstrates the ability to make significant improvements in function in a reasonable and predictable amount of time.  Equipment Recommendations  Other (comment) (defer to next venue of care)    Recommendations for Other Services       Precautions / Restrictions Precautions Precautions: Fall Restrictions Weight Bearing Restrictions: No Other Position/Activity Restrictions: L chest tube      Mobility Bed Mobility Overal bed mobility: Needs Assistance Bed Mobility: Supine to Sit     Supine to sit: Min guard     General bed mobility comments: increased time/effort, use of bed rails    Transfers Overall transfer level: Needs assistance Equipment used: Rolling walker (2 wheels) Transfers: Sit to/from Stand Sit to Stand: Min assist, From elevated surface (from EOB)           General transfer comment: Mod verbal cues for increased trunk flexion and hand placement with min A and multiple rocking attempts to come to standing      Balance Overall balance assessment: Needs assistance Sitting-balance support: Feet supported Sitting balance-Leahy Scale: Good     Standing balance support: Bilateral upper extremity supported, During functional activity, Reliant on assistive device for balance Standing balance-Leahy Scale: Fair                             ADL either performed or assessed with clinical judgement   ADL Overall ADL's : Needs assistance/impaired     Grooming: Set up;Sitting Grooming Details (indicate cue type and reason): anticipate Min guard in standing  Upper Body Dressing : Minimal assistance;Sitting Upper Body Dressing Details (indicate cue type and reason): anticipate Lower Body Dressing: Maximal assistance;Sitting/lateral leans Lower Body Dressing Details (indicate cue type and  reason): socks (normally wears slip on shoes) Toilet Transfer: Minimal assistance;Rolling walker (2 wheels) Toilet Transfer Details (indicate cue type and reason): simulated Toileting- Clothing Manipulation and Hygiene: Maximal assistance;Sit to/from stand Toileting - Clothing Manipulation Details (indicate cue type and reason): anticipate     Functional mobility during ADLs: Min guard;Rolling walker (2 wheels) (to take ~4 side steps toward recliner, VC for RW management & reaching back for recliner)       Vision Baseline Vision/History: 1 Wears glasses (readers) Patient Visual Report: No change from baseline       Perception     Praxis      Pertinent Vitals/Pain Pain Assessment Pain Assessment: No/denies pain     Hand Dominance Right   Extremity/Trunk Assessment Upper Extremity Assessment Upper Extremity Assessment: Generalized weakness   Lower Extremity Assessment Lower Extremity Assessment: Generalized weakness       Communication Communication Communication: No difficulties   Cognition Arousal/Alertness: Awake/alert Behavior During Therapy: WFL for tasks assessed/performed Overall Cognitive Status: Within Functional Limits for tasks assessed                                       General Comments       Exercises Other Exercises Other Exercises: OT provided education re: role of OT, OT POC, post acute recs, sitting up for all meals, EOB/OOB mobility with assistance, home/fall safety.    Shoulder Instructions      Home Living Family/patient expects to be discharged to:: Private residence Living Arrangements: Children Available Help at Discharge: Family;Available 24 hours/day;Other (Comment) (Lives with son and granddaughter who both work, brother-in-law and another son live close, can have 24/7 supervision as needed) Type of Home: House Home Access: Stairs to enter Technical brewer of Steps: 5 Entrance Stairs-Rails: Right;Left;Can  reach both [place]: One level     Bathroom Shower/Tub: Occupational psychologist: [place]: Grab bars - toilet;Grab bars - tub/shower;Tub bench;Other (comment) (walking stick)          Prior Functioning/Environment Prior Level of Function : Independent/Modified Independent;Driving             Mobility Comments: Ind amb community distances without an AD, no fall history, participates with cardiac re[facility] at [facility] ADLs Comments: Ind with ADLs/IADLs, does own grocery shopping, cooking, cleaning room        OT Problem List: Decreased strength;Decreased activity tolerance;Impaired balance (sitting and/or standing);Obesity;Cardiopulmonary status limiting activity;Decreased knowledge of use of DME or AE      OT Treatment/Interventions: Self-care/ADL training;Therapeutic exercise;Therapeutic activities;Energy conservation;DME and/or AE instruction;Patient/family education;Balance training    OT Goals(Current goals can be found in the care plan section) Acute Rehab OT Goals Patient Stated Goal: get stronger, walk further OT Goal Formulation: With patient Time For Goal Achievement: 04/29/22 Potential to Achieve Goals: Good   OT Frequency: Min 2X/week    Co-evaluation              AM-PAC OT "6 Clicks" Daily Activity     Outcome Measure Help from another person eating meals?: None Help from another person taking care of personal grooming?: A Little Help from another person toileting, which includes using toliet, bedpan, or urinal?: A Lot  Help from another person bathing (including washing, rinsing, drying)?: A Lot Help from another person to put on and taking off regular upper body clothing?: A Little Help from another person to put on and taking off regular lower body clothing?: A Lot 6 Click Score: 16   End of Session Equipment Utilized During Treatment: Rolling walker (2 wheels);Gait belt;Oxygen Nurse Communication: Mobility  status  Activity Tolerance: Patient tolerated treatment well;Patient limited by fatigue Patient left: in chair;with call bell/phone within reach;with chair alarm set  OT Visit Diagnosis: Other abnormalities of gait and mobility (R26.89);Muscle weakness (generalized) (M62.81)                Time: 1157-2620 OT Time Calculation (min): 30 min Charges:  OT General Charges $OT Visit: 1 Visit OT Evaluation $OT Eval Moderate Complexity: 1 Mod  [facility] MS, OTR/L ascom [phone]  04/15/22, 1:06 PM

## 2022-04-15 NOTE — Evaluation (Signed)
Physical Therapy Evaluation Patient Details Name: [name] MRN: [id number] DOB: 11-16-1950 Today's Date: 04/15/2022  History of Present Illness  Pt is a 72 y.o. male with medical history significant of arthritis, CHF, CAD, DM2, HLD, HTN, treated melanoma, and spinal stenosis who presents for worsening cough, fatigue and SOB. MD assessment includes: Influenza A, hypoxic respiratory failure, hemothorax s/p L chest tube placement, AKI, acute on chronic anemia, hyperglycemia, type 2 NSTEMI related to hemothorax, blood loss, and tachycardia, and edema of the LE.   Clinical Impression  Pt was pleasant and motivated to participate during the session and put forth good effort throughout. Pt required significant time and effort along with multiple rocking attempts and min A to come to standing from recliner.  Pt benefited from cuing for increased trunk flexion and hand placement during transfer training.  Pt able to stand multiple times during the session but was only able to take several very small, shuffling steps before needing to return to sitting.  Pt did c/o some dizziness in standing with seated BP taken at 139/63, HR 84, and standing BP 140/61, HR 90.  Pt will benefit from PT services in a SNF setting upon discharge to safely address deficits listed in patient problem list for decreased caregiver assistance and eventual return to PLOF.         Recommendations for follow up therapy are one component of a multi-disciplinary discharge planning process, led by the attending physician.  Recommendations may be updated based on patient status, additional functional criteria and insurance authorization.  Follow Up Recommendations Skilled nursing-short term rehab (<3 hours/day) Can patient physically be transported by private vehicle: No    Assistance Recommended at Discharge Frequent or constant Supervision/Assistance  Patient can return home with the following  A lot of help with walking and/or  transfers;A lot of help with bathing/dressing/bathroom;Assistance with cooking/housework;Assist for transportation;Help with stairs or ramp for entrance    Equipment Recommendations Other (comment) (TBD at next venue of care)  Recommendations for Other Services       Functional Status Assessment Patient has had a recent decline in their functional status and demonstrates the ability to make significant improvements in function in a reasonable and predictable amount of time.     Precautions / Restrictions Precautions Precautions: Fall Restrictions Weight Bearing Restrictions: No Other Position/Activity Restrictions: L chest tube      Mobility  Bed Mobility               General bed mobility comments: NT, pt in recliner    Transfers Overall transfer level: Needs assistance Equipment used: Rolling walker (2 wheels) Transfers: Sit to/from Stand Sit to Stand: Min assist           General transfer comment: Mod verbal cues for increased trunk flexion and hand placement with min A and multiple rocking attempts to come to standing    Ambulation/Gait Ambulation/Gait assistance: Min guard Gait Distance (Feet): 2 Feet x 2 Assistive device: Rolling walker (2 wheels) Gait Pattern/deviations: Step-to pattern, Step-through pattern, Shuffle, Trunk flexed Gait velocity: decreased     General Gait Details: Pt only able to take several small, effortful, shuffling steps near the chair with close CGA  Stairs            Wheelchair Mobility    Modified Rankin (Stroke Patients Only)       Balance     Sitting balance-Leahy Scale: Good     Standing balance support: Bilateral upper extremity supported, During functional  activity, Reliant on assistive device for balance Standing balance-Leahy Scale: Fair                               Pertinent Vitals/Pain Pain Assessment Pain Assessment: No/denies pain    Home Living Family/patient expects to be  discharged to:: Private residence Living Arrangements: Children Available Help at Discharge: Family;Available 24 hours/day;Other (Comment) (Lives with son and granddaughter who both work, brother-in-law and another son live close, can have 24/7 supervision as needed) Type of Home: House Home Access: Stairs to enter Entrance Stairs-Rails: Right;Left;Can reach both Technical brewer of Steps: [place]: One level [place]: Shower seat;Grab bars - toilet;Grab bars - tub/shower      Prior Function Prior Level of Function : Independent/Modified Independent             Mobility Comments: Ind amb community distances without an AD, no fall history, participates with cardiac rehab at [facility] ADLs Comments: Ind with ADLs     Hand Dominance   Dominant Hand: Right    Extremity/Trunk Assessment   Upper Extremity Assessment Upper Extremity Assessment: Generalized weakness    Lower Extremity Assessment Lower Extremity Assessment: Generalized weakness       Communication   Communication: No difficulties  Cognition Arousal/Alertness: Awake/alert Behavior During Therapy: WFL for tasks assessed/performed Overall Cognitive Status: Within Functional Limits for tasks assessed                                          General Comments      Exercises Total Joint Exercises Long Arc Quad: AROM, Strengthening, Both, 10 reps Knee Flexion: AROM, Strengthening, Both, 10 reps Other Exercises Other Exercises: Static standing 3 x 2-3 min for improved activity tolerance   Assessment/Plan    PT Assessment Patient needs continued PT services  PT Problem List Decreased strength;Decreased activity tolerance;Decreased balance;Decreased mobility;Decreased knowledge of use of DME       PT Treatment Interventions DME instruction;Gait training;Stair training;Functional mobility training;Therapeutic activities;Therapeutic exercise;Balance training;Patient/family  education    PT Goals (Current goals can be found in the Care Plan section)  Acute Rehab PT Goals Patient Stated Goal: To get stronger PT Goal Formulation: With patient Time For Goal Achievement: 04/28/22 Potential to Achieve Goals: Good    Frequency Min 2X/week     Co-evaluation               AM-PAC PT "6 Clicks" Mobility  Outcome Measure Help needed turning from your back to your side while in a flat bed without using bedrails?: A Little Help needed moving from lying on your back to sitting on the side of a flat bed without using bedrails?: A Little Help needed moving to and from a bed to a chair (including a wheelchair)?: A Little Help needed standing up from a chair using your arms (e.g., wheelchair or bedside chair)?: A Little Help needed to walk in hospital room?: A Lot Help needed climbing 3-5 steps with a railing? : Total 6 Click Score: 15    End of Session Equipment Utilized During Treatment: Gait belt Activity Tolerance: Patient tolerated treatment well Patient left: in chair;with call bell/phone within reach;with chair alarm set;with nursing/sitter in room Nurse Communication: Mobility status PT Visit Diagnosis: Unsteadiness on feet (R26.81);Muscle weakness (generalized) (M62.81);Difficulty in walking, not elsewhere classified (R26.2)  Time: 5830-7460 PT Time Calculation (min) (ACUTE ONLY): 40 min   Charges:   PT Evaluation $PT Eval Moderate Complexity: 1 Mod PT Treatments $Therapeutic Exercise: 8-22 mins       D. Royetta Asal PT, DPT 04/15/22, 12:35 PM

## 2022-04-15 NOTE — Progress Notes (Signed)
[facility] Progress Note  Patient: [name]    VZC:[id number]  DOA: 04/11/2022     Date of Service: the patient was seen and examined on 04/15/2022  Chief Complaint  Patient presents with   Shortness of Breath   Brief hospital course: [name] is a 72 y.o. male with medical history significant of arthritis, CHF (chronic), CAD, DM2, HLD, HTN, h/o treated melanoma, spinal stenosis who presents for worsening cough, fatigue and sob. Had low-grade fever, he was found to have positive for influenza A. He had recurrent left-sided pleural effusion, had a thoracentesis x 2 during the last months.  Upon arrival emergency room, chest x-ray showed large left-sided pleural effusion again, chest tube was placed by ED physician. Patient also developed significant hypoxemia with oxygen saturation as low as 76% on room air this morning.  Treated with oxygen. Patient has severe volume overload with evidence of bichamber heart failure.  Echocardiogram showed EF 45-50% with grade 2 diastolic dysfunction. Pulm valve not visualized.  Furosemide drip started after seen by nephrology.   1/29.  Patient had minimal urine after Lasix, but chest tube had drained additional 1500 mL in 24 hours.    Assessment and Plan: Acute on chronic combined systolic and diastolic congestive heart failure Acute hypoxemic respiratory failure secondary to volume overload. Elevated troponin with NSTEMI. Coronary artery disease. Patient has significant hypoxemia, oxygen dropped down to 76% when oxygen is off.  Patient has significant short of breath with minimal exertion. Patient also has significant volume overload.  He has evidence of bichamber heart failure.  He has severe bilateral leg edema.  Also has significant elevation of BNP. Discussed with nephrology and cardiology,started lasix drip Abdominal ultrasound did not show any ascites. Patient has been making urine, urine output was not accurate due to acute  incontinent of urine.  Continue to follow with Lasix drip. Patient also has significant drainage from the left-sided chest tube, which may have made the volume status better. Continue oxygen, oxygenation seems to be better.      Influenza A. Continue Tamiflu for 5 days till 04/16/22  Recurrent left side pleural effusion.  Hemothorax ruled out. Chest tube was placed in the emergency room, removed 2 L of serous fluids.  No evidence of bleeding.  Based on prior lab test, cytology was negative.  Prior test also does not indicate infection.  Reviewed chest x-ray post chest tube, no evidence of pneumonia. Procalcitonin level not significant elevated at 0.2 with chronic renal function.  No evidence of infection.  Patient is followed by pulmonology, patient appears to have rapid accumulation of pleural fluid.  Had a discussion with CT surgery, pulmonology and nephrology.  Patient still has a large amount of the drain from the chest tube, patient cannot be discharged with chest tube in place.  The source of increased pleural effusion is due to heart failure, third spacing from renal disease.  Patient will be given albumin, diuretics.  If this does not improve, we may have to use Pleurx catheter.   Acute kidney injury on chronic kidney disease stage II. Idiopathic membranous glomerulonephritis. Hyponatremia. Patient has a worsening renal function on chronic renal disease.  He is grossly volume overloaded, he is started on Lasix drip.  Renal function is gradually getting worse.  There is a possibility that patient may need a dialysis. Nephrology is working with oncology to start rituximab.    Iron deficient anemia. Acute blood loss anemia ruled out. Patient received IV iron, hemoglobin  dropped down to 6.9, chest tube fluids does not seem to be bloody.  Will give 1 unit PRBC.  No evidence of GI bleed.   Uncontrolled type 2 diabetes with hyperglycemia Continue long-acting insulin, sliding scale insulin.    Essential hypertension Continue to monitor.   Morbid obesity BMI 41.47, Likely obstructive sleep apnea. Diet exercise. Will decide if patient need BiPAP before discharge.   Body mass index is 41.47 kg/m.  Interventions:       Diet: Carb modified diet DVT Prophylaxis: Subcutaneous Lovenox   Advance goals of care discussion: Full code  Family Communication: family was not present at bedside, at the time of interview.  The pt provided permission to discuss medical plan with the family. Opportunity was given to ask question and all questions were answered satisfactorily.   Disposition:  Pt is from Home, admitted with anasarca and left pleural effusion, still has left-sided chest tube and on IV Lasix infusion, which precludes a safe discharge. Discharge to home, when clinically stable, needs pulmonary clearance.  Subjective: No significant events overnight, patient denies any worsening of shortness of breath, no chest pain reported.  Still has significant edema in the bilateral lower extremities, chest tube is intact.  No any complaints.  Physical Exam: General: NAD, lying comfortably Appear in no distress, affect appropriate Eyes: PERRLA ENT: Oral Mucosa Clear, moist  Neck: no JVD,  Cardiovascular: S1 and S2 Present, no Murmur,  Respiratory: good respiratory effort, Bilateral Air entry equal and Decreased, bilateral crackles, decreased breath sounds in the left lower chest, no wheezing, left chest tube intact Abdomen: Bowel Sound present, Soft and no tenderness,  Skin: no rashes Extremities: 4+ Pedal edema, no calf tenderness Neurologic: without any new focal findings Gait not checked due to patient safety concerns  Vitals:   04/15/22 0349 04/15/22 0737 04/15/22 1153 04/15/22 1619  BP: 120/61 138/70 131/60 (!) 126/58  Pulse: 87 88 80 76  Resp: '18 18 18 18  '$ Temp: 97.9 F (36.6 C) 98.1 F (36.7 C) 98.3 F (36.8 C) 97.9 F (36.6 C)  TempSrc:      SpO2: 91% 97% 100%  100%  Weight:      Height:        Intake/Output Summary (Last 24 hours) at 04/15/2022 1650 Last data filed at 04/15/2022 1414 Gross per 24 hour  Intake 1686 ml  Output 1450 ml  Net 236 ml   Filed Weights   04/11/22 2039  Weight: (!) 138.7 kg    Data Reviewed: I have personally reviewed and interpreted daily labs, tele strips, imagings as discussed above. I reviewed all nursing notes, pharmacy notes, vitals, pertinent old records I have discussed plan of care as described above with RN and patient/family.  CBC: Recent Labs  Lab 04/11/22 2033 04/12/22 0500 04/13/22 0520 04/14/22 0506 04/15/22 0235  WBC 5.3 5.5 4.0 3.9* 5.1  NEUTROABS 4.5  --   --   --   --   HGB 8.1* 8.0* 7.3* 6.9* 7.7*  HCT 25.8* 26.0* 23.5* 21.4* 24.3*  MCV 89.9 92.5 90.7 88.4 88.4  PLT 193 189 195 180 737   Basic Metabolic Panel: Recent Labs  Lab 04/11/22 2033 04/12/22 0500 04/13/22 0520 04/14/22 0506 04/15/22 0235  NA 134* 133* 132* 132* 135  K 4.2 4.1 4.0 3.8 3.8  CL 97* 98 97* 100 99  CO2 '22 24 24 26 29  '$ GLUCOSE 244* 224* 253* 141* 148*  BUN 63* 69* 76* 82* 83*  CREATININE 1.69* 1.92* 2.03*  2.16* 2.26*  CALCIUM 8.0* 7.9* 7.6* 7.4* 7.6*  MG  --   --  2.2 2.4 2.4    Studies: DG Chest Port 1 View  Result Date: 04/15/2022 CLINICAL DATA:  Left pleural effusion. EXAM: PORTABLE CHEST 1 VIEW COMPARISON:  April 14, 2022. FINDINGS: Stable cardiomegaly. Stable left basilar atelectasis or infiltrate is noted with associated pleural effusion. Minimal right basilar subsegmental atelectasis is noted. Bony thorax is unremarkable. IMPRESSION: Stable left basilar atelectasis or infiltrate is noted with associated pleural effusion. Minimal right basilar subsegmental atelectasis. Electronically Signed   By: Marijo Conception M.D.   On: 04/15/2022 14:49    Scheduled Meds:  sodium chloride   Intravenous Once   vitamin C  500 mg Oral Daily   atorvastatin  80 mg Oral Daily   enoxaparin (LOVENOX) injection   0.5 mg/kg Subcutaneous Q24H   insulin aspart  0-15 Units Subcutaneous TID WC   insulin aspart  0-5 Units Subcutaneous QHS   insulin glargine-yfgn  25 Units Subcutaneous QHS   iron polysaccharides  150 mg Oral Daily   midodrine  5 mg Oral TID WC   oseltamivir  30 mg Oral Daily   sodium chloride flush  3 mL Intravenous Q12H   Continuous Infusions:  albumin human 25 g (04/15/22 1307)   furosemide (LASIX) 200 mg in dextrose 5 % 100 mL (2 mg/mL) infusion 4 mg/hr (04/14/22 0507)   methocarbamol (ROBAXIN) IV     PRN Meds: acetaminophen **OR** acetaminophen, guaiFENesin-dextromethorphan, HYDROmorphone (DILAUDID) injection, methocarbamol (ROBAXIN) IV, ondansetron **OR** ondansetron (ZOFRAN) IV, oxyCODONE  Time spent: 35 minutes  Author: Val Riles. MD [facility] 04/15/2022 4:50 PM  To reach On-call, see care teams to locate the attending and reach out to them via [email or web address]. If 7PM-7AM, please contact night-coverage If you still have difficulty reaching the attending provider, please page the [facility] (Director on Call) for [facility] on amion for assistance.

## 2022-04-15 NOTE — Progress Notes (Signed)
[place] NOTE       Patient ID: [name] MRN: [id number] DOB/AGE: May 13, 1950 72 y.o.  Admit date: 04/11/2022 Referring Physician Dr. Roosevelt Locks Primary Physician Dr. Lovie Macadamia Primary Cardiologist Dr. [name], Leroy Libman, PA-C  Reason for Consultation AoCHF  HPI: [name] is a 89yoM with a PMH of HFmrEF (45-50%, global hypo, mod LVH, g2dd 03/2022), CAD s/p PCI mid LAD (2001), HLD, DM2, IDA, melanoma s/p excision (2018), idiopathic membranous glomerulonephritis w/ nephrotic syndrome, recurrent bilateral pleural effusions who presented to [facility] ED 04/11/22 with confusion, hemoptysis and increased shortness of breath x 1 day. Positive for influenza A. Febrile and tachycardic on admission, CTA chest with large left effusion s/p pigtail chest tube in the ED on 1/28. Hospital course complicated by [facility], AKI following diuresis, and anemia requiring 1 unit pRBCs & IV iron. Remains on supplemental O2 and a lasix infusion, chest tube with significant output.   Interval History:  - felt nauseous this AM. Seen while working with PT, attempting to stand to obtain orthostatic vitals. - good UOP despite worsening Cr. Chest tube still draining serosanguinous fluid - no chest pain, some discomfort at L chest tube insertion point. Does not feel short of breath. Peripheral edema improving somewhat  Review of systems complete and found to be negative unless listed above     Past Medical History:  Diagnosis Date   Arthritis    CHF (congestive heart failure), NYHA class II, acute on chronic, systolic (HCC)    EF 02% 06/971   Coronary artery disease    [facility]   Diabetes mellitus without complication ([place])    Dysplastic nevus 08/26/2020   L flank, moderate atypia   Hyperlipidemia    Hypertension    Melanoma ([place]) 08/20/2015   Right neck. Superficial spreading, arising in nevus. Tumor thickness 1.37m, Anatomic level IV   [place]   R lateral neck ant to scar, exc 05/22/20   Numbness in right leg    due to back   Skin cancer    removed l arm   Spinal stenosis    Stented coronary artery 2000   X 1 STENT   Swelling of both lower extremities     Past Surgical History:  Procedure Laterality Date   CATARACT EXTRACTION W/PHACO Right 05/03/2019   Procedure: CATARACT EXTRACTION PHACO AND INTRAOCULAR LENS PLACEMENT (IOC) RIGHT DIABETIC 6.44 00:58.4 11.0%;  Surgeon: BLeandrew Koyanagi MD;  [place]  Service: Ophthalmology;  Laterality: Right;  DIABETIC   CATARACT EXTRACTION W/PHACO Left 06/28/2019   Procedure: CATARACT EXTRACTION PHACO AND INTRAOCULAR LENS PLACEMENT (IOC) LEFT DIABETIC 7.99  01:13.4  10.9% ;  Surgeon: BLeandrew Koyanagi MD;  [place]  Service: Ophthalmology;  Laterality: Left;  Diabetic - insulin and oral meds   CHOLECYSTECTOMY     JOINT REPLACEMENT     RT KNEE   LUMBAR LAMINECTOMY/DECOMPRESSION MICRODISCECTOMY  04/13/2012   Procedure: LUMBAR LAMINECTOMY/DECOMPRESSION MICRODISCECTOMY 1 LEVEL;  Surgeon: JJohnn Hai MD;  Location[facility];  Service: Orthopedics;  Laterality: Bilateral;  L4-L5   UVULECTOMY  1990's    Medications Prior to Admission  Medication Sig Dispense Refill Last Dose   aspirin EC 81 MG tablet Take 162 mg by mouth daily. Swallow whole.   04/10/2022   atorvastatin (LIPITOR) 80 MG tablet Take 80 mg by mouth daily.   04/11/2022   Continuous Blood Gluc Receiver (DEXCOM G6 RECEIVER) DEVI Use to monitor blood  sugar. NDC [address]-62   04/11/2022   Continuous Blood Gluc Sensor (DEXCOM G6 SENSOR) MISC Use to monitor blood sugar.  Replace every 10 days. [place] [address]-18.   04/11/2022   Continuous Blood Gluc Transmit (DEXCOM G6 TRANSMITTER) MISC Use to monitor blood sugar.  Replace every 3 months. [place] [address]-78.   04/11/2022   insulin glargine (LANTUS) 100 UNIT/ML injection Inject 34 Units into the skin daily.   04/10/2022    metFORMIN (GLUCOPHAGE) 500 MG tablet Take 1,000 mg by mouth daily with supper.      torsemide (DEMADEX) 100 MG tablet Take 100 mg by mouth daily.   04/10/2022   amLODipine (NORVASC) 5 MG tablet Take 5 mg by mouth daily. (Patient not taking: Reported on 04/12/2022)   Not Taking   FEROSUL 325 (65 Fe) MG tablet Take 1 tablet (325 mg total) by mouth 2 (two) times daily. (Patient not taking: Reported on 04/12/2022) 180 tablet 1 Not Taking   ferrous sulfate (FEROSUL) 325 (65 FE) MG tablet Take by mouth.   04/10/2022   furosemide (LASIX) 20 MG tablet Take by mouth. (Patient not taking: Reported on 04/12/2022)   Not Taking   furosemide (LASIX) 80 MG tablet Take by mouth. (Patient not taking: Reported on 04/12/2022)   Not Taking   glipiZIDE (GLUCOTROL XL) 5 MG 24 hr tablet       insulin aspart (NOVOLOG) 100 UNIT/ML injection Inject into the skin 3 (three) times daily before meals.      Insulin Pen Needle (COMFORT EZ PEN NEEDLES) 32G X 8 MM MISC See admin instructions.      isosorbide mononitrate (IMDUR) 30 MG 24 hr tablet Take by mouth.      JARDIANCE 10 MG TABS tablet       LANTUS SOLOSTAR 100 UNIT/ML Solostar Pen SMARTSIG:44 Unit(s) SUB-Q Every Night      losartan (COZAAR) 100 MG tablet  (Patient not taking: Reported on 04/12/2022)   Not Taking   NOVOLOG FLEXPEN 100 UNIT/ML FlexPen INJ UP TO 50 UNI D [place] IN MULTIPLE INJECTIONS B MEALS UTD      spironolactone (ALDACTONE) 25 MG tablet Take 50 mg by mouth daily.   04/10/2022   urea (CARMOL) 10 % cream Apply topically. (Patient not taking: Reported on 02/19/2022)      valsartan (DIOVAN) 320 MG tablet Take 320 mg by mouth daily.   04/10/2022   Social History   Socioeconomic History   Marital status: Widowed    Spouse name: Not on file   Number of children: Not on file   Years of education: Not on file   Highest education level: Not on file  Occupational History   Not on file  Tobacco Use   Smoking status: Never   Smokeless tobacco: Never  Vaping Use    Vaping Use: Never used  Substance and Sexual Activity   Alcohol use: Yes    Comment: rare   Drug use: No   Sexual activity: Not on file  Other Topics Concern   Not on file  Social History Narrative   Not on file   Social Determinants of Health   Financial Resource Strain: Not on file  Food Insecurity: No Food Insecurity (04/13/2022)   Hunger Vital Sign    [name]: Never true    [name]: Never true  Transportation Needs: No Transportation Needs (04/13/2022)   PRAPARE - Transportation    Lack of  Transportation (Medical): No    Lack of Transportation (Non-Medical): No  Physical Activity: Not on file  Stress: Not on file  Social Connections: Not on file  Intimate Partner Violence: Not At Risk (04/13/2022)   Humiliation, Afraid, Rape, and Kick questionnaire    Fear of Current or Ex-Partner: No    Emotionally Abused: No    Physically Abused: No    Sexually Abused: No    History reviewed. No pertinent family history.    Intake/Output Summary (Last 24 hours) at 04/15/2022 1041 Last data filed at 04/15/2022 0957 Gross per 24 hour  Intake 1326 ml  Output 1450 ml  Net -124 ml    Vitals:   04/14/22 2139 04/14/22 2337 04/15/22 0349 04/15/22 0737  BP: 131/67 129/60 120/61 138/70  Pulse: 78 73 87 88  Resp: '20 14 18 18  '$ Temp: 98.1 F (36.7 C) (!) 97.4 F (36.3 C) 97.9 F (36.6 C) 98.1 F (36.7 C)  TempSrc:      SpO2: 97% 100% 91% 97%  Weight:      Height:        PHYSICAL EXAM General: ill appearing caucasian male, in no acute distress. Sitting in recliner with PT at bedside. HEENT:  Normocephalic and atraumatic. Neck:  No JVD.  Lungs: Normal respiratory effort on O2 by [place]. Decreased breath sounds with bibasilar crackles Chest: L pigtail chest tube Heart: HRRR . Normal S1 and S2 without gallops or murmurs.  Abdomen: Non-distended appearing.  Msk: Normal strength and tone for age. Extremities:  No  clubbing, cyanosis. 1+ bilateral LE edema. ACE wraps up to knees.  Neuro: Alert and oriented X 3. Psych:  Answers questions appropriately.   Labs: Basic Metabolic Panel: Recent Labs    04/14/22 0506 04/15/22 0235  NA 132* 135  K 3.8 3.8  CL 100 99  CO2 26 29  GLUCOSE 141* 148*  BUN 82* 83*  CREATININE 2.16* 2.26*  CALCIUM 7.4* 7.6*  MG 2.4 2.4   Liver Function Tests: Recent Labs    04/15/22 0235  AST 20  ALT 11  ALKPHOS 60  BILITOT 1.2  PROT 5.6*  ALBUMIN 2.9*   No results for input(s): "LIPASE", "AMYLASE" in the last 72 hours. CBC: Recent Labs    04/14/22 0506 04/15/22 0235  WBC 3.9* 5.1  HGB 6.9* 7.7*  HCT 21.4* 24.3*  MCV 88.4 88.4  PLT 180 204   Cardiac Enzymes: No results for input(s): "CKTOTAL", "CKMB", "CKMBINDEX", "TROPONINIHS" in the last 72 hours. BNP: No results for input(s): "BNP" in the last 72 hours. D-Dimer: No results for input(s): "DDIMER" in the last 72 hours. Hemoglobin A1C: No results for input(s): "HGBA1C" in the last 72 hours. Fasting Lipid Panel: No results for input(s): "CHOL", "HDL", "LDLCALC", "TRIG", "CHOLHDL", "LDLDIRECT" in the last 72 hours. Thyroid Function Tests: No results for input(s): "TSH", "T4TOTAL", "T3FREE", "THYROIDAB" in the last 72 hours.  Invalid input(s): "FREET3" Anemia Panel: No results for input(s): "VITAMINB12", "FOLATE", "FERRITIN", "TIBC", "IRON", "RETICCTPCT" in the last 72 hours.   Radiology: [facility] Chest Port 1 View  Result Date: 04/14/2022 CLINICAL DATA:  Left-sided pleural effusion EXAM: PORTABLE CHEST 1 VIEW COMPARISON:  04/12/2022 FINDINGS: Cardiomegaly. Similar appearance of layering left pleural effusion and associated atelectasis or consolidation. Left-sided chest tube remains in position about the left lung base. No pneumothorax. No new airspace opacity. Osseous structures unremarkable. The visualized skeletal structures are unremarkable. IMPRESSION: 1. Similar appearance of layering left pleural  effusion and associated atelectasis or consolidation. Left-sided  chest tube remains in position about the left lung base. No pneumothorax. No new airspace opacity. 2. Cardiomegaly. Electronically Signed   By: Delanna Ahmadi M.D.   On: 04/14/2022 12:58   ECHOCARDIOGRAM COMPLETE  Result Date: 04/12/2022    ECHOCARDIOGRAM REPORT   Patient Name:   [name] Date of Exam: 04/12/2022 Medical Rec #:  [id number]      Height:       72.0 in Accession #:    [id number]     Weight:       305.8 lb Date of Birth:  [birth year]      BSA:          2.552 m Patient Age:    82 years       BP:           136/99 mmHg Patient Gender: M              HR:           101 bpm. Exam Location:  [facility] Procedure: 2D Echo Indications:     CHF I50.21  History:         Patient has no prior history of Echocardiogram examinations.  Sonographer:     Kathlen Brunswick RDCS Referring Phys:  [id number] Sharen Hones Diagnosing Phys: Yolonda Kida MD  Sonographer Comments: Technically challenging study due to limited acoustic windows, patient is obese and suboptimal apical window. Image acquisition challenging due to patient body habitus. Difficult and challenging study due to left side chest tube and  drain at the time of this study. IMPRESSIONS  1. Left ventricular ejection fraction, by estimation, is 45 to 50%. The left ventricle has mildly decreased function. The left ventricle demonstrates global hypokinesis. The left ventricular internal cavity size was moderately to severely dilated. There  is moderate concentric left ventricular hypertrophy. Left ventricular diastolic parameters are consistent with Grade II diastolic dysfunction (pseudonormalization).  2. Right ventricular systolic function is mildly reduced. The right ventricular size is moderately enlarged. Mildly increased right ventricular wall thickness.  3. Left atrial size was mildly dilated.  4. Right atrial size was mildly dilated.  5. Moderate pleural effusion in the left lateral region.   6. The mitral valve was not well visualized. Trivial mitral valve regurgitation.  7. The aortic valve is grossly normal. Aortic valve regurgitation is not visualized. Aortic valve sclerosis is present, with no evidence of aortic valve stenosis. FINDINGS  Left Ventricle: Left ventricular ejection fraction, by estimation, is 45 to 50%. The left ventricle has mildly decreased function. The left ventricle demonstrates global hypokinesis. The left ventricular internal cavity size was moderately to severely dilated. There is moderate concentric left ventricular hypertrophy. Left ventricular diastolic parameters are consistent with Grade II diastolic dysfunction (pseudonormalization). Right Ventricle: The right ventricular size is moderately enlarged. Mildly increased right ventricular wall thickness. Right ventricular systolic function is mildly reduced. Left Atrium: Left atrial size was mildly dilated. Right Atrium: Right atrial size was mildly dilated. Pericardium: There is no evidence of pericardial effusion. Mitral Valve: The mitral valve was not well visualized. Trivial mitral valve regurgitation. Tricuspid Valve: The tricuspid valve is not well visualized. Tricuspid valve regurgitation is mild. Aortic Valve: The aortic valve is grossly normal. Aortic valve regurgitation is not visualized. Aortic valve sclerosis is present, with no evidence of aortic valve stenosis. Aortic valve peak gradient measures 3.0 mmHg. Pulmonic Valve: The pulmonic valve was normal in structure. Pulmonic valve regurgitation is not visualized. Aorta: The aortic arch was not  well visualized. IAS/Shunts: No atrial level shunt detected by color flow Doppler. Additional Comments: There is a moderate pleural effusion in the left lateral region.  LEFT VENTRICLE PLAX 2D LVIDd:         5.90 cm      Diastology LVIDs:         4.60 cm      LV e' medial:    8.49 cm/s LV PW:         1.50 cm      LV E/e' medial:  13.5 LV IVS:        1.60 cm      LV e'  lateral:   8.49 cm/s LVOT diam:     2.30 cm      LV E/e' lateral: 13.5 LV SV:         43 LV SV Index:   17 LVOT Area:     4.15 cm  LV Volumes (MOD) LV vol d, MOD A4C: 254.0 ml LV vol s, MOD A4C: 175.0 ml LV SV MOD A4C:     254.0 ml LEFT ATRIUM           Index LA diam:      4.20 cm 1.65 cm/m LA Vol (A4C): 43.0 ml 16.85 ml/m  AORTIC VALVE                 PULMONIC VALVE AV Area (Vmax): 2.99 cm     PV Vmax:       0.67 m/s AV Vmax:        86.60 cm/s   PV Peak grad:  1.8 mmHg AV Peak Grad:   3.0 mmHg LVOT Vmax:      62.30 cm/s LVOT Vmean:     41.800 cm/s LVOT VTI:       0.103 m  AORTA Ao Root diam: 3.70 cm Ao Asc diam:  2.90 cm MITRAL VALVE MV Area (PHT): 6.90 cm     SHUNTS MV Decel Time: 110 msec     Systemic VTI:  0.10 m MV E velocity: 115.00 cm/s  Systemic Diam: 2.30 cm MV A velocity: 82.30 cm/s MV E/A ratio:  1.40 Yolonda Kida MD Electronically signed by Yolonda Kida MD Signature Date/Time: 04/12/2022/4:14:41 PM    Final    US Abdomen Complete  Result Date: 04/12/2022 CLINICAL DATA:  Cirrhosis with ascites. EXAM: ABDOMEN ULTRASOUND COMPLETE COMPARISON:  Right upper quadrant ultrasound on 04/08/2022 FINDINGS: Gallbladder: Surgically absent. Common bile duct: Diameter: 7 mm, within normal limits post cholecystectomy. Liver: Suboptimal visualization due to patient habitus and limited acoustic windows. No focal liver lesion identified. Within normal limits in parenchymal echogenicity. Portal vein is patent on color Doppler imaging with normal direction of blood flow towards the liver. IVC: No abnormality visualized. Pancreas: Not visualized due to patient habitus and bowel gas. Spleen: Could not be visualized due to bandages from left chest tube. Right Kidney: Length: 11.9 cm. Echogenicity within normal limits. No mass or hydronephrosis visualized. Left Kidney: Length: 11.8 cm. Echogenicity within normal limits. No mass or hydronephrosis visualized. Abdominal aorta: Not visualized due to overlying bowel  gas. Other findings: No evidence of ascites. IMPRESSION: Technically limited exam due to patient habitus and bandages from chest tube. Prior cholecystectomy.  No evidence of biliary ductal dilatation. No definite hepatic abnormality identified.  No evidence of ascites. Electronically Signed   By: Marlaine Hind M.D.   On: 04/12/2022 12:44   DG Chest Port 1 View  Result Date: 04/12/2022 CLINICAL DATA:  Chest tube in place EXAM: PORTABLE CHEST 1 VIEW COMPARISON:  Chest x-ray April 12, 2022 FINDINGS: A left chest tube is in stable position. Persistent effusion with underlying opacity, similar to mildly worsened in the interval. No pneumothorax. No other changes. IMPRESSION: A left chest tube remains in place. Persistent effusion with underlying opacity, similar to mildly worsened in the interval. No pneumothorax. Electronically Signed   By: Dorise Bullion III M.D.   On: 04/12/2022 08:19   DG Chest Portable 1 View  Result Date: 04/12/2022 CLINICAL DATA:  Post chest tube placement EXAM: PORTABLE CHEST 1 VIEW COMPARISON:  Radiograph and CT 04/11/2022 FINDINGS: Decreased now small left pleural effusion after left basilar chest tube placement. Decreased left mid and lower lung atelectasis/consolidation. The right lung is clear. No pneumothorax. Stable cardiomediastinal silhouette. Aortic atherosclerotic calcification. IMPRESSION: Decreased now small left pleural effusion after left basilar chest tube placement. Electronically Signed   By: Placido Sou M.D.   On: 04/12/2022 00:31   CT Angio Chest PE W and/or Wo Contrast  Result Date: 04/11/2022 CLINICAL DATA:  Confusion and coughing up blood EXAM: CT ANGIOGRAPHY CHEST WITH CONTRAST TECHNIQUE: Multidetector CT imaging of the chest was performed using the standard protocol during bolus administration of intravenous contrast. Multiplanar CT image reconstructions and MIPs were obtained to evaluate the vascular anatomy. RADIATION DOSE REDUCTION: This exam was  performed according to the departmental dose-optimization program which includes automated exposure control, adjustment of the mA and/or kV according to patient size and/or use of iterative reconstruction technique. CONTRAST:  47m OMNIPAQUE IOHEXOL 350 MG/ML SOLN COMPARISON:  CTA chest 10/15/2019 and radiographs earlier today FINDINGS: Cardiovascular: Satisfactory opacification of the pulmonary arteries to the segmental level. No evidence of pulmonary embolism. Hazy hyperdensity layering along the periphery of the left lower lobe. This is indeterminate on single phase exam and could be due to heterogenous perfusion in the setting of pneumonia however is suspicious for active contrast extravasation possibly related to recent thoracentesis and/or AVM as this appears rounded peripherally (circa series 6/image 255-268). No definite feeding vessel is seen. Cardiomegaly. No pericardial effusion. Coronary artery and aortic atherosclerotic calcification. Mediastinum/Nodes: Shotty mediastinal and hilar lymph nodes have increased since 10/15/2019. The largest is a precarinal node measuring 1.3 cm (4/47). These are favored reactive. Unremarkable esophagus. Lungs/Pleura: Large left pleural effusion some of which is hyperdense posteriorly suspicious for hemothorax. Hazy ground-glass opacities in the superior segment left lower lobe and low-attenuation consolidation in the inferior left lower lobe. These findings may be due to hemorrhage and/or pneumonia. Secretions/fluid within the left mainstem bronchus. The left lower lobe bronchi are not visualized. Secretions/fluid within the lingular bronchi. Atelectasis/consolidation in the lingula. Mild bronchial wall thickening and clustered centrilobular micro nodularity in the right lung. Part solid and subsolid nodule in the right upper lobe measuring 1.0 cm with the solid component measuring 3 mm (6/66). Upper Abdomen: Cholecystectomy.  No acute abnormality. Musculoskeletal: No  chest wall abnormality. No acute osseous findings. Review of the MIP images confirms the above findings. IMPRESSION: 1. Findings concerning for active hemorrhage in the left lower lobe possibly related to recent thoracentesis and/or AVM versus heterogenous perfusion in the setting of pneumonia. 2. Large left hemothorax. 3. Ground-glass and low-attenuation consolidation in the left lower lobe suspicious for pneumonia and/or alveolar hemorrhage. 4. Nonspecific secretions or blood within the left mainstem bronchus left lower lobe bronchi and lingula. 5. Part solid and subsolid nodule in the right upper lobe measuring 1.0 cm. Follow-up non-contrast CT recommended at  3-6 months to confirm persistence. If unchanged, and solid component remains <6 mm, annual CT is recommended until 5 years of stability has been established. If persistent these nodules should be considered highly suspicious if the solid component of the nodule is 6 mm or greater in size and enlarging. This recommendation follows the consensus statement: Guidelines for Management of Incidental Pulmonary Nodules Detected on CT Images: From the Fleischner Society 2017; Radiology 2017; 284:228-243. Critical Value/emergent results were called by telephone at the time of interpretation on 04/11/2022 at 10:34 pm to provider PHILLIP STAFFORD , who verbally acknowledged these results. Electronically Signed   By: Placido Sou M.D.   On: 04/11/2022 22:50   DG Chest Portable 1 View  Result Date: 04/11/2022 CLINICAL DATA:  Shortness of breath and decreased left breath sounds EXAM: PORTABLE CHEST 1 VIEW COMPARISON:  Radiograph 04/02/2022 FINDINGS: Similar to increased large left pleural effusion and associated atelectasis/consolidation. The right lung is clear. No pneumothorax. Stable partially obscured cardiomediastinal silhouette. Aortic atherosclerotic calcification. IMPRESSION: Slightly increased large left pleural effusion and associated  atelectasis/consolidation. Electronically Signed   By: Placido Sou M.D.   On: 04/11/2022 20:51   US Abdomen Limited RUQ (LIVER/GB)  Result Date: 04/08/2022 CLINICAL DATA:  Cirrhosis EXAM: ULTRASOUND ABDOMEN LIMITED RIGHT UPPER QUADRANT COMPARISON:  CT abdomen and pelvis April 07, 2011, renal ultrasound January 30, 2022 FINDINGS: Gallbladder: Surgically absent. Common bile duct: Diameter: 5.5 mm Liver: No focal lesion identified. Within normal limits in parenchymal echogenicity. Portal vein is patent on color Doppler imaging with normal direction of blood flow towards the liver. Other: None. IMPRESSION: No acute abnormality. Electronically Signed   By: Abelardo Diesel M.D.   On: 04/08/2022 09:12   DG Chest Port 1 View  Result Date: 04/02/2022 CLINICAL DATA:  status post thora EXAM: PORTABLE CHEST 1 VIEW COMPARISON:  Chest x-ray 03/24/2022. FINDINGS: Moderate to large left pleural effusion and overlying opacities. No visible pneumothorax. Partially obscured cardiomediastinal silhouette. No acute osseous abnormality. Polyarticular degenerative change. IMPRESSION: Moderate to large left pleural effusion and overlying opacities. No visible pneumothorax. Electronically Signed   By: Margaretha Sheffield M.D.   On: 04/02/2022 09:29   US THORACENTESIS ASP PLEURAL SPACE W/IMG GUIDE  Result Date: 04/02/2022 INDICATION: Shortness of breath with recurrent left pleural effusion. EXAM: ULTRASOUND GUIDED LEFT THORACENTESIS MEDICATIONS: Local Lidocaine 1% only. COMPLICATIONS: None immediate. PROCEDURE: An ultrasound guided thoracentesis was thoroughly discussed with the patient and questions answered. The benefits, risks, alternatives and complications were also discussed. The patient understands and wishes to proceed with the procedure. Written consent was obtained. Ultrasound was performed to localize and mark an adequate pocket of fluid in the left chest. The area was then prepped and draped in the normal sterile  fashion. 1% Lidocaine was used for local anesthesia. Under ultrasound guidance a 19 gauge, 10-cm, Yueh catheter was introduced. Thoracentesis was performed. The catheter was removed and a dressing applied. FINDINGS: A total of approximately 1.5 L of clear yellow colored fluid was removed. Samples were sent to the laboratory as requested by the clinical team. IMPRESSION: Successful ultrasound guided left thoracentesis yielding 1.5 L of pleural fluid. This exam was performed by Tsosie Billing PA-C, and was supervised and interpreted by Dr. Annamaria Boots. Electronically Signed   By: Jerilynn Mages.  Shick M.D.   On: 04/02/2022 09:09   DG Chest Port 1 View  Result Date: 03/24/2022 CLINICAL DATA:  Post thoracentesis EXAM: PORTABLE CHEST 1 VIEW COMPARISON:  Chest XR, earlier same day and 03/13/2022. CT abdomen  pelvis, 02/19/2022. FINDINGS: LEFT cardiac border is obscured. Aortic arch atherosclerosis. The RIGHT lung is well inflated. Interval decrease of prior large LEFT pleural effusion with small-to-moderate volume residual. LEFT basilar consolidative opacity, likely atelectasis. No pneumothorax. No interval osseous abnormality. IMPRESSION: 1. Post thoracentesis with volume residual LEFT pleural effusion. No pneumothorax. 2. Consolidative LEFT basilar opacity, likely atelectasis. Electronically Signed   By: Michaelle Birks M.D.   On: 03/24/2022 15:45   US THORACENTESIS ASP PLEURAL SPACE W/IMG GUIDE  Result Date: 03/24/2022 INDICATION: Patient with complaint of dyspnea x3 months. During pulmonary visit today, patient was found to have large left pleural effusion. Patient was referred to IR for diagnostic and therapeutic LEFT thoracentesis. EXAM: ULTRASOUND GUIDED DIAGNOSTIC AND THERAPEUTIC LEFT THORACENTESIS MEDICATIONS: 10 mL 1 % lidocaine COMPLICATIONS: None immediate. PROCEDURE: An ultrasound guided thoracentesis was thoroughly discussed with the patient and questions answered. The benefits, risks, alternatives and complications were  also discussed. The patient understands and wishes to proceed with the procedure. Written consent was obtained. Ultrasound was performed to localize and mark an adequate pocket of fluid in the LEFT chest. The area was then prepped and draped in the normal sterile fashion. 1% Lidocaine was used for local anesthesia. Under ultrasound guidance a 6 Fr Safe-T-Centesis catheter was introduced. Thoracentesis was performed. The catheter was removed and a dressing applied. FINDINGS: A total of approximately 1.6 L of clear, yellow fluid was removed. Samples were sent to the laboratory as requested by the clinical team. IMPRESSION: Successful diagnostic and therapeutic LEFT thoracentesis yielding 1.6 L of pleural fluid. Read by: Narda Rutherford, AGNP-BC Electronically Signed   By: Michaelle Birks M.D.   On: 03/24/2022 15:31    ECHO 03/2022 1. Left ventricular ejection fraction, by estimation, is 45 to 50%. The  left ventricle has mildly decreased function. The left ventricle  demonstrates global hypokinesis. The left ventricular internal cavity size  was moderately to severely dilated. There   is moderate concentric left ventricular hypertrophy. Left ventricular  diastolic parameters are consistent with Grade II diastolic dysfunction  (pseudonormalization).   2. Right ventricular systolic function is mildly reduced. The right  ventricular size is moderately enlarged. Mildly increased right  ventricular wall thickness.   3. Left atrial size was mildly dilated.   4. Right atrial size was mildly dilated.   5. Moderate pleural effusion in the left lateral region.   6. The mitral valve was not well visualized. Trivial mitral valve  regurgitation.   7. The aortic valve is grossly normal. Aortic valve regurgitation is not  visualized. Aortic valve sclerosis is present, with no evidence of aortic  valve stenosis.   Exercise myoview 04/2021 Impression  Normal treadmill EKG without evidence of ischemia or  arrhythmia Mild global LV systolic dysfunction with ejection fraction of 40% Mild fixed inferior myocardial fusion defect consistent with previous infarct scar but more likely diaphragmatic attenuation Clinical correlation advised  TELEMETRY reviewed by me (LT) 04/15/2022 : NSR 70s-80s RBBB  EKG reviewed by me: Sinus tach, RBBB, nonspec ST changes  Data reviewed by me (LT) 04/15/2022: hospitalist, pulm, nephro progress notes, cbc, bmp, trops, bnp, vitals, tele    Principal Problem:   Influenza A Active Problems:   Iron deficiency anemia   Diabetes mellitus type 2 in obese (HCC)   CAD (coronary artery disease)   Essential (primary) hypertension   Hyperlipidemia, mixed   Obesity, Class III, BMI 40-49.9 (morbid obesity) ([place])   Chronic kidney disease due to type 2 diabetes mellitus ([place])  Edema of lower extremity   Acute on chronic systolic CHF (congestive heart failure) (HCC)   Acute renal failure superimposed on stage 2 chronic kidney disease (HCC)   Recurrent left pleural effusion   Acute hypoxemic respiratory failure (HCC)   Chronic membranous glomerulonephritis    ASSESSMENT AND PLAN:  Andrew Franklin "[name]" Summons is a 86yoM with a PMH of 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 Positive for influenza A. Febrile and tachycardic on admission, CTA chest with large left effusion s/p pigtail chest tube in the ED on 1/28. Hospital course complicated by [facility], AKI following diuresis, and anemia requiring 1 unit pRBCs & IV iron. Remains on supplemental O2 and a lasix infusion, chest tube with significant output.   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 On lasix infusion as below.  # acute on chronic HFmrEF (45-50%, global hypo, g2dd 03/2022) Clinically hypervolemic, although improving. 3+ edema in lower extremities on admission, still with supplemental oxygen requirement.  -agree with lasix gtt at '4mg'$ /hr per nephroloyg -restart GDMT as BP, renal function allows. Relative hypotension precluding this currently - on coreg 6.'25mg'$  BID at home   # CAD without chest pain, hx PCI to mid LAD (2001) # demand ischemia  Chest pain free, EKGs showing NSR, RBBB, nonspecific T wave changes. Troponins trended 738, 747, 1039, most consistent with demand/supply mismatch and not ACS.  -once Hgb stable, restart aspirin '81mg'$  daily -continue atorvastatin '80mg'$  daily   # acute on chronic anemia S/p 1 unit PRBCS 1/30, IV iron   This patient's plan of care was discussed and created with Dr. Saralyn Pilar and he is in agreement.  Signed: Tristan Schroeder , PA-C 04/15/2022, 10:41 AM [facility]

## 2022-04-15 NOTE — Progress Notes (Signed)
PULMONOLOGY         Date: 04/15/2022,   MRN# [id number] [name] 23-Feb-1951     AdmissionWeight: (!) 138.7 kg                 CurrentWeight: (!) 138.7 kg  Referring provider: Dr Roosevelt Locks   CHIEF COMPLAINT:   Parapneumonic effusion s/p chest tube placement    HISTORY OF PRESENT ILLNESS   This is a pleasant 72 year old male with severe morbid obesity chronic 3+ weeping lower extremity edema due to CKD with Membranous glomerulonephritis disease and recent evaluation with nephrology for rituximab infusion, history of CHF with reduced EF at 40%, osteoarthritis, diabetes, history of melanoma, neuropathy, spinal stenosis with chief complaint of shortness of breath and chest discomfort.  Patient is well-known to our pulmonology service with [facility] clinic we recently saw him for acute on chronic hypoxemic respiratory failure noted to have left-sided large effusion and right-sided mild to moderate effusion.  Status postthoracentesis x 2 with over 1 L drained.  This is a third time that he is shown to have a large pleural effusion in stent 3 months.  He did receive pleural drain upon arrival to the ER with 1500 cc serosanguineous return overnight and active drainage this a.m. and up to 150 cc before lunchtime.  On interview and physical exam patient reports slight improvement mild chest discomfort at pigtail insertion site.  Chest tube management displays no airleak upon forced expiratory maneuver.   04/14/22- patient examined at bedside, s/p repeat CBC with severe anemia requiring PRBC transfusion. S/p [place] abd no ascites. Chest tube in place on left , persistent and recurrent effusion present on CXR 1/28.  Will repeat CXR again today.  On Lasix gtt with 600cc urine overnight. Development of transient hypotension with req for midodrine.   04/15/22-  patient reports nausea. He is on oxycodone.  He appears less swollen today. Overall he is net 1.3L negative and has active fluid per  Chest tube. He needs to start using incentive spirometer today.    PAST MEDICAL HISTORY   Past Medical History:  Diagnosis Date   Arthritis    CHF (congestive heart failure), NYHA class II, acute on chronic, systolic (HCC)    EF 61% 08/735   Coronary artery disease    [facility]   Diabetes mellitus without complication ([place])    Dysplastic nevus 08/26/2020   L flank, moderate atypia   Hyperlipidemia    Hypertension    Melanoma ([place]) 08/20/2015   Right neck. Superficial spreading, arising in nevus. Tumor thickness 1.44m, Anatomic level IV   Melanoma in situ ([place] 05/22/2020   R lateral neck ant to scar, exc 05/22/20   Numbness in right leg    due to back   Skin cancer    removed l arm   Spinal stenosis    Stented coronary artery 2000   X 1 STENT   Swelling of both lower extremities      SURGICAL HISTORY   Past Surgical History:  Procedure Laterality Date   CATARACT EXTRACTION W/PHACO Right 05/03/2019   Procedure: CATARACT EXTRACTION PHACO AND INTRAOCULAR LENS PLACEMENT (IOC) RIGHT DIABETIC 6.44 00:58.4 11.0%;  Surgeon: BLeandrew Koyanagi MD;  [place]  Service: Ophthalmology;  Laterality: Right;  DIABETIC   CATARACT EXTRACTION W/PHACO Left 06/28/2019   Procedure: CATARACT EXTRACTION PHACO AND INTRAOCULAR LENS PLACEMENT (IOC) LEFT DIABETIC 7.99  01:13.4  10.9% ;  Surgeon: BLeandrew Koyanagi MD;  [facility];  Service: Ophthalmology;  Laterality: Left;  Diabetic - insulin and oral meds   CHOLECYSTECTOMY     JOINT REPLACEMENT     RT KNEE   LUMBAR LAMINECTOMY/DECOMPRESSION MICRODISCECTOMY  04/13/2012   Procedure: LUMBAR LAMINECTOMY/DECOMPRESSION MICRODISCECTOMY 1 LEVEL;  Surgeon: Johnn Hai, MD;  Location: [facility];  Service: Orthopedics;  Laterality: Bilateral;  L4-L5   UVULECTOMY  1990's     FAMILY HISTORY   History reviewed. No pertinent family history.   SOCIAL HISTORY   Social History   Tobacco  Use   Smoking status: Never   Smokeless tobacco: Never  Vaping Use   Vaping Use: Never used  Substance Use Topics   Alcohol use: Yes    Comment: rare   Drug use: No     MEDICATIONS    Home Medication:     Current Medication:  Current Facility-Administered Medications:    0.9 %  sodium chloride infusion (Manually program via Guardrails IV Fluids), , Intravenous, Once, Sharen Hones, MD   acetaminophen (TYLENOL) tablet 650 mg, 650 mg, Oral, Q6H PRN **OR** acetaminophen (TYLENOL) suppository 650 mg, 650 mg, Rectal, Q6H PRN, Sid Falcon, MD   atorvastatin (LIPITOR) tablet 80 mg, 80 mg, Oral, Daily, Gilles Chiquito B, MD, 80 mg at 04/14/22 1000   enoxaparin (LOVENOX) injection 70 mg, 0.5 mg/kg, Subcutaneous, Q24H, Gilles Chiquito B, MD, 70 mg at 04/14/22 1002   furosemide (LASIX) 200 mg in dextrose 5 % 100 mL (2 mg/mL) infusion, 4 mg/hr, Intravenous, Continuous, Lateef, Munsoor, MD, Last Rate: 2 mL/hr at 04/14/22 0507, 4 mg/hr at 04/14/22 0507   guaiFENesin-dextromethorphan (ROBITUSSIN DM) 100-10 MG/5ML syrup 5 mL, 5 mL, Oral, Q4H PRN, Gilles Chiquito B, MD, 5 mL at 04/13/22 2154   HYDROmorphone (DILAUDID) injection 0.5-1 mg, 0.5-1 mg, Intravenous, Q2H PRN, Gilles Chiquito B, MD, 1 mg at 04/14/22 2136   insulin aspart (novoLOG) injection 0-15 Units, 0-15 Units, Subcutaneous, TID WC, Gilles Chiquito B, MD, 3 Units at 04/14/22 1721   insulin aspart (novoLOG) injection 0-5 Units, 0-5 Units, Subcutaneous, QHS, Gilles Chiquito B, MD, 2 Units at 04/13/22 2153   insulin glargine-yfgn (SEMGLEE) injection 25 Units, 25 Units, Subcutaneous, QHS, Sharen Hones, MD, 25 Units at 04/14/22 2136   methocarbamol (ROBAXIN) 500 mg in dextrose 5 % 50 mL IVPB, 500 mg, Intravenous, Q6H PRN, Sid Falcon, MD   midodrine (PROAMATINE) tablet 5 mg, 5 mg, Oral, TID WC, Singh, Harmeet, MD, 5 mg at 04/14/22 1711   ondansetron (ZOFRAN) tablet 4 mg, 4 mg, Oral, Q6H PRN **OR** ondansetron (ZOFRAN) injection 4 mg, 4 mg,  Intravenous, Q6H PRN, Gilles Chiquito B, MD, 4 mg at 04/15/22 0447   oseltamivir (TAMIFLU) capsule 30 mg, 30 mg, Oral, Daily, Gilles Chiquito B, MD, 30 mg at 04/14/22 1001   oxyCODONE (Oxy IR/ROXICODONE) immediate release tablet 5 mg, 5 mg, Oral, Q4H PRN, Gilles Chiquito B, MD, 5 mg at 04/13/22 2154   sodium chloride flush (NS) 0.9 % injection 3 mL, 3 mL, Intravenous, Q12H, Gilles Chiquito B, MD, 3 mL at 04/14/22 2137    ALLERGIES   Carvedilol, Metoprolol, Toprol xl [metoprolol tartrate], Buprenorphine, Buprenorphine hcl, Morphine, Morphine and related, Triamcinolone, and Triamcinolone acetonide     REVIEW OF SYSTEMS    Review of Systems:  Gen:  Denies  fever, sweats, chills weigh loss  HEENT: Denies blurred vision, double vision, ear pain, eye pain, hearing loss, nose bleeds, sore throat Cardiac:  No dizziness, chest pain or heaviness, chest tightness,edema Resp:  reports dyspnea chronically  Gi: Denies swallowing difficulty, stomach pain, nausea or vomiting, diarrhea, constipation, bowel incontinence Gu:  Denies bladder incontinence, burning urine Ext:   Denies Joint pain, stiffness or swelling Skin: Denies  skin rash, easy bruising or bleeding or hives Endoc:  Denies polyuria, polydipsia , polyphagia or weight change Psych:   Denies depression, insomnia or hallucinations   Other:  All other systems negative   VS: BP 120/61 (BP Location: Right Arm)   Pulse 87   Temp 97.9 F (36.6 C)   Resp 18   Ht 6' (1.829 m)   Wt (!) 138.7 kg   SpO2 91%   BMI 41.47 kg/m      PHYSICAL EXAM    GENERAL:NAD, no fevers, chills, no weakness no fatigue HEAD: Normocephalic, atraumatic.  EYES: Pupils equal, round, reactive to light. Extraocular muscles intact. No scleral icterus.  MOUTH: Moist mucosal membrane. Dentition intact. No abscess noted.  EAR, NOSE, THROAT: Clear without exudates. No external lesions.  NECK: Supple. No thyromegaly. No nodules. No JVD.  PULMONARY: decreased breath  sounds with mild rhonchi worse at bases bilaterally.  CARDIOVASCULAR: S1 and S2. Regular rate and rhythm. No murmurs, rubs, or gallops. No edema. Pedal pulses 2+ bilaterally.  GASTROINTESTINAL: Soft, nontender, nondistended. No masses. Positive bowel sounds. No hepatosplenomegaly.  MUSCULOSKELETAL: No swelling, clubbing, or edema. Range of motion full in all extremities.  NEUROLOGIC: Cranial nerves II through XII are intact. No gross focal neurological deficits. Sensation intact. Reflexes intact.  SKIN: No ulceration, lesions, rashes, or cyanosis. Skin warm and dry. Turgor intact.  PSYCHIATRIC: Mood, affect within normal limits. The patient is awake, alert and oriented x 3. Insight, judgment intact.       IMAGING     ASSESSMENT/PLAN   Acute on chronic hypoxemic respiratory failure Present on admission due to influenza A infection with recurrent left pleural effusion and associated compressive atelectasis -Patient's evaluated by nephrology and is status post Lasix drip as well as pigtail chest tube on the left with 1500 cc serosanguineous fluid overnight and active drain during examination today -Is no air leak upon forced aspiratory maneuver, chest tube is not clogged and fluid is freely flowing. -Fluid was previously tested and was noted to be lymphocyte predominant fluid and became exudative upon initiation of diuretics which is known to happen.  Does not appear to be empyema and I do not think he is a very good candidate for intrapleural thrombolytics.  Patient may be heading towards dialysis but if not he may also benefit from thoracic surgery evaluation and possible pleurodesis.  Discussed this care plan with son and patient at bedside as well as attending physician Dr. Roosevelt Locks    Thank you for allowing me to participate in the care of this patient.   Patient/Family are satisfied with care plan and all questions have been answered.    Provider disclosure: Patient with at least one  acute or chronic illness or injury that poses a threat to life or bodily function and is being managed actively during this encounter.  All of the below services have been performed independently by signing provider:  review of prior documentation from internal and or external health records.  Review of previous and current lab results.  Interview and comprehensive assessment during patient visit today. Review of current and previous chest radiographs/CT scans. Discussion of management and test interpretation with health care team and patient/family.   This document was prepared using Dragon voice recognition software and may include unintentional  dictation errors.     Ottie Glazier, M.D.  Division of Pulmonary & Critical Care Medicine

## 2022-04-16 DIAGNOSIS — J101 Influenza due to other identified influenza virus with other respiratory manifestations: Secondary | ICD-10-CM | POA: Diagnosis not present

## 2022-04-16 LAB — BASIC METABOLIC PANEL
Anion gap: 10 (ref 5–15)
BUN: 83 mg/dL — ABNORMAL HIGH (ref 8–23)
CO2: 26 mmol/L (ref 22–32)
Calcium: 7.7 mg/dL — ABNORMAL LOW (ref 8.9–10.3)
Chloride: 101 mmol/L (ref 98–111)
Creatinine, Ser: 2.08 mg/dL — ABNORMAL HIGH (ref 0.61–1.24)
GFR, Estimated: 33 mL/min — ABNORMAL LOW (ref >60)
Glucose, Bld: 114 mg/dL — ABNORMAL HIGH (ref 70–99)
Potassium: 3.5 mmol/L (ref 3.5–5.1)
Sodium: 137 mmol/L (ref 135–145)

## 2022-04-16 LAB — CBC
HCT: 22 % — ABNORMAL LOW (ref 39.0–52.0)
Hemoglobin: 7.2 g/dL — ABNORMAL LOW (ref 13.0–17.0)
MCH: 28.9 pg (ref 26.0–34.0)
MCHC: 32.7 g/dL (ref 30.0–36.0)
MCV: 88.4 fL (ref 80.0–100.0)
Platelets: 196 10*3/uL (ref 150–400)
RBC: 2.49 MIL/uL — ABNORMAL LOW (ref 4.22–5.81)
RDW: 15.9 % — ABNORMAL HIGH (ref 11.5–15.5)
WBC: 4.1 10*3/uL (ref 4.0–10.5)
nRBC: 0 % (ref 0.0–0.2)

## 2022-04-16 LAB — CULTURE, BLOOD (ROUTINE X 2)
Culture: NO GROWTH
Culture: NO GROWTH
Special Requests: ADEQUATE

## 2022-04-16 LAB — PHOSPHORUS: Phosphorus: 4.6 mg/dL (ref 2.5–4.6)

## 2022-04-16 LAB — GLUCOSE, CAPILLARY
Glucose-Capillary: 100 mg/dL — ABNORMAL HIGH (ref 70–99)
Glucose-Capillary: 167 mg/dL — ABNORMAL HIGH (ref 70–99)
Glucose-Capillary: 200 mg/dL — ABNORMAL HIGH (ref 70–99)
Glucose-Capillary: 191 mg/dL — ABNORMAL HIGH (ref 70–99)

## 2022-04-16 LAB — MAGNESIUM: Magnesium: 2.5 mg/dL — ABNORMAL HIGH (ref 1.7–2.4)

## 2022-04-16 MED ORDER — GUAIFENESIN ER 600 MG PO TB12
600.0000 mg | ORAL_TABLET | Freq: Two times a day (BID) | ORAL | Status: DC
  Administered 2022-04-16 – 2022-04-26 (×20): 600 mg via ORAL
  Filled 2022-04-16 (×20): qty 1

## 2022-04-16 MED ORDER — ALBUMIN HUMAN 25 % IV SOLN
25.0000 g | Freq: Two times a day (BID) | INTRAVENOUS | Status: AC
  Administered 2022-04-17: 25 g via INTRAVENOUS

## 2022-04-16 MED ORDER — POTASSIUM CHLORIDE CRYS ER 20 MEQ PO TBCR
40.0000 meq | EXTENDED_RELEASE_TABLET | Freq: Once | ORAL | Status: AC
  Administered 2022-04-16: 40 meq via ORAL
  Filled 2022-04-16: qty 2

## 2022-04-16 NOTE — Progress Notes (Signed)
[facility] Progress Note  Patient: [name]    EXB:[id number]  DOA: 04/11/2022     Date of Service: the patient was seen and examined on 04/16/2022  Chief Complaint  Patient presents with   Shortness of Breath   Brief hospital course: [name] is a 72 y.o. male with medical history significant of arthritis, CHF (chronic), CAD, DM2, HLD, HTN, h/o treated melanoma, spinal stenosis who presents for worsening cough, fatigue and sob. Had low-grade fever, he was found to have positive for influenza A. He had recurrent left-sided pleural effusion, had a thoracentesis x 2 during the last months.  Upon arrival emergency room, chest x-ray showed large left-sided pleural effusion again, chest tube was placed by ED physician. Patient also developed significant hypoxemia with oxygen saturation as low as 76% on room air this morning.  Treated with oxygen. Patient has severe volume overload with evidence of bichamber heart failure.  Echocardiogram showed EF 45-50% with grade 2 diastolic dysfunction. Pulm valve not visualized.  Furosemide drip started after seen by nephrology.   1/29.  Patient had minimal urine after Lasix, but chest tube had drained additional 1500 mL in 24 hours.    Assessment and Plan: Acute on chronic combined systolic and diastolic congestive heart failure Acute hypoxemic respiratory failure secondary to volume overload. Elevated troponin with NSTEMI. Coronary artery disease. Patient has significant hypoxemia, oxygen dropped down to 76% when oxygen is off.  Patient has significant short of breath with minimal exertion. Patient also has significant volume overload.  He has evidence of bichamber heart failure.  He has severe bilateral leg edema.  Also has significant elevation of BNP. Discussed with nephrology and cardiology,started lasix drip Abdominal ultrasound did not show any ascites. Patient has been making urine, urine output was not accurate due to acute  incontinent of urine.  Continue to follow with Lasix drip. Patient also has significant drainage from the left-sided chest tube, which may have made the volume status better. Continue oxygen, oxygenation seems to be better.   Influenza A. Continue Tamiflu for 5 days till 04/16/22 2/1 started Mucinex 600 mg p.o. twice daily due to chest congestion  Recurrent left side pleural effusion.  Hemothorax ruled out. Chest tube was placed in the emergency room, removed 2 L of serous fluids.  No evidence of bleeding.  Based on prior lab test, cytology was negative.  Prior test also does not indicate infection.  Reviewed chest x-ray post chest tube, no evidence of pneumonia. Procalcitonin level not significant elevated at 0.2 with chronic renal function.  No evidence of infection.  Patient is followed by pulmonology, patient appears to have rapid accumulation of pleural fluid.  Had a discussion with CT surgery, pulmonology and nephrology.  Patient still has a large amount of the drain from the chest tube, patient cannot be discharged with chest tube in place.  The source of increased pleural effusion is due to heart failure, third spacing from renal disease.  Patient will be given albumin, diuretics.  If this does not improve, we may have to use Pleurx catheter.   Acute kidney injury on chronic kidney disease stage II. Idiopathic membranous glomerulonephritis. Hyponatremia. Patient has a worsening renal function on chronic renal disease.  He is grossly volume overloaded, he is started on Lasix drip.  Renal function is gradually getting worse.  There is a possibility that patient may need a dialysis. Nephrology is working with oncology to start rituximab.    Iron deficient anemia. Acute blood  loss anemia ruled out. Patient received IV iron, hemoglobin dropped down to 6.9, chest tube fluids does not seem to be bloody.  s/p 1 unit PRBC.  No evidence of GI bleed. Hb 7.2   Continue to monitor H&H and transfuse  if hemoglobin less than 7   Uncontrolled type 2 diabetes with hyperglycemia Continue long-acting insulin, sliding scale insulin.   Essential hypertension Continue to monitor.   Morbid obesity BMI 41.47, Likely obstructive sleep apnea. Diet exercise. Will decide if patient need BiPAP before discharge.   Body mass index is 41.47 kg/m.  Interventions:       Diet: Carb modified diet DVT Prophylaxis: Subcutaneous Lovenox   Advance goals of care discussion: Full code  Family Communication: family was not present at bedside, at the time of interview.  The pt provided permission to discuss medical plan with the family. Opportunity was given to ask question and all questions were answered satisfactorily.   Disposition:  Pt is from Home, admitted with anasarca and left pleural effusion, still has left-sided chest tube and on IV Lasix infusion, which precludes a safe discharge. Discharge to home, when clinically stable, needs pulmonary clearance.  Subjective: No significant events overnight, patient stated that he had a productive cough and a lot of phlegm was coming out last night, feels improvement now, no worsening of shortness of breath.  Denies any chest pain or palpitation.   Physical Exam: General: NAD, lying comfortably Appear in no distress, affect appropriate Eyes: PERRLA ENT: Oral Mucosa Clear, moist  Neck: no JVD,  Cardiovascular: S1 and S2 Present, no Murmur,  Respiratory: good respiratory effort, Bilateral Air entry equal and Decreased, bilateral crackles, decreased breath sounds in the left lower chest, no wheezing, left chest tube intact Abdomen: Bowel Sound present, Soft and no tenderness,  Skin: no rashes Extremities: 4+ Pedal edema, no calf tenderness Neurologic: without any new focal findings Gait not checked due to patient safety concerns  Vitals:   04/16/22 0123 04/16/22 0449 04/16/22 0844 04/16/22 1138  BP: 105/63 126/60 (!) 114/50 (!) 123/51  Pulse:  78 77 80 76  Resp: '20 20 18 16  '$ Temp: 97.7 F (36.5 C) 97.7 F (36.5 C) 98.1 F (36.7 C) 98 F (36.7 C)  TempSrc: Oral Oral    SpO2: 98% 99% 100% 99%  Weight:      Height:        Intake/Output Summary (Last 24 hours) at 04/16/2022 1416 Last data filed at 04/16/2022 1139 Gross per 24 hour  Intake 480 ml  Output 1040 ml  Net -560 ml   Filed Weights   04/11/22 2039  Weight: (!) 138.7 kg    Data Reviewed: I have personally reviewed and interpreted daily labs, tele strips, imagings as discussed above. I reviewed all nursing notes, pharmacy notes, vitals, pertinent old records I have discussed plan of care as described above with RN and patient/family.  CBC: Recent Labs  Lab 04/11/22 2033 04/12/22 0500 04/13/22 0520 04/14/22 0506 04/15/22 0235 04/16/22 0445  WBC 5.3 5.5 4.0 3.9* 5.1 4.1  NEUTROABS 4.5  --   --   --   --   --   HGB 8.1* 8.0* 7.3* 6.9* 7.7* 7.2*  HCT 25.8* 26.0* 23.5* 21.4* 24.3* 22.0*  MCV 89.9 92.5 90.7 88.4 88.4 88.4  PLT 193 189 195 180 204 824   Basic Metabolic Panel: Recent Labs  Lab 04/12/22 0500 04/13/22 0520 04/14/22 0506 04/15/22 0235 04/16/22 0445  NA 133* 132* 132* 135 137  K 4.1 4.0 3.8 3.8 3.5  CL 98 97* 100 99 101  CO2 '24 24 26 29 26  '$ GLUCOSE 224* 253* 141* 148* 114*  BUN 69* 76* 82* 83* 83*  CREATININE 1.92* 2.03* 2.16* 2.26* 2.08*  CALCIUM 7.9* 7.6* 7.4* 7.6* 7.7*  MG  --  2.2 2.4 2.4 2.5*  PHOS  --   --   --   --  4.6    Studies: DG Chest Port 1 View  Result Date: 04/15/2022 CLINICAL DATA:  Left pleural effusion. EXAM: PORTABLE CHEST 1 VIEW COMPARISON:  April 14, 2022. FINDINGS: Stable cardiomegaly. Stable left basilar atelectasis or infiltrate is noted with associated pleural effusion. Minimal right basilar subsegmental atelectasis is noted. Bony thorax is unremarkable. IMPRESSION: Stable left basilar atelectasis or infiltrate is noted with associated pleural effusion. Minimal right basilar subsegmental atelectasis.  Electronically Signed   By: Marijo Conception M.D.   On: 04/15/2022 14:49    Scheduled Meds:  sodium chloride   Intravenous Once   vitamin C  500 mg Oral Daily   atorvastatin  80 mg Oral Daily   enoxaparin (LOVENOX) injection  0.5 mg/kg Subcutaneous Q24H   insulin aspart  0-15 Units Subcutaneous TID WC   insulin aspart  0-5 Units Subcutaneous QHS   insulin glargine-yfgn  25 Units Subcutaneous QHS   iron polysaccharides  150 mg Oral Daily   midodrine  5 mg Oral TID WC   sodium chloride flush  3 mL Intravenous Q12H   Continuous Infusions:  albumin human 25 g (04/16/22 0925)   furosemide (LASIX) 200 mg in dextrose 5 % 100 mL (2 mg/mL) infusion 4 mg/hr (04/15/22 2221)   methocarbamol (ROBAXIN) IV     PRN Meds: acetaminophen **OR** acetaminophen, guaiFENesin-dextromethorphan, HYDROmorphone (DILAUDID) injection, methocarbamol (ROBAXIN) IV, ondansetron **OR** ondansetron (ZOFRAN) IV, oxyCODONE  Time spent: 35 minutes  Author: Val Riles. MD [facility] 04/16/2022 2:16 PM  To reach On-call, see care teams to locate the attending and reach out to them via [email or web address]. If 7PM-7AM, please contact night-coverage If you still have difficulty reaching the attending provider, please page the [facility] (Director on Call) for [facility] on amion for assistance.

## 2022-04-16 NOTE — Progress Notes (Signed)
[place] NOTE       Patient ID: [name] MRN: [id number] DOB/AGE: [birth year] 74 y.o.  Admit date: 04/11/2022 Referring Physician Dr. Roosevelt Locks Primary Physician Dr. Lovie Macadamia Primary Cardiologist Dr. [name], Leroy Libman, PA-C  Reason for Consultation AoCHF  HPI: [name] is a 2yoM with a PMH of HFmrEF (45-50%, global hypo, mod LVH, g2dd 03/2022), CAD s/p PCI mid LAD (2001), HLD, DM2, IDA, melanoma s/p excision (2018), idiopathic membranous glomerulonephritis w/ nephrotic syndrome, recurrent bilateral pleural effusions who presented to [facility] ED 04/11/22 with confusion, hemoptysis and increased shortness of breath x 1 day. Positive for influenza A. Febrile and tachycardic on admission, CTA chest with large left effusion s/p pigtail chest tube in the ED on 1/28. Hospital course complicated by [facility], AKI following diuresis, and anemia requiring 1 unit pRBCs & IV iron. Remains on supplemental O2 and a lasix infusion, chest tube with significant output.   Interval History:  - no acute events.  - feels better in general today, has good appetite. Eating breakfast. No chest pain or shortness of breath. Some discomfort at L chest tube side but manageable.  - renal function with slight improvement today, remains on lasix infusion   Review of systems complete and found to be negative unless listed above     Past Medical History:  Diagnosis Date   Arthritis    CHF (congestive heart failure), NYHA class II, acute on chronic, systolic (HCC)    EF 06% 05/158   Coronary artery disease    [facility]   Diabetes mellitus without complication ([place])    Dysplastic nevus 08/26/2020   L flank, moderate atypia   Hyperlipidemia    Hypertension    Melanoma ([place]) 08/20/2015   Right neck. Superficial spreading, arising in nevus. Tumor thickness 1.23m, Anatomic level IV   Melanoma in situ ([place] 05/22/2020   R lateral neck ant to scar, exc 05/22/20    Numbness in right leg    due to back   Skin cancer    removed l arm   Spinal stenosis    Stented coronary artery 2000   X 1 STENT   Swelling of both lower extremities     Past Surgical History:  Procedure Laterality Date   CATARACT EXTRACTION W/PHACO Right 05/03/2019   Procedure: CATARACT EXTRACTION PHACO AND INTRAOCULAR LENS PLACEMENT (IOC) RIGHT DIABETIC 6.44 00:58.4 11.0%;  Surgeon: BLeandrew Koyanagi MD;  Location: [facility]  Service: Ophthalmology;  Laterality: Right;  DIABETIC   CATARACT EXTRACTION W/PHACO Left 06/28/2019   Procedure: CATARACT EXTRACTION PHACO AND INTRAOCULAR LENS PLACEMENT (IOC) LEFT DIABETIC 7.99  01:13.4  10.9% ;  Surgeon: BLeandrew Koyanagi MD;  [place]  Service: Ophthalmology;  Laterality: Left;  Diabetic - insulin and oral meds   CHOLECYSTECTOMY     JOINT REPLACEMENT     RT KNEE   LUMBAR LAMINECTOMY/DECOMPRESSION MICRODISCECTOMY  04/13/2012   Procedure: LUMBAR LAMINECTOMY/DECOMPRESSION MICRODISCECTOMY 1 LEVEL;  Surgeon: JJohnn Hai MD;  Location[facility];  Service: Orthopedics;  Laterality: Bilateral;  L4-L5   UVULECTOMY  1990's    Medications Prior to Admission  Medication Sig Dispense Refill Last Dose   aspirin EC 81 MG tablet Take 162 mg by mouth daily. Swallow whole.   04/10/2022   atorvastatin (LIPITOR) 80 MG tablet Take 80 mg by mouth daily.   04/11/2022   Continuous Blood Gluc Receiver (DEXCOM G6 RECEIVER) DEVI Use to monitor blood sugar. [phone]  04/11/2022  Continuous Blood Gluc Sensor (DEXCOM G6 SENSOR) MISC Use to monitor blood sugar.  Replace every 10 days. [place] [id number].   04/11/2022   Continuous Blood Gluc Transmit (DEXCOM G6 TRANSMITTER) MISC Use to monitor blood sugar.  Replace every 3 months. [place] [id number].   04/11/2022   insulin glargine (LANTUS) 100 UNIT/ML injection Inject 34 Units into the skin daily.   04/10/2022   metFORMIN (GLUCOPHAGE) 500 MG tablet Take 1,000 mg by mouth daily  with supper.      torsemide (DEMADEX) 100 MG tablet Take 100 mg by mouth daily.   04/10/2022   amLODipine (NORVASC) 5 MG tablet Take 5 mg by mouth daily. (Patient not taking: Reported on 04/12/2022)   Not Taking   FEROSUL 325 (65 Fe) MG tablet Take 1 tablet (325 mg total) by mouth 2 (two) times daily. (Patient not taking: Reported on 04/12/2022) 180 tablet 1 Not Taking   ferrous sulfate (FEROSUL) 325 (65 FE) MG tablet Take by mouth.   04/10/2022   furosemide (LASIX) 20 MG tablet Take by mouth. (Patient not taking: Reported on 04/12/2022)   Not Taking   furosemide (LASIX) 80 MG tablet Take by mouth. (Patient not taking: Reported on 04/12/2022)   Not Taking   glipiZIDE (GLUCOTROL XL) 5 MG 24 hr tablet       insulin aspart (NOVOLOG) 100 UNIT/ML injection Inject into the skin 3 (three) times daily before meals.      Insulin Pen Needle (COMFORT EZ PEN NEEDLES) 32G X 8 MM MISC See admin instructions.      isosorbide mononitrate (IMDUR) 30 MG 24 hr tablet Take by mouth.      JARDIANCE 10 MG TABS tablet       LANTUS SOLOSTAR 100 UNIT/ML Solostar Pen SMARTSIG:44 Unit(s) SUB-Q Every Night      losartan (COZAAR) 100 MG tablet  (Patient not taking: Reported on 04/12/2022)   Not Taking   NOVOLOG FLEXPEN 100 UNIT/ML FlexPen INJ UP TO 50 UNI D [place] IN MULTIPLE INJECTIONS B MEALS UTD      spironolactone (ALDACTONE) 25 MG tablet Take 50 mg by mouth daily.   04/10/2022   urea (CARMOL) 10 % cream Apply topically. (Patient not taking: Reported on 02/19/2022)      valsartan (DIOVAN) 320 MG tablet Take 320 mg by mouth daily.   04/10/2022   Social History   Socioeconomic History   Marital status: Widowed    Spouse name: Not on file   Number of children: Not on file   Years of education: Not on file   Highest education level: Not on file  Occupational History   Not on file  Tobacco Use   Smoking status: Never   Smokeless tobacco: Never  Vaping Use   Vaping Use: Never used  Substance and Sexual Activity   Alcohol use:  Yes    Comment: rare   Drug use: No   Sexual activity: Not on file  Other Topics Concern   Not on file  Social History Narrative   Not on file   Social Determinants of Health   Financial Resource Strain: Not on file  Food Insecurity: No Food Insecurity (04/13/2022)   Hunger Vital Sign    [name]: Never true    [name]: Never true  Transportation Needs: No Transportation Needs (04/13/2022)   PRAPARE - Transportation    Lack of Transportation (Medical): No    Lack of  Transportation (Non-Medical): No  Physical Activity: Not on file  Stress: Not on file  Social Connections: Not on file  Intimate Partner Violence: Not At Risk (04/13/2022)   Humiliation, Afraid, Rape, and Kick questionnaire    Fear of Current or Ex-Partner: No    Emotionally Abused: No    Physically Abused: No    Sexually Abused: No    History reviewed. No pertinent family history.    Intake/Output Summary (Last 24 hours) at 04/16/2022 0829 Last data filed at 04/16/2022 0500 Gross per 24 hour  Intake 1560 ml  Output 950 ml  Net 610 ml     Vitals:   04/15/22 1619 04/15/22 2208 04/16/22 0123 04/16/22 0449  BP: (!) 126/58 (!) 122/59 105/63 126/60  Pulse: 76 79 78 77  Resp: '18 20 20 20  '$ Temp: 97.9 F (36.6 C) 98 F (36.7 C) 97.7 F (36.5 C) 97.7 F (36.5 C)  TempSrc:  Oral Oral Oral  SpO2: 100% 100% 98% 99%  Weight:      Height:        PHYSICAL EXAM General: ill appearing caucasian male, in no acute distress. Sitting upright in bed, eating breakfast HEENT:  Normocephalic and atraumatic. Neck:  No JVD.  Lungs: Normal respiratory effort on O2 by [place]. Decreased breath sounds with rhonchi throughout Chest: L pigtail chest tube, straw colored output Heart: HRRR . Normal S1 and S2 without gallops or murmurs.  Abdomen: Non-distended appearing.  Msk: Normal strength and tone for age. Extremities:  No clubbing, cyanosis. 1+ bilateral LE edema.  ACE wraps up to knees. Generalized pedal edema Neuro: Alert and oriented X 3. Psych:  Answers questions appropriately.   Labs: Basic Metabolic Panel: Recent Labs    04/15/22 0235 04/16/22 0445  NA 135 137  K 3.8 3.5  CL 99 101  CO2 29 26  GLUCOSE 148* 114*  BUN 83* 83*  CREATININE 2.26* 2.08*  CALCIUM 7.6* 7.7*  MG 2.4 2.5*  PHOS  --  4.6    Liver Function Tests: Recent Labs    04/15/22 0235  AST 20  ALT 11  ALKPHOS 60  BILITOT 1.2  PROT 5.6*  ALBUMIN 2.9*    No results for input(s): "LIPASE", "AMYLASE" in the last 72 hours. CBC: Recent Labs    04/15/22 0235 04/16/22 0445  WBC 5.1 4.1  HGB 7.7* 7.2*  HCT 24.3* 22.0*  MCV 88.4 88.4  PLT 204 196    Cardiac Enzymes: No results for input(s): "CKTOTAL", "CKMB", "CKMBINDEX", "TROPONINIHS" in the last 72 hours. BNP: No results for input(s): "BNP" in the last 72 hours. D-Dimer: No results for input(s): "DDIMER" in the last 72 hours. Hemoglobin A1C: No results for input(s): "HGBA1C" in the last 72 hours. Fasting Lipid Panel: No results for input(s): "CHOL", "HDL", "LDLCALC", "TRIG", "CHOLHDL", "LDLDIRECT" in the last 72 hours. Thyroid Function Tests: No results for input(s): "TSH", "T4TOTAL", "T3FREE", "THYROIDAB" in the last 72 hours.  Invalid input(s): "FREET3" Anemia Panel: No results for input(s): "VITAMINB12", "FOLATE", "FERRITIN", "TIBC", "IRON", "RETICCTPCT" in the last 72 hours.   Radiology: [facility] Chest Port 1 View  Result Date: 04/15/2022 CLINICAL DATA:  Left pleural effusion. EXAM: PORTABLE CHEST 1 VIEW COMPARISON:  April 14, 2022. FINDINGS: Stable cardiomegaly. Stable left basilar atelectasis or infiltrate is noted with associated pleural effusion. Minimal right basilar subsegmental atelectasis is noted. Bony thorax is unremarkable. IMPRESSION: Stable left basilar atelectasis or infiltrate is noted with associated pleural effusion. Minimal right basilar subsegmental atelectasis. Electronically Signed  By: Marijo Conception M.D.   On: 04/15/2022 14:49   DG Chest Port 1 View  Result Date: 04/14/2022 CLINICAL DATA:  Left-sided pleural effusion EXAM: PORTABLE CHEST 1 VIEW COMPARISON:  04/12/2022 FINDINGS: Cardiomegaly. Similar appearance of layering left pleural effusion and associated atelectasis or consolidation. Left-sided chest tube remains in position about the left lung base. No pneumothorax. No new airspace opacity. Osseous structures unremarkable. The visualized skeletal structures are unremarkable. IMPRESSION: 1. Similar appearance of layering left pleural effusion and associated atelectasis or consolidation. Left-sided chest tube remains in position about the left lung base. No pneumothorax. No new airspace opacity. 2. Cardiomegaly. Electronically Signed   By: Delanna Ahmadi M.D.   On: 04/14/2022 12:58   ECHOCARDIOGRAM COMPLETE  Result Date: 04/12/2022    ECHOCARDIOGRAM REPORT   Patient Name:   [name] Date of Exam: 04/12/2022 Medical Rec #:  [id number]      Height:       72.0 in Accession #:    [id number]     Weight:       305.8 lb Date of Birth:  1950-07-15      BSA:          2.552 m Patient Age:    16 years       BP:           136/99 mmHg Patient Gender: M              HR:           101 bpm. Exam Location:  [facility] Procedure: 2D Echo Indications:     CHF I50.21  History:         Patient has no prior history of Echocardiogram examinations.  Sonographer:     Kathlen Brunswick RDCS Referring Phys:  [id number] Sharen Hones Diagnosing Phys: Yolonda Kida MD  Sonographer Comments: Technically challenging study due to limited acoustic windows, patient is obese and suboptimal apical window. Image acquisition challenging due to patient body habitus. Difficult and challenging study due to left side chest tube and  drain at the time of this study. IMPRESSIONS  1. Left ventricular ejection fraction, by estimation, is 45 to 50%. The left ventricle has mildly decreased function. The left ventricle demonstrates  global hypokinesis. The left ventricular internal cavity size was moderately to severely dilated. There  is moderate concentric left ventricular hypertrophy. Left ventricular diastolic parameters are consistent with Grade II diastolic dysfunction (pseudonormalization).  2. Right ventricular systolic function is mildly reduced. The right ventricular size is moderately enlarged. Mildly increased right ventricular wall thickness.  3. Left atrial size was mildly dilated.  4. Right atrial size was mildly dilated.  5. Moderate pleural effusion in the left lateral region.  6. The mitral valve was not well visualized. Trivial mitral valve regurgitation.  7. The aortic valve is grossly normal. Aortic valve regurgitation is not visualized. Aortic valve sclerosis is present, with no evidence of aortic valve stenosis. FINDINGS  Left Ventricle: Left ventricular ejection fraction, by estimation, is 45 to 50%. The left ventricle has mildly decreased function. The left ventricle demonstrates global hypokinesis. The left ventricular internal cavity size was moderately to severely dilated. There is moderate concentric left ventricular hypertrophy. Left ventricular diastolic parameters are consistent with Grade II diastolic dysfunction (pseudonormalization). Right Ventricle: The right ventricular size is moderately enlarged. Mildly increased right ventricular wall thickness. Right ventricular systolic function is mildly reduced. Left Atrium: Left atrial size was mildly dilated. Right Atrium: Right atrial size was  mildly dilated. Pericardium: There is no evidence of pericardial effusion. Mitral Valve: The mitral valve was not well visualized. Trivial mitral valve regurgitation. Tricuspid Valve: The tricuspid valve is not well visualized. Tricuspid valve regurgitation is mild. Aortic Valve: The aortic valve is grossly normal. Aortic valve regurgitation is not visualized. Aortic valve sclerosis is present, with no evidence of aortic  valve stenosis. Aortic valve peak gradient measures 3.0 mmHg. Pulmonic Valve: The pulmonic valve was normal in structure. Pulmonic valve regurgitation is not visualized. Aorta: The aortic arch was not well visualized. IAS/Shunts: No atrial level shunt detected by color flow Doppler. Additional Comments: There is a moderate pleural effusion in the left lateral region.  LEFT VENTRICLE PLAX 2D LVIDd:         5.90 cm      Diastology LVIDs:         4.60 cm      LV e' medial:    8.49 cm/s LV PW:         1.50 cm      LV E/e' medial:  13.5 LV IVS:        1.60 cm      LV e' lateral:   8.49 cm/s LVOT diam:     2.30 cm      LV E/e' lateral: 13.5 LV SV:         43 LV SV Index:   17 LVOT Area:     4.15 cm  LV Volumes (MOD) LV vol d, MOD A4C: 254.0 ml LV vol s, MOD A4C: 175.0 ml LV SV MOD A4C:     254.0 ml LEFT ATRIUM           Index LA diam:      4.20 cm 1.65 cm/m LA Vol (A4C): 43.0 ml 16.85 ml/m  AORTIC VALVE                 PULMONIC VALVE AV Area (Vmax): 2.99 cm     PV Vmax:       0.67 m/s AV Vmax:        86.60 cm/s   PV Peak grad:  1.8 mmHg AV Peak Grad:   3.0 mmHg LVOT Vmax:      62.30 cm/s LVOT Vmean:     41.800 cm/s LVOT VTI:       0.103 m  AORTA Ao Root diam: 3.70 cm Ao Asc diam:  2.90 cm MITRAL VALVE MV Area (PHT): 6.90 cm     SHUNTS MV Decel Time: 110 msec     Systemic VTI:  0.10 m MV E velocity: 115.00 cm/s  Systemic Diam: 2.30 cm MV A velocity: 82.30 cm/s MV E/A ratio:  1.40 Yolonda Kida MD Electronically signed by Yolonda Kida MD Signature Date/Time: 04/12/2022/4:14:41 PM    Final    US Abdomen Complete  Result Date: 04/12/2022 CLINICAL DATA:  Cirrhosis with ascites. EXAM: ABDOMEN ULTRASOUND COMPLETE COMPARISON:  Right upper quadrant ultrasound on 04/08/2022 FINDINGS: Gallbladder: Surgically absent. Common bile duct: Diameter: 7 mm, within normal limits post cholecystectomy. Liver: Suboptimal visualization due to patient habitus and limited acoustic windows. No focal liver lesion identified. Within  normal limits in parenchymal echogenicity. Portal vein is patent on color Doppler imaging with normal direction of blood flow towards the liver. IVC: No abnormality visualized. Pancreas: Not visualized due to patient habitus and bowel gas. Spleen: Could not be visualized due to bandages from left chest tube. Right Kidney: Length: 11.9 cm. Echogenicity within normal limits. No mass  or hydronephrosis visualized. Left Kidney: Length: 11.8 cm. Echogenicity within normal limits. No mass or hydronephrosis visualized. Abdominal aorta: Not visualized due to overlying bowel gas. Other findings: No evidence of ascites. IMPRESSION: Technically limited exam due to patient habitus and bandages from chest tube. Prior cholecystectomy.  No evidence of biliary ductal dilatation. No definite hepatic abnormality identified.  No evidence of ascites. Electronically Signed   By: Marlaine Hind M.D.   On: 04/12/2022 12:44   DG Chest Port 1 View  Result Date: 04/12/2022 CLINICAL DATA:  Chest tube in place EXAM: PORTABLE CHEST 1 VIEW COMPARISON:  Chest x-ray April 12, 2022 FINDINGS: A left chest tube is in stable position. Persistent effusion with underlying opacity, similar to mildly worsened in the interval. No pneumothorax. No other changes. IMPRESSION: A left chest tube remains in place. Persistent effusion with underlying opacity, similar to mildly worsened in the interval. No pneumothorax. Electronically Signed   By: Dorise Bullion III M.D.   On: 04/12/2022 08:19   DG Chest Portable 1 View  Result Date: 04/12/2022 CLINICAL DATA:  Post chest tube placement EXAM: PORTABLE CHEST 1 VIEW COMPARISON:  Radiograph and CT 04/11/2022 FINDINGS: Decreased now small left pleural effusion after left basilar chest tube placement. Decreased left mid and lower lung atelectasis/consolidation. The right lung is clear. No pneumothorax. Stable cardiomediastinal silhouette. Aortic atherosclerotic calcification. IMPRESSION: Decreased now small left  pleural effusion after left basilar chest tube placement. Electronically Signed   By: Placido Sou M.D.   On: 04/12/2022 00:31   CT Angio Chest PE W and/or Wo Contrast  Result Date: 04/11/2022 CLINICAL DATA:  Confusion and coughing up blood EXAM: CT ANGIOGRAPHY CHEST WITH CONTRAST TECHNIQUE: Multidetector CT imaging of the chest was performed using the standard protocol during bolus administration of intravenous contrast. Multiplanar CT image reconstructions and MIPs were obtained to evaluate the vascular anatomy. RADIATION DOSE REDUCTION: This exam was performed according to the departmental dose-optimization program which includes automated exposure control, adjustment of the mA and/or kV according to patient size and/or use of iterative reconstruction technique. CONTRAST:  61m OMNIPAQUE IOHEXOL 350 MG/ML SOLN COMPARISON:  CTA chest 10/15/2019 and radiographs earlier today FINDINGS: Cardiovascular: Satisfactory opacification of the pulmonary arteries to the segmental level. No evidence of pulmonary embolism. Hazy hyperdensity layering along the periphery of the left lower lobe. This is indeterminate on single phase exam and could be due to heterogenous perfusion in the setting of pneumonia however is suspicious for active contrast extravasation possibly related to recent thoracentesis and/or AVM as this appears rounded peripherally (circa series 6/image 255-268). No definite feeding vessel is seen. Cardiomegaly. No pericardial effusion. Coronary artery and aortic atherosclerotic calcification. Mediastinum/Nodes: Shotty mediastinal and hilar lymph nodes have increased since 10/15/2019. The largest is a precarinal node measuring 1.3 cm (4/47). These are favored reactive. Unremarkable esophagus. Lungs/Pleura: Large left pleural effusion some of which is hyperdense posteriorly suspicious for hemothorax. Hazy ground-glass opacities in the superior segment left lower lobe and low-attenuation consolidation in the  inferior left lower lobe. These findings may be due to hemorrhage and/or pneumonia. Secretions/fluid within the left mainstem bronchus. The left lower lobe bronchi are not visualized. Secretions/fluid within the lingular bronchi. Atelectasis/consolidation in the lingula. Mild bronchial wall thickening and clustered centrilobular micro nodularity in the right lung. Part solid and subsolid nodule in the right upper lobe measuring 1.0 cm with the solid component measuring 3 mm (6/66). Upper Abdomen: Cholecystectomy.  No acute abnormality. Musculoskeletal: No chest wall abnormality. No acute osseous  findings. Review of the MIP images confirms the above findings. IMPRESSION: 1. Findings concerning for active hemorrhage in the left lower lobe possibly related to recent thoracentesis and/or AVM versus heterogenous perfusion in the setting of pneumonia. 2. Large left hemothorax. 3. Ground-glass and low-attenuation consolidation in the left lower lobe suspicious for pneumonia and/or alveolar hemorrhage. 4. Nonspecific secretions or blood within the left mainstem bronchus left lower lobe bronchi and lingula. 5. Part solid and subsolid nodule in the right upper lobe measuring 1.0 cm. Follow-up non-contrast CT recommended at 3-6 months to confirm persistence. If unchanged, and solid component remains <6 mm, annual CT is recommended until 5 years of stability has been established. If persistent these nodules should be considered highly suspicious if the solid component of the nodule is 6 mm or greater in size and enlarging. This recommendation follows the consensus statement: Guidelines for Management of Incidental Pulmonary Nodules Detected on CT Images: From the Fleischner Society 2017; Radiology 2017; 284:228-243. Critical Value/emergent results were called by telephone at the time of interpretation on 04/11/2022 at 10:34 pm to provider PHILLIP STAFFORD , who verbally acknowledged these results. Electronically Signed   By:  Placido Sou M.D.   On: 04/11/2022 22:50   DG Chest Portable 1 View  Result Date: 04/11/2022 CLINICAL DATA:  Shortness of breath and decreased left breath sounds EXAM: PORTABLE CHEST 1 VIEW COMPARISON:  Radiograph 04/02/2022 FINDINGS: Similar to increased large left pleural effusion and associated atelectasis/consolidation. The right lung is clear. No pneumothorax. Stable partially obscured cardiomediastinal silhouette. Aortic atherosclerotic calcification. IMPRESSION: Slightly increased large left pleural effusion and associated atelectasis/consolidation. Electronically Signed   By: Placido Sou M.D.   On: 04/11/2022 20:51   US Abdomen Limited RUQ (LIVER/GB)  Result Date: 04/08/2022 CLINICAL DATA:  Cirrhosis EXAM: ULTRASOUND ABDOMEN LIMITED RIGHT UPPER QUADRANT COMPARISON:  CT abdomen and pelvis April 07, 2011, renal ultrasound January 30, 2022 FINDINGS: Gallbladder: Surgically absent. Common bile duct: Diameter: 5.5 mm Liver: No focal lesion identified. Within normal limits in parenchymal echogenicity. Portal vein is patent on color Doppler imaging with normal direction of blood flow towards the liver. Other: None. IMPRESSION: No acute abnormality. Electronically Signed   By: Abelardo Diesel M.D.   On: 04/08/2022 09:12   DG Chest Port 1 View  Result Date: 04/02/2022 CLINICAL DATA:  status post thora EXAM: PORTABLE CHEST 1 VIEW COMPARISON:  Chest x-ray 03/24/2022. FINDINGS: Moderate to large left pleural effusion and overlying opacities. No visible pneumothorax. Partially obscured cardiomediastinal silhouette. No acute osseous abnormality. Polyarticular degenerative change. IMPRESSION: Moderate to large left pleural effusion and overlying opacities. No visible pneumothorax. Electronically Signed   By: Margaretha Sheffield M.D.   On: 04/02/2022 09:29   US THORACENTESIS ASP PLEURAL SPACE W/IMG GUIDE  Result Date: 04/02/2022 INDICATION: Shortness of breath with recurrent left pleural effusion.  EXAM: ULTRASOUND GUIDED LEFT THORACENTESIS MEDICATIONS: Local Lidocaine 1% only. COMPLICATIONS: None immediate. PROCEDURE: An ultrasound guided thoracentesis was thoroughly discussed with the patient and questions answered. The benefits, risks, alternatives and complications were also discussed. The patient understands and wishes to proceed with the procedure. Written consent was obtained. Ultrasound was performed to localize and mark an adequate pocket of fluid in the left chest. The area was then prepped and draped in the normal sterile fashion. 1% Lidocaine was used for local anesthesia. Under ultrasound guidance a 19 gauge, 10-cm, Yueh catheter was introduced. Thoracentesis was performed. The catheter was removed and a dressing applied. FINDINGS: A total of approximately 1.5 L of  clear yellow colored fluid was removed. Samples were sent to the laboratory as requested by the clinical team. IMPRESSION: Successful ultrasound guided left thoracentesis yielding 1.5 L of pleural fluid. This exam was performed by Tsosie Billing PA-C, and was supervised and interpreted by Dr. Annamaria Boots. Electronically Signed   By: Jerilynn Mages.  Shick M.D.   On: 04/02/2022 09:09   DG Chest Port 1 View  Result Date: 03/24/2022 CLINICAL DATA:  Post thoracentesis EXAM: PORTABLE CHEST 1 VIEW COMPARISON:  Chest XR, earlier same day and 03/13/2022. CT abdomen pelvis, 02/19/2022. FINDINGS: LEFT cardiac border is obscured. Aortic arch atherosclerosis. The RIGHT lung is well inflated. Interval decrease of prior large LEFT pleural effusion with small-to-moderate volume residual. LEFT basilar consolidative opacity, likely atelectasis. No pneumothorax. No interval osseous abnormality. IMPRESSION: 1. Post thoracentesis with volume residual LEFT pleural effusion. No pneumothorax. 2. Consolidative LEFT basilar opacity, likely atelectasis. Electronically Signed   By: Michaelle Birks M.D.   On: 03/24/2022 15:45   US THORACENTESIS ASP PLEURAL SPACE W/IMG  GUIDE  Result Date: 03/24/2022 INDICATION: Patient with complaint of dyspnea x3 months. During pulmonary visit today, patient was found to have large left pleural effusion. Patient was referred to IR for diagnostic and therapeutic LEFT thoracentesis. EXAM: ULTRASOUND GUIDED DIAGNOSTIC AND THERAPEUTIC LEFT THORACENTESIS MEDICATIONS: 10 mL 1 % lidocaine COMPLICATIONS: None immediate. PROCEDURE: An ultrasound guided thoracentesis was thoroughly discussed with the patient and questions answered. The benefits, risks, alternatives and complications were also discussed. The patient understands and wishes to proceed with the procedure. Written consent was obtained. Ultrasound was performed to localize and mark an adequate pocket of fluid in the LEFT chest. The area was then prepped and draped in the normal sterile fashion. 1% Lidocaine was used for local anesthesia. Under ultrasound guidance a 6 Fr Safe-T-Centesis catheter was introduced. Thoracentesis was performed. The catheter was removed and a dressing applied. FINDINGS: A total of approximately 1.6 L of clear, yellow fluid was removed. Samples were sent to the laboratory as requested by the clinical team. IMPRESSION: Successful diagnostic and therapeutic LEFT thoracentesis yielding 1.6 L of pleural fluid. Read by: Narda Rutherford, AGNP-BC Electronically Signed   By: Michaelle Birks M.D.   On: 03/24/2022 15:31    ECHO 03/2022 1. Left ventricular ejection fraction, by estimation, is 45 to 50%. The  left ventricle has mildly decreased function. The left ventricle  demonstrates global hypokinesis. The left ventricular internal cavity size  was moderately to severely dilated. There   is moderate concentric left ventricular hypertrophy. Left ventricular  diastolic parameters are consistent with Grade II diastolic dysfunction  (pseudonormalization).   2. Right ventricular systolic function is mildly reduced. The right  ventricular size is moderately enlarged. Mildly  increased right  ventricular wall thickness.   3. Left atrial size was mildly dilated.   4. Right atrial size was mildly dilated.   5. Moderate pleural effusion in the left lateral region.   6. The mitral valve was not well visualized. Trivial mitral valve  regurgitation.   7. The aortic valve is grossly normal. Aortic valve regurgitation is not  visualized. Aortic valve sclerosis is present, with no evidence of aortic  valve stenosis.   Exercise myoview 04/2021 Impression  Normal treadmill EKG without evidence of ischemia or arrhythmia Mild global LV systolic dysfunction with ejection fraction of 40% Mild fixed inferior myocardial fusion defect consistent with previous infarct scar but more likely diaphragmatic attenuation Clinical correlation advised  TELEMETRY reviewed by me (LT) 04/16/2022 : NSR 70s-80s RBBB  EKG reviewed by me: Sinus tach, RBBB, nonspec ST changes  Data reviewed by me (LT) 04/16/2022: hospitalist, pulm, nephro progress notes, cbc, bmp, trops, bnp, vitals, tele    Principal Problem:   Influenza A Active Problems:   Iron deficiency anemia   Diabetes mellitus type 2 in obese (HCC)   CAD (coronary artery disease)   Essential (primary) hypertension   Hyperlipidemia, mixed   Obesity, Class III, BMI 40-49.9 (morbid obesity) ([place])   Chronic kidney disease due to type 2 diabetes mellitus (HCC)   Edema of lower extremity   Acute on chronic systolic CHF (congestive heart failure) (HCC)   Acute renal failure superimposed on stage 2 chronic kidney disease (HCC)   Recurrent left pleural effusion   Acute hypoxemic respiratory failure (HCC)   Chronic membranous glomerulonephritis    ASSESSMENT AND PLAN:  [name] is a 4yoM with a PMH of 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 Positive for influenza A. Febrile and tachycardic on admission, CTA chest with large left effusion s/p pigtail chest tube in the ED on 1/28. Hospital course complicated by [facility], AKI following diuresis, and anemia requiring 1 unit pRBCs & IV iron. Remains on supplemental O2 and a lasix infusion, chest tube with significant output.   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 On lasix infusion as below.  # acute on chronic HFmrEF (45-50%, global hypo, g2dd 03/2022) Clinically hypervolemic, although improving. 3+ edema in lower extremities on admission, still with supplemental oxygen requirement.  -agree with lasix gtt at '4mg'$ /hr per nephrology -restart GDMT as BP, renal function allows. Relative hypotension precluding this currently - on coreg 6.'25mg'$  BID at home   # CAD without chest pain, hx PCI to mid LAD (2001) # demand ischemia  Chest pain free, EKGs showing NSR, RBBB, nonspecific T wave changes. Troponins trended 738, 747, 1039, most consistent with demand/supply mismatch and not ACS.  -once Hgb stable, restart aspirin '81mg'$  daily -continue atorvastatin '80mg'$  daily   # acute on chronic anemia S/p 1 unit PRBCS 1/30, IV iron  -hgb with drift from 7.7--7.2. continue to monitor closely   This patient's plan of care was discussed and created with Dr. Saralyn Pilar and he is in agreement.  Signed: Tristan Schroeder , PA-C 04/16/2022, 8:29 AM [facility] Card[facility]

## 2022-04-16 NOTE — Progress Notes (Signed)
PULMONOLOGY         Date: 04/16/2022,   MRN# [id number] [name] 02/11/1951     AdmissionWeight: (!) 138.7 kg                 CurrentWeight: (!) 138.7 kg  Referring provider: Dr Roosevelt Locks   CHIEF COMPLAINT:   Parapneumonic effusion s/p chest tube placement    HISTORY OF PRESENT ILLNESS   This is a pleasant 72 year old male with severe morbid obesity chronic 3+ weeping lower extremity edema due to CKD with Membranous glomerulonephritis disease and recent evaluation with nephrology for rituximab infusion, history of CHF with reduced EF at 40%, osteoarthritis, diabetes, history of melanoma, neuropathy, spinal stenosis with chief complaint of shortness of breath and chest discomfort.  Patient is well-known to our pulmonology service with [facility] clinic we recently saw him for acute on chronic hypoxemic respiratory failure noted to have left-sided large effusion and right-sided mild to moderate effusion.  Status postthoracentesis x 2 with over 1 L drained.  This is a third time that he is shown to have a large pleural effusion in stent 3 months.  He did receive pleural drain upon arrival to the ER with 1500 cc serosanguineous return overnight and active drainage this a.m. and up to 150 cc before lunchtime.  On interview and physical exam patient reports slight improvement mild chest discomfort at pigtail insertion site.  Chest tube management displays no airleak upon forced expiratory maneuver.   04/14/22- patient examined at bedside, s/p repeat CBC with severe anemia requiring PRBC transfusion. S/p [place] abd no ascites. Chest tube in place on left , persistent and recurrent effusion present on CXR 1/28.  Will repeat CXR again today.  On Lasix gtt with 600cc urine overnight. Development of transient hypotension with req for midodrine.   04/15/22-  patient reports nausea. He is on oxycodone.  He appears less swollen today. Overall he is net 1.3L negative and has active fluid per Chest  tube. He needs to start using incentive spirometer today.    04/16/22- patient had 150cc this am per chest tube.  Patient reports improvement clinically.  Continue chest tube for now.   PAST MEDICAL HISTORY   Past Medical History:  Diagnosis Date   Arthritis    CHF (congestive heart failure), NYHA class II, acute on chronic, systolic (HCC)    EF 76% 03/9507   Coronary artery disease    [facility]   Diabetes mellitus without complication ([place])    Dysplastic nevus 08/26/2020   L flank, moderate atypia   Hyperlipidemia    Hypertension    Melanoma ([place]) 08/20/2015   Right neck. Superficial spreading, arising in nevus. Tumor thickness 1.46m, Anatomic level IV   [place]22[place]   R lateral neck ant to scar, exc 05/22/20   Numbness in right leg    due to back   Skin cancer    removed l arm   Spinal stenosis    Stented coronary artery 2000   X 1 STENT   Swelling of both lower extremities      SURGICAL HISTORY   Past Surgical History:  Procedure Laterality Date   CATARACT EXTRACTION W/PHACO Right 05/03/2019   Procedure: CATARACT EXTRACTION PHACO AND INTRAOCULAR LENS PLACEMENT (IOC) RIGHT DIABETIC 6.44 00:58.4 11.0%;  Surgeon: BLeandrew Koyanagi MD;  [facility] [facility]  Service: Ophthalmology;  Laterality: Right;  DIABETIC   CATARACT EXTRACTION W/PHACO Left 06/28/2019   Procedure: CATARACT EXTRACTION  PHACO AND INTRAOCULAR LENS PLACEMENT (IOC) LEFT DIABETIC 7.99  01:13.4  10.9% ;  Surgeon: Leandrew Koyanagi, MD;  Location: [place];  Service: Ophthalmology;  Laterality: Left;  Diabetic - insulin and oral meds   CHOLECYSTECTOMY     JOINT REPLACEMENT     RT KNEE   LUMBAR LAMINECTOMY/DECOMPRESSION MICRODISCECTOMY  04/13/2012   Procedure: LUMBAR LAMINECTOMY/DECOMPRESSION MICRODISCECTOMY 1 LEVEL;  Surgeon: Johnn Hai, MD;  Location: [facility];  Service: Orthopedics;  Laterality: Bilateral;  L4-L5   UVULECTOMY  1990's      FAMILY HISTORY   History reviewed. No pertinent family history.   SOCIAL HISTORY   Social History   Tobacco Use   Smoking status: Never   Smokeless tobacco: Never  Vaping Use   Vaping Use: Never used  Substance Use Topics   Alcohol use: Yes    Comment: rare   Drug use: No     MEDICATIONS    Home Medication:     Current Medication:  Current Facility-Administered Medications:    0.9 %  sodium chloride infusion (Manually program via Guardrails IV Fluids), , Intravenous, Once, Sharen Hones, MD   acetaminophen (TYLENOL) tablet 650 mg, 650 mg, Oral, Q6H PRN **OR** acetaminophen (TYLENOL) suppository 650 mg, 650 mg, Rectal, Q6H PRN, Sid Falcon, MD   albumin human 25 % solution 25 g, 25 g, Intravenous, Q12H, Lateef, Munsoor, MD, Last Rate: 60 mL/hr at 04/16/22 0925, 25 g at 04/16/22 6578   ascorbic acid (VITAMIN C) tablet 500 mg, 500 mg, Oral, Daily, Val Riles, MD, 500 mg at 04/16/22 0931   atorvastatin (LIPITOR) tablet 80 mg, 80 mg, Oral, Daily, Gilles Chiquito B, MD, 80 mg at 04/16/22 0931   enoxaparin (LOVENOX) injection 70 mg, 0.5 mg/kg, Subcutaneous, Q24H, Gilles Chiquito B, MD, 70 mg at 04/16/22 0931   furosemide (LASIX) 200 mg in dextrose 5 % 100 mL (2 mg/mL) infusion, 4 mg/hr, Intravenous, Continuous, Lateef, Munsoor, MD, Last Rate: 2 mL/hr at 04/15/22 2221, 4 mg/hr at 04/15/22 2221   guaiFENesin-dextromethorphan (ROBITUSSIN DM) 100-10 MG/5ML syrup 5 mL, 5 mL, Oral, Q4H PRN, Gilles Chiquito B, MD, 5 mL at 04/13/22 2154   HYDROmorphone (DILAUDID) injection 0.5-1 mg, 0.5-1 mg, Intravenous, Q2H PRN, Gilles Chiquito B, MD, 1 mg at 04/14/22 2136   insulin aspart (novoLOG) injection 0-15 Units, 0-15 Units, Subcutaneous, TID WC, Gilles Chiquito B, MD, 3 Units at 04/16/22 1239   insulin aspart (novoLOG) injection 0-5 Units, 0-5 Units, Subcutaneous, QHS, Gilles Chiquito B, MD, 2 Units at 04/13/22 2153   insulin glargine-yfgn (SEMGLEE) injection 25 Units, 25 Units, Subcutaneous,  QHS, Sharen Hones, MD, 25 Units at 04/15/22 2229   iron polysaccharides (NIFEREX) capsule 150 mg, 150 mg, Oral, Daily, Val Riles, MD, 150 mg at 04/16/22 0931   methocarbamol (ROBAXIN) 500 mg in dextrose 5 % 50 mL IVPB, 500 mg, Intravenous, Q6H PRN, Sid Falcon, MD   midodrine (PROAMATINE) tablet 5 mg, 5 mg, Oral, TID WC, Singh, Harmeet, MD, 5 mg at 04/16/22 1238   ondansetron (ZOFRAN) tablet 4 mg, 4 mg, Oral, Q6H PRN **OR** ondansetron (ZOFRAN) injection 4 mg, 4 mg, Intravenous, Q6H PRN, Gilles Chiquito B, MD, 4 mg at 04/15/22 1123   oxyCODONE (Oxy IR/ROXICODONE) immediate release tablet 5 mg, 5 mg, Oral, Q4H PRN, Gilles Chiquito B, MD, 5 mg at 04/15/22 1401   sodium chloride flush (NS) 0.9 % injection 3 mL, 3 mL, Intravenous, Q12H, Gilles Chiquito B, MD, 3 mL at 04/16/22 0932  ALLERGIES   Carvedilol, Metoprolol, Toprol xl [metoprolol tartrate], Buprenorphine, Buprenorphine hcl, Morphine, Morphine and related, Triamcinolone, and Triamcinolone acetonide     REVIEW OF SYSTEMS    Review of Systems:  Gen:  Denies  fever, sweats, chills weigh loss  HEENT: Denies blurred vision, double vision, ear pain, eye pain, hearing loss, nose bleeds, sore throat Cardiac:  No dizziness, chest pain or heaviness, chest tightness,edema Resp:   reports dyspnea chronically  Gi: Denies swallowing difficulty, stomach pain, nausea or vomiting, diarrhea, constipation, bowel incontinence Gu:  Denies bladder incontinence, burning urine Ext:   Denies Joint pain, stiffness or swelling Skin: Denies  skin rash, easy bruising or bleeding or hives Endoc:  Denies polyuria, polydipsia , polyphagia or weight change Psych:   Denies depression, insomnia or hallucinations   Other:  All other systems negative   VS: BP (!) 123/51 (BP Location: Right Arm)   Pulse 76   Temp 98 F (36.7 C)   Resp 16   Ht 6' (1.829 m)   Wt (!) 138.7 kg   SpO2 99%   BMI 41.47 kg/m      PHYSICAL EXAM    GENERAL:NAD, no  fevers, chills, no weakness no fatigue HEAD: Normocephalic, atraumatic.  EYES: Pupils equal, round, reactive to light. Extraocular muscles intact. No scleral icterus.  MOUTH: Moist mucosal membrane. Dentition intact. No abscess noted.  EAR, NOSE, THROAT: Clear without exudates. No external lesions.  NECK: Supple. No thyromegaly. No nodules. No JVD.  PULMONARY: decreased breath sounds with mild rhonchi worse at bases bilaterally.  CARDIOVASCULAR: S1 and S2. Regular rate and rhythm. No murmurs, rubs, or gallops. No edema. Pedal pulses 2+ bilaterally.  GASTROINTESTINAL: Soft, nontender, nondistended. No masses. Positive bowel sounds. No hepatosplenomegaly.  MUSCULOSKELETAL: No swelling, clubbing, or edema. Range of motion full in all extremities.  NEUROLOGIC: Cranial nerves II through XII are intact. No gross focal neurological deficits. Sensation intact. Reflexes intact.  SKIN: No ulceration, lesions, rashes, or cyanosis. Skin warm and dry. Turgor intact.  PSYCHIATRIC: Mood, affect within normal limits. The patient is awake, alert and oriented x 3. Insight, judgment intact.       IMAGING     ASSESSMENT/PLAN   Acute on chronic hypoxemic respiratory failure Present on admission due to influenza A infection with recurrent left pleural effusion and associated compressive atelectasis -Patient's evaluated by nephrology and is status post Lasix drip as well as pigtail chest tube on the left with 1500 cc serosanguineous fluid overnight and active drain during examination today -Is no air leak upon forced aspiratory maneuver, chest tube is not clogged and fluid is freely flowing. -Fluid was previously tested and was noted to be lymphocyte predominant fluid and became exudative upon initiation of diuretics which is known to happen.  Does not appear to be empyema and I do not think he is a very good candidate for intrapleural thrombolytics.  Patient may be heading towards dialysis but if not he may  also benefit from thoracic surgery evaluation and possible pleurodesis.  Discussed this care plan with son and patient at bedside as well as attending physician Dr. Roosevelt Locks    Thank you for allowing me to participate in the care of this patient.   Patient/Family are satisfied with care plan and all questions have been answered.    Provider disclosure: Patient with at least one acute or chronic illness or injury that poses a threat to life or bodily function and is being managed actively during this encounter.  All of  the below services have been performed independently by signing provider:  review of prior documentation from internal and or external health records.  Review of previous and current lab results.  Interview and comprehensive assessment during patient visit today. Review of current and previous chest radiographs/CT scans. Discussion of management and test interpretation with health care team and patient/family.   This document was prepared using Dragon voice recognition software and may include unintentional dictation errors.     Ottie Glazier, M.D.  Division of Pulmonary & Critical Care Medicine

## 2022-04-16 NOTE — Progress Notes (Signed)
[facility]  ROUNDING NOTE   Subjective:  Patient well-known to [place] as we follow him for idiopathic membranous glomerulonephritis with nephrotic syndrome, chronic kidney disease stage II with diabetes mellitus type 2, proteinuria, and lower extremity edema.  Patient came in with significant shortness of breath.  Found to be positive for the flu.  Has a left-sided chest tube in place.  Baseline creatinine 1.15 with EGFR of 67.  Most recent urine protein to creatinine ratio was down to 4.9.  He was to be evaluated by hematology for treatment with rituximab.  Patient sitting up in bed, no family at bedside Alert and oriented Denies shortness of breath. Remains on 2 L nasal cannula Lower extremity edema remains however improved.  Objective:  Vital signs in last 24 hours:  Temp:  [97.7 F (36.5 C)-98.1 F (36.7 C)] 98 F (36.7 C) (02/01 1138) Pulse Rate:  [76-80] 76 (02/01 1138) Resp:  [16-20] 16 (02/01 1138) BP: (105-126)/(50-63) 123/51 (02/01 1138) SpO2:  [98 %-100 %] 99 % (02/01 1138) Weight:  [128.7 kg] 128.7 kg (02/01 1429)  Weight change:  Filed Weights   04/11/22 2039 04/16/22 1429  Weight: (!) 138.7 kg 128.7 kg    Intake/Output: I/O last 3 completed shifts: In: 2166 [P.O.:1800; Blood:366] Out: 1500 [Urine:1050; Chest Tube:450]   Intake/Output this shift:  Total I/O In: -  Out: 340 [Urine:200; Chest Tube:140]  Physical Exam: General: No acute distress  Head: Normocephalic, atraumatic. Moist oral mucosal membranes  Lungs:  Bibasilar rales, normal effort  Heart: S1S2 no rubs  Abdomen:  Soft, nontender, bowel sounds present  Extremities: 2+ brawny peripheral edema.  Neurologic: Awake, alert, following commands  Skin: No acute rash    Basic Metabolic Panel: Recent Labs  Lab 04/12/22 0500 04/13/22 0520 04/14/22 0506 04/15/22 0235 04/16/22 0445  NA 133* 132* 132* 135 137  K 4.1 4.0 3.8 3.8 3.5  CL 98 97* 100 99 101  CO2 '24 24 26 29 26  '$ GLUCOSE  224* 253* 141* 148* 114*  BUN 69* 76* 82* 83* 83*  CREATININE 1.92* 2.03* 2.16* 2.26* 2.08*  CALCIUM 7.9* 7.6* 7.4* 7.6* 7.7*  MG  --  2.2 2.4 2.4 2.5*  PHOS  --   --   --   --  4.6     Liver Function Tests: Recent Labs  Lab 04/11/22 2033 04/12/22 0500 04/15/22 0235  AST '23 24 20  '$ ALT '15 14 11  '$ ALKPHOS 72 60 60  BILITOT 1.4* 1.0 1.2  PROT 5.9* 5.8* 5.6*  ALBUMIN 2.5* 2.4* 2.9*    No results for input(s): "LIPASE", "AMYLASE" in the last 168 hours. No results for input(s): "AMMONIA" in the last 168 hours.  CBC: Recent Labs  Lab 04/11/22 2033 04/12/22 0500 04/13/22 0520 04/14/22 0506 04/15/22 0235 04/16/22 0445  WBC 5.3 5.5 4.0 3.9* 5.1 4.1  NEUTROABS 4.5  --   --   --   --   --   HGB 8.1* 8.0* 7.3* 6.9* 7.7* 7.2*  HCT 25.8* 26.0* 23.5* 21.4* 24.3* 22.0*  MCV 89.9 92.5 90.7 88.4 88.4 88.4  PLT 193 189 195 180 204 196     Cardiac Enzymes: No results for input(s): "CKTOTAL", "CKMB", "CKMBINDEX", "TROPONINI" in the last 168 hours.  BNP: Invalid input(s): "POCBNP"  CBG: Recent Labs  Lab 04/15/22 1149 04/15/22 1616 04/15/22 2205 04/16/22 0808 04/16/22 1135  GLUCAP 200* 182* 182* 100* 167*     Microbiology: Results for orders placed or performed during the hospital  encounter of 04/11/22  Resp panel by RT-PCR (RSV, Flu A&B, Covid) Anterior Nasal Swab     Status: Abnormal   Collection Time: 04/11/22  8:34 PM   Specimen: Anterior Nasal Swab  Result Value Ref Range Status   SARS Coronavirus 2 by RT PCR NEGATIVE NEGATIVE Final    Comment: (NOTE) SARS-CoV-2 target nucleic acids are NOT DETECTED.  The SARS-CoV-2 RNA is generally detectable in upper respiratory specimens during the acute phase of infection. The lowest concentration of SARS-CoV-2 viral copies this assay can detect is 138 copies/mL. A negative result does not preclude SARS-Cov-2 infection and should not be used as the sole basis for treatment or other patient management decisions. A negative  result may occur with  improper specimen collection/handling, submission of specimen other than nasopharyngeal swab, presence of viral mutation(s) within the areas targeted by this assay, and inadequate number of viral copies(<138 copies/mL). A negative result must be combined with clinical observations, patient history, and epidemiological information. The expected result is Negative.  Fact Sheet for Patients:  EntrepreneurPulse.com.au  Fact Sheet for Healthcare Providers:  IncredibleEmployment.be  This test is no t yet approved or cleared by the [place] FDA and  has been authorized for detection and/or diagnosis of SARS-CoV-2 by FDA under an Emergency Use Authorization (EUA). This EUA will remain  in effect (meaning this test can be used) for the duration of the COVID-19 declaration under Section 564(b)(1) of the Act, 21 U.S.C.section 360bbb-3(b)(1), unless the authorization is terminated  or revoked sooner.       Influenza A by PCR POSITIVE (A) NEGATIVE Final   Influenza B by PCR NEGATIVE NEGATIVE Final    Comment: (NOTE) The Xpert Xpress SARS-CoV-2/FLU/RSV plus assay is intended as an aid in the diagnosis of influenza from Nasopharyngeal swab specimens and should not be used as a sole basis for treatment. Nasal washings and aspirates are unacceptable for Xpert Xpress SARS-CoV-2/FLU/RSV testing.  Fact Sheet for Patients: EntrepreneurPulse.com.au  Fact Sheet for Healthcare Providers: IncredibleEmployment.be  This test is not yet approved or cleared by the [place] FDA and has been authorized for detection and/or diagnosis of SARS-CoV-2 by FDA under an Emergency Use Authorization (EUA). This EUA will remain in effect (meaning this test can be used) for the duration of the COVID-19 declaration under Section 564(b)(1) of the Act, 21 U.S.C. section 360bbb-3(b)(1), unless the authorization is  terminated or revoked.     Resp Syncytial Virus by PCR NEGATIVE NEGATIVE Final    Comment: (NOTE) Fact Sheet for Patients: EntrepreneurPulse.com.au  Fact Sheet for Healthcare Providers: IncredibleEmployment.be  This test is not yet approved or cleared by the [place] FDA and has been authorized for detection and/or diagnosis of SARS-CoV-2 by FDA under an Emergency Use Authorization (EUA). This EUA will remain in effect (meaning this test can be used) for the duration of the COVID-19 declaration under Section 564(b)(1) of the Act, 21 U.S.C. section 360bbb-3(b)(1), unless the authorization is terminated or revoked.  Performed at [facility], [address] [place], [place] [address]   Culture, blood (routine x 2)     Status: None   Collection Time: 04/11/22  9:59 PM   Specimen: BLOOD LEFT ARM  Result Value Ref Range Status   Specimen Description BLOOD LEFT ARM  Final   Special Requests   Final    BOTTLES DRAWN AEROBIC AND ANAEROBIC Blood Culture adequate volume   Culture   Final    NO GROWTH 5 DAYS Performed at [facility]  [facility], [address] [place], [place] [address]    Report Status 04/16/2022 FINAL  Final  Culture, blood (routine x 2)     Status: None   Collection Time: 04/11/22 10:01 PM   Specimen: BLOOD LEFT HAND  Result Value Ref Range Status   Specimen Description BLOOD LEFT HAND  Final   Special Requests   Final    BOTTLES DRAWN AEROBIC AND ANAEROBIC Blood Culture results may not be optimal due to an inadequate volume of blood received in culture bottles   Culture   Final    NO GROWTH 5 DAYS Performed at [facility], [address] [place], [place] [address]    Report Status 04/16/2022 FINAL  Final    Coagulation Studies: No results for input(s): "LABPROT", "INR" in the last 72 hours.  Urinalysis: No results for input(s): "COLORURINE", "LABSPEC", "PHURINE", "GLUCOSEU", "HGBUR",  "BILIRUBINUR", "KETONESUR", "PROTEINUR", "UROBILINOGEN", "NITRITE", "LEUKOCYTESUR" in the last 72 hours.  Invalid input(s): "APPERANCEUR"    Imaging: DG Chest Port 1 View  Result Date: 04/15/2022 CLINICAL DATA:  Left pleural effusion. EXAM: PORTABLE CHEST 1 VIEW COMPARISON:  April 14, 2022. FINDINGS: Stable cardiomegaly. Stable left basilar atelectasis or infiltrate is noted with associated pleural effusion. Minimal right basilar subsegmental atelectasis is noted. Bony thorax is unremarkable. IMPRESSION: Stable left basilar atelectasis or infiltrate is noted with associated pleural effusion. Minimal right basilar subsegmental atelectasis. Electronically Signed   By: Marijo Conception M.D.   On: 04/15/2022 14:49     Medications:    albumin human 25 g (04/16/22 0925)   furosemide (LASIX) 200 mg in dextrose 5 % 100 mL (2 mg/mL) infusion 4 mg/hr (04/15/22 2221)   methocarbamol (ROBAXIN) IV      sodium chloride   Intravenous Once   vitamin C  500 mg Oral Daily   atorvastatin  80 mg Oral Daily   enoxaparin (LOVENOX) injection  0.5 mg/kg Subcutaneous Q24H   insulin aspart  0-15 Units Subcutaneous TID WC   insulin aspart  0-5 Units Subcutaneous QHS   insulin glargine-yfgn  25 Units Subcutaneous QHS   iron polysaccharides  150 mg Oral Daily   midodrine  5 mg Oral TID WC   sodium chloride flush  3 mL Intravenous Q12H   acetaminophen **OR** acetaminophen, guaiFENesin-dextromethorphan, HYDROmorphone (DILAUDID) injection, methocarbamol (ROBAXIN) IV, ondansetron **OR** ondansetron (ZOFRAN) IV, oxyCODONE  Assessment/ Plan:  72 y.o. male with past medical history of diabetes mellitus type 2, morbid obesity, hypertension, coronary artery disease status post stent placement, cardiomyopathy, low testosterone, hyperlipidemia, history of first left toe osteomyelitis with amputation, hydronephrosis, osteoarthritis of the right hip, history of idiopathic membranous glomerulonephritis who presents with  increasing shortness of breath.  1.  Idiopathic membranous glomerulonephritis/ Acute kidney injury on chronic kidney disease stage II/proteinuria most recent urine protein to creatinine ratio 4.9.  Patient was in the process of being evaluated for rituximab treatment of his idiopathic membranous glomerulonephritis. -Hold off on administering rituximab at this time. -Creatinine remains elevated from baseline. Adequate urine output noted.   2. Lower extremity edema. Continue Lasix 4 mg/h and albumin for now.   3.  Anemia of chronic kidney disease.  Hemoglobin decreased 7.2. Denies signs of bleeding.   4.  Acute on chronic combined systolic and diastolic CHF, acute hypoxemic respiratory failure, volume overload, coronary artery disease. Volume management with Lasix drip as above. Recurrent left-sided pleural effusion secondary to nephrotic syndrome, third spacing of fluid.  Currently managed with left chest tube.  5.  Influenza A On Tamiflu.    LOS: [place] 2/1/20242:41 PM

## 2022-04-17 ENCOUNTER — Ambulatory Visit: Payer: Medicare HMO

## 2022-04-17 DIAGNOSIS — J101 Influenza due to other identified influenza virus with other respiratory manifestations: Secondary | ICD-10-CM | POA: Diagnosis not present

## 2022-04-17 LAB — BASIC METABOLIC PANEL
Anion gap: 6 (ref 5–15)
BUN: 77 mg/dL — ABNORMAL HIGH (ref 8–23)
CO2: 29 mmol/L (ref 22–32)
Calcium: 7.8 mg/dL — ABNORMAL LOW (ref 8.9–10.3)
Chloride: 102 mmol/L (ref 98–111)
Creatinine, Ser: 2.03 mg/dL — ABNORMAL HIGH (ref 0.61–1.24)
GFR, Estimated: 34 mL/min — ABNORMAL LOW (ref >60)
Glucose, Bld: 176 mg/dL — ABNORMAL HIGH (ref 70–99)
Potassium: 4 mmol/L (ref 3.5–5.1)
Sodium: 137 mmol/L (ref 135–145)

## 2022-04-17 LAB — MISC LABCORP TEST (SEND OUT)
Labcorp test code: 81950
Labcorp test code: 81950

## 2022-04-17 LAB — CBC
HCT: 22.4 % — ABNORMAL LOW (ref 39.0–52.0)
Hemoglobin: 7.2 g/dL — ABNORMAL LOW (ref 13.0–17.0)
MCH: 28.8 pg (ref 26.0–34.0)
MCHC: 32.1 g/dL (ref 30.0–36.0)
MCV: 89.6 fL (ref 80.0–100.0)
Platelets: 182 10*3/uL (ref 150–400)
RBC: 2.5 MIL/uL — ABNORMAL LOW (ref 4.22–5.81)
RDW: 16.2 % — ABNORMAL HIGH (ref 11.5–15.5)
WBC: 6 10*3/uL (ref 4.0–10.5)
nRBC: 0 % (ref 0.0–0.2)

## 2022-04-17 LAB — GLUCOSE, CAPILLARY
Glucose-Capillary: 151 mg/dL — ABNORMAL HIGH (ref 70–99)
Glucose-Capillary: 185 mg/dL — ABNORMAL HIGH (ref 70–99)
Glucose-Capillary: 232 mg/dL — ABNORMAL HIGH (ref 70–99)
Glucose-Capillary: 275 mg/dL — ABNORMAL HIGH (ref 70–99)
Glucose-Capillary: 233 mg/dL — ABNORMAL HIGH (ref 70–99)

## 2022-04-17 LAB — MAGNESIUM: Magnesium: 2.3 mg/dL (ref 1.7–2.4)

## 2022-04-17 LAB — PHOSPHORUS: Phosphorus: 3.6 mg/dL (ref 2.5–4.6)

## 2022-04-17 MED ORDER — SPIRONOLACTONE 25 MG PO TABS
50.0000 mg | ORAL_TABLET | Freq: Every day | ORAL | Status: DC
  Administered 2022-04-17 – 2022-04-26 (×10): 50 mg via ORAL
  Filled 2022-04-17 (×10): qty 2

## 2022-04-17 MED ORDER — VITAMIN D (ERGOCALCIFEROL) 1.25 MG (50000 UNIT) PO CAPS
50000.0000 [IU] | ORAL_CAPSULE | ORAL | Status: DC
  Administered 2022-04-17 – 2022-04-24 (×2): 50000 [IU] via ORAL
  Filled 2022-04-17 (×2): qty 1

## 2022-04-17 MED ORDER — CHLORHEXIDINE GLUCONATE CLOTH 2 % EX PADS
6.0000 | MEDICATED_PAD | Freq: Every day | CUTANEOUS | Status: DC
  Administered 2022-04-17 – 2022-04-26 (×9): 6 via TOPICAL

## 2022-04-17 MED ORDER — TAMSULOSIN HCL 0.4 MG PO CAPS
0.4000 mg | ORAL_CAPSULE | Freq: Every day | ORAL | Status: DC
  Administered 2022-04-17 – 2022-04-26 (×10): 0.4 mg via ORAL
  Filled 2022-04-17 (×10): qty 1

## 2022-04-17 MED ORDER — TORSEMIDE 20 MG PO TABS
100.0000 mg | ORAL_TABLET | Freq: Every day | ORAL | Status: DC
  Administered 2022-04-17 – 2022-04-26 (×10): 100 mg via ORAL
  Filled 2022-04-17 (×10): qty 5

## 2022-04-17 NOTE — Progress Notes (Signed)
PULMONOLOGY         Date: 04/17/2022,   MRN# [id number] [name] 12-19-1950     AdmissionWeight: (!) 138.7 kg                 CurrentWeight: 127.3 kg  Referring provider: Dr Roosevelt Locks   CHIEF COMPLAINT:   Parapneumonic effusion s/p chest tube placement    HISTORY OF PRESENT ILLNESS   This is a pleasant 72 year old male with severe morbid obesity chronic 3+ weeping lower extremity edema due to CKD with Membranous glomerulonephritis disease and recent evaluation with nephrology for rituximab infusion, history of CHF with reduced EF at 40%, osteoarthritis, diabetes, history of melanoma, neuropathy, spinal stenosis with chief complaint of shortness of breath and chest discomfort.  Patient is well-known to our pulmonology service with [facility] clinic we recently saw him for acute on chronic hypoxemic respiratory failure noted to have left-sided large effusion and right-sided mild to moderate effusion.  Status postthoracentesis x 2 with over 1 L drained.  This is a third time that he is shown to have a large pleural effusion in stent 3 months.  He did receive pleural drain upon arrival to the ER with 1500 cc serosanguineous return overnight and active drainage this a.m. and up to 150 cc before lunchtime.  On interview and physical exam patient reports slight improvement mild chest discomfort at pigtail insertion site.  Chest tube management displays no airleak upon forced expiratory maneuver.   04/14/22- patient examined at bedside, s/p repeat CBC with severe anemia requiring PRBC transfusion. S/p [place] abd no ascites. Chest tube in place on left , persistent and recurrent effusion present on CXR 1/28.  Will repeat CXR again today.  On Lasix gtt with 600cc urine overnight. Development of transient hypotension with req for midodrine.   04/15/22-  patient reports nausea. He is on oxycodone.  He appears less swollen today. Overall he is net 1.3L negative and has active fluid per Chest  tube. He needs to start using incentive spirometer today.    04/16/22- patient had 150cc this am per chest tube.  Patient reports improvement clinically.  Continue chest tube for now.   04/17/22- patient on 2L/min nasal canula.  He still has straw colored phlegm returning from chest tube.  He coughed up phlegm with blood/rusty colored. He had obstructiive urophathy and had foley placed feels a lot better now. He is constipated but did have BM today shares he is much improved.   PAST MEDICAL HISTORY   Past Medical History:  Diagnosis Date   Arthritis    CHF (congestive heart failure), NYHA class II, acute on chronic, systolic (HCC)    EF 96% 09/5914   Coronary artery disease    [facility]   Diabetes mellitus without complication ([place])    Dysplastic nevus 08/26/2020   L flank, moderate atypia   Hyperlipidemia    Hypertension    Melanoma ([place]) 08/20/2015   Right neck. Superficial spreading, arising in nevus. Tumor thickness 1.28m, Anatomic level IV   [place] 05/22/2020   R lateral neck ant to scar, exc 05/22/20   Numbness in right leg    due to back   Skin cancer    removed l arm   Spinal stenosis    Stented coronary artery 2000   X 1 STENT   Swelling of both lower extremities      SURGICAL HISTORY   Past Surgical History:  Procedure Laterality Date  CATARACT EXTRACTION W/PHACO Right 05/03/2019   Procedure: CATARACT EXTRACTION PHACO AND INTRAOCULAR LENS PLACEMENT (IOC) RIGHT DIABETIC 6.44 00:58.4 11.0%;  Surgeon: Leandrew Koyanagi, MD;  Location: [facility];  Service: Ophthalmology;  Laterality: Right;  DIABETIC   CATARACT EXTRACTION W/PHACO Left 06/28/2019   Procedure: CATARACT EXTRACTION PHACO AND INTRAOCULAR LENS PLACEMENT (IOC) LEFT DIABETIC 7.99  01:13.4  10.9% ;  Surgeon: Leandrew Koyanagi, MD;  Location: [facility];  Service: Ophthalmology;  Laterality: Left;  Diabetic - insulin and oral meds   CHOLECYSTECTOMY      JOINT REPLACEMENT     RT KNEE   LUMBAR LAMINECTOMY/DECOMPRESSION MICRODISCECTOMY  04/13/2012   Procedure: LUMBAR LAMINECTOMY/DECOMPRESSION MICRODISCECTOMY 1 LEVEL;  Surgeon: Johnn Hai, MD;  Location: [facility];  Service: Orthopedics;  Laterality: Bilateral;  L4-L5   UVULECTOMY  1990's     FAMILY HISTORY   History reviewed. No pertinent family history.   SOCIAL HISTORY   Social History   Tobacco Use   Smoking status: Never   Smokeless tobacco: Never  Vaping Use   Vaping Use: Never used  Substance Use Topics   Alcohol use: Yes    Comment: rare   Drug use: No     MEDICATIONS    Home Medication:     Current Medication:  Current Facility-Administered Medications:    0.9 %  sodium chloride infusion (Manually program via Guardrails IV Fluids), , Intravenous, Once, Sharen Hones, MD   acetaminophen (TYLENOL) tablet 650 mg, 650 mg, Oral, Q6H PRN **OR** acetaminophen (TYLENOL) suppository 650 mg, 650 mg, Rectal, Q6H PRN, Sid Falcon, MD   ascorbic acid (VITAMIN C) tablet 500 mg, 500 mg, Oral, Daily, Val Riles, MD, 500 mg at 04/17/22 0831   atorvastatin (LIPITOR) tablet 80 mg, 80 mg, Oral, Daily, Gilles Chiquito B, MD, 80 mg at 04/17/22 0831   Chlorhexidine Gluconate Cloth 2 % PADS 6 each, 6 each, Topical, Daily, Val Riles, MD, 6 each at 04/17/22 1112   enoxaparin (LOVENOX) injection 70 mg, 0.5 mg/kg, Subcutaneous, Q24H, Gilles Chiquito B, MD, 70 mg at 04/17/22 0831   guaiFENesin (MUCINEX) 12 hr tablet 600 mg, 600 mg, Oral, BID, Val Riles, MD, 600 mg at 04/17/22 0831   guaiFENesin-dextromethorphan (ROBITUSSIN DM) 100-10 MG/5ML syrup 5 mL, 5 mL, Oral, Q4H PRN, Gilles Chiquito B, MD, 5 mL at 04/13/22 2154   HYDROmorphone (DILAUDID) injection 0.5-1 mg, 0.5-1 mg, Intravenous, Q2H PRN, Gilles Chiquito B, MD, 1 mg at 04/14/22 2136   insulin aspart (novoLOG) injection 0-15 Units, 0-15 Units, Subcutaneous, TID WC, Gilles Chiquito B, MD, 3 Units at 04/17/22 1233   insulin aspart  (novoLOG) injection 0-5 Units, 0-5 Units, Subcutaneous, QHS, Gilles Chiquito B, MD, 2 Units at 04/13/22 2153   insulin glargine-yfgn (SEMGLEE) injection 25 Units, 25 Units, Subcutaneous, QHS, Sharen Hones, MD, 25 Units at 04/16/22 2328   iron polysaccharides (NIFEREX) capsule 150 mg, 150 mg, Oral, Daily, Val Riles, MD, 150 mg at 04/17/22 0831   methocarbamol (ROBAXIN) 500 mg in dextrose 5 % 50 mL IVPB, 500 mg, Intravenous, Q6H PRN, Sid Falcon, MD   midodrine (PROAMATINE) tablet 5 mg, 5 mg, Oral, TID WC, Singh, Harmeet, MD, 5 mg at 04/17/22 1228   ondansetron (ZOFRAN) tablet 4 mg, 4 mg, Oral, Q6H PRN **OR** ondansetron (ZOFRAN) injection 4 mg, 4 mg, Intravenous, Q6H PRN, Gilles Chiquito B, MD, 4 mg at 04/17/22 0920   oxyCODONE (Oxy IR/ROXICODONE) immediate release tablet 5 mg, 5 mg, Oral, Q4H PRN, Sid Falcon, MD,  5 mg at 04/16/22 1621   sodium chloride flush (NS) 0.9 % injection 3 mL, 3 mL, Intravenous, Q12H, Gilles Chiquito B, MD, 3 mL at 04/17/22 0847   spironolactone (ALDACTONE) tablet 50 mg, 50 mg, Oral, Daily, Breeze, Shantelle, NP   torsemide (DEMADEX) tablet 100 mg, 100 mg, Oral, Daily, Breeze, Shantelle, NP   Vitamin D (Ergocalciferol) (DRISDOL) 1.25 MG (50000 UNIT) capsule 50,000 Units, 50,000 Units, Oral, Q7 days, Val Riles, MD, 50,000 Units at 04/17/22 1228    ALLERGIES   Carvedilol, Metoprolol, Toprol xl [metoprolol tartrate], Buprenorphine, Buprenorphine hcl, Morphine, Morphine and related, Triamcinolone, and Triamcinolone acetonide     REVIEW OF SYSTEMS    Review of Systems:  Gen:  Denies  fever, sweats, chills weigh loss  HEENT: Denies blurred vision, double vision, ear pain, eye pain, hearing loss, nose bleeds, sore throat Cardiac:  No dizziness, chest pain or heaviness, chest tightness,edema Resp:   reports dyspnea chronically  Gi: Denies swallowing difficulty, stomach pain, nausea or vomiting, diarrhea, constipation, bowel incontinence Gu:  Denies bladder  incontinence, burning urine Ext:   Denies Joint pain, stiffness or swelling Skin: Denies  skin rash, easy bruising or bleeding or hives Endoc:  Denies polyuria, polydipsia , polyphagia or weight change Psych:   Denies depression, insomnia or hallucinations   Other:  All other systems negative   VS: BP (!) 140/53 (BP Location: Right Arm)   Pulse (!) 42   Temp (!) 97.5 F (36.4 C) (Oral)   Resp 18   Ht 6' (1.829 m)   Wt 127.3 kg   SpO2 100%   BMI 38.06 kg/m      PHYSICAL EXAM    GENERAL:NAD, no fevers, chills, no weakness no fatigue HEAD: Normocephalic, atraumatic.  EYES: Pupils equal, round, reactive to light. Extraocular muscles intact. No scleral icterus.  MOUTH: Moist mucosal membrane. Dentition intact. No abscess noted.  EAR, NOSE, THROAT: Clear without exudates. No external lesions.  NECK: Supple. No thyromegaly. No nodules. No JVD.  PULMONARY: decreased breath sounds with mild rhonchi worse at bases bilaterally.  CARDIOVASCULAR: S1 and S2. Regular rate and rhythm. No murmurs, rubs, or gallops. No edema. Pedal pulses 2+ bilaterally.  GASTROINTESTINAL: Soft, nontender, nondistended. No masses. Positive bowel sounds. No hepatosplenomegaly.  MUSCULOSKELETAL: No swelling, clubbing, or edema. Range of motion full in all extremities.  NEUROLOGIC: Cranial nerves II through XII are intact. No gross focal neurological deficits. Sensation intact. Reflexes intact.  SKIN: No ulceration, lesions, rashes, or cyanosis. Skin warm and dry. Turgor intact.  PSYCHIATRIC: Mood, affect within normal limits. The patient is awake, alert and oriented x 3. Insight, judgment intact.       IMAGING     ASSESSMENT/PLAN   Acute on chronic hypoxemic respiratory failure Present on admission due to influenza A infection with recurrent left pleural effusion and associated compressive atelectasis -Patient's evaluated by nephrology and is status post Lasix drip as well as pigtail chest tube on the  left with 400cc serosanguineous fluid overnight and active drain during examination today -Is no air leak upon forced aspiratory maneuver, chest tube is not clogged and fluid is freely flowing. -Fluid was previously tested and was noted to be lymphocyte predominant fluid and became exudative upon initiation of diuretics which is known to happen.  Does not appear to be empyema and I do not think he is a very good candidate for intrapleural thrombolytics.  Patient may be heading towards dialysis but if not he may also benefit from thoracic surgery evaluation  and possible pleurodesis.     CKD - per nephro    Rutixan plan at some point.    Thank you for allowing me to participate in the care of this patient.   Patient/Family are satisfied with care plan and all questions have been answered.    Provider disclosure: Patient with at least one acute or chronic illness or injury that poses a threat to life or bodily function and is being managed actively during this encounter.  All of the below services have been performed independently by signing provider:  review of prior documentation from internal and or external health records.  Review of previous and current lab results.  Interview and comprehensive assessment during patient visit today. Review of current and previous chest radiographs/CT scans. Discussion of management and test interpretation with health care team and patient/family.   This document was prepared using Dragon voice recognition software and may include unintentional dictation errors.     Ottie Glazier, M.D.  Division of Pulmonary & Critical Care Medicine

## 2022-04-17 NOTE — Progress Notes (Signed)
[facility] Progress Note  Patient: [name]    IWL:[id number]  DOA: 04/11/2022     Date of Service: the patient was seen and examined on 04/17/2022  Chief Complaint  Patient presents with   Shortness of Breath   Brief hospital course: [name] is a 72 y.o. male with PMH of arthritis, CHF (chronic), CAD, DM2, HLD, HTN, h/o treated melanoma, spinal stenosis who presents for worsening cough, fatigue and sob. Had low-grade fever, he was found to have positive for influenza A. He had recurrent left-sided pleural effusion, had a thoracentesis x 2 during the last months.  Upon arrival emergency room, chest x-ray showed large left-sided pleural effusion again, chest tube was placed by ED physician. Patient also developed significant hypoxemia with oxygen saturation as low as 76% on room air this morning.  Treated with oxygen. Patient has severe volume overload with evidence of bichamber heart failure.  Echocardiogram showed EF 45-50% with grade 2 diastolic dysfunction. Pulm valve not visualized.  Furosemide drip started after seen by nephrology.   1/29.  Patient had minimal urine after Lasix, but chest tube had drained additional 1500 mL in 24 hours.    Assessment and Plan: Acute on chronic combined systolic and diastolic congestive heart failure Acute hypoxemic respiratory failure secondary to volume overload. Elevated troponin with NSTEMI. Coronary artery disease. Patient has significant hypoxemia, oxygen dropped down to 76% when oxygen is off.  Patient has significant short of breath with minimal exertion. Patient also has significant volume overload.  He has evidence of bichamber heart failure.  He has severe bilateral leg edema.  Also has significant elevation of BNP. Discussed with nephrology and cardiology,started lasix drip Abdominal ultrasound did not show any ascites. Patient has been making urine, urine output was not accurate due to acute incontinent of urine.   Continue to follow with Lasix drip. Patient also has significant drainage from the left-sided chest tube, which may have made the volume status better. Continue oxygen, oxygenation seems to be better.   Influenza A. S/p Tamiflu for 5 days, last dose on 04/16/22 2/1 started Mucinex 600 mg p.o. twice daily due to chest congestion  Recurrent left side pleural effusion.  Hemothorax ruled out. Chest tube was placed in the emergency room, removed 2 L of serous fluids.  No evidence of bleeding.  Based on prior lab test, cytology was negative.  Prior test also does not indicate infection.  Reviewed chest x-ray post chest tube, no evidence of pneumonia. Procalcitonin level not significant elevated at 0.2 with chronic renal function.  No evidence of infection.  Patient is followed by pulmonology, patient appears to have rapid accumulation of pleural fluid.  Had a discussion with CT surgery, pulmonology and nephrology.  Patient still has a large amount of the drain from the chest tube, patient cannot be discharged with chest tube in place.  The source of increased pleural effusion is due to heart failure, third spacing from renal disease.  Patient will be given albumin, diuretics.  If this does not improve, we may have to use Pleurx catheter.   Acute kidney injury on chronic kidney disease stage II. Idiopathic membranous glomerulonephritis. Hyponatremia. Patient has a worsening renal function on chronic renal disease.  He is grossly volume overloaded, he is started on Lasix drip.  Renal function is gradually getting worse.  There is a possibility that patient may need a dialysis. Nephrology is working with oncology to start rituximab.    Iron deficient anemia. Acute blood  loss anemia ruled out. Patient received IV iron, hemoglobin dropped down to 6.9, chest tube fluids does not seem to be bloody.  s/p 1 unit PRBC.  No evidence of GI bleed. Hb 7.2  Continue to monitor H&H and transfuse if hemoglobin less  than 7   Uncontrolled type 2 diabetes with hyperglycemia Continue long-acting insulin, sliding scale insulin.   Essential hypertension Continue to monitor.   Morbid obesity BMI 41.47, Likely obstructive sleep apnea. Diet exercise. Will decide if patient need BiPAP before discharge.   Urinary retention, Foley catheter was inserted on 04/16/2022 Started Flomax Continue Foley catheter for 1 week and then DC when patient is more ambulatory  Body mass index is 41.47 kg/m.  Interventions:       Diet: Carb modified diet DVT Prophylaxis: Subcutaneous Lovenox   Advance goals of care discussion: Full code  Family Communication: family was not present at bedside, at the time of interview.  The pt provided permission to discuss medical plan with the family. Opportunity was given to ask question and all questions were answered satisfactorily.   Disposition:  Pt is from Home, admitted with anasarca and left pleural effusion, still has left-sided chest tube and on IV Lasix infusion, which precludes a safe discharge. Discharge to home, when clinically stable, needs pulmonary clearance.  Subjective: No significant events overnight, in the morning time patient had 1 episode of hemoptysis, no continuous bleeding.  Denies any nasal bleeding.  Possible nocturnal nasal bleeding and causing hemoptysis in the morning.  It happened yesterday as well. RN was advised to use humidified oxygen. Patient feels improvement in the shortness of breath, and chest congestion.  No any other complaints.  Physical Exam: General: NAD, lying comfortably Appear in no distress, affect appropriate Eyes: PERRLA ENT: Oral Mucosa Clear, moist  Neck: no JVD,  Cardiovascular: S1 and S2 Present, no Murmur,  Respiratory: good respiratory effort, Bilateral Air entry equal and Decreased, bilateral crackles, decreased breath sounds in the left lower chest, no wheezing, left chest tube intact Abdomen: Bowel Sound present,  Soft and no tenderness,  Skin: no rashes Extremities: 4+ Pedal edema, no calf tenderness Neurologic: without any new focal findings Gait not checked due to patient safety concerns  Vitals:   04/17/22 0356 04/17/22 0743 04/17/22 1153 04/17/22 1556  BP:  127/62 (!) 127/50 (!) 140/53  Pulse:  80 77 (!) 42  Resp:  '16 18 18  '$ Temp:  99 F (37.2 C) 98.2 F (36.8 C) (!) 97.5 F (36.4 C)  TempSrc:  Oral Oral Oral  SpO2:  98% 97% 100%  Weight: 127.3 kg     Height:        Intake/Output Summary (Last 24 hours) at 04/17/2022 1611 Last data filed at 04/17/2022 1400 Gross per 24 hour  Intake 771.88 ml  Output 2730 ml  Net -1958.12 ml   Filed Weights   04/11/22 2039 04/16/22 1429 04/17/22 0356  Weight: (!) 138.7 kg 128.7 kg 127.3 kg    Data Reviewed: I have personally reviewed and interpreted daily labs, tele strips, imagings as discussed above. I reviewed all nursing notes, pharmacy notes, vitals, pertinent old records I have discussed plan of care as described above with RN and patient/family.  CBC: Recent Labs  Lab 04/11/22 2033 04/12/22 0500 04/13/22 0520 04/14/22 0506 04/15/22 0235 04/16/22 0445 04/17/22 0443  WBC 5.3   < > 4.0 3.9* 5.1 4.1 6.0  NEUTROABS 4.5  --   --   --   --   --   --  HGB 8.1*   < > 7.3* 6.9* 7.7* 7.2* 7.2*  HCT 25.8*   < > 23.5* 21.4* 24.3* 22.0* 22.4*  MCV 89.9   < > 90.7 88.4 88.4 88.4 89.6  PLT 193   < > 195 180 204 196 182   < > = values in this interval not displayed.   Basic Metabolic Panel: Recent Labs  Lab 04/13/22 0520 04/14/22 0506 04/15/22 0235 04/16/22 0445 04/17/22 0443  NA 132* 132* 135 137 137  K 4.0 3.8 3.8 3.5 4.0  CL 97* 100 99 101 102  CO2 '24 26 29 26 29  '$ GLUCOSE 253* 141* 148* 114* 176*  BUN 76* 82* 83* 83* 77*  CREATININE 2.03* 2.16* 2.26* 2.08* 2.03*  CALCIUM 7.6* 7.4* 7.6* 7.7* 7.8*  MG 2.2 2.4 2.4 2.5* 2.3  PHOS  --   --   --  4.6 3.6    Studies: No results found.  Scheduled Meds:  sodium chloride    Intravenous Once   vitamin C  500 mg Oral Daily   atorvastatin  80 mg Oral Daily   Chlorhexidine Gluconate Cloth  6 each Topical Daily   enoxaparin (LOVENOX) injection  0.5 mg/kg Subcutaneous Q24H   guaiFENesin  600 mg Oral BID   insulin aspart  0-15 Units Subcutaneous TID WC   insulin aspart  0-5 Units Subcutaneous QHS   insulin glargine-yfgn  25 Units Subcutaneous QHS   iron polysaccharides  150 mg Oral Daily   midodrine  5 mg Oral TID WC   sodium chloride flush  3 mL Intravenous Q12H   spironolactone  50 mg Oral Daily   torsemide  100 mg Oral Daily   Vitamin D (Ergocalciferol)  50,000 Units Oral Q7 days   Continuous Infusions:  methocarbamol (ROBAXIN) IV     PRN Meds: acetaminophen **OR** acetaminophen, guaiFENesin-dextromethorphan, HYDROmorphone (DILAUDID) injection, methocarbamol (ROBAXIN) IV, ondansetron **OR** ondansetron (ZOFRAN) IV, oxyCODONE  Time spent: 35 minutes  Author: Val Riles. MD [facility] 04/17/2022 4:11 PM  To reach On-call, see care teams to locate the attending and reach out to them via [email or web address]. If 7PM-7AM, please contact night-coverage If you still have difficulty reaching the attending provider, please page the [facility] (Director on Call) for [facility] on amion for assistance.

## 2022-04-17 NOTE — Progress Notes (Signed)
[place] NOTE       Patient ID: DAYMEN HASSEBROCK MRN: [id number] DOB/AGE: May 19, 1950 72 y.o.  Admit date: 04/11/2022 Referring Physician Dr. Roosevelt Locks Primary Physician Dr. Lovie Macadamia Primary Cardiologist Dr. [name], Leroy Libman, PA-C  Reason for Consultation AoCHF  HPI: Jannifer Franklin "[name]" Shearer is a 6yoM with a PMH of HFmrEF (45-50%, global hypo, mod LVH, g2dd 03/2022), CAD s/p PCI mid LAD (2001), HLD, DM2, IDA, melanoma s/p excision (2018), idiopathic membranous glomerulonephritis w/ nephrotic syndrome, recurrent bilateral pleural effusions who presented to [facility] ED 04/11/22 with confusion, hemoptysis and increased shortness of breath x 1 day. Positive for influenza A. Febrile and tachycardic on admission, CTA chest with large left effusion s/p pigtail chest tube in the ED on 1/28. Hospital course complicated by [facility], AKI following diuresis, and anemia requiring 1 unit pRBCs & IV iron. Remains on supplemental O2 and a lasix infusion + albumin, chest tube with significant output.   Interval History:  - no acute events.  - feels better in general today, has good appetite. No chest pain. Congestion somewhat improving.  - I/O inaccurate (leaking on bed), weight down from 137.9kg to 127.3kg  - renal function stable  Review of systems complete and found to be negative unless listed above     Past Medical History:  Diagnosis Date   Arthritis    CHF (congestive heart failure), NYHA class II, acute on chronic, systolic (HCC)    EF 09% 05/2353   Coronary artery disease    [facility]   Diabetes mellitus without complication ([place])    Dysplastic nevus 08/26/2020   L flank, moderate atypia   Hyperlipidemia    Hypertension    Melanoma ([place]) 08/20/2015   Right neck. Superficial spreading, arising in nevus. Tumor thickness 1.14m, Anatomic level IV   [place]   R lateral neck ant to scar, exc 05/22/20   Numbness in right leg     due to back   Skin cancer    removed l arm   Spinal stenosis    Stented coronary artery 2000   X 1 STENT   Swelling of both lower extremities     Past Surgical History:  Procedure Laterality Date   CATARACT EXTRACTION W/PHACO Right 05/03/2019   Procedure: CATARACT EXTRACTION PHACO AND INTRAOCULAR LENS PLACEMENT (IOC) RIGHT DIABETIC 6.44 00:58.4 11.0%;  Surgeon: BLeandrew Koyanagi MD;  [place]  Service: Ophthalmology;  Laterality: Right;  DIABETIC   CATARACT EXTRACTION W/PHACO Left 06/28/2019   Procedure: CATARACT EXTRACTION PHACO AND INTRAOCULAR LENS PLACEMENT (IOC) LEFT DIABETIC 7.99  01:13.4  10.9% ;  Surgeon: BLeandrew Koyanagi MD;  Location: [facility]  Service: Ophthalmology;  Laterality: Left;  Diabetic - insulin and oral meds   CHOLECYSTECTOMY     JOINT REPLACEMENT     RT KNEE   LUMBAR LAMINECTOMY/DECOMPRESSION MICRODISCECTOMY  04/13/2012   Procedure: LUMBAR LAMINECTOMY/DECOMPRESSION MICRODISCECTOMY 1 LEVEL;  Surgeon: JJohnn Hai MD;  Location: [facility];  Service: Orthopedics;  Laterality: Bilateral;  L4-L5   UVULECTOMY  1990's    Medications Prior to Admission  Medication Sig Dispense Refill Last Dose   aspirin EC 81 MG tablet Take 162 mg by mouth daily. Swallow whole.   04/10/2022   atorvastatin (LIPITOR) 80 MG tablet Take 80 mg by mouth daily.   04/11/2022   Continuous Blood Gluc Receiver (DEXCOM G6 RECEIVER) DEVI Use to monitor blood sugar. [id number]  04/11/2022   Continuous Blood Gluc  Sensor (DEXCOM G6 SENSOR) MISC Use to monitor blood sugar.  Replace every 10 days. [place] [id number].   04/11/2022   Continuous Blood Gluc Transmit (DEXCOM G6 TRANSMITTER) MISC Use to monitor blood sugar.  Replace every 3 months. [place] [id number].   04/11/2022   insulin glargine (LANTUS) 100 UNIT/ML injection Inject 34 Units into the skin daily.   04/10/2022   metFORMIN (GLUCOPHAGE) 500 MG tablet Take 1,000 mg by mouth daily with supper.      torsemide  (DEMADEX) 100 MG tablet Take 100 mg by mouth daily.   04/10/2022   amLODipine (NORVASC) 5 MG tablet Take 5 mg by mouth daily. (Patient not taking: Reported on 04/12/2022)   Not Taking   FEROSUL 325 (65 Fe) MG tablet Take 1 tablet (325 mg total) by mouth 2 (two) times daily. (Patient not taking: Reported on 04/12/2022) 180 tablet 1 Not Taking   ferrous sulfate (FEROSUL) 325 (65 FE) MG tablet Take by mouth.   04/10/2022   furosemide (LASIX) 20 MG tablet Take by mouth. (Patient not taking: Reported on 04/12/2022)   Not Taking   furosemide (LASIX) 80 MG tablet Take by mouth. (Patient not taking: Reported on 04/12/2022)   Not Taking   glipiZIDE (GLUCOTROL XL) 5 MG 24 hr tablet       insulin aspart (NOVOLOG) 100 UNIT/ML injection Inject into the skin 3 (three) times daily before meals.      Insulin Pen Needle (COMFORT EZ PEN NEEDLES) 32G X 8 MM MISC See admin instructions.      isosorbide mononitrate (IMDUR) 30 MG 24 hr tablet Take by mouth.      JARDIANCE 10 MG TABS tablet       LANTUS SOLOSTAR 100 UNIT/ML Solostar Pen SMARTSIG:44 Unit(s) SUB-Q Every Night      losartan (COZAAR) 100 MG tablet  (Patient not taking: Reported on 04/12/2022)   Not Taking   NOVOLOG FLEXPEN 100 UNIT/ML FlexPen INJ UP TO 50 UNI D [place] IN MULTIPLE INJECTIONS B MEALS UTD      spironolactone (ALDACTONE) 25 MG tablet Take 50 mg by mouth daily.   04/10/2022   urea (CARMOL) 10 % cream Apply topically. (Patient not taking: Reported on 02/19/2022)      valsartan (DIOVAN) 320 MG tablet Take 320 mg by mouth daily.   04/10/2022   Social History   Socioeconomic History   Marital status: Widowed    Spouse name: Not on file   Number of children: Not on file   Years of education: Not on file   Highest education level: Not on file  Occupational History   Not on file  Tobacco Use   Smoking status: Never   Smokeless tobacco: Never  Vaping Use   Vaping Use: Never used  Substance and Sexual Activity   Alcohol use: Yes    Comment: rare    Drug use: No   Sexual activity: Not on file  Other Topics Concern   Not on file  Social History Narrative   Not on file   Social Determinants of Health   Financial Resource Strain: Not on file  Food Insecurity: No Food Insecurity (04/13/2022)   Hunger Vital Sign    [name]: Never true    [name]: Never true  Transportation Needs: No Transportation Needs (04/13/2022)   PRAPARE - Hydrologist (Medical): No    Lack of Transportation (Non-Medical): No  Physical Activity: Not on file  Stress: Not on file  Social Connections: Not on file  Intimate Partner Violence: Not At Risk (04/13/2022)   Humiliation, Afraid, Rape, and Kick questionnaire    Fear of Current or Ex-Partner: No    Emotionally Abused: No    Physically Abused: No    Sexually Abused: No    History reviewed. No pertinent family history.    Intake/Output Summary (Last 24 hours) at 04/17/2022 0823 Last data filed at 04/17/2022 0640 Gross per 24 hour  Intake 171.88 ml  Output 2620 ml  Net -2448.12 ml     Vitals:   04/16/22 2326 04/17/22 0353 04/17/22 0356 04/17/22 0743  BP: 135/60 135/60  127/62  Pulse: 75 81  80  Resp: '17 20  16  '$ Temp: 98.2 F (36.8 C) 98.7 F (37.1 C)  99 F (37.2 C)  TempSrc: Oral   Oral  SpO2: 97% 99%  98%  Weight:   127.3 kg   Height:        PHYSICAL EXAM General: ill appearing caucasian male, in no acute distress. Sitting upright in bed, HEENT:  Normocephalic and atraumatic. Neck:  No JVD.  Lungs: Normal respiratory effort on O2 by [place]. Decreased breath sounds with rhonchi throughout Chest: L pigtail chest tube, straw colored output Heart: HRRR . Normal S1 and S2 without gallops or murmurs.  Abdomen: Non-distended appearing.  Msk: Normal strength and tone for age. Extremities:  No clubbing, cyanosis. 1+ bilateral LE edema although improving. ACE wraps up to knees. Generalized pedal  edema Neuro: Alert and oriented X 3. Psych:  Answers questions appropriately.   Labs: Basic Metabolic Panel: Recent Labs    04/16/22 0445 04/17/22 0443  NA 137 137  K 3.5 4.0  CL 101 102  CO2 26 29  GLUCOSE 114* 176*  BUN 83* 77*  CREATININE 2.08* 2.03*  CALCIUM 7.7* 7.8*  MG 2.5* 2.3  PHOS 4.6 3.6    Liver Function Tests: Recent Labs    04/15/22 0235  AST 20  ALT 11  ALKPHOS 60  BILITOT 1.2  PROT 5.6*  ALBUMIN 2.9*    No results for input(s): "LIPASE", "AMYLASE" in the last 72 hours. CBC: Recent Labs    04/16/22 0445 04/17/22 0443  WBC 4.1 6.0  HGB 7.2* 7.2*  HCT 22.0* 22.4*  MCV 88.4 89.6  PLT 196 182    Cardiac Enzymes: No results for input(s): "CKTOTAL", "CKMB", "CKMBINDEX", "TROPONINIHS" in the last 72 hours. BNP: No results for input(s): "BNP" in the last 72 hours. D-Dimer: No results for input(s): "DDIMER" in the last 72 hours. Hemoglobin A1C: No results for input(s): "HGBA1C" in the last 72 hours. Fasting Lipid Panel: No results for input(s): "CHOL", "HDL", "LDLCALC", "TRIG", "CHOLHDL", "LDLDIRECT" in the last 72 hours. Thyroid Function Tests: No results for input(s): "TSH", "T4TOTAL", "T3FREE", "THYROIDAB" in the last 72 hours.  Invalid input(s): "FREET3" Anemia Panel: No results for input(s): "VITAMINB12", "FOLATE", "FERRITIN", "TIBC", "IRON", "RETICCTPCT" in the last 72 hours.   Radiology: [facility] Chest Port 1 View  Result Date: 04/15/2022 CLINICAL DATA:  Left pleural effusion. EXAM: PORTABLE CHEST 1 VIEW COMPARISON:  April 14, 2022. FINDINGS: Stable cardiomegaly. Stable left basilar atelectasis or infiltrate is noted with associated pleural effusion. Minimal right basilar subsegmental atelectasis is noted. Bony thorax is unremarkable. IMPRESSION: Stable left basilar atelectasis or infiltrate is noted with associated pleural effusion. Minimal right basilar subsegmental atelectasis. Electronically Signed   By: Marijo Conception M.D.   On:  04/15/2022 14:49   DG Chest Port 1 View  Result Date: 04/14/2022 CLINICAL DATA:  Left-sided pleural effusion EXAM: PORTABLE CHEST 1 VIEW COMPARISON:  04/12/2022 FINDINGS: Cardiomegaly. Similar appearance of layering left pleural effusion and associated atelectasis or consolidation. Left-sided chest tube remains in position about the left lung base. No pneumothorax. No new airspace opacity. Osseous structures unremarkable. The visualized skeletal structures are unremarkable. IMPRESSION: 1. Similar appearance of layering left pleural effusion and associated atelectasis or consolidation. Left-sided chest tube remains in position about the left lung base. No pneumothorax. No new airspace opacity. 2. Cardiomegaly. Electronically Signed   By: Delanna Ahmadi M.D.   On: 04/14/2022 12:58   ECHOCARDIOGRAM COMPLETE  Result Date: 04/12/2022    ECHOCARDIOGRAM REPORT   Patient Name:   TAIDEN RAYBOURN Date of Exam: 04/12/2022 Medical Rec #:  [id number]      Height:       72.0 in Accession #:    [id number]     Weight:       305.8 lb Date of Birth:  [birth year]      BSA:          2.552 m Patient Age:    70 years       BP:           136/99 mmHg Patient Gender: M              HR:           101 bpm. Exam Location:  [facility] Procedure: 2D Echo Indications:     CHF I50.21  History:         Patient has no prior history of Echocardiogram examinations.  Sonographer:     Kathlen Brunswick RDCS Referring Phys:  [id number] Sharen Hones Diagnosing Phys: Yolonda Kida MD  Sonographer Comments: Technically challenging study due to limited acoustic windows, patient is obese and suboptimal apical window. Image acquisition challenging due to patient body habitus. Difficult and challenging study due to left side chest tube and  drain at the time of this study. IMPRESSIONS  1. Left ventricular ejection fraction, by estimation, is 45 to 50%. The left ventricle has mildly decreased function. The left ventricle demonstrates global hypokinesis. The left  ventricular internal cavity size was moderately to severely dilated. There  is moderate concentric left ventricular hypertrophy. Left ventricular diastolic parameters are consistent with Grade II diastolic dysfunction (pseudonormalization).  2. Right ventricular systolic function is mildly reduced. The right ventricular size is moderately enlarged. Mildly increased right ventricular wall thickness.  3. Left atrial size was mildly dilated.  4. Right atrial size was mildly dilated.  5. Moderate pleural effusion in the left lateral region.  6. The mitral valve was not well visualized. Trivial mitral valve regurgitation.  7. The aortic valve is grossly normal. Aortic valve regurgitation is not visualized. Aortic valve sclerosis is present, with no evidence of aortic valve stenosis. FINDINGS  Left Ventricle: Left ventricular ejection fraction, by estimation, is 45 to 50%. The left ventricle has mildly decreased function. The left ventricle demonstrates global hypokinesis. The left ventricular internal cavity size was moderately to severely dilated. There is moderate concentric left ventricular hypertrophy. Left ventricular diastolic parameters are consistent with Grade II diastolic dysfunction (pseudonormalization). Right Ventricle: The right ventricular size is moderately enlarged. Mildly increased right ventricular wall thickness. Right ventricular systolic function is mildly reduced. Left Atrium: Left atrial size was mildly dilated. Right Atrium: Right atrial size was mildly dilated. Pericardium: There is no evidence of pericardial  effusion. Mitral Valve: The mitral valve was not well visualized. Trivial mitral valve regurgitation. Tricuspid Valve: The tricuspid valve is not well visualized. Tricuspid valve regurgitation is mild. Aortic Valve: The aortic valve is grossly normal. Aortic valve regurgitation is not visualized. Aortic valve sclerosis is present, with no evidence of aortic valve stenosis. Aortic valve peak  gradient measures 3.0 mmHg. Pulmonic Valve: The pulmonic valve was normal in structure. Pulmonic valve regurgitation is not visualized. Aorta: The aortic arch was not well visualized. IAS/Shunts: No atrial level shunt detected by color flow Doppler. Additional Comments: There is a moderate pleural effusion in the left lateral region.  LEFT VENTRICLE PLAX 2D LVIDd:         5.90 cm      Diastology LVIDs:         4.60 cm      LV e' medial:    8.49 cm/s LV PW:         1.50 cm      LV E/e' medial:  13.5 LV IVS:        1.60 cm      LV e' lateral:   8.49 cm/s LVOT diam:     2.30 cm      LV E/e' lateral: 13.5 LV SV:         43 LV SV Index:   17 LVOT Area:     4.15 cm  LV Volumes (MOD) LV vol d, MOD A4C: 254.0 ml LV vol s, MOD A4C: 175.0 ml LV SV MOD A4C:     254.0 ml LEFT ATRIUM           Index LA diam:      4.20 cm 1.65 cm/m LA Vol (A4C): 43.0 ml 16.85 ml/m  AORTIC VALVE                 PULMONIC VALVE AV Area (Vmax): 2.99 cm     PV Vmax:       0.67 m/s AV Vmax:        86.60 cm/s   PV Peak grad:  1.8 mmHg AV Peak Grad:   3.0 mmHg LVOT Vmax:      62.30 cm/s LVOT Vmean:     41.800 cm/s LVOT VTI:       0.103 m  AORTA Ao Root diam: 3.70 cm Ao Asc diam:  2.90 cm MITRAL VALVE MV Area (PHT): 6.90 cm     SHUNTS MV Decel Time: 110 msec     Systemic VTI:  0.10 m MV E velocity: 115.00 cm/s  Systemic Diam: 2.30 cm MV A velocity: 82.30 cm/s MV E/A ratio:  1.40 Yolonda Kida MD Electronically signed by Yolonda Kida MD Signature Date/Time: 04/12/2022/4:14:41 PM    Final    US Abdomen Complete  Result Date: 04/12/2022 CLINICAL DATA:  Cirrhosis with ascites. EXAM: ABDOMEN ULTRASOUND COMPLETE COMPARISON:  Right upper quadrant ultrasound on 04/08/2022 FINDINGS: Gallbladder: Surgically absent. Common bile duct: Diameter: 7 mm, within normal limits post cholecystectomy. Liver: Suboptimal visualization due to patient habitus and limited acoustic windows. No focal liver lesion identified. Within normal limits in parenchymal  echogenicity. Portal vein is patent on color Doppler imaging with normal direction of blood flow towards the liver. IVC: No abnormality visualized. Pancreas: Not visualized due to patient habitus and bowel gas. Spleen: Could not be visualized due to bandages from left chest tube. Right Kidney: Length: 11.9 cm. Echogenicity within normal limits. No mass or hydronephrosis visualized. Left Kidney: Length: 11.8 cm. Echogenicity  within normal limits. No mass or hydronephrosis visualized. Abdominal aorta: Not visualized due to overlying bowel gas. Other findings: No evidence of ascites. IMPRESSION: Technically limited exam due to patient habitus and bandages from chest tube. Prior cholecystectomy.  No evidence of biliary ductal dilatation. No definite hepatic abnormality identified.  No evidence of ascites. Electronically Signed   By: Marlaine Hind M.D.   On: 04/12/2022 12:44   DG Chest Port 1 View  Result Date: 04/12/2022 CLINICAL DATA:  Chest tube in place EXAM: PORTABLE CHEST 1 VIEW COMPARISON:  Chest x-ray April 12, 2022 FINDINGS: A left chest tube is in stable position. Persistent effusion with underlying opacity, similar to mildly worsened in the interval. No pneumothorax. No other changes. IMPRESSION: A left chest tube remains in place. Persistent effusion with underlying opacity, similar to mildly worsened in the interval. No pneumothorax. Electronically Signed   By: Dorise Bullion III M.D.   On: 04/12/2022 08:19   DG Chest Portable 1 View  Result Date: 04/12/2022 CLINICAL DATA:  Post chest tube placement EXAM: PORTABLE CHEST 1 VIEW COMPARISON:  Radiograph and CT 04/11/2022 FINDINGS: Decreased now small left pleural effusion after left basilar chest tube placement. Decreased left mid and lower lung atelectasis/consolidation. The right lung is clear. No pneumothorax. Stable cardiomediastinal silhouette. Aortic atherosclerotic calcification. IMPRESSION: Decreased now small left pleural effusion after left  basilar chest tube placement. Electronically Signed   By: Placido Sou M.D.   On: 04/12/2022 00:31   CT Angio Chest PE W and/or Wo Contrast  Result Date: 04/11/2022 CLINICAL DATA:  Confusion and coughing up blood EXAM: CT ANGIOGRAPHY CHEST WITH CONTRAST TECHNIQUE: Multidetector CT imaging of the chest was performed using the standard protocol during bolus administration of intravenous contrast. Multiplanar CT image reconstructions and MIPs were obtained to evaluate the vascular anatomy. RADIATION DOSE REDUCTION: This exam was performed according to the departmental dose-optimization program which includes automated exposure control, adjustment of the mA and/or kV according to patient size and/or use of iterative reconstruction technique. CONTRAST:  17m OMNIPAQUE IOHEXOL 350 MG/ML SOLN COMPARISON:  CTA chest 10/15/2019 and radiographs earlier today FINDINGS: Cardiovascular: Satisfactory opacification of the pulmonary arteries to the segmental level. No evidence of pulmonary embolism. Hazy hyperdensity layering along the periphery of the left lower lobe. This is indeterminate on single phase exam and could be due to heterogenous perfusion in the setting of pneumonia however is suspicious for active contrast extravasation possibly related to recent thoracentesis and/or AVM as this appears rounded peripherally (circa series 6/image 255-268). No definite feeding vessel is seen. Cardiomegaly. No pericardial effusion. Coronary artery and aortic atherosclerotic calcification. Mediastinum/Nodes: Shotty mediastinal and hilar lymph nodes have increased since 10/15/2019. The largest is a precarinal node measuring 1.3 cm (4/47). These are favored reactive. Unremarkable esophagus. Lungs/Pleura: Large left pleural effusion some of which is hyperdense posteriorly suspicious for hemothorax. Hazy ground-glass opacities in the superior segment left lower lobe and low-attenuation consolidation in the inferior left lower lobe.  These findings may be due to hemorrhage and/or pneumonia. Secretions/fluid within the left mainstem bronchus. The left lower lobe bronchi are not visualized. Secretions/fluid within the lingular bronchi. Atelectasis/consolidation in the lingula. Mild bronchial wall thickening and clustered centrilobular micro nodularity in the right lung. Part solid and subsolid nodule in the right upper lobe measuring 1.0 cm with the solid component measuring 3 mm (6/66). Upper Abdomen: Cholecystectomy.  No acute abnormality. Musculoskeletal: No chest wall abnormality. No acute osseous findings. Review of the MIP images confirms the above  findings. IMPRESSION: 1. Findings concerning for active hemorrhage in the left lower lobe possibly related to recent thoracentesis and/or AVM versus heterogenous perfusion in the setting of pneumonia. 2. Large left hemothorax. 3. Ground-glass and low-attenuation consolidation in the left lower lobe suspicious for pneumonia and/or alveolar hemorrhage. 4. Nonspecific secretions or blood within the left mainstem bronchus left lower lobe bronchi and lingula. 5. Part solid and subsolid nodule in the right upper lobe measuring 1.0 cm. Follow-up non-contrast CT recommended at 3-6 months to confirm persistence. If unchanged, and solid component remains <6 mm, annual CT is recommended until 5 years of stability has been established. If persistent these nodules should be considered highly suspicious if the solid component of the nodule is 6 mm or greater in size and enlarging. This recommendation follows the consensus statement: Guidelines for Management of Incidental Pulmonary Nodules Detected on CT Images: From the Fleischner Society 2017; Radiology 2017; 284:228-243. Critical Value/emergent results were called by telephone at the time of interpretation on 04/11/2022 at 10:34 pm to provider PHILLIP STAFFORD , who verbally acknowledged these results. Electronically Signed   By: Placido Sou M.D.   On:  04/11/2022 22:50   DG Chest Portable 1 View  Result Date: 04/11/2022 CLINICAL DATA:  Shortness of breath and decreased left breath sounds EXAM: PORTABLE CHEST 1 VIEW COMPARISON:  Radiograph 04/02/2022 FINDINGS: Similar to increased large left pleural effusion and associated atelectasis/consolidation. The right lung is clear. No pneumothorax. Stable partially obscured cardiomediastinal silhouette. Aortic atherosclerotic calcification. IMPRESSION: Slightly increased large left pleural effusion and associated atelectasis/consolidation. Electronically Signed   By: Placido Sou M.D.   On: 04/11/2022 20:51   US Abdomen Limited RUQ (LIVER/GB)  Result Date: 04/08/2022 CLINICAL DATA:  Cirrhosis EXAM: ULTRASOUND ABDOMEN LIMITED RIGHT UPPER QUADRANT COMPARISON:  CT abdomen and pelvis April 07, 2011, renal ultrasound January 30, 2022 FINDINGS: Gallbladder: Surgically absent. Common bile duct: Diameter: 5.5 mm Liver: No focal lesion identified. Within normal limits in parenchymal echogenicity. Portal vein is patent on color Doppler imaging with normal direction of blood flow towards the liver. Other: None. IMPRESSION: No acute abnormality. Electronically Signed   By: Abelardo Diesel M.D.   On: 04/08/2022 09:12   DG Chest Port 1 View  Result Date: 04/02/2022 CLINICAL DATA:  status post thora EXAM: PORTABLE CHEST 1 VIEW COMPARISON:  Chest x-ray 03/24/2022. FINDINGS: Moderate to large left pleural effusion and overlying opacities. No visible pneumothorax. Partially obscured cardiomediastinal silhouette. No acute osseous abnormality. Polyarticular degenerative change. IMPRESSION: Moderate to large left pleural effusion and overlying opacities. No visible pneumothorax. Electronically Signed   By: Margaretha Sheffield M.D.   On: 04/02/2022 09:29   US THORACENTESIS ASP PLEURAL SPACE W/IMG GUIDE  Result Date: 04/02/2022 INDICATION: Shortness of breath with recurrent left pleural effusion. EXAM: ULTRASOUND GUIDED LEFT  THORACENTESIS MEDICATIONS: Local Lidocaine 1% only. COMPLICATIONS: None immediate. PROCEDURE: An ultrasound guided thoracentesis was thoroughly discussed with the patient and questions answered. The benefits, risks, alternatives and complications were also discussed. The patient understands and wishes to proceed with the procedure. Written consent was obtained. Ultrasound was performed to localize and mark an adequate pocket of fluid in the left chest. The area was then prepped and draped in the normal sterile fashion. 1% Lidocaine was used for local anesthesia. Under ultrasound guidance a 19 gauge, 10-cm, Yueh catheter was introduced. Thoracentesis was performed. The catheter was removed and a dressing applied. FINDINGS: A total of approximately 1.5 L of clear yellow colored fluid was removed. Samples were sent  to the laboratory as requested by the clinical team. IMPRESSION: Successful ultrasound guided left thoracentesis yielding 1.5 L of pleural fluid. This exam was performed by Tsosie Billing PA-C, and was supervised and interpreted by Dr. Annamaria Boots. Electronically Signed   By: Jerilynn Mages.  Shick M.D.   On: 04/02/2022 09:09   DG Chest Port 1 View  Result Date: 03/24/2022 CLINICAL DATA:  Post thoracentesis EXAM: PORTABLE CHEST 1 VIEW COMPARISON:  Chest XR, earlier same day and 03/13/2022. CT abdomen pelvis, 02/19/2022. FINDINGS: LEFT cardiac border is obscured. Aortic arch atherosclerosis. The RIGHT lung is well inflated. Interval decrease of prior large LEFT pleural effusion with small-to-moderate volume residual. LEFT basilar consolidative opacity, likely atelectasis. No pneumothorax. No interval osseous abnormality. IMPRESSION: 1. Post thoracentesis with volume residual LEFT pleural effusion. No pneumothorax. 2. Consolidative LEFT basilar opacity, likely atelectasis. Electronically Signed   By: Michaelle Birks M.D.   On: 03/24/2022 15:45   US THORACENTESIS ASP PLEURAL SPACE W/IMG GUIDE  Result Date:  03/24/2022 INDICATION: Patient with complaint of dyspnea x3 months. During pulmonary visit today, patient was found to have large left pleural effusion. Patient was referred to IR for diagnostic and therapeutic LEFT thoracentesis. EXAM: ULTRASOUND GUIDED DIAGNOSTIC AND THERAPEUTIC LEFT THORACENTESIS MEDICATIONS: 10 mL 1 % lidocaine COMPLICATIONS: None immediate. PROCEDURE: An ultrasound guided thoracentesis was thoroughly discussed with the patient and questions answered. The benefits, risks, alternatives and complications were also discussed. The patient understands and wishes to proceed with the procedure. Written consent was obtained. Ultrasound was performed to localize and mark an adequate pocket of fluid in the LEFT chest. The area was then prepped and draped in the normal sterile fashion. 1% Lidocaine was used for local anesthesia. Under ultrasound guidance a 6 Fr Safe-T-Centesis catheter was introduced. Thoracentesis was performed. The catheter was removed and a dressing applied. FINDINGS: A total of approximately 1.6 L of clear, yellow fluid was removed. Samples were sent to the laboratory as requested by the clinical team. IMPRESSION: Successful diagnostic and therapeutic LEFT thoracentesis yielding 1.6 L of pleural fluid. Read by: Narda Rutherford, AGNP-BC Electronically Signed   By: Michaelle Birks M.D.   On: 03/24/2022 15:31    ECHO 03/2022 1. Left ventricular ejection fraction, by estimation, is 45 to 50%. The  left ventricle has mildly decreased function. The left ventricle  demonstrates global hypokinesis. The left ventricular internal cavity size  was moderately to severely dilated. There   is moderate concentric left ventricular hypertrophy. Left ventricular  diastolic parameters are consistent with Grade II diastolic dysfunction  (pseudonormalization).   2. Right ventricular systolic function is mildly reduced. The right  ventricular size is moderately enlarged. Mildly increased right   ventricular wall thickness.   3. Left atrial size was mildly dilated.   4. Right atrial size was mildly dilated.   5. Moderate pleural effusion in the left lateral region.   6. The mitral valve was not well visualized. Trivial mitral valve  regurgitation.   7. The aortic valve is grossly normal. Aortic valve regurgitation is not  visualized. Aortic valve sclerosis is present, with no evidence of aortic  valve stenosis.   Exercise myoview 04/2021 Impression  Normal treadmill EKG without evidence of ischemia or arrhythmia Mild global LV systolic dysfunction with ejection fraction of 40% Mild fixed inferior myocardial fusion defect consistent with previous infarct scar but more likely diaphragmatic attenuation Clinical correlation advised  TELEMETRY reviewed by me (LT) 04/17/2022 : NSR 70s-80s RBBB  EKG reviewed by me: Sinus tach, RBBB, nonspec  ST changes  Data reviewed by me (LT) 04/17/2022: hospitalist, pulm, nephro progress notes, cbc, bmp, trops, bnp, vitals, tele    Principal Problem:   Influenza A Active Problems:   Iron deficiency anemia   Diabetes mellitus type 2 in obese (HCC)   CAD (coronary artery disease)   Essential (primary) hypertension   Hyperlipidemia, mixed   Obesity, Class III, BMI 40-49.9 (morbid obesity) ([place])   Chronic kidney disease due to type 2 diabetes mellitus (HCC)   Edema of lower extremity   Acute on chronic systolic CHF (congestive heart failure) (HCC)   Acute renal failure superimposed on stage 2 chronic kidney disease (HCC)   Recurrent left pleural effusion   Acute hypoxemic respiratory failure (HCC)   Chronic membranous glomerulonephritis    ASSESSMENT AND PLAN:  Jannifer Franklin "[name]" Picotte is a 63yoM with a PMH of 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 Positive for influenza A. Febrile and tachycardic on admission, CTA chest with large left effusion s/p pigtail chest tube in the ED on 1/28. Hospital course complicated by [facility], AKI following diuresis, and anemia requiring 1 unit pRBCs & IV iron. Remains on supplemental O2 and a lasix infusion, chest tube with significant output.   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 On lasix infusion + abumin as below.  # acute on chronic HFmrEF (45-50%, global hypo, g2dd 03/2022) Clinically hypervolemic, although improving. 3+ edema in lower extremities on admission, still with supplemental oxygen requirement.  -agree with lasix gtt at '4mg'$ /hr per nephrology -restart GDMT as BP, renal function allows. Relative hypotension precluding this currently - on valsartan '150mg'$  daily, lasix '40mg'$  PO once daily, and spiro '25mg'$  daily at home. Intolerant of metoprolol & coreg (itching)  - will arrange for follow up with Dr. Saralyn Pilar at discharge  # CAD without chest pain, hx PCI to mid LAD (2001) # demand ischemia  Chest pain free, EKGs showing NSR, RBBB, nonspecific T wave changes. Troponins trended 738, 747, 1039, most consistent with demand/supply mismatch and not ACS.  -once Hgb stable, restart aspirin '81mg'$  daily -continue atorvastatin '80mg'$  daily   # acute on chronic anemia S/p 1 unit PRBCS 1/30, IV iron  -hgb with drift from 7.7--7.2--7.2. continue to monitor closely   This patient's plan of care was discussed and created with Dr. Saralyn Pilar and he is in agreement.  Signed: Tristan Schroeder , PA-C 04/17/2022, 8:23  AM [facility]

## 2022-04-17 NOTE — Progress Notes (Addendum)
[facility]  ROUNDING NOTE   Subjective:  Patient well-known to [place] as we follow him for idiopathic membranous glomerulonephritis with nephrotic syndrome, chronic kidney disease stage II with diabetes mellitus type 2, proteinuria, and lower extremity edema.  Patient came in with significant shortness of breath.  Found to be positive for the flu.  Has a left-sided chest tube in place.  Baseline creatinine 1.15 with EGFR of 67.  Most recent urine protein to creatinine ratio was down to 4.9.  He was to be evaluated by hematology for treatment with rituximab.  Patient seen laying in bed, complaining of nausea Feels lower extremity edema has improved Remains on 2 L nasal cannula, denies shortness of breath Dry heaving at conclusion of visit  Objective:  Vital signs in last 24 hours:  Temp:  [97.8 F (36.6 C)-99 F (37.2 C)] 98.2 F (36.8 C) (02/02 1153) Pulse Rate:  [75-81] 77 (02/02 1153) Resp:  [16-20] 18 (02/02 1153) BP: (127-135)/(50-62) 127/50 (02/02 1153) SpO2:  [97 %-100 %] 97 % (02/02 1153) Weight:  [127.3 kg-128.7 kg] 127.3 kg (02/02 0356)  Weight change:  Filed Weights   04/11/22 2039 04/16/22 1429 04/17/22 0356  Weight: (!) 138.7 kg 128.7 kg 127.3 kg    Intake/Output: I/O last 3 completed shifts: In: 411.9 [P.O.:240; I.V.:138.2; IV Piggyback:33.7] Out: 3070 [Urine:2600; Chest Tube:470]   Intake/Output this shift:  Total I/O In: 600 [P.O.:600] Out: 500 [Urine:500]  Physical Exam: General: Ill appearing  Head: Normocephalic, atraumatic. Moist oral mucosal membranes  Lungs:  Bibasilar rales, normal effort  Heart: S1S2 no rubs  Abdomen:  Soft, nontender, bowel sounds present  Extremities: 2+ brawny peripheral edema.  Neurologic: Awake, alert, following commands  Skin: No acute rash    Basic Metabolic Panel: Recent Labs  Lab 04/13/22 0520 04/14/22 0506 04/15/22 0235 04/16/22 0445 04/17/22 0443  NA 132* 132* 135 137 137  K 4.0 3.8 3.8 3.5 4.0   CL 97* 100 99 101 102  CO2 '24 26 29 26 29  '$ GLUCOSE 253* 141* 148* 114* 176*  BUN 76* 82* 83* 83* 77*  CREATININE 2.03* 2.16* 2.26* 2.08* 2.03*  CALCIUM 7.6* 7.4* 7.6* 7.7* 7.8*  MG 2.2 2.4 2.4 2.5* 2.3  PHOS  --   --   --  4.6 3.6     Liver Function Tests: Recent Labs  Lab 04/11/22 2033 04/12/22 0500 04/15/22 0235  AST '23 24 20  '$ ALT '15 14 11  '$ ALKPHOS 72 60 60  BILITOT 1.4* 1.0 1.2  PROT 5.9* 5.8* 5.6*  ALBUMIN 2.5* 2.4* 2.9*    No results for input(s): "LIPASE", "AMYLASE" in the last 168 hours. No results for input(s): "AMMONIA" in the last 168 hours.  CBC: Recent Labs  Lab 04/11/22 2033 04/12/22 0500 04/13/22 0520 04/14/22 0506 04/15/22 0235 04/16/22 0445 04/17/22 0443  WBC 5.3   < > 4.0 3.9* 5.1 4.1 6.0  NEUTROABS 4.5  --   --   --   --   --   --   HGB 8.1*   < > 7.3* 6.9* 7.7* 7.2* 7.2*  HCT 25.8*   < > 23.5* 21.4* 24.3* 22.0* 22.4*  MCV 89.9   < > 90.7 88.4 88.4 88.4 89.6  PLT 193   < > 195 180 204 196 182   < > = values in this interval not displayed.     Cardiac Enzymes: No results for input(s): "CKTOTAL", "CKMB", "CKMBINDEX", "TROPONINI" in the last 168 hours.  BNP: Invalid input(s): "  POCBNP"  CBG: Recent Labs  Lab 04/16/22 1135 04/16/22 1715 04/16/22 2047 04/17/22 0741 04/17/22 1151  GLUCAP 167* 200* 191* 151* 185*     Microbiology: Results for orders placed or performed during the hospital encounter of 04/11/22  Resp panel by RT-PCR (RSV, Flu A&B, Covid) Anterior Nasal Swab     Status: Abnormal   Collection Time: 04/11/22  8:34 PM   Specimen: Anterior Nasal Swab  Result Value Ref Range Status   SARS Coronavirus 2 by RT PCR NEGATIVE NEGATIVE Final    Comment: (NOTE) SARS-CoV-2 target nucleic acids are NOT DETECTED.  The SARS-CoV-2 RNA is generally detectable in upper respiratory specimens during the acute phase of infection. The lowest concentration of SARS-CoV-2 viral copies this assay can detect is 138 copies/mL. A negative  result does not preclude SARS-Cov-2 infection and should not be used as the sole basis for treatment or other patient management decisions. A negative result may occur with  improper specimen collection/handling, submission of specimen other than nasopharyngeal swab, presence of viral mutation(s) within the areas targeted by this assay, and inadequate number of viral copies(<138 copies/mL). A negative result must be combined with clinical observations, patient history, and epidemiological information. The expected result is Negative.  Fact Sheet for Patients:  EntrepreneurPulse.com.au  Fact Sheet for Healthcare Providers:  IncredibleEmployment.be  This test is no t yet approved or cleared by the [place] FDA and  has been authorized for detection and/or diagnosis of SARS-CoV-2 by FDA under an Emergency Use Authorization (EUA). This EUA will remain  in effect (meaning this test can be used) for the duration of the COVID-19 declaration under Section 564(b)(1) of the Act, 21 U.S.C.section 360bbb-3(b)(1), unless the authorization is terminated  or revoked sooner.       Influenza A by PCR POSITIVE (A) NEGATIVE Final   Influenza B by PCR NEGATIVE NEGATIVE Final    Comment: (NOTE) The Xpert Xpress SARS-CoV-2/FLU/RSV plus assay is intended as an aid in the diagnosis of influenza from Nasopharyngeal swab specimens and should not be used as a sole basis for treatment. Nasal washings and aspirates are unacceptable for Xpert Xpress SARS-CoV-2/FLU/RSV testing.  Fact Sheet for Patients: EntrepreneurPulse.com.au  Fact Sheet for Healthcare Providers: IncredibleEmployment.be  This test is not yet approved or cleared by the [place] FDA and has been authorized for detection and/or diagnosis of SARS-CoV-2 by FDA under an Emergency Use Authorization (EUA). This EUA will remain in effect (meaning this test can be  used) for the duration of the COVID-19 declaration under Section 564(b)(1) of the Act, 21 U.S.C. section 360bbb-3(b)(1), unless the authorization is terminated or revoked.     Resp Syncytial Virus by PCR NEGATIVE NEGATIVE Final    Comment: (NOTE) Fact Sheet for Patients: EntrepreneurPulse.com.au  Fact Sheet for Healthcare Providers: IncredibleEmployment.be  This test is not yet approved or cleared by the [place] FDA and has been authorized for detection and/or diagnosis of SARS-CoV-2 by FDA under an Emergency Use Authorization (EUA). This EUA will remain in effect (meaning this test can be used) for the duration of the COVID-19 declaration under Section 564(b)(1) of the Act, 21 U.S.C. section 360bbb-3(b)(1), unless the authorization is terminated or revoked.  Performed at [facility], [address] [place], [place] [address]   Culture, blood (routine x 2)     Status: None   Collection Time: 04/11/22  9:59 PM   Specimen: BLOOD LEFT ARM  Result Value Ref Range Status   Specimen Description BLOOD LEFT  ARM  Final   Special Requests   Final    BOTTLES DRAWN AEROBIC AND ANAEROBIC Blood Culture adequate volume   Culture   Final    NO GROWTH 5 DAYS Performed at [facility], [address] [place], [place] [address]    Report Status 04/16/2022 FINAL  Final  Culture, blood (routine x 2)     Status: None   Collection Time: 04/11/22 10:01 PM   Specimen: BLOOD LEFT HAND  Result Value Ref Range Status   Specimen Description BLOOD LEFT HAND  Final   Special Requests   Final    BOTTLES DRAWN AEROBIC AND ANAEROBIC Blood Culture results may not be optimal due to an inadequate volume of blood received in culture bottles   Culture   Final    NO GROWTH 5 DAYS Performed at [facility], [address] [place], [place] [address]    Report Status 04/16/2022 FINAL  Final    Coagulation Studies: No results for  input(s): "LABPROT", "INR" in the last 72 hours.  Urinalysis: No results for input(s): "COLORURINE", "LABSPEC", "PHURINE", "GLUCOSEU", "HGBUR", "BILIRUBINUR", "KETONESUR", "PROTEINUR", "UROBILINOGEN", "NITRITE", "LEUKOCYTESUR" in the last 72 hours.  Invalid input(s): "APPERANCEUR"    Imaging: DG Chest Port 1 View  Result Date: 04/15/2022 CLINICAL DATA:  Left pleural effusion. EXAM: PORTABLE CHEST 1 VIEW COMPARISON:  April 14, 2022. FINDINGS: Stable cardiomegaly. Stable left basilar atelectasis or infiltrate is noted with associated pleural effusion. Minimal right basilar subsegmental atelectasis is noted. Bony thorax is unremarkable. IMPRESSION: Stable left basilar atelectasis or infiltrate is noted with associated pleural effusion. Minimal right basilar subsegmental atelectasis. Electronically Signed   By: Marijo Conception M.D.   On: 04/15/2022 14:49     Medications:    methocarbamol (ROBAXIN) IV      sodium chloride   Intravenous Once   vitamin C  500 mg Oral Daily   atorvastatin  80 mg Oral Daily   Chlorhexidine Gluconate Cloth  6 each Topical Daily   enoxaparin (LOVENOX) injection  0.5 mg/kg Subcutaneous Q24H   guaiFENesin  600 mg Oral BID   insulin aspart  0-15 Units Subcutaneous TID WC   insulin aspart  0-5 Units Subcutaneous QHS   insulin glargine-yfgn  25 Units Subcutaneous QHS   iron polysaccharides  150 mg Oral Daily   midodrine  5 mg Oral TID WC   sodium chloride flush  3 mL Intravenous Q12H   Vitamin D (Ergocalciferol)  50,000 Units Oral Q7 days   acetaminophen **OR** acetaminophen, guaiFENesin-dextromethorphan, HYDROmorphone (DILAUDID) injection, methocarbamol (ROBAXIN) IV, ondansetron **OR** ondansetron (ZOFRAN) IV, oxyCODONE  Assessment/ Plan:  72 y.o. male with past medical history of diabetes mellitus type 2, morbid obesity, hypertension, coronary artery disease status post stent placement, cardiomyopathy, low testosterone, hyperlipidemia, history of first left  toe osteomyelitis with amputation, hydronephrosis, osteoarthritis of the right hip, history of idiopathic membranous glomerulonephritis who presents with increasing shortness of breath.  1.  Idiopathic membranous glomerulonephritis/ Acute kidney injury on chronic kidney disease stage II/proteinuria most recent urine protein to creatinine ratio 4.9.  Patient was in the process of being evaluated for rituximab treatment of his idiopathic membranous glomerulonephritis. -Hold off on administering rituximab at this time. -Will stop Furosemide drip. Will transition to home dose of oral diuretics. Torsemide '100mg'$  dialy and spironolactone '50mg'$  daily.  2. Lower extremity edema. Improved with furosemide drip. Will stop drip and transition to oral management as above.   3.  Anemia of chronic kidney disease.  Hemoglobin  remains 7.2.  4.  Acute on chronic combined systolic and diastolic CHF, acute hypoxemic respiratory failure, volume overload, coronary artery disease. Volume management with Lasix drip as above. Recurrent left-sided pleural effusion secondary to nephrotic syndrome, third spacing of fluid.  Currently managed with left chest tube.  5.  Influenza A Completed Tamiflu.    LOS: [place] 2/2/20242:13 PM

## 2022-04-17 NOTE — TOC Progression Note (Signed)
Transition of Care [facility]) - Progression Note    Patient Details  Name: [name] MRN: [id number] Date of Birth: 30-Jan-1951  [facility] CM/SW Contact  Laurena Slimmer, RN Phone Number: 04/17/2022, 3:49 PM  Clinical Narrative:    Spoke with patient regarding therapy recommendation for Sn[facility]. He stated he would like to speak with his MD more before making a decision.         Expected Discharge Plan and Services                                               Social Determinants of Health (SDOH) Interventions SDOH Screenings   Food Insecurity: No Food Insecurity (04/13/2022)  Housing: Low Risk  (04/13/2022)  Transportation Needs: No Transportation Needs (04/13/2022)  Utilities: Not At Risk (04/13/2022)  Depression (PHQ2-9): Low Risk  (02/27/2022)  Recent Concern: Depression (PHQ2-9) - Medium Risk (02/02/2022)  Tobacco Use: Low Risk  (04/13/2022)    Readmission Risk Interventions     No data to display

## 2022-04-17 NOTE — Progress Notes (Signed)
PT Cancellation Note  Patient Details Name: [name] MRN: [id number] DOB: 08-29-1950   Cancelled Treatment:    Reason Eval/Treat Not Completed: Other (comment): Pt declined to participate with PT services this date secondary to having just recently worked with OT and due to R foot pain that the pt rated 2/10 at rest.  Pt declined offer to contact nursing regarding possible pain medication.  Will attempt to see pt at a future date/time as medically appropriate.    Linus Salmons PT, DPT 04/17/22, 3:54 PM

## 2022-04-17 NOTE — Progress Notes (Signed)
Occupational Therapy Treatment Patient Details Name: [name] MRN: [id number] DOB: 1950/04/26 Today's Date: 04/17/2022   History of present illness Pt is a 72 y.o. male with medical history significant of arthritis, CHF, CAD, DM2, HLD, HTN, treated melanoma, and spinal stenosis who presents for worsening cough, fatigue and SOB. MD assessment includes: Influenza A, hypoxic respiratory failure, hemothorax s/p L chest tube placement, AKI, acute on chronic anemia, hyperglycemia, type 2 NSTEMI related to hemothorax, blood loss, and tachycardia, and edema of the LE.   OT comments  [name] continues to make progress yet remains far from his baseline level of fxl mobility. During today's session he performed  multiple sit<>stand from recliner, always requiring Min A and needing to make multiple rocking attempts before coming up fully into standing. He demonstrated improvement (reduced # of rocks) with each attempt. He initially demonstrated poor eccentric control with sitting, but with subsequent sits was able to lower himself into the chair more slowly and steadily. Pt worked on increasing standing balance, during last attempt was able to remain in standing for ~ 5 minutes while weight shifting on LE. Pt declined ambulation, stating he felt too "weak" and "shaky" at this time. Will continue to follow PoC. Given that pt is significantly off his PLOF, continue to recommend DC to a SNF.   Recommendations for follow up therapy are one component of a multi-disciplinary discharge planning process, led by the attending physician.  Recommendations may be updated based on patient status, additional functional criteria and insurance authorization.    Follow Up Recommendations  Skilled nursing-short term rehab (<3 hours/day)     Assistance Recommended at Discharge Frequent or constant Supervision/Assistance  Patient can return home with the following  A lot of help with bathing/dressing/bathroom;A lot of help  with walking and/or transfers;Assistance with cooking/housework;Assist for transportation;Help with stairs or ramp for entrance   Equipment Recommendations       Recommendations for Other Services      Precautions / Restrictions Precautions Precautions: Fall Restrictions Weight Bearing Restrictions: No Other Position/Activity Restrictions: L chest tube       Mobility Bed Mobility Overal bed mobility: Needs Assistance             General bed mobility comments: received, left in recliner    Transfers Overall transfer level: Needs assistance Equipment used: Rolling walker (2 wheels) Transfers: Sit to/from Stand Sit to Stand: Min assist           General transfer comment: Little physical assistance from therapist required, although pt had to make multiple rocking attempts before coming fully upright     Balance Overall balance assessment: Needs assistance Sitting-balance support: Feet supported Sitting balance-Leahy Scale: Good     Standing balance support: Bilateral upper extremity supported, During functional activity, Reliant on assistive device for balance Standing balance-Leahy Scale: Fair Standing balance comment: fatigues quickly in standing                           ADL either performed or assessed with clinical judgement   ADL                                              Extremity/Trunk Assessment Upper Extremity Assessment Upper Extremity Assessment: Generalized weakness   Lower Extremity Assessment Lower Extremity Assessment: Generalized weakness  Vision       Perception     Praxis      Cognition Arousal/Alertness: Awake/alert Behavior During Therapy: WFL for tasks assessed/performed Overall Cognitive Status: Within Functional Limits for tasks assessed                                          Exercises Other Exercises Other Exercises: Educ re: PoC, DC recs, home safety     Shoulder Instructions       General Comments      Pertinent Vitals/ Pain       Pain Assessment Pain Assessment: Faces Faces Pain Scale: Hurts a little bit Pain Location: R lower leg, foot Pain Descriptors / Indicators: Discomfort, Sore, Guarding Pain Intervention(s): Limited activity within patient's tolerance, Repositioned  Home Living                                          Prior Functioning/Environment              Frequency  Min 2X/week        Progress Toward Goals  OT Goals(current goals can now be found in the care plan section)  Progress towards OT goals: Progressing toward goals  Acute Rehab OT Goals OT Goal Formulation: With patient Time For Goal Achievement: 04/29/22 Potential to Achieve Goals: Good  Plan Discharge plan remains appropriate;Frequency remains appropriate    Co-evaluation                 AM-PAC OT "6 Clicks" Daily Activity     Outcome Measure   Help from another person eating meals?: None Help from another person taking care of personal grooming?: A Little Help from another person toileting, which includes using toliet, bedpan, or urinal?: A Lot Help from another person bathing (including washing, rinsing, drying)?: A Lot Help from another person to put on and taking off regular upper body clothing?: A Little Help from another person to put on and taking off regular lower body clothing?: A Lot 6 Click Score: 16    End of Session Equipment Utilized During Treatment: Rolling walker (2 wheels);Oxygen  OT Visit Diagnosis: Other abnormalities of gait and mobility (R26.89);Muscle weakness (generalized) (M62.81)   Activity Tolerance Patient tolerated treatment well;Patient limited by fatigue   Patient Left in chair;with call bell/phone within reach;with family/visitor present   Nurse Communication          Time: 0272-5366 OT Time Calculation (min): 20 min  Charges: OT General Charges $OT Visit: 1  Visit OT Treatments $Self Care/Home Management : 8-22 mins Josiah Lobo, PhD, MS, OTR/L 04/17/22, 2:58 PM

## 2022-04-18 ENCOUNTER — Inpatient Hospital Stay: Payer: Medicare HMO

## 2022-04-18 DIAGNOSIS — J101 Influenza due to other identified influenza virus with other respiratory manifestations: Secondary | ICD-10-CM | POA: Diagnosis not present

## 2022-04-18 DIAGNOSIS — I509 Heart failure, unspecified: Secondary | ICD-10-CM

## 2022-04-18 LAB — CBC
HCT: 22.5 % — ABNORMAL LOW (ref 39.0–52.0)
Hemoglobin: 7.2 g/dL — ABNORMAL LOW (ref 13.0–17.0)
MCH: 28.5 pg (ref 26.0–34.0)
MCHC: 32 g/dL (ref 30.0–36.0)
MCV: 88.9 fL (ref 80.0–100.0)
Platelets: 192 10*3/uL (ref 150–400)
RBC: 2.53 MIL/uL — ABNORMAL LOW (ref 4.22–5.81)
RDW: 16.1 % — ABNORMAL HIGH (ref 11.5–15.5)
WBC: 5 10*3/uL (ref 4.0–10.5)
nRBC: 0 % (ref 0.0–0.2)

## 2022-04-18 LAB — BASIC METABOLIC PANEL
Anion gap: 8 (ref 5–15)
BUN: 74 mg/dL — ABNORMAL HIGH (ref 8–23)
CO2: 27 mmol/L (ref 22–32)
Calcium: 7.7 mg/dL — ABNORMAL LOW (ref 8.9–10.3)
Chloride: 103 mmol/L (ref 98–111)
Creatinine, Ser: 1.84 mg/dL — ABNORMAL HIGH (ref 0.61–1.24)
GFR, Estimated: 38 mL/min — ABNORMAL LOW (ref >60)
Glucose, Bld: 134 mg/dL — ABNORMAL HIGH (ref 70–99)
Potassium: 3.6 mmol/L (ref 3.5–5.1)
Sodium: 138 mmol/L (ref 135–145)

## 2022-04-18 LAB — GLUCOSE, CAPILLARY
Glucose-Capillary: 114 mg/dL — ABNORMAL HIGH (ref 70–99)
Glucose-Capillary: 169 mg/dL — ABNORMAL HIGH (ref 70–99)
Glucose-Capillary: 302 mg/dL — ABNORMAL HIGH (ref 70–99)

## 2022-04-18 LAB — PHOSPHORUS: Phosphorus: 2.9 mg/dL (ref 2.5–4.6)

## 2022-04-18 LAB — MAGNESIUM: Magnesium: 2.2 mg/dL (ref 1.7–2.4)

## 2022-04-18 MED ORDER — BISACODYL 10 MG RE SUPP: 10.0000 mg | Freq: Every day | RECTAL | Status: DC | PRN

## 2022-04-18 MED ORDER — BISACODYL 5 MG PO TBEC
10.0000 mg | DELAYED_RELEASE_TABLET | Freq: Every day | ORAL | Status: DC
  Administered 2022-04-18 – 2022-04-22 (×5): 10 mg via ORAL
  Filled 2022-04-18 (×6): qty 2

## 2022-04-18 MED ORDER — POLYETHYLENE GLYCOL 3350 17 G PO PACK
17.0000 g | PACK | Freq: Two times a day (BID) | ORAL | Status: DC
  Administered 2022-04-18 – 2022-04-24 (×11): 17 g via ORAL
  Filled 2022-04-18 (×15): qty 1

## 2022-04-18 NOTE — Progress Notes (Signed)
SUBJECTIVE: Patient is feeling much better less short of breath compared to on admission.   Vitals:   04/18/22 0353 04/18/22 0404 04/18/22 0754 04/18/22 1155  BP: (!) 124/56  (!) 123/58 121/62  Pulse: 76  73 80  Resp:   19 16  Temp: 98.7 F (37.1 C)  (!) 97.4 F (36.3 C) (!) 97.5 F (36.4 C)  TempSrc: Oral     SpO2: 100%  99% 100%  Weight:  125.2 kg    Height:        Intake/Output Summary (Last 24 hours) at 04/18/2022 1219 Last data filed at 04/18/2022 0754 Gross per 24 hour  Intake 735.62 ml  Output 2625 ml  Net -1889.38 ml    LABS: Basic Metabolic Panel: Recent Labs    04/17/22 0443 04/18/22 0455  NA 137 138  K 4.0 3.6  CL 102 103  CO2 29 27  GLUCOSE 176* 134*  BUN 77* 74*  CREATININE 2.03* 1.84*  CALCIUM 7.8* 7.7*  MG 2.3 2.2  PHOS 3.6 2.9   Liver Function Tests: No results for input(s): "AST", "ALT", "ALKPHOS", "BILITOT", "PROT", "ALBUMIN" in the last 72 hours. No results for input(s): "LIPASE", "AMYLASE" in the last 72 hours. CBC: Recent Labs    04/17/22 0443 04/18/22 0455  WBC 6.0 5.0  HGB 7.2* 7.2*  HCT 22.4* 22.5*  MCV 89.6 88.9  PLT 182 192   Cardiac Enzymes: No results for input(s): "CKTOTAL", "CKMB", "CKMBINDEX", "TROPONINI" in the last 72 hours. BNP: Invalid input(s): "POCBNP" D-Dimer: No results for input(s): "DDIMER" in the last 72 hours. Hemoglobin A1C: No results for input(s): "HGBA1C" in the last 72 hours. Fasting Lipid Panel: No results for input(s): "CHOL", "HDL", "LDLCALC", "TRIG", "CHOLHDL", "LDLDIRECT" in the last 72 hours. Thyroid Function Tests: No results for input(s): "TSH", "T4TOTAL", "T3FREE", "THYROIDAB" in the last 72 hours.  Invalid input(s): "FREET3" Anemia Panel: No results for input(s): "VITAMINB12", "FOLATE", "FERRITIN", "TIBC", "IRON", "RETICCTPCT" in the last 72 hours.   PHYSICAL EXAM General: Well developed, well nourished, in no acute distress HEENT:  Normocephalic and atramatic Neck:  No JVD.  Lungs:  Clear bilaterally to auscultation and percussion. Heart: HRRR . Normal S1 and S2 without gallops or murmurs.  Abdomen: Bowel sounds are positive, abdomen soft and non-tender  Msk:  Back normal, normal gait. Normal strength and tone for age. Extremities: No clubbing, cyanosis or edema.   Neuro: Alert and oriented X 3. Psych:  Good affect, responds appropriately  TELEMETRY: Normal sinus rhythm  ASSESSMENT AND PLAN: Congestive heart failure with history of coronary artery disease status post PCI/stenting of the mid LAD in 2001, presented with shortness of breath and respiratory failure due to influenza A and left pleural effusion requiring chest tube placement.  Patient is somewhat feeling better with less shortness of breath but still having hemoptysis.  Influenza A occurred 2 weeks ago probably not infectious at this time.  He had elevated troponins most likely due to demand ischemia has not had any chest pain and EKG was unremarkable.  Continue current treatment will continue to follow the patient closely.  Principal Problem:   Influenza A Active Problems:   Iron deficiency anemia   Diabetes mellitus type 2 in obese (HCC)   CAD (coronary artery disease)   Essential (primary) hypertension   Hyperlipidemia, mixed   Obesity, Class III, BMI 40-49.9 (morbid obesity) ([place])   Chronic kidney disease due to type 2 diabetes mellitus (HCC)   Edema of lower extremity   Acute on  chronic systolic CHF (congestive heart failure) (HCC)   Acute renal failure superimposed on stage 2 chronic kidney disease ([place])   Recurrent left pleural effusion   Acute hypoxemic respiratory failure (HCC)   Chronic membranous glomerulonephritis    Neoma Laming, MD, [facility] 04/18/2022 12:19 PM

## 2022-04-18 NOTE — Progress Notes (Signed)
PULMONOLOGY         Date: 04/18/2022,   MRN# [id number] [name] [birth year]     AdmissionWeight: (!) 138.7 kg                 CurrentWeight: 125.2 kg  Referring provider: Dr Roosevelt Locks   CHIEF COMPLAINT:   Parapneumonic effusion s/p chest tube placement    HISTORY OF PRESENT ILLNESS   This is a pleasant 72 year old male with severe morbid obesity chronic 3+ weeping lower extremity edema due to CKD with Membranous glomerulonephritis disease and recent evaluation with nephrology for rituximab infusion, history of CHF with reduced EF at 40%, osteoarthritis, diabetes, history of melanoma, neuropathy, spinal stenosis with chief complaint of shortness of breath and chest discomfort.  Patient is well-known to our pulmonology service with [facility] clinic we recently saw him for acute on chronic hypoxemic respiratory failure noted to have left-sided large effusion and right-sided mild to moderate effusion.  Status postthoracentesis x 2 with over 1 L drained.  This is a third time that he is shown to have a large pleural effusion in stent 3 months.  He did receive pleural drain upon arrival to the ER with 1500 cc serosanguineous return overnight and active drainage this a.m. and up to 150 cc before lunchtime.  On interview and physical exam patient reports slight improvement mild chest discomfort at pigtail insertion site.  Chest tube management displays no airleak upon forced expiratory maneuver.   04/14/22- patient examined at bedside, s/p repeat CBC with severe anemia requiring PRBC transfusion. S/p [place] abd no ascites. Chest tube in place on left , persistent and recurrent effusion present on CXR 1/28.  Will repeat CXR again today.  On Lasix gtt with 600cc urine overnight. Development of transient hypotension with req for midodrine.   04/15/22-  patient reports nausea. He is on oxycodone.  He appears less swollen today. Overall he is net 1.3L negative and has active fluid per Chest  tube. He needs to start using incentive spirometer today.    04/16/22- patient had 150cc this am per chest tube.  Patient reports improvement clinically.  Continue chest tube for now.   04/17/22- patient on 2L/min nasal canula.  He still has straw colored phlegm returning from chest tube.  He coughed up phlegm with blood/rusty colored. He had obstructiive urophathy and had foley placed feels a lot better now. He is constipated but did have BM today shares he is much improved.  04/18/22- patient is resting in bed.  Still has straw colored return overnight >250cc which will likely continue.  He had cardiology DR [name] come to evaluate him today.   Renal function seems to be improving and I will repeat cxr today for interval effusion changes.  PAST MEDICAL HISTORY   Past Medical History:  Diagnosis Date   Arthritis    CHF (congestive heart failure), NYHA class II, acute on chronic, systolic (HCC)    EF 43% 03/5398   Coronary artery disease    [facility]   Diabetes mellitus without complication ([place])    Dysplastic nevus 08/26/2020   L flank, moderate atypia   Hyperlipidemia    Hypertension    Melanoma ([place]) 08/20/2015   Right neck. Superficial spreading, arising in nevus. Tumor thickness 1.49m, Anatomic level IV   Melanoma in situ [place] 05/22/2020   R lateral neck ant to scar, exc 05/22/20   Numbness in right leg    due to back   Skin  cancer    removed l arm   Spinal stenosis    Stented coronary artery 2000   X 1 STENT   Swelling of both lower extremities      SURGICAL HISTORY   Past Surgical History:  Procedure Laterality Date   CATARACT EXTRACTION W/PHACO Right 05/03/2019   Procedure: CATARACT EXTRACTION PHACO AND INTRAOCULAR LENS PLACEMENT (IOC) RIGHT DIABETIC 6.44 00:58.4 11.0%;  Surgeon: Leandrew Koyanagi, MD;  Location: [facility] [place];  Service: Ophthalmology;  Laterality: Right;  DIABETIC   CATARACT EXTRACTION W/PHACO Left 06/28/2019   Procedure:  CATARACT EXTRACTION PHACO AND INTRAOCULAR LENS PLACEMENT (IOC) LEFT DIABETIC 7.99  01:13.4  10.9% ;  Surgeon: Leandrew Koyanagi, MD;  Location: [place];  Service: Ophthalmology;  Laterality: Left;  Diabetic - insulin and oral meds   CHOLECYSTECTOMY     JOINT REPLACEMENT     RT KNEE   LUMBAR LAMINECTOMY/DECOMPRESSION MICRODISCECTOMY  04/13/2012   Procedure: LUMBAR LAMINECTOMY/DECOMPRESSION MICRODISCECTOMY 1 LEVEL;  Surgeon: Johnn Hai, MD;  Location: [facility];  Service: Orthopedics;  Laterality: Bilateral;  L4-L5   UVULECTOMY  1990's     FAMILY HISTORY   History reviewed. No pertinent family history.   SOCIAL HISTORY   Social History   Tobacco Use   Smoking status: Never   Smokeless tobacco: Never  Vaping Use   Vaping Use: Never used  Substance Use Topics   Alcohol use: Yes    Comment: rare   Drug use: No     MEDICATIONS    Home Medication:     Current Medication:  Current Facility-Administered Medications:    0.9 %  sodium chloride infusion (Manually program via Guardrails IV Fluids), , Intravenous, Once, Sharen Hones, MD   acetaminophen (TYLENOL) tablet 650 mg, 650 mg, Oral, Q6H PRN **OR** acetaminophen (TYLENOL) suppository 650 mg, 650 mg, Rectal, Q6H PRN, Sid Falcon, MD   ascorbic acid (VITAMIN C) tablet 500 mg, 500 mg, Oral, Daily, Val Riles, MD, 500 mg at 04/18/22 0913   atorvastatin (LIPITOR) tablet 80 mg, 80 mg, Oral, Daily, Gilles Chiquito B, MD, 80 mg at 04/18/22 0913   bisacodyl (DULCOLAX) EC tablet 10 mg, 10 mg, Oral, QHS, Val Riles, MD   bisacodyl (DULCOLAX) suppository 10 mg, 10 mg, Rectal, Daily PRN, Val Riles, MD   Chlorhexidine Gluconate Cloth 2 % PADS 6 each, 6 each, Topical, Daily, Val Riles, MD, 6 each at 04/18/22 0914   enoxaparin (LOVENOX) injection 70 mg, 0.5 mg/kg, Subcutaneous, Q24H, Gilles Chiquito B, MD, 70 mg at 04/18/22 0912   guaiFENesin (MUCINEX) 12 hr tablet 600 mg, 600 mg, Oral, BID, Val Riles, MD,  600 mg at 04/18/22 0914   guaiFENesin-dextromethorphan (ROBITUSSIN DM) 100-10 MG/5ML syrup 5 mL, 5 mL, Oral, Q4H PRN, Gilles Chiquito B, MD, 5 mL at 04/13/22 2154   HYDROmorphone (DILAUDID) injection 0.5-1 mg, 0.5-1 mg, Intravenous, Q2H PRN, Gilles Chiquito B, MD, 1 mg at 04/14/22 2136   insulin aspart (novoLOG) injection 0-15 Units, 0-15 Units, Subcutaneous, TID WC, Gilles Chiquito B, MD, 5 Units at 04/17/22 1726   insulin aspart (novoLOG) injection 0-5 Units, 0-5 Units, Subcutaneous, QHS, Gilles Chiquito B, MD, 2 Units at 04/17/22 2147   insulin glargine-yfgn (SEMGLEE) injection 25 Units, 25 Units, Subcutaneous, QHS, Sharen Hones, MD, 25 Units at 04/17/22 2148   iron polysaccharides (NIFEREX) capsule 150 mg, 150 mg, Oral, Daily, Val Riles, MD, 150 mg at 04/18/22 0914   methocarbamol (ROBAXIN) 500 mg in dextrose 5 % 50 mL IVPB, 500 mg,  Intravenous, Q6H PRN, Sid Falcon, MD   ondansetron [facility]) tablet 4 mg, 4 mg, Oral, Q6H PRN **OR** ondansetron (ZOFRAN) injection 4 mg, 4 mg, Intravenous, Q6H PRN, Gilles Chiquito B, MD, 4 mg at 04/17/22 0920   oxyCODONE (Oxy IR/ROXICODONE) immediate release tablet 5 mg, 5 mg, Oral, Q4H PRN, Gilles Chiquito B, MD, 5 mg at 04/16/22 1621   polyethylene glycol (MIRALAX / GLYCOLAX) packet 17 g, 17 g, Oral, BID, Val Riles, MD, 17 g at 04/18/22 0913   sodium chloride flush (NS) 0.9 % injection 3 mL, 3 mL, Intravenous, Q12H, Gilles Chiquito B, MD, 3 mL at 04/18/22 3154   spironolactone (ALDACTONE) tablet 50 mg, 50 mg, Oral, Daily, Breeze, Benancio Deeds, NP, 50 mg at 04/18/22 0913   tamsulosin (FLOMAX) capsule 0.4 mg, 0.4 mg, Oral, QPC supper, Val Riles, MD, 0.4 mg at 04/17/22 1702   torsemide (DEMADEX) tablet 100 mg, 100 mg, Oral, Daily, Breeze, Benancio Deeds, NP, 100 mg at 04/18/22 0086   Vitamin D (Ergocalciferol) (DRISDOL) 1.25 MG (50000 UNIT) capsule 50,000 Units, 50,000 Units, Oral, Q7 days, Val Riles, MD, 50,000 Units at 04/17/22 1228    ALLERGIES   Carvedilol,  Metoprolol, Toprol xl [metoprolol tartrate], Buprenorphine, Buprenorphine hcl, Morphine, Morphine and related, Triamcinolone, and Triamcinolone acetonide     REVIEW OF SYSTEMS    Review of Systems:  Gen:  Denies  fever, sweats, chills weigh loss  HEENT: Denies blurred vision, double vision, ear pain, eye pain, hearing loss, nose bleeds, sore throat Cardiac:  No dizziness, chest pain or heaviness, chest tightness,edema Resp:   reports dyspnea chronically  Gi: Denies swallowing difficulty, stomach pain, nausea or vomiting, diarrhea, constipation, bowel incontinence Gu:  Denies bladder incontinence, burning urine Ext:   Denies Joint pain, stiffness or swelling Skin: Denies  skin rash, easy bruising or bleeding or hives Endoc:  Denies polyuria, polydipsia , polyphagia or weight change Psych:   Denies depression, insomnia or hallucinations   Other:  All other systems negative   VS: BP (!) 123/58 (BP Location: Right Arm)   Pulse 73   Temp (!) 97.4 F (36.3 C)   Resp 19   Ht 6' (1.829 m)   Wt 125.2 kg   SpO2 99%   BMI 37.43 kg/m      PHYSICAL EXAM    GENERAL:NAD, no fevers, chills, no weakness no fatigue HEAD: Normocephalic, atraumatic.  EYES: Pupils equal, round, reactive to light. Extraocular muscles intact. No scleral icterus.  MOUTH: Moist mucosal membrane. Dentition intact. No abscess noted.  EAR, NOSE, THROAT: Clear without exudates. No external lesions.  NECK: Supple. No thyromegaly. No nodules. No JVD.  PULMONARY: decreased breath sounds with mild rhonchi worse at bases bilaterally.  CARDIOVASCULAR: S1 and S2. Regular rate and rhythm. No murmurs, rubs, or gallops. No edema. Pedal pulses 2+ bilaterally.  GASTROINTESTINAL: Soft, nontender, nondistended. No masses. Positive bowel sounds. No hepatosplenomegaly.  MUSCULOSKELETAL: No swelling, clubbing, or edema. Range of motion full in all extremities.  NEUROLOGIC: Cranial nerves II through XII are intact. No gross focal  neurological deficits. Sensation intact. Reflexes intact.  SKIN: No ulceration, lesions, rashes, or cyanosis. Skin warm and dry. Turgor intact.  PSYCHIATRIC: Mood, affect within normal limits. The patient is awake, alert and oriented x 3. Insight, judgment intact.       IMAGING     ASSESSMENT/PLAN   Acute on chronic hypoxemic respiratory failure Present on admission due to influenza A infection with recurrent left pleural effusion and associated compressive atelectasis -Patient's evaluated by  nephrology and is status post Lasix drip as well as pigtail chest tube on the left with 400cc serosanguineous fluid overnight and active drain during examination today -Is no air leak upon forced aspiratory maneuver, chest tube is not clogged and fluid is freely flowing. -Fluid was previously tested and was noted to be lymphocyte predominant fluid and became exudative upon initiation of diuretics which is known to happen.  Does not appear to be empyema and I do not think he is a very good candidate for intrapleural thrombolytics.  Patient may be heading towards dialysis but if not he may also benefit from thoracic surgery evaluation and possible pleurodesis.     CKD - per nephro    Rutixan plan at some point.    Thank you for allowing me to participate in the care of this patient.   Patient/Family are satisfied with care plan and all questions have been answered.    Provider disclosure: Patient with at least one acute or chronic illness or injury that poses a threat to life or bodily function and is being managed actively during this encounter.  All of the below services have been performed independently by signing provider:  review of prior documentation from internal and or external health records.  Review of previous and current lab results.  Interview and comprehensive assessment during patient visit today. Review of current and previous chest radiographs/CT scans. Discussion of management and  test interpretation with health care team and patient/family.   This document was prepared using Dragon voice recognition software and may include unintentional dictation errors.     Ottie Glazier, M.D.  Division of Pulmonary & Critical Care Medicine

## 2022-04-18 NOTE — Progress Notes (Signed)
[facility]  ROUNDING NOTE   Subjective:  Patient well-known to [place] as we follow him for idiopathic membranous glomerulonephritis with nephrotic syndrome, chronic kidney disease stage II with diabetes mellitus type 2, proteinuria, and lower extremity edema.  Patient came in with significant shortness of breath.  Found to be positive for the flu.  Has a left-sided chest tube in place.  Baseline creatinine 1.15 with EGFR of 67.  Most recent urine protein to creatinine ratio was down to 4.9.  He was to be evaluated by hematology for treatment with rituximab.  Patient seen sitting up in bed Alert and oriented States he feels his edema has improved since admission Remains on 2 L nasal cannula Went to oral diuretics yesterday. Continues to have bloody sputum  Objective:  Vital signs in last 24 hours:  Temp:  [97.4 F (36.3 C)-99.5 F (37.5 C)] 97.5 F (36.4 C) (02/03 1155) Pulse Rate:  [42-80] 80 (02/03 1155) Resp:  [16-21] 16 (02/03 1155) BP: (121-140)/(51-62) 121/62 (02/03 1155) SpO2:  [99 %-100 %] 100 % (02/03 1155) Weight:  [125.2 kg] 125.2 kg (02/03 0404)  Weight change: -3.531 kg Filed Weights   04/16/22 1429 04/17/22 0356 04/18/22 0404  Weight: 128.7 kg 127.3 kg 125.2 kg    Intake/Output: I/O last 3 completed shifts: In: 1267.5 [P.O.:1080; I.V.:153.8; IV Piggyback:33.7] Out: 2878 [MVEHM:0947; Chest Tube:130]   Intake/Output this shift:  No intake/output data recorded.  Physical Exam: General: NAD  Head: Normocephalic, atraumatic. Moist oral mucosal membranes  Lungs:  Coarse, normal effort  Heart: S1S2 no rubs  Abdomen:  Soft, nontender, bowel sounds present  Extremities: 1+ brawny peripheral edema.  Neurologic: Awake, alert, following commands  Skin: No acute rash    Basic Metabolic Panel: Recent Labs  Lab 04/14/22 0506 04/15/22 0235 04/16/22 0445 04/17/22 0443 04/18/22 0455  NA 132* 135 137 137 138  K 3.8 3.8 3.5 4.0 3.6  CL 100 99 101 102  103  CO2 '26 29 26 29 27  '$ GLUCOSE 141* 148* 114* 176* 134*  BUN 82* 83* 83* 77* 74*  CREATININE 2.16* 2.26* 2.08* 2.03* 1.84*  CALCIUM 7.4* 7.6* 7.7* 7.8* 7.7*  MG 2.4 2.4 2.5* 2.3 2.2  PHOS  --   --  4.6 3.6 2.9     Liver Function Tests: Recent Labs  Lab 04/11/22 2033 04/12/22 0500 04/15/22 0235  AST '23 24 20  '$ ALT '15 14 11  '$ ALKPHOS 72 60 60  BILITOT 1.4* 1.0 1.2  PROT 5.9* 5.8* 5.6*  ALBUMIN 2.5* 2.4* 2.9*    No results for input(s): "LIPASE", "AMYLASE" in the last 168 hours. No results for input(s): "AMMONIA" in the last 168 hours.  CBC: Recent Labs  Lab 04/11/22 2033 04/12/22 0500 04/14/22 0506 04/15/22 0235 04/16/22 0445 04/17/22 0443 04/18/22 0455  WBC 5.3   < > 3.9* 5.1 4.1 6.0 5.0  NEUTROABS 4.5  --   --   --   --   --   --   HGB 8.1*   < > 6.9* 7.7* 7.2* 7.2* 7.2*  HCT 25.8*   < > 21.4* 24.3* 22.0* 22.4* 22.5*  MCV 89.9   < > 88.4 88.4 88.4 89.6 88.9  PLT 193   < > 180 204 196 182 192   < > = values in this interval not displayed.     Cardiac Enzymes: No results for input(s): "CKTOTAL", "CKMB", "CKMBINDEX", "TROPONINI" in the last 168 hours.  BNP: Invalid input(s): "POCBNP"  CBG: Recent Labs  Lab 04/17/22 1553 04/17/22 2043 04/17/22 2140 04/18/22 0755 04/18/22 1143  GLUCAP 232* 275* 233* 114* 169*     Microbiology: Results for orders placed or performed during the hospital encounter of 04/11/22  Resp panel by RT-PCR (RSV, Flu A&B, Covid) Anterior Nasal Swab     Status: Abnormal   Collection Time: 04/11/22  8:34 PM   Specimen: Anterior Nasal Swab  Result Value Ref Range Status   SARS Coronavirus 2 by RT PCR NEGATIVE NEGATIVE Final    Comment: (NOTE) SARS-CoV-2 target nucleic acids are NOT DETECTED.  The SARS-CoV-2 RNA is generally detectable in upper respiratory specimens during the acute phase of infection. The lowest concentration of SARS-CoV-2 viral copies this assay can detect is 138 copies/mL. A negative result does not  preclude SARS-Cov-2 infection and should not be used as the sole basis for treatment or other patient management decisions. A negative result may occur with  improper specimen collection/handling, submission of specimen other than nasopharyngeal swab, presence of viral mutation(s) within the areas targeted by this assay, and inadequate number of viral copies(<138 copies/mL). A negative result must be combined with clinical observations, patient history, and epidemiological information. The expected result is Negative.  Fact Sheet for Patients:  EntrepreneurPulse.com.au  Fact Sheet for Healthcare Providers:  IncredibleEmployment.be  This test is no t yet approved or cleared by the [place] FDA and  has been authorized for detection and/or diagnosis of SARS-CoV-2 by FDA under an Emergency Use Authorization (EUA). This EUA will remain  in effect (meaning this test can be used) for the duration of the COVID-19 declaration under Section 564(b)(1) of the Act, 21 U.S.C.section 360bbb-3(b)(1), unless the authorization is terminated  or revoked sooner.       Influenza A by PCR POSITIVE (A) NEGATIVE Final   Influenza B by PCR NEGATIVE NEGATIVE Final    Comment: (NOTE) The Xpert Xpress SARS-CoV-2/FLU/RSV plus assay is intended as an aid in the diagnosis of influenza from Nasopharyngeal swab specimens and should not be used as a sole basis for treatment. Nasal washings and aspirates are unacceptable for Xpert Xpress SARS-CoV-2/FLU/RSV testing.  Fact Sheet for Patients: EntrepreneurPulse.com.au  Fact Sheet for Healthcare Providers: IncredibleEmployment.be  This test is not yet approved or cleared by the [place] FDA and has been authorized for detection and/or diagnosis of SARS-CoV-2 by FDA under an Emergency Use Authorization (EUA). This EUA will remain in effect (meaning this test can be used) for the  duration of the COVID-19 declaration under Section 564(b)(1) of the Act, 21 U.S.C. section 360bbb-3(b)(1), unless the authorization is terminated or revoked.     Resp Syncytial Virus by PCR NEGATIVE NEGATIVE Final    Comment: (NOTE) Fact Sheet for Patients: EntrepreneurPulse.com.au  Fact Sheet for Healthcare Providers: IncredibleEmployment.be  This test is not yet approved or cleared by the [place] FDA and has been authorized for detection and/or diagnosis of SARS-CoV-2 by FDA under an Emergency Use Authorization (EUA). This EUA will remain in effect (meaning this test can be used) for the duration of the COVID-19 declaration under Section 564(b)(1) of the Act, 21 U.S.C. section 360bbb-3(b)(1), unless the authorization is terminated or revoked.  Performed at [facility], [address] [place], [place] [address]   Culture, blood (routine x 2)     Status: None   Collection Time: 04/11/22  9:59 PM   Specimen: BLOOD LEFT ARM  Result Value Ref Range Status   Specimen Description BLOOD LEFT ARM  Final   Special  Requests   Final    BOTTLES DRAWN AEROBIC AND ANAEROBIC Blood Culture adequate volume   Culture   Final    NO GROWTH 5 DAYS Performed at [facility], [address] [place], [place] [address]    Report Status 04/16/2022 FINAL  Final  Culture, blood (routine x 2)     Status: None   Collection Time: 04/11/22 10:01 PM   Specimen: BLOOD LEFT HAND  Result Value Ref Range Status   Specimen Description BLOOD LEFT HAND  Final   Special Requests   Final    BOTTLES DRAWN AEROBIC AND ANAEROBIC Blood Culture results may not be optimal due to an inadequate volume of blood received in culture bottles   Culture   Final    NO GROWTH 5 DAYS Performed at [facility], [address], [place],  [address]    Report Status 04/16/2022 FINAL  Final    Coagulation Studies: No results for input(s):  "LABPROT", "INR" in the last 72 hours.  Urinalysis: No results for input(s): "COLORURINE", "LABSPEC", "PHURINE", "GLUCOSEU", "HGBUR", "BILIRUBINUR", "KETONESUR", "PROTEINUR", "UROBILINOGEN", "NITRITE", "LEUKOCYTESUR" in the last 72 hours.  Invalid input(s): "APPERANCEUR"    Imaging: No results found.   Medications:    methocarbamol (ROBAXIN) IV      sodium chloride   Intravenous Once   vitamin C  500 mg Oral Daily   atorvastatin  80 mg Oral Daily   bisacodyl  10 mg Oral QHS   Chlorhexidine Gluconate Cloth  6 each Topical Daily   enoxaparin (LOVENOX) injection  0.5 mg/kg Subcutaneous Q24H   guaiFENesin  600 mg Oral BID   insulin aspart  0-15 Units Subcutaneous TID WC   insulin aspart  0-5 Units Subcutaneous QHS   insulin glargine-yfgn  25 Units Subcutaneous QHS   iron polysaccharides  150 mg Oral Daily   polyethylene glycol  17 g Oral BID   sodium chloride flush  3 mL Intravenous Q12H   spironolactone  50 mg Oral Daily   tamsulosin  0.4 mg Oral QPC supper   torsemide  100 mg Oral Daily   Vitamin D (Ergocalciferol)  50,000 Units Oral Q7 days   acetaminophen **OR** acetaminophen, bisacodyl, guaiFENesin-dextromethorphan, HYDROmorphone (DILAUDID) injection, methocarbamol (ROBAXIN) IV, ondansetron **OR** ondansetron (ZOFRAN) IV, oxyCODONE  Assessment/ Plan:  72 y.o. male with past medical history of diabetes mellitus type 2, morbid obesity, hypertension, coronary artery disease status post stent placement, cardiomyopathy, low testosterone, hyperlipidemia, history of first left toe osteomyelitis with amputation, hydronephrosis, osteoarthritis of the right hip, history of idiopathic membranous glomerulonephritis who presents with increasing shortness of breath.  1.  Idiopathic membranous glomerulonephritis/ Acute kidney injury on chronic kidney disease stage II/proteinuria most recent urine protein to creatinine ratio 4.9.  Patient was in the process of being evaluated for rituximab  treatment of his idiopathic membranous glomerulonephritis. -Hold off on administering rituximab at this time.  Will discuss with interdisciplinary team about possibly initiating oral treatments for underlying kidney failure, tacrolimus or other agents.  Will recheck consider rituximab once patient's health status improves. -Transition to oral diuretics, torsemide and spironolactone yesterday.  Currently tolerating well.  Creatinine continues to improve.  Urine output 2.6 L in preceding 24 hours.  2. Lower extremity edema. Improved with furosemide drip.  Transition back to oral diuretics.   3.  Anemia of chronic kidney disease.  Hemoglobin stable, 7.2.  4.  Acute on chronic combined systolic and diastolic CHF, acute hypoxemic respiratory failure, volume overload, coronary artery disease. Volume  management with Lasix drip as above. Recurrent left-sided pleural effusion secondary to nephrotic syndrome, third spacing of fluid.  Left chest tube remains in place.  5.  Influenza A Completed Tamiflu.    LOS: [place] 2/3/20241:17 PM

## 2022-04-18 NOTE — Progress Notes (Signed)
[facility] Progress Note  Patient: [name]    WGY:[id number]  DOA: 04/11/2022     Date of Service: the patient was seen and examined on 04/18/2022  Chief Complaint  Patient presents with   Shortness of Breath   Brief hospital course: [name] is a 72 y.o. male with PMH of arthritis, CHF (chronic), CAD, DM2, HLD, HTN, h/o treated melanoma, spinal stenosis who presents for worsening cough, fatigue and sob. Had low-grade fever, he was found to have positive for influenza A. He had recurrent left-sided pleural effusion, had a thoracentesis x 2 during the last months.  Upon arrival emergency room, chest x-ray showed large left-sided pleural effusion again, chest tube was placed by ED physician. Patient also developed significant hypoxemia with oxygen saturation as low as 76% on room air this morning.  Treated with oxygen. Patient has severe volume overload with evidence of bichamber heart failure.  Echocardiogram showed EF 45-50% with grade 2 diastolic dysfunction. Pulm valve not visualized.  Furosemide drip started after seen by nephrology.   1/29.  Patient had minimal urine after Lasix, but chest tube had drained additional 1500 mL in 24 hours.    Assessment and Plan: Acute on chronic combined systolic and diastolic congestive heart failure Acute hypoxemic respiratory failure secondary to volume overload. Elevated troponin with NSTEMI. Coronary artery disease. Continue supplemental O2 nation and gradually wean off Biventricular heart failure, significant volume overload, edema of bilateral lower extremities, elevated BNP Abdominal ultrasound did not show any ascites. Nephro and cardiology consulted S/p Lasix IV infusion, discontinued on 2/2, started torsemide 180 mg p.o. daily Good urine output after Foley catheter insertion   Recurrent left side pleural effusion.  Hemothorax ruled out. Chest tube was placed in the emergency room, removed 2 L of serous fluids.  No  evidence of bleeding.  Based on prior lab test, cytology was negative.  Prior test also does not indicate infection.  Reviewed chest x-ray post chest tube, no evidence of pneumonia. Procalcitonin level not significant elevated at 0.2 with chronic renal function.  No evidence of infection.  Patient is followed by pulmonology, patient appears to have rapid accumulation of pleural fluid.  Had a discussion with CT surgery, pulmonology and nephrology.  Patient still has a large amount of the drain from the chest tube, patient cannot be discharged with chest tube in place.  The source of increased pleural effusion is due to heart failure, third spacing from renal disease.  Patient will be given albumin, diuretics.  If this does not improve, we may have to use Pleurx catheter. 2/3 follow repeat chest x-ray  Influenza A. S/p Tamiflu for 5 days, last dose on 04/16/22 2/1 started Mucinex 600 mg p.o. twice daily due to chest congestion   Acute kidney injury on chronic kidney disease stage II. Idiopathic membranous glomerulonephritis. Hyponatremia. Patient has a worsening renal function on chronic renal disease.  He is grossly volume overloaded, he is started on Lasix drip.  Renal function is gradually getting worse.  There is a possibility that patient may need a dialysis. Nephrology is working with oncology to start rituximab.    Iron deficient anemia. Acute blood loss anemia ruled out. Patient received IV iron, hemoglobin dropped down to 6.9, chest tube fluids does not seem to be bloody.  s/p 1 unit PRBC.  No evidence of GI bleed. Hb 7.2  Continue to monitor H&H and transfuse if hemoglobin less than 7   Uncontrolled type 2 diabetes with hyperglycemia Continue  long-acting insulin, sliding scale insulin.   Essential hypertension Continue to monitor.   Morbid obesity BMI 41.47, Likely obstructive sleep apnea. Diet exercise. Will decide if patient need BiPAP before discharge.   Urinary retention,  Foley catheter was inserted on 04/16/2022 Started Flomax Continue Foley catheter for 1 week and then DC when patient is more ambulatory  Body mass index is 41.47 kg/m.  Interventions:       Diet: Carb modified diet DVT Prophylaxis: Subcutaneous Lovenox   Advance goals of care discussion: Full code  Family Communication: family was not present at bedside, at the time of interview.  The pt provided permission to discuss medical plan with the family. Opportunity was given to ask question and all questions were answered satisfactorily.   Disposition:  Pt is from Home, admitted with anasarca and left pleural effusion, still has left-sided chest tube, which precludes a safe discharge. Discharge to home, when clinically stable, needs pulmonary clearance.  Subjective: No significant events overnight, still patient is having productive cough, rusty/dark-colored phlegm noticed.  Overall feeling better, feels that edema is also improving.  Had any active issues, no chest pain or palpitation.  Physical Exam: General: NAD, lying comfortably Appear in no distress, affect appropriate Eyes: PERRLA ENT: Oral Mucosa Clear, moist  Neck: no JVD,  Cardiovascular: S1 and S2 Present, no Murmur,  Respiratory: good respiratory effort, Bilateral Air entry equal and Decreased, bilateral crackles, decreased breath sounds in the left lower chest, no wheezing, left chest tube intact Abdomen: Bowel Sound present, Soft and no tenderness,  Skin: no rashes Extremities: 2-3+ Pedal edema, no calf tenderness.  Edema is gradually improving Neurologic: without any new focal findings Gait not checked due to patient safety concerns  Vitals:   04/18/22 0353 04/18/22 0404 04/18/22 0754 04/18/22 1155  BP: (!) 124/56  (!) 123/58 121/62  Pulse: 76  73 80  Resp:   19 16  Temp: 98.7 F (37.1 C)  (!) 97.4 F (36.3 C) (!) 97.5 F (36.4 C)  TempSrc: Oral     SpO2: 100%  99% 100%  Weight:  125.2 kg    Height:         Intake/Output Summary (Last 24 hours) at 04/18/2022 1338 Last data filed at 04/18/2022 0754 Gross per 24 hour  Intake 495.62 ml  Output 2625 ml  Net -2129.38 ml   Filed Weights   04/16/22 1429 04/17/22 0356 04/18/22 0404  Weight: 128.7 kg 127.3 kg 125.2 kg    Data Reviewed: I have personally reviewed and interpreted daily labs, tele strips, imagings as discussed above. I reviewed all nursing notes, pharmacy notes, vitals, pertinent old records I have discussed plan of care as described above with RN and patient/family.  CBC: Recent Labs  Lab 04/11/22 2033 04/12/22 0500 04/14/22 0506 04/15/22 0235 04/16/22 0445 04/17/22 0443 04/18/22 0455  WBC 5.3   < > 3.9* 5.1 4.1 6.0 5.0  NEUTROABS 4.5  --   --   --   --   --   --   HGB 8.1*   < > 6.9* 7.7* 7.2* 7.2* 7.2*  HCT 25.8*   < > 21.4* 24.3* 22.0* 22.4* 22.5*  MCV 89.9   < > 88.4 88.4 88.4 89.6 88.9  PLT 193   < > 180 204 196 182 192   < > = values in this interval not displayed.   Basic Metabolic Panel: Recent Labs  Lab 04/14/22 0506 04/15/22 0235 04/16/22 0445 04/17/22 0443 04/18/22 0455  NA 132*  135 137 137 138  K 3.8 3.8 3.5 4.0 3.6  CL 100 99 101 102 103  CO2 '26 29 26 29 27  '$ GLUCOSE 141* 148* 114* 176* 134*  BUN 82* 83* 83* 77* 74*  CREATININE 2.16* 2.26* 2.08* 2.03* 1.84*  CALCIUM 7.4* 7.6* 7.7* 7.8* 7.7*  MG 2.4 2.4 2.5* 2.3 2.2  PHOS  --   --  4.6 3.6 2.9    Studies: DG Chest Port 1 View  Result Date: 04/18/2022 CLINICAL DATA:  Follow-up left pleural effusion. EXAM: PORTABLE CHEST 1 VIEW COMPARISON:  04/15/2022 FINDINGS: Left-sided chest tube is in place and appears unchanged in position from previous exam. No pneumothorax identified. Unchanged opacification of the left lower lung which likely represents a combination of pleural effusion, airspace disease and atelectasis. Right lung appears clear IMPRESSION: 1. Stable position of left chest tube. No pneumothorax. 2. No change in aeration to the left lower  lung. Electronically Signed   By: Kerby Moors M.D.   On: 04/18/2022 13:28    Scheduled Meds:  sodium chloride   Intravenous Once   vitamin C  500 mg Oral Daily   atorvastatin  80 mg Oral Daily   bisacodyl  10 mg Oral QHS   Chlorhexidine Gluconate Cloth  6 each Topical Daily   enoxaparin (LOVENOX) injection  0.5 mg/kg Subcutaneous Q24H   guaiFENesin  600 mg Oral BID   insulin aspart  0-15 Units Subcutaneous TID WC   insulin aspart  0-5 Units Subcutaneous QHS   insulin glargine-yfgn  25 Units Subcutaneous QHS   iron polysaccharides  150 mg Oral Daily   polyethylene glycol  17 g Oral BID   sodium chloride flush  3 mL Intravenous Q12H   spironolactone  50 mg Oral Daily   tamsulosin  0.4 mg Oral QPC supper   torsemide  100 mg Oral Daily   Vitamin D (Ergocalciferol)  50,000 Units Oral Q7 days   Continuous Infusions:  methocarbamol (ROBAXIN) IV     PRN Meds: acetaminophen **OR** acetaminophen, bisacodyl, guaiFENesin-dextromethorphan, HYDROmorphone (DILAUDID) injection, methocarbamol (ROBAXIN) IV, ondansetron **OR** ondansetron (ZOFRAN) IV, oxyCODONE  Time spent: 35 minutes  Author: Val Riles. MD [facility] 04/18/2022 1:38 PM  To reach On-call, see care teams to locate the attending and reach out to them via [email or web address]. If 7PM-7AM, please contact night-coverage If you still have difficulty reaching the attending provider, please page the [facility] (Director on Call) for [facility] on amion for assistance.

## 2022-04-19 DIAGNOSIS — J101 Influenza due to other identified influenza virus with other respiratory manifestations: Secondary | ICD-10-CM | POA: Diagnosis not present

## 2022-04-19 LAB — BASIC METABOLIC PANEL
Anion gap: 8 (ref 5–15)
BUN: 64 mg/dL — ABNORMAL HIGH (ref 8–23)
CO2: 31 mmol/L (ref 22–32)
Calcium: 8 mg/dL — ABNORMAL LOW (ref 8.9–10.3)
Chloride: 102 mmol/L (ref 98–111)
Creatinine, Ser: 1.68 mg/dL — ABNORMAL HIGH (ref 0.61–1.24)
GFR, Estimated: 43 mL/min — ABNORMAL LOW (ref >60)
Glucose, Bld: 233 mg/dL — ABNORMAL HIGH (ref 70–99)
Potassium: 3.6 mmol/L (ref 3.5–5.1)
Sodium: 141 mmol/L (ref 135–145)

## 2022-04-19 LAB — CBC
HCT: 23.9 % — ABNORMAL LOW (ref 39.0–52.0)
Hemoglobin: 7.5 g/dL — ABNORMAL LOW (ref 13.0–17.0)
MCH: 28 pg (ref 26.0–34.0)
MCHC: 31.4 g/dL (ref 30.0–36.0)
MCV: 89.2 fL (ref 80.0–100.0)
Platelets: 212 10*3/uL (ref 150–400)
RBC: 2.68 MIL/uL — ABNORMAL LOW (ref 4.22–5.81)
RDW: 15.8 % — ABNORMAL HIGH (ref 11.5–15.5)
WBC: 5.2 10*3/uL (ref 4.0–10.5)
nRBC: 0 % (ref 0.0–0.2)

## 2022-04-19 LAB — GLUCOSE, CAPILLARY
Glucose-Capillary: 200 mg/dL — ABNORMAL HIGH (ref 70–99)
Glucose-Capillary: 209 mg/dL — ABNORMAL HIGH (ref 70–99)
Glucose-Capillary: 234 mg/dL — ABNORMAL HIGH (ref 70–99)
Glucose-Capillary: 230 mg/dL — ABNORMAL HIGH (ref 70–99)
Glucose-Capillary: 245 mg/dL — ABNORMAL HIGH (ref 70–99)

## 2022-04-19 MED ORDER — TACROLIMUS 1 MG PO CAPS
1.0000 mg | ORAL_CAPSULE | Freq: Two times a day (BID) | ORAL | Status: DC
  Administered 2022-04-19 – 2022-04-26 (×15): 1 mg via ORAL
  Filled 2022-04-19 (×17): qty 1

## 2022-04-19 MED ORDER — IPRATROPIUM-ALBUTEROL 0.5-2.5 (3) MG/3ML IN SOLN
3.0000 mL | Freq: Four times a day (QID) | RESPIRATORY_TRACT | Status: DC
  Administered 2022-04-19 – 2022-04-20 (×4): 3 mL via RESPIRATORY_TRACT
  Filled 2022-04-19 (×4): qty 3

## 2022-04-19 NOTE — Progress Notes (Signed)
SUBJECTIVE: Patient denies any chest pain or shortness of breath and is feeling much better   Vitals:   04/19/22 0400 04/19/22 0800 04/19/22 1123 04/19/22 1214  BP:  (!) 120/59  (!) 123/51  Pulse:  83  89  Resp:      Temp:  98.7 F (37.1 C)    TempSrc:      SpO2:  98% 100% 100%  Weight: 124.6 kg     Height:        Intake/Output Summary (Last 24 hours) at 04/19/2022 1308 Last data filed at 04/19/2022 0600 Gross per 24 hour  Intake 120 ml  Output 3850 ml  Net -3730 ml    LABS: Basic Metabolic Panel: Recent Labs    04/17/22 0443 04/18/22 0455 04/19/22 0532  NA 137 138 141  K 4.0 3.6 3.6  CL 102 103 102  CO2 '29 27 31  '$ GLUCOSE 176* 134* 233*  BUN 77* 74* 64*  CREATININE 2.03* 1.84* 1.68*  CALCIUM 7.8* 7.7* 8.0*  MG 2.3 2.2  --   PHOS 3.6 2.9  --    Liver Function Tests: No results for input(s): "AST", "ALT", "ALKPHOS", "BILITOT", "PROT", "ALBUMIN" in the last 72 hours. No results for input(s): "LIPASE", "AMYLASE" in the last 72 hours. CBC: Recent Labs    04/18/22 0455 04/19/22 0532  WBC 5.0 5.2  HGB 7.2* 7.5*  HCT 22.5* 23.9*  MCV 88.9 89.2  PLT 192 212   Cardiac Enzymes: No results for input(s): "CKTOTAL", "CKMB", "CKMBINDEX", "TROPONINI" in the last 72 hours. BNP: Invalid input(s): "POCBNP" D-Dimer: No results for input(s): "DDIMER" in the last 72 hours. Hemoglobin A1C: No results for input(s): "HGBA1C" in the last 72 hours. Fasting Lipid Panel: No results for input(s): "CHOL", "HDL", "LDLCALC", "TRIG", "CHOLHDL", "LDLDIRECT" in the last 72 hours. Thyroid Function Tests: No results for input(s): "TSH", "T4TOTAL", "T3FREE", "THYROIDAB" in the last 72 hours.  Invalid input(s): "FREET3" Anemia Panel: No results for input(s): "VITAMINB12", "FOLATE", "FERRITIN", "TIBC", "IRON", "RETICCTPCT" in the last 72 hours.   PHYSICAL EXAM General: Well developed, well nourished, in no acute distress HEENT:  Normocephalic and atramatic Neck:  No JVD.  Lungs:  Clear bilaterally to auscultation and percussion. Heart: HRRR . Normal S1 and S2 without gallops or murmurs.  Abdomen: Bowel sounds are positive, abdomen soft and non-tender  Msk:  Back normal, normal gait. Normal strength and tone for age. Extremities: No clubbing, cyanosis or edema.   Neuro: Alert and oriented X 3. Psych:  Good affect, responds appropriately  TELEMETRY: Normal sinus rhythm 85 bpm  ASSESSMENT AND PLAN: Congestive heart failure due to HFrEF and chronic renal insufficiency/influenza A infection and left pleural effusion due to pneumonitis.  Patient still has left chest tube but is feeling much better denies any chest pain or shortness of breath at this time.  Continue p.o. diuretics.  Will continue to follow the patient closely.  Principal Problem:   Influenza A Active Problems:   Iron deficiency anemia   Diabetes mellitus type 2 in obese (HCC)   CAD (coronary artery disease)   Essential (primary) hypertension   Hyperlipidemia, mixed   Obesity, Class III, BMI 40-49.9 (morbid obesity) ([place])   Chronic kidney disease due to type 2 diabetes mellitus (HCC)   Edema of lower extremity   Acute on chronic systolic CHF (congestive heart failure) (HCC)   Acute renal failure superimposed on stage 2 chronic kidney disease (HCC)   Recurrent left pleural effusion   Acute hypoxemic respiratory failure ([place])  Chronic membranous glomerulonephritis    Neoma Laming, MD, [facility] 04/19/2022 1:08 PM

## 2022-04-19 NOTE — Progress Notes (Signed)
[facility]  ROUNDING NOTE   Subjective:  Patient well-known to [place] as we follow him for idiopathic membranous glomerulonephritis with nephrotic syndrome, chronic kidney disease stage II with diabetes mellitus type 2, proteinuria, and lower extremity edema.  Patient came in with significant shortness of breath.  Found to be positive for the flu.  Has a left-sided chest tube in place.  Baseline creatinine 1.15 with EGFR of 67.  Most recent urine protein to creatinine ratio was down to 4.9.  He was to be evaluated by hematology for treatment with rituximab.  Patient resting comfortably Remains on 2L [place], denies shortness of breath Appetite appropriate Mild lower extremity edema  Objective:  Vital signs in last 24 hours:  Temp:  [97.5 F (36.4 C)-98.7 F (37.1 C)] 98.7 F (37.1 C) (02/04 0800) Pulse Rate:  [80-87] 83 (02/04 0800) Resp:  [15-19] 19 (02/04 0342) BP: (120-136)/(50-62) 120/59 (02/04 0800) SpO2:  [98 %-100 %] 100 % (02/04 1123) Weight:  [124.6 kg] 124.6 kg (02/04 0400)  Weight change: -0.6 kg Filed Weights   04/17/22 0356 04/18/22 0404 04/19/22 0400  Weight: 127.3 kg 125.2 kg 124.6 kg    Intake/Output: I/O last 3 completed shifts: In: 135.6 [P.O.:120; I.V.:15.6] Out: [place] [Urine:4800; Chest Tube:250]   Intake/Output this shift:  No intake/output data recorded.  Physical Exam: General: NAD  Head: Normocephalic, atraumatic. Moist oral mucosal membranes  Lungs:  Basilar wheeze, normal effort  Heart: S1S2 no rubs  Abdomen:  Soft, nontender, bowel sounds present  Extremities: 1+ brawny peripheral edema.  Neurologic: Awake, alert, following commands  Skin: No acute rash    Basic Metabolic Panel: Recent Labs  Lab 04/14/22 0506 04/15/22 0235 04/16/22 0445 04/17/22 0443 04/18/22 0455 04/19/22 0532  NA 132* 135 137 137 138 141  K 3.8 3.8 3.5 4.0 3.6 3.6  CL 100 99 101 102 103 102  CO2 '26 29 26 29 27 31  '$ GLUCOSE 141* 148* 114* 176* 134* 233*   BUN 82* 83* 83* 77* 74* 64*  CREATININE 2.16* 2.26* 2.08* 2.03* 1.84* 1.68*  CALCIUM 7.4* 7.6* 7.7* 7.8* 7.7* 8.0*  MG 2.4 2.4 2.5* 2.3 2.2  --   PHOS  --   --  4.6 3.6 2.9  --      Liver Function Tests: Recent Labs  Lab 04/15/22 0235  AST 20  ALT 11  ALKPHOS 60  BILITOT 1.2  PROT 5.6*  ALBUMIN 2.9*    No results for input(s): "LIPASE", "AMYLASE" in the last 168 hours. No results for input(s): "AMMONIA" in the last 168 hours.  CBC: Recent Labs  Lab 04/15/22 0235 04/16/22 0445 04/17/22 0443 04/18/22 0455 04/19/22 0532  WBC 5.1 4.1 6.0 5.0 5.2  HGB 7.7* 7.2* 7.2* 7.2* 7.5*  HCT 24.3* 22.0* 22.4* 22.5* 23.9*  MCV 88.4 88.4 89.6 88.9 89.2  PLT 204 196 182 192 212     Cardiac Enzymes: No results for input(s): "CKTOTAL", "CKMB", "CKMBINDEX", "TROPONINI" in the last 168 hours.  BNP: Invalid input(s): "POCBNP"  CBG: Recent Labs  Lab 04/18/22 0755 04/18/22 1143 04/18/22 1642 04/18/22 2041 04/19/22 0741  GLUCAP 114* 169* 200* 302* 209*     Microbiology: Results for orders placed or performed during the hospital encounter of 04/11/22  Resp panel by RT-PCR (RSV, Flu A&B, Covid) Anterior Nasal Swab     Status: Abnormal   Collection Time: 04/11/22  8:34 PM   Specimen: Anterior Nasal Swab  Result Value Ref Range Status   SARS Coronavirus 2  by RT PCR NEGATIVE NEGATIVE Final    Comment: (NOTE) SARS-CoV-2 target nucleic acids are NOT DETECTED.  The SARS-CoV-2 RNA is generally detectable in upper respiratory specimens during the acute phase of infection. The lowest concentration of SARS-CoV-2 viral copies this assay can detect is 138 copies/mL. A negative result does not preclude SARS-Cov-2 infection and should not be used as the sole basis for treatment or other patient management decisions. A negative result may occur with  improper specimen collection/handling, submission of specimen other than nasopharyngeal swab, presence of viral mutation(s) within  the areas targeted by this assay, and inadequate number of viral copies(<138 copies/mL). A negative result must be combined with clinical observations, patient history, and epidemiological information. The expected result is Negative.  Fact Sheet for Patients:  EntrepreneurPulse.com.au  Fact Sheet for Healthcare Providers:  IncredibleEmployment.be  This test is no t yet approved or cleared by the [place] FDA and  has been authorized for detection and/or diagnosis of SARS-CoV-2 by FDA under an Emergency Use Authorization (EUA). This EUA will remain  in effect (meaning this test can be used) for the duration of the COVID-19 declaration under Section 564(b)(1) of the Act, 21 U.S.C.section 360bbb-3(b)(1), unless the authorization is terminated  or revoked sooner.       Influenza A by PCR POSITIVE (A) NEGATIVE Final   Influenza B by PCR NEGATIVE NEGATIVE Final    Comment: (NOTE) The Xpert Xpress SARS-CoV-2/FLU/RSV plus assay is intended as an aid in the diagnosis of influenza from Nasopharyngeal swab specimens and should not be used as a sole basis for treatment. Nasal washings and aspirates are unacceptable for Xpert Xpress SARS-CoV-2/FLU/RSV testing.  Fact Sheet for Patients: EntrepreneurPulse.com.au  Fact Sheet for Healthcare Providers: IncredibleEmployment.be  This test is not yet approved or cleared by the [place] FDA and has been authorized for detection and/or diagnosis of SARS-CoV-2 by FDA under an Emergency Use Authorization (EUA). This EUA will remain in effect (meaning this test can be used) for the duration of the COVID-19 declaration under Section 564(b)(1) of the Act, 21 U.S.C. section 360bbb-3(b)(1), unless the authorization is terminated or revoked.     Resp Syncytial Virus by PCR NEGATIVE NEGATIVE Final    Comment: (NOTE) Fact Sheet for  Patients: EntrepreneurPulse.com.au  Fact Sheet for Healthcare Providers: IncredibleEmployment.be  This test is not yet approved or cleared by the [place] FDA and has been authorized for detection and/or diagnosis of SARS-CoV-2 by FDA under an Emergency Use Authorization (EUA). This EUA will remain in effect (meaning this test can be used) for the duration of the COVID-19 declaration under Section 564(b)(1) of the Act, 21 U.S.C. section 360bbb-3(b)(1), unless the authorization is terminated or revoked.  Performed at [facility], [address] [place], [place] [address]   Culture, blood (routine x 2)     Status: None   Collection Time: 04/11/22  9:59 PM   Specimen: BLOOD LEFT ARM  Result Value Ref Range Status   Specimen Description BLOOD LEFT ARM  Final   Special Requests   Final    BOTTLES DRAWN AEROBIC AND ANAEROBIC Blood Culture adequate volume   Culture   Final    NO GROWTH 5 DAYS Performed at [facility], [address] [place], [place] [address]    Report Status 04/16/2022 FINAL  Final  Culture, blood (routine x 2)     Status: None   Collection Time: 04/11/22 10:01 PM   Specimen: BLOOD LEFT HAND  Result Value  Ref Range Status   Specimen Description BLOOD LEFT HAND  Final   Special Requests   Final    BOTTLES DRAWN AEROBIC AND ANAEROBIC Blood Culture results may not be optimal due to an inadequate volume of blood received in culture bottles   Culture   Final    NO GROWTH 5 DAYS Performed at [facility], [address] [place], [place] [address]    Report Status 04/16/2022 FINAL  Final    Coagulation Studies: No results for input(s): "LABPROT", "INR" in the last 72 hours.  Urinalysis: No results for input(s): "COLORURINE", "LABSPEC", "PHURINE", "GLUCOSEU", "HGBUR", "BILIRUBINUR", "KETONESUR", "PROTEINUR", "UROBILINOGEN", "NITRITE", "LEUKOCYTESUR" in the last 72 hours.  Invalid input(s):  "APPERANCEUR"    Imaging: DG Chest Port 1 View  Result Date: 04/18/2022 CLINICAL DATA:  Follow-up left pleural effusion. EXAM: PORTABLE CHEST 1 VIEW COMPARISON:  04/15/2022 FINDINGS: Left-sided chest tube is in place and appears unchanged in position from previous exam. No pneumothorax identified. Unchanged opacification of the left lower lung which likely represents a combination of pleural effusion, airspace disease and atelectasis. Right lung appears clear IMPRESSION: 1. Stable position of left chest tube. No pneumothorax. 2. No change in aeration to the left lower lung. Electronically Signed   By: Kerby Moors M.D.   On: 04/18/2022 13:28     Medications:    methocarbamol (ROBAXIN) IV      sodium chloride   Intravenous Once   vitamin C  500 mg Oral Daily   atorvastatin  80 mg Oral Daily   bisacodyl  10 mg Oral QHS   Chlorhexidine Gluconate Cloth  6 each Topical Daily   enoxaparin (LOVENOX) injection  0.5 mg/kg Subcutaneous Q24H   guaiFENesin  600 mg Oral BID   insulin aspart  0-15 Units Subcutaneous TID WC   insulin aspart  0-5 Units Subcutaneous QHS   insulin glargine-yfgn  25 Units Subcutaneous QHS   ipratropium-albuterol  3 mL Nebulization Q6H   iron polysaccharides  150 mg Oral Daily   polyethylene glycol  17 g Oral BID   sodium chloride flush  3 mL Intravenous Q12H   spironolactone  50 mg Oral Daily   tacrolimus  1 mg Oral BID   tamsulosin  0.4 mg Oral QPC supper   torsemide  100 mg Oral Daily   Vitamin D (Ergocalciferol)  50,000 Units Oral Q7 days   acetaminophen **OR** acetaminophen, bisacodyl, guaiFENesin-dextromethorphan, HYDROmorphone (DILAUDID) injection, methocarbamol (ROBAXIN) IV, ondansetron **OR** ondansetron (ZOFRAN) IV, oxyCODONE  Assessment/ Plan:  72 y.o. male with past medical history of diabetes mellitus type 2, morbid obesity, hypertension, coronary artery disease status post stent placement, cardiomyopathy, low testosterone, hyperlipidemia, history of  first left toe osteomyelitis with amputation, hydronephrosis, osteoarthritis of the right hip, history of idiopathic membranous glomerulonephritis who presents with increasing shortness of breath.  1.  Idiopathic membranous glomerulonephritis/ Acute kidney injury on chronic kidney disease stage II/proteinuria most recent urine protein to creatinine ratio 4.9.  Patient was in the process of being evaluated for rituximab treatment of his idiopathic membranous glomerulonephritis. -Hold off on administering rituximab at this time.  Will begin Tacrolimus '1mg'$  twice daily. Will check Tacrolimus level on Wednesday. Once health status improves, will consider rituximab. -Renal function continues to improve with oral diuresis. .   2. Lower extremity edema. Edema greatly improved since admission due to IV diuresis. Continue oral diuresis.    3.  Anemia of chronic kidney disease.  Hemoglobin 7.5 today.  4.  Acute on chronic combined  systolic and diastolic CHF, acute hypoxemic respiratory failure, volume overload, coronary artery disease. Volume management with oral diuresis. Recurrent left-sided pleural effusion secondary to nephrotic syndrome, third spacing of fluid.  Left chest tube remains in place.  5.  Influenza A Completed Tamiflu. Continue supportive care.    LOS: [place] 2/4/202411:53 AM

## 2022-04-19 NOTE — Progress Notes (Signed)
PULMONOLOGY         Date: 04/19/2022,   MRN# [id number] [name] 09/01/1950     AdmissionWeight: (!) 138.7 kg                 CurrentWeight: 124.6 kg  Referring provider: Dr Roosevelt Locks   CHIEF COMPLAINT:   Parapneumonic effusion s/p chest tube placement    HISTORY OF PRESENT ILLNESS   This is a pleasant 72 year old male with severe morbid obesity chronic 3+ weeping lower extremity edema due to CKD with Membranous glomerulonephritis disease and recent evaluation with nephrology for rituximab infusion, history of CHF with reduced EF at 40%, osteoarthritis, diabetes, history of melanoma, neuropathy, spinal stenosis with chief complaint of shortness of breath and chest discomfort.  Patient is well-known to our pulmonology service with [facility] clinic we recently saw him for acute on chronic hypoxemic respiratory failure noted to have left-sided large effusion and right-sided mild to moderate effusion.  Status postthoracentesis x 2 with over 1 L drained.  This is a third time that he is shown to have a large pleural effusion in stent 3 months.  He did receive pleural drain upon arrival to the ER with 1500 cc serosanguineous return overnight and active drainage this a.m. and up to 150 cc before lunchtime.  On interview and physical exam patient reports slight improvement mild chest discomfort at pigtail insertion site.  Chest tube management displays no airleak upon forced expiratory maneuver.   04/14/22- patient examined at bedside, s/p repeat CBC with severe anemia requiring PRBC transfusion. S/p [place] abd no ascites. Chest tube in place on left , persistent and recurrent effusion present on CXR 1/28.  Will repeat CXR again today.  On Lasix gtt with 600cc urine overnight. Development of transient hypotension with req for midodrine.   04/15/22-  patient reports nausea. He is on oxycodone.  He appears less swollen today. Overall he is net 1.3L negative and has active fluid per Chest  tube. He needs to start using incentive spirometer today.    04/16/22- patient had 150cc this am per chest tube.  Patient reports improvement clinically.  Continue chest tube for now.   04/17/22- patient on 2L/min nasal [place].  He still has straw colored phlegm returning from chest tube.  He coughed up phlegm with blood/rusty colored. He had obstructiive urophathy and had foley placed feels a lot better now. He is constipated but did have BM today shares he is much improved.  04/18/22- patient is resting in bed.  Still has straw colored return overnight >250cc which will likely continue.  He had cardiology DR [name] come to evaluate him today.   Renal function seems to be improving and I will repeat cxr today for interval effusion changes.  04/19/22- patient aspirated today and was coughing a lot during my evaluation.  Have ordered extra neb treatment.  He is on 2L/min [place].  Overnight >100cc return to chest tube, still unable to come out.  Have ordered physical therapy.   PAST MEDICAL HISTORY   Past Medical History:  Diagnosis Date   Arthritis    CHF (congestive heart failure), NYHA class II, acute on chronic, systolic (HCC)    EF 09% 09/3530   Coronary artery disease    [facility]   Diabetes mellitus without complication ([place])    Dysplastic nevus 08/26/2020   L flank, moderate atypia   Hyperlipidemia    Hypertension    Melanoma ([place]) 08/20/2015   Right neck.  Superficial spreading, arising in nevus. Tumor thickness 1.39m, Anatomic level IV   [place] in situ [place] 05/22/[place]   R lateral neck ant to scar, exc 05/22/20   Numbness in right leg    due to back   Skin cancer    removed l arm   Spinal stenosis    Stented coronary artery 2000   X 1 STENT   Swelling of both lower extremities      SURGICAL HISTORY   Past Surgical History:  Procedure Laterality Date   CATARACT EXTRACTION W/PHACO Right 05/03/2019   Procedure: CATARACT EXTRACTION PHACO AND INTRAOCULAR LENS  PLACEMENT (IOC) RIGHT DIABETIC 6.44 00:58.4 11.0%;  Surgeon: BLeandrew Koyanagi MD;  [facility]  Service: Ophthalmology;  Laterality: Right;  DIABETIC   CATARACT EXTRACTION W/PHACO Left 06/28/2019   Procedure: CATARACT EXTRACTION PHACO AND INTRAOCULAR LENS PLACEMENT (IOC) LEFT DIABETIC 7.99  01:13.4  10.9% ;  Surgeon: BLeandrew Koyanagi MD;  [facility]  Service: Ophthalmology;  Laterality: Left;  Diabetic - insulin and oral meds   CHOLECYSTECTOMY     JOINT REPLACEMENT     RT KNEE   LUMBAR LAMINECTOMY/DECOMPRESSION MICRODISCECTOMY  04/13/2012   Procedure: LUMBAR LAMINECTOMY/DECOMPRESSION MICRODISCECTOMY 1 LEVEL;  Surgeon: JJohnn Hai MD;  Location: [facility];  Service: Orthopedics;  Laterality: Bilateral;  L4-L5   UVULECTOMY  1990's     FAMILY HISTORY   History reviewed. No pertinent family history.   SOCIAL HISTORY   Social History   Tobacco Use   Smoking status: Never   Smokeless tobacco: Never  Vaping Use   Vaping Use: Never used  Substance Use Topics   Alcohol use: Yes    Comment: rare   Drug use: No     MEDICATIONS    Home Medication:     Current Medication:  Current Facility-Administered Medications:    0.9 %  sodium chloride infusion (Manually program via Guardrails IV Fluids), , Intravenous, Once, ZSharen Hones MD   acetaminophen (TYLENOL) tablet 650 mg, 650 mg, Oral, Q6H PRN **OR** acetaminophen (TYLENOL) suppository 650 mg, 650 mg, Rectal, Q6H PRN, MSid Falcon MD   ascorbic acid (VITAMIN C) tablet 500 mg, 500 mg, Oral, Daily, KVal Riles MD, 500 mg at 04/19/22 08099  atorvastatin (LIPITOR) tablet 80 mg, 80 mg, Oral, Daily, MGilles ChiquitoB, MD, 80 mg at 04/19/22 08338  bisacodyl (DULCOLAX) EC tablet 10 mg, 10 mg, Oral, QHS, KVal Riles MD, 10 mg at 04/18/22 2126   bisacodyl (DULCOLAX) suppository 10 mg, 10 mg, Rectal, Daily PRN, KVal Riles MD   Chlorhexidine Gluconate Cloth 2 % PADS 6 each, 6 each,  Topical, Daily, KVal Riles MD, 6 each at 04/19/22 0832   enoxaparin (LOVENOX) injection 70 mg, 0.5 mg/kg, Subcutaneous, Q24H, MGilles ChiquitoB, MD, 70 mg at 04/19/22 0831   guaiFENesin (MUCINEX) 12 hr tablet 600 mg, 600 mg, Oral, BID, KVal Riles MD, 600 mg at 04/19/22 02505  guaiFENesin-dextromethorphan (ROBITUSSIN DM) 100-10 MG/5ML syrup 5 mL, 5 mL, Oral, Q4H PRN, MGilles ChiquitoB, MD, 5 mL at 04/13/22 2154   HYDROmorphone (DILAUDID) injection 0.5-1 mg, 0.5-1 mg, Intravenous, Q2H PRN, MGilles ChiquitoB, MD, 1 mg at 04/14/22 2136   insulin aspart (novoLOG) injection 0-15 Units, 0-15 Units, Subcutaneous, TID WC, MGilles ChiquitoB, MD, 5 Units at 04/19/22 03976  insulin aspart (novoLOG) injection 0-5 Units, 0-5 Units, Subcutaneous, QHS, MSid Falcon MD, 4 Units at 04/18/22 2127   insulin glargine-yfgn (SEMGLEE) injection 25  Units, 25 Units, Subcutaneous, QHS, Sharen Hones, MD, 25 Units at 04/18/22 2129   iron polysaccharides (NIFEREX) capsule 150 mg, 150 mg, Oral, Daily, Val Riles, MD, 150 mg at 04/19/22 2671   methocarbamol (ROBAXIN) 500 mg in dextrose 5 % 50 mL IVPB, 500 mg, Intravenous, Q6H PRN, Sid Falcon, MD   ondansetron (ZOFRAN) tablet 4 mg, 4 mg, Oral, Q6H PRN **OR** ondansetron (ZOFRAN) injection 4 mg, 4 mg, Intravenous, Q6H PRN, Gilles Chiquito B, MD, 4 mg at 04/17/22 0920   oxyCODONE (Oxy IR/ROXICODONE) immediate release tablet 5 mg, 5 mg, Oral, Q4H PRN, Gilles Chiquito B, MD, 5 mg at 04/18/22 2126   polyethylene glycol (MIRALAX / GLYCOLAX) packet 17 g, 17 g, Oral, BID, Val Riles, MD, 17 g at 04/19/22 2458   sodium chloride flush (NS) 0.9 % injection 3 mL, 3 mL, Intravenous, Q12H, Gilles Chiquito B, MD, 3 mL at 04/19/22 0998   spironolactone (ALDACTONE) tablet 50 mg, 50 mg, Oral, Daily, Breeze, Benancio Deeds, NP, 50 mg at 04/19/22 3382   tacrolimus (PROGRAF) capsule 1 mg, 1 mg, Oral, BID, Murlean Iba, MD   tamsulosin (FLOMAX) capsule 0.4 mg, 0.4 mg, Oral, QPC supper, Val Riles, MD, 0.4 mg at 04/18/22 1732   torsemide (DEMADEX) tablet 100 mg, 100 mg, Oral, Daily, Breeze, Shantelle, NP, 100 mg at 04/19/22 5053   Vitamin D (Ergocalciferol) (DRISDOL) 1.25 MG (50000 UNIT) capsule 50,000 Units, 50,000 Units, Oral, Q7 days, Val Riles, MD, 50,000 Units at 04/17/22 1228    ALLERGIES   Carvedilol, Metoprolol, Toprol xl [metoprolol tartrate], Buprenorphine, Buprenorphine hcl, Morphine, Morphine and related, Triamcinolone, and Triamcinolone acetonide     REVIEW OF SYSTEMS    Review of Systems:  Gen:  Denies  fever, sweats, chills weigh loss  HEENT: Denies blurred vision, double vision, ear pain, eye pain, hearing loss, nose bleeds, sore throat Cardiac:  No dizziness, chest pain or heaviness, chest tightness,edema Resp:   reports dyspnea chronically  Gi: Denies swallowing difficulty, stomach pain, nausea or vomiting, diarrhea, constipation, bowel incontinence Gu:  Denies bladder incontinence, burning urine Ext:   Denies Joint pain, stiffness or swelling Skin: Denies  skin rash, easy bruising or bleeding or hives Endoc:  Denies polyuria, polydipsia , polyphagia or weight change Psych:   Denies depression, insomnia or hallucinations   Other:  All other systems negative   VS: BP (!) 120/59   Pulse 83   Temp 98.7 F (37.1 C)   Resp 19   Ht 6' (1.829 m)   Wt 124.6 kg   SpO2 98%   BMI 37.26 kg/m      PHYSICAL EXAM    GENERAL:NAD, no fevers, chills, no weakness no fatigue HEAD: Normocephalic, atraumatic.  EYES: Pupils equal, round, reactive to light. Extraocular muscles intact. No scleral icterus.  MOUTH: Moist mucosal membrane. Dentition intact. No abscess noted.  EAR, NOSE, THROAT: Clear without exudates. No external lesions.  NECK: Supple. No thyromegaly. No nodules. No JVD.  PULMONARY: decreased breath sounds with mild rhonchi worse at bases bilaterally.  CARDIOVASCULAR: S1 and S2. Regular rate and rhythm. No murmurs, rubs, or gallops. No  edema. Pedal pulses 2+ bilaterally.  GASTROINTESTINAL: Soft, nontender, nondistended. No masses. Positive bowel sounds. No hepatosplenomegaly.  MUSCULOSKELETAL: No swelling, clubbing, or edema. Range of motion full in all extremities.  NEUROLOGIC: Cranial nerves II through XII are intact. No gross focal neurological deficits. Sensation intact. Reflexes intact.  SKIN: No ulceration, lesions, rashes, or cyanosis. Skin warm and dry. Turgor intact.  PSYCHIATRIC:  Mood, affect within normal limits. The patient is awake, alert and oriented x 3. Insight, judgment intact.       IMAGING     ASSESSMENT/PLAN   Acute on chronic hypoxemic respiratory failure Present on admission due to influenza A infection with recurrent left pleural effusion and associated compressive atelectasis -Patient's evaluated by nephrology and is status post Lasix drip as well as pigtail chest tube on the left with 400cc serosanguineous fluid overnight and active drain during examination today -Is no air leak upon forced aspiratory maneuver, chest tube is not clogged and fluid is freely flowing. -Fluid was previously tested and was noted to be lymphocyte predominant fluid and became exudative upon initiation of diuretics which is known to happen.  Does not appear to be empyema and I do not think he is a very good candidate for intrapleural thrombolytics.  Patient may be heading towards dialysis but if not he may also benefit from thoracic surgery evaluation and possible pleurodesis.     CKD - per nephro    Rutixan plan at some point.    Thank you for allowing me to participate in the care of this patient.   Patient/Family are satisfied with care plan and all questions have been answered.    Provider disclosure: Patient with at least one acute or chronic illness or injury that poses a threat to life or bodily function and is being managed actively during this encounter.  All of the below services have been performed  independently by signing provider:  review of prior documentation from internal and or external health records.  Review of previous and current lab results.  Interview and comprehensive assessment during patient visit today. Review of current and previous chest radiographs/CT scans. Discussion of management and test interpretation with health care team and patient/family.   This document was prepared using Dragon voice recognition software and may include unintentional dictation errors.     Ottie Glazier, M.D.  Division of Pulmonary & Critical Care Medicine

## 2022-04-19 NOTE — Progress Notes (Signed)
[facility] Progress Note  Patient: [name]    ASN:[id number]  DOA: 04/11/2022     Date of Service: the patient was seen and examined on 04/19/2022  Chief Complaint  Patient presents with   Shortness of Breath   Brief hospital course: BACILIO ABASCAL is a 72 y.o. male with PMH of arthritis, CHF (chronic), CAD, DM2, HLD, HTN, h/o treated melanoma, spinal stenosis who presents for worsening cough, fatigue and sob. Had low-grade fever, he was found to have positive for influenza A. He had recurrent left-sided pleural effusion, had a thoracentesis x 2 during the last months.  Upon arrival emergency room, chest x-ray showed large left-sided pleural effusion again, chest tube was placed by ED physician. Patient also developed significant hypoxemia with oxygen saturation as low as 76% on room air this morning.  Treated with oxygen. Patient has severe volume overload with evidence of bichamber heart failure.  Echocardiogram showed EF 45-50% with grade 2 diastolic dysfunction. Pulm valve not visualized.  Furosemide drip started after seen by nephrology.   1/29.  Patient had minimal urine after Lasix, but chest tube had drained additional 1500 mL in 24 hours.    Assessment and Plan: Acute on chronic combined systolic and diastolic congestive heart failure Acute hypoxemic respiratory failure secondary to volume overload. Elevated troponin with NSTEMI. Coronary artery disease. Continue supplemental O2 nation and gradually wean off Biventricular heart failure, significant volume overload, edema of bilateral lower extremities, elevated BNP Abdominal ultrasound did not show any ascites. Nephro and cardiology consulted S/p Lasix IV infusion, discontinued on 2/2, started torsemide 100 mg p.o. daily and Aldactone 50 mg p.o. daily Good urine output after Foley catheter insertion   Recurrent left side pleural effusion.  Hemothorax ruled out. Chest tube was placed in the emergency room, removed  2 L of serous fluids.  No evidence of bleeding.  Based on prior lab test, cytology was negative.  Prior test also does not indicate infection.  Reviewed chest x-ray post chest tube, no evidence of pneumonia. Procalcitonin level not significant elevated at 0.2 with chronic renal function.  No evidence of infection.  Patient is followed by pulmonology, patient appears to have rapid accumulation of pleural fluid.  Had a discussion with CT surgery, pulmonology and nephrology.  Patient still has a large amount of the drain from the chest tube, patient cannot be discharged with chest tube in place.  The source of increased pleural effusion is due to heart failure, third spacing from renal disease.  Patient will be given albumin, diuretics.  If this does not improve, we may have to use Pleurx catheter. 2/3 follow repeat chest x-ray  Influenza A. S/p Tamiflu for 5 days, last dose on 04/16/22 2/1 started Mucinex 600 mg p.o. twice daily due to chest congestion   Acute kidney injury on chronic kidney disease stage II. Idiopathic membranous glomerulonephritis. Hyponatremia. Patient has a worsening renal function on chronic renal disease.  He is grossly volume overloaded, he is started on Lasix drip.  Renal function is gradually getting worse.  There is a possibility that patient may need a dialysis. Nephrology is working with oncology to start rituximab.  2/4 started tacrolimus 1 mg p.o. twice daily until patient is able to take rituximab  Iron deficient anemia. Acute blood loss anemia ruled out. Patient received IV iron, hemoglobin dropped down to 6.9, chest tube fluids does not seem to be bloody.  s/p 1 unit PRBC.  No evidence of GI bleed. Hb 7.2 --7.5  Continue to monitor H&H and transfuse if hemoglobin less than 7   Uncontrolled type 2 diabetes with hyperglycemia Continue long-acting insulin, sliding scale insulin.   Essential hypertension Continue to monitor.   Morbid obesity BMI 41.47, Likely  obstructive sleep apnea. Diet exercise. Will decide if patient need BiPAP before discharge.   Urinary retention, Foley catheter was inserted on 04/16/2022 Started Flomax Continue Foley catheter for 1 week and then DC when patient is more ambulatory  Vitamin D deficiency: started vitamin D 50,000 units p.o. weekly, follow with PCP to repeat vitamin D level after 3 to 6 months.   Body mass index is 41.47 kg/m.  Interventions:       Diet: Carb modified diet DVT Prophylaxis: Subcutaneous Lovenox   Advance goals of care discussion: Full code  Family Communication: family was not present at bedside, at the time of interview.  The pt provided permission to discuss medical plan with the family. Opportunity was given to ask question and all questions were answered satisfactorily.   Disposition:  Pt is from Home, admitted with anasarca and left pleural effusion, still has left-sided chest tube, which precludes a safe discharge. Discharge to home, when clinically stable, needs pulmonary clearance.  Subjective: No significant events overnight, in the morning time patient was eating breakfast and choked with food, patient was advised to stay upright while eating.  Seems to be improving now.  Still has productive cough.  Lower extremity edema is improving, chest tube is still intact due to high output.  Patient denies any chest pain, no palpitations.  No any other active issues.  Physical Exam: General: NAD, lying comfortably Appear in no distress, affect appropriate Eyes: PERRLA ENT: Oral Mucosa Clear, moist  Neck: no JVD,  Cardiovascular: S1 and S2 Present, no Murmur,  Respiratory: good respiratory effort, Bilateral Air entry equal and Decreased, bilateral crackles, decreased breath sounds in the left lower chest, no wheezing, left chest tube intact Abdomen: Bowel Sound present, Soft and no tenderness,  Skin: no rashes Extremities: 2-3+ Pedal edema, no calf tenderness.  Edema is gradually  improving Neurologic: without any new focal findings Gait not checked due to patient safety concerns  Vitals:   04/19/22 0342 04/19/22 0400 04/19/22 0800 04/19/22 1123  BP: (!) 124/50  (!) 120/59   Pulse: 82  83   Resp: 19     Temp: 98.5 F (36.9 C)  98.7 F (37.1 C)   TempSrc: Oral     SpO2: 99%  98% 100%  Weight:  124.6 kg    Height:        Intake/Output Summary (Last 24 hours) at 04/19/2022 1130 Last data filed at 04/19/2022 0600 Gross per 24 hour  Intake 120 ml  Output 3850 ml  Net -3730 ml   Filed Weights   04/17/22 0356 04/18/22 0404 04/19/22 0400  Weight: 127.3 kg 125.2 kg 124.6 kg    Data Reviewed: I have personally reviewed and interpreted daily labs, tele strips, imagings as discussed above. I reviewed all nursing notes, pharmacy notes, vitals, pertinent old records I have discussed plan of care as described above with RN and patient/family.  CBC: Recent Labs  Lab 04/15/22 0235 04/16/22 0445 04/17/22 0443 04/18/22 0455 04/19/22 0532  WBC 5.1 4.1 6.0 5.0 5.2  HGB 7.7* 7.2* 7.2* 7.2* 7.5*  HCT 24.3* 22.0* 22.4* 22.5* 23.9*  MCV 88.4 88.4 89.6 88.9 89.2  PLT 204 196 182 192 793   Basic Metabolic Panel: Recent Labs  Lab 04/14/22 0506 04/15/22  3154 04/16/22 0445 04/17/22 0443 04/18/22 0455 04/19/22 0532  NA 132* 135 137 137 138 141  K 3.8 3.8 3.5 4.0 3.6 3.6  CL 100 99 101 102 103 102  CO2 '26 29 26 29 27 31  '$ GLUCOSE 141* 148* 114* 176* 134* 233*  BUN 82* 83* 83* 77* 74* 64*  CREATININE 2.16* 2.26* 2.08* 2.03* 1.84* 1.68*  CALCIUM 7.4* 7.6* 7.7* 7.8* 7.7* 8.0*  MG 2.4 2.4 2.5* 2.3 2.2  --   PHOS  --   --  4.6 3.6 2.9  --     Studies: No results found.  Scheduled Meds:  sodium chloride   Intravenous Once   vitamin C  500 mg Oral Daily   atorvastatin  80 mg Oral Daily   bisacodyl  10 mg Oral QHS   Chlorhexidine Gluconate Cloth  6 each Topical Daily   enoxaparin (LOVENOX) injection  0.5 mg/kg Subcutaneous Q24H   guaiFENesin  600 mg Oral BID    insulin aspart  0-15 Units Subcutaneous TID WC   insulin aspart  0-5 Units Subcutaneous QHS   insulin glargine-yfgn  25 Units Subcutaneous QHS   ipratropium-albuterol  3 mL Nebulization Q6H   iron polysaccharides  150 mg Oral Daily   polyethylene glycol  17 g Oral BID   sodium chloride flush  3 mL Intravenous Q12H   spironolactone  50 mg Oral Daily   tacrolimus  1 mg Oral BID   tamsulosin  0.4 mg Oral QPC supper   torsemide  100 mg Oral Daily   Vitamin D (Ergocalciferol)  50,000 Units Oral Q7 days   Continuous Infusions:  methocarbamol (ROBAXIN) IV     PRN Meds: acetaminophen **OR** acetaminophen, bisacodyl, guaiFENesin-dextromethorphan, HYDROmorphone (DILAUDID) injection, methocarbamol (ROBAXIN) IV, ondansetron **OR** ondansetron (ZOFRAN) IV, oxyCODONE  Time spent: 35 minutes  Author: Val Riles. MD [facility] 04/19/2022 11:30 AM  To reach On-call, see care teams to locate the attending and reach out to them via [email or web address]. If 7PM-7AM, please contact night-coverage If you still have difficulty reaching the attending provider, please page the [facility] (Director on Call) for [facility] on amion for assistance.

## 2022-04-20 ENCOUNTER — Encounter: Payer: Self-pay | Admitting: Oncology

## 2022-04-20 ENCOUNTER — Other Ambulatory Visit (HOSPITAL_COMMUNITY): Payer: Self-pay

## 2022-04-20 DIAGNOSIS — J101 Influenza due to other identified influenza virus with other respiratory manifestations: Secondary | ICD-10-CM | POA: Diagnosis not present

## 2022-04-20 LAB — CBC
HCT: 24.4 % — ABNORMAL LOW (ref 39.0–52.0)
Hemoglobin: 7.8 g/dL — ABNORMAL LOW (ref 13.0–17.0)
MCH: 28.3 pg (ref 26.0–34.0)
MCHC: 32 g/dL (ref 30.0–36.0)
MCV: 88.4 fL (ref 80.0–100.0)
Platelets: 234 10*3/uL (ref 150–400)
RBC: 2.76 MIL/uL — ABNORMAL LOW (ref 4.22–5.81)
RDW: 15.7 % — ABNORMAL HIGH (ref 11.5–15.5)
WBC: 5.9 10*3/uL (ref 4.0–10.5)
nRBC: 0 % (ref 0.0–0.2)

## 2022-04-20 LAB — GLUCOSE, CAPILLARY
Glucose-Capillary: 184 mg/dL — ABNORMAL HIGH (ref 70–99)
Glucose-Capillary: 269 mg/dL — ABNORMAL HIGH (ref 70–99)
Glucose-Capillary: 278 mg/dL — ABNORMAL HIGH (ref 70–99)
Glucose-Capillary: 322 mg/dL — ABNORMAL HIGH (ref 70–99)

## 2022-04-20 LAB — BASIC METABOLIC PANEL
Anion gap: 9 (ref 5–15)
BUN: 53 mg/dL — ABNORMAL HIGH (ref 8–23)
CO2: 32 mmol/L (ref 22–32)
Calcium: 8 mg/dL — ABNORMAL LOW (ref 8.9–10.3)
Chloride: 99 mmol/L (ref 98–111)
Creatinine, Ser: 1.45 mg/dL — ABNORMAL HIGH (ref 0.61–1.24)
GFR, Estimated: 51 mL/min — ABNORMAL LOW (ref >60)
Glucose, Bld: 215 mg/dL — ABNORMAL HIGH (ref 70–99)
Potassium: 3.7 mmol/L (ref 3.5–5.1)
Sodium: 140 mmol/L (ref 135–145)

## 2022-04-20 MED ORDER — IPRATROPIUM-ALBUTEROL 0.5-2.5 (3) MG/3ML IN SOLN
3.0000 mL | Freq: Two times a day (BID) | RESPIRATORY_TRACT | Status: DC
  Administered 2022-04-20 – 2022-04-22 (×4): 3 mL via RESPIRATORY_TRACT
  Filled 2022-04-20 (×4): qty 3

## 2022-04-20 NOTE — Progress Notes (Signed)
[place] NOTE       Patient ID: [name] MRN: [id number] DOB/AGE: 1950/03/22 72 y.o.  Admit date: 04/11/2022 Referring Physician Dr. Roosevelt Locks Primary Physician Dr. Lovie Macadamia Primary Cardiologist Dr. [name], Leroy Libman, PA-C  Reason for Consultation AoCHF  HPI: [name] "[name]" Treichler is a 14yoM with a PMH of HFmrEF (45-50%, global hypo, mod LVH, g2dd 03/2022), CAD s/p PCI mid LAD (2001), HLD, DM2, IDA, melanoma s/p excision (2018), idiopathic membranous glomerulonephritis w/ nephrotic syndrome, recurrent bilateral pleural effusions who presented to [facility] ED 04/11/22 with confusion, hemoptysis and increased shortness of breath x 1 day. Positive for influenza A. Febrile and tachycardic on admission, CTA chest with large left effusion s/p pigtail chest tube in the ED on 1/28. Hospital course complicated by [facility], AKI following diuresis, and anemia requiring 1 unit pRBCs & IV iron. S/p lasix gtt & albumin. Remains on supplemental O2 , chest tube with significant output.   Interval History:  - feels good today, chest tube still with modest output - no chest pain or shortness of breath - remains on supplemental O2, on PO diuretics.   Weight today 124kg. Admission weight: 138.7kg.   Review of systems complete and found to be negative unless listed above     Past Medical History:  Diagnosis Date   Arthritis    CHF (congestive heart failure), NYHA class II, acute on chronic, systolic (HCC)    EF 40% 11/8117   Coronary artery disease    [facility]   Diabetes mellitus without complication ([place])    Dysplastic nevus 08/26/2020   L flank, moderate atypia   Hyperlipidemia    Hypertension    Melanoma ([place]) 08/20/2015   Right neck. Superficial spreading, arising in nevus. Tumor thickness 1.16m, Anatomic level IV   [place]   R lateral neck ant to scar, exc 05/22/20   Numbness in right leg    due to back   Skin cancer     removed l arm   Spinal stenosis    Stented coronary artery 2000   X 1 STENT   Swelling of both lower extremities     Past Surgical History:  Procedure Laterality Date   CATARACT EXTRACTION W/PHACO Right 05/03/2019   Procedure: CATARACT EXTRACTION PHACO AND INTRAOCULAR LENS PLACEMENT (IOC) RIGHT DIABETIC 6.44 00:58.4 11.0%;  Surgeon: BLeandrew Koyanagi MD;  Location: [facility]  Service: Ophthalmology;  Laterality: Right;  DIABETIC   CATARACT EXTRACTION W/PHACO Left 06/28/2019   Procedure: CATARACT EXTRACTION PHACO AND INTRAOCULAR LENS PLACEMENT (IOC) LEFT DIABETIC 7.99  01:13.4  10.9% ;  Surgeon: BLeandrew Koyanagi MD;  Location: [facility]  Service: Ophthalmology;  Laterality: Left;  Diabetic - insulin and oral meds   CHOLECYSTECTOMY     JOINT REPLACEMENT     RT KNEE   LUMBAR LAMINECTOMY/DECOMPRESSION MICRODISCECTOMY  04/13/2012   Procedure: LUMBAR LAMINECTOMY/DECOMPRESSION MICRODISCECTOMY 1 LEVEL;  Surgeon: JJohnn Hai MD;  Location: [facility];  Service: Orthopedics;  Laterality: Bilateral;  L4-L5   UVULECTOMY  1990's    Medications Prior to Admission  Medication Sig Dispense Refill Last Dose   aspirin EC 81 MG tablet Take 162 mg by mouth daily. Swallow whole.   04/10/2022   atorvastatin (LIPITOR) 80 MG tablet Take 80 mg by mouth daily.   04/11/2022   Continuous Blood Gluc Receiver (DEXCOM G6 RECEIVER) DEVI Use to monitor blood sugar. [id number]  04/11/2022   Continuous Blood Gluc Sensor (DEXCOM G6  SENSOR) MISC Use to monitor blood sugar.  Replace every 10 days. [place] [id number].   04/11/2022   Continuous Blood Gluc Transmit (DEXCOM G6 TRANSMITTER) MISC Use to monitor blood sugar.  Replace every 3 months. [place] [id number].   04/11/2022   insulin glargine (LANTUS) 100 UNIT/ML injection Inject 34 Units into the skin daily.   04/10/2022   metFORMIN (GLUCOPHAGE) 500 MG tablet Take 1,000 mg by mouth daily with supper.      torsemide (DEMADEX) 100 MG tablet  Take 100 mg by mouth daily.   04/10/2022   amLODipine (NORVASC) 5 MG tablet Take 5 mg by mouth daily. (Patient not taking: Reported on 04/12/2022)   Not Taking   FEROSUL 325 (65 Fe) MG tablet Take 1 tablet (325 mg total) by mouth 2 (two) times daily. (Patient not taking: Reported on 04/12/2022) 180 tablet 1 Not Taking   ferrous sulfate (FEROSUL) 325 (65 FE) MG tablet Take by mouth.   04/10/2022   glipiZIDE (GLUCOTROL XL) 5 MG 24 hr tablet       insulin aspart (NOVOLOG) 100 UNIT/ML injection Inject into the skin 3 (three) times daily before meals.      Insulin Pen Needle (COMFORT EZ PEN NEEDLES) 32G X 8 MM MISC See admin instructions.      isosorbide mononitrate (IMDUR) 30 MG 24 hr tablet Take by mouth.      JARDIANCE 10 MG TABS tablet       LANTUS SOLOSTAR 100 UNIT/ML Solostar Pen SMARTSIG:44 Unit(s) SUB-Q Every Night      NOVOLOG FLEXPEN 100 UNIT/ML FlexPen INJ UP TO 50 UNI D [place] IN MULTIPLE INJECTIONS B MEALS UTD      spironolactone (ALDACTONE) 25 MG tablet Take 50 mg by mouth daily.   04/10/2022   urea (CARMOL) 10 % cream Apply topically. (Patient not taking: Reported on 02/19/2022)      valsartan (DIOVAN) 320 MG tablet Take 320 mg by mouth daily.   04/10/2022   Social History   Socioeconomic History   Marital status: Widowed    Spouse name: Not on file   Number of children: Not on file   Years of education: Not on file   Highest education level: Not on file  Occupational History   Not on file  Tobacco Use   Smoking status: Never   Smokeless tobacco: Never  Vaping Use   Vaping Use: Never used  Substance and Sexual Activity   Alcohol use: Yes    Comment: rare   Drug use: No   Sexual activity: Not on file  Other Topics Concern   Not on file  Social History Narrative   Not on file   Social Determinants of Health   Financial Resource Strain: Not on file  Food Insecurity: No Food Insecurity (04/13/2022)   Hunger Vital Sign    [name]: Never  true    [name]: Never true  Transportation Needs: No Transportation Needs (04/13/2022)   PRAPARE - Hydrologist (Medical): No    Lack of Transportation (Non-Medical): No  Physical Activity: Not on file  Stress: Not on file  Social Connections: Not on file  Intimate Partner Violence: Not At Risk (04/13/2022)   Humiliation, Afraid, Rape, and Kick questionnaire    Fear of Current or Ex-Partner: No    Emotionally Abused: No    Physically Abused: No    Sexually Abused: No  History reviewed. No pertinent family history.    Intake/Output Summary (Last 24 hours) at 04/20/2022 0833 Last data filed at 04/19/2022 1800 Gross per 24 hour  Intake 0 ml  Output 2415 ml  Net -2415 ml     Vitals:   04/20/22 0243 04/20/22 0500 04/20/22 0732 04/20/22 0802  BP: (!) 124/56   (!) 121/52  Pulse: 87   94  Resp:    16  Temp: 98.2 F (36.8 C)   98.6 F (37 C)  TempSrc: Oral     SpO2: 99%  99% 100%  Weight:  124.2 kg    Height:        PHYSICAL EXAM General: ill appearing caucasian male, in no acute distress. Sitting upright in bed. HEENT:  Normocephalic and atraumatic. Neck:  No JVD.  Lungs: Normal respiratory effort on O2 by [place]. Decreased breath sounds with trace rhonchi throughout, decreased in L base Chest: L pigtail chest tube, straw colored output Heart: HRRR . Normal S1 and S2 without gallops or murmurs.  Abdomen: Non-distended appearing.  Msk: Normal strength and tone for age. Extremities:  No clubbing, cyanosis. 1+ bilateral LE edema although improving. ACE wraps up to knees, chronic hyperpigmentation/scaling. Generalized pedal edema Neuro: Alert and oriented X 3. Psych:  Answers questions appropriately.   Labs: Basic Metabolic Panel: Recent Labs    04/18/22 0455 04/19/22 0532 04/20/22 0520  NA 138 141 140  K 3.6 3.6 3.7  CL 103 102 99  CO2 27 31 32  GLUCOSE 134* 233* 215*  BUN 74* 64* 53*  CREATININE 1.84* 1.68* 1.45*   CALCIUM 7.7* 8.0* 8.0*  MG 2.2  --   --   PHOS 2.9  --   --     Liver Function Tests: No results for input(s): "AST", "ALT", "ALKPHOS", "BILITOT", "PROT", "ALBUMIN" in the last 72 hours.  No results for input(s): "LIPASE", "AMYLASE" in the last 72 hours. CBC: Recent Labs    04/19/22 0532 04/20/22 0520  WBC 5.2 5.9  HGB 7.5* 7.8*  HCT 23.9* 24.4*  MCV 89.2 88.4  PLT 212 234    Cardiac Enzymes: No results for input(s): "CKTOTAL", "CKMB", "CKMBINDEX", "TROPONINIHS" in the last 72 hours. BNP: No results for input(s): "BNP" in the last 72 hours. D-Dimer: No results for input(s): "DDIMER" in the last 72 hours. Hemoglobin A1C: No results for input(s): "HGBA1C" in the last 72 hours. Fasting Lipid Panel: No results for input(s): "CHOL", "HDL", "LDLCALC", "TRIG", "CHOLHDL", "LDLDIRECT" in the last 72 hours. Thyroid Function Tests: No results for input(s): "TSH", "T4TOTAL", "T3FREE", "THYROIDAB" in the last 72 hours.  Invalid input(s): "FREET3" Anemia Panel: No results for input(s): "VITAMINB12", "FOLATE", "FERRITIN", "TIBC", "IRON", "RETICCTPCT" in the last 72 hours.   Radiology: [facility] Chest Port 1 View  Result Date: 04/18/2022 CLINICAL DATA:  Follow-up left pleural effusion. EXAM: PORTABLE CHEST 1 VIEW COMPARISON:  04/15/2022 FINDINGS: Left-sided chest tube is in place and appears unchanged in position from previous exam. No pneumothorax identified. Unchanged opacification of the left lower lung which likely represents a combination of pleural effusion, airspace disease and atelectasis. Right lung appears clear IMPRESSION: 1. Stable position of left chest tube. No pneumothorax. 2. No change in aeration to the left lower lung. Electronically Signed   By: Kerby Moors M.D.   On: 04/18/2022 13:28   DG Chest Port 1 View  Result Date: 04/15/2022 CLINICAL DATA:  Left pleural effusion. EXAM: PORTABLE CHEST 1 VIEW COMPARISON:  April 14, 2022. FINDINGS: Stable cardiomegaly. Stable  left  basilar atelectasis or infiltrate is noted with associated pleural effusion. Minimal right basilar subsegmental atelectasis is noted. Bony thorax is unremarkable. IMPRESSION: Stable left basilar atelectasis or infiltrate is noted with associated pleural effusion. Minimal right basilar subsegmental atelectasis. Electronically Signed   By: Marijo Conception M.D.   On: 04/15/2022 14:49   DG Chest Port 1 View  Result Date: 04/14/2022 CLINICAL DATA:  Left-sided pleural effusion EXAM: PORTABLE CHEST 1 VIEW COMPARISON:  04/12/2022 FINDINGS: Cardiomegaly. Similar appearance of layering left pleural effusion and associated atelectasis or consolidation. Left-sided chest tube remains in position about the left lung base. No pneumothorax. No new airspace opacity. Osseous structures unremarkable. The visualized skeletal structures are unremarkable. IMPRESSION: 1. Similar appearance of layering left pleural effusion and associated atelectasis or consolidation. Left-sided chest tube remains in position about the left lung base. No pneumothorax. No new airspace opacity. 2. Cardiomegaly. Electronically Signed   By: Delanna Ahmadi M.D.   On: 04/14/2022 12:58   ECHOCARDIOGRAM COMPLETE  Result Date: 04/12/2022    ECHOCARDIOGRAM REPORT   Patient Name:   GUILHERME SCHWENKE Date of Exam: 04/12/2022 Medical Rec #:  [id number]      Height:       72.0 in Accession #:    [id number]     Weight:       305.8 lb Date of Birth:  1951/01/21      BSA:          2.552 m Patient Age:    57 years       BP:           136/99 mmHg Patient Gender: M              HR:           101 bpm. Exam Location:  [facility] Procedure: 2D Echo Indications:     CHF I50.21  History:         Patient has no prior history of Echocardiogram examinations.  Sonographer:     Kathlen Brunswick RDCS Referring Phys:  [id number] Sharen Hones Diagnosing Phys: Yolonda Kida MD  Sonographer Comments: Technically challenging study due to limited acoustic windows, patient is obese and suboptimal  apical window. Image acquisition challenging due to patient body habitus. Difficult and challenging study due to left side chest tube and  drain at the time of this study. IMPRESSIONS  1. Left ventricular ejection fraction, by estimation, is 45 to 50%. The left ventricle has mildly decreased function. The left ventricle demonstrates global hypokinesis. The left ventricular internal cavity size was moderately to severely dilated. There  is moderate concentric left ventricular hypertrophy. Left ventricular diastolic parameters are consistent with Grade II diastolic dysfunction (pseudonormalization).  2. Right ventricular systolic function is mildly reduced. The right ventricular size is moderately enlarged. Mildly increased right ventricular wall thickness.  3. Left atrial size was mildly dilated.  4. Right atrial size was mildly dilated.  5. Moderate pleural effusion in the left lateral region.  6. The mitral valve was not well visualized. Trivial mitral valve regurgitation.  7. The aortic valve is grossly normal. Aortic valve regurgitation is not visualized. Aortic valve sclerosis is present, with no evidence of aortic valve stenosis. FINDINGS  Left Ventricle: Left ventricular ejection fraction, by estimation, is 45 to 50%. The left ventricle has mildly decreased function. The left ventricle demonstrates global hypokinesis. The left ventricular internal cavity size was moderately to severely dilated. There is moderate concentric left ventricular hypertrophy. Left ventricular diastolic  parameters are consistent with Grade II diastolic dysfunction (pseudonormalization). Right Ventricle: The right ventricular size is moderately enlarged. Mildly increased right ventricular wall thickness. Right ventricular systolic function is mildly reduced. Left Atrium: Left atrial size was mildly dilated. Right Atrium: Right atrial size was mildly dilated. Pericardium: There is no evidence of pericardial effusion. Mitral Valve: The  mitral valve was not well visualized. Trivial mitral valve regurgitation. Tricuspid Valve: The tricuspid valve is not well visualized. Tricuspid valve regurgitation is mild. Aortic Valve: The aortic valve is grossly normal. Aortic valve regurgitation is not visualized. Aortic valve sclerosis is present, with no evidence of aortic valve stenosis. Aortic valve peak gradient measures 3.0 mmHg. Pulmonic Valve: The pulmonic valve was normal in structure. Pulmonic valve regurgitation is not visualized. Aorta: The aortic arch was not well visualized. IAS/Shunts: No atrial level shunt detected by color flow Doppler. Additional Comments: There is a moderate pleural effusion in the left lateral region.  LEFT VENTRICLE PLAX 2D LVIDd:         5.90 cm      Diastology LVIDs:         4.60 cm      LV e' medial:    8.49 cm/s LV PW:         1.50 cm      LV E/e' medial:  13.5 LV IVS:        1.60 cm      LV e' lateral:   8.49 cm/s LVOT diam:     2.30 cm      LV E/e' lateral: 13.5 LV SV:         43 LV SV Index:   17 LVOT Area:     4.15 cm  LV Volumes (MOD) LV vol d, MOD A4C: 254.0 ml LV vol s, MOD A4C: 175.0 ml LV SV MOD A4C:     254.0 ml LEFT ATRIUM           Index LA diam:      4.20 cm 1.65 cm/m LA Vol (A4C): 43.0 ml 16.85 ml/m  AORTIC VALVE                 PULMONIC VALVE AV Area (Vmax): 2.99 cm     PV Vmax:       0.67 m/s AV Vmax:        86.60 cm/s   PV Peak grad:  1.8 mmHg AV Peak Grad:   3.0 mmHg LVOT Vmax:      62.30 cm/s LVOT Vmean:     41.800 cm/s LVOT VTI:       0.103 m  AORTA Ao Root diam: 3.70 cm Ao Asc diam:  2.90 cm MITRAL VALVE MV Area (PHT): 6.90 cm     SHUNTS MV Decel Time: 110 msec     Systemic VTI:  0.10 m MV E velocity: 115.00 cm/s  Systemic Diam: 2.30 cm MV A velocity: 82.30 cm/s MV E/A ratio:  1.40 Yolonda Kida MD Electronically signed by Yolonda Kida MD Signature Date/Time: 04/12/2022/4:14:41 PM    Final    US Abdomen Complete  Result Date: 04/12/2022 CLINICAL DATA:  Cirrhosis with ascites. EXAM:  ABDOMEN ULTRASOUND COMPLETE COMPARISON:  Right upper quadrant ultrasound on 04/08/2022 FINDINGS: Gallbladder: Surgically absent. Common bile duct: Diameter: 7 mm, within normal limits post cholecystectomy. Liver: Suboptimal visualization due to patient habitus and limited acoustic windows. No focal liver lesion identified. Within normal limits in parenchymal echogenicity. Portal vein is patent on color Doppler imaging with  normal direction of blood flow towards the liver. IVC: No abnormality visualized. Pancreas: Not visualized due to patient habitus and bowel gas. Spleen: Could not be visualized due to bandages from left chest tube. Right Kidney: Length: 11.9 cm. Echogenicity within normal limits. No mass or hydronephrosis visualized. Left Kidney: Length: 11.8 cm. Echogenicity within normal limits. No mass or hydronephrosis visualized. Abdominal aorta: Not visualized due to overlying bowel gas. Other findings: No evidence of ascites. IMPRESSION: Technically limited exam due to patient habitus and bandages from chest tube. Prior cholecystectomy.  No evidence of biliary ductal dilatation. No definite hepatic abnormality identified.  No evidence of ascites. Electronically Signed   By: Marlaine Hind M.D.   On: 04/12/2022 12:44   DG Chest Port 1 View  Result Date: 04/12/2022 CLINICAL DATA:  Chest tube in place EXAM: PORTABLE CHEST 1 VIEW COMPARISON:  Chest x-ray April 12, 2022 FINDINGS: A left chest tube is in stable position. Persistent effusion with underlying opacity, similar to mildly worsened in the interval. No pneumothorax. No other changes. IMPRESSION: A left chest tube remains in place. Persistent effusion with underlying opacity, similar to mildly worsened in the interval. No pneumothorax. Electronically Signed   By: Dorise Bullion III M.D.   On: 04/12/2022 08:19   DG Chest Portable 1 View  Result Date: 04/12/2022 CLINICAL DATA:  Post chest tube placement EXAM: PORTABLE CHEST 1 VIEW COMPARISON:   Radiograph and CT 04/11/2022 FINDINGS: Decreased now small left pleural effusion after left basilar chest tube placement. Decreased left mid and lower lung atelectasis/consolidation. The right lung is clear. No pneumothorax. Stable cardiomediastinal silhouette. Aortic atherosclerotic calcification. IMPRESSION: Decreased now small left pleural effusion after left basilar chest tube placement. Electronically Signed   By: Placido Sou M.D.   On: 04/12/2022 00:31   CT Angio Chest PE W and/or Wo Contrast  Result Date: 04/11/2022 CLINICAL DATA:  Confusion and coughing up blood EXAM: CT ANGIOGRAPHY CHEST WITH CONTRAST TECHNIQUE: Multidetector CT imaging of the chest was performed using the standard protocol during bolus administration of intravenous contrast. Multiplanar CT image reconstructions and MIPs were obtained to evaluate the vascular anatomy. RADIATION DOSE REDUCTION: This exam was performed according to the departmental dose-optimization program which includes automated exposure control, adjustment of the mA and/or kV according to patient size and/or use of iterative reconstruction technique. CONTRAST:  30m OMNIPAQUE IOHEXOL 350 MG/ML SOLN COMPARISON:  CTA chest 10/15/2019 and radiographs earlier today FINDINGS: Cardiovascular: Satisfactory opacification of the pulmonary arteries to the segmental level. No evidence of pulmonary embolism. Hazy hyperdensity layering along the periphery of the left lower lobe. This is indeterminate on single phase exam and could be due to heterogenous perfusion in the setting of pneumonia however is suspicious for active contrast extravasation possibly related to recent thoracentesis and/or AVM as this appears rounded peripherally (circa series 6/image 255-268). No definite feeding vessel is seen. Cardiomegaly. No pericardial effusion. Coronary artery and aortic atherosclerotic calcification. Mediastinum/Nodes: Shotty mediastinal and hilar lymph nodes have increased since  10/15/2019. The largest is a precarinal node measuring 1.3 cm (4/47). These are favored reactive. Unremarkable esophagus. Lungs/Pleura: Large left pleural effusion some of which is hyperdense posteriorly suspicious for hemothorax. Hazy ground-glass opacities in the superior segment left lower lobe and low-attenuation consolidation in the inferior left lower lobe. These findings may be due to hemorrhage and/or pneumonia. Secretions/fluid within the left mainstem bronchus. The left lower lobe bronchi are not visualized. Secretions/fluid within the lingular bronchi. Atelectasis/consolidation in the lingula. Mild bronchial wall thickening  and clustered centrilobular micro nodularity in the right lung. Part solid and subsolid nodule in the right upper lobe measuring 1.0 cm with the solid component measuring 3 mm (6/66). Upper Abdomen: Cholecystectomy.  No acute abnormality. Musculoskeletal: No chest wall abnormality. No acute osseous findings. Review of the MIP images confirms the above findings. IMPRESSION: 1. Findings concerning for active hemorrhage in the left lower lobe possibly related to recent thoracentesis and/or AVM versus heterogenous perfusion in the setting of pneumonia. 2. Large left hemothorax. 3. Ground-glass and low-attenuation consolidation in the left lower lobe suspicious for pneumonia and/or alveolar hemorrhage. 4. Nonspecific secretions or blood within the left mainstem bronchus left lower lobe bronchi and lingula. 5. Part solid and subsolid nodule in the right upper lobe measuring 1.0 cm. Follow-up non-contrast CT recommended at 3-6 months to confirm persistence. If unchanged, and solid component remains <6 mm, annual CT is recommended until 5 years of stability has been established. If persistent these nodules should be considered highly suspicious if the solid component of the nodule is 6 mm or greater in size and enlarging. This recommendation follows the consensus statement: Guidelines for  Management of Incidental Pulmonary Nodules Detected on CT Images: From the Fleischner Society 2017; Radiology 2017; 284:228-243. Critical Value/emergent results were called by telephone at the time of interpretation on 04/11/2022 at 10:34 pm to provider PHILLIP STAFFORD , who verbally acknowledged these results. Electronically Signed   By: Placido Sou M.D.   On: 04/11/2022 22:50   DG Chest Portable 1 View  Result Date: 04/11/2022 CLINICAL DATA:  Shortness of breath and decreased left breath sounds EXAM: PORTABLE CHEST 1 VIEW COMPARISON:  Radiograph 04/02/2022 FINDINGS: Similar to increased large left pleural effusion and associated atelectasis/consolidation. The right lung is clear. No pneumothorax. Stable partially obscured cardiomediastinal silhouette. Aortic atherosclerotic calcification. IMPRESSION: Slightly increased large left pleural effusion and associated atelectasis/consolidation. Electronically Signed   By: Placido Sou M.D.   On: 04/11/2022 20:51   US Abdomen Limited RUQ (LIVER/GB)  Result Date: 04/08/2022 CLINICAL DATA:  Cirrhosis EXAM: ULTRASOUND ABDOMEN LIMITED RIGHT UPPER QUADRANT COMPARISON:  CT abdomen and pelvis April 07, 2011, renal ultrasound January 30, 2022 FINDINGS: Gallbladder: Surgically absent. Common bile duct: Diameter: 5.5 mm Liver: No focal lesion identified. Within normal limits in parenchymal echogenicity. Portal vein is patent on color Doppler imaging with normal direction of blood flow towards the liver. Other: None. IMPRESSION: No acute abnormality. Electronically Signed   By: Abelardo Diesel M.D.   On: 04/08/2022 09:12   DG Chest Port 1 View  Result Date: 04/02/2022 CLINICAL DATA:  status post thora EXAM: PORTABLE CHEST 1 VIEW COMPARISON:  Chest x-ray 03/24/2022. FINDINGS: Moderate to large left pleural effusion and overlying opacities. No visible pneumothorax. Partially obscured cardiomediastinal silhouette. No acute osseous abnormality. Polyarticular  degenerative change. IMPRESSION: Moderate to large left pleural effusion and overlying opacities. No visible pneumothorax. Electronically Signed   By: Margaretha Sheffield M.D.   On: 04/02/2022 09:29   US THORACENTESIS ASP PLEURAL SPACE W/IMG GUIDE  Result Date: 04/02/2022 INDICATION: Shortness of breath with recurrent left pleural effusion. EXAM: ULTRASOUND GUIDED LEFT THORACENTESIS MEDICATIONS: Local Lidocaine 1% only. COMPLICATIONS: None immediate. PROCEDURE: An ultrasound guided thoracentesis was thoroughly discussed with the patient and questions answered. The benefits, risks, alternatives and complications were also discussed. The patient understands and wishes to proceed with the procedure. Written consent was obtained. Ultrasound was performed to localize and mark an adequate pocket of fluid in the left chest. The area was then  prepped and draped in the normal sterile fashion. 1% Lidocaine was used for local anesthesia. Under ultrasound guidance a 19 gauge, 10-cm, Yueh catheter was introduced. Thoracentesis was performed. The catheter was removed and a dressing applied. FINDINGS: A total of approximately 1.5 L of clear yellow colored fluid was removed. Samples were sent to the laboratory as requested by the clinical team. IMPRESSION: Successful ultrasound guided left thoracentesis yielding 1.5 L of pleural fluid. This exam was performed by Tsosie Billing PA-C, and was supervised and interpreted by Dr. Annamaria Boots. Electronically Signed   By: Jerilynn Mages.  Shick M.D.   On: 04/02/2022 09:09   DG Chest Port 1 View  Result Date: 03/24/2022 CLINICAL DATA:  Post thoracentesis EXAM: PORTABLE CHEST 1 VIEW COMPARISON:  Chest XR, earlier same day and 03/13/2022. CT abdomen pelvis, 02/19/2022. FINDINGS: LEFT cardiac border is obscured. Aortic arch atherosclerosis. The RIGHT lung is well inflated. Interval decrease of prior large LEFT pleural effusion with small-to-moderate volume residual. LEFT basilar consolidative opacity, likely  atelectasis. No pneumothorax. No interval osseous abnormality. IMPRESSION: 1. Post thoracentesis with volume residual LEFT pleural effusion. No pneumothorax. 2. Consolidative LEFT basilar opacity, likely atelectasis. Electronically Signed   By: Michaelle Birks M.D.   On: 03/24/2022 15:45   US THORACENTESIS ASP PLEURAL SPACE W/IMG GUIDE  Result Date: 03/24/2022 INDICATION: Patient with complaint of dyspnea x3 months. During pulmonary visit today, patient was found to have large left pleural effusion. Patient was referred to IR for diagnostic and therapeutic LEFT thoracentesis. EXAM: ULTRASOUND GUIDED DIAGNOSTIC AND THERAPEUTIC LEFT THORACENTESIS MEDICATIONS: 10 mL 1 % lidocaine COMPLICATIONS: None immediate. PROCEDURE: An ultrasound guided thoracentesis was thoroughly discussed with the patient and questions answered. The benefits, risks, alternatives and complications were also discussed. The patient understands and wishes to proceed with the procedure. Written consent was obtained. Ultrasound was performed to localize and mark an adequate pocket of fluid in the LEFT chest. The area was then prepped and draped in the normal sterile fashion. 1% Lidocaine was used for local anesthesia. Under ultrasound guidance a 6 Fr Safe-T-Centesis catheter was introduced. Thoracentesis was performed. The catheter was removed and a dressing applied. FINDINGS: A total of approximately 1.6 L of clear, yellow fluid was removed. Samples were sent to the laboratory as requested by the clinical team. IMPRESSION: Successful diagnostic and therapeutic LEFT thoracentesis yielding 1.6 L of pleural fluid. Read by: Narda Rutherford, AGNP-BC Electronically Signed   By: Michaelle Birks M.D.   On: 03/24/2022 15:31    ECHO 03/2022 1. Left ventricular ejection fraction, by estimation, is 45 to 50%. The  left ventricle has mildly decreased function. The left ventricle  demonstrates global hypokinesis. The left ventricular internal cavity size  was  moderately to severely dilated. There   is moderate concentric left ventricular hypertrophy. Left ventricular  diastolic parameters are consistent with Grade II diastolic dysfunction  (pseudonormalization).   2. Right ventricular systolic function is mildly reduced. The right  ventricular size is moderately enlarged. Mildly increased right  ventricular wall thickness.   3. Left atrial size was mildly dilated.   4. Right atrial size was mildly dilated.   5. Moderate pleural effusion in the left lateral region.   6. The mitral valve was not well visualized. Trivial mitral valve  regurgitation.   7. The aortic valve is grossly normal. Aortic valve regurgitation is not  visualized. Aortic valve sclerosis is present, with no evidence of aortic  valve stenosis.   Exercise myoview 04/2021 Impression  Normal treadmill EKG without  evidence of ischemia or arrhythmia Mild global LV systolic dysfunction with ejection fraction of 40% Mild fixed inferior myocardial fusion defect consistent with previous infarct scar but more likely diaphragmatic attenuation Clinical correlation advised  TELEMETRY reviewed by me (LT) 04/20/2022 : NSR 70s-80s RBBB  EKG reviewed by me: Sinus tach, RBBB, nonspec ST changes  Data reviewed by me (LT) 04/20/2022: hospitalist, pulm, nephro progress notes, cbc, bmp, trops, bnp, vitals, tele    Principal Problem:   Influenza A Active Problems:   Iron deficiency anemia   Diabetes mellitus type 2 in obese (HCC)   CAD (coronary artery disease)   Essential (primary) hypertension   Hyperlipidemia, mixed   Obesity, Class III, BMI 40-49.9 (morbid obesity) ([place])   Chronic kidney disease due to type 2 diabetes mellitus ([place])   Edema of lower extremity   Acute on chronic systolic CHF (congestive heart failure) (HCC)   Acute renal failure superimposed on stage 2 chronic kidney disease (HCC)   Recurrent left pleural effusion   Acute hypoxemic respiratory failure (HCC)   Chronic  membranous glomerulonephritis    ASSESSMENT AND PLAN:  [name] is a 2yoM with a PMH of 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 Positive for influenza A. Febrile and tachycardic on admission, CTA chest with large left effusion s/p pigtail chest tube in the ED on 1/28. Hospital course complicated by [facility], AKI following diuresis, and anemia requiring 1 unit pRBCs & IV iron. Remains on supplemental O2 and a lasix infusion, chest tube with significant output.   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 S/p lasix infusion + abumin as below. -on torsemide '100mg'$  daily, spiro '50mg'$  daily -on tacrolimus per nephrology  # acute on chronic HFmrEF (45-50%, global hypo, g2dd 03/2022) Clinically hypervolemic, although much improving. 3+ edema in lower extremities on admission, still with supplemental oxygen requirement.  -s/p lasix gtt per nephrology, back to torsemide '100mg'$  once daily -restart GDMT as BP, renal function allows. Relative hypotension precluding this currently - on valsartan '150mg'$  daily at home, restart as BP allows. Intolerant of metoprolol & coreg (itching)  - will arrange for follow up with Dr. Saralyn Pilar at discharge  # CAD without chest pain, hx PCI to mid LAD (2001) # demand ischemia  Chest pain free, EKGs showing NSR, RBBB, nonspecific T wave changes. Troponins trended 738, 747, 1039, most consistent with demand/supply  mismatch and not ACS.  -once Hgb stable, restart aspirin '81mg'$  daily -continue atorvastatin '80mg'$  daily   # acute on chronic anemia S/p 1 unit PRBCS 1/30, IV iron  -hgb uptrend 7.2---7.5--7.8 continue to monitor closely   Cardiology will sign off. Please haiku with questions or re-engage if needed.    This patient's plan of care was discussed and created with Dr. Clayborn Bigness and he is in agreement.  Signed: Tristan Schroeder , PA-C 04/20/2022, 8:33 AM [facility]

## 2022-04-20 NOTE — Progress Notes (Signed)
[facility]  ROUNDING NOTE   Subjective:  Patient well-known to [name] as we follow him for idiopathic membranous glomerulonephritis with nephrotic syndrome, chronic kidney disease stage II with diabetes mellitus type 2, proteinuria, and lower extremity edema.  Patient came in with significant shortness of breath.  Found to be positive for the flu.  Has a left-sided chest tube in place.  Baseline creatinine 1.15 with EGFR of 67.  Most recent urine protein to creatinine ratio was down to 4.9.  He was to be evaluated by hematology for treatment with rituximab.  Patient seen laying in bed, remains on 2 L nasal cannula Tolerating meals without nausea and vomiting Lower extremity edema well-managed I will prescribe diuretics.  Objective:  Vital signs in last 24 hours:  Temp:  [97.9 F (36.6 C)-99 F (37.2 C)] 98.7 F (37.1 C) (02/05 1151) Pulse Rate:  [82-94] 87 (02/05 1151) Resp:  [16-20] 20 (02/05 1151) BP: (117-134)/(50-70) 117/70 (02/05 1151) SpO2:  [99 %-100 %] 100 % (02/05 1151) Weight:  [124.2 kg] 124.2 kg (02/05 0500)  Weight change: -0.4 kg Filed Weights   04/18/22 0404 04/19/22 0400 04/20/22 0500  Weight: 125.2 kg 124.6 kg 124.2 kg    Intake/Output: I/O last 3 completed shifts: In: 0  Out: 5065 [Urine:4375; Chest Tube:690]   Intake/Output this shift:  Total I/O In: 240 [P.O.:240] Out: 1225 [Urine:1150; Chest Tube:75]  Physical Exam: General: NAD  Head: Normocephalic, atraumatic. Moist oral mucosal membranes  Lungs:  Basilar wheeze, normal effort  Heart: S1S2 no rubs  Abdomen:  Soft, nontender, bowel sounds present  Extremities: 1+ brawny peripheral edema.  Neurologic: Awake, alert, following commands  Skin: No acute rash    Basic Metabolic Panel: Recent Labs  Lab 04/14/22 0506 04/15/22 0235 04/16/22 0445 04/17/22 0443 04/18/22 0455 04/19/22 0532 04/20/22 0520  NA 132* 135 137 137 138 141 140  K 3.8 3.8 3.5 4.0 3.6 3.6 3.7  CL 100 99 101 102  103 102 99  CO2 '26 29 26 29 27 31 '$ 32  GLUCOSE 141* 148* 114* 176* 134* 233* 215*  BUN 82* 83* 83* 77* 74* 64* 53*  CREATININE 2.16* 2.26* 2.08* 2.03* 1.84* 1.68* 1.45*  CALCIUM 7.4* 7.6* 7.7* 7.8* 7.7* 8.0* 8.0*  MG 2.4 2.4 2.5* 2.3 2.2  --   --   PHOS  --   --  4.6 3.6 2.9  --   --      Liver Function Tests: Recent Labs  Lab 04/15/22 0235  AST 20  ALT 11  ALKPHOS 60  BILITOT 1.2  PROT 5.6*  ALBUMIN 2.9*    No results for input(s): "LIPASE", "AMYLASE" in the last 168 hours. No results for input(s): "AMMONIA" in the last 168 hours.  CBC: Recent Labs  Lab 04/16/22 0445 04/17/22 0443 04/18/22 0455 04/19/22 0532 04/20/22 0520  WBC 4.1 6.0 5.0 5.2 5.9  HGB 7.2* 7.2* 7.2* 7.5* 7.8*  HCT 22.0* 22.4* 22.5* 23.9* 24.4*  MCV 88.4 89.6 88.9 89.2 88.4  PLT 196 182 192 212 234     Cardiac Enzymes: No results for input(s): "CKTOTAL", "CKMB", "CKMBINDEX", "TROPONINI" in the last 168 hours.  BNP: Invalid input(s): "POCBNP"  CBG: Recent Labs  Lab 04/19/22 1208 04/19/22 1600 04/19/22 2056 04/20/22 0803 04/20/22 1153  GLUCAP 234* 230* 245* 184* 50*     Microbiology: Results for orders placed or performed during the hospital encounter of 04/11/22  Resp panel by RT-PCR (RSV, Flu A&B, Covid) Anterior Nasal Swab  Status: Abnormal   Collection Time: 04/11/22  8:34 PM   Specimen: Anterior Nasal Swab  Result Value Ref Range Status   SARS Coronavirus 2 by RT PCR NEGATIVE NEGATIVE Final    Comment: (NOTE) SARS-CoV-2 target nucleic acids are NOT DETECTED.  The SARS-CoV-2 RNA is generally detectable in upper respiratory specimens during the acute phase of infection. The lowest concentration of SARS-CoV-2 viral copies this assay can detect is 138 copies/mL. A negative result does not preclude SARS-Cov-2 infection and should not be used as the sole basis for treatment or other patient management decisions. A negative result may occur with  improper specimen  collection/handling, submission of specimen other than nasopharyngeal swab, presence of viral mutation(s) within the areas targeted by this assay, and inadequate number of viral copies(<138 copies/mL). A negative result must be combined with clinical observations, patient history, and epidemiological information. The expected result is Negative.  Fact Sheet for Patients:  EntrepreneurPulse.com.au  Fact Sheet for Healthcare Providers:  IncredibleEmployment.be  This test is no t yet approved or cleared by the [place] FDA and  has been authorized for detection and/or diagnosis of SARS-CoV-2 by FDA under an Emergency Use Authorization (EUA). This EUA will remain  in effect (meaning this test can be used) for the duration of the COVID-19 declaration under Section 564(b)(1) of the Act, 21 U.S.C.section 360bbb-3(b)(1), unless the authorization is terminated  or revoked sooner.       Influenza A by PCR POSITIVE (A) NEGATIVE Final   Influenza B by PCR NEGATIVE NEGATIVE Final    Comment: (NOTE) The Xpert Xpress SARS-CoV-2/FLU/RSV plus assay is intended as an aid in the diagnosis of influenza from Nasopharyngeal swab specimens and should not be used as a sole basis for treatment. Nasal washings and aspirates are unacceptable for Xpert Xpress SARS-CoV-2/FLU/RSV testing.  Fact Sheet for Patients: EntrepreneurPulse.com.au  Fact Sheet for Healthcare Providers: IncredibleEmployment.be  This test is not yet approved or cleared by the [place] FDA and has been authorized for detection and/or diagnosis of SARS-CoV-2 by FDA under an Emergency Use Authorization (EUA). This EUA will remain in effect (meaning this test can be used) for the duration of the COVID-19 declaration under Section 564(b)(1) of the Act, 21 U.S.C. section 360bbb-3(b)(1), unless the authorization is terminated or revoked.     Resp Syncytial  Virus by PCR NEGATIVE NEGATIVE Final    Comment: (NOTE) Fact Sheet for Patients: EntrepreneurPulse.com.au  Fact Sheet for Healthcare Providers: IncredibleEmployment.be  This test is not yet approved or cleared by the [place] FDA and has been authorized for detection and/or diagnosis of SARS-CoV-2 by FDA under an Emergency Use Authorization (EUA). This EUA will remain in effect (meaning this test can be used) for the duration of the COVID-19 declaration under Section 564(b)(1) of the Act, 21 U.S.C. section 360bbb-3(b)(1), unless the authorization is terminated or revoked.  Performed at [facility], [address] [place], [place] [address]   Culture, blood (routine x 2)     Status: None   Collection Time: 04/11/22  9:59 PM   Specimen: BLOOD LEFT ARM  Result Value Ref Range Status   Specimen Description BLOOD LEFT ARM  Final   Special Requests   Final    BOTTLES DRAWN AEROBIC AND ANAEROBIC Blood Culture adequate volume   Culture   Final    NO GROWTH 5 DAYS Performed at [facility], [address] [place], [place] [address]    Report Status 04/16/2022 FINAL  Final  Culture, blood (routine x 2)     Status: None   Collection Time: 04/11/22 10:01 PM   Specimen: BLOOD LEFT HAND  Result Value Ref Range Status   Specimen Description BLOOD LEFT HAND  Final   Special Requests   Final    BOTTLES DRAWN AEROBIC AND ANAEROBIC Blood Culture results may not be optimal due to an inadequate volume of blood received in culture bottles   Culture   Final    NO GROWTH 5 DAYS Performed at [facility], [address] [place], [place] [address]    Report Status 04/16/2022 FINAL  Final    Coagulation Studies: No results for input(s): "LABPROT", "INR" in the last 72 hours.  Urinalysis: No results for input(s): "COLORURINE", "LABSPEC", "PHURINE", "GLUCOSEU", "HGBUR", "BILIRUBINUR", "KETONESUR", "PROTEINUR",  "UROBILINOGEN", "NITRITE", "LEUKOCYTESUR" in the last 72 hours.  Invalid input(s): "APPERANCEUR"    Imaging: No results found.   Medications:    methocarbamol (ROBAXIN) IV      sodium chloride   Intravenous Once   vitamin C  500 mg Oral Daily   atorvastatin  80 mg Oral Daily   bisacodyl  10 mg Oral QHS   Chlorhexidine Gluconate Cloth  6 each Topical Daily   enoxaparin (LOVENOX) injection  0.5 mg/kg Subcutaneous Q24H   guaiFENesin  600 mg Oral BID   insulin aspart  0-15 Units Subcutaneous TID WC   insulin aspart  0-5 Units Subcutaneous QHS   insulin glargine-yfgn  25 Units Subcutaneous QHS   ipratropium-albuterol  3 mL Nebulization BID   iron polysaccharides  150 mg Oral Daily   polyethylene glycol  17 g Oral BID   sodium chloride flush  3 mL Intravenous Q12H   spironolactone  50 mg Oral Daily   tacrolimus  1 mg Oral BID   tamsulosin  0.4 mg Oral QPC supper   torsemide  100 mg Oral Daily   Vitamin D (Ergocalciferol)  50,000 Units Oral Q7 days   acetaminophen **OR** acetaminophen, bisacodyl, guaiFENesin-dextromethorphan, HYDROmorphone (DILAUDID) injection, methocarbamol (ROBAXIN) IV, ondansetron **OR** ondansetron (ZOFRAN) IV, oxyCODONE  Assessment/ Plan:  72 y.o. male with past medical history of diabetes mellitus type 2, morbid obesity, hypertension, coronary artery disease status post stent placement, cardiomyopathy, low testosterone, hyperlipidemia, history of first left toe osteomyelitis with amputation, hydronephrosis, osteoarthritis of the right hip, history of idiopathic membranous glomerulonephritis who presents with increasing shortness of breath.  1.  Idiopathic membranous glomerulonephritis/ Acute kidney injury on chronic kidney disease stage II/proteinuria most recent urine protein to creatinine ratio 4.9.  Patient was in the process of being evaluated for rituximab treatment of his idiopathic membranous glomerulonephritis. -Hold off on administering rituximab at  this time.  Tacrolimus '1mg'$  twice daily started yesterday.  Tacrolimus level scheduled for Wednesday.  -Creatinine slowly returning to baseline.  2. Lower extremity edema. Edema greatly improved. Continue oral diuresis.    3.  Anemia of chronic kidney disease.  Hemoglobin slightly improved today, 7.8.  4.  Acute on chronic combined systolic and diastolic CHF, acute hypoxemic respiratory failure, volume overload, coronary artery disease. Volume management with oral diuresis. Recurrent left-sided pleural effusion secondary to nephrotic syndrome, third spacing of fluid.  Left chest tube in place.  Will follow-up with Dr. Saralyn Pilar at discharge.  5.  Influenza A Completed Tamiflu. Continue supportive care.    LOS: [place] 2/5/202412:32 PM

## 2022-04-20 NOTE — Progress Notes (Signed)
Occupational Therapy Treatment Patient Details Name: [name] MRN: [id number] DOB: 07-08-1950 Today's Date: 04/20/2022   History of present illness Pt is a 72 y.o. male with medical history significant of arthritis, CHF, CAD, DM2, HLD, HTN, treated melanoma, and spinal stenosis who presents for worsening cough, fatigue and SOB. MD assessment includes: Influenza A, hypoxic respiratory failure, hemothorax s/p L chest tube placement, AKI, acute on chronic anemia, hyperglycemia, type 2 NSTEMI related to hemothorax, blood loss, and tachycardia, and edema of the LE.   OT comments  [name] continues to make progress yet remains far from his baseline level of fxl mobility. During today's session he transferred supine<sit and sit<stand from bed at elevated height, requiring increased time and effort but no physical assistance from therapist. He transferred to recliner with RW; fair balance; making slow, shuffling steps. From the lower recliner surface, pt performed multiple sit<>stand transfers, requiring Min A and needing to make multiple rocking attempts before coming up fully into standing. Pt declined standing therex, citing fatigue, and instead performed LE and UE therex in sitting. SpO2 and HR WNL throughout session, on room air. Will continue to follow PoC. Given that pt is significantly off his PLOF, continue to recommend DC to a SNF.    Recommendations for follow up therapy are one component of a multi-disciplinary discharge planning process, led by the attending physician.  Recommendations may be updated based on patient status, additional functional criteria and insurance authorization.    Follow Up Recommendations  Skilled nursing-short term rehab (<3 hours/day)     Assistance Recommended at Discharge Frequent or constant Supervision/Assistance  Patient can return home with the following  A lot of help with bathing/dressing/bathroom;Assistance with cooking/housework;Assist for  transportation;Help with stairs or ramp for entrance;A little help with walking and/or transfers   Equipment Recommendations       Recommendations for Other Services      Precautions / Restrictions Precautions Precautions: Fall Restrictions Weight Bearing Restrictions: No Other Position/Activity Restrictions: L chest tube       Mobility Bed Mobility Overal bed mobility: Needs Assistance Bed Mobility: Supine to Sit     Supine to sit: Supervision     General bed mobility comments: Extra time, effort, and use of bed rail required with assist to manage lines/tubes    Transfers Overall transfer level: Needs assistance Equipment used: Rolling walker (2 wheels) Transfers: Sit to/from Stand, Bed to chair/wheelchair/BSC Sit to Stand: Min guard, From elevated surface     Step pivot transfers: Min guard     General transfer comment: Min verbal cues for sequencing; close SUPV for safety     Balance Overall balance assessment: Needs assistance Sitting-balance support: Feet supported Sitting balance-Leahy Scale: Normal     Standing balance support: Bilateral upper extremity supported, During functional activity, Reliant on assistive device for balance Standing balance-Leahy Scale: Fair Standing balance comment: fatigues quickly in standing                           ADL either performed or assessed with clinical judgement   ADL Overall ADL's : Needs assistance/impaired       Grooming Details (indicate cue type and reason): anticipate Min A in standing         Upper Body Dressing : Minimal assistance;Sitting Upper Body Dressing Details (indicate cue type and reason): anticipate Lower Body Dressing: Maximal assistance;Sitting/lateral leans Lower Body Dressing Details (indicate cue type and reason): socks (normally wears slip  on shoes)                    Extremity/Trunk Assessment Upper Extremity Assessment Upper Extremity Assessment: Generalized  weakness   Lower Extremity Assessment Lower Extremity Assessment: Generalized weakness        Vision       Perception     Praxis      Cognition Arousal/Alertness: Awake/alert Behavior During Therapy: WFL for tasks assessed/performed Overall Cognitive Status: Within Functional Limits for tasks assessed                                          Exercises Other Exercises Other Exercises: Therex in sitting. Educ re: PoC, DC recs, home safety    Shoulder Instructions       General Comments      Pertinent Vitals/ Pain       Pain Assessment Pain Assessment: No/denies pain  Home Living                                          Prior Functioning/Environment              Frequency  Min 2X/week        Progress Toward Goals  OT Goals(current goals can now be found in the care plan section)  Progress towards OT goals: Progressing toward goals  Acute Rehab OT Goals OT Goal Formulation: With patient Time For Goal Achievement: 04/29/22 Potential to Achieve Goals: Good  Plan Discharge plan remains appropriate;Frequency remains appropriate    Co-evaluation                 AM-PAC OT "6 Clicks" Daily Activity     Outcome Measure   Help from another person eating meals?: None Help from another person taking care of personal grooming?: A Little Help from another person toileting, which includes using toliet, bedpan, or urinal?: A Lot Help from another person bathing (including washing, rinsing, drying)?: A Lot Help from another person to put on and taking off regular upper body clothing?: A Little Help from another person to put on and taking off regular lower body clothing?: A Lot 6 Click Score: 16    End of Session Equipment Utilized During Treatment: Rolling walker (2 wheels);Oxygen  OT Visit Diagnosis: Other abnormalities of gait and mobility (R26.89);Muscle weakness (generalized) (M62.81)   Activity Tolerance  Patient tolerated treatment well   Patient Left in chair;with call bell/phone within reach   Nurse Communication Mobility status        Time: 4854-6270 OT Time Calculation (min): 15 min  Charges: OT General Charges $OT Visit: 1 Visit OT Treatments $Self Care/Home Management : 8-22 mins  Josiah Lobo, PhD, MS, OTR/L 04/20/22, 3:19 PM

## 2022-04-20 NOTE — Progress Notes (Signed)
Physical Therapy Treatment Patient Details Name: [name] MRN: [id number] DOB: 1950-04-30 Today's Date: 04/20/2022   History of Present Illness Pt is a 72 y.o. male with medical history significant of arthritis, CHF, CAD, DM2, HLD, HTN, treated melanoma, and spinal stenosis who presents for worsening cough, fatigue and SOB. MD assessment includes: Influenza A, hypoxic respiratory failure, hemothorax s/p L chest tube placement, AKI, acute on chronic anemia, hyperglycemia, type 2 NSTEMI related to hemothorax, blood loss, and tachycardia, and edema of the LE.    PT Comments    Pt was pleasant and motivated to participate during the session and put forth good effort throughout. Chest tube assessed pre and post session with no concerns noted.  Pt required no physical assistance during the session but did require extra time and effort with bed mobility tasks and for the bed to be elevated to come to standing.  Once in standing pt was able to weight shift left/right and take several very small, shuffling steps towards the [facility] but distance was very limited due to weakness.  No adverse symptoms noted during the session with SpO2 and HR WNL on room air.  Pt will benefit from PT services in a SNF setting upon discharge to safely address deficits listed in patient problem list for decreased caregiver assistance and eventual return to PLOF.     Recommendations for follow up therapy are one component of a multi-disciplinary discharge planning process, led by the attending physician.  Recommendations may be updated based on patient status, additional functional criteria and insurance authorization.  Follow Up Recommendations  Skilled nursing-short term rehab (<3 hours/day) Can patient physically be transported by private vehicle: No   Assistance Recommended at Discharge Frequent or constant Supervision/Assistance  Patient can return home with the following A lot of help with walking and/or transfers;A lot  of help with bathing/dressing/bathroom;Assistance with cooking/housework;Assist for transportation;Help with stairs or ramp for entrance   Equipment Recommendations  Other (comment) (TBD at next venue of care)    Recommendations for Other Services       Precautions / Restrictions Precautions Precautions: Fall Restrictions Weight Bearing Restrictions: No Other Position/Activity Restrictions: L chest tube     Mobility  Bed Mobility Overal bed mobility: Needs Assistance Bed Mobility: Supine to Sit, Sit to Supine     Supine to sit: Supervision Sit to supine: Supervision   General bed mobility comments: Extra time, effort, and use of bed rail required with assist to manage lines/tubes    Transfers Overall transfer level: Needs assistance Equipment used: Rolling walker (2 wheels) Transfers: Sit to/from Stand Sit to Stand: Min guard, From elevated surface           General transfer comment: Min verbal cues for sequencing    Ambulation/Gait Ambulation/Gait assistance: Min guard Gait Distance (Feet): 2 Feet Assistive device: Rolling walker (2 wheels) Gait Pattern/deviations: Step-to pattern, Shuffle, Trunk flexed Gait velocity: decreased     General Gait Details: Pt only able to take several small, effortful, shuffling steps at the EOB with close CGA   Stairs             Wheelchair Mobility    Modified Rankin (Stroke Patients Only)       Balance Overall balance assessment: Needs assistance Sitting-balance support: Feet supported Sitting balance-Leahy Scale: Normal     Standing balance support: Bilateral upper extremity supported, During functional activity, Reliant on assistive device for balance Standing balance-Leahy Scale: Fair Standing balance comment: fatigues quickly in standing  Cognition Arousal/Alertness: Awake/alert Behavior During Therapy: WFL for tasks assessed/performed Overall Cognitive Status:  Within Functional Limits for tasks assessed                                          Exercises Total Joint Exercises Ankle Circles/Pumps: Strengthening, Both, 10 reps (with manual resistance) Quad Sets: Strengthening, Both, 10 reps Gluteal Sets: Strengthening, Both, 10 reps Heel Slides: Strengthening, Both, 5 reps Hip ABduction/ADduction: Strengthening, Both, 10 reps Straight Leg Raises: Strengthening, Both, 10 reps Long Arc Quad: AROM, Strengthening, Both, 10 reps Knee Flexion: AROM, Strengthening, Both, 10 reps Bridges: Strengthening, Both, 10 reps (min amplitude)    General Comments        Pertinent Vitals/Pain Pain Assessment Pain Assessment: No/denies pain    Home Living                          Prior Function            PT Goals (current goals can now be found in the care plan section) Progress towards PT goals: PT to reassess next treatment    Frequency    Min 2X/week      PT Plan Current plan remains appropriate    Co-evaluation              AM-PAC PT "6 Clicks" Mobility   Outcome Measure  Help needed turning from your back to your side while in a flat bed without using bedrails?: A Little Help needed moving from lying on your back to sitting on the side of a flat bed without using bedrails?: A Little Help needed moving to and from a bed to a chair (including a wheelchair)?: A Little Help needed standing up from a chair using your arms (e.g., wheelchair or bedside chair)?: A Little Help needed to walk in hospital room?: A Lot Help needed climbing 3-5 steps with a railing? : Total 6 Click Score: 15    End of Session   Activity Tolerance: Patient tolerated treatment well Patient left: in bed;with call bell/phone within reach;with bed alarm set (pt declined up in chair) Nurse Communication: Mobility status PT Visit Diagnosis: Unsteadiness on feet (R26.81);Muscle weakness (generalized) (M62.81);Difficulty in walking,  not elsewhere classified (R26.2)     Time: 1583-0940 PT Time Calculation (min) (ACUTE ONLY): 28 min  Charges:  $Therapeutic Exercise: 8-22 mins $Therapeutic Activity: 8-22 mins                     D. Scott Kye Hedden PT, DPT 04/20/22, 12:19 PM

## 2022-04-20 NOTE — TOC Progression Note (Addendum)
Transition of Care [facility]) - Progression Note    Patient Details  Name: [name] MRN: [id number] Date of Birth: [birth year]  Transition of Care [facility]) CM/SW Contact  Laurena Slimmer, RN Phone Number: 04/20/2022, 1:29 PM  Clinical Narrative:    Spoke with patient's son, [name] patient regarding SNF. Patient is agreeable to SNF and would like a list of local SNF's.  Spoke with patient's son. Provided a list of local facilities via [email or web address]. [name] was advised of process for obtaining a bed and needed insurance approval. He would like to research facilites and will make a choice.   [place] [facility] obtained FL2 completed  Bed search initiated.        Expected Discharge Plan and Services                                               Social Determinants of Health (SDOH) Interventions SDOH Screenings   Food Insecurity: No Food Insecurity (04/13/2022)  Housing: Low Risk  (04/13/2022)  Transportation Needs: No Transportation Needs (04/13/2022)  Utilities: Not At Risk (04/13/2022)  Depression (PHQ2-9): Low Risk  (02/27/2022)  Recent Concern: Depression (PHQ2-9) - Medium Risk (02/02/2022)  Tobacco Use: Low Risk  (04/13/2022)    Readmission Risk Interventions     No data to display

## 2022-04-20 NOTE — Progress Notes (Signed)
[facility] Progress Note  Patient: [name]    HWE:[id number]  DOA: 04/11/2022     Date of Service: the patient was seen and examined on 04/20/2022  Chief Complaint  Patient presents with   Shortness of Breath   Brief hospital course: [name] is a 72 y.o. male with PMH of arthritis, CHF (chronic), CAD, DM2, HLD, HTN, h/o treated melanoma, spinal stenosis who presents for worsening cough, fatigue and sob. Had low-grade fever, he was found to have positive for influenza A. He had recurrent left-sided pleural effusion, had a thoracentesis x 2 during the last months.  Upon arrival emergency room, chest x-ray showed large left-sided pleural effusion again, chest tube was placed by ED physician. Patient also developed significant hypoxemia with oxygen saturation as low as 76% on room air this morning.  Treated with oxygen. Patient has severe volume overload with evidence of bichamber heart failure.  Echocardiogram showed EF 45-50% with grade 2 diastolic dysfunction. Pulm valve not visualized.  Furosemide drip started after seen by nephrology.   1/29.  Patient had minimal urine after Lasix, but chest tube had drained additional 1500 mL in 24 hours.    Assessment and Plan: Acute on chronic combined systolic and diastolic congestive heart failure Acute hypoxemic respiratory failure secondary to volume overload. Elevated troponin with NSTEMI. Coronary artery disease. Continue supplemental O2 nation and gradually wean off Biventricular heart failure, significant volume overload, edema of bilateral lower extremities, elevated BNP Abdominal ultrasound did not show any ascites. Nephro and cardiology consulted S/p Lasix IV infusion, discontinued on 2/2, started torsemide 100 mg p.o. daily and Aldactone 50 mg p.o. daily Patient was on valsartan 150 mg p.o. daily at home, cardiology recommended to restart as BP allows.  Patient has intolerance to metoprolol and Coreg (itching).  Cardio  signed off. Good urine output after Foley catheter insertion   Recurrent left side pleural effusion.  Hemothorax ruled out. Chest tube was placed in the emergency room, removed 2 L of serous fluids.  No evidence of bleeding.  Based on prior lab test, cytology was negative.  Prior test also does not indicate infection.  Reviewed chest x-ray post chest tube, no evidence of pneumonia. Procalcitonin level not significant elevated at 0.2 with chronic renal function.  No evidence of infection.  Patient is followed by pulmonology, patient appears to have rapid accumulation of pleural fluid.  Had a discussion with CT surgery, pulmonology and nephrology.  Patient still has a large amount of the drain from the chest tube, patient cannot be discharged with chest tube in place.  The source of increased pleural effusion is due to heart failure, third spacing from renal disease.  Patient will be given albumin, diuretics.  If this does not improve, we may have to use Pleurx catheter. 2/3 follow repeat chest x-ray  Influenza A. S/p Tamiflu for 5 days, last dose on 04/16/22 2/1 started Mucinex 600 mg p.o. twice daily due to chest congestion   Acute kidney injury on chronic kidney disease stage II. Idiopathic membranous glomerulonephritis. Hyponatremia. Patient has a worsening renal function on chronic renal disease.  He is grossly volume overloaded, he is started on Lasix drip.  Renal function is gradually getting worse.  There is a possibility that patient may need a dialysis. Nephrology is working with oncology to start rituximab.  2/4 started tacrolimus 1 mg p.o. twice daily until patient is able to take rituximab 2/5 Creatinine 1.45, gradually improving  Iron deficient anemia. Acute blood loss  anemia ruled out. Patient received IV iron, hemoglobin dropped down to 6.9, chest tube fluids does not seem to be bloody.  s/p 1 unit PRBC.  No evidence of GI bleed. Hb 7.2 --7.5--7.8 Continue to monitor H&H and  transfuse if hemoglobin less than 7   Uncontrolled type 2 diabetes with hyperglycemia Continue long-acting insulin, sliding scale insulin.   Essential hypertension Continue to monitor.   Morbid obesity BMI 41.47, Likely obstructive sleep apnea. Diet exercise. Will decide if patient need BiPAP before discharge.   Urinary retention, Foley catheter was inserted on 04/16/2022 Started Flomax Continue Foley catheter for 1 week and then DC when patient is more ambulatory  Vitamin D deficiency: started vitamin D 50,000 units p.o. weekly, follow with PCP to repeat vitamin D level after 3 to 6 months.   Body mass index is 41.47 kg/m.  Interventions:       Diet: Carb modified diet DVT Prophylaxis: Subcutaneous Lovenox   Advance goals of care discussion: Full code  Family Communication: family was not present at bedside, at the time of interview.  The pt provided permission to discuss medical plan with the family. Opportunity was given to ask question and all questions were answered satisfactorily.   Disposition:  Pt is from Home, admitted with anasarca and left pleural effusion, still has left-sided chest tube, which precludes a safe discharge. Discharge to SNF, when clinically stable, needs pulmonary clearance.  Subjective: No significant events overnight, in the morning patient was working with PT and OT, sitting at the edge of the bed, feeling little bit dizzy, no chest pain or palpitation, no shortness of breath.  Pain is under control.  Patient is feeling improvement in the lower extremity edema.  Still has chest tube intact.   Physical Exam: General: NAD, lying comfortably Appear in no distress, affect appropriate Eyes: PERRLA ENT: Oral Mucosa Clear, moist  Neck: no JVD,  Cardiovascular: S1 and S2 Present, no Murmur,  Respiratory: good respiratory effort, Bilateral Air entry equal and Decreased, bilateral crackles, decreased breath sounds in the left lower chest, no  wheezing, left chest tube intact Abdomen: Bowel Sound present, Soft and no tenderness,  Skin: no rashes Extremities: 2-3+ Pedal edema, no calf tenderness.  Edema is gradually improving Neurologic: without any new focal findings Gait not checked due to patient safety concerns  Vitals:   04/20/22 0500 04/20/22 0732 04/20/22 0802 04/20/22 1151  BP:   (!) 121/52 117/70  Pulse:   94 87  Resp:   16 20  Temp:   98.6 F (37 C) 98.7 F (37.1 C)  TempSrc:   Oral   SpO2:  99% 100% 100%  Weight: 124.2 kg     Height:        Intake/Output Summary (Last 24 hours) at 04/20/2022 1456 Last data filed at 04/20/2022 1100 Gross per 24 hour  Intake 240 ml  Output 2640 ml  Net -2400 ml   Filed Weights   04/18/22 0404 04/19/22 0400 04/20/22 0500  Weight: 125.2 kg 124.6 kg 124.2 kg    Data Reviewed: I have personally reviewed and interpreted daily labs, tele strips, imagings as discussed above. I reviewed all nursing notes, pharmacy notes, vitals, pertinent old records I have discussed plan of care as described above with RN and patient/family.  CBC: Recent Labs  Lab 04/16/22 0445 04/17/22 0443 04/18/22 0455 04/19/22 0532 04/20/22 0520  WBC 4.1 6.0 5.0 5.2 5.9  HGB 7.2* 7.2* 7.2* 7.5* 7.8*  HCT 22.0* 22.4* 22.5* 23.9* 24.4*  MCV 88.4 89.6 88.9 89.2 88.4  PLT 196 182 192 212 381   Basic Metabolic Panel: Recent Labs  Lab 04/14/22 0506 04/15/22 0235 04/16/22 0445 04/17/22 0443 04/18/22 0455 04/19/22 0532 04/20/22 0520  NA 132* 135 137 137 138 141 140  K 3.8 3.8 3.5 4.0 3.6 3.6 3.7  CL 100 99 101 102 103 102 99  CO2 '26 29 26 29 27 31 '$ 32  GLUCOSE 141* 148* 114* 176* 134* 233* 215*  BUN 82* 83* 83* 77* 74* 64* 53*  CREATININE 2.16* 2.26* 2.08* 2.03* 1.84* 1.68* 1.45*  CALCIUM 7.4* 7.6* 7.7* 7.8* 7.7* 8.0* 8.0*  MG 2.4 2.4 2.5* 2.3 2.2  --   --   PHOS  --   --  4.6 3.6 2.9  --   --     Studies: No results found.  Scheduled Meds:  sodium chloride   Intravenous Once   vitamin  C  500 mg Oral Daily   atorvastatin  80 mg Oral Daily   bisacodyl  10 mg Oral QHS   Chlorhexidine Gluconate Cloth  6 each Topical Daily   enoxaparin (LOVENOX) injection  0.5 mg/kg Subcutaneous Q24H   guaiFENesin  600 mg Oral BID   insulin aspart  0-15 Units Subcutaneous TID WC   insulin aspart  0-5 Units Subcutaneous QHS   insulin glargine-yfgn  25 Units Subcutaneous QHS   ipratropium-albuterol  3 mL Nebulization BID   iron polysaccharides  150 mg Oral Daily   polyethylene glycol  17 g Oral BID   sodium chloride flush  3 mL Intravenous Q12H   spironolactone  50 mg Oral Daily   tacrolimus  1 mg Oral BID   tamsulosin  0.4 mg Oral QPC supper   torsemide  100 mg Oral Daily   Vitamin D (Ergocalciferol)  50,000 Units Oral Q7 days   Continuous Infusions:  methocarbamol (ROBAXIN) IV     PRN Meds: acetaminophen **OR** acetaminophen, bisacodyl, guaiFENesin-dextromethorphan, HYDROmorphone (DILAUDID) injection, methocarbamol (ROBAXIN) IV, ondansetron **OR** ondansetron (ZOFRAN) IV, oxyCODONE  Time spent: 35 minutes  Author: Val Riles. MD [facility] 04/20/2022 2:56 PM  To reach On-call, see care teams to locate the attending and reach out to them via [email or web address]. If 7PM-7AM, please contact night-coverage If you still have difficulty reaching the attending provider, please page the [facility] (Director on Call) for [facility] on amion for assistance.

## 2022-04-20 NOTE — Progress Notes (Signed)
PULMONOLOGY         Date: 04/20/2022,   MRN# [id number] [name] 03-Sep-1950     AdmissionWeight: (!) 138.7 kg                 CurrentWeight: 124.2 kg  Referring provider: Dr Roosevelt Locks   CHIEF COMPLAINT:   Parapneumonic effusion s/p chest tube placement    HISTORY OF PRESENT ILLNESS   This is a pleasant 72 year old male with severe morbid obesity chronic 3+ weeping lower extremity edema due to CKD with Membranous glomerulonephritis disease and recent evaluation with nephrology for rituximab infusion, history of CHF with reduced EF at 40%, osteoarthritis, diabetes, history of melanoma, neuropathy, spinal stenosis with chief complaint of shortness of breath and chest discomfort.  Patient is well-known to our pulmonology service with [facility] we recently saw him for acute on chronic hypoxemic respiratory failure noted to have left-sided large effusion and right-sided mild to moderate effusion.  Status postthoracentesis x 2 with over 1 L drained.  This is a third time that he is shown to have a large pleural effusion in stent 3 months.  He did receive pleural drain upon arrival to the ER with 1500 cc serosanguineous return overnight and active drainage this a.m. and up to 150 cc before lunchtime.  On interview and physical exam patient reports slight improvement mild chest discomfort at pigtail insertion site.  Chest tube management displays no airleak upon forced expiratory maneuver.   04/14/22- patient examined at bedside, s/p repeat CBC with severe anemia requiring PRBC transfusion. S/p [place] abd no ascites. Chest tube in place on left , persistent and recurrent effusion present on CXR 1/28.  Will repeat CXR again today.  On Lasix gtt with 600cc urine overnight. Development of transient hypotension with req for midodrine.   04/15/22-  patient reports nausea. He is on oxycodone.  He appears less swollen today. Overall he is net 1.3L negative and has active fluid per Chest  tube. He needs to start using incentive spirometer today.    04/16/22- patient had 150cc this am per chest tube.  Patient reports improvement clinically.  Continue chest tube for now.   04/17/22- patient on 2L/min nasal canula.  He still has straw colored phlegm returning from chest tube.  He coughed up phlegm with blood/rusty colored. He had obstructiive urophathy and had foley placed feels a lot better now. He is constipated but did have BM today shares he is much improved.  04/18/22- patient is resting in bed.  Still has straw colored return overnight >250cc which will likely continue.  He had cardiology DR [name] come to evaluate him today.   Renal function seems to be improving and I will repeat cxr today for interval effusion changes.  04/19/22- patient aspirated today and was coughing a lot during my evaluation.  Have ordered extra neb treatment.  He is on 2L/min [place].  Overnight >100cc return to chest tube, still unable to come out.  Have ordered physical therapy.   04/20/22- patient with <50cc overnight from chest tube. No air leak noted. He is with nonlabored breathing. He is on 2L/min [place] but using long cordage and is only receiving 1L to nares.  He's urinating well on aldactone 50/demadex 100 daily with Net negative 13L.  Renal function is improved. He's able to ambulate now.    PAST MEDICAL HISTORY   Past Medical History:  Diagnosis Date   Arthritis    CHF (congestive heart failure), NYHA class  II, acute on chronic, systolic (HCC)    EF 08% 08/5782   Coronary artery disease    [facility]   Diabetes mellitus without complication ([place])    Dysplastic nevus 08/26/2020   L flank, moderate atypia   Hyperlipidemia    Hypertension    Melanoma ([place]) 08/20/2015   Right neck. Superficial spreading, arising in nevus. Tumor thickness 1.52m, Anatomic level IV   Melanoma in situ ([place] 05/22/2020   R lateral neck ant to scar, exc 05/22/20   Numbness in right leg    due to back   Skin  cancer    removed l arm   Spinal stenosis    Stented coronary artery 2000   X 1 STENT   Swelling of both lower extremities      SURGICAL HISTORY   Past Surgical History:  Procedure Laterality Date   CATARACT EXTRACTION W/PHACO Right 05/03/2019   Procedure: CATARACT EXTRACTION PHACO AND INTRAOCULAR LENS PLACEMENT (IOC) RIGHT DIABETIC 6.44 00:58.4 11.0%;  Surgeon: BLeandrew Koyanagi MD;  Location: [facility]  Service: Ophthalmology;  Laterality: Right;  DIABETIC   CATARACT EXTRACTION W/PHACO Left 06/28/2019   Procedure: CATARACT EXTRACTION PHACO AND INTRAOCULAR LENS PLACEMENT (IOC) LEFT DIABETIC 7.99  01:13.4  10.9% ;  Surgeon: BLeandrew Koyanagi MD;  Location: [facility]  Service: Ophthalmology;  Laterality: Left;  Diabetic - insulin and oral meds   CHOLECYSTECTOMY     JOINT REPLACEMENT     RT KNEE   LUMBAR LAMINECTOMY/DECOMPRESSION MICRODISCECTOMY  04/13/2012   Procedure: LUMBAR LAMINECTOMY/DECOMPRESSION MICRODISCECTOMY 1 LEVEL;  Surgeon: JJohnn Hai MD;  Location: [facility];  Service: Orthopedics;  Laterality: Bilateral;  L4-L5   UVULECTOMY  1990's     FAMILY HISTORY   History reviewed. No pertinent family history.   SOCIAL HISTORY   Social History   Tobacco Use   Smoking status: Never   Smokeless tobacco: Never  Vaping Use   Vaping Use: Never used  Substance Use Topics   Alcohol use: Yes    Comment: rare   Drug use: No     MEDICATIONS    Home Medication:     Current Medication:  Current Facility-Administered Medications:    0.9 %  sodium chloride infusion (Manually program via Guardrails IV Fluids), , Intravenous, Once, ZSharen Hones MD   acetaminophen (TYLENOL) tablet 650 mg, 650 mg, Oral, Q6H PRN **OR** acetaminophen (TYLENOL) suppository 650 mg, 650 mg, Rectal, Q6H PRN, MSid Falcon MD   ascorbic acid (VITAMIN C) tablet 500 mg, 500 mg, Oral, Daily, KVal Riles MD, 500 mg at 04/20/22 0913   atorvastatin (LIPITOR) tablet 80  mg, 80 mg, Oral, Daily, MGilles ChiquitoB, MD, 80 mg at 04/20/22 0913   bisacodyl (DULCOLAX) EC tablet 10 mg, 10 mg, Oral, QHS, KVal Riles MD, 10 mg at 04/19/22 2100   bisacodyl (DULCOLAX) suppository 10 mg, 10 mg, Rectal, Daily PRN, KVal Riles MD   Chlorhexidine Gluconate Cloth 2 % PADS 6 each, 6 each, Topical, Daily, KVal Riles MD, 6 each at 04/20/22 1615   enoxaparin (LOVENOX) injection 70 mg, 0.5 mg/kg, Subcutaneous, Q24H, MGilles ChiquitoB, MD, 70 mg at 04/20/22 0912   guaiFENesin (MUCINEX) 12 hr tablet 600 mg, 600 mg, Oral, BID, KVal Riles MD, 600 mg at 04/20/22 0914   guaiFENesin-dextromethorphan (ROBITUSSIN DM) 100-10 MG/5ML syrup 5 mL, 5 mL, Oral, Q4H PRN, MGilles ChiquitoB, MD, 5 mL at 04/13/22 2154   HYDROmorphone (DILAUDID) injection 0.5-1 mg, 0.5-1 mg, Intravenous, Q2H PRN,  Sid Falcon, MD, 1 mg at 04/14/22 2136   insulin aspart (novoLOG) injection 0-15 Units, 0-15 Units, Subcutaneous, TID WC, Gilles Chiquito B, MD, 8 Units at 04/20/22 1306   insulin aspart (novoLOG) injection 0-5 Units, 0-5 Units, Subcutaneous, QHS, Gilles Chiquito B, MD, 2 Units at 04/19/22 2101   insulin glargine-yfgn (SEMGLEE) injection 25 Units, 25 Units, Subcutaneous, QHS, Sharen Hones, MD, 25 Units at 04/19/22 2100   ipratropium-albuterol (DUONEB) 0.5-2.5 (3) MG/3ML nebulizer solution 3 mL, 3 mL, Nebulization, BID, Val Riles, MD   iron polysaccharides (NIFEREX) capsule 150 mg, 150 mg, Oral, Daily, Val Riles, MD, 150 mg at 04/20/22 0914   methocarbamol (ROBAXIN) 500 mg in dextrose 5 % 50 mL IVPB, 500 mg, Intravenous, Q6H PRN, Sid Falcon, MD   ondansetron (ZOFRAN) tablet 4 mg, 4 mg, Oral, Q6H PRN **OR** ondansetron (ZOFRAN) injection 4 mg, 4 mg, Intravenous, Q6H PRN, Gilles Chiquito B, MD, 4 mg at 04/17/22 0920   oxyCODONE (Oxy IR/ROXICODONE) immediate release tablet 5 mg, 5 mg, Oral, Q4H PRN, Gilles Chiquito B, MD, 5 mg at 04/20/22 1625   polyethylene glycol (MIRALAX / GLYCOLAX) packet 17 g, 17  g, Oral, BID, Val Riles, MD, 17 g at 04/20/22 0915   sodium chloride flush (NS) 0.9 % injection 3 mL, 3 mL, Intravenous, Q12H, Gilles Chiquito B, MD, 3 mL at 04/20/22 0915   spironolactone (ALDACTONE) tablet 50 mg, 50 mg, Oral, Daily, Breeze, Shantelle, NP, 50 mg at 04/20/22 0913   tacrolimus (PROGRAF) capsule 1 mg, 1 mg, Oral, BID, Murlean Iba, MD, 1 mg at 04/20/22 8338   tamsulosin (FLOMAX) capsule 0.4 mg, 0.4 mg, Oral, QPC supper, Val Riles, MD, 0.4 mg at 04/19/22 1711   torsemide (DEMADEX) tablet 100 mg, 100 mg, Oral, Daily, Breeze, Shantelle, NP, 100 mg at 04/20/22 2505   Vitamin D (Ergocalciferol) (DRISDOL) 1.25 MG (50000 UNIT) capsule 50,000 Units, 50,000 Units, Oral, Q7 days, Val Riles, MD, 50,000 Units at 04/17/22 1228    ALLERGIES   Carvedilol, Metoprolol, Toprol xl [metoprolol tartrate], Buprenorphine, Buprenorphine hcl, Morphine, Morphine and related, Triamcinolone, and Triamcinolone acetonide     REVIEW OF SYSTEMS    Review of Systems:  Gen:  Denies  fever, sweats, chills weigh loss  HEENT: Denies blurred vision, double vision, ear pain, eye pain, hearing loss, nose bleeds, sore throat Cardiac:  No dizziness, chest pain or heaviness, chest tightness,edema Resp:   reports dyspnea chronically  Gi: Denies swallowing difficulty, stomach pain, nausea or vomiting, diarrhea, constipation, bowel incontinence Gu:  Denies bladder incontinence, burning urine Ext:   Denies Joint pain, stiffness or swelling Skin: Denies  skin rash, easy bruising or bleeding or hives Endoc:  Denies polyuria, polydipsia , polyphagia or weight change Psych:   Denies depression, insomnia or hallucinations   Other:  All other systems negative   VS: BP 135/65 (BP Location: Right Arm)   Pulse 91   Temp 97.8 F (36.6 C) (Oral)   Resp 16   Ht 6' (1.829 m)   Wt 124.2 kg   SpO2 100%   BMI 37.14 kg/m      PHYSICAL EXAM    GENERAL:NAD, no fevers, chills, no weakness no  fatigue HEAD: Normocephalic, atraumatic.  EYES: Pupils equal, round, reactive to light. Extraocular muscles intact. No scleral icterus.  MOUTH: Moist mucosal membrane. Dentition intact. No abscess noted.  EAR, NOSE, THROAT: Clear without exudates. No external lesions.  NECK: Supple. No thyromegaly. No nodules. No JVD.  PULMONARY: decreased breath sounds with  mild rhonchi worse at bases bilaterally.  CARDIOVASCULAR: S1 and S2. Regular rate and rhythm. No murmurs, rubs, or gallops. No edema. Pedal pulses 2+ bilaterally.  GASTROINTESTINAL: Soft, nontender, nondistended. No masses. Positive bowel sounds. No hepatosplenomegaly.  MUSCULOSKELETAL: No swelling, clubbing, or edema. Range of motion full in all extremities.  NEUROLOGIC: Cranial nerves II through XII are intact. No gross focal neurological deficits. Sensation intact. Reflexes intact.  SKIN: No ulceration, lesions, rashes, or cyanosis. Skin warm and dry. Turgor intact.  PSYCHIATRIC: Mood, affect within normal limits. The patient is awake, alert and oriented x 3. Insight, judgment intact.       IMAGING     ASSESSMENT/PLAN   Acute on chronic hypoxemic respiratory failure Present on admission due to influenza A infection with recurrent left pleural effusion and associated compressive atelectasis -Patient's evaluated by nephrology and is status post Lasix drip as well as pigtail chest tube on the left with 400cc serosanguineous fluid overnight and active drain during examination today -Is no air leak upon forced aspiratory maneuver, chest tube is not clogged and fluid is freely flowing. -Fluid was previously tested and was noted to be lymphocyte predominant fluid and became exudative upon initiation of diuretics which is known to happen.  Does not appear to be empyema and I do not think he is a very good candidate for intrapleural thrombolytics.  Patient may be heading towards dialysis but if not he may also benefit from thoracic surgery  evaluation and possible pleurodesis.     CKD - per nephro    Rutixan plan at some point.    Thank you for allowing me to participate in the care of this patient.   Patient/Family are satisfied with care plan and all questions have been answered.    Provider disclosure: Patient with at least one acute or chronic illness or injury that poses a threat to life or bodily function and is being managed actively during this encounter.  All of the below services have been performed independently by signing provider:  review of prior documentation from internal and or external health records.  Review of previous and current lab results.  Interview and comprehensive assessment during patient visit today. Review of current and previous chest radiographs/CT scans. Discussion of management and test interpretation with health care team and patient/family.   This document was prepared using Dragon voice recognition software and may include unintentional dictation errors.     Ottie Glazier, M.D.  Division of Pulmonary & Critical Care Medicine

## 2022-04-20 NOTE — Inpatient Diabetes Management (Signed)
Inpatient Diabetes Program Recommendations  AACE/ADA: New Consensus Statement on Inpatient Glycemic Control (2015)  Target Ranges:  Prepandial:   less than 140 mg/dL      [facility] postprandial:   less than 180 mg/dL (1-2 hours)      Critically ill patients:  140 - 180 mg/dL    Latest Reference Range & Units 04/19/22 07:41 04/19/22 12:08 04/19/22 16:00 04/19/22 20:56  Glucose-Capillary 70 - 99 mg/dL 209 (H)  5 units Novolog  234 (H)  5 units Novolog  230 (H)  5 units Novolog  245 (H)  2 units Novolog  25 units Semglee  (H): Data is abnormally high  Latest Reference Range & Units 04/20/22 08:03 04/20/22 11:53  Glucose-Capillary 70 - 99 mg/dL 184 (H)  3 units Novolog  269 (H)  8 units Novolog   (H): Data is abnormally high    Home DM Meds: Dexcom G6 monitor Glucotrol XL 5 mg daily Novolog 50 units with Breakfast and 60 units with lunch Lantus 34 units daily Jardiance 10 mg daily Metformin 1000 mg bid     Current Orders: Semglee 25 units QHS  Novolog 0-15 units TID ac/hs       MD- Please consider:  1. Increase Semglee slightly to 28 units QHS  2. Start Novolog Meal Coverage: Novolog 6 units TID with meals HOLD if pt NPO HOLD if pt eats <50% meals    --Will follow patient during hospitalization--  Wyn Quaker RN, MSN, [place] Diabetes Coordinator Inpatient Glycemic Control Team Team Pager: [phone] (8a-5p)

## 2022-04-20 NOTE — TOC Benefit Eligibility Note (Signed)
Patient Teacher, English as a foreign language completed.    The patient is currently admitted and upon discharge could be taking Jardiance 10 mg.  The current 30 day co-pay is $45.00.   The patient is currently admitted and upon discharge could be taking Farxiga 10 mg.  The current 30 day co-pay is $95.00.   The patient is insured through [place], [place] Patient Advocate Specialist [place] Patient Advocate Team Direct Number: [phone]  Fax: [phone]

## 2022-04-20 NOTE — NC FL2 (Signed)
[place] LEVEL OF CARE FORM     IDENTIFICATION  Patient Name: [name] Birthdate: 08/03/1950 Sex: male Admission Date (Current Location): 04/11/2022  [facility] and [place] Number:  Engineering geologist and Address:  [facility], [address] [place], [place] [address]      Provider Number: [id number]  Attending Physician Name and Address:  Val Riles, MD  Relative Name and Phone Number:  Allah Reason, [phone]    Current Level of Care: Hospital Recommended Level of Care: [place] Prior Approval Number:    Date Approved/Denied:   PASRR Number: [id number]  Discharge Plan: SNF    Current Diagnoses: Patient Active Problem List   Diagnosis Date Noted   Chronic membranous glomerulonephritis 04/14/2022   Influenza A 04/12/2022   Acute renal failure superimposed on stage 2 chronic kidney disease ([place]) 04/12/2022   Recurrent left pleural effusion 04/12/2022   Acute hypoxemic respiratory failure ([place]) 04/12/2022   Aortic atherosclerosis ([place]) 03/02/2022   Acute on chronic systolic CHF (congestive heart failure) ([place]) 03/02/2022   Lymphedema 03/02/2022   Pain and swelling of lower leg 03/02/2022   Venous ulcer of ankle ([place]) 03/01/2022   Abnormal urine 01/13/2022   Chronic kidney disease due to type 2 diabetes mellitus ([place]) 01/13/2022   Chronic kidney disease, stage II (mild) 01/13/2022   Edema of lower extremity 01/13/2022   Other microscopic hematuria 01/13/2022   Proteinuria, unspecified 01/13/2022   Bilateral leg edema 07/01/2020   Iron deficiency anemia 05/23/2019   Anemia, unspecified 05/21/2019   Bilateral carotid artery stenosis 07/12/2018   Osteoarthritis of right hip 05/09/2018   Pain in joint of right hip 04/12/2018   Diabetes mellitus type 2 in obese ([place]) 11/18/2017   Essential (primary) hypertension 11/18/2017   Obesity, Class III, BMI 40-49.9 (morbid obesity) ([place]) 09/10/2016   SOBOE  (shortness of breath on exertion) 09/10/2016   Melanoma of neck ([place]) 10/01/2015   Type 2 diabetes mellitus with hyperglycemia ([place]) 10/30/2014   CAD (coronary artery disease) 71/24/5809   Chronic systolic CHF (congestive heart failure), NYHA class 2 ([place]) 04/16/2014   Back pain 08/30/2013   Hyperlipidemia, mixed 08/30/2013   Lumbar spinal stenosis 04/13/2012   Diabetic neuropathy ([place]) 05/28/2011   Renal insufficiency 05/28/2011    Orientation RESPIRATION BLADDER Height & Weight     Self, Time, Situation, Place  O2 (O22L via nasal cannula) External catheter Weight: 124.2 kg Height:  6' (182.9 cm)  BEHAVIORAL SYMPTOMS/MOOD NEUROLOGICAL BOWEL NUTRITION STATUS  Other (Comment) (n/a)  (n/a) Continent Diet (Carb modified)  AMBULATORY STATUS COMMUNICATION OF NEEDS Skin   Limited Assist Verbally  (Eccyhmosis, left hand)                       Personal Care Assistance Level of Assistance  Bathing, Dressing Bathing Assistance: Limited assistance   Dressing Assistance: Limited assistance     Functional Limitations Info             SPECIAL CARE FACTORS FREQUENCY  PT (By licensed PT), OT (By licensed OT)     PT Frequency: Min 2x weekly OT Frequency: Min 2x weekly            Contractures Contractures Info: Not present    Additional Factors Info  Code Status, Allergies Code Status Info: Full Allergies Info: Carvedilol, Metoprolol, Toprol Xl (Metoprolol Tartrate), Buprenorphine, Buprenorphine Hcl, Morphine, Morphine And Related, Triamcinolone, Triamcinolone Acetonide  Current Medications (04/20/2022):  This is the current hospital active medication list Current Facility-Administered Medications  Medication Dose Route Frequency Provider Last Rate Last Admin   0.9 %  sodium chloride infusion (Manually program via Guardrails IV Fluids)   Intravenous Once Sharen Hones, MD       acetaminophen (TYLENOL) tablet 650 mg  650 mg Oral Q6H PRN Sid Falcon, MD        Or   acetaminophen (TYLENOL) suppository 650 mg  650 mg Rectal Q6H PRN Sid Falcon, MD       ascorbic acid (VITAMIN C) tablet 500 mg  500 mg Oral Daily Val Riles, MD   500 mg at 04/20/22 0913   atorvastatin (LIPITOR) tablet 80 mg  80 mg Oral Daily Sid Falcon, MD   80 mg at 04/20/22 0913   bisacodyl (DULCOLAX) EC tablet 10 mg  10 mg Oral QHS Val Riles, MD   10 mg at 04/19/22 2100   bisacodyl (DULCOLAX) suppository 10 mg  10 mg Rectal Daily PRN Val Riles, MD       Chlorhexidine Gluconate Cloth 2 % PADS 6 each  6 each Topical Daily Val Riles, MD   6 each at 04/19/22 0832   enoxaparin (LOVENOX) injection 70 mg  0.5 mg/kg Subcutaneous Q24H Gilles Chiquito B, MD   70 mg at 04/20/22 0912   guaiFENesin (MUCINEX) 12 hr tablet 600 mg  600 mg Oral BID Val Riles, MD   600 mg at 04/20/22 0914   guaiFENesin-dextromethorphan (ROBITUSSIN DM) 100-10 MG/5ML syrup 5 mL  5 mL Oral Q4H PRN Gilles Chiquito B, MD   5 mL at 04/13/22 2154   HYDROmorphone (DILAUDID) injection 0.5-1 mg  0.5-1 mg Intravenous Q2H PRN Gilles Chiquito B, MD   1 mg at 04/14/22 2136   insulin aspart (novoLOG) injection 0-15 Units  0-15 Units Subcutaneous TID WC Gilles Chiquito B, MD   8 Units at 04/20/22 1306   insulin aspart (novoLOG) injection 0-5 Units  0-5 Units Subcutaneous QHS Gilles Chiquito B, MD   2 Units at 04/19/22 2101   insulin glargine-yfgn (SEMGLEE) injection 25 Units  25 Units Subcutaneous QHS Sharen Hones, MD   25 Units at 04/19/22 2100   ipratropium-albuterol (DUONEB) 0.5-2.5 (3) MG/3ML nebulizer solution 3 mL  3 mL Nebulization BID Val Riles, MD       iron polysaccharides (NIFEREX) capsule 150 mg  150 mg Oral Daily Val Riles, MD   150 mg at 04/20/22 0914   methocarbamol (ROBAXIN) 500 mg in dextrose 5 % 50 mL IVPB  500 mg Intravenous Q6H PRN Gilles Chiquito B, MD       ondansetron (ZOFRAN) tablet 4 mg  4 mg Oral Q6H PRN Gilles Chiquito B, MD       Or   ondansetron [facility]) injection 4 mg  4 mg Intravenous  Q6H PRN Gilles Chiquito B, MD   4 mg at 04/17/22 0920   oxyCODONE (Oxy IR/ROXICODONE) immediate release tablet 5 mg  5 mg Oral Q4H PRN Gilles Chiquito B, MD   5 mg at 04/18/22 2126   polyethylene glycol (MIRALAX / GLYCOLAX) packet 17 g  17 g Oral BID Val Riles, MD   17 g at 04/20/22 0915   sodium chloride flush (NS) 0.9 % injection 3 mL  3 mL Intravenous Q12H Gilles Chiquito B, MD   3 mL at 04/20/22 0915   spironolactone (ALDACTONE) tablet 50 mg  50 mg Oral Daily Colon Flattery, NP   50  mg at 04/20/22 0913   tacrolimus (PROGRAF) capsule 1 mg  1 mg Oral BID Murlean Iba, MD   1 mg at 04/20/22 0914   tamsulosin (FLOMAX) capsule 0.4 mg  0.4 mg Oral QPC supper Val Riles, MD   0.4 mg at 04/19/22 1711   torsemide (DEMADEX) tablet 100 mg  100 mg Oral Daily Colon Flattery, NP   100 mg at 04/20/22 0913   Vitamin D (Ergocalciferol) (DRISDOL) 1.25 MG (50000 UNIT) capsule 50,000 Units  50,000 Units Oral Q7 days Val Riles, MD   50,000 Units at 04/17/22 1228     Discharge Medications: Please see discharge summary for a list of discharge medications.  Relevant Imaging Results:  Relevant Lab Results:   Additional Information SS# [id number]  Laurena Slimmer, RN

## 2022-04-21 ENCOUNTER — Encounter: Payer: Self-pay | Admitting: Oncology

## 2022-04-21 DIAGNOSIS — J101 Influenza due to other identified influenza virus with other respiratory manifestations: Secondary | ICD-10-CM | POA: Diagnosis not present

## 2022-04-21 LAB — BASIC METABOLIC PANEL
Anion gap: 8 (ref 5–15)
BUN: 46 mg/dL — ABNORMAL HIGH (ref 8–23)
CO2: 33 mmol/L — ABNORMAL HIGH (ref 22–32)
Calcium: 8.2 mg/dL — ABNORMAL LOW (ref 8.9–10.3)
Chloride: 98 mmol/L (ref 98–111)
Creatinine, Ser: 1.33 mg/dL — ABNORMAL HIGH (ref 0.61–1.24)
GFR, Estimated: 56 mL/min — ABNORMAL LOW (ref >60)
Glucose, Bld: 202 mg/dL — ABNORMAL HIGH (ref 70–99)
Potassium: 4.4 mmol/L (ref 3.5–5.1)
Sodium: 139 mmol/L (ref 135–145)

## 2022-04-21 LAB — CBC
HCT: 26.9 % — ABNORMAL LOW (ref 39.0–52.0)
Hemoglobin: 8.4 g/dL — ABNORMAL LOW (ref 13.0–17.0)
MCH: 27.6 pg (ref 26.0–34.0)
MCHC: 31.2 g/dL (ref 30.0–36.0)
MCV: 88.5 fL (ref 80.0–100.0)
Platelets: 269 10*3/uL (ref 150–400)
RBC: 3.04 MIL/uL — ABNORMAL LOW (ref 4.22–5.81)
RDW: 15.6 % — ABNORMAL HIGH (ref 11.5–15.5)
WBC: 6.2 10*3/uL (ref 4.0–10.5)
nRBC: 0 % (ref 0.0–0.2)

## 2022-04-21 LAB — GLUCOSE, CAPILLARY
Glucose-Capillary: 192 mg/dL — ABNORMAL HIGH (ref 70–99)
Glucose-Capillary: 251 mg/dL — ABNORMAL HIGH (ref 70–99)
Glucose-Capillary: 281 mg/dL — ABNORMAL HIGH (ref 70–99)

## 2022-04-21 MED ORDER — INSULIN ASPART 100 UNIT/ML IJ SOLN
0.0000 [IU] | Freq: Every day | INTRAMUSCULAR | Status: DC
  Administered 2022-04-21 – 2022-04-22 (×2): 3 [IU] via SUBCUTANEOUS
  Administered 2022-04-23: 2 [IU] via SUBCUTANEOUS
  Administered 2022-04-24 – 2022-04-25 (×2): 3 [IU] via SUBCUTANEOUS
  Filled 2022-04-21 (×5): qty 1

## 2022-04-21 MED ORDER — HYDROCERIN EX CREA
TOPICAL_CREAM | Freq: Two times a day (BID) | CUTANEOUS | Status: DC
  Administered 2022-04-21: 1 via TOPICAL
  Filled 2022-04-21: qty 113

## 2022-04-21 MED ORDER — INSULIN ASPART 100 UNIT/ML IJ SOLN: 0.0000 [IU] | Freq: Every day | INTRAMUSCULAR | Status: DC

## 2022-04-21 MED ORDER — INSULIN ASPART 100 UNIT/ML IJ SOLN
6.0000 [IU] | Freq: Three times a day (TID) | INTRAMUSCULAR | Status: DC
  Administered 2022-04-21 – 2022-04-23 (×5): 6 [IU] via SUBCUTANEOUS
  Filled 2022-04-21 (×5): qty 1

## 2022-04-21 MED ORDER — ENOXAPARIN SODIUM 60 MG/0.6ML IJ SOSY
0.5000 mg/kg | PREFILLED_SYRINGE | INTRAMUSCULAR | Status: DC
  Administered 2022-04-21 – 2022-04-25 (×5): 62.5 mg via SUBCUTANEOUS
  Filled 2022-04-21 (×5): qty 1.2

## 2022-04-21 MED ORDER — INSULIN GLARGINE-YFGN 100 UNIT/ML ~~LOC~~ SOLN
28.0000 [IU] | Freq: Every day | SUBCUTANEOUS | Status: DC
  Administered 2022-04-21 – 2022-04-22 (×2): 28 [IU] via SUBCUTANEOUS
  Filled 2022-04-21 (×2): qty 0.28

## 2022-04-21 MED ORDER — OXYCODONE HCL 5 MG PO TABS: 5.0000 mg | ORAL_TABLET | Freq: Four times a day (QID) | ORAL | Status: DC | PRN

## 2022-04-21 MED ORDER — INSULIN ASPART 100 UNIT/ML IJ SOLN
0.0000 [IU] | Freq: Three times a day (TID) | INTRAMUSCULAR | Status: DC
  Administered 2022-04-21: 8 [IU] via SUBCUTANEOUS
  Administered 2022-04-21: 3 [IU] via SUBCUTANEOUS
  Administered 2022-04-22 (×2): 5 [IU] via SUBCUTANEOUS
  Administered 2022-04-22: 8 [IU] via SUBCUTANEOUS
  Administered 2022-04-23 (×2): 5 [IU] via SUBCUTANEOUS
  Administered 2022-04-23: 3 [IU] via SUBCUTANEOUS
  Administered 2022-04-24: 5 [IU] via SUBCUTANEOUS
  Administered 2022-04-24 (×2): 3 [IU] via SUBCUTANEOUS
  Administered 2022-04-25: 5 [IU] via SUBCUTANEOUS
  Administered 2022-04-25 – 2022-04-26 (×4): 8 [IU] via SUBCUTANEOUS
  Administered 2022-04-26: 3 [IU] via SUBCUTANEOUS
  Filled 2022-04-21 (×14): qty 1

## 2022-04-21 NOTE — Care Management Important Message (Signed)
Important Message  Patient Details  Name: [name] MRN: [id number] Date of Birth: Apr 29, 1950   Medicare Important Message Given:  Other (see comment)  Attempted to reach patient via room phone [phone]) to review Medicare IM.  Unable to reach upon attempt.    Dannette Barbara 04/21/2022, 1:42 PM

## 2022-04-21 NOTE — TOC Progression Note (Addendum)
Transition of Care [facility]) - Progression Note    Patient Details  Name: JAMI OHLIN MRN: [id number] Date of Birth: 1950-03-29  Transition of [facility]) CM/SW Contact  Laurena Slimmer, RN Phone Number: 04/21/2022, 1:41 PM  Clinical Narrative:    Attempt to reach patient's son to give bed offer for [facility] and [facility]. Attempted [place] at [phone] and in patient's room. No answer in room or cell phone. Unable to leave a message.   2:00pm Retrieved call from [place]. Bed offers given for [facility]. [name] advised [facility] was considering. Patient is seen by Dr. Lovie Macadamia.   2:10pm Spoke with Tammy from [facility]. She will need patient's MCR  before she can offer a bed. Spoke with [name]. He has a copy of card and will leave it at the the nurse's station for this RNCM.        Expected Discharge Plan and Services                                               Social Determinants of Health (SDOH) Interventions SDOH Screenings   Food Insecurity: No Food Insecurity (04/13/2022)  Housing: Low Risk  (04/13/2022)  Transportation Needs: No Transportation Needs (04/13/2022)  Utilities: Not At Risk (04/13/2022)  Depression (PHQ2-9): Low Risk  (02/27/2022)  Recent Concern: Depression (PHQ2-9) - Medium Risk (02/02/2022)  Tobacco Use: Low Risk  (04/13/2022)    Readmission Risk Interventions     No data to display

## 2022-04-21 NOTE — Progress Notes (Signed)
[facility]  ROUNDING NOTE   Subjective:  Patient well-known to [place] as we follow him for idiopathic membranous glomerulonephritis with nephrotic syndrome, chronic kidney disease stage II with diabetes mellitus type 2, proteinuria, and lower extremity edema.  Patient came in with significant shortness of breath.  Found to be positive for the influenza A  Has a left-sided chest tube in place.  Started on tacrolimus  Objective:  Vital signs in last 24 hours:  Temp:  [97.8 F (36.6 C)-98.6 F (37 C)] 98.6 F (37 C) (02/06 1234) Pulse Rate:  [84-95] 85 (02/06 1234) Resp:  [16-20] 19 (02/06 1234) BP: (126-145)/(59-72) 126/72 (02/06 1234) SpO2:  [99 %-100 %] 99 % (02/06 1234) Weight:  [124.8 kg] 124.8 kg (02/06 0414)  Weight change: 0.6 kg Filed Weights   04/19/22 0400 04/20/22 0500 04/21/22 0414  Weight: 124.6 kg 124.2 kg 124.8 kg    Intake/Output: I/O last 3 completed shifts: In: 733 [P.O.:720; I.V.:3] Out: 5809 [Urine:3250; Chest Tube:125]   Intake/Output this shift:  Total I/O In: 240 [P.O.:240] Out: 650 [Urine:650]  Physical Exam: General: NAD  Head: Normocephalic, atraumatic. Moist oral mucosal membranes  Lungs:  Clear, +left chest tube  Heart: regular  Abdomen:  Soft, nontender, bowel sounds present  Extremities: 1+ brawny peripheral edema.  Neurologic: Awake, alert, following commands  Skin: No acute rash    Basic Metabolic Panel: Recent Labs  Lab 04/15/22 0235 04/16/22 0445 04/17/22 0443 04/18/22 0455 04/19/22 0532 04/20/22 0520 04/21/22 0554  NA 135 137 137 138 141 140 139  K 3.8 3.5 4.0 3.6 3.6 3.7 4.4  CL 99 101 102 103 102 99 98  CO2 '29 26 29 27 31 '$ 32 33*  GLUCOSE 148* 114* 176* 134* 233* 215* 202*  BUN 83* 83* 77* 74* 64* 53* 46*  CREATININE 2.26* 2.08* 2.03* 1.84* 1.68* 1.45* 1.33*  CALCIUM 7.6* 7.7* 7.8* 7.7* 8.0* 8.0* 8.2*  MG 2.4 2.5* 2.3 2.2  --   --   --   PHOS  --  4.6 3.6 2.9  --   --   --      Liver Function  Tests: Recent Labs  Lab 04/15/22 0235  AST 20  ALT 11  ALKPHOS 60  BILITOT 1.2  PROT 5.6*  ALBUMIN 2.9*    No results for input(s): "LIPASE", "AMYLASE" in the last 168 hours. No results for input(s): "AMMONIA" in the last 168 hours.  CBC: Recent Labs  Lab 04/17/22 0443 04/18/22 0455 04/19/22 0532 04/20/22 0520 04/21/22 0554  WBC 6.0 5.0 5.2 5.9 6.2  HGB 7.2* 7.2* 7.5* 7.8* 8.4*  HCT 22.4* 22.5* 23.9* 24.4* 26.9*  MCV 89.6 88.9 89.2 88.4 88.5  PLT 182 192 212 234 269     Cardiac Enzymes: No results for input(s): "CKTOTAL", "CKMB", "CKMBINDEX", "TROPONINI" in the last 168 hours.  BNP: Invalid input(s): "POCBNP"  CBG: Recent Labs  Lab 04/20/22 0803 04/20/22 1153 04/20/22 1641 04/20/22 2110 04/21/22 0723  GLUCAP 184* 269* 278* 322* 192*     Microbiology: Results for orders placed or performed during the hospital encounter of 04/11/22  Resp panel by RT-PCR (RSV, Flu A&B, Covid) Anterior Nasal Swab     Status: Abnormal   Collection Time: 04/11/22  8:34 PM   Specimen: Anterior Nasal Swab  Result Value Ref Range Status   SARS Coronavirus 2 by RT PCR NEGATIVE NEGATIVE Final    Comment: (NOTE) SARS-CoV-2 target nucleic acids are NOT DETECTED.  The SARS-CoV-2 RNA is generally detectable  in upper respiratory specimens during the acute phase of infection. The lowest concentration of SARS-CoV-2 viral copies this assay can detect is 138 copies/mL. A negative result does not preclude SARS-Cov-2 infection and should not be used as the sole basis for treatment or other patient management decisions. A negative result may occur with  improper specimen collection/handling, submission of specimen other than nasopharyngeal swab, presence of viral mutation(s) within the areas targeted by this assay, and inadequate number of viral copies(<138 copies/mL). A negative result must be combined with clinical observations, patient history, and epidemiological information. The  expected result is Negative.  Fact Sheet for Patients:  EntrepreneurPulse.com.au  Fact Sheet for Healthcare Providers:  IncredibleEmployment.be  This test is no t yet approved or cleared by the [place] FDA and  has been authorized for detection and/or diagnosis of SARS-CoV-2 by FDA under an Emergency Use Authorization (EUA). This EUA will remain  in effect (meaning this test can be used) for the duration of the COVID-19 declaration under Section 564(b)(1) of the Act, 21 U.S.C.section 360bbb-3(b)(1), unless the authorization is terminated  or revoked sooner.       Influenza A by PCR POSITIVE (A) NEGATIVE Final   Influenza B by PCR NEGATIVE NEGATIVE Final    Comment: (NOTE) The Xpert Xpress SARS-CoV-2/FLU/RSV plus assay is intended as an aid in the diagnosis of influenza from Nasopharyngeal swab specimens and should not be used as a sole basis for treatment. Nasal washings and aspirates are unacceptable for Xpert Xpress SARS-CoV-2/FLU/RSV testing.  Fact Sheet for Patients: EntrepreneurPulse.com.au  Fact Sheet for Healthcare Providers: IncredibleEmployment.be  This test is not yet approved or cleared by the [place] FDA and has been authorized for detection and/or diagnosis of SARS-CoV-2 by FDA under an Emergency Use Authorization (EUA). This EUA will remain in effect (meaning this test can be used) for the duration of the COVID-19 declaration under Section 564(b)(1) of the Act, 21 U.S.C. section 360bbb-3(b)(1), unless the authorization is terminated or revoked.     Resp Syncytial Virus by PCR NEGATIVE NEGATIVE Final    Comment: (NOTE) Fact Sheet for Patients: EntrepreneurPulse.com.au  Fact Sheet for Healthcare Providers: IncredibleEmployment.be  This test is not yet approved or cleared by the [place] FDA and has been authorized for detection and/or  diagnosis of SARS-CoV-2 by FDA under an Emergency Use Authorization (EUA). This EUA will remain in effect (meaning this test can be used) for the duration of the COVID-19 declaration under Section 564(b)(1) of the Act, 21 U.S.C. section 360bbb-3(b)(1), unless the authorization is terminated or revoked.  Performed at [facility], [address] [place], [place] [address]   Culture, blood (routine x 2)     Status: None   Collection Time: 04/11/22  9:59 PM   Specimen: BLOOD LEFT ARM  Result Value Ref Range Status   Specimen Description BLOOD LEFT ARM  Final   Special Requests   Final    BOTTLES DRAWN AEROBIC AND ANAEROBIC Blood Culture adequate volume   Culture   Final    NO GROWTH 5 DAYS Performed at [facility], [address] [place], [place] [address]    Report Status 04/16/2022 FINAL  Final  Culture, blood (routine x 2)     Status: None   Collection Time: 04/11/22 10:01 PM   Specimen: BLOOD LEFT HAND  Result Value Ref Range Status   Specimen Description BLOOD LEFT HAND  Final   Special Requests   Final    BOTTLES DRAWN AEROBIC  AND ANAEROBIC Blood Culture results may not be optimal due to an inadequate volume of blood received in culture bottles   Culture   Final    NO GROWTH 5 DAYS Performed at [facility], [address] [place], [place] [address]    Report Status 04/16/2022 FINAL  Final    Coagulation Studies: No results for input(s): "LABPROT", "INR" in the last 72 hours.  Urinalysis: No results for input(s): "COLORURINE", "LABSPEC", "PHURINE", "GLUCOSEU", "HGBUR", "BILIRUBINUR", "KETONESUR", "PROTEINUR", "UROBILINOGEN", "NITRITE", "LEUKOCYTESUR" in the last 72 hours.  Invalid input(s): "APPERANCEUR"    Imaging: No results found.   Medications:    methocarbamol (ROBAXIN) IV      sodium chloride   Intravenous Once   atorvastatin  80 mg Oral Daily   bisacodyl  10 mg Oral QHS   Chlorhexidine Gluconate Cloth  6 each Topical  Daily   enoxaparin (LOVENOX) injection  0.5 mg/kg Subcutaneous Q24H   guaiFENesin  600 mg Oral BID   hydrocerin   Topical BID   insulin aspart  0-15 Units Subcutaneous TID WC   insulin aspart  0-5 Units Subcutaneous QHS   insulin aspart  6 Units Subcutaneous TID WC   insulin glargine-yfgn  28 Units Subcutaneous QHS   ipratropium-albuterol  3 mL Nebulization BID   iron polysaccharides  150 mg Oral Daily   polyethylene glycol  17 g Oral BID   sodium chloride flush  3 mL Intravenous Q12H   spironolactone  50 mg Oral Daily   tacrolimus  1 mg Oral BID   tamsulosin  0.4 mg Oral QPC supper   torsemide  100 mg Oral Daily   Vitamin D (Ergocalciferol)  50,000 Units Oral Q7 days   acetaminophen **OR** acetaminophen, bisacodyl, guaiFENesin-dextromethorphan, HYDROmorphone (DILAUDID) injection, methocarbamol (ROBAXIN) IV, ondansetron **OR** ondansetron (ZOFRAN) IV, oxyCODONE  Assessment/ Plan:  72 y.o. male with past medical history of diabetes mellitus type 2, morbid obesity, hypertension, coronary artery disease status post stent placement, cardiomyopathy, low testosterone, hyperlipidemia, history of first left toe osteomyelitis with amputation, hydronephrosis, osteoarthritis of the right hip, history of idiopathic membranous glomerulonephritis who presents with increasing shortness of breath.  1.  Idiopathic membranous glomerulonephritis/ Acute kidney injury on chronic kidney disease stage II/proteinuria most recent urine protein to creatinine ratio 4.9.  Patient was in the process of being evaluated for rituximab treatment of his idiopathic membranous glomerulonephritis. -Hold off on administering rituximab at this time.  - Continue Tacrolimus '1mg'$  bid. Tacrolimus level scheduled for Wednesday.   2. Lower extremity edema. Edema greatly improved. Continue oral diuresis with spironolactone and torsemide.    3.  Anemia of chronic kidney disease.  Hemoglobin 8.4.  4.  Acute on chronic combined  systolic and diastolic CHF, acute hypoxemic respiratory failure, volume overload, coronary artery disease. Volume management with oral diuresis. Recurrent left-sided pleural effusion secondary to nephrotic syndrome, third spacing of fluid.  Left chest tube in place.  Will follow-up with Dr. Saralyn Pilar at discharge.  5.  Influenza A Completed Tamiflu. Continue supportive care.    LOS: 9 [name] 2/6/20241:43 PM

## 2022-04-21 NOTE — Progress Notes (Signed)
[facility] Progress Note  Patient: [name]    QPY:[id number]  DOA: 04/11/2022     Date of Service: the patient was seen and examined on 04/21/2022  Chief Complaint  Patient presents with   Shortness of Breath   Brief hospital course: [name] is a 72 y.o. male with PMH of arthritis, CHF (chronic), CAD, DM2, HLD, HTN, h/o treated melanoma, spinal stenosis who presents for worsening cough, fatigue and sob. Had low-grade fever, he was found to have positive for influenza A. He had recurrent left-sided pleural effusion, had a thoracentesis x 2 during the last months.  Upon arrival emergency room, chest x-ray showed large left-sided pleural effusion again, chest tube was placed by ED physician. Patient also developed significant hypoxemia with oxygen saturation as low as 76% on room air this morning.  Treated with oxygen. Patient has severe volume overload with evidence of bichamber heart failure.  Echocardiogram showed EF 45-50% with grade 2 diastolic dysfunction. Pulm valve not visualized.  Furosemide drip started after seen by nephrology.   1/29.  Patient had minimal urine after Lasix, but chest tube had drained additional 1500 mL in 24 hours.    Assessment and Plan: Acute on chronic combined systolic and diastolic congestive heart failure Acute hypoxemic respiratory failure secondary to volume overload. Elevated troponin with NSTEMI. Coronary artery disease. Continue supplemental O2 nation and gradually wean off Biventricular heart failure, significant volume overload, edema of bilateral lower extremities, elevated BNP Abdominal ultrasound did not show any ascites. Nephro and cardiology consulted S/p Lasix IV infusion, discontinued on 2/2, started torsemide 100 mg p.o. daily and Aldactone 50 mg p.o. daily Patient was on valsartan 150 mg p.o. daily at home, cardiology recommended to restart as BP allows.  Patient has intolerance to metoprolol and Coreg (itching).  Cardio  signed off. Good urine output after Foley catheter insertion   Recurrent left side pleural effusion.  Hemothorax ruled out. Chest tube was placed in the emergency room, removed 2 L of serous fluids.  No evidence of bleeding.  Based on prior lab test, cytology was negative.  Prior test also does not indicate infection.  Reviewed chest x-ray post chest tube, no evidence of pneumonia. Procalcitonin level not significant elevated at 0.2 with chronic renal function.  No evidence of infection.  Patient is followed by pulmonology, patient appears to have rapid accumulation of pleural fluid.  Had a discussion with CT surgery, pulmonology and nephrology.  Patient still has a large amount of the drain from the chest tube, patient cannot be discharged with chest tube in place.  The source of increased pleural effusion is due to heart failure, third spacing from renal disease.  s/p IV albumin and diuretics.  2/3  CXR  Stable position of left chest tube. No pneumothorax. No change in aeration to the left lower lung.   Influenza A. S/p Tamiflu for 5 days, last dose on 04/16/22 2/1 started Mucinex 600 mg p.o. twice daily due to chest congestion   Acute kidney injury on chronic kidney disease stage II. Idiopathic membranous glomerulonephritis. Hyponatremia. Patient has a worsening renal function on chronic renal disease.  He is grossly volume overloaded, he is started on Lasix drip.  Renal function is gradually getting worse.  There is a possibility that patient may need a dialysis. Nephrology is working with oncology to start rituximab.  2/4 started tacrolimus 1 mg p.o. twice daily until patient is able to take rituximab 2/6 Creatinine 1.33, gradually improving  Iron deficient  anemia. Acute blood loss anemia ruled out. Patient received IV iron, hemoglobin dropped down to 6.9, chest tube fluids does not seem to be bloody.  s/p 1 unit PRBC.  No evidence of GI bleed. Hb 7.2 --7.5--7.8 Continue to monitor H&H  and transfuse if hemoglobin less than 7   Uncontrolled type 2 diabetes with hyperglycemia Continue long-acting insulin, sliding scale insulin.   Essential hypertension Continue to monitor.   Morbid obesity BMI 41.47, Likely obstructive sleep apnea. Diet exercise. Will decide if patient need BiPAP before discharge.   Urinary retention, Foley catheter was inserted on 04/16/2022 Started Flomax Continue Foley catheter for 1 week and then DC when patient is more ambulatory  Vitamin D deficiency: started vitamin D 50,000 units p.o. weekly, follow with PCP to repeat vitamin D level after 3 to 6 months.   Body mass index is 41.47 kg/m.  Interventions:       Diet: Carb modified diet DVT Prophylaxis: Subcutaneous Lovenox   Advance goals of care discussion: Full code  Family Communication: family was not present at bedside, at the time of interview.  The pt provided permission to discuss medical plan with the family. Opportunity was given to ask question and all questions were answered satisfactorily.   Disposition:  Pt is from Home, admitted with anasarca and left pleural effusion, still has left-sided chest tube, which precludes a safe discharge. Discharge to SNF, when clinically stable, needs pulmonary clearance.  Subjective: No significant events overnight, patient was lying comfortably in the bed, feels improvement in the shortness of breath, lower extremity edema.  Denied any chest pain or palpitation, no any other active issues.   Physical Exam: General: NAD, lying comfortably Appear in no distress, affect appropriate Eyes: PERRLA ENT: Oral Mucosa Clear, moist  Neck: no JVD,  Cardiovascular: S1 and S2 Present, no Murmur,  Respiratory: good respiratory effort, Bilateral Air entry equal and Decreased, bilateral crackles, decreased breath sounds in the left lower chest, no wheezing, left chest tube intact Abdomen: Bowel Sound present, Soft and no tenderness,  Skin: no  rashes Extremities: 2-3+ Pedal edema, no calf tenderness.  Edema is gradually improving, very dry skin, Eucerin cream ordered. Neurologic: without any new focal findings Gait not checked due to patient safety concerns  Vitals:   04/21/22 0411 04/21/22 0414 04/21/22 0722 04/21/22 1234  BP: (!) 145/60  (!) 130/59 126/72  Pulse: 86  84 85  Resp: '18  16 19  '$ Temp: 98.1 F (36.7 C)  97.9 F (36.6 C) 98.6 F (37 C)  TempSrc:      SpO2: 100%  99% 99%  Weight:  124.8 kg    Height:        Intake/Output Summary (Last 24 hours) at 04/21/2022 1544 Last data filed at 04/21/2022 1300 Gross per 24 hour  Intake 493 ml  Output 2750 ml  Net -2257 ml   Filed Weights   04/19/22 0400 04/20/22 0500 04/21/22 0414  Weight: 124.6 kg 124.2 kg 124.8 kg    Data Reviewed: I have personally reviewed and interpreted daily labs, tele strips, imagings as discussed above. I reviewed all nursing notes, pharmacy notes, vitals, pertinent old records I have discussed plan of care as described above with RN and patient/family.  CBC: Recent Labs  Lab 04/17/22 0443 04/18/22 0455 04/19/22 0532 04/20/22 0520 04/21/22 0554  WBC 6.0 5.0 5.2 5.9 6.2  HGB 7.2* 7.2* 7.5* 7.8* 8.4*  HCT 22.4* 22.5* 23.9* 24.4* 26.9*  MCV 89.6 88.9 89.2 88.4 88.5  PLT 182 192 212 234 309   Basic Metabolic Panel: Recent Labs  Lab 04/15/22 0235 04/16/22 0445 04/17/22 0443 04/18/22 0455 04/19/22 0532 04/20/22 0520 04/21/22 0554  NA 135 137 137 138 141 140 139  K 3.8 3.5 4.0 3.6 3.6 3.7 4.4  CL 99 101 102 103 102 99 98  CO2 '29 26 29 27 31 '$ 32 33*  GLUCOSE 148* 114* 176* 134* 233* 215* 202*  BUN 83* 83* 77* 74* 64* 53* 46*  CREATININE 2.26* 2.08* 2.03* 1.84* 1.68* 1.45* 1.33*  CALCIUM 7.6* 7.7* 7.8* 7.7* 8.0* 8.0* 8.2*  MG 2.4 2.5* 2.3 2.2  --   --   --   PHOS  --  4.6 3.6 2.9  --   --   --     Studies: No results found.  Scheduled Meds:  sodium chloride   Intravenous Once   atorvastatin  80 mg Oral Daily    bisacodyl  10 mg Oral QHS   Chlorhexidine Gluconate Cloth  6 each Topical Daily   enoxaparin (LOVENOX) injection  0.5 mg/kg Subcutaneous Q24H   guaiFENesin  600 mg Oral BID   hydrocerin   Topical BID   insulin aspart  0-15 Units Subcutaneous TID WC   insulin aspart  0-5 Units Subcutaneous QHS   insulin aspart  6 Units Subcutaneous TID WC   insulin glargine-yfgn  28 Units Subcutaneous QHS   ipratropium-albuterol  3 mL Nebulization BID   iron polysaccharides  150 mg Oral Daily   polyethylene glycol  17 g Oral BID   sodium chloride flush  3 mL Intravenous Q12H   spironolactone  50 mg Oral Daily   tacrolimus  1 mg Oral BID   tamsulosin  0.4 mg Oral QPC supper   torsemide  100 mg Oral Daily   Vitamin D (Ergocalciferol)  50,000 Units Oral Q7 days   Continuous Infusions:  methocarbamol (ROBAXIN) IV     PRN Meds: acetaminophen **OR** acetaminophen, bisacodyl, guaiFENesin-dextromethorphan, HYDROmorphone (DILAUDID) injection, methocarbamol (ROBAXIN) IV, ondansetron **OR** ondansetron (ZOFRAN) IV, oxyCODONE  Time spent: 35 minutes  Author: Val Riles. MD [facility] 04/21/2022 3:44 PM  To reach On-call, see care teams to locate the attending and reach out to them via [email or web address]. If 7PM-7AM, please contact night-coverage If you still have difficulty reaching the attending provider, please page the [facility] (Director on Call) for [facility] on amion for assistance.

## 2022-04-21 NOTE — Progress Notes (Signed)
PULMONOLOGY         Date: 04/21/2022,   MRN# [id number] [name] [birth year]     AdmissionWeight: (!) 138.7 kg                 CurrentWeight: 124.8 kg  Referring provider: Dr Roosevelt Locks   CHIEF COMPLAINT:   Parapneumonic effusion s/p chest tube placement    HISTORY OF PRESENT ILLNESS   This is a pleasant 72 year old male with severe morbid obesity chronic 3+ weeping lower extremity edema due to CKD with Membranous glomerulonephritis disease and recent evaluation with nephrology for rituximab infusion, history of CHF with reduced EF at 40%, osteoarthritis, diabetes, history of melanoma, neuropathy, spinal stenosis with chief complaint of shortness of breath and chest discomfort.  Patient is well-known to our pulmonology service with [facility] clinic we recently saw him for acute on chronic hypoxemic respiratory failure noted to have left-sided large effusion and right-sided mild to moderate effusion.  Status postthoracentesis x 2 with over 1 L drained.  This is a third time that he is shown to have a large pleural effusion in stent 3 months.  He did receive pleural drain upon arrival to the ER with 1500 cc serosanguineous return overnight and active drainage this a.m. and up to 150 cc before lunchtime.  On interview and physical exam patient reports slight improvement mild chest discomfort at pigtail insertion site.  Chest tube management displays no airleak upon forced expiratory maneuver.   04/14/22- patient examined at bedside, s/p repeat CBC with severe anemia requiring PRBC transfusion. S/p [place] abd no ascites. Chest tube in place on left , persistent and recurrent effusion present on CXR 1/28.  Will repeat CXR again today.  On Lasix gtt with 600cc urine overnight. Development of transient hypotension with req for midodrine.   04/15/22-  patient reports nausea. He is on oxycodone.  He appears less swollen today. Overall he is net 1.3L negative and has active fluid per Chest  tube. He needs to start using incentive spirometer today.    04/16/22- patient had 150cc this am per chest tube.  Patient reports improvement clinically.  Continue chest tube for now.   04/17/22- patient on 2L/min nasal canula.  He still has straw colored phlegm returning from chest tube.  He coughed up phlegm with blood/rusty colored. He had obstructiive urophathy and had foley placed feels a lot better now. He is constipated but did have BM today shares he is much improved.  04/18/22- patient is resting in bed.  Still has straw colored return overnight >250cc which will likely continue.  He had cardiology DR [name] come to evaluate him today.   Renal function seems to be improving and I will repeat cxr today for interval effusion changes.  04/19/22- patient aspirated today and was coughing a lot during my evaluation.  Have ordered extra neb treatment.  He is on 2L/min [place].  Overnight >100cc return to chest tube, still unable to come out.  Have ordered physical therapy.   04/20/22- patient with <50cc overnight from chest tube. No air leak noted. He is with nonlabored breathing. He is on 2L/min  but using long cordage and is only receiving 1L to nares.  He's urinating well on aldactone 50/demadex 100 daily with Net negative 13L.  Renal function is improved. He's able to ambulate now.   04/21/22- patient is feeling better, he ws able to get up but not walk.  He had another 200cc output from chest tube.  Anemia is improved and renal function has improved, patient making good urine output. He is on PRN dilaudid and oxycodone, I recommend to stop both of those if possible.   PAST MEDICAL HISTORY   Past Medical History:  Diagnosis Date   Arthritis    CHF (congestive heart failure), NYHA class II, acute on chronic, systolic (HCC)    EF 26% 10/3417   Coronary artery disease    [facility]   Diabetes mellitus without complication ([place])    Dysplastic nevus 08/26/2020   L flank, moderate atypia    Hyperlipidemia    Hypertension    Melanoma ([place]) 08/20/2015   Right neck. Superficial spreading, arising in nevus. Tumor thickness 1.66m, Anatomic level IV   Melanoma in situ ([place] 05/22/2020   R lateral neck ant to scar, exc 05/22/20   Numbness in right leg    due to back   Skin cancer    removed l arm   Spinal stenosis    Stented coronary artery 2000   X 1 STENT   Swelling of both lower extremities      SURGICAL HISTORY   Past Surgical History:  Procedure Laterality Date   CATARACT EXTRACTION W/PHACO Right 05/03/2019   Procedure: CATARACT EXTRACTION PHACO AND INTRAOCULAR LENS PLACEMENT (IOC) RIGHT DIABETIC 6.44 00:58.4 11.0%;  Surgeon: BLeandrew Koyanagi MD;  [facility]  Service: Ophthalmology;  Laterality: Right;  DIABETIC   CATARACT EXTRACTION W/PHACO Left 06/28/2019   Procedure: CATARACT EXTRACTION PHACO AND INTRAOCULAR LENS PLACEMENT (IOC) LEFT DIABETIC 7.99  01:13.4  10.9% ;  Surgeon: BLeandrew Koyanagi MD;  [facility]  Service: Ophthalmology;  Laterality: Left;  Diabetic - insulin and oral meds   CHOLECYSTECTOMY     JOINT REPLACEMENT     RT KNEE   LUMBAR LAMINECTOMY/DECOMPRESSION MICRODISCECTOMY  04/13/2012   Procedure: LUMBAR LAMINECTOMY/DECOMPRESSION MICRODISCECTOMY 1 LEVEL;  Surgeon: JJohnn Hai MD;  Location: [facility];  Service: Orthopedics;  Laterality: Bilateral;  L4-L5   UVULECTOMY  1990's     FAMILY HISTORY   History reviewed. No pertinent family history.   SOCIAL HISTORY   Social History   Tobacco Use   Smoking status: Never   Smokeless tobacco: Never  Vaping Use   Vaping Use: Never used  Substance Use Topics   Alcohol use: Yes    Comment: rare   Drug use: No     MEDICATIONS    Home Medication:     Current Medication:  Current Facility-Administered Medications:    0.9 %  sodium chloride infusion (Manually program via Guardrails IV Fluids), , Intravenous, Once, ZSharen Hones MD    acetaminophen (TYLENOL) tablet 650 mg, 650 mg, Oral, Q6H PRN **OR** acetaminophen (TYLENOL) suppository 650 mg, 650 mg, Rectal, Q6H PRN, MSid Falcon MD   ascorbic acid (VITAMIN C) tablet 500 mg, 500 mg, Oral, Daily, KVal Riles MD, 500 mg at 04/21/22 0938   atorvastatin (LIPITOR) tablet 80 mg, 80 mg, Oral, Daily, MGilles ChiquitoB, MD, 80 mg at 04/21/22 06222  bisacodyl (DULCOLAX) EC tablet 10 mg, 10 mg, Oral, QHS, KVal Riles MD, 10 mg at 04/20/22 2121   bisacodyl (DULCOLAX) suppository 10 mg, 10 mg, Rectal, Daily PRN, KVal Riles MD   Chlorhexidine Gluconate Cloth 2 % PADS 6 each, 6 each, Topical, Daily, KVal Riles MD, 6 each at 04/20/22 1615   enoxaparin (LOVENOX) injection 62.5 mg, 0.5 mg/kg, Subcutaneous, Q24H, Coulter, Carolyn, RPH   guaiFENesin (MUCINEX) 12 hr tablet 600 mg,  600 mg, Oral, BID, Val Riles, MD, 600 mg at 04/21/22 5284   guaiFENesin-dextromethorphan (ROBITUSSIN DM) 100-10 MG/5ML syrup 5 mL, 5 mL, Oral, Q4H PRN, Gilles Chiquito B, MD, 5 mL at 04/13/22 2154   HYDROmorphone (DILAUDID) injection 0.5-1 mg, 0.5-1 mg, Intravenous, Q2H PRN, Gilles Chiquito B, MD, 1 mg at 04/14/22 2136   insulin aspart (novoLOG) injection 0-15 Units, 0-15 Units, Subcutaneous, TID WC, Gilles Chiquito B, MD, 3 Units at 04/21/22 0939   insulin aspart (novoLOG) injection 0-5 Units, 0-5 Units, Subcutaneous, QHS, Gilles Chiquito B, MD, 4 Units at 04/20/22 2127   insulin glargine-yfgn (SEMGLEE) injection 25 Units, 25 Units, Subcutaneous, QHS, Sharen Hones, MD, 25 Units at 04/20/22 2126   ipratropium-albuterol (DUONEB) 0.5-2.5 (3) MG/3ML nebulizer solution 3 mL, 3 mL, Nebulization, BID, Val Riles, MD, 3 mL at 04/21/22 0747   iron polysaccharides (NIFEREX) capsule 150 mg, 150 mg, Oral, Daily, Val Riles, MD, 150 mg at 04/21/22 1324   methocarbamol (ROBAXIN) 500 mg in dextrose 5 % 50 mL IVPB, 500 mg, Intravenous, Q6H PRN, Sid Falcon, MD   ondansetron (ZOFRAN) tablet 4 mg, 4 mg, Oral, Q6H PRN  **OR** ondansetron (ZOFRAN) injection 4 mg, 4 mg, Intravenous, Q6H PRN, Gilles Chiquito B, MD, 4 mg at 04/17/22 0920   oxyCODONE (Oxy IR/ROXICODONE) immediate release tablet 5 mg, 5 mg, Oral, Q4H PRN, Gilles Chiquito B, MD, 5 mg at 04/20/22 1625   polyethylene glycol (MIRALAX / GLYCOLAX) packet 17 g, 17 g, Oral, BID, Val Riles, MD, 17 g at 04/21/22 4010   sodium chloride flush (NS) 0.9 % injection 3 mL, 3 mL, Intravenous, Q12H, Gilles Chiquito B, MD, 3 mL at 04/20/22 2128   spironolactone (ALDACTONE) tablet 50 mg, 50 mg, Oral, Daily, Breeze, Benancio Deeds, NP, 50 mg at 04/21/22 2725   tacrolimus (PROGRAF) capsule 1 mg, 1 mg, Oral, BID, Murlean Iba, MD, 1 mg at 04/21/22 3664   tamsulosin (FLOMAX) capsule 0.4 mg, 0.4 mg, Oral, QPC supper, Val Riles, MD, 0.4 mg at 04/20/22 1802   torsemide (DEMADEX) tablet 100 mg, 100 mg, Oral, Daily, Breeze, Benancio Deeds, NP, 100 mg at 04/21/22 4034   Vitamin D (Ergocalciferol) (DRISDOL) 1.25 MG (50000 UNIT) capsule 50,000 Units, 50,000 Units, Oral, Q7 days, Val Riles, MD, 50,000 Units at 04/17/22 1228    ALLERGIES   Carvedilol, Metoprolol, Toprol xl [metoprolol tartrate], Buprenorphine, Buprenorphine hcl, Morphine, Morphine and related, Triamcinolone, and Triamcinolone acetonide     REVIEW OF SYSTEMS    Review of Systems:  Gen:  Denies  fever, sweats, chills weigh loss  HEENT: Denies blurred vision, double vision, ear pain, eye pain, hearing loss, nose bleeds, sore throat Cardiac:  No dizziness, chest pain or heaviness, chest tightness,edema Resp:   reports dyspnea chronically  Gi: Denies swallowing difficulty, stomach pain, nausea or vomiting, diarrhea, constipation, bowel incontinence Gu:  Denies bladder incontinence, burning urine Ext:   Denies Joint pain, stiffness or swelling Skin: Denies  skin rash, easy bruising or bleeding or hives Endoc:  Denies polyuria, polydipsia , polyphagia or weight change Psych:   Denies depression, insomnia or  hallucinations   Other:  All other systems negative   VS: BP (!) 130/59 (BP Location: Left Arm)   Pulse 84   Temp 97.9 F (36.6 C)   Resp 16   Ht 6' (1.829 m)   Wt 124.8 kg   SpO2 99%   BMI 37.31 kg/m      PHYSICAL EXAM    GENERAL:NAD, no fevers, chills, no weakness no  fatigue HEAD: Normocephalic, atraumatic.  EYES: Pupils equal, round, reactive to light. Extraocular muscles intact. No scleral icterus.  MOUTH: Moist mucosal membrane. Dentition intact. No abscess noted.  EAR, NOSE, THROAT: Clear without exudates. No external lesions.  NECK: Supple. No thyromegaly. No nodules. No JVD.  PULMONARY: decreased breath sounds with mild rhonchi worse at bases bilaterally.  CARDIOVASCULAR: S1 and S2. Regular rate and rhythm. No murmurs, rubs, or gallops. No edema. Pedal pulses 2+ bilaterally.  GASTROINTESTINAL: Soft, nontender, nondistended. No masses. Positive bowel sounds. No hepatosplenomegaly.  MUSCULOSKELETAL: No swelling, clubbing, or edema. Range of motion full in all extremities.  NEUROLOGIC: Cranial nerves II through XII are intact. No gross focal neurological deficits. Sensation intact. Reflexes intact.  SKIN: No ulceration, lesions, rashes, or cyanosis. Skin warm and dry. Turgor intact.  PSYCHIATRIC: Mood, affect within normal limits. The patient is awake, alert and oriented x 3. Insight, judgment intact.       IMAGING     ASSESSMENT/PLAN   Acute on chronic hypoxemic respiratory failure Present on admission due to influenza A infection with recurrent left pleural effusion and associated compressive atelectasis -Patient's evaluated by nephrology and is status post Lasix drip as well as pigtail chest tube on the left with 400cc serosanguineous fluid overnight and active drain during examination today -Is no air leak upon forced aspiratory maneuver, chest tube is not clogged and fluid is freely flowing. -Fluid was previously tested and was noted to be lymphocyte  predominant fluid and became exudative upon initiation of diuretics which is known to happen.  Does not appear to be empyema and I do not think he is a very good candidate for intrapleural thrombolytics.  Patient may be heading towards dialysis but if not he may also benefit from thoracic surgery evaluation and possible pleurodesis.     CKD - per nephro    Rutixan plan at some point.    Thank you for allowing me to participate in the care of this patient.   Patient/Family are satisfied with care plan and all questions have been answered.    Provider disclosure: Patient with at least one acute or chronic illness or injury that poses a threat to life or bodily function and is being managed actively during this encounter.  All of the below services have been performed independently by signing provider:  review of prior documentation from internal and or external health records.  Review of previous and current lab results.  Interview and comprehensive assessment during patient visit today. Review of current and previous chest radiographs/CT scans. Discussion of management and test interpretation with health care team and patient/family.   This document was prepared using Dragon voice recognition software and may include unintentional dictation errors.     Ottie Glazier, M.D.  Division of Pulmonary & Critical Care Medicine

## 2022-04-21 NOTE — Consult Note (Signed)
PHARMACIST - PHYSICIAN COMMUNICATION  CONCERNING:  Enoxaparin (Lovenox) for DVT Prophylaxis    RECOMMENDATION: Patient was prescribed enoxaprin 70 mg q24 hours for VTE prophylaxis.   Filed Weights   04/19/22 0400 04/20/22 0500 04/21/22 0414  Weight: 124.6 kg (274 lb 11.1 oz) 124.2 kg (273 lb 13 oz) 124.8 kg (275 lb 2.2 oz)    Body mass index is 37.31 kg/m.  Estimated Creatinine Clearance: 67.5 mL/min (A) (by C-G formula based on SCr of 1.33 mg/dL (H)).   Based on [place] patient is receiving enoxaparin 0.'5mg'$ /kg TBW SQ every 24 hours based on BMI being >30.  DESCRIPTION: Pharmacy has adjusted enoxaparin dose per [facility] policy. Patient has undergone significant diuresis and weight change. Enoxaparin dose updated per current weight.  Patient is now receiving enoxaparin 62.5 mg every 24 hours   Gretel Acre, PharmD PGY1 Pharmacy Resident 04/21/2022 7:49 AM

## 2022-04-22 ENCOUNTER — Inpatient Hospital Stay: Payer: Medicare HMO

## 2022-04-22 DIAGNOSIS — J101 Influenza due to other identified influenza virus with other respiratory manifestations: Secondary | ICD-10-CM | POA: Diagnosis not present

## 2022-04-22 LAB — GLUCOSE, CAPILLARY
Glucose-Capillary: 207 mg/dL — ABNORMAL HIGH (ref 70–99)
Glucose-Capillary: 279 mg/dL — ABNORMAL HIGH (ref 70–99)
Glucose-Capillary: 240 mg/dL — ABNORMAL HIGH (ref 70–99)
Glucose-Capillary: 263 mg/dL — ABNORMAL HIGH (ref 70–99)

## 2022-04-22 LAB — BASIC METABOLIC PANEL
Anion gap: 7 (ref 5–15)
BUN: 41 mg/dL — ABNORMAL HIGH (ref 8–23)
CO2: 32 mmol/L (ref 22–32)
Calcium: 8 mg/dL — ABNORMAL LOW (ref 8.9–10.3)
Chloride: 100 mmol/L (ref 98–111)
Creatinine, Ser: 1.29 mg/dL — ABNORMAL HIGH (ref 0.61–1.24)
GFR, Estimated: 59 mL/min — ABNORMAL LOW (ref >60)
Glucose, Bld: 232 mg/dL — ABNORMAL HIGH (ref 70–99)
Potassium: 3.9 mmol/L (ref 3.5–5.1)
Sodium: 139 mmol/L (ref 135–145)

## 2022-04-22 LAB — CBC
HCT: 26.2 % — ABNORMAL LOW (ref 39.0–52.0)
Hemoglobin: 8.3 g/dL — ABNORMAL LOW (ref 13.0–17.0)
MCH: 28.2 pg (ref 26.0–34.0)
MCHC: 31.7 g/dL (ref 30.0–36.0)
MCV: 89.1 fL (ref 80.0–100.0)
Platelets: 269 10*3/uL (ref 150–400)
RBC: 2.94 MIL/uL — ABNORMAL LOW (ref 4.22–5.81)
RDW: 15.4 % (ref 11.5–15.5)
WBC: 6.9 10*3/uL (ref 4.0–10.5)
nRBC: 0 % (ref 0.0–0.2)

## 2022-04-22 MED ORDER — IPRATROPIUM-ALBUTEROL 0.5-2.5 (3) MG/3ML IN SOLN: 3.0000 mL | RESPIRATORY_TRACT | Status: DC | PRN

## 2022-04-22 NOTE — Inpatient Diabetes Management (Signed)
Inpatient Diabetes Program Recommendations  AACE/ADA: New Consensus Statement on Inpatient Glycemic Control   Target Ranges:  Prepandial:   less than 140 mg/dL      Peak postprandial:   less than 180 mg/dL (1-2 hours)      Critically ill patients:  140 - 180 mg/dL    Latest Reference Range & Units 04/21/22 07:23 04/21/22 15:54 04/21/22 20:08 04/22/22 08:18  Glucose-Capillary 70 - 99 mg/dL 192 (H) 251 (H) 281 (H) 207 (H)   Review of Glycemic Control  Diabetes history: DM2 Outpatient Diabetes medications: Lantus 34 units daily, Novolog 50 units with breakfast, Novolog 60 units with lunch, Glipizide XL 5 mg daily, Jardiance 10 mg daily, Metformin 1000 mg BID Current orders for Inpatient glycemic control: Semglee 28 units QHS, Novolog 0-15 units TID with meals, Novolog 0-5 units QHS, Novolog 6 units TID with meals  Inpatient Diabetes Program Recommendations:    Insulin: Please consider increasing Semglee to 30 units QHS and meal coverage to Novolog 9 units TID with meals.  Thanks, Barnie Alderman, RN, MSN, [place] [facility] Coordinator Inpatient Diabetes Program [phone] (Team Pager from 8am to [place])

## 2022-04-22 NOTE — Progress Notes (Signed)
[facility] Progress Note  Patient: [name]    ZOX:[id number]  DOA: 04/11/2022     Date of Service: the patient was seen and examined on 04/22/2022  Chief Complaint  Patient presents with   Shortness of Breath   Brief hospital course: [name] is a 72 y.o. male with PMH of arthritis, CHF (chronic), CAD, DM2, HLD, HTN, h/o treated melanoma, spinal stenosis who presents for worsening cough, fatigue and sob. Had low-grade fever, he was found to have positive for influenza A. He had recurrent left-sided pleural effusion, had a thoracentesis x 2 during the last months.  Upon arrival emergency room, chest x-ray showed large left-sided pleural effusion again, chest tube was placed by ED physician. Patient also developed significant hypoxemia with oxygen saturation as low as 76% on room air this morning.  Treated with oxygen. Patient has severe volume overload with evidence of bichamber heart failure.  Echocardiogram showed EF 45-50% with grade 2 diastolic dysfunction. Pulm valve not visualized.  Furosemide drip started after seen by nephrology.   1/29.  Patient had minimal urine after Lasix, but chest tube had drained additional 1500 mL in 24 hours.    Assessment and Plan: Acute on chronic combined systolic and diastolic congestive heart failure Acute hypoxemic respiratory failure secondary to volume overload. Elevated troponin with NSTEMI. Coronary artery disease. Continue supplemental O2 [place] and gradually wean off Biventricular heart failure, significant volume overload, edema of bilateral lower extremities, elevated BNP Abdominal ultrasound did not show any ascites. Nephro and cardiology consulted S/p Lasix IV infusion, discontinued on 2/2, started torsemide 100 mg p.o. daily and Aldactone 50 mg p.o. daily Patient was on valsartan 150 mg p.o. daily at home, cardiology recommended to restart as BP allows.  Patient has intolerance to metoprolol and Coreg (itching).  Cardio  signed off. Good urine output after Foley catheter insertion   Recurrent left side pleural effusion.  Hemothorax ruled out. Chest tube was placed in the emergency room, removed 2 L of serous fluids.  No evidence of bleeding.  Based on prior lab test, cytology was negative.  Prior test also does not indicate infection.  Reviewed chest x-ray post chest tube, no evidence of pneumonia. Procalcitonin level not significant elevated at 0.2 with chronic renal function.  No evidence of infection.  Patient is followed by pulmonology, patient appears to have rapid accumulation of pleural fluid.  Had a discussion with CT surgery, pulmonology and nephrology.  Patient still has a large amount of the drain from the chest tube, patient cannot be discharged with chest tube in place.  The source of increased pleural effusion is due to heart failure, third spacing from renal disease.  s/p IV albumin and diuretics.  2/3  CXR  Stable position of left chest tube. No pneumothorax. No change in aeration to the left lower lung.   Influenza A. S/p Tamiflu for 5 days, last dose on 04/16/22 2/1 started Mucinex 600 mg p.o. twice daily due to chest congestion   Acute kidney injury on chronic kidney disease stage II. Idiopathic membranous glomerulonephritis. Hyponatremia. Patient has a worsening renal function on chronic renal disease.  He is grossly volume overloaded, he is started on Lasix drip.  Renal function is gradually getting worse.  There is a possibility that patient may need a dialysis. Nephrology is working with oncology to start rituximab.  2/4 started tacrolimus 1 mg p.o. twice daily until patient is able to take rituximab 2/7 Creatinine 1.29, gradually improving  Iron deficient  anemia. Acute blood loss anemia ruled out. Patient received IV iron, hemoglobin dropped down to 6.9, chest tube fluids does not seem to be bloody.  s/p 1 unit PRBC.  No evidence of GI bleed. Hb 7.2 --7.5--7.8--8.3 Continue to  monitor H&H and transfuse if hemoglobin less than 7   Uncontrolled type 2 diabetes with hyperglycemia Continue long-acting insulin, sliding scale insulin.   Essential hypertension Continue to monitor.   Morbid obesity BMI 41.47, Likely obstructive sleep apnea. Diet exercise. Will decide if patient need BiPAP before discharge.   Urinary retention, Foley catheter was inserted on 04/16/2022 Started Flomax Continue Foley catheter for 1 week and then DC when patient is more ambulatory  Vitamin D deficiency: started vitamin D 50,000 units p.o. weekly, follow with PCP to repeat vitamin D level after 3 to 6 months.   Body mass index is 41.47 kg/m.  Interventions:       Diet: Carb modified diet DVT Prophylaxis: Subcutaneous Lovenox   Advance goals of care discussion: Full code  Family Communication: family was not present at bedside, at the time of interview.  The pt provided permission to discuss medical plan with the family. Opportunity was given to ask question and all questions were answered satisfactorily.   Disposition:  Pt is from Home, admitted with anasarca and left pleural effusion, still has left-sided chest tube, which precludes a safe discharge. Discharge to SNF, when clinically stable, needs pulmonary clearance.  Subjective: No significant events overnight, patient was lying comfortably in the bed, feels improvement in the shortness of breath, lower extremity edema.  Denied any chest pain or palpitation, no any other active issues.   Physical Exam: General: NAD, lying comfortably Appear in no distress, affect appropriate Eyes: PERRLA ENT: Oral Mucosa Clear, moist  Neck: no JVD,  Cardiovascular: S1 and S2 Present, no Murmur,  Respiratory: good respiratory effort, Bilateral Air entry equal, mild bibasilar crackles, no wheezing, left sided chest tube intact Abdomen: Bowel Sound present, Soft and no tenderness,  Skin: no rashes Extremities: 2+ Pedal edema, no calf  tenderness.  Edema is gradually improving, dryness improving after Eucerin cream application. Neurologic: without any new focal findings Gait not checked due to patient safety concerns  Vitals:   04/22/22 0500 04/22/22 0718 04/22/22 0817 04/22/22 1201  BP:   137/63 (!) 128/54  Pulse:   95 93  Resp:   14 18  Temp:   98.3 F (36.8 C) 97.8 F (36.6 C)  TempSrc:      SpO2:  97% 99% 100%  Weight: 123.7 kg     Height:        Intake/Output Summary (Last 24 hours) at 04/22/2022 1234 Last data filed at 04/22/2022 1100 Gross per 24 hour  Intake 963 ml  Output 1700 ml  Net -737 ml   Filed Weights   04/20/22 0500 04/21/22 0414 04/22/22 0500  Weight: 124.2 kg 124.8 kg 123.7 kg    Data Reviewed: I have personally reviewed and interpreted daily labs, tele strips, imagings as discussed above. I reviewed all nursing notes, pharmacy notes, vitals, pertinent old records I have discussed plan of care as described above with RN and patient/family.  CBC: Recent Labs  Lab 04/18/22 0455 04/19/22 0532 04/20/22 0520 04/21/22 0554 04/22/22 0547  WBC 5.0 5.2 5.9 6.2 6.9  HGB 7.2* 7.5* 7.8* 8.4* 8.3*  HCT 22.5* 23.9* 24.4* 26.9* 26.2*  MCV 88.9 89.2 88.4 88.5 89.1  PLT 192 212 234 269 106   Basic Metabolic Panel: Recent  Labs  Lab 04/16/22 0445 04/17/22 0443 04/18/22 0455 04/19/22 0532 04/20/22 0520 04/21/22 0554 04/22/22 0547  NA 137 137 138 141 140 139 139  K 3.5 4.0 3.6 3.6 3.7 4.4 3.9  CL 101 102 103 102 99 98 100  CO2 '26 29 27 31 '$ 32 33* 32  GLUCOSE 114* 176* 134* 233* 215* 202* 232*  BUN 83* 77* 74* 64* 53* 46* 41*  CREATININE 2.08* 2.03* 1.84* 1.68* 1.45* 1.33* 1.29*  CALCIUM 7.7* 7.8* 7.7* 8.0* 8.0* 8.2* 8.0*  MG 2.5* 2.3 2.2  --   --   --   --   PHOS 4.6 3.6 2.9  --   --   --   --     Studies: No results found.  Scheduled Meds:  sodium chloride   Intravenous Once   atorvastatin  80 mg Oral Daily   bisacodyl  10 mg Oral QHS   Chlorhexidine Gluconate Cloth  6 each  Topical Daily   enoxaparin (LOVENOX) injection  0.5 mg/kg Subcutaneous Q24H   guaiFENesin  600 mg Oral BID   hydrocerin   Topical BID   insulin aspart  0-15 Units Subcutaneous TID WC   insulin aspart  0-5 Units Subcutaneous QHS   insulin aspart  6 Units Subcutaneous TID WC   insulin glargine-yfgn  28 Units Subcutaneous QHS   iron polysaccharides  150 mg Oral Daily   polyethylene glycol  17 g Oral BID   sodium chloride flush  3 mL Intravenous Q12H   spironolactone  50 mg Oral Daily   tacrolimus  1 mg Oral BID   tamsulosin  0.4 mg Oral QPC supper   torsemide  100 mg Oral Daily   Vitamin D (Ergocalciferol)  50,000 Units Oral Q7 days   Continuous Infusions:  methocarbamol (ROBAXIN) IV     PRN Meds: acetaminophen **OR** acetaminophen, bisacodyl, guaiFENesin-dextromethorphan, ipratropium-albuterol, methocarbamol (ROBAXIN) IV, ondansetron **OR** ondansetron (ZOFRAN) IV, oxyCODONE  Time spent: 35 minutes  Author: Val Riles. MD [facility] 04/22/2022 12:34 PM  To reach On-call, see care teams to locate the attending and reach out to them via [email or web address]. If 7PM-7AM, please contact night-coverage If you still have difficulty reaching the attending provider, please page the [facility] [place] (Director on Call) for [facility] on amion for assistance.

## 2022-04-22 NOTE — Progress Notes (Signed)
PULMONOLOGY         Date: 04/22/2022,   MRN# [id number] [name] 11-May-1950     AdmissionWeight: (!) 138.7 kg                 CurrentWeight: 123.7 kg  Referring provider: Dr Roosevelt Locks   CHIEF COMPLAINT:   Parapneumonic effusion s/p chest tube placement    HISTORY OF PRESENT ILLNESS   This is a pleasant 72 year old male with severe morbid obesity chronic 3+ weeping lower extremity edema due to CKD with Membranous glomerulonephritis disease and recent evaluation with nephrology for rituximab infusion, history of CHF with reduced EF at 40%, osteoarthritis, diabetes, history of melanoma, neuropathy, spinal stenosis with chief complaint of shortness of breath and chest discomfort.  Patient is well-known to our pulmonology service with [facility] clinic we recently saw him for acute on chronic hypoxemic respiratory failure noted to have left-sided large effusion and right-sided mild to moderate effusion.  Status postthoracentesis x 2 with over 1 L drained.  This is a third time that he is shown to have a large pleural effusion in stent 3 months.  He did receive pleural drain upon arrival to the ER with 1500 cc serosanguineous return overnight and active drainage this a.m. and up to 150 cc before lunchtime.  On interview and physical exam patient reports slight improvement mild chest discomfort at pigtail insertion site.  Chest tube management displays no airleak upon forced expiratory maneuver.   04/14/22- patient examined at bedside, s/p repeat CBC with severe anemia requiring PRBC transfusion. S/p [place] abd no ascites. Chest tube in place on left , persistent and recurrent effusion present on CXR 1/28.  Will repeat CXR again today.  On Lasix gtt with 600cc urine overnight. Development of transient hypotension with req for midodrine.   04/15/22-  patient reports nausea. He is on oxycodone.  He appears less swollen today. Overall he is net 1.3L negative and has active fluid per Chest  tube. He needs to start using incentive spirometer today.    04/16/22- patient had 150cc this am per chest tube.  Patient reports improvement clinically.  Continue chest tube for now.   04/17/22- patient on 2L/min nasal canula.  He still has straw colored phlegm returning from chest tube.  He coughed up phlegm with blood/rusty colored. He had obstructiive urophathy and had foley placed feels a lot better now. He is constipated but did have BM today shares he is much improved.  04/18/22- patient is resting in bed.  Still has straw colored return overnight >250cc which will likely continue.  He had cardiology DR [name] come to evaluate him today.   Renal function seems to be improving and I will repeat cxr today for interval effusion changes.  04/19/22- patient aspirated today and was coughing a lot during my evaluation.  Have ordered extra neb treatment.  He is on 2L/min [place].  Overnight >100cc return to chest tube, still unable to come out.  Have ordered physical therapy.   04/20/22- patient with <50cc overnight from chest tube. No air leak noted. He is with nonlabored breathing. He is on 2L/min [place] but using long cordage and is only receiving 1L to nares.  He's urinating well on aldactone 50/demadex 100 daily with Net negative 13L.  Renal function is improved. He's able to ambulate now.   04/21/22- patient is feeling better, he ws able to get up but not walk.  He had another 200cc output from chest tube.  Anemia is improved and renal function has improved, patient making good urine output. He is on PRN dilaudid and oxycodone, I recommend to stop both of those if possible.    04/22/22- patient had further improvement in renal function with FGR now 59, still he is making daily effusion per chest tube >100cc.  Id like to remove pigtail but fluid is actively being produced.  He is walking around and feeling much improved. He is on room air.   PAST MEDICAL HISTORY   Past Medical History:  Diagnosis Date   Arthritis     CHF (congestive heart failure), NYHA class II, acute on chronic, systolic (HCC)    EF 63% 05/3543   Coronary artery disease    [facility]   Diabetes mellitus without complication ([place])    Dysplastic nevus 08/26/2020   L flank, moderate atypia   Hyperlipidemia    Hypertension    Melanoma ([place]) 08/20/2015   Right neck. Superficial spreading, arising in nevus. Tumor thickness 1.2m, Anatomic level IV   [place] 05/22[place]   R lateral neck ant to scar, exc 05/22/20   Numbness in right leg    due to back   Skin cancer    removed l arm   Spinal stenosis    Stented coronary artery 2000   X 1 STENT   Swelling of both lower extremities      SURGICAL HISTORY   Past Surgical History:  Procedure Laterality Date   CATARACT EXTRACTION W/PHACO Right 05/03/2019   Procedure: CATARACT EXTRACTION PHACO AND INTRAOCULAR LENS PLACEMENT (IOC) RIGHT DIABETIC 6.44 00:58.4 11.0%;  Surgeon: BLeandrew Koyanagi MD;  [facility] [facility]  Service: Ophthalmology;  Laterality: Right;  DIABETIC   CATARACT EXTRACTION W/PHACO Left 06/28/2019   Procedure: CATARACT EXTRACTION PHACO AND INTRAOCULAR LENS PLACEMENT (IOC) LEFT DIABETIC 7.99  01:13.4  10.9% ;  Surgeon: BLeandrew Koyanagi MD;  [facility]  Service: Ophthalmology;  Laterality: Left;  Diabetic - insulin and oral meds   CHOLECYSTECTOMY     JOINT REPLACEMENT     RT KNEE   LUMBAR LAMINECTOMY/DECOMPRESSION MICRODISCECTOMY  04/13/2012   Procedure: LUMBAR LAMINECTOMY/DECOMPRESSION MICRODISCECTOMY 1 LEVEL;  Surgeon: JJohnn Hai MD;  Location: [facility];  Service: Orthopedics;  Laterality: Bilateral;  L4-L5   UVULECTOMY  1990's     FAMILY HISTORY   History reviewed. No pertinent family history.   SOCIAL HISTORY   Social History   Tobacco Use   Smoking status: Never   Smokeless tobacco: Never  Vaping Use   Vaping Use: Never used  Substance Use Topics   Alcohol use: Yes     Comment: rare   Drug use: No     MEDICATIONS    Home Medication:     Current Medication:  Current Facility-Administered Medications:    0.9 %  sodium chloride infusion (Manually program via Guardrails IV Fluids), , Intravenous, Once, ZSharen Hones MD   acetaminophen (TYLENOL) tablet 650 mg, 650 mg, Oral, Q6H PRN **OR** acetaminophen (TYLENOL) suppository 650 mg, 650 mg, Rectal, Q6H PRN, MGilles ChiquitoB, MD   atorvastatin (LIPITOR) tablet 80 mg, 80 mg, Oral, Daily, MGilles ChiquitoB, MD, 80 mg at 04/22/22 0911   bisacodyl (DULCOLAX) EC tablet 10 mg, 10 mg, Oral, QHS, KVal Riles MD, 10 mg at 04/21/22 2233   bisacodyl (DULCOLAX) suppository 10 mg, 10 mg, Rectal, Daily PRN, KVal Riles MD   Chlorhexidine Gluconate Cloth 2 % PADS 6 each, 6 each, Topical, Daily, KDwyane Dee  Dileep, MD, 6 each at 04/22/22 1058   enoxaparin (LOVENOX) injection 62.5 mg, 0.5 mg/kg, Subcutaneous, Q24H, Coulter, Carolyn, RPH, 62.5 mg at 04/21/22 2232   guaiFENesin (MUCINEX) 12 hr tablet 600 mg, 600 mg, Oral, BID, Val Riles, MD, 600 mg at 04/22/22 0910   guaiFENesin-dextromethorphan (ROBITUSSIN DM) 100-10 MG/5ML syrup 5 mL, 5 mL, Oral, Q4H PRN, Sid Falcon, MD, 5 mL at 04/13/22 2154   hydrocerin (EUCERIN) cream, , Topical, BID, Val Riles, MD, Given at 04/22/22 0908   insulin aspart (novoLOG) injection 0-15 Units, 0-15 Units, Subcutaneous, TID WC, Val Riles, MD, 8 Units at 04/22/22 1245   insulin aspart (novoLOG) injection 0-5 Units, 0-5 Units, Subcutaneous, QHS, Val Riles, MD, 3 Units at 04/21/22 2238   insulin aspart (novoLOG) injection 6 Units, 6 Units, Subcutaneous, TID WC, Val Riles, MD, 6 Units at 04/22/22 1245   insulin glargine-yfgn (SEMGLEE) injection 28 Units, 28 Units, Subcutaneous, QHS, Val Riles, MD, 28 Units at 04/21/22 2239   ipratropium-albuterol (DUONEB) 0.5-2.5 (3) MG/3ML nebulizer solution 3 mL, 3 mL, Nebulization, Q4H PRN, Val Riles, MD   iron polysaccharides  (NIFEREX) capsule 150 mg, 150 mg, Oral, Daily, Val Riles, MD, 150 mg at 04/22/22 9326   methocarbamol (ROBAXIN) 500 mg in dextrose 5 % 50 mL IVPB, 500 mg, Intravenous, Q6H PRN, Sid Falcon, MD   ondansetron (ZOFRAN) tablet 4 mg, 4 mg, Oral, Q6H PRN **OR** ondansetron (ZOFRAN) injection 4 mg, 4 mg, Intravenous, Q6H PRN, Gilles Chiquito B, MD, 4 mg at 04/17/22 0920   oxyCODONE (Oxy IR/ROXICODONE) immediate release tablet 5 mg, 5 mg, Oral, Q6H PRN, Val Riles, MD   polyethylene glycol (MIRALAX / GLYCOLAX) packet 17 g, 17 g, Oral, BID, Val Riles, MD, 17 g at 04/22/22 7124   sodium chloride flush (NS) 0.9 % injection 3 mL, 3 mL, Intravenous, Q12H, Gilles Chiquito B, MD, 3 mL at 04/22/22 0910   spironolactone (ALDACTONE) tablet 50 mg, 50 mg, Oral, Daily, Breeze, Benancio Deeds, NP, 50 mg at 04/22/22 0907   tacrolimus (PROGRAF) capsule 1 mg, 1 mg, Oral, BID, Murlean Iba, MD, 1 mg at 04/22/22 5809   tamsulosin (FLOMAX) capsule 0.4 mg, 0.4 mg, Oral, QPC supper, Val Riles, MD, 0.4 mg at 04/21/22 1827   torsemide (DEMADEX) tablet 100 mg, 100 mg, Oral, Daily, Breeze, Shantelle, NP, 100 mg at 04/22/22 0906   Vitamin D (Ergocalciferol) (DRISDOL) 1.25 MG (50000 UNIT) capsule 50,000 Units, 50,000 Units, Oral, Q7 days, Val Riles, MD, 50,000 Units at 04/17/22 1228    ALLERGIES   Carvedilol, Metoprolol, Toprol xl [metoprolol tartrate], Buprenorphine, Buprenorphine hcl, Morphine, Morphine and related, Triamcinolone, and Triamcinolone acetonide     REVIEW OF SYSTEMS    Review of Systems:  Gen:  Denies  fever, sweats, chills weigh loss  HEENT: Denies blurred vision, double vision, ear pain, eye pain, hearing loss, nose bleeds, sore throat Cardiac:  No dizziness, chest pain or heaviness, chest tightness,edema Resp:   reports dyspnea chronically  Gi: Denies swallowing difficulty, stomach pain, nausea or vomiting, diarrhea, constipation, bowel incontinence Gu:  Denies bladder incontinence,  burning urine Ext:   Denies Joint pain, stiffness or swelling Skin: Denies  skin rash, easy bruising or bleeding or hives Endoc:  Denies polyuria, polydipsia , polyphagia or weight change Psych:   Denies depression, insomnia or hallucinations   Other:  All other systems negative   VS: BP (!) 128/54 (BP Location: Right Arm)   Pulse 93   Temp 97.8 F (36.6 C)   Resp  18   Ht 6' (1.829 m)   Wt 123.7 kg   SpO2 100%   BMI 36.99 kg/m      PHYSICAL EXAM    GENERAL:NAD, no fevers, chills, no weakness no fatigue HEAD: Normocephalic, atraumatic.  EYES: Pupils equal, round, reactive to light. Extraocular muscles intact. No scleral icterus.  MOUTH: Moist mucosal membrane. Dentition intact. No abscess noted.  EAR, NOSE, THROAT: Clear without exudates. No external lesions.  NECK: Supple. No thyromegaly. No nodules. No JVD.  PULMONARY: decreased breath sounds with mild rhonchi worse at bases bilaterally.  CARDIOVASCULAR: S1 and S2. Regular rate and rhythm. No murmurs, rubs, or gallops. No edema. Pedal pulses 2+ bilaterally.  GASTROINTESTINAL: Soft, nontender, nondistended. No masses. Positive bowel sounds. No hepatosplenomegaly.  MUSCULOSKELETAL: No swelling, clubbing, or edema. Range of motion full in all extremities.  NEUROLOGIC: Cranial nerves II through XII are intact. No gross focal neurological deficits. Sensation intact. Reflexes intact.  SKIN: No ulceration, lesions, rashes, or cyanosis. Skin warm and dry. Turgor intact.  PSYCHIATRIC: Mood, affect within normal limits. The patient is awake, alert and oriented x 3. Insight, judgment intact.       IMAGING     ASSESSMENT/PLAN   Acute on chronic hypoxemic respiratory failure Present on admission due to influenza A infection with recurrent left pleural effusion and associated compressive atelectasis -Patient's evaluated by nephrology and is status post Lasix drip as well as pigtail chest tube on the left with 100CC overnight  serosanguineous fluid overnight and active drain during examination today -Is no air leak upon forced aspiratory maneuver, chest tube is not clogged and fluid is freely flowing. -Fluid was previously tested and was noted to be lymphocyte predominant fluid and became exudative upon initiation of diuretics which is known to happen.  Does not appear to be empyema and I do not think he is a very good candidate for intrapleural thrombolytics.  Patient may be heading towards dialysis but if not he may also benefit from thoracic surgery evaluation and possible pleurodesis.     CKD - per nephro    Rutixan plan at some point.    Thank you for allowing me to participate in the care of this patient.   Patient/Family are satisfied with care plan and all questions have been answered.    Provider disclosure: Patient with at least one acute or chronic illness or injury that poses a threat to life or bodily function and is being managed actively during this encounter.  All of the below services have been performed independently by signing provider:  review of prior documentation from internal and or external health records.  Review of previous and current lab results.  Interview and comprehensive assessment during patient visit today. Review of current and previous chest radiographs/CT scans. Discussion of management and test interpretation with health care team and patient/family.   This document was prepared using Dragon voice recognition software and may include unintentional dictation errors.     Ottie Glazier, M.D.  Division of Pulmonary & Critical Care Medicine

## 2022-04-22 NOTE — Care Management Important Message (Signed)
Important Message  Patient Details  Name: [name] MRN: [id number] Date of Birth: 1950-07-23   Medicare Important Message Given:  Yes  Reviewed Medicare IM with patient via room phone [phone]).  Copy of Medicare IM placed in mail to home address on file.    Dannette Barbara 04/22/2022, 11:01 AM

## 2022-04-22 NOTE — Progress Notes (Signed)
Physical Therapy Treatment Patient Details Name: [name] MRN: [id number] DOB: 1950-10-26 Today's Date: 04/22/2022   History of Present Illness Pt is a 72 y.o. male with medical history significant of arthritis, CHF, CAD, DM2, HLD, HTN, treated melanoma, and spinal stenosis who presents for worsening cough, fatigue and SOB. MD assessment includes: Influenza A, hypoxic respiratory failure, hemothorax s/p L chest tube placement, AKI, acute on chronic anemia, hyperglycemia, type 2 NSTEMI related to hemothorax, blood loss, and tachycardia, and edema of the LE.    PT Comments    Pt is progressing with transfers performing sit <>stand and stand pivot with Min guard to Supervision level.  Pt is progressing with gait ambulating 30x2 with RW min guard to supervision level.  1st walk with 2 LO2 donned HR 95bpm, SPO2 100%.  Trialled 2nd walk on RA, HR 105bpm SPO2 93%.  Notified RN of Spo2 and RN recommended pt stay on RA after therapy.  Current PT D/C plan may need to be upgraded as appropriate.   Recommendations for follow up therapy are one component of a multi-disciplinary discharge planning process, led by the attending physician.  Recommendations may be updated based on patient status, additional functional criteria and insurance authorization.  Follow Up Recommendations  Skilled nursing-short term rehab (<3 hours/day) Can patient physically be transported by private vehicle: Yes   Assistance Recommended at Discharge Frequent or constant Supervision/Assistance  Patient can return home with the following A lot of help with bathing/dressing/bathroom;Assistance with cooking/housework;Assist for transportation;Help with stairs or ramp for entrance;A little help with walking and/or transfers   Equipment Recommendations  Other (comment)    Recommendations for Other Services       Precautions / Restrictions Precautions Precautions: Fall Restrictions Weight Bearing Restrictions: No Other  Position/Activity Restrictions: L chest tube     Mobility  Bed Mobility               General bed mobility comments: NT d/t being up in recliner.    Transfers Overall transfer level: Needs assistance Equipment used: Rolling walker (2 wheels) Transfers: Sit to/from Stand, Bed to chair/wheelchair/BSC Sit to Stand: Supervision   Step pivot transfers: Supervision, Min guard       General transfer comment: Min verbal cues for sequencing and posture when turning to sit.    Ambulation/Gait Ambulation/Gait assistance: Supervision, Min guard Gait Distance (Feet): 30 Feet (x2) Assistive device: Rolling walker (2 wheels) Gait Pattern/deviations: Shuffle, Trunk flexed, Step-through pattern Gait velocity: decreased     General Gait Details: pt steady but slow.   Stairs             Wheelchair Mobility    Modified Rankin (Stroke Patients Only)       Balance Overall balance assessment: Needs assistance Sitting-balance support: No upper extremity supported Sitting balance-Leahy Scale: Normal     Standing balance support: Bilateral upper extremity supported, During functional activity, Reliant on assistive device for balance Standing balance-Leahy Scale: Good Standing balance comment: greater activity tolerance in standing: performed marching in place and longer gait distance.                            Cognition Arousal/Alertness: Awake/alert Behavior During Therapy: WFL for tasks assessed/performed Overall Cognitive Status: Within Functional Limits for tasks assessed  Exercises Total Joint Exercises Ankle Circles/Pumps: Strengthening, Both, 10 reps Long Arc Quad: Strengthening, AAROM, Both, 10 reps    General Comments        Pertinent Vitals/Pain Pain Assessment Pain Assessment: No/denies pain    Home Living                          Prior Function            PT  Goals (current goals can now be found in the care plan section) Acute Rehab PT Goals Patient Stated Goal: To get stronger PT Goal Formulation: With patient Time For Goal Achievement: 04/28/22 Potential to Achieve Goals: Good Progress towards PT goals: Progressing toward goals    Frequency    Min 2X/week      PT Plan Current plan remains appropriate    Co-evaluation              AM-PAC PT "6 Clicks" Mobility   Outcome Measure  Help needed turning from your back to your side while in a flat bed without using bedrails?: A Little Help needed moving from lying on your back to sitting on the side of a flat bed without using bedrails?: A Little Help needed moving to and from a bed to a chair (including a wheelchair)?: A Little Help needed standing up from a chair using your arms (e.g., wheelchair or bedside chair)?: A Little Help needed to walk in hospital room?: A Little Help needed climbing 3-5 steps with a railing? : A Lot 6 Click Score: 17    End of Session Equipment Utilized During Treatment: Gait belt Activity Tolerance: Patient tolerated treatment well Patient left: with call bell/phone within reach;in chair;with nursing/sitter in room;with chair alarm set Nurse Communication: Mobility status PT Visit Diagnosis: Unsteadiness on feet (R26.81);Muscle weakness (generalized) (M62.81);Difficulty in walking, not elsewhere classified (R26.2)     Time: 5072-2575 PT Time Calculation (min) (ACUTE ONLY): 28 min  Charges:  $Gait Training: 8-22 mins $Therapeutic Activity: 8-22 mins                     Bjorn Loser, PTA  04/22/22, 12:21 PM

## 2022-04-22 NOTE — Progress Notes (Signed)
[facility]  ROUNDING NOTE   Subjective:  Patient well-known to [place] as we follow him for idiopathic membranous glomerulonephritis with nephrotic syndrome, chronic kidney disease stage II with diabetes mellitus type 2, proteinuria, and lower extremity edema.  Patient came in with significant shortness of breath.  Found to be positive for the influenza A  Patient sitting in chair Continues to have weakness  Remains on 2L [place] Mild lower extremity edema remains  Objective:  Vital signs in last 24 hours:  Temp:  [97.8 F (36.6 C)-98.3 F (36.8 C)] 97.8 F (36.6 C) (02/07 1201) Pulse Rate:  [87-95] 93 (02/07 1201) Resp:  [14-20] 18 (02/07 1201) BP: (112-141)/(54-63) 128/54 (02/07 1201) SpO2:  [94 %-100 %] 100 % (02/07 1201) Weight:  [123.7 kg] 123.7 kg (02/07 0500)  Weight change: -1.1 kg Filed Weights   04/20/22 0500 04/21/22 0414 04/22/22 0500  Weight: 124.2 kg 124.8 kg 123.7 kg    Intake/Output: I/O last 3 completed shifts: In: 1216 [P.O.:1200; I.V.:6] Out: 3070 [Urine:3000; Chest Tube:70]   Intake/Output this shift:  Total I/O In: 480 [P.O.:480] Out: -   Physical Exam: General: NAD  Head: Normocephalic, atraumatic. Moist oral mucosal membranes  Lungs:  Clear, +left chest tube  Heart: regular  Abdomen:  Soft, nontender, bowel sounds present  Extremities: 1+ brawny peripheral edema.  Neurologic: Awake, alert, following commands  Skin: No acute rash    Basic Metabolic Panel: Recent Labs  Lab 04/16/22 0445 04/17/22 0443 04/18/22 0455 04/19/22 0532 04/20/22 0520 04/21/22 0554 04/22/22 0547  NA 137 137 138 141 140 139 139  K 3.5 4.0 3.6 3.6 3.7 4.4 3.9  CL 101 102 103 102 99 98 100  CO2 '26 29 27 31 '$ 32 33* 32  GLUCOSE 114* 176* 134* 233* 215* 202* 232*  BUN 83* 77* 74* 64* 53* 46* 41*  CREATININE 2.08* 2.03* 1.84* 1.68* 1.45* 1.33* 1.29*  CALCIUM 7.7* 7.8* 7.7* 8.0* 8.0* 8.2* 8.0*  MG 2.5* 2.3 2.2  --   --   --   --   PHOS 4.6 3.6 2.9  --   --    --   --      Liver Function Tests: No results for input(s): "AST", "ALT", "ALKPHOS", "BILITOT", "PROT", "ALBUMIN" in the last 168 hours.  No results for input(s): "LIPASE", "AMYLASE" in the last 168 hours. No results for input(s): "AMMONIA" in the last 168 hours.  CBC: Recent Labs  Lab 04/18/22 0455 04/19/22 0532 04/20/22 0520 04/21/22 0554 04/22/22 0547  WBC 5.0 5.2 5.9 6.2 6.9  HGB 7.2* 7.5* 7.8* 8.4* 8.3*  HCT 22.5* 23.9* 24.4* 26.9* 26.2*  MCV 88.9 89.2 88.4 88.5 89.1  PLT 192 212 234 269 269     Cardiac Enzymes: No results for input(s): "CKTOTAL", "CKMB", "CKMBINDEX", "TROPONINI" in the last 168 hours.  BNP: Invalid input(s): "POCBNP"  CBG: Recent Labs  Lab 04/21/22 0723 04/21/22 1554 04/21/22 2008 04/22/22 0818 04/22/22 1102  GLUCAP 192* 251* 281* 207* 57*     Microbiology: Results for orders placed or performed during the hospital encounter of 04/11/22  Resp panel by RT-PCR (RSV, Flu A&B, Covid) Anterior Nasal Swab     Status: Abnormal   Collection Time: 04/11/22  8:34 PM   Specimen: Anterior Nasal Swab  Result Value Ref Range Status   SARS Coronavirus 2 by RT PCR NEGATIVE NEGATIVE Final    Comment: (NOTE) SARS-CoV-2 target nucleic acids are NOT DETECTED.  The SARS-CoV-2 RNA is generally detectable in upper  respiratory specimens during the acute phase of infection. The lowest concentration of SARS-CoV-2 viral copies this assay can detect is 138 copies/mL. A negative result does not preclude SARS-Cov-2 infection and should not be used as the sole basis for treatment or other patient management decisions. A negative result may occur with  improper specimen collection/handling, submission of specimen other than nasopharyngeal swab, presence of viral mutation(s) within the areas targeted by this assay, and inadequate number of viral copies(<138 copies/mL). A negative result must be combined with clinical observations, patient history, and  epidemiological information. The expected result is Negative.  Fact Sheet for Patients:  EntrepreneurPulse.com.au  Fact Sheet for Healthcare Providers:  IncredibleEmployment.be  This test is no t yet approved or cleared by the [place] FDA and  has been authorized for detection and/or diagnosis of SARS-CoV-2 by FDA under an Emergency Use Authorization (EUA). This EUA will remain  in effect (meaning this test can be used) for the duration of the COVID-19 declaration under Section 564(b)(1) of the Act, 21 U.S.C.section 360bbb-3(b)(1), unless the authorization is terminated  or revoked sooner.       Influenza A by PCR POSITIVE (A) NEGATIVE Final   Influenza B by PCR NEGATIVE NEGATIVE Final    Comment: (NOTE) The Xpert Xpress SARS-CoV-2/FLU/RSV plus assay is intended as an aid in the diagnosis of influenza from Nasopharyngeal swab specimens and should not be used as a sole basis for treatment. Nasal washings and aspirates are unacceptable for Xpert Xpress SARS-CoV-2/FLU/RSV testing.  Fact Sheet for Patients: EntrepreneurPulse.com.au  Fact Sheet for Healthcare Providers: IncredibleEmployment.be  This test is not yet approved or cleared by the [place] FDA and has been authorized for detection and/or diagnosis of SARS-CoV-2 by FDA under an Emergency Use Authorization (EUA). This EUA will remain in effect (meaning this test can be used) for the duration of the COVID-19 declaration under Section 564(b)(1) of the Act, 21 U.S.C. section 360bbb-3(b)(1), unless the authorization is terminated or revoked.     Resp Syncytial Virus by PCR NEGATIVE NEGATIVE Final    Comment: (NOTE) Fact Sheet for Patients: EntrepreneurPulse.com.au  Fact Sheet for Healthcare Providers: IncredibleEmployment.be  This test is not yet approved or cleared by the [place] FDA and has  been authorized for detection and/or diagnosis of SARS-CoV-2 by FDA under an Emergency Use Authorization (EUA). This EUA will remain in effect (meaning this test can be used) for the duration of the COVID-19 declaration under Section 564(b)(1) of the Act, 21 U.S.C. section 360bbb-3(b)(1), unless the authorization is terminated or revoked.  Performed at [facility], [address] [place], [place] [address]   Culture, blood (routine x 2)     Status: None   Collection Time: 04/11/22  9:59 PM   Specimen: BLOOD LEFT ARM  Result Value Ref Range Status   Specimen Description BLOOD LEFT ARM  Final   Special Requests   Final    BOTTLES DRAWN AEROBIC AND ANAEROBIC Blood Culture adequate volume   Culture   Final    NO GROWTH 5 DAYS Performed at [facility], [address], [place], [place] [address]    Report Status 04/16/2022 FINAL  Final  Culture, blood (routine x 2)     Status: None   Collection Time: 04/11/22 10:01 PM   Specimen: BLOOD LEFT HAND  Result Value Ref Range Status   Specimen Description BLOOD LEFT HAND  Final   Special Requests   Final    BOTTLES DRAWN AEROBIC AND ANAEROBIC  Blood Culture results may not be optimal due to an inadequate volume of blood received in culture bottles   Culture   Final    NO GROWTH 5 DAYS Performed at [facility], [address] [place],  [address]    Report Status 04/16/2022 FINAL  Final    Coagulation Studies: No results for input(s): "LABPROT", "INR" in the last 72 hours.  Urinalysis: No results for input(s): "COLORURINE", "LABSPEC", "PHURINE", "GLUCOSEU", "HGBUR", "BILIRUBINUR", "KETONESUR", "PROTEINUR", "UROBILINOGEN", "NITRITE", "LEUKOCYTESUR" in the last 72 hours.  Invalid input(s): "APPERANCEUR"    Imaging: No results found.   Medications:    methocarbamol (ROBAXIN) IV      sodium chloride   Intravenous Once   atorvastatin  80 mg Oral Daily   bisacodyl  10 mg Oral QHS    Chlorhexidine Gluconate Cloth  6 each Topical Daily   enoxaparin (LOVENOX) injection  0.5 mg/kg Subcutaneous Q24H   guaiFENesin  600 mg Oral BID   hydrocerin   Topical BID   insulin aspart  0-15 Units Subcutaneous TID WC   insulin aspart  0-5 Units Subcutaneous QHS   insulin aspart  6 Units Subcutaneous TID WC   insulin glargine-yfgn  28 Units Subcutaneous QHS   iron polysaccharides  150 mg Oral Daily   polyethylene glycol  17 g Oral BID   sodium chloride flush  3 mL Intravenous Q12H   spironolactone  50 mg Oral Daily   tacrolimus  1 mg Oral BID   tamsulosin  0.4 mg Oral QPC supper   torsemide  100 mg Oral Daily   Vitamin D (Ergocalciferol)  50,000 Units Oral Q7 days   acetaminophen **OR** acetaminophen, bisacodyl, guaiFENesin-dextromethorphan, ipratropium-albuterol, methocarbamol (ROBAXIN) IV, ondansetron **OR** ondansetron (ZOFRAN) IV, oxyCODONE  Assessment/ Plan:  72 y.o. male with past medical history of diabetes mellitus type 2, morbid obesity, hypertension, coronary artery disease status post stent placement, cardiomyopathy, low testosterone, hyperlipidemia, history of first left toe osteomyelitis with amputation, hydronephrosis, osteoarthritis of the right hip, history of idiopathic membranous glomerulonephritis who presents with increasing shortness of breath.  1.  Idiopathic membranous glomerulonephritis/ Acute kidney injury on chronic kidney disease stage II/proteinuria most recent urine protein to creatinine ratio 4.9.  Patient was in the process of being evaluated for rituximab treatment of his idiopathic membranous glomerulonephritis. -Hold off on administering rituximab at this time.  - Continue Tacrolimus '1mg'$  bid. Tacrolimus level pending - Renal function has returned to baseline, will consider ARB at outpatient follow up.   2. Lower extremity edema. Edema improved. Continue oral diuresis with spironolactone and torsemide.    3.  Anemia of chronic kidney disease.   Hemoglobin 8.3, today.  4.  Acute on chronic combined systolic and diastolic CHF, acute hypoxemic respiratory failure, volume overload, coronary artery disease. Volume management with oral diuresis. Recurrent left-sided pleural effusion secondary to nephrotic syndrome, third spacing of fluid.  Left chest tube in place.  Will follow-up with Dr. Saralyn Pilar at discharge.  5.  Influenza A Completed Tamiflu. Continue supportive care.  We will sign off at this time, feel free to contact us with any further concerns. Will follow up with patient in 1-2 weeks after discharge.    LOS: [place] 2/7/20242:27 PM

## 2022-04-22 NOTE — TOC Progression Note (Signed)
Transition of Care [facility]) - Progression Note    Patient Details  Name: [name] MRN: [id number] Date of Birth: 05-Oct-1950  [facility] CM/SW [place], RN Phone Number: 04/22/2022, 2:17 PM  Clinical Narrative:    Patient's medicare card sent to [facility] 04/21/2022 Spoke with Gena from [facility]. No bed are available at this time. Per [facility] a bed may be available tomorrow.         Expected Discharge Plan and Services                                               Social Determinants of Health (SDOH) Interventions SDOH Screenings   Food Insecurity: No Food Insecurity (04/13/2022)  Housing: Low Risk  (04/13/2022)  Transportation Needs: No Transportation Needs (04/13/2022)  Utilities: Not At Risk (04/13/2022)  Depression (PHQ2-9): Low Risk  (02/27/2022)  Recent Concern: Depression (PHQ2-9) - Medium Risk (02/02/2022)  Tobacco Use: Low Risk  (04/13/2022)    Readmission Risk Interventions     No data to display

## 2022-04-23 ENCOUNTER — Inpatient Hospital Stay: Payer: Medicare HMO

## 2022-04-23 DIAGNOSIS — R918 Other nonspecific abnormal finding of lung field: Secondary | ICD-10-CM

## 2022-04-23 DIAGNOSIS — J101 Influenza due to other identified influenza virus with other respiratory manifestations: Secondary | ICD-10-CM | POA: Diagnosis not present

## 2022-04-23 HISTORY — DX: Other nonspecific abnormal finding of lung field: R91.8

## 2022-04-23 LAB — BASIC METABOLIC PANEL
Anion gap: 7 (ref 5–15)
BUN: 38 mg/dL — ABNORMAL HIGH (ref 8–23)
CO2: 31 mmol/L (ref 22–32)
Calcium: 7.9 mg/dL — ABNORMAL LOW (ref 8.9–10.3)
Chloride: 99 mmol/L (ref 98–111)
Creatinine, Ser: 1.32 mg/dL — ABNORMAL HIGH (ref 0.61–1.24)
GFR, Estimated: 57 mL/min — ABNORMAL LOW (ref >60)
Glucose, Bld: 193 mg/dL — ABNORMAL HIGH (ref 70–99)
Potassium: 3.7 mmol/L (ref 3.5–5.1)
Sodium: 137 mmol/L (ref 135–145)

## 2022-04-23 LAB — CBC
HCT: 25.4 % — ABNORMAL LOW (ref 39.0–52.0)
Hemoglobin: 8 g/dL — ABNORMAL LOW (ref 13.0–17.0)
MCH: 27.7 pg (ref 26.0–34.0)
MCHC: 31.5 g/dL (ref 30.0–36.0)
MCV: 87.9 fL (ref 80.0–100.0)
Platelets: 279 10*3/uL (ref 150–400)
RBC: 2.89 MIL/uL — ABNORMAL LOW (ref 4.22–5.81)
RDW: 15.4 % (ref 11.5–15.5)
WBC: 6.9 10*3/uL (ref 4.0–10.5)
nRBC: 0 % (ref 0.0–0.2)

## 2022-04-23 LAB — MAGNESIUM: Magnesium: 1.5 mg/dL — ABNORMAL LOW (ref 1.7–2.4)

## 2022-04-23 LAB — PHOSPHORUS: Phosphorus: 3 mg/dL (ref 2.5–4.6)

## 2022-04-23 LAB — GLUCOSE, CAPILLARY
Glucose-Capillary: 190 mg/dL — ABNORMAL HIGH (ref 70–99)
Glucose-Capillary: 204 mg/dL — ABNORMAL HIGH (ref 70–99)
Glucose-Capillary: 239 mg/dL — ABNORMAL HIGH (ref 70–99)
Glucose-Capillary: 234 mg/dL — ABNORMAL HIGH (ref 70–99)

## 2022-04-23 MED ORDER — INSULIN ASPART 100 UNIT/ML IJ SOLN
8.0000 [IU] | Freq: Three times a day (TID) | INTRAMUSCULAR | Status: DC
  Administered 2022-04-23 – 2022-04-26 (×11): 8 [IU] via SUBCUTANEOUS
  Filled 2022-04-23 (×11): qty 1

## 2022-04-23 MED ORDER — INSULIN GLARGINE-YFGN 100 UNIT/ML ~~LOC~~ SOLN
34.0000 [IU] | Freq: Every day | SUBCUTANEOUS | Status: DC
  Administered 2022-04-23 – 2022-04-25 (×3): 34 [IU] via SUBCUTANEOUS
  Filled 2022-04-23 (×4): qty 0.34

## 2022-04-23 MED ORDER — MAGNESIUM SULFATE 4 GM/100ML IV SOLN
4.0000 g | Freq: Once | INTRAVENOUS | Status: AC
  Administered 2022-04-23: 4 g via INTRAVENOUS
  Filled 2022-04-23: qty 100

## 2022-04-23 NOTE — Progress Notes (Signed)
Inpatient Diabetes Program Recommendations  AACE/ADA: New Consensus Statement on Inpatient Glycemic Control (2015)  Target Ranges:  Prepandial:   less than 140 mg/dL      [facility] postprandial:   less than 180 mg/dL (1-2 hours)      Critically ill patients:  140 - 180 mg/dL   Lab Results  Component Value Date   GLUCAP 190 (H) 04/23/2022   HGBA1C 5.9 (H) 04/12/2022    Review of Glycemic Control  Latest Reference Range & Units 04/22/22 08:18 04/22/22 11:02 04/22/22 16:21 04/22/22 20:34 04/23/22 08:54  Glucose-Capillary 70 - 99 mg/dL 207 (H) 279 (H) 240 (H) 263 (H) 190 (H)   Diabetes history: DM 2 Outpatient Diabetes medications: Lantus 34 units daily, Novolog 50 units with breakfast, Novolog 60 units with lunch, Glipizide XL 5 mg daily, Jardiance 10 mg daily, Metformin 1000 mg BID  Current orders for Inpatient glycemic control:  Novolog moderate tid with meals and HS Novolog 6 units tid with meals Semglee 28 units q HS  Inpatient Diabetes Program Recommendations:    Consider increasing Semglee to 34 units daily.  Also consider increasing Novolog meal coverage to 8 units tid with meals.   Thanks,  Adah Perl, RN, BC-ADM Inpatient Diabetes Coordinator Pager [phone]  (8a-5p)

## 2022-04-23 NOTE — Progress Notes (Signed)
PULMONOLOGY         Date: 04/23/2022,   MRN# [id number] [name] [birth year]     AdmissionWeight: (!) 138.7 kg                 CurrentWeight: 123.7 kg  Referring provider: Dr Roosevelt Locks   CHIEF COMPLAINT:   Parapneumonic effusion s/p chest tube placement    HISTORY OF PRESENT ILLNESS   This is a pleasant 72 year old male with severe morbid obesity chronic 3+ weeping lower extremity edema due to CKD with Membranous glomerulonephritis disease and recent evaluation with nephrology for rituximab infusion, history of CHF with reduced EF at 40%, osteoarthritis, diabetes, history of melanoma, neuropathy, spinal stenosis with chief complaint of shortness of breath and chest discomfort.  Patient is well-known to our pulmonology service with [facility] clinic we recently saw him for acute on chronic hypoxemic respiratory failure noted to have left-sided large effusion and right-sided mild to moderate effusion.  Status postthoracentesis x 2 with over 1 L drained.  This is a third time that he is shown to have a large pleural effusion in stent 3 months.  He did receive pleural drain upon arrival to the ER with 1500 cc serosanguineous return overnight and active drainage this a.m. and up to 150 cc before lunchtime.  On interview and physical exam patient reports slight improvement mild chest discomfort at pigtail insertion site.  Chest tube management displays no airleak upon forced expiratory maneuver.   04/14/22- patient examined at bedside, s/p repeat CBC with severe anemia requiring PRBC transfusion. S/p [place] abd no ascites. Chest tube in place on left , persistent and recurrent effusion present on CXR 1/28.  Will repeat CXR again today.  On Lasix gtt with 600cc urine overnight. Development of transient hypotension with req for midodrine.   04/15/22-  patient reports nausea. He is on oxycodone.  He appears less swollen today. Overall he is net 1.3L negative and has active fluid per Chest  tube. He needs to start using incentive spirometer today.    04/16/22- patient had 150cc this am per chest tube.  Patient reports improvement clinically.  Continue chest tube for now.   04/17/22- patient on 2L/min nasal canula.  He still has straw colored phlegm returning from chest tube.  He coughed up phlegm with blood/rusty colored. He had obstructiive urophathy and had foley placed feels a lot better now. He is constipated but did have BM today shares he is much improved.  04/18/22- patient is resting in bed.  Still has straw colored return overnight >250cc which will likely continue.  He had cardiology DR [name] come to evaluate him today.   Renal function seems to be improving and I will repeat cxr today for interval effusion changes.  04/19/22- patient aspirated today and was coughing a lot during my evaluation.  Have ordered extra neb treatment.  He is on 2L/min [place].  Overnight >100cc return to chest tube, still unable to come out.  Have ordered physical therapy.   04/20/22- patient with <50cc overnight from chest tube. No air leak noted. He is with nonlabored breathing. He is on 2L/min [place] but using long cordage and is only receiving 1L to nares.  He's urinating well on aldactone 50/demadex 100 daily with Net negative 13L.  Renal function is improved. He's able to ambulate now.   04/21/22- patient is feeling better, he ws able to get up but not walk.  He had another 200cc output from chest tube.  Anemia is improved and renal function has improved, patient making good urine output. He is on PRN dilaudid and oxycodone, I recommend to stop both of those if possible.    04/22/22- patient had further improvement in renal function with FGR now 59, still he is making daily effusion per chest tube >100cc.  Id like to remove pigtail but fluid is actively being produced.  He is walking around and feeling much improved. He is on room air.    04/23/22- patient got up today walked around without dyspnea on room air. He  still is putting out fluid from chest tube.  We did CT chest today and noted a possible tumor that was obscured by overlying fluid.  I have asked pathology to review the pleural fluid in atrium with cell block for atypical cells, there is concern for bronchogenic carcinoma. He had foley removed today.   PAST MEDICAL HISTORY   Past Medical History:  Diagnosis Date   Arthritis    CHF (congestive heart failure), NYHA class II, acute on chronic, systolic (HCC)    EF 16% 0/1093   Coronary artery disease    [facility]   Diabetes mellitus without complication ([place])    Dysplastic nevus 08/26/2020   L flank, moderate atypia   Hyperlipidemia    Hypertension    Melanoma ([place]) 08/20/2015   Right neck. Superficial spreading, arising in nevus. Tumor thickness 1.21m, Anatomic level IV   [place] situ [place] 05/22[place]   R lateral neck ant to scar, exc 05/22/20   Numbness in right leg    due to back   Skin cancer    removed l arm   Spinal stenosis    Stented coronary artery 2000   X 1 STENT   Swelling of both lower extremities      SURGICAL HISTORY   Past Surgical History:  Procedure Laterality Date   CATARACT EXTRACTION W/PHACO Right 05/03/2019   Procedure: CATARACT EXTRACTION PHACO AND INTRAOCULAR LENS PLACEMENT (IOC) RIGHT DIABETIC 6.44 00:58.4 11.0%;  Surgeon: BLeandrew Koyanagi MD;  [place]  Service: Ophthalmology;  Laterality: Right;  DIABETIC   CATARACT EXTRACTION W/PHACO Left 06/28/2019   Procedure: CATARACT EXTRACTION PHACO AND INTRAOCULAR LENS PLACEMENT (IOC) LEFT DIABETIC 7.99  01:13.4  10.9% ;  Surgeon: BLeandrew Koyanagi MD;  [place]  Service: Ophthalmology;  Laterality: Left;  Diabetic - insulin and oral meds   CHOLECYSTECTOMY     JOINT REPLACEMENT     RT KNEE   LUMBAR LAMINECTOMY/DECOMPRESSION MICRODISCECTOMY  04/13/2012   Procedure: LUMBAR LAMINECTOMY/DECOMPRESSION MICRODISCECTOMY 1 LEVEL;  Surgeon:  JJohnn Hai MD;  Location: [facility];  Service: Orthopedics;  Laterality: Bilateral;  L4-L5   UVULECTOMY  1990's     FAMILY HISTORY   History reviewed. No pertinent family history.   SOCIAL HISTORY   Social History   Tobacco Use   Smoking status: Never   Smokeless tobacco: Never  Vaping Use   Vaping Use: Never used  Substance Use Topics   Alcohol use: Yes    Comment: rare   Drug use: No     MEDICATIONS    Home Medication:     Current Medication:  Current Facility-Administered Medications:    0.9 %  sodium chloride infusion (Manually program via Guardrails IV Fluids), , Intravenous, Once, ZSharen Hones MD   acetaminophen (TYLENOL) tablet 650 mg, 650 mg, Oral, Q6H PRN **OR** acetaminophen (TYLENOL) suppository 650 mg, 650 mg, Rectal, Q6H PRN, MSid Falcon MD  atorvastatin (LIPITOR) tablet 80 mg, 80 mg, Oral, Daily, Gilles Chiquito B, MD, 80 mg at 04/23/22 1007   bisacodyl (DULCOLAX) EC tablet 10 mg, 10 mg, Oral, QHS, Val Riles, MD, 10 mg at 04/22/22 2142   bisacodyl (DULCOLAX) suppository 10 mg, 10 mg, Rectal, Daily PRN, Val Riles, MD   Chlorhexidine Gluconate Cloth 2 % PADS 6 each, 6 each, Topical, Daily, Val Riles, MD, 6 each at 04/23/22 1007   enoxaparin (LOVENOX) injection 62.5 mg, 0.5 mg/kg, Subcutaneous, Q24H, Coulter, Carolyn, RPH, 62.5 mg at 04/22/22 2141   guaiFENesin (MUCINEX) 12 hr tablet 600 mg, 600 mg, Oral, BID, Val Riles, MD, 600 mg at 04/23/22 1008   guaiFENesin-dextromethorphan (ROBITUSSIN DM) 100-10 MG/5ML syrup 5 mL, 5 mL, Oral, Q4H PRN, Sid Falcon, MD, 5 mL at 04/13/22 2154   hydrocerin (EUCERIN) cream, , Topical, BID, Val Riles, MD, Given at 04/23/22 1009   insulin aspart (novoLOG) injection 0-15 Units, 0-15 Units, Subcutaneous, TID WC, Val Riles, MD, 3 Units at 04/23/22 0859   insulin aspart (novoLOG) injection 0-5 Units, 0-5 Units, Subcutaneous, QHS, Val Riles, MD, 3 Units at 04/22/22 2142   insulin aspart  (novoLOG) injection 8 Units, 8 Units, Subcutaneous, TID WC, Val Riles, MD   insulin glargine-yfgn (SEMGLEE) injection 34 Units, 34 Units, Subcutaneous, QHS, Dwyane Dee, Dileep, MD   ipratropium-albuterol (DUONEB) 0.5-2.5 (3) MG/3ML nebulizer solution 3 mL, 3 mL, Nebulization, Q4H PRN, Val Riles, MD   iron polysaccharides (NIFEREX) capsule 150 mg, 150 mg, Oral, Daily, Val Riles, MD, 150 mg at 04/23/22 1008   methocarbamol (ROBAXIN) 500 mg in dextrose 5 % 50 mL IVPB, 500 mg, Intravenous, Q6H PRN, Sid Falcon, MD   ondansetron (ZOFRAN) tablet 4 mg, 4 mg, Oral, Q6H PRN **OR** ondansetron (ZOFRAN) injection 4 mg, 4 mg, Intravenous, Q6H PRN, Gilles Chiquito B, MD, 4 mg at 04/23/22 1203   oxyCODONE (Oxy IR/ROXICODONE) immediate release tablet 5 mg, 5 mg, Oral, Q6H PRN, Val Riles, MD   polyethylene glycol (MIRALAX / GLYCOLAX) packet 17 g, 17 g, Oral, BID, Val Riles, MD, 17 g at 04/22/22 2142   sodium chloride flush (NS) 0.9 % injection 3 mL, 3 mL, Intravenous, Q12H, Gilles Chiquito B, MD, 3 mL at 04/23/22 1009   spironolactone (ALDACTONE) tablet 50 mg, 50 mg, Oral, Daily, Breeze, Shantelle, NP, 50 mg at 04/23/22 1007   tacrolimus (PROGRAF) capsule 1 mg, 1 mg, Oral, BID, Candiss Norse, Har[place], MD, 1 mg at 04/23/22 1008   tamsulosin (FLOMAX) capsule 0.4 mg, 0.4 mg, Oral, QPC supper, Val Riles, MD, 0.4 mg at 04/22/22 1822   torsemide (DEMADEX) tablet 100 mg, 100 mg, Oral, Daily, Breeze, Shantelle, NP, 100 mg at 04/23/22 1007   Vitamin D (Ergocalciferol) (DRISDOL) 1.25 MG (50000 UNIT) capsule 50,000 Units, 50,000 Units, Oral, Q7 days, Val Riles, MD, 50,000 Units at 04/17/22 1228    ALLERGIES   Carvedilol, Metoprolol, Toprol xl [metoprolol tartrate], Buprenorphine, Buprenorphine hcl, Morphine, Morphine and related, Triamcinolone, and Triamcinolone acetonide     REVIEW OF SYSTEMS    Review of Systems:  Gen:  Denies  fever, sweats, chills weigh loss  HEENT: Denies blurred vision,  double vision, ear pain, eye pain, hearing loss, nose bleeds, sore throat Cardiac:  No dizziness, chest pain or heaviness, chest tightness,edema Resp:   reports dyspnea chronically  Gi: Denies swallowing difficulty, stomach pain, nausea or vomiting, diarrhea, constipation, bowel incontinence Gu:  Denies bladder incontinence, burning urine Ext:   Denies Joint pain, stiffness or swelling Skin: Denies  skin rash, easy bruising or bleeding or hives Endoc:  Denies polyuria, polydipsia , polyphagia or weight change Psych:   Denies depression, insomnia or hallucinations   Other:  All other systems negative   VS: BP (!) 120/50 (BP Location: Right Arm)   Pulse 89   Temp 98.2 F (36.8 C) (Oral)   Resp 18   Ht 6' (1.829 m)   Wt 123.7 kg   SpO2 96%   BMI 36.99 kg/m      PHYSICAL EXAM    GENERAL:NAD, no fevers, chills, no weakness no fatigue HEAD: Normocephalic, atraumatic.  EYES: Pupils equal, round, reactive to light. Extraocular muscles intact. No scleral icterus.  MOUTH: Moist mucosal membrane. Dentition intact. No abscess noted.  EAR, NOSE, THROAT: Clear without exudates. No external lesions.  NECK: Supple. No thyromegaly. No nodules. No JVD.  PULMONARY: decreased breath sounds with mild rhonchi worse at bases bilaterally.  CARDIOVASCULAR: S1 and S2. Regular rate and rhythm. No murmurs, rubs, or gallops. No edema. Pedal pulses 2+ bilaterally.  GASTROINTESTINAL: Soft, nontender, nondistended. No masses. Positive bowel sounds. No hepatosplenomegaly.  MUSCULOSKELETAL: No swelling, clubbing, or edema. Range of motion full in all extremities.  NEUROLOGIC: Cranial nerves II through XII are intact. No gross focal neurological deficits. Sensation intact. Reflexes intact.  SKIN: No ulceration, lesions, rashes, or cyanosis. Skin warm and dry. Turgor intact.  PSYCHIATRIC: Mood, affect within normal limits. The patient is awake, alert and oriented x 3. Insight, judgment intact.        IMAGING     ASSESSMENT/PLAN   Acute on chronic hypoxemic respiratory failure Present on admission due to influenza A infection with recurrent left pleural effusion and associated compressive atelectasis -Patient's evaluated by nephrology and is status post Lasix drip as well as pigtail chest tube on the left with 100CC overnight serosanguineous fluid overnight and active drain during examination today -Is no air leak upon forced aspiratory maneuver, chest tube is not clogged and fluid is freely flowing. -Fluid was previously tested and was noted to be lymphocyte predominant fluid and became exudative upon initiation of diuretics which is known to happen.  Does not appear to be empyema and I do not think he is a very good candidate for intrapleural thrombolytics.  Patient may be heading towards dialysis but if not he may also benefit from thoracic surgery evaluation and possible pleurodesis.     CKD - per nephro    Rutixan plan at some point.    Thank you for allowing me to participate in the care of this patient.   Patient/Family are satisfied with care plan and all questions have been answered.    Provider disclosure: Patient with at least one acute or chronic illness or injury that poses a threat to life or bodily function and is being managed actively during this encounter.  All of the below services have been performed independently by signing provider:  review of prior documentation from internal and or external health records.  Review of previous and current lab results.  Interview and comprehensive assessment during patient visit today. Review of current and previous chest radiographs/CT scans. Discussion of management and test interpretation with health care team and patient/family.   This document was prepared using Dragon voice recognition software and may include unintentional dictation errors.     Ottie Glazier, M.D.  Division of Pulmonary & Critical Care Medicine

## 2022-04-23 NOTE — Progress Notes (Signed)
Occupational Therapy Treatment Patient Details Name: [name] MRN: [id number] DOB: [birth year] Today's Date: 04/23/2022   History of present illness Pt is a 72 y.o. male with medical history significant of arthritis, CHF, CAD, DM2, HLD, HTN, treated melanoma, and spinal stenosis who presents for worsening cough, fatigue and SOB. MD assessment includes: Influenza A, hypoxic respiratory failure, hemothorax s/p L chest tube placement, AKI, acute on chronic anemia, hyperglycemia, type 2 NSTEMI related to hemothorax, blood loss, and tachycardia, and edema of the LE.   OT comments  Upon entering the room, pt supine in bed and agreeable to OT intervention with encouragement. Pt performs bed mobility with supervision and stands with min guard and cuing for hand placement. Pt ambulates ~ 20' to sink with RW and min guard progressing to supervision. Pt stands for ~ 4 minutes to brush teeth with close supervision and then reports need to return to chair secondary to fatigue. Pt returns to chair and sits in recliner with all needs within reach. Pt making progress towards goals but fatigues quickly during session. Chair alarm activated and call bell within reach.    Recommendations for follow up therapy are one component of a multi-disciplinary discharge planning process, led by the attending physician.  Recommendations may be updated based on patient status, additional functional criteria and insurance authorization.    Follow Up Recommendations  Skilled nursing-short term rehab (<3 hours/day)     Assistance Recommended at Discharge Frequent or constant Supervision/Assistance  Patient can return home with the following  A lot of help with bathing/dressing/bathroom;Assistance with cooking/housework;Assist for transportation;Help with stairs or ramp for entrance;A little help with walking and/or transfers   Equipment Recommendations  Other (comment) (defer to next venue of care)       Precautions /  Restrictions Precautions Precautions: Fall Restrictions Weight Bearing Restrictions: No Other Position/Activity Restrictions: L chest tube       Mobility Bed Mobility Overal bed mobility: Needs Assistance Bed Mobility: Supine to Sit     Supine to sit: Supervision          Transfers Overall transfer level: Needs assistance Equipment used: Rolling walker (2 wheels) Transfers: Sit to/from Stand, Bed to chair/wheelchair/BSC Sit to Stand: Supervision     Step pivot transfers: Supervision, Min guard           Balance Overall balance assessment: Needs assistance Sitting-balance support: No upper extremity supported Sitting balance-Leahy Scale: Normal     Standing balance support: Bilateral upper extremity supported, During functional activity, Reliant on assistive device for balance Standing balance-Leahy Scale: Good                             ADL either performed or assessed with clinical judgement   ADL Overall ADL's : Needs assistance/impaired     Grooming: Standing;Oral care;Supervision/safety                                      Extremity/Trunk Assessment Upper Extremity Assessment Upper Extremity Assessment: Generalized weakness   Lower Extremity Assessment Lower Extremity Assessment: Generalized weakness        Vision Patient Visual Report: No change from baseline            Cognition Arousal/Alertness: Awake/alert Behavior During Therapy: WFL for tasks assessed/performed Overall Cognitive Status: Within Functional Limits for tasks assessed  Pertinent Vitals/ Pain       Pain Assessment Pain Assessment: No/denies pain         Frequency  Min 2X/week        Progress Toward Goals  OT Goals(current goals can now be found in the care plan section)  Progress towards OT goals: Progressing toward goals  Acute Rehab OT Goals Patient  Stated Goal: to get stronger OT Goal Formulation: With patient Time For Goal Achievement: 04/29/22 Potential to Achieve Goals: Good  Plan Discharge plan remains appropriate;Frequency remains appropriate       AM-PAC OT "6 Clicks" Daily Activity     Outcome Measure   Help from another person eating meals?: None Help from another person taking care of personal grooming?: A Little Help from another person toileting, which includes using toliet, bedpan, or urinal?: A Lot Help from another person bathing (including washing, rinsing, drying)?: A Lot Help from another person to put on and taking off regular upper body clothing?: A Little Help from another person to put on and taking off regular lower body clothing?: A Lot 6 Click Score: 16    End of Session Equipment Utilized During Treatment: Rolling walker (2 wheels)  OT Visit Diagnosis: Other abnormalities of gait and mobility (R26.89);Muscle weakness (generalized) (M62.81)   Activity Tolerance Patient tolerated treatment well   Patient Left in chair;with call bell/phone within reach   Nurse Communication Mobility status        Time: 6599-3570 OT Time Calculation (min): 16 min  Charges: OT General Charges $OT Visit: 1 Visit OT Treatments $Self Care/Home Management : 8-22 mins  Darleen Crocker, MS, OTR/L , CBIS ascom [phone]  04/23/22, 1:13 PM

## 2022-04-23 NOTE — Progress Notes (Addendum)
[facility] Progress Note  Patient: [name]    GXQ:[id number]  DOA: 04/11/2022     Date of Service: the patient was seen and examined on 04/23/2022  Chief Complaint  Patient presents with   Shortness of Breath   Brief hospital course: [name] is a 72 y.o. male with PMH of arthritis, CHF (chronic), CAD, DM2, HLD, HTN, h/o treated melanoma, spinal stenosis who presents for worsening cough, fatigue and sob. Had low-grade fever, he was found to have positive for influenza A. He had recurrent left-sided pleural effusion, had a thoracentesis x 2 during the last months.  Upon arrival emergency room, chest x-ray showed large left-sided pleural effusion again, chest tube was placed by ED physician. Patient also developed significant hypoxemia with oxygen saturation as low as 76% on room air this morning.  Treated with oxygen. Patient has severe volume overload with evidence of bichamber heart failure.  Echocardiogram showed EF 45-50% with grade 2 diastolic dysfunction. Pulm valve not visualized.  Furosemide drip started after seen by nephrology.   1/29.  Patient had minimal urine after Lasix, but chest tube had drained additional 1500 mL in 24 hours.    Assessment and Plan: Acute on chronic combined systolic and diastolic congestive heart failure Acute hypoxemic respiratory failure secondary to volume overload. Elevated troponin with NSTEMI. Coronary artery disease. Continue supplemental O2 nation and gradually wean off Biventricular heart failure, significant volume overload, edema of bilateral lower extremities, elevated BNP Abdominal ultrasound did not show any ascites. Nephro and cardiology consulted S/p Lasix IV infusion, discontinued on 2/2, started torsemide 100 mg p.o. daily and Aldactone 50 mg p.o. daily Patient was on valsartan 150 mg p.o. daily at home, cardiology recommended to restart as BP allows.  Patient has intolerance to metoprolol and Coreg (itching).  Cardio  signed off. Good urine output after Foley catheter insertion   Recurrent left side pleural effusion.  Chest tube was placed in the emergency room, removed 2 L of serous fluids.  No evidence of bleeding.  Based on prior lab test, cytology was negative.  Prior test also does not indicate infection.  Reviewed chest x-ray post chest tube, no evidence of pneumonia. Procalcitonin level not significant elevated at 0.2 with chronic renal function.  No evidence of infection.  Patient is followed by pulmonology, patient appears to have rapid accumulation of pleural fluid.  Had a discussion with CT surgery, pulmonology and nephrology.  Patient still has a large amount of the drain from the chest tube, patient cannot be discharged with chest tube in place.  The source of increased pleural effusion is due to heart failure, third spacing from renal disease.  s/p IV albumin and diuretics.  2/3  CXR  Stable position of left chest tube. No pneumothorax. No change in aeration to the left lower lung. 2/8 CT chest: Small anterior left pneumothorax, chest tube terminates at lingula and left heart border, needs repositioning of the tube.  LLL Mass Suspicious for neoplasm, needs PET scan versus biopsy.  Increased density possible aspiration of barium.  Nodular appearing pleural surface possible malignancy.  Pulmonologist is aware, contacted IR for replacement of chest.  And also involved oncologist.   Influenza A. S/p Tamiflu for 5 days, last dose on 04/16/22 2/1 started Mucinex 600 mg p.o. twice daily due to chest congestion   Acute kidney injury on chronic kidney disease stage II. Idiopathic membranous glomerulonephritis. Hyponatremia. Patient has a worsening renal function on chronic renal disease.  He is grossly  volume overloaded, he is started on Lasix drip.  Renal function is gradually getting worse.  There is a possibility that patient may need a dialysis. Nephrology is working with oncology to start rituximab.   2/4 started tacrolimus 1 mg p.o. twice daily until patient is able to take rituximab 2/8 Creatinine 1.32 stable    Hypomagnesemia, mag repleted.   Monitor and replete as needed.  Iron deficient anemia. Acute blood loss anemia ruled out. Patient received IV iron, hemoglobin dropped down to 6.9, chest tube fluids does not seem to be bloody.  s/p 1 unit PRBC.  No evidence of GI bleed. Hb 7.2 --7.5--7.8--8.3--8.0 stable Continue to monitor H&H and transfuse if hemoglobin less than 7   Uncontrolled type 2 diabetes with hyperglycemia Continue long-acting insulin, sliding scale insulin.   Essential hypertension Continue to monitor.   Morbid obesity BMI 41.47, Likely obstructive sleep apnea. Diet exercise. Will decide if patient need BiPAP before discharge.   Urinary retention, Foley catheter was inserted on 04/16/2022 Started Flomax Continue Foley catheter for 1 week and then DC when patient is more ambulatory  Vitamin D deficiency: started vitamin D 50,000 units p.o. weekly, follow with PCP to repeat vitamin D level after 3 to 6 months.   Body mass index is 41.47 kg/m.  Interventions:       Diet: Carb modified diet DVT Prophylaxis: Subcutaneous Lovenox   Advance goals of care discussion: Full code  Family Communication: family was not present at bedside, at the time of interview.  The pt provided permission to discuss medical plan with the family. Opportunity was given to ask question and all questions were answered satisfactorily.   Disposition:  Pt is from Home, admitted with anasarca and left pleural effusion, still has left-sided chest tube, which precludes a safe discharge. Discharge to SNF, when clinically stable, needs pulmonary clearance.  Subjective: No significant events overnight, patient was sitting comfortably on the recliner, shortness of breath and edema is getting better, cough is improving.  Denies any active issues. Came back from chest CT scan, report  was given to the patient regarding suspicious lung mass and may need chest tube replacement. Further information will be given by pulmonologist regarding workup of malignancy and management plan   Physical Exam: General: NAD, lying comfortably Appear in no distress, affect appropriate Eyes: PERRLA ENT: Oral Mucosa Clear, moist  Neck: no JVD,  Cardiovascular: S1 and S2 Present, no Murmur,  Respiratory: good respiratory effort, Bilateral Air entry equal, mild bibasilar crackles, no wheezing, left sided chest tube intact Abdomen: Bowel Sound present, Soft and no tenderness,  Skin: no rashes Extremities: 2+ Pedal edema, no calf tenderness.  Edema is gradually improving, dryness improving after Eucerin cream application. Neurologic: without any new focal findings Gait not checked due to patient safety concerns  Vitals:   04/22/22 1625 04/22/22 2032 04/23/22 0346 04/23/22 0854  BP: (!) 120/58 122/84 134/66 (!) 120/50  Pulse: 91 93 88 89  Resp: '18 20 20 18  '$ Temp: 97.9 F (36.6 C) (!) 97 F (36.1 C) 98.5 F (36.9 C) 98.2 F (36.8 C)  TempSrc:  Oral Oral Oral  SpO2: 100% 98% 96% 96%  Weight:      Height:        Intake/Output Summary (Last 24 hours) at 04/23/2022 1147 Last data filed at 04/23/2022 0600 Gross per 24 hour  Intake 840 ml  Output 1115 ml  Net -275 ml   Filed Weights   04/20/22 0500 04/21/22 0414 04/22/22 0500  Weight: 124.2 kg 124.8 kg 123.7 kg    Data Reviewed: I have personally reviewed and interpreted daily labs, tele strips, imagings as discussed above. I reviewed all nursing notes, pharmacy notes, vitals, pertinent old records I have discussed plan of care as described above with RN and patient/family.  CBC: Recent Labs  Lab 04/19/22 0532 04/20/22 0520 04/21/22 0554 04/22/22 0547 04/23/22 0226  WBC 5.2 5.9 6.2 6.9 6.9  HGB 7.5* 7.8* 8.4* 8.3* 8.0*  HCT 23.9* 24.4* 26.9* 26.2* 25.4*  MCV 89.2 88.4 88.5 89.1 87.9  PLT 212 234 269 269 124   Basic  Metabolic Panel: Recent Labs  Lab 04/17/22 0443 04/18/22 0455 04/19/22 0532 04/20/22 0520 04/21/22 0554 04/22/22 0547 04/23/22 0226  NA 137 138 141 140 139 139 137  K 4.0 3.6 3.6 3.7 4.4 3.9 3.7  CL 102 103 102 99 98 100 99  CO2 '29 27 31 '$ 32 33* 32 31  GLUCOSE 176* 134* 233* 215* 202* 232* 193*  BUN 77* 74* 64* 53* 46* 41* 38*  CREATININE 2.03* 1.84* 1.68* 1.45* 1.33* 1.29* 1.32*  CALCIUM 7.8* 7.7* 8.0* 8.0* 8.2* 8.0* 7.9*  MG 2.3 2.2  --   --   --   --  1.5*  PHOS 3.6 2.9  --   --   --   --  3.0    Studies: CT CHEST WO CONTRAST  Result Date: 04/23/2022 CLINICAL DATA:  April 11, 2022 CT of the chest. * Tracking Code: BO * EXAM: CT CHEST WITHOUT CONTRAST TECHNIQUE: Multidetector CT imaging of the chest was performed following the standard protocol without IV contrast. RADIATION DOSE REDUCTION: This exam was performed according to the departmental dose-optimization program which includes automated exposure control, adjustment of the mA and/or kV according to patient size and/or use of iterative reconstruction technique. COMPARISON:  April 11, 2018 for FINDINGS: Cardiovascular: Three-vessel coronary artery disease with extensive coronary calcification. Heart size is enlarged with low-attenuation in cardiac blood pool compatible with anemia. Central pulmonary vasculature is normal caliber. Aorta with scattered atherosclerosis. Limited assessment of cardiovascular structures given lack of intravenous contrast. Mediastinum/Nodes: No signs of adenopathy in the chest with scattered small lymph nodes throughout the chest. No retrocrural adenopathy. Lungs/Pleura: LEFT-sided chest tube in place. Small anterior LEFT pneumothorax. After marked reduction in pleural fluid there is a LEFT lower lobe mass present measuring 6.5 x 4.8 cm (image 107/3) peripheral consolidative changes are noted in the dependent LEFT chest though there is improved aeration throughout the LEFT lower lobe. Chest tube terminates  along the posterior margin of the LEFT heart in the medial LEFT chest just anterior to the LEFT infrahilar region tracking between the lingula and the heart border. Soft tissue nodularity along the pleural surface is evident following reduction in pleural fluid as well is unclear whether this is complex fluid or true soft tissue (image 112/2 area measuring 4.6 x 2.0 cm is contiguous with pleural fluid elsewhere in the chest which is now limited mainly to the dependent posterior LEFT chest. Increased density persists along the margin of collapsed lung dependent LEFT pleural space. Tiny juxta fissural nodule in the RIGHT chest (image 81/2) 5 mm along the minor fissure is nonspecific. Large airways are patent. Upper Abdomen: Incidental imaging of upper abdominal contents show no acute process select showed no acute process shows no acute process. Post cholecystectomy. Musculoskeletal: Subcutaneous emphysema along the LEFT chest wall. No acute bone finding. No destructive bone process. Spinal degenerative changes. IMPRESSION: 1. LEFT-sided chest  tube in place. Small anterior LEFT pneumothorax. Chest tube terminates between the lingula and LEFT heart border, tip just anterior to infrahilar region on the LEFT. Correlate with tube function and with desired positioning with repositioning as needed. 2. LEFT lower lobe mass suspicious for pulmonary neoplasm with peripheral postobstructive changes or ill-defined tumor tracking peripheral to the mass. Based on large size PET and/or biopsy is suggested. 3. Increased density in the inferior and medial LEFT pleural space is unchanged and may reflect sequela of prior aspiration of barium are similar process, calcification in this area is also considered. There is slightly more dense nodular appearing pleural surface along the lateral LEFT chest that could be indicative of malignant effusion. 4. Improved aeration throughout the LEFT lower lobe. 5. Tiny juxta fissural nodule in the  RIGHT chest is nonspecific. Attention on follow-up. 6. Three-vessel coronary artery disease with extensive coronary calcification. 7. Aortic atherosclerosis. Aortic Atherosclerosis (ICD10-I70.0). Electronically Signed   By: Zetta Bills M.D.   On: 04/23/2022 09:54   DG Chest Port 1 View  Result Date: 04/22/2022 CLINICAL DATA:  Pleural effusion EXAM: PORTABLE CHEST 1 VIEW COMPARISON:  04/18/2022, CT 04/11/2022, 04/14/2022 FINDINGS: Left chest tube minimally retracted compared to prior. Slightly improved aeration of left lung base with probable decreased left effusion. Enlarged cardiomediastinal silhouette with aortic atherosclerosis. No convincing pneumothorax IMPRESSION: Left chest tube minimally retracted compared to prior. Slightly improved aeration of left lung base with probable decreased left effusion. Electronically Signed   By: Donavan Foil M.D.   On: 04/22/2022 15:28    Scheduled Meds:  sodium chloride   Intravenous Once   atorvastatin  80 mg Oral Daily   bisacodyl  10 mg Oral QHS   Chlorhexidine Gluconate Cloth  6 each Topical Daily   enoxaparin (LOVENOX) injection  0.5 mg/kg Subcutaneous Q24H   guaiFENesin  600 mg Oral BID   hydrocerin   Topical BID   insulin aspart  0-15 Units Subcutaneous TID WC   insulin aspart  0-5 Units Subcutaneous QHS   insulin aspart  8 Units Subcutaneous TID WC   insulin glargine-yfgn  34 Units Subcutaneous QHS   iron polysaccharides  150 mg Oral Daily   polyethylene glycol  17 g Oral BID   sodium chloride flush  3 mL Intravenous Q12H   spironolactone  50 mg Oral Daily   tacrolimus  1 mg Oral BID   tamsulosin  0.4 mg Oral QPC supper   torsemide  100 mg Oral Daily   Vitamin D (Ergocalciferol)  50,000 Units Oral Q7 days   Continuous Infusions:  magnesium sulfate bolus IVPB 4 g (04/23/22 1017)   methocarbamol (ROBAXIN) IV     PRN Meds: acetaminophen **OR** acetaminophen, bisacodyl, guaiFENesin-dextromethorphan, ipratropium-albuterol, methocarbamol  (ROBAXIN) IV, ondansetron **OR** ondansetron (ZOFRAN) IV, oxyCODONE  Time spent: 35 minutes  Author: Val Riles. MD [facility] 04/23/2022 11:47 AM  To reach On-call, see care teams to locate the attending and reach out to them via [email or web address]. If 7PM-7AM, please contact night-coverage If you still have difficulty reaching the attending provider, please page the [facility] (Director on Call) for [facility] on amion for assistance.

## 2022-04-24 DIAGNOSIS — J90 Pleural effusion, not elsewhere classified: Secondary | ICD-10-CM | POA: Diagnosis not present

## 2022-04-24 LAB — BASIC METABOLIC PANEL
Anion gap: 8 (ref 5–15)
BUN: 41 mg/dL — ABNORMAL HIGH (ref 8–23)
CO2: 30 mmol/L (ref 22–32)
Calcium: 7.8 mg/dL — ABNORMAL LOW (ref 8.9–10.3)
Chloride: 97 mmol/L — ABNORMAL LOW (ref 98–111)
Creatinine, Ser: 1.56 mg/dL — ABNORMAL HIGH (ref 0.61–1.24)
GFR, Estimated: 47 mL/min — ABNORMAL LOW (ref >60)
Glucose, Bld: 162 mg/dL — ABNORMAL HIGH (ref 70–99)
Potassium: 3.9 mmol/L (ref 3.5–5.1)
Sodium: 135 mmol/L (ref 135–145)

## 2022-04-24 LAB — CBC
HCT: 24.5 % — ABNORMAL LOW (ref 39.0–52.0)
Hemoglobin: 7.9 g/dL — ABNORMAL LOW (ref 13.0–17.0)
MCH: 28.2 pg (ref 26.0–34.0)
MCHC: 32.2 g/dL (ref 30.0–36.0)
MCV: 87.5 fL (ref 80.0–100.0)
Platelets: 275 10*3/uL (ref 150–400)
RBC: 2.8 MIL/uL — ABNORMAL LOW (ref 4.22–5.81)
RDW: 15.4 % (ref 11.5–15.5)
WBC: 7.1 10*3/uL (ref 4.0–10.5)
nRBC: 0 % (ref 0.0–0.2)

## 2022-04-24 LAB — MISC LABCORP TEST (SEND OUT): Labcorp test code: 9985

## 2022-04-24 LAB — TACROLIMUS LEVEL: Tacrolimus (FK506) - LabCorp: 4 ng/mL (ref 2.0–20.0)

## 2022-04-24 LAB — GLUCOSE, CAPILLARY
Glucose-Capillary: 162 mg/dL — ABNORMAL HIGH (ref 70–99)
Glucose-Capillary: 173 mg/dL — ABNORMAL HIGH (ref 70–99)
Glucose-Capillary: 246 mg/dL — ABNORMAL HIGH (ref 70–99)
Glucose-Capillary: 272 mg/dL — ABNORMAL HIGH (ref 70–99)

## 2022-04-24 LAB — MAGNESIUM: Magnesium: 2.1 mg/dL (ref 1.7–2.4)

## 2022-04-24 LAB — PHOSPHORUS: Phosphorus: 3.5 mg/dL (ref 2.5–4.6)

## 2022-04-24 LAB — CYTOLOGY - NON PAP

## 2022-04-24 NOTE — Progress Notes (Signed)
[facility] Progress Note  Patient: [name]    [id number]  DOA: 04/11/2022     Date of Service: the patient was seen and examined on 04/24/2022  Chief Complaint  Patient presents with   Shortness of Breath   Brief hospital course: [name] is a 72 y.o. male with PMH of arthritis, CHF (chronic), CAD, DM2, HLD, HTN, h/o treated melanoma, spinal stenosis who presents for worsening cough, fatigue and sob. Had low-grade fever, he was found to have positive for influenza A. He had recurrent left-sided pleural effusion, had a thoracentesis x 2 during the last months.  Upon arrival emergency room, chest x-ray showed large left-sided pleural effusion again, chest tube was placed by ED physician. Patient also developed significant hypoxemia with oxygen saturation as low as 76% on room air this morning.  Treated with oxygen. Patient has severe volume overload with evidence of bichamber heart failure.  Echocardiogram showed EF 45-50% with grade 2 diastolic dysfunction. Pulm valve not visualized.  Furosemide drip started after seen by nephrology.   1/29.  Patient had minimal urine after Lasix, but chest tube had drained additional 1500 mL in 24 hours.    Assessment and Plan: Acute on chronic combined systolic and diastolic congestive heart failure Acute hypoxemic respiratory failure secondary to volume overload. Elevated troponin with NSTEMI. Coronary artery disease. Continue supplemental O2 nation and gradually wean off Biventricular heart failure, significant volume overload, edema of bilateral lower extremities, elevated BNP Abdominal ultrasound did not show any ascites. Nephro and cardiology consulted S/p Lasix IV infusion, discontinued on 2/2, started torsemide 100 mg p.o. daily and Aldactone 50 mg p.o. daily Patient was on valsartan 150 mg p.o. daily at home, cardiology recommended to restart as BP allows.  Patient has intolerance to metoprolol and Coreg (itching).  Cardio  signed off. Good urine output after Foley catheter insertion   Recurrent left side pleural effusion.  Chest tube was placed in the emergency room, removed 2 L of serous fluids.  No evidence of bleeding.  Based on prior lab test, cytology was negative.  Prior test also does not indicate infection.  Reviewed chest x-ray post chest tube, no evidence of pneumonia. Procalcitonin level not significant elevated at 0.2 with chronic renal function.  No evidence of infection.  Patient is followed by pulmonology, patient appears to have rapid accumulation of pleural fluid.  Had a discussion with CT surgery, pulmonology and nephrology.  Patient still has a large amount of the drain from the chest tube, patient cannot be discharged with chest tube in place.  The source of increased pleural effusion is due to heart failure, third spacing from renal disease.  s/p IV albumin and diuretics.  2/3  CXR  Stable position of left chest tube. No pneumothorax. No change in aeration to the left lower lung. 2/8 CT chest: Small anterior left pneumothorax, chest tube terminates at lingula and left heart border, needs repositioning of the tube.  LLL Mass Suspicious for neoplasm, needs PET scan versus biopsy.  Increased density possible aspiration of barium.  Nodular appearing pleural surface possible malignancy.  Pulmonologist is aware.  Pleural fluid was sent again for cytology which is negative for any malignancy.  2/9 d/w pulm, patient will need lung biopsy    Influenza A. S/p Tamiflu for 5 days, last dose on 04/16/22 2/1 started Mucinex 600 mg p.o. twice daily due to chest congestion   Acute kidney injury on chronic kidney disease stage II. Idiopathic membranous glomerulonephritis. Hyponatremia. Patient  has a worsening renal function on chronic renal disease.  He is grossly volume overloaded, he is started on Lasix drip.  Renal function is gradually getting worse.  There is a possibility that patient may need a  dialysis. Nephrology is working with oncology to start rituximab.  2/4 started tacrolimus 1 mg p.o. twice daily until patient is able to take rituximab 2/9 Creatinine 1.56 slightly elevated, continue to monitor   Hypomagnesemia, mag repleted.   Monitor and replete as needed.  Iron deficient anemia. Acute blood loss anemia ruled out. Patient received IV iron, hemoglobin dropped down to 6.9, chest tube fluids does not seem to be bloody.  s/p 1 unit PRBC.  No evidence of GI bleed. Hb 7.2 --7.5--7.8--8.3--8.0 stable Continue to monitor H&H and transfuse if hemoglobin less than 7   Uncontrolled type 2 diabetes with hyperglycemia Continue long-acting insulin, sliding scale insulin.   Essential hypertension Continue to monitor.   Morbid obesity BMI 41.47, Likely obstructive sleep apnea. Diet exercise. Will decide if patient need BiPAP before discharge.   Urinary retention, Foley catheter was inserted on 04/16/2022 Started Flomax Continued Foley catheter for 1 week, DC'd Foley on 04/24/2022, follow voiding trial  Bladder scan as needed   Vitamin D deficiency: started vitamin D 50,000 units p.o. weekly, follow with PCP to repeat vitamin D level after 3 to 6 months.   Body mass index is 41.47 kg/m.  Interventions:       Diet: Carb modified diet DVT Prophylaxis: Subcutaneous Lovenox   Advance goals of care discussion: Full code  Family Communication: family was not present at bedside, at the time of interview.  The pt provided permission to discuss medical plan with the family. Opportunity was given to ask question and all questions were answered satisfactorily.   Disposition:  Pt is from Home, admitted with anasarca and left pleural effusion, still has left-sided chest tube, which precludes a safe discharge. Discharge to SNF, when clinically stable, needs pulmonary clearance.  Subjective: No significant events overnight, patient was sitting comfortably on the recliner,  shortness of breath and edema is getting better, denies any chest pain or palpitation, no any other active issues.  Chest tube is still intact.   Physical Exam: General: NAD, lying comfortably Appear in no distress, affect appropriate Eyes: PERRLA ENT: Oral Mucosa Clear, moist  Neck: no JVD,  Cardiovascular: S1 and S2 Present, no Murmur,  Respiratory: good respiratory effort, Bilateral Air entry equal, mild bibasilar crackles, no wheezing, left sided chest tube intact Abdomen: Bowel Sound present, Soft and no tenderness,  Skin: no rashes Extremities: 2+ Pedal edema, no calf tenderness.  Edema is gradually improving, dryness improving after Eucerin cream application. Neurologic: without any new focal findings Gait not checked due to patient safety concerns  Vitals:   04/24/22 0339 04/24/22 0500 04/24/22 0741 04/24/22 1122  BP: (!) 114/53  (!) 114/57 (!) 103/47  Pulse: 87  85 85  Resp: 18  18 18  $ Temp: 98.4 F (36.9 C)  98.7 F (37.1 C) 98.2 F (36.8 C)  TempSrc: Oral  Oral Oral  SpO2: 95%  96% 95%  Weight:  122.5 kg    Height:        Intake/Output Summary (Last 24 hours) at 04/24/2022 1440 Last data filed at 04/24/2022 1100 Gross per 24 hour  Intake 1920 ml  Output 1235 ml  Net 685 ml   Filed Weights   04/21/22 0414 04/22/22 0500 04/24/22 0500  Weight: 124.8 kg 123.7 kg 122.5 kg  Data Reviewed: I have personally reviewed and interpreted daily labs, tele strips, imagings as discussed above. I reviewed all nursing notes, pharmacy notes, vitals, pertinent old records I have discussed plan of care as described above with RN and patient/family.  CBC: Recent Labs  Lab 04/20/22 0520 04/21/22 0554 04/22/22 0547 04/23/22 0226 04/24/22 0554  WBC 5.9 6.2 6.9 6.9 7.1  HGB 7.8* 8.4* 8.3* 8.0* 7.9*  HCT 24.4* 26.9* 26.2* 25.4* 24.5*  MCV 88.4 88.5 89.1 87.9 87.5  PLT 234 269 269 279 [id number]   Basic Metabolic Panel: Recent Labs  Lab 04/18/22 0455 04/19/22 0532  04/20/22 0520 04/21/22 0554 04/22/22 0547 04/23/22 0226 04/24/22 0554  NA 138   < > 140 139 139 137 135  K 3.6   < > 3.7 4.4 3.9 3.7 3.9  CL 103   < > 99 98 100 99 97*  CO2 27   < > 32 33* 32 31 30  GLUCOSE 134*   < > 215* 202* 232* 193* 162*  BUN 74*   < > 53* 46* 41* 38* 41*  CREATININE 1.84*   < > 1.45* 1.33* 1.29* 1.32* 1.56*  CALCIUM 7.7*   < > 8.0* 8.2* 8.0* 7.9* 7.8*  MG 2.2  --   --   --   --  1.5* 2.1  PHOS 2.9  --   --   --   --  3.0 3.5   < > = values in this interval not displayed.    Studies: No results found.  Scheduled Meds:  sodium chloride   Intravenous Once   atorvastatin  80 mg Oral Daily   bisacodyl  10 mg Oral QHS   Chlorhexidine Gluconate Cloth  6 each Topical Daily   enoxaparin (LOVENOX) injection  0.5 mg/kg Subcutaneous Q24H   guaiFENesin  600 mg Oral BID   hydrocerin   Topical BID   insulin aspart  0-15 Units Subcutaneous TID WC   insulin aspart  0-5 Units Subcutaneous QHS   insulin aspart  8 Units Subcutaneous TID WC   insulin glargine-yfgn  34 Units Subcutaneous QHS   iron polysaccharides  150 mg Oral Daily   polyethylene glycol  17 g Oral BID   sodium chloride flush  3 mL Intravenous Q12H   spironolactone  50 mg Oral Daily   tacrolimus  1 mg Oral BID   tamsulosin  0.4 mg Oral QPC supper   torsemide  100 mg Oral Daily   Vitamin D (Ergocalciferol)  50,000 Units Oral Q7 days   Continuous Infusions:  methocarbamol (ROBAXIN) IV     PRN Meds: acetaminophen **OR** acetaminophen, bisacodyl, guaiFENesin-dextromethorphan, ipratropium-albuterol, methocarbamol (ROBAXIN) IV, ondansetron **OR** ondansetron (ZOFRAN) IV, oxyCODONE  Time spent: 35 minutes  Author: Val Riles. MD [facility] 04/24/2022 2:40 PM  To reach On-call, see care teams to locate the attending and reach out to them via [email or web address]. If 7PM-7AM, please contact night-coverage If you still have difficulty reaching the attending provider, please page the [facility] (Director on  Call) for [facility] on amion for assistance.

## 2022-04-24 NOTE — Care Management Important Message (Signed)
Important Message  Patient Details  Name: [name] MRN: [id number] Date of Birth: Jul 26, 1950   Medicare Important Message Given:  Yes     Dannette Barbara 04/24/2022, 2:17 PM

## 2022-04-24 NOTE — Progress Notes (Signed)
Pt foley catheter removed per order. Pt tolerated well. Pt was educated about the importance of voiding within 6 hour period.  Will continue to monitor.

## 2022-04-24 NOTE — Progress Notes (Signed)
PULMONOLOGY         Date: 04/24/2022,   MRN# [id number] [name] 1950-08-26     AdmissionWeight: (!) 138.7 kg                 CurrentWeight: 122.5 kg  Referring provider: Dr Roosevelt Locks   CHIEF COMPLAINT:   Parapneumonic effusion s/p chest tube placement    HISTORY OF PRESENT ILLNESS   This is a pleasant 72 year old male with severe morbid obesity chronic 3+ weeping lower extremity edema due to CKD with Membranous glomerulonephritis disease and recent evaluation with nephrology for rituximab infusion, history of CHF with reduced EF at 40%, osteoarthritis, diabetes, history of melanoma, neuropathy, spinal stenosis with chief complaint of shortness of breath and chest discomfort.  Patient is well-known to our pulmonology service with [facility] we recently saw him for acute on chronic hypoxemic respiratory failure noted to have left-sided large effusion and right-sided mild to moderate effusion.  Status postthoracentesis x 2 with over 1 L drained.  This is a third time that he is shown to have a large pleural effusion in stent 3 months.  He did receive pleural drain upon arrival to the ER with 1500 cc serosanguineous return overnight and active drainage this a.m. and up to 150 cc before lunchtime.  On interview and physical exam patient reports slight improvement mild chest discomfort at pigtail insertion site.  Chest tube management displays no airleak upon forced expiratory maneuver.   04/14/22- patient examined at bedside, s/p repeat CBC with severe anemia requiring PRBC transfusion. S/p [place] abd no ascites. Chest tube in place on left , persistent and recurrent effusion present on CXR 1/28.  Will repeat CXR again today.  On Lasix gtt with 600cc urine overnight. Development of transient hypotension with req for midodrine.   04/15/22-  patient reports nausea. He is on oxycodone.  He appears less swollen today. Overall he is net 1.3L negative and has active fluid per Chest  tube. He needs to start using incentive spirometer today.    04/16/22- patient had 150cc this am per chest tube.  Patient reports improvement clinically.  Continue chest tube for now.   04/17/22- patient on 2L/min nasal canula.  He still has straw colored phlegm returning from chest tube.  He coughed up phlegm with blood/rusty colored. He had obstructiive urophathy and had foley placed feels a lot better now. He is constipated but did have BM today shares he is much improved.  04/18/22- patient is resting in bed.  Still has straw colored return overnight >250cc which will likely continue.  He had cardiology DR [name] come to evaluate him today.   Renal function seems to be improving and I will repeat cxr today for interval effusion changes.  04/19/22- patient aspirated today and was coughing a lot during my evaluation.  Have ordered extra neb treatment.  He is on 2L/min [place].  Overnight >100cc return to chest tube, still unable to come out.  Have ordered physical therapy.   04/20/22- patient with <50cc overnight from chest tube. No air leak noted. He is with nonlabored breathing. He is on 2L/min [place] but using long cordage and is only receiving 1L to nares.  He's urinating well on aldactone 50/demadex 100 daily with Net negative 13L.  Renal function is improved. He's able to ambulate now.   04/21/22- patient is feeling better, he ws able to get up but not walk.  He had another 200cc output from chest tube.  Anemia is improved and renal function has improved, patient making good urine output. He is on PRN dilaudid and oxycodone, I recommend to stop both of those if possible.    04/22/22- patient had further improvement in renal function with FGR now 59, still he is making daily effusion per chest tube >100cc.  Id like to remove pigtail but fluid is actively being produced.  He is walking around and feeling much improved. He is on room air.    04/23/22- patient got up today walked around without dyspnea on room air. He  still is putting out fluid from chest tube.  We did CT chest today and noted a possible tumor that was obscured by overlying fluid.  I have asked pathology to review the pleural fluid in atrium with cell block for atypical cells, there is concern for bronchogenic carcinoma. He had foley removed today.   04/24/22- patient had no cancer cells in pleural fluid. We will likely need to schedule lung biopsy of mass when able.  IR consult for chest tube removal.   PAST MEDICAL HISTORY   Past Medical History:  Diagnosis Date   Arthritis    CHF (congestive heart failure), NYHA class II, acute on chronic, systolic (HCC)    EF [id number]   Coronary artery disease    [facility]   Diabetes mellitus without complication ([place])    Dysplastic nevus 08/26/2020   L flank, moderate atypia   Hyperlipidemia    Hypertension    Melanoma ([place]) 08/20/2015   Right neck. Superficial spreading, arising in nevus. Tumor thickness 1.66m, Anatomic level IV   [place] 05/22[place]   R lateral neck ant to scar, exc 05/22/20   Numbness in right leg    due to back   Skin cancer    removed l arm   Spinal stenosis    Stented coronary artery 2000   X 1 STENT   Swelling of both lower extremities      SURGICAL HISTORY   Past Surgical History:  Procedure Laterality Date   CATARACT EXTRACTION W/PHACO Right 05/03/2019   Procedure: CATARACT EXTRACTION PHACO AND INTRAOCULAR LENS PLACEMENT (IOC) RIGHT DIABETIC 6.44 00:58.4 11.0%;  Surgeon: BLeandrew Koyanagi MD;  [facility] [facility]  Service: Ophthalmology;  Laterality: Right;  DIABETIC   CATARACT EXTRACTION W/PHACO Left 06/28/2019   Procedure: CATARACT EXTRACTION PHACO AND INTRAOCULAR LENS PLACEMENT (IOC) LEFT DIABETIC 7.99  01:13.4  10.9% ;  Surgeon: BLeandrew Koyanagi MD;  [place] [facility]  Service: Ophthalmology;  Laterality: Left;  Diabetic - insulin and oral meds   CHOLECYSTECTOMY     JOINT REPLACEMENT      RT KNEE   LUMBAR LAMINECTOMY/DECOMPRESSION MICRODISCECTOMY  04/13/2012   Procedure: LUMBAR LAMINECTOMY/DECOMPRESSION MICRODISCECTOMY 1 LEVEL;  Surgeon: JJohnn Hai MD;  Location: [facility];  Service: Orthopedics;  Laterality: Bilateral;  L4-L5   UVULECTOMY  1990's     FAMILY HISTORY   History reviewed. No pertinent family history.   SOCIAL HISTORY   Social History   Tobacco Use   Smoking status: Never   Smokeless tobacco: Never  Vaping Use   Vaping Use: Never used  Substance Use Topics   Alcohol use: Yes    Comment: rare   Drug use: No     MEDICATIONS    Home Medication:     Current Medication:  Current Facility-Administered Medications:    0.9 %  sodium chloride infusion (Manually program via Guardrails IV Fluids), , Intravenous, Once,  Sharen Hones, MD   acetaminophen (TYLENOL) tablet 650 mg, 650 mg, Oral, Q6H PRN **OR** acetaminophen (TYLENOL) suppository 650 mg, 650 mg, Rectal, Q6H PRN, Sid Falcon, MD   atorvastatin (LIPITOR) tablet 80 mg, 80 mg, Oral, Daily, Gilles Chiquito B, MD, 80 mg at 04/23/22 1007   bisacodyl (DULCOLAX) EC tablet 10 mg, 10 mg, Oral, QHS, Val Riles, MD, 10 mg at 04/22/22 2142   bisacodyl (DULCOLAX) suppository 10 mg, 10 mg, Rectal, Daily PRN, Val Riles, MD   Chlorhexidine Gluconate Cloth 2 % PADS 6 each, 6 each, Topical, Daily, Val Riles, MD, 6 each at 04/23/22 1007   enoxaparin (LOVENOX) injection 62.5 mg, 0.5 mg/kg, Subcutaneous, Q24H, Coulter, Carolyn, RPH, 62.5 mg at 04/23/22 2227   guaiFENesin (MUCINEX) 12 hr tablet 600 mg, 600 mg, Oral, BID, Val Riles, MD, 600 mg at 04/23/22 2227   guaiFENesin-dextromethorphan (ROBITUSSIN DM) 100-10 MG/5ML syrup 5 mL, 5 mL, Oral, Q4H PRN, Sid Falcon, MD, 5 mL at 04/13/22 2154   hydrocerin (EUCERIN) cream, , Topical, BID, Val Riles, MD, Given at 04/23/22 2229   insulin aspart (novoLOG) injection 0-15 Units, 0-15 Units, Subcutaneous, TID WC, Val Riles, MD, 5 Units at  04/23/22 1725   insulin aspart (novoLOG) injection 0-5 Units, 0-5 Units, Subcutaneous, QHS, Val Riles, MD, 2 Units at 04/23/22 2228   insulin aspart (novoLOG) injection 8 Units, 8 Units, Subcutaneous, TID WC, Val Riles, MD, 8 Units at 04/23/22 1726   insulin glargine-yfgn (SEMGLEE) injection 34 Units, 34 Units, Subcutaneous, QHS, Val Riles, MD, 34 Units at 04/23/22 2228   ipratropium-albuterol (DUONEB) 0.5-2.5 (3) MG/3ML nebulizer solution 3 mL, 3 mL, Nebulization, Q4H PRN, Val Riles, MD   iron polysaccharides (NIFEREX) capsule 150 mg, 150 mg, Oral, Daily, Val Riles, MD, 150 mg at 04/23/22 1008   methocarbamol (ROBAXIN) 500 mg in dextrose 5 % 50 mL IVPB, 500 mg, Intravenous, Q6H PRN, Sid Falcon, MD   ondansetron (ZOFRAN) tablet 4 mg, 4 mg, Oral, Q6H PRN **OR** ondansetron (ZOFRAN) injection 4 mg, 4 mg, Intravenous, Q6H PRN, Gilles Chiquito B, MD, 4 mg at 04/23/22 1203   oxyCODONE (Oxy IR/ROXICODONE) immediate release tablet 5 mg, 5 mg, Oral, Q6H PRN, Val Riles, MD   polyethylene glycol (MIRALAX / GLYCOLAX) packet 17 g, 17 g, Oral, BID, Val Riles, MD, 17 g at 04/22/22 2142   sodium chloride flush (NS) 0.9 % injection 3 mL, 3 mL, Intravenous, Q12H, Gilles Chiquito B, MD, 3 mL at 04/23/22 2229   spironolactone (ALDACTONE) tablet 50 mg, 50 mg, Oral, Daily, Breeze, Shantelle, NP, 50 mg at 04/23/22 1007   tacrolimus (PROGRAF) capsule 1 mg, 1 mg, Oral, BID, Murlean Iba, MD, 1 mg at 04/23/22 2227   tamsulosin (FLOMAX) capsule 0.4 mg, 0.4 mg, Oral, QPC supper, Val Riles, MD, 0.4 mg at 04/23/22 1725   torsemide (DEMADEX) tablet 100 mg, 100 mg, Oral, Daily, Breeze, Shantelle, NP, 100 mg at 04/23/22 1007   Vitamin D (Ergocalciferol) (DRISDOL) 1.25 MG (50000 UNIT) capsule 50,000 Units, 50,000 Units, Oral, Q7 days, Val Riles, MD, 50,000 Units at 04/17/22 1228    ALLERGIES   Carvedilol, Metoprolol, Toprol xl [metoprolol tartrate], Buprenorphine, Buprenorphine hcl,  Morphine, Morphine and related, Triamcinolone, and Triamcinolone acetonide     REVIEW OF SYSTEMS    Review of Systems:  Gen:  Denies  fever, sweats, chills weigh loss  HEENT: Denies blurred vision, double vision, ear pain, eye pain, hearing loss, nose bleeds, sore throat Cardiac:  No dizziness, chest pain or  heaviness, chest tightness,edema Resp:   reports dyspnea chronically  Gi: Denies swallowing difficulty, stomach pain, nausea or vomiting, diarrhea, constipation, bowel incontinence Gu:  Denies bladder incontinence, burning urine Ext:   Denies Joint pain, stiffness or swelling Skin: Denies  skin rash, easy bruising or bleeding or hives Endoc:  Denies polyuria, polydipsia , polyphagia or weight change Psych:   Denies depression, insomnia or hallucinations   Other:  All other systems negative   VS: BP (!) 114/57 (BP Location: Right Arm)   Pulse 85   Temp 98.7 F (37.1 C) (Oral)   Resp 18   Ht 6' (1.829 m)   Wt 122.5 kg   SpO2 96%   BMI 36.63 kg/m      PHYSICAL EXAM    GENERAL:NAD, no fevers, chills, no weakness no fatigue HEAD: Normocephalic, atraumatic.  EYES: Pupils equal, round, reactive to light. Extraocular muscles intact. No scleral icterus.  MOUTH: Moist mucosal membrane. Dentition intact. No abscess noted.  EAR, NOSE, THROAT: Clear without exudates. No external lesions.  NECK: Supple. No thyromegaly. No nodules. No JVD.  PULMONARY: decreased breath sounds with mild rhonchi worse at bases bilaterally.  CARDIOVASCULAR: S1 and S2. Regular rate and rhythm. No murmurs, rubs, or gallops. No edema. Pedal pulses 2+ bilaterally.  GASTROINTESTINAL: Soft, nontender, nondistended. No masses. Positive bowel sounds. No hepatosplenomegaly.  MUSCULOSKELETAL: No swelling, clubbing, or edema. Range of motion full in all extremities.  NEUROLOGIC: Cranial nerves II through XII are intact. No gross focal neurological deficits. Sensation intact. Reflexes intact.  SKIN: No  ulceration, lesions, rashes, or cyanosis. Skin warm and dry. Turgor intact.  PSYCHIATRIC: Mood, affect within normal limits. The patient is awake, alert and oriented x 3. Insight, judgment intact.       IMAGING     ASSESSMENT/PLAN   Acute on chronic hypoxemic respiratory failure Present on admission due to influenza A infection with recurrent left pleural effusion and associated compressive atelectasis -Patient's evaluated by nephrology and is status post Lasix drip as well as pigtail chest tube on the left with 100CC overnight serosanguineous fluid overnight and active drain during examination today -Is no air leak upon forced aspiratory maneuver, chest tube is not clogged and fluid is freely flowing. -Fluid was previously tested and was noted to be lymphocyte predominant fluid and became exudative upon initiation of diuretics which is known to happen.  Does not appear to be empyema and I do not think he is a very good candidate for intrapleural thrombolytics.  Patient may be heading towards dialysis but if not he may also benefit from thoracic surgery evaluation and possible pleurodesis.     CKD - per nephro    Rutixan plan at some point.    Thank you for allowing me to participate in the care of this patient.   Patient/Family are satisfied with care plan and all questions have been answered.    Provider disclosure: Patient with at least one acute or chronic illness or injury that poses a threat to life or bodily function and is being managed actively during this encounter.  All of the below services have been performed independently by signing provider:  review of prior documentation from internal and or external health records.  Review of previous and current lab results.  Interview and comprehensive assessment during patient visit today. Review of current and previous chest radiographs/CT scans. Discussion of management and test interpretation with health care team and  patient/family.   This document was prepared using Set designer  software and may include unintentional dictation errors.     Ottie Glazier, M.D.  Division of Pulmonary & Critical Care Medicine

## 2022-04-24 NOTE — Plan of Care (Signed)
  Problem: Health Behavior/Discharge Planning: Goal: Ability to manage health-related needs will improve Outcome: Progressing   Problem: Health Behavior/Discharge Planning: Goal: Ability to manage health-related needs will improve Outcome: Progressing   Problem: Clinical Measurements: Goal: Will remain free from infection Outcome: Progressing   Problem: Clinical Measurements: Goal: Respiratory complications will improve Outcome: Progressing   Problem: Clinical Measurements: Goal: Cardiovascular complication will be avoided Outcome: Progressing   Problem: Activity: Goal: Risk for activity intolerance will decrease Outcome: Progressing   Problem: Elimination: Goal: Will not experience complications related to bowel motility Outcome: Progressing   Problem: Elimination: Goal: Will not experience complications related to urinary retention Outcome: Progressing   Problem: Pain Managment: Goal: General experience of comfort will improve Outcome: Progressing   Problem: Safety: Goal: Ability to remain free from injury will improve Outcome: Progressing

## 2022-04-24 NOTE — Progress Notes (Signed)
Physical Therapy Treatment Patient Details Name: [name] MRN: [id number] DOB: [birth year] Today's Date: 04/24/2022   History of Present Illness Pt is a 72 y.o. male with medical history significant of arthritis, CHF, CAD, DM2, HLD, HTN, treated melanoma, and spinal stenosis who presents for worsening cough, fatigue and SOB. MD assessment includes: Influenza A, hypoxic respiratory failure, hemothorax s/p L chest tube placement, AKI, acute on chronic anemia, hyperglycemia, type 2 NSTEMI related to hemothorax, blood loss, and tachycardia, and edema of the LE.    PT Comments    Pt received in bed A&O x 4 agreeable to PT interventions. Pt's Son by his side. Pt performed Supine to sit  to stand with set up assist for the chest tube and SBA.  Ambulated in room with FWW with Min guard and stood in bathroom to urinate without support. Pt wanted to be excused because he needed to have a BM. Pt accessed the bathroom with CGA and advised to pull the string for nursing help when ready. Pt tol treatment well. PT will continue while in acute care. Discharge planning needs to be adjusted base don pt's medical and functional progress to HHPT unless pt shows functional decline.    Recommendations for follow up therapy are one component of a multi-disciplinary discharge planning process, led by the attending physician.  Recommendations may be updated based on patient status, additional functional criteria and insurance authorization.  Follow Up Recommendations  Home health PT Can patient physically be transported by private vehicle: Yes   Assistance Recommended at Discharge Intermittent Supervision/Assistance (and set up)  Patient can return home with the following A little help with walking and/or transfers;A little help with bathing/dressing/bathroom;Assistance with cooking/housework;Assist for transportation;Help with stairs or ramp for entrance   Equipment Recommendations  Rolling walker (2  wheels);BSC/3in1    Recommendations for Other Services       Precautions / Restrictions Precautions Precautions: Fall Restrictions Weight Bearing Restrictions: No     Mobility  Bed Mobility Overal bed mobility: Needs Assistance Bed Mobility: Supine to Sit     Supine to sit: Supervision Sit to supine: Supervision   General bed mobility comments: set up 2/2 to chest tube    Transfers Overall transfer level: Needs assistance Equipment used: Rolling walker (2 wheels) Transfers: Sit to/from Stand Sit to Stand: Supervision                Ambulation/Gait Ambulation/Gait assistance: Min guard Gait Distance (Feet): 20 Feet Assistive device: Rolling walker (2 wheels) Gait Pattern/deviations: Step-through pattern, Decreased stride length Gait velocity: dec     General Gait Details: pt steady but slow.   Stairs; N/T             Wheelchair Mobility    Modified Rankin (Stroke Patients Only)       Balance Overall balance assessment: Needs assistance Sitting-balance support: Bilateral upper extremity supported, Feet supported Sitting balance-Leahy Scale: Normal     Standing balance support: Bilateral upper extremity supported Standing balance-Leahy Scale: Good Standing balance comment: in standing use urinal without assistance.                            Cognition Arousal/Alertness: Awake/alert Behavior During Therapy: WFL for tasks assessed/performed Overall Cognitive Status: Within Functional Limits for tasks assessed  Exercises      General Comments        Pertinent Vitals/Pain Pain Assessment Pain Assessment: No/denies pain    Home Living                          Prior Function            PT Goals (current goals can now be found in the care plan section) Acute Rehab PT Goals PT Goal Formulation: With patient Time For Goal Achievement:  04/28/22 Potential to Achieve Goals: Good Progress towards PT goals: Progressing toward goals    Frequency    Min 2X/week      PT Plan Discharge plan needs to be updated    Co-evaluation              AM-PAC PT "6 Clicks" Mobility   Outcome Measure  Help needed turning from your back to your side while in a flat bed without using bedrails?: None Help needed moving from lying on your back to sitting on the side of a flat bed without using bedrails?: None Help needed moving to and from a bed to a chair (including a wheelchair)?: A Little Help needed standing up from a chair using your arms (e.g., wheelchair or bedside chair)?: A Little Help needed to walk in hospital room?: A Little Help needed climbing 3-5 steps with a railing? : Total 6 Click Score: 18    End of Session         PT Visit Diagnosis: Unsteadiness on feet (R26.81);Muscle weakness (generalized) (M62.81);Difficulty in walking, not elsewhere classified (R26.2)     Time: [id number] PT Time Calculation (min) (ACUTE ONLY): 15 min  Charges:  $Gait Training: 8-22 mins                     Joaquin Music PT DPT 4:37 PM,04/24/22

## 2022-04-25 ENCOUNTER — Inpatient Hospital Stay: Payer: Medicare HMO

## 2022-04-25 DIAGNOSIS — J90 Pleural effusion, not elsewhere classified: Secondary | ICD-10-CM | POA: Diagnosis not present

## 2022-04-25 LAB — CBC
HCT: 24.6 % — ABNORMAL LOW (ref 39.0–52.0)
Hemoglobin: 7.7 g/dL — ABNORMAL LOW (ref 13.0–17.0)
MCH: 28.1 pg (ref 26.0–34.0)
MCHC: 31.3 g/dL (ref 30.0–36.0)
MCV: 89.8 fL (ref 80.0–100.0)
Platelets: 267 10*3/uL (ref 150–400)
RBC: 2.74 MIL/uL — ABNORMAL LOW (ref 4.22–5.81)
RDW: 15.8 % — ABNORMAL HIGH (ref 11.5–15.5)
WBC: 7.9 10*3/uL (ref 4.0–10.5)
nRBC: 0 % (ref 0.0–0.2)

## 2022-04-25 LAB — BASIC METABOLIC PANEL
Anion gap: 7 (ref 5–15)
BUN: 41 mg/dL — ABNORMAL HIGH (ref 8–23)
CO2: 31 mmol/L (ref 22–32)
Calcium: 7.8 mg/dL — ABNORMAL LOW (ref 8.9–10.3)
Chloride: 94 mmol/L — ABNORMAL LOW (ref 98–111)
Creatinine, Ser: 1.72 mg/dL — ABNORMAL HIGH (ref 0.61–1.24)
GFR, Estimated: 41 mL/min — ABNORMAL LOW (ref >60)
Glucose, Bld: 210 mg/dL — ABNORMAL HIGH (ref 70–99)
Potassium: 4 mmol/L (ref 3.5–5.1)
Sodium: 132 mmol/L — ABNORMAL LOW (ref 135–145)

## 2022-04-25 LAB — PHOSPHORUS: Phosphorus: 3.9 mg/dL (ref 2.5–4.6)

## 2022-04-25 LAB — FUNGUS CULTURE RESULT

## 2022-04-25 LAB — FUNGUS CULTURE WITH STAIN

## 2022-04-25 LAB — GLUCOSE, CAPILLARY
Glucose-Capillary: 201 mg/dL — ABNORMAL HIGH (ref 70–99)
Glucose-Capillary: 284 mg/dL — ABNORMAL HIGH (ref 70–99)
Glucose-Capillary: 257 mg/dL — ABNORMAL HIGH (ref 70–99)
Glucose-Capillary: 279 mg/dL — ABNORMAL HIGH (ref 70–99)

## 2022-04-25 LAB — MAGNESIUM: Magnesium: 1.7 mg/dL (ref 1.7–2.4)

## 2022-04-25 LAB — FUNGAL ORGANISM REFLEX

## 2022-04-25 NOTE — Progress Notes (Signed)
PULMONOLOGY         Date: 04/25/2022,   MRN# [id number] ASEEL LAWS [birth year]     AdmissionWeight: (!) 138.7 kg                 CurrentWeight: 121.5 kg  Referring provider: Dr Roosevelt Locks   CHIEF COMPLAINT:   Parapneumonic effusion s/p chest tube placement    HISTORY OF PRESENT ILLNESS   This is a pleasant 72 year old male with severe morbid obesity chronic 3+ weeping lower extremity edema due to CKD with Membranous glomerulonephritis disease and recent evaluation with nephrology for rituximab infusion, history of CHF with reduced EF at 40%, osteoarthritis, diabetes, history of melanoma, neuropathy, spinal stenosis with chief complaint of shortness of breath and chest discomfort.  Patient is well-known to our pulmonology service with [facility] clinic we recently saw him for acute on chronic hypoxemic respiratory failure noted to have left-sided large effusion and right-sided mild to moderate effusion.  Status postthoracentesis x 2 with over 1 L drained.  This is a third time that he is shown to have a large pleural effusion in stent 3 months.  He did receive pleural drain upon arrival to the ER with 1500 cc serosanguineous return overnight and active drainage this a.m. and up to 150 cc before lunchtime.  On interview and physical exam patient reports slight improvement mild chest discomfort at pigtail insertion site.  Chest tube management displays no airleak upon forced expiratory maneuver.   04/14/22- patient examined at bedside, s/p repeat CBC with severe anemia requiring PRBC transfusion. S/p [place] abd no ascites. Chest tube in place on left , persistent and recurrent effusion present on CXR 1/28.  Will repeat CXR again today.  On Lasix gtt with 600cc urine overnight. Development of transient hypotension with req for midodrine.   04/15/22-  patient reports nausea. He is on oxycodone.  He appears less swollen today. Overall he is net 1.3L negative and has active fluid per Chest  tube. He needs to start using incentive spirometer today.    04/16/22- patient had 150cc this am per chest tube.  Patient reports improvement clinically.  Continue chest tube for now.   04/17/22- patient on 2L/min nasal canula.  He still has straw colored phlegm returning from chest tube.  He coughed up phlegm with blood/rusty colored. He had obstructiive urophathy and had foley placed feels a lot better now. He is constipated but did have BM today shares he is much improved.  04/18/22- patient is resting in bed.  Still has straw colored return overnight >250cc which will likely continue.  He had cardiology DR [name] come to evaluate him today.   Renal function seems to be improving and I will repeat cxr today for interval effusion changes.  04/19/22- patient aspirated today and was coughing a lot during my evaluation.  Have ordered extra neb treatment.  He is on 2L/min [place].  Overnight >100cc return to chest tube, still unable to come out.  Have ordered physical therapy.   04/20/22- patient with <50cc overnight from chest tube. No air leak noted. He is with nonlabored breathing. He is on 2L/min  but using long cordage and is only receiving 1L to nares.  He's urinating well on aldactone 50/demadex 100 daily with Net negative 13L.  Renal function is improved. He's able to ambulate now.   04/21/22- patient is feeling better, he ws able to get up but not walk.  He had another 200cc output from chest tube.  Anemia is improved and renal function has improved, patient making good urine output. He is on PRN dilaudid and oxycodone, I recommend to stop both of those if possible.    04/22/22- patient had further improvement in renal function with FGR now 59, still he is making daily effusion per chest tube >100cc.  Id like to remove pigtail but fluid is actively being produced.  He is walking around and feeling much improved. He is on room air.    04/23/22- patient got up today walked around without dyspnea on room air. He  still is putting out fluid from chest tube.  We did CT chest today and noted a possible tumor that was obscured by overlying fluid.  I have asked pathology to review the pleural fluid in atrium with cell block for atypical cells, there is concern for bronchogenic carcinoma. He had foley removed today.   04/24/22- patient had no cancer cells in pleural fluid. We will likely need to schedule lung biopsy of mass when able.  IR consult for chest tube removal.   04/25/22- patient is improved, had <40cc output from chest tube.  He is on room air and wishes to go home.  I discussed possible pleurX catheter placement for him for palliative fluid management but he wishes to wait to see how he does for a while.  His renal impairment is much improved.   He has Left lower lobe lung mass and will need PET scan and likely lung biopsy this will need to be done on outpatient post DC home.  He had CXR post chest tube removal with improved lung aeration compared to before chest tube. PAST MEDICAL HISTORY   Past Medical History:  Diagnosis Date   Arthritis    CHF (congestive heart failure), NYHA class II, acute on chronic, systolic (HCC)    EF [id number]   Coronary artery disease    [facility]   Diabetes mellitus without complication ([place])    Dysplastic nevus 08/26/2020   L flank, moderate atypia   Hyperlipidemia    Hypertension    Melanoma ([place]) 08/20/2015   Right neck. Superficial spreading, arising in nevus. Tumor thickness 1.66m, Anatomic level IV   [place]   R lateral neck ant to scar, exc 05/22/20   Numbness in right leg    due to back   Skin cancer    removed l arm   Spinal stenosis    Stented coronary artery 2000   X 1 STENT   Swelling of both lower extremities      SURGICAL HISTORY   Past Surgical History:  Procedure Laterality Date   CATARACT EXTRACTION W/PHACO Right 05/03/2019   Procedure: CATARACT EXTRACTION PHACO AND INTRAOCULAR LENS PLACEMENT  (IOC) RIGHT DIABETIC 6.44 00:58.4 11.0%;  Surgeon: BLeandrew Koyanagi MD;  [facility] [facility]  Service: Ophthalmology;  Laterality: Right;  DIABETIC   CATARACT EXTRACTION W/PHACO Left 06/28/2019   Procedure: CATARACT EXTRACTION PHACO AND INTRAOCULAR LENS PLACEMENT (IOC) LEFT DIABETIC 7.99  01:13.4  10.9% ;  Surgeon: BLeandrew Koyanagi MD;  [facility]  Service: Ophthalmology;  Laterality: Left;  Diabetic - insulin and oral meds   CHOLECYSTECTOMY     JOINT REPLACEMENT     RT KNEE   LUMBAR LAMINECTOMY/DECOMPRESSION MICRODISCECTOMY  04/13/2012   Procedure: LUMBAR LAMINECTOMY/DECOMPRESSION MICRODISCECTOMY 1 LEVEL;  Surgeon: JJohnn Hai MD;  Location: [facility];  Service: Orthopedics;  Laterality: Bilateral;  L4-L5   UVULECTOMY  1990's  FAMILY HISTORY   History reviewed. No pertinent family history.   SOCIAL HISTORY   Social History   Tobacco Use   Smoking status: Never   Smokeless tobacco: Never  Vaping Use   Vaping Use: Never used  Substance Use Topics   Alcohol use: Yes    Comment: rare   Drug use: No     MEDICATIONS    Home Medication:     Current Medication:  Current Facility-Administered Medications:    0.9 %  sodium chloride infusion (Manually program via Guardrails IV Fluids), , Intravenous, Once, Sharen Hones, MD   acetaminophen (TYLENOL) tablet 650 mg, 650 mg, Oral, Q6H PRN **OR** acetaminophen (TYLENOL) suppository 650 mg, 650 mg, Rectal, Q6H PRN, Sid Falcon, MD   atorvastatin (LIPITOR) tablet 80 mg, 80 mg, Oral, Daily, Gilles Chiquito B, MD, 80 mg at 04/24/22 0914   bisacodyl (DULCOLAX) EC tablet 10 mg, 10 mg, Oral, QHS, Val Riles, MD, 10 mg at 04/22/22 2142   bisacodyl (DULCOLAX) suppository 10 mg, 10 mg, Rectal, Daily PRN, Val Riles, MD   Chlorhexidine Gluconate Cloth 2 % PADS 6 each, 6 each, Topical, Daily, Val Riles, MD, 6 each at 04/25/22 0842   enoxaparin (LOVENOX) injection 62.5 mg, 0.5 mg/kg,  Subcutaneous, Q24H, Coulter, Carolyn, RPH, 62.5 mg at 04/24/22 2124   guaiFENesin (MUCINEX) 12 hr tablet 600 mg, 600 mg, Oral, BID, Val Riles, MD, 600 mg at 04/24/22 2123   guaiFENesin-dextromethorphan (ROBITUSSIN DM) 100-10 MG/5ML syrup 5 mL, 5 mL, Oral, Q4H PRN, Sid Falcon, MD, 5 mL at 04/13/22 2154   hydrocerin (EUCERIN) cream, , Topical, BID, Val Riles, MD, Given at 04/24/22 2125   insulin aspart (novoLOG) injection 0-15 Units, 0-15 Units, Subcutaneous, TID WC, Val Riles, MD, 5 Units at 04/24/22 1643   insulin aspart (novoLOG) injection 0-5 Units, 0-5 Units, Subcutaneous, QHS, Val Riles, MD, 3 Units at 04/24/22 2124   insulin aspart (novoLOG) injection 8 Units, 8 Units, Subcutaneous, TID WC, Val Riles, MD, 8 Units at 04/24/22 1645   insulin glargine-yfgn (SEMGLEE) injection 34 Units, 34 Units, Subcutaneous, QHS, Val Riles, MD, 34 Units at 04/24/22 2124   ipratropium-albuterol (DUONEB) 0.5-2.5 (3) MG/3ML nebulizer solution 3 mL, 3 mL, Nebulization, Q4H PRN, Val Riles, MD   iron polysaccharides (NIFEREX) capsule 150 mg, 150 mg, Oral, Daily, Val Riles, MD, 150 mg at 04/24/22 [id number]   methocarbamol (ROBAXIN) 500 mg in dextrose 5 % 50 mL IVPB, 500 mg, Intravenous, Q6H PRN, Sid Falcon, MD   ondansetron (ZOFRAN) tablet 4 mg, 4 mg, Oral, Q6H PRN **OR** ondansetron (ZOFRAN) injection 4 mg, 4 mg, Intravenous, Q6H PRN, Gilles Chiquito B, MD, 4 mg at 04/23/22 1203   oxyCODONE (Oxy IR/ROXICODONE) immediate release tablet 5 mg, 5 mg, Oral, Q6H PRN, Val Riles, MD   polyethylene glycol (MIRALAX / GLYCOLAX) packet 17 g, 17 g, Oral, BID, Val Riles, MD, 17 g at 04/24/22 0917   sodium chloride flush (NS) 0.9 % injection 3 mL, 3 mL, Intravenous, Q12H, Gilles Chiquito B, MD, 3 mL at 04/25/22 [id number]   spironolactone (ALDACTONE) tablet 50 mg, 50 mg, Oral, Daily, Breeze, Shantelle, NP, 50 mg at 04/24/22 0914   tacrolimus (PROGRAF) capsule 1 mg, 1 mg, Oral, BID, Candiss Norse, [place],  MD, 1 mg at 04/24/22 2123   tamsulosin (FLOMAX) capsule 0.4 mg, 0.4 mg, Oral, QPC supper, Val Riles, MD, 0.4 mg at 04/24/22 1643   torsemide (DEMADEX) tablet 100 mg, 100 mg, Oral, Daily, Breeze, Shantelle, NP, 100 mg at  04/24/22 0914   Vitamin D (Ergocalciferol) (DRISDOL) 1.25 MG (50000 UNIT) capsule 50,000 Units, 50,000 Units, Oral, Q7 days, Val Riles, MD, 50,000 Units at 04/24/22 [id number]    ALLERGIES   Carvedilol, Metoprolol, Toprol xl [metoprolol tartrate], Buprenorphine, Buprenorphine hcl, Morphine, Morphine and related, Triamcinolone, and Triamcinolone acetonide     REVIEW OF SYSTEMS    Review of Systems:  Gen:  Denies  fever, sweats, chills weigh loss  HEENT: Denies blurred vision, double vision, ear pain, eye pain, hearing loss, nose bleeds, sore throat Cardiac:  No dizziness, chest pain or heaviness, chest tightness,edema Resp:   reports dyspnea chronically  Gi: Denies swallowing difficulty, stomach pain, nausea or vomiting, diarrhea, constipation, bowel incontinence Gu:  Denies bladder incontinence, burning urine Ext:   Denies Joint pain, stiffness or swelling Skin: Denies  skin rash, easy bruising or bleeding or hives Endoc:  Denies polyuria, polydipsia , polyphagia or weight change Psych:   Denies depression, insomnia or hallucinations   Other:  All other systems negative   VS: BP (!) 114/46 (BP Location: Right Arm)   Pulse 79   Temp (!) 97.5 F (36.4 C) (Oral)   Resp 17   Ht 6' (1.829 m)   Wt 121.5 kg   SpO2 98%   BMI 36.33 kg/m      PHYSICAL EXAM    GENERAL:NAD, no fevers, chills, no weakness no fatigue HEAD: Normocephalic, atraumatic.  EYES: Pupils equal, round, reactive to light. Extraocular muscles intact. No scleral icterus.  MOUTH: Moist mucosal membrane. Dentition intact. No abscess noted.  EAR, NOSE, THROAT: Clear without exudates. No external lesions.  NECK: Supple. No thyromegaly. No nodules. No JVD.  PULMONARY: decreased breath sounds  with mild rhonchi worse at bases bilaterally.  CARDIOVASCULAR: S1 and S2. Regular rate and rhythm. No murmurs, rubs, or gallops. No edema. Pedal pulses 2+ bilaterally.  GASTROINTESTINAL: Soft, nontender, nondistended. No masses. Positive bowel sounds. No hepatosplenomegaly.  MUSCULOSKELETAL: No swelling, clubbing, or edema. Range of motion full in all extremities.  NEUROLOGIC: Cranial nerves II through XII are intact. No gross focal neurological deficits. Sensation intact. Reflexes intact.  SKIN: No ulceration, lesions, rashes, or cyanosis. Skin warm and dry. Turgor intact.  PSYCHIATRIC: Mood, affect within normal limits. The patient is awake, alert and oriented x 3. Insight, judgment intact.       IMAGING     ASSESSMENT/PLAN   Acute on chronic hypoxemic respiratory failure Present on admission due to influenza A infection with recurrent left pleural effusion and associated compressive atelectasis -Patient's evaluated by nephrology and is status post Lasix drip as well as pigtail chest tube on the left with 100CC overnight serosanguineous fluid overnight and active drain during examination today -Is no air leak upon forced aspiratory maneuver, chest tube is not clogged and fluid is freely flowing. -Fluid was previously tested and was noted to be lymphocyte predominant fluid and became exudative upon initiation of diuretics which is known to happen.  Does not appear to be empyema and I do not think he is a very good candidate for intrapleural thrombolytics.  Patient may be heading towards dialysis but if not he may also benefit from thoracic surgery evaluation and possible pleurodesis.     CKD - per nephro    Rutixan plan at some point.    Thank you for allowing me to participate in the care of this patient.   Patient/Family are satisfied with care plan and all questions have been answered.    Provider disclosure: Patient with  at least one acute or chronic illness or injury that  poses a threat to life or bodily function and is being managed actively during this encounter.  All of the below services have been performed independently by signing provider:  review of prior documentation from internal and or external health records.  Review of previous and current lab results.  Interview and comprehensive assessment during patient visit today. Review of current and previous chest radiographs/CT scans. Discussion of management and test interpretation with health care team and patient/family.   This document was prepared using Dragon voice recognition software and may include unintentional dictation errors.     Ottie Glazier, M.D.  Division of Pulmonary & Critical Care Medicine

## 2022-04-25 NOTE — Progress Notes (Signed)
  Progress Note   Patient: [name] IRC:[id number] DOB: [birth year] DOA: 04/11/2022     13 DOS: the patient was seen and examined on 04/25/2022   Brief hospital course:  Assessment and Plan:  Acute on chronic combined systolic and diastolic congestive heart failure Acute hypoxemic respiratory failure secondary to volume overload. Elevated troponin with NSTEMI. Coronary artery disease. - Lipitor 80 mg PO daily  - Torsemide 100 mg PO daily  - Aldactone 50 mg PO daily    Recurrent left side pleural effusion.  - Chest tube removed 04/25/2022 - Duoneb q4 hr PRN  - Mucinex 600 mg PO bid  - Robitussin 5 mL q4 hr PRN  - Oxycodone 5 mg PO q6 hr PRN    Influenza A. - Resolved   Acute kidney injury on chronic kidney disease stage II. Idiopathic membranous glomerulonephritis. Hyponatremia. - BUN/Cr stable  - Prograf 1 mg PO bid    Hypomagnesemia - Monitor    Iron deficient anemia. Acute blood loss anemia ruled out. - Monitor  - Niferex 150 mg PO daily    Uncontrolled type 2 diabetes with hyperglycemia - Novolog SS ACHS  - Novolog 8 unit sq tid  - Glargine 34 units sq daily    Essential hypertension - Monitor    Morbid obesity BMI 41.47, Likely obstructive sleep apnea. - Complicating care   Urinary retention - Monitor  - Flomax 0.4 mg PO daily    Vitamin D deficiency - VitD 50,000 units PO weekly   DVT prophylaxis: Lovenox 62.5 mg sq daily      Subjective: Pt seen and examined at the bedside. Chest tube removed today by pulmonary. He still has lower ext edema which is improved but not resolved.   Physical Exam: Vitals:   04/25/22 0451 04/25/22 0559 04/25/22 0833 04/25/22 1137  BP: 129/60 (!) 139/98 (!) 114/46 107/64  Pulse: 90 (!) 105 79 81  Resp: 16 17    Temp: 98.7 F (37.1 C) (!) 97.5 F (36.4 C)    TempSrc: Oral Oral    SpO2: 97% 96% 98% 100%  Weight: 121.5 kg     Height:       Physical Exam Constitutional:      Appearance: He is  well-developed.  HENT:     Head: Normocephalic.  Cardiovascular:     Rate and Rhythm: Normal rate and regular rhythm.  Pulmonary:     Effort: Pulmonary effort is normal.  Abdominal:     Palpations: Abdomen is soft.  Skin:    General: Skin is warm.     Comments: 2+ pitting edema lower legs   Neurological:     Mental Status: He is alert.  Psychiatric:        Mood and Affect: Mood normal.    Data Reviewed:   Disposition: Status is: Inpatient  Planned Discharge Destination: Home    Time spent: 35 minutes  Author: Lucienne Minks , MD 04/25/2022 2:30 PM  For on call review [email or web address].

## 2022-04-25 NOTE — Procedures (Signed)
Repair and Closure of Chest wall Thoracotomy Wound Procedure Note:  Indication: Thoracotomy wound with extravasation of pleural fluid  Consent:given by patient   Hand washing performed prior to starting the procedure.   Procedure:   An active timeout was performed and correct patient, name, & ID confirmed.    Patient was positioned correctly for surgical wound closure.  Patient was prepped using strict sterile technique including chlorohexadine preps, and I wore sterile gown and sterile gloves.    The area was prepped, draped and anesthetized in the usual sterile manner. Patient comfort was obtained.    1.5 cm wide wound down to pleural space was noted to be leaking pleural fluid at left chest wall mid axillary line.  Patient required surgical closure of thoracotomy wound. I was draped and wore sterile gloves per usual sterile protocol.  Area was sterilized with Chlorhexidine x 2.  14m of 1% lidocaine solution was delivered to subcutanous tissue of wound.    The wound was closed utilizing 2-0 vicryl sutures.  3 sutures were placed to close thoracotomy wound.  0.764mDermabond advanced topical solution was applied. Dressing with gauze and tegaderm applied over sutured area.  Patient tolerated procedure well with no bleeding and denied pain.  Post procedure CXR was performed with no pneumothorax post procedure.   Number of Attempts: 1 Complications:none Estimated Blood Loss: none Chest Radiograph reviewed no pneumothorax    FuOttie GlazierM.D.  Pulmonary & [facility] [place]

## 2022-04-26 ENCOUNTER — Inpatient Hospital Stay: Payer: Medicare HMO

## 2022-04-26 DIAGNOSIS — J90 Pleural effusion, not elsewhere classified: Secondary | ICD-10-CM | POA: Diagnosis not present

## 2022-04-26 LAB — GLUCOSE, CAPILLARY
Glucose-Capillary: 163 mg/dL — ABNORMAL HIGH (ref 70–99)
Glucose-Capillary: 281 mg/dL — ABNORMAL HIGH (ref 70–99)
Glucose-Capillary: 299 mg/dL — ABNORMAL HIGH (ref 70–99)

## 2022-04-26 LAB — BASIC METABOLIC PANEL
Anion gap: 7 (ref 5–15)
BUN: 40 mg/dL — ABNORMAL HIGH (ref 8–23)
CO2: 30 mmol/L (ref 22–32)
Calcium: 8 mg/dL — ABNORMAL LOW (ref 8.9–10.3)
Chloride: 99 mmol/L (ref 98–111)
Creatinine, Ser: 1.41 mg/dL — ABNORMAL HIGH (ref 0.61–1.24)
GFR, Estimated: 53 mL/min — ABNORMAL LOW (ref >60)
Glucose, Bld: 185 mg/dL — ABNORMAL HIGH (ref 70–99)
Potassium: 4.1 mmol/L (ref 3.5–5.1)
Sodium: 136 mmol/L (ref 135–145)

## 2022-04-26 LAB — CBC
HCT: 24.3 % — ABNORMAL LOW (ref 39.0–52.0)
Hemoglobin: 7.6 g/dL — ABNORMAL LOW (ref 13.0–17.0)
MCH: 27.8 pg (ref 26.0–34.0)
MCHC: 31.3 g/dL (ref 30.0–36.0)
MCV: 89 fL (ref 80.0–100.0)
Platelets: 266 10*3/uL (ref 150–400)
RBC: 2.73 MIL/uL — ABNORMAL LOW (ref 4.22–5.81)
RDW: 15.9 % — ABNORMAL HIGH (ref 11.5–15.5)
WBC: 7.2 10*3/uL (ref 4.0–10.5)
nRBC: 0 % (ref 0.0–0.2)

## 2022-04-26 LAB — PHOSPHORUS: Phosphorus: 3.5 mg/dL (ref 2.5–4.6)

## 2022-04-26 LAB — MAGNESIUM: Magnesium: 1.7 mg/dL (ref 1.7–2.4)

## 2022-04-26 MED ORDER — TAMSULOSIN HCL 0.4 MG PO CAPS: 0.4000 mg | ORAL_CAPSULE | Freq: Every day | ORAL | 11 refills | Status: DC

## 2022-04-26 MED ORDER — POLYETHYLENE GLYCOL 3350 17 G PO PACK: 17.0000 g | PACK | Freq: Every day | ORAL | 0 refills | Status: AC

## 2022-04-26 MED ORDER — BISACODYL 5 MG PO TBEC: 10.0000 mg | DELAYED_RELEASE_TABLET | Freq: Every day | ORAL | 0 refills | Status: DC

## 2022-04-26 MED ORDER — TACROLIMUS 1 MG PO CAPS: 1.0000 mg | ORAL_CAPSULE | Freq: Two times a day (BID) | ORAL | 11 refills | Status: DC

## 2022-04-26 MED ORDER — GUAIFENESIN-DM 100-10 MG/5ML PO SYRP: 5.0000 mL | ORAL_SOLUTION | ORAL | 0 refills | Status: DC | PRN

## 2022-04-26 MED ORDER — VITAMIN D (ERGOCALCIFEROL) 1.25 MG (50000 UNIT) PO CAPS: 50000.0000 [IU] | ORAL_CAPSULE | ORAL | 0 refills | Status: DC

## 2022-04-26 MED ORDER — GUAIFENESIN ER 600 MG PO TB12: 600.0000 mg | ORAL_TABLET | Freq: Two times a day (BID) | ORAL | 0 refills | Status: DC

## 2022-04-26 NOTE — Discharge Summary (Addendum)
[facility] Discharge Summary   Patient: [name] [id number]  PCP: Juluis Pitch, MD  Date of admission: 04/11/2022   Date of discharge:  04/26/2022     Discharge Diagnoses:  Principal Problem:   Recurrent left pleural effusion Active Problems:   Iron deficiency anemia   Diabetes mellitus type 2 in obese (HCC)   CAD (coronary artery disease)   Essential (primary) hypertension   Hyperlipidemia, mixed   Obesity, Class III, BMI 40-49.9 (morbid obesity) ([place])   Chronic kidney disease due to type 2 diabetes mellitus (HCC)   Edema of lower extremity   Acute on chronic systolic CHF (congestive heart failure) (HCC)   Influenza A   Acute renal failure superimposed on stage 2 chronic kidney disease ([place])   Acute hypoxemic respiratory failure (HCC)   Chronic membranous glomerulonephritis   Admitted From: Home Disposition:  Home   Recommendations for Outpatient Follow-up:  Follow-up with PCP in 1 week Follow-up with nephrology and cardiology in 1 week for volume overload management. Follow with pulmonary for PET scan and lung biopsy as an outpatient in 1 to 2 weeks Follow up LABS/TEST: Repeat chest x-ray in 1 to 2 weeks, PET scan as an outpatient and may need lung biopsy Repeat CBC after 1 to 2 weeks   Follow-up Information     Paraschos, Alexander, MD. Go in 1 week(s).   Specialty: Cardiology Contact information: [facility] [place] [address] [phone]                Diet recommendation: Carb modified diet  Activity: The patient is advised to gradually reintroduce usual activities, as tolerated  Discharge Condition: stable  Code Status: Full code   History of present illness: As per the H and P dictated on admission Hospital Course:  [name] is a 72 y.o. male with PMH of arthritis, CHF (chronic), CAD, DM2, HLD, HTN, h/o treated melanoma, spinal stenosis who presents for worsening cough, fatigue  and sob. Had low-grade fever, he was found to have positive for influenza A. He had recurrent left-sided pleural effusion, had a thoracentesis x 2 during the last months.  Upon arrival emergency room, chest x-ray showed large left-sided pleural effusion again, chest tube was placed by ED physician. Patient also developed significant hypoxemia with oxygen saturation as low as 76% on room air this morning.  Treated with oxygen. Patient has severe volume overload with evidence of bichamber heart failure.  Echocardiogram showed EF 45-50% with grade 2 diastolic dysfunction. Pulm valve not visualized. S/p Furosemide drip started after seen by nephrology.      Assessment and Plan: # Acute on chronic combined systolic and diastolic congestive heart failure # Acute hypoxemic respiratory failure secondary to volume overload. # Elevated troponin due to demand ischemia # Coronary artery disease. Supplemental O2 inhalation was gradually weaned off.  Patient presented with volume overload, BNP was elevated.  Biventricular heart failure.  [place] AMB negative for ascites.  Nephrology was also consulted.  S/p IV Lasix infusion, transition to torsemide 100 mg pod and Aldactone 25 mg pod.  Cardiology was also consulted, patient was on valsartan 150 mg p.o. daily which can be resumed when blood pressure allows. Patient has intolerance to metoprolol and Coreg (itching).  Cardio signed off.  Urine output improved after Foley catheter insertion. # Recurrent left side pleural effusion.  Chest tube was inserted, fluid was continuously drained, cytology was negative x 2.  Repeat CT chest showed LLL mass suspicious  for neoplasm, patient will need PET scan and lung biopsy as an outpatient.  Pulmonary consult appreciated, chest tube was removed on [id number], no complications post removal, no pneumothorax.  Currently patient is on room air.  PleurX catheter placement was discussed with patient for palliative fluid management but patient  would like to wait to see if there is reaccumulation of pleural effusion.  Patient was cleared by pulmonary to discharge and follow-up as an outpatient. # Influenza A. S/p Tamiflu for 5 days, last dose on 04/16/22 2/1 started Mucinex 600 mg p.o. twice daily due to chest congestion # Acute kidney injury on chronic kidney disease stage II. # Idiopathic membranous glomerulonephritis. # Hyponatremia. Resolved  Patient presented with volume overload, Lasix IV infusion was started, transition to oral diuretics.  Initially renal functions were getting worse, improved after Foley catheter.  No need of hemodialysis at this time. Nephrology is working with oncology to start rituximab.  2/4 started tacrolimus 1 mg p.o. twice daily until patient is able to take rituximab 2/11 Creatinine 1.41 stable, continue to monitor # Hypomagnesemia, mag repleted.  resolved # Iron deficient anemia, # Acute blood loss anemia ruled out. Patient received IV iron, hemoglobin dropped down to 6.9, chest tube fluids does not seem to be bloody.  s/p 1 unit PRBC.  No evidence of GI bleed. Hb 7.6 today.  Follow-up with PCP, repeat CBC after 1 to 2 weeks. # Uncontrolled type 2 diabetes with hyperglycemia, resumed home regimen, recommended to continue diabetic diet and monitor FSBG (testing blood glucose) at home. # Essential hypertension, BP was stable, patient was discharged on torsemide and Aldactone. # Morbid obesity BMI 41.47, Obstructive sleep apnea: Calorie restricted diet and daily exercise advised to lose body weight. Patient may need sleep study as an outpatient for CPAP.  Follow-up with pulmonologist as an outpatient. # Urinary retention, Foley catheter was inserted on 04/16/2022. Started Flomax Continued Foley catheter for 1 week, DC'd Foley on 04/24/2022, passed voiding trial  # Vitamin D deficiency: started vitamin D 50,000 units p.o. weekly, follow with PCP to repeat vitamin D level after 3 to 6 months. # Obesity, body mass  index is 36.48 kg/m.  Nutrition Interventions: Calorie restricted diet and daily exercise advised to lose body weight.    Patient was seen by physical therapy, who recommended Home health, which was arranged. On the day of the discharge the patient's vitals were stable, and no other acute medical condition were reported by patient. the patient was felt safe to be discharge at Home with Home health.  Consultants: Nephrology, cardiology, pulmonologist, Procedures: s/p left chest tube insertion due to recurrent pleural effusion  Discharge Exam: General: Appear in no distress, no Rash; Oral Mucosa Clear, moist. Cardiovascular: S1 and S2 Present, no Murmur, Respiratory: normal respiratory effort, Bilateral Air entry equal, mild bibasilar crackles, no wheezing appreciated.   Abdomen: Bowel Sound present, Soft and no tenderness, no hernia Extremities: 2+ Pedal edema, no calf tenderness Neurology: alert and oriented to time, place, and person affect appropriate.  Filed Weights   04/24/22 0500 04/25/22 0451 04/26/22 0540  Weight: 122.5 kg 121.5 kg 122 kg   Vitals:   04/26/22 0831 04/26/22 1224  BP: 126/63 (!) 119/56  Pulse: 84 81  Resp: 16 16  Temp: 98.2 F (36.8 C) 97.8 F (36.6 C)  SpO2: 98% 100%    DISCHARGE MEDICATION: Allergies as of 04/26/2022       Reactions   Carvedilol Itching, Other (See Comments), Swelling   Pain,  itching and swelling beneath scrotum to anus.   Metoprolol Other (See Comments)   Cant remember Cant remember  Cant remember  Cant remember   Toprol Xl [metoprolol Tartrate] Other (See Comments)   Cant remember   Buprenorphine Itching, Rash   Buprenorphine Hcl Itching, Rash   Morphine Itching, Other (See Comments)   Morphine And Related Itching, Rash, Other (See Comments)   Triamcinolone Rash   Triamcinolone Acetonide Rash        Medication List     STOP taking these medications    amLODipine 5 MG tablet Commonly known as: NORVASC    glipiZIDE 5 MG 24 hr tablet Commonly known as: GLUCOTROL XL   isosorbide mononitrate 30 MG 24 hr tablet Commonly known as: IMDUR   Jardiance 10 MG Tabs tablet Generic drug: empagliflozin   urea 10 % cream Commonly known as: CARMOL   valsartan 320 MG tablet Commonly known as: DIOVAN       TAKE these medications    aspirin EC 81 MG tablet Take 162 mg by mouth daily. Swallow whole.   atorvastatin 80 MG tablet Commonly known as: LIPITOR Take 80 mg by mouth daily.   bisacodyl 5 MG EC tablet Commonly known as: DULCOLAX Take 2 tablets (10 mg total) by mouth at bedtime. Skip the dose if no constipation   Comfort EZ Pen Needles 32G X 8 MM Misc Generic drug: Insulin Pen Needle See admin instructions.   Dexcom G6 Receiver Devi Use to monitor blood sugar. [place] [phone]   Dexcom G6 Sensor Misc Use to monitor blood sugar.  Replace every 10 days. [place] [phone].   Dexcom G6 Transmitter Misc Use to monitor blood sugar.  Replace every 3 months. [place] [phone].   FeroSul 325 (65 FE) MG tablet Generic drug: ferrous sulfate Take by mouth. What changed: Another medication with the same name was removed. Continue taking this medication, and follow the directions you see here.   guaiFENesin 600 MG 12 hr tablet Commonly known as: MUCINEX Take 1 tablet (600 mg total) by mouth 2 (two) times daily for 14 days.   guaiFENesin-dextromethorphan 100-10 MG/5ML syrup Commonly known as: ROBITUSSIN DM Take 5 mLs by mouth every 4 (four) hours as needed for cough (chest congestion).   insulin aspart 100 UNIT/ML injection Commonly known as: novoLOG Inject into the skin 3 (three) times daily before meals.   NovoLOG FlexPen 100 UNIT/ML FlexPen Generic drug: insulin aspart INJ UP TO 50 UNI D [place] IN MULTIPLE INJECTIONS B MEALS UTD   insulin glargine 100 UNIT/ML injection Commonly known as: LANTUS Inject 34 Units into the skin daily. What changed: Another medication with the same  name was removed. Continue taking this medication, and follow the directions you see here.   metFORMIN 500 MG tablet Commonly known as: GLUCOPHAGE Take 1,000 mg by mouth daily with supper.   polyethylene glycol 17 g packet Commonly known as: MIRALAX / GLYCOLAX Take 17 g by mouth daily. Skip the dose if no constipation   spironolactone 25 MG tablet Commonly known as: ALDACTONE Take 50 mg by mouth daily.   tacrolimus 1 MG capsule Commonly known as: PROGRAF Take 1 capsule (1 mg total) by mouth 2 (two) times daily.   tamsulosin 0.4 MG Caps capsule Commonly known as: FLOMAX Take 1 capsule (0.4 mg total) by mouth daily after supper.   torsemide 100 MG tablet Commonly known as: DEMADEX Take 100 mg by mouth daily.   Vitamin D (Ergocalciferol) 1.25 MG (50000 UNIT) Caps capsule  Commonly known as: DRISDOL Take 1 capsule (50,000 Units total) by mouth every 7 (seven) days. Start taking on: May 01, 2022               Durable Medical Equipment  (From admission, onward)           Start     Ordered   04/26/22 1606  For home use only DME Walker rolling  Once       Question Answer Comment  Walker: With 5 Inch Wheels   Patient needs a walker to treat with the following condition SOB (shortness of breath)      04/26/22 1606           Allergies  Allergen Reactions   Carvedilol Itching, Other (See Comments) and Swelling    Pain, itching and swelling beneath scrotum to anus.   Metoprolol Other (See Comments)    Cant remember  Cant remember  Cant remember  Cant remember   Toprol Xl [Metoprolol Tartrate] Other (See Comments)    Cant remember   Buprenorphine Itching and Rash   Buprenorphine Hcl Itching and Rash   Morphine Itching and Other (See Comments)   Morphine And Related Itching, Rash and Other (See Comments)   Triamcinolone Rash   Triamcinolone Acetonide Rash   Discharge Instructions     Call MD for:  difficulty breathing, headache or visual  disturbances   Complete by: As directed    Call MD for:  extreme fatigue   Complete by: As directed    Call MD for:  persistant dizziness or light-headedness   Complete by: As directed    Call MD for:  persistant nausea and vomiting   Complete by: As directed    Call MD for:  severe uncontrolled pain   Complete by: As directed    Call MD for:  temperature >100.4   Complete by: As directed    Diet - low sodium heart healthy   Complete by: As directed    Discharge instructions   Complete by: As directed    Follow-up with PCP in 1 week Follow-up with nephrology and cardiology in 1 week for volume overload management. Follow with pulmonary for PET scan and lung biopsy as an outpatient in 1 to 2 weeks   Increase activity slowly   Complete by: As directed        The results of significant diagnostics from this hospitalization (including imaging, microbiology, ancillary and laboratory) are listed below for reference.    Significant Diagnostic Studies: DG Chest Port 1 View  Result Date: 04/26/2022 CLINICAL DATA:  Atelectasis, left pleural effusion EXAM: PORTABLE CHEST 1 VIEW COMPARISON:  Prior chest x-ray yesterday; CT scan of the chest 04/23/2022 FINDINGS: Persistent left basilar opacity. Stable cardiomegaly. Atherosclerotic calcifications present in the transverse aorta. Lung volumes remain low. No significant interval change in the appearance of the chest. IMPRESSION: No interval change in the appearance of the chest with a persistent left basilar opacity in patient with known left lower lobe mass. Electronically Signed   By: Jacqulynn Cadet M.D.   On: 04/26/2022 09:39   DG Chest Port 1 View  Result Date: 04/25/2022 CLINICAL DATA:  72 year old male with history of left-sided chest tube status post removal. EXAM: PORTABLE CHEST 1 VIEW COMPARISON:  Chest x-ray 04/22/2022. FINDINGS: Previously noted left-sided chest tube has been removed. There is gas in the overlying soft tissues of the  lower left chest wall. No appreciable pneumothorax identified. Trace volume of left pleural fluid.  Opacity at the left base which may reflect atelectasis and/or consolidation. Right lung is clear. No right pleural fluid. No evidence of pulmonary edema. Heart size is normal. Upper mediastinal contours are within normal limits allowing for patient rotation to the left. Atherosclerotic calcifications in the thoracic aorta. IMPRESSION: 1. Status post removal of left-sided chest tube. No pneumothorax. Trace left pleural effusion. 2. Atelectasis and/or consolidation in the left lower lobe. 3. Aortic atherosclerosis. Electronically Signed   By: Vinnie Langton M.D.   On: 04/25/2022 08:41   CT CHEST WO CONTRAST  Result Date: 04/23/2022 CLINICAL DATA:  April 11, 2022 CT of the chest. * Tracking Code: BO * EXAM: CT CHEST WITHOUT CONTRAST TECHNIQUE: Multidetector CT imaging of the chest was performed following the standard protocol without IV contrast. RADIATION DOSE REDUCTION: This exam was performed according to the departmental dose-optimization program which includes automated exposure control, adjustment of the mA and/or kV according to patient size and/or use of iterative reconstruction technique. COMPARISON:  April 11, 2018 for FINDINGS: Cardiovascular: Three-vessel coronary artery disease with extensive coronary calcification. Heart size is enlarged with low-attenuation in cardiac blood pool compatible with anemia. Central pulmonary vasculature is normal caliber. Aorta with scattered atherosclerosis. Limited assessment of cardiovascular structures given lack of intravenous contrast. Mediastinum/Nodes: No signs of adenopathy in the chest with scattered small lymph nodes throughout the chest. No retrocrural adenopathy. Lungs/Pleura: LEFT-sided chest tube in place. Small anterior LEFT pneumothorax. After marked reduction in pleural fluid there is a LEFT lower lobe mass present measuring 6.5 x 4.8 cm (image 107/3)  peripheral consolidative changes are noted in the dependent LEFT chest though there is improved aeration throughout the LEFT lower lobe. Chest tube terminates along the posterior margin of the LEFT heart in the medial LEFT chest just anterior to the LEFT infrahilar region tracking between the lingula and the heart border. Soft tissue nodularity along the pleural surface is evident following reduction in pleural fluid as well is unclear whether this is complex fluid or true soft tissue (image 112/2 area measuring 4.6 x 2.0 cm is contiguous with pleural fluid elsewhere in the chest which is now limited mainly to the dependent posterior LEFT chest. Increased density persists along the margin of collapsed lung dependent LEFT pleural space. Tiny juxta fissural nodule in the RIGHT chest (image 81/2) 5 mm along the minor fissure is nonspecific. Large airways are patent. Upper Abdomen: Incidental imaging of upper abdominal contents show no acute process select showed no acute process shows no acute process. Post cholecystectomy. Musculoskeletal: Subcutaneous emphysema along the LEFT chest wall. No acute bone finding. No destructive bone process. Spinal degenerative changes. IMPRESSION: 1. LEFT-sided chest tube in place. Small anterior LEFT pneumothorax. Chest tube terminates between the lingula and LEFT heart border, tip just anterior to infrahilar region on the LEFT. Correlate with tube function and with desired positioning with repositioning as needed. 2. LEFT lower lobe mass suspicious for pulmonary neoplasm with peripheral postobstructive changes or ill-defined tumor tracking peripheral to the mass. Based on large size PET and/or biopsy is suggested. 3. Increased density in the inferior and medial LEFT pleural space is unchanged and may reflect sequela of prior aspiration of barium are similar process, calcification in this area is also considered. There is slightly more dense nodular appearing pleural surface along the  lateral LEFT chest that could be indicative of malignant effusion. 4. Improved aeration throughout the LEFT lower lobe. 5. Tiny juxta fissural nodule in the RIGHT chest is nonspecific. Attention on follow-up.  6. Three-vessel coronary artery disease with extensive coronary calcification. 7. Aortic atherosclerosis. Aortic Atherosclerosis (ICD10-I70.0). Electronically Signed   By: Zetta Bills M.D.   On: 04/23/2022 09:54   DG Chest Port 1 View  Result Date: 04/22/2022 CLINICAL DATA:  Pleural effusion EXAM: PORTABLE CHEST 1 VIEW COMPARISON:  04/18/2022, CT 04/11/2022, 04/14/2022 FINDINGS: Left chest tube minimally retracted compared to prior. Slightly improved aeration of left lung base with probable decreased left effusion. Enlarged cardiomediastinal silhouette with aortic atherosclerosis. No convincing pneumothorax IMPRESSION: Left chest tube minimally retracted compared to prior. Slightly improved aeration of left lung base with probable decreased left effusion. Electronically Signed   By: Donavan Foil M.D.   On: 04/22/2022 15:28   DG Chest Port 1 View  Result Date: 04/18/2022 CLINICAL DATA:  Follow-up left pleural effusion. EXAM: PORTABLE CHEST 1 VIEW COMPARISON:  04/15/2022 FINDINGS: Left-sided chest tube is in place and appears unchanged in position from previous exam. No pneumothorax identified. Unchanged opacification of the left lower lung which likely represents a combination of pleural effusion, airspace disease and atelectasis. Right lung appears clear IMPRESSION: 1. Stable position of left chest tube. No pneumothorax. 2. No change in aeration to the left lower lung. Electronically Signed   By: Kerby Moors M.D.   On: 04/18/2022 13:28   DG Chest Port 1 View  Result Date: 04/15/2022 CLINICAL DATA:  Left pleural effusion. EXAM: PORTABLE CHEST 1 VIEW COMPARISON:  April 14, 2022. FINDINGS: Stable cardiomegaly. Stable left basilar atelectasis or infiltrate is noted with associated pleural  effusion. Minimal right basilar subsegmental atelectasis is noted. Bony thorax is unremarkable. IMPRESSION: Stable left basilar atelectasis or infiltrate is noted with associated pleural effusion. Minimal right basilar subsegmental atelectasis. Electronically Signed   By: Marijo Conception M.D.   On: 04/15/2022 14:49   DG Chest Port 1 View  Result Date: 04/14/2022 CLINICAL DATA:  Left-sided pleural effusion EXAM: PORTABLE CHEST 1 VIEW COMPARISON:  04/12/2022 FINDINGS: Cardiomegaly. Similar appearance of layering left pleural effusion and associated atelectasis or consolidation. Left-sided chest tube remains in position about the left lung base. No pneumothorax. No new airspace opacity. Osseous structures unremarkable. The visualized skeletal structures are unremarkable. IMPRESSION: 1. Similar appearance of layering left pleural effusion and associated atelectasis or consolidation. Left-sided chest tube remains in position about the left lung base. No pneumothorax. No new airspace opacity. 2. Cardiomegaly. Electronically Signed   By: Delanna Ahmadi M.D.   On: 04/14/2022 12:58   ECHOCARDIOGRAM COMPLETE  Result Date: 04/12/2022    ECHOCARDIOGRAM REPORT   Patient Name:   MAXIMILIAN HILDEBRANDT Date of Exam: 04/12/2022 Medical Rec #:  [id number]      Height:       72.0 in Accession #:    [id number]     Weight:       305.8 lb Date of Birth:  09-05-1950      BSA:          2.552 m Patient Age:    34 years       BP:           136/99 mmHg Patient Gender: M              HR:           101 bpm. Exam Location:  [facility] Procedure: 2D Echo Indications:     CHF I50.21  History:         Patient has no prior history of Echocardiogram examinations.  Sonographer:  Kathlen Brunswick RDCS Referring Phys:  [id number] Sharen Hones Diagnosing Phys: Yolonda Kida MD  Sonographer Comments: Technically challenging study due to limited acoustic windows, patient is obese and suboptimal apical window. Image acquisition challenging due to patient body  habitus. Difficult and challenging study due to left side chest tube and  drain at the time of this study. IMPRESSIONS  1. Left ventricular ejection fraction, by estimation, is 45 to 50%. The left ventricle has mildly decreased function. The left ventricle demonstrates global hypokinesis. The left ventricular internal cavity size was moderately to severely dilated. There  is moderate concentric left ventricular hypertrophy. Left ventricular diastolic parameters are consistent with Grade II diastolic dysfunction (pseudonormalization).  2. Right ventricular systolic function is mildly reduced. The right ventricular size is moderately enlarged. Mildly increased right ventricular wall thickness.  3. Left atrial size was mildly dilated.  4. Right atrial size was mildly dilated.  5. Moderate pleural effusion in the left lateral region.  6. The mitral valve was not well visualized. Trivial mitral valve regurgitation.  7. The aortic valve is grossly normal. Aortic valve regurgitation is not visualized. Aortic valve sclerosis is present, with no evidence of aortic valve stenosis. FINDINGS  Left Ventricle: Left ventricular ejection fraction, by estimation, is 45 to 50%. The left ventricle has mildly decreased function. The left ventricle demonstrates global hypokinesis. The left ventricular internal cavity size was moderately to severely dilated. There is moderate concentric left ventricular hypertrophy. Left ventricular diastolic parameters are consistent with Grade II diastolic dysfunction (pseudonormalization). Right Ventricle: The right ventricular size is moderately enlarged. Mildly increased right ventricular wall thickness. Right ventricular systolic function is mildly reduced. Left Atrium: Left atrial size was mildly dilated. Right Atrium: Right atrial size was mildly dilated. Pericardium: There is no evidence of pericardial effusion. Mitral Valve: The mitral valve was not well visualized. Trivial mitral valve  regurgitation. Tricuspid Valve: The tricuspid valve is not well visualized. Tricuspid valve regurgitation is mild. Aortic Valve: The aortic valve is grossly normal. Aortic valve regurgitation is not visualized. Aortic valve sclerosis is present, with no evidence of aortic valve stenosis. Aortic valve peak gradient measures 3.0 mmHg. Pulmonic Valve: The pulmonic valve was normal in structure. Pulmonic valve regurgitation is not visualized. Aorta: The aortic arch was not well visualized. IAS/Shunts: No atrial level shunt detected by color flow Doppler. Additional Comments: There is a moderate pleural effusion in the left lateral region.  LEFT VENTRICLE PLAX 2D LVIDd:         5.90 cm      Diastology LVIDs:         4.60 cm      LV e' medial:    8.49 cm/s LV PW:         1.50 cm      LV E/e' medial:  13.5 LV IVS:        1.60 cm      LV e' lateral:   8.49 cm/s LVOT diam:     2.30 cm      LV E/e' lateral: 13.5 LV SV:         43 LV SV Index:   17 LVOT Area:     4.15 cm  LV Volumes (MOD) LV vol d, MOD A4C: 254.0 ml LV vol s, MOD A4C: 175.0 ml LV SV MOD A4C:     254.0 ml LEFT ATRIUM           Index LA diam:      4.20 cm 1.65 cm/m  LA Vol (A4C): 43.0 ml 16.85 ml/m  AORTIC VALVE                 PULMONIC VALVE AV Area (Vmax): 2.99 cm     PV Vmax:       0.67 m/s AV Vmax:        86.60 cm/s   PV Peak grad:  1.8 mmHg AV Peak Grad:   3.0 mmHg LVOT Vmax:      62.30 cm/s LVOT Vmean:     41.800 cm/s LVOT VTI:       0.103 m  AORTA Ao Root diam: 3.70 cm Ao Asc diam:  2.90 cm MITRAL VALVE MV Area (PHT): 6.90 cm     SHUNTS MV Decel Time: 110 msec     Systemic VTI:  0.10 m MV E velocity: 115.00 cm/s  Systemic Diam: 2.30 cm MV A velocity: 82.30 cm/s MV E/A ratio:  1.40 Yolonda Kida MD Electronically signed by Yolonda Kida MD Signature Date/Time: 04/12/2022/4:14:41 PM    Final    US Abdomen Complete  Result Date: 04/12/2022 CLINICAL DATA:  Cirrhosis with ascites. EXAM: ABDOMEN ULTRASOUND COMPLETE COMPARISON:  Right upper  quadrant ultrasound on 04/08/2022 FINDINGS: Gallbladder: Surgically absent. Common bile duct: Diameter: 7 mm, within normal limits post cholecystectomy. Liver: Suboptimal visualization due to patient habitus and limited acoustic windows. No focal liver lesion identified. Within normal limits in parenchymal echogenicity. Portal vein is patent on color Doppler imaging with normal direction of blood flow towards the liver. IVC: No abnormality visualized. Pancreas: Not visualized due to patient habitus and bowel gas. Spleen: Could not be visualized due to bandages from left chest tube. Right Kidney: Length: 11.9 cm. Echogenicity within normal limits. No mass or hydronephrosis visualized. Left Kidney: Length: 11.8 cm. Echogenicity within normal limits. No mass or hydronephrosis visualized. Abdominal aorta: Not visualized due to overlying bowel gas. Other findings: No evidence of ascites. IMPRESSION: Technically limited exam due to patient habitus and bandages from chest tube. Prior cholecystectomy.  No evidence of biliary ductal dilatation. No definite hepatic abnormality identified.  No evidence of ascites. Electronically Signed   By: Marlaine Hind M.D.   On: 04/12/2022 12:44   DG Chest Port 1 View  Result Date: 04/12/2022 CLINICAL DATA:  Chest tube in place EXAM: PORTABLE CHEST 1 VIEW COMPARISON:  Chest x-ray April 12, 2022 FINDINGS: A left chest tube is in stable position. Persistent effusion with underlying opacity, similar to mildly worsened in the interval. No pneumothorax. No other changes. IMPRESSION: A left chest tube remains in place. Persistent effusion with underlying opacity, similar to mildly worsened in the interval. No pneumothorax. Electronically Signed   By: Dorise Bullion III M.D.   On: 04/12/2022 08:19   DG Chest Portable 1 View  Result Date: 04/12/2022 CLINICAL DATA:  Post chest tube placement EXAM: PORTABLE CHEST 1 VIEW COMPARISON:  Radiograph and CT 04/11/2022 FINDINGS: Decreased now  small left pleural effusion after left basilar chest tube placement. Decreased left mid and lower lung atelectasis/consolidation. The right lung is clear. No pneumothorax. Stable cardiomediastinal silhouette. Aortic atherosclerotic calcification. IMPRESSION: Decreased now small left pleural effusion after left basilar chest tube placement. Electronically Signed   By: Placido Sou M.D.   On: 04/12/2022 00:31   CT Angio Chest PE W and/or Wo Contrast  Result Date: 04/11/2022 CLINICAL DATA:  Confusion and coughing up blood EXAM: CT ANGIOGRAPHY CHEST WITH CONTRAST TECHNIQUE: Multidetector CT imaging of the chest was performed using the standard protocol  during bolus administration of intravenous contrast. Multiplanar CT image reconstructions and MIPs were obtained to evaluate the vascular anatomy. RADIATION DOSE REDUCTION: This exam was performed according to the departmental dose-optimization program which includes automated exposure control, adjustment of the mA and/or kV according to patient size and/or use of iterative reconstruction technique. CONTRAST:  52m OMNIPAQUE IOHEXOL 350 MG/ML SOLN COMPARISON:  CTA chest 10/15/2019 and radiographs earlier today FINDINGS: Cardiovascular: Satisfactory opacification of the pulmonary arteries to the segmental level. No evidence of pulmonary embolism. Hazy hyperdensity layering along the periphery of the left lower lobe. This is indeterminate on single phase exam and could be due to heterogenous perfusion in the setting of pneumonia however is suspicious for active contrast extravasation possibly related to recent thoracentesis and/or AVM as this appears rounded peripherally (circa series 6/image 255-268). No definite feeding vessel is seen. Cardiomegaly. No pericardial effusion. Coronary artery and aortic atherosclerotic calcification. Mediastinum/Nodes: Shotty mediastinal and hilar lymph nodes have increased since 10/15/2019. The largest is a precarinal node measuring  1.3 cm (4/47). These are favored reactive. Unremarkable esophagus. Lungs/Pleura: Large left pleural effusion some of which is hyperdense posteriorly suspicious for hemothorax. Hazy ground-glass opacities in the superior segment left lower lobe and low-attenuation consolidation in the inferior left lower lobe. These findings may be due to hemorrhage and/or pneumonia. Secretions/fluid within the left mainstem bronchus. The left lower lobe bronchi are not visualized. Secretions/fluid within the lingular bronchi. Atelectasis/consolidation in the lingula. Mild bronchial wall thickening and clustered centrilobular micro nodularity in the right lung. Part solid and subsolid nodule in the right upper lobe measuring 1.0 cm with the solid component measuring 3 mm (6/66). Upper Abdomen: Cholecystectomy.  No acute abnormality. Musculoskeletal: No chest wall abnormality. No acute osseous findings. Review of the MIP images confirms the above findings. IMPRESSION: 1. Findings concerning for active hemorrhage in the left lower lobe possibly related to recent thoracentesis and/or AVM versus heterogenous perfusion in the setting of pneumonia. 2. Large left hemothorax. 3. Ground-glass and low-attenuation consolidation in the left lower lobe suspicious for pneumonia and/or alveolar hemorrhage. 4. Nonspecific secretions or blood within the left mainstem bronchus left lower lobe bronchi and lingula. 5. Part solid and subsolid nodule in the right upper lobe measuring 1.0 cm. Follow-up non-contrast CT recommended at 3-6 months to confirm persistence. If unchanged, and solid component remains <6 mm, annual CT is recommended until 5 years of stability has been established. If persistent these nodules should be considered highly suspicious if the solid component of the nodule is 6 mm or greater in size and enlarging. This recommendation follows the consensus statement: Guidelines for Management of Incidental Pulmonary Nodules Detected on CT  Images: From the Fleischner Society 2017; Radiology 2017; 284:228-243. Critical Value/emergent results were called by telephone at the time of interpretation on 04/11/2022 at 10:34 pm to provider PHILLIP STAFFORD , who verbally acknowledged these results. Electronically Signed   By: TPlacido SouM.D.   On: 04/11/2022 22:50   DG Chest Portable 1 View  Result Date: 04/11/2022 CLINICAL DATA:  Shortness of breath and decreased left breath sounds EXAM: PORTABLE CHEST 1 VIEW COMPARISON:  Radiograph 04/02/2022 FINDINGS: Similar to increased large left pleural effusion and associated atelectasis/consolidation. The right lung is clear. No pneumothorax. Stable partially obscured cardiomediastinal silhouette. Aortic atherosclerotic calcification. IMPRESSION: Slightly increased large left pleural effusion and associated atelectasis/consolidation. Electronically Signed   By: TPlacido SouM.D.   On: 04/11/2022 20:51   [place]Abdomen Limited RUQ (LIVER/GB)  Result Date: 04/08/2022 CLINICAL  DATA:  Cirrhosis EXAM: ULTRASOUND ABDOMEN LIMITED RIGHT UPPER QUADRANT COMPARISON:  CT abdomen and pelvis April 07, 2011, renal ultrasound January 30, 2022 FINDINGS: Gallbladder: Surgically absent. Common bile duct: Diameter: 5.5 mm Liver: No focal lesion identified. Within normal limits in parenchymal echogenicity. Portal vein is patent on color Doppler imaging with normal direction of blood flow towards the liver. Other: None. IMPRESSION: No acute abnormality. Electronically Signed   By: Abelardo Diesel M.D.   On: 04/08/2022 09:12   DG Chest Port 1 View  Result Date: 04/02/2022 CLINICAL DATA:  status post thora EXAM: PORTABLE CHEST 1 VIEW COMPARISON:  Chest x-ray 03/24/2022. FINDINGS: Moderate to large left pleural effusion and overlying opacities. No visible pneumothorax. Partially obscured cardiomediastinal silhouette. No acute osseous abnormality. Polyarticular degenerative change. IMPRESSION: Moderate to large left pleural  effusion and overlying opacities. No visible pneumothorax. Electronically Signed   By: Margaretha Sheffield M.D.   On: 04/02/2022 09:29   US THORACENTESIS ASP PLEURAL SPACE W/IMG GUIDE  Result Date: 04/02/2022 INDICATION: Shortness of breath with recurrent left pleural effusion. EXAM: ULTRASOUND GUIDED LEFT THORACENTESIS MEDICATIONS: Local Lidocaine 1% only. COMPLICATIONS: None immediate. PROCEDURE: An ultrasound guided thoracentesis was thoroughly discussed with the patient and questions answered. The benefits, risks, alternatives and complications were also discussed. The patient understands and wishes to proceed with the procedure. Written consent was obtained. Ultrasound was performed to localize and mark an adequate pocket of fluid in the left chest. The area was then prepped and draped in the normal sterile fashion. 1% Lidocaine was used for local anesthesia. Under ultrasound guidance a 19 gauge, 10-cm, Yueh catheter was introduced. Thoracentesis was performed. The catheter was removed and a dressing applied. FINDINGS: A total of approximately 1.5 L of clear yellow colored fluid was removed. Samples were sent to the laboratory as requested by the clinical team. IMPRESSION: Successful ultrasound guided left thoracentesis yielding 1.5 L of pleural fluid. This exam was performed by Tsosie Billing PA-C, and was supervised and interpreted by Dr. Annamaria Boots. Electronically Signed   By: Jerilynn Mages.  Shick M.D.   On: 04/02/2022 09:09    Microbiology: No results found for this or any previous visit (from the past 240 hour(s)).   Labs: CBC: Recent Labs  Lab 04/22/22 0547 04/23/22 0226 04/24/22 0554 04/25/22 0306 04/26/22 0559  WBC 6.9 6.9 7.1 7.9 7.2  HGB 8.3* 8.0* 7.9* 7.7* 7.6*  HCT 26.2* 25.4* 24.5* 24.6* 24.3*  MCV 89.1 87.9 87.5 89.8 89.0  PLT 269 279 275 [id number]   Basic Metabolic Panel: Recent Labs  Lab 04/22/22 0547 04/23/22 0226 04/24/22 0554 04/25/22 0306 04/26/22 0559  NA 139 137 135 132* 136  K  3.9 3.7 3.9 4.0 4.1  CL 100 99 97* 94* 99  CO2 32 31 30 31 30  $ GLUCOSE 232* 193* 162* 210* 185*  BUN 41* 38* 41* 41* 40*  CREATININE 1.29* 1.32* 1.56* 1.72* 1.41*  CALCIUM 8.0* 7.9* 7.8* 7.8* 8.0*  MG  --  1.5* 2.1 1.7 1.7  PHOS  --  3.0 3.5 3.9 3.5   Liver Function Tests: No results for input(s): "AST", "ALT", "ALKPHOS", "BILITOT", "PROT", "ALBUMIN" in the last 168 hours. No results for input(s): "LIPASE", "AMYLASE" in the last 168 hours. No results for input(s): "AMMONIA" in the last 168 hours. Cardiac Enzymes: No results for input(s): "CKTOTAL", "CKMB", "CKMBINDEX", "TROPONINI" in the last 168 hours. BNP (last 3 results) Recent Labs    04/11/22 2033  BNP 1,319.6*   CBG: Recent Labs  Lab  04/25/22 1139 04/25/22 1706 04/25/22 2040 04/26/22 0831 04/26/22 1224  GLUCAP 284* 257* 279* 163* 281*    Time spent: 35 minutes  Signed:  Val Riles  [facility] 04/26/2022 4:10 PM

## 2022-04-26 NOTE — Progress Notes (Addendum)
Physical Therapy Treatment Patient Details Name: [name] MRN: [id number] DOB: 06-14-1950 Today's Date: 04/26/2022   History of Present Illness Pt is a 72 y.o. male with medical history significant of arthritis, CHF, CAD, DM2, HLD, HTN, treated melanoma, and spinal stenosis who presents for worsening cough, fatigue and SOB. MD assessment includes: Influenza A, hypoxic respiratory failure, hemothorax s/p L chest tube placement, AKI, acute on chronic anemia, hyperglycemia, type 2 NSTEMI related to hemothorax, blood loss, and tachycardia, and edema of the LE.    PT Comments    Pt received in chair agreeable ot PT interventions. Pt reported he went for a walk with nurse, " I need one person because of the room here are too tight. I want to go home and not rehab if OK." Pt transferred from chair  with SBA, ambulated in the hallways with FWW with min guard to Sup. AROM to BLE and PT advised pt to do the same exs every 2 hours for endurance and strengthening. Pt has 2 STE his home and when asked to attempt refused stating he did not want ot over do it in one day. Pt will benefit form stair training prior to D/C. PT communicated with the MD regarding pt's wish to return home. Pt is showing good progress toward his goals. PT will continue in acute. Pt will benefit from HHPT and [place] aide, FWW and BSC.  Addendum: PT attempted to see the pt for stair training and pt agreed. Pt ascended and descended 3 steps with Min Guard to sup of  and is safe.   Recommendations for follow up therapy are one component of a multi-disciplinary discharge planning process, led by the attending physician.  Recommendations may be updated based on patient status, additional functional criteria and insurance authorization.  Follow Up Recommendations  Home health PT Can patient physically be transported by private vehicle: Yes   Assistance Recommended at Discharge Set up Supervision/Assistance  Patient can return home with the  following A little help with walking and/or transfers;A little help with bathing/dressing/bathroom;Assistance with cooking/housework;Assist for transportation;Help with stairs or ramp for entrance   Equipment Recommendations  Rolling walker (2 wheels);BSC/3in1    Recommendations for Other Services       Precautions / Restrictions Precautions Precautions: Fall Restrictions Weight Bearing Restrictions: No     Mobility  Bed Mobility               General bed mobility comments: received in chair    Transfers Overall transfer level: Needs assistance Equipment used: Rolling walker (2 wheels) Transfers: Sit to/from Stand Sit to Stand: Supervision                Ambulation/Gait Ambulation/Gait assistance: Min guard, Supervision Gait Distance (Feet): 112 Feet Assistive device: Rolling walker (2 wheels) Gait Pattern/deviations: Step-through pattern, Decreased stride length Gait velocity: dec     General Gait Details: pt steady with improved velocity   Stairs: Pt refused             Wheelchair Mobility    Modified Rankin (Stroke Patients Only)       Balance Overall balance assessment: Needs assistance Sitting-balance support: Feet supported Sitting balance-Leahy Scale: Normal     Standing balance support: Bilateral upper extremity supported Standing balance-Leahy Scale: Good                              Cognition Arousal/Alertness: Awake/alert Behavior During Therapy: WFL for  tasks assessed/performed Overall Cognitive Status: Within Functional Limits for tasks assessed                                          Exercises Total Joint Exercises Ankle Circles/Pumps: AROM, 20 reps, Both, Seated Long Arc Quad: AROM, Both, 20 reps, Seated General Exercises - Lower Extremity Hip Flexion/Marching: AROM, 20 reps, Seated Other Exercises Other Exercises: same as above exs every 2 hours. Other Exercises: Nurse ambulates  with pt in the hallway.    General Comments        Pertinent Vitals/Pain Pain Assessment Pain Assessment: No/denies pain    Home Living                          Prior Function            PT Goals (current goals can now be found in the care plan section) Acute Rehab PT Goals Patient Stated Goal: To get stronger PT Goal Formulation: With patient Time For Goal Achievement: 04/28/22 Potential to Achieve Goals: Good Progress towards PT goals: Progressing toward goals    Frequency    Min 2X/week      PT Plan Discharge plan needs to be updated    Co-evaluation              AM-PAC PT "6 Clicks" Mobility   Outcome Measure  Help needed turning from your back to your side while in a flat bed without using bedrails?: None Help needed moving from lying on your back to sitting on the side of a flat bed without using bedrails?: None Help needed moving to and from a bed to a chair (including a wheelchair)?: None Help needed standing up from a chair using your arms (e.g., wheelchair or bedside chair)?: A Little Help needed to walk in hospital room?: A Little Help needed climbing 3-5 steps with a railing? : A Little 6 Click Score: 21    End of Session Equipment Utilized During Treatment: Gait belt Activity Tolerance: Patient tolerated treatment well   Nurse Communication: Mobility status PT Visit Diagnosis: Muscle weakness (generalized) (M62.81);Other abnormalities of gait and mobility (R26.89)     Time: 0252-0312 PT Time Calculation (min) (ACUTE ONLY): 20 min  Charges:  $Gait Training: 8-22 mins                    Roch Quach PT DPT 3:30 PM,04/26/22

## 2022-04-26 NOTE — Progress Notes (Addendum)
PULMONOLOGY         Date: 04/26/2022,   MRN# [id number] AMRIT NEVELLS [birth year]     AdmissionWeight: (!) 138.7 kg                 CurrentWeight: 122 kg  Referring provider: Dr Roosevelt Locks   CHIEF COMPLAINT:   Parapneumonic effusion s/p chest tube placement    HISTORY OF PRESENT ILLNESS   This is a pleasant 72 year old male with severe morbid obesity chronic 3+ weeping lower extremity edema due to CKD with Membranous glomerulonephritis disease and recent evaluation with nephrology for rituximab infusion, history of CHF with reduced EF at 40%, osteoarthritis, diabetes, history of melanoma, neuropathy, spinal stenosis with chief complaint of shortness of breath and chest discomfort.  Patient is well-known to our pulmonology service with [facility] clinic we recently saw him for acute on chronic hypoxemic respiratory failure noted to have left-sided large effusion and right-sided mild to moderate effusion.  Status postthoracentesis x 2 with over 1 L drained.  This is a third time that he is shown to have a large pleural effusion in stent 3 months.  He did receive pleural drain upon arrival to the ER with 1500 cc serosanguineous return overnight and active drainage this a.m. and up to 150 cc before lunchtime.  On interview and physical exam patient reports slight improvement mild chest discomfort at pigtail insertion site.  Chest tube management displays no airleak upon forced expiratory maneuver.   04/14/22- patient examined at bedside, s/p repeat CBC with severe anemia requiring PRBC transfusion. S/p [place] abd no ascites. Chest tube in place on left , persistent and recurrent effusion present on CXR 1/28.  Will repeat CXR again today.  On Lasix gtt with 600cc urine overnight. Development of transient hypotension with req for midodrine.   04/15/22-  patient reports nausea. He is on oxycodone.  He appears less swollen today. Overall he is net 1.3L negative and has active fluid per Chest  tube. He needs to start using incentive spirometer today.    04/16/22- patient had 150cc this am per chest tube.  Patient reports improvement clinically.  Continue chest tube for now.   04/17/22- patient on 2L/min nasal canula.  He still has straw colored phlegm returning from chest tube.  He coughed up phlegm with blood/rusty colored. He had obstructiive urophathy and had foley placed feels a lot better now. He is constipated but did have BM today shares he is much improved.  04/18/22- patient is resting in bed.  Still has straw colored return overnight >250cc which will likely continue.  He had cardiology DR [name] come to evaluate him today.   Renal function seems to be improving and I will repeat cxr today for interval effusion changes.  04/19/22- patient aspirated today and was coughing a lot during my evaluation.  Have ordered extra neb treatment.  He is on 2L/min [place].  Overnight >100cc return to chest tube, still unable to come out.  Have ordered physical therapy.   04/20/22- patient with <50cc overnight from chest tube. No air leak noted. He is with nonlabored breathing. He is on 2L/min [place] but using long cordage and is only receiving 1L to nares.  He's urinating well on aldactone 50/demadex 100 daily with Net negative 13L.  Renal function is improved. He's able to ambulate now.   04/21/22- patient is feeling better, he ws able to get up but not walk.  He had another 200cc output from chest tube.  Anemia is improved and renal function has improved, patient making good urine output. He is on PRN dilaudid and oxycodone, I recommend to stop both of those if possible.    04/22/22- patient had further improvement in renal function with FGR now 59, still he is making daily effusion per chest tube >100cc.  Id like to remove pigtail but fluid is actively being produced.  He is walking around and feeling much improved. He is on room air.    04/23/22- patient got up today walked around without dyspnea on room air. He  still is putting out fluid from chest tube.  We did CT chest today and noted a possible tumor that was obscured by overlying fluid.  I have asked pathology to review the pleural fluid in atrium with cell block for atypical cells, there is concern for bronchogenic carcinoma. He had foley removed today.   04/24/22- patient had no cancer cells in pleural fluid. We will likely need to schedule lung biopsy of mass when able.  IR consult for chest tube removal.   04/25/22- patient is improved, had <40cc output from chest tube.  He is on room air and wishes to go home.  I discussed possible pleurX catheter placement for him for palliative fluid management but he wishes to wait to see how he does for a while.  His renal impairment is much improved.   He has Left lower lobe lung mass and will need PET scan and likely lung biopsy this will need to be done on outpatient post DC home.  He had CXR post chest tube removal with improved lung aeration compared to before chest tube.  04/26/22- patient seen and evaluated at bedside.  CXR this am in process. Bloodwork overnight no leukocytosis, stable chronic anemia. Renal function continues to improve with CKD.  He apparently needs rehab and is being evaluated for admission. He is cleared from pulmonary. Plan for PET scan and lung biopsy on outpatient.   PAST MEDICAL HISTORY   Past Medical History:  Diagnosis Date   Arthritis    CHF (congestive heart failure), NYHA class II, acute on chronic, systolic (HCC)    EF [id number]   Coronary artery disease    [facility]   Diabetes mellitus without complication ([place])    Dysplastic nevus 08/26/2020   L flank, moderate atypia   Hyperlipidemia    Hypertension    Melanoma ([place]) 08/20/2015   Right neck. Superficial spreading, arising in nevus. Tumor thickness 1.63m, Anatomic level IV   Melanoma in situ ([place] 05/22/2020   R lateral neck ant to scar, exc 05/22/20   Numbness in right leg    due to back    Skin cancer    removed l arm   Spinal stenosis    Stented coronary artery 2000   X 1 STENT   Swelling of both lower extremities      SURGICAL HISTORY   Past Surgical History:  Procedure Laterality Date   CATARACT EXTRACTION W/PHACO Right 05/03/2019   Procedure: CATARACT EXTRACTION PHACO AND INTRAOCULAR LENS PLACEMENT (IOC) RIGHT DIABETIC 6.44 00:58.4 11.0%;  Surgeon: BLeandrew Koyanagi MD;  [facility]  Service: Ophthalmology;  Laterality: Right;  DIABETIC   CATARACT EXTRACTION W/PHACO Left 06/28/2019   Procedure: CATARACT EXTRACTION PHACO AND INTRAOCULAR LENS PLACEMENT (IOC) LEFT DIABETIC 7.99  01:13.4  10.9% ;  Surgeon: BLeandrew Koyanagi MD;  [facility]  Service: Ophthalmology;  Laterality: Left;  Diabetic - insulin and oral meds  CHOLECYSTECTOMY     JOINT REPLACEMENT     RT KNEE   LUMBAR LAMINECTOMY/DECOMPRESSION MICRODISCECTOMY  04/13/2012   Procedure: LUMBAR LAMINECTOMY/DECOMPRESSION MICRODISCECTOMY 1 LEVEL;  Surgeon: Johnn Hai, MD;  Location: [facility];  Service: Orthopedics;  Laterality: Bilateral;  L4-L5   UVULECTOMY  1990's     FAMILY HISTORY   History reviewed. No pertinent family history.   SOCIAL HISTORY   Social History   Tobacco Use   Smoking status: Never   Smokeless tobacco: Never  Vaping Use   Vaping Use: Never used  Substance Use Topics   Alcohol use: Yes    Comment: rare   Drug use: No     MEDICATIONS    Home Medication:     Current Medication:  Current Facility-Administered Medications:    0.9 %  sodium chloride infusion (Manually program via Guardrails IV Fluids), , Intravenous, Once, Sharen Hones, MD   acetaminophen (TYLENOL) tablet 650 mg, 650 mg, Oral, Q6H PRN **OR** acetaminophen (TYLENOL) suppository 650 mg, 650 mg, Rectal, Q6H PRN, Sid Falcon, MD   atorvastatin (LIPITOR) tablet 80 mg, 80 mg, Oral, Daily, Gilles Chiquito B, MD, 80 mg at 04/25/22 0857   bisacodyl (DULCOLAX) EC tablet  10 mg, 10 mg, Oral, QHS, Val Riles, MD, 10 mg at 04/22/22 2142   bisacodyl (DULCOLAX) suppository 10 mg, 10 mg, Rectal, Daily PRN, Val Riles, MD   Chlorhexidine Gluconate Cloth 2 % PADS 6 each, 6 each, Topical, Daily, Val Riles, MD, 6 each at 04/25/22 0842   enoxaparin (LOVENOX) injection 62.5 mg, 0.5 mg/kg, Subcutaneous, Q24H, Coulter, Carolyn, RPH, 62.5 mg at 04/25/22 2123   guaiFENesin (MUCINEX) 12 hr tablet 600 mg, 600 mg, Oral, BID, Val Riles, MD, 600 mg at 04/25/22 2125   guaiFENesin-dextromethorphan (ROBITUSSIN DM) 100-10 MG/5ML syrup 5 mL, 5 mL, Oral, Q4H PRN, Sid Falcon, MD, 5 mL at 04/13/22 2154   hydrocerin (EUCERIN) cream, , Topical, BID, Val Riles, MD, Given at 04/25/22 2126   insulin aspart (novoLOG) injection 0-15 Units, 0-15 Units, Subcutaneous, TID WC, Val Riles, MD, 8 Units at 04/25/22 1755   insulin aspart (novoLOG) injection 0-5 Units, 0-5 Units, Subcutaneous, QHS, Val Riles, MD, 3 Units at 04/25/22 2124   insulin aspart (novoLOG) injection 8 Units, 8 Units, Subcutaneous, TID WC, Val Riles, MD, 8 Units at 04/25/22 1755   insulin glargine-yfgn (SEMGLEE) injection 34 Units, 34 Units, Subcutaneous, QHS, Val Riles, MD, 34 Units at 04/25/22 2124   ipratropium-albuterol (DUONEB) 0.5-2.5 (3) MG/3ML nebulizer solution 3 mL, 3 mL, Nebulization, Q4H PRN, Val Riles, MD   iron polysaccharides (NIFEREX) capsule 150 mg, 150 mg, Oral, Daily, Val Riles, MD, 150 mg at 04/25/22 0857   methocarbamol (ROBAXIN) 500 mg in dextrose 5 % 50 mL IVPB, 500 mg, Intravenous, Q6H PRN, Sid Falcon, MD   ondansetron (ZOFRAN) tablet 4 mg, 4 mg, Oral, Q6H PRN **OR** ondansetron (ZOFRAN) injection 4 mg, 4 mg, Intravenous, Q6H PRN, Gilles Chiquito B, MD, 4 mg at 04/23/22 1203   oxyCODONE (Oxy IR/ROXICODONE) immediate release tablet 5 mg, 5 mg, Oral, Q6H PRN, Val Riles, MD   polyethylene glycol (MIRALAX / GLYCOLAX) packet 17 g, 17 g, Oral, BID, Val Riles, MD,  17 g at 04/24/22 0917   sodium chloride flush (NS) 0.9 % injection 3 mL, 3 mL, Intravenous, Q12H, Gilles Chiquito B, MD, 3 mL at 04/25/22 2126   spironolactone (ALDACTONE) tablet 50 mg, 50 mg, Oral, Daily, Breeze, Shantelle, NP, 50 mg at 04/25/22 [phone]  tacrolimus (PROGRAF) capsule 1 mg, 1 mg, Oral, BID, Candiss Norse, [place], MD, 1 mg at 04/25/22 2125   tamsulosin (FLOMAX) capsule 0.4 mg, 0.4 mg, Oral, QPC supper, Val Riles, MD, 0.4 mg at 04/25/22 1755   torsemide (DEMADEX) tablet 100 mg, 100 mg, Oral, Daily, Breeze, Shantelle, NP, 100 mg at 04/25/22 [id number]   Vitamin D (Ergocalciferol) (DRISDOL) 1.25 MG (50000 UNIT) capsule 50,000 Units, 50,000 Units, Oral, Q7 days, Val Riles, MD, 50,000 Units at 04/24/22 [id number]    ALLERGIES   Carvedilol, Metoprolol, Toprol xl [metoprolol tartrate], Buprenorphine, Buprenorphine hcl, Morphine, Morphine and related, Triamcinolone, and Triamcinolone acetonide     REVIEW OF SYSTEMS    Review of Systems:  Gen:  Denies  fever, sweats, chills weigh loss  HEENT: Denies blurred vision, double vision, ear pain, eye pain, hearing loss, nose bleeds, sore throat Cardiac:  No dizziness, chest pain or heaviness, chest tightness,edema Resp:   reports dyspnea chronically  Gi: Denies swallowing difficulty, stomach pain, nausea or vomiting, diarrhea, constipation, bowel incontinence Gu:  Denies bladder incontinence, burning urine Ext:   Denies Joint pain, stiffness or swelling Skin: Denies  skin rash, easy bruising or bleeding or hives Endoc:  Denies polyuria, polydipsia , polyphagia or weight change Psych:   Denies depression, insomnia or hallucinations   Other:  All other systems negative   VS: BP 126/63 (BP Location: Right Arm)   Pulse 84   Temp 98.2 F (36.8 C)   Resp 16   Ht 6' (1.829 m)   Wt 122 kg   SpO2 98%   BMI 36.48 kg/m      PHYSICAL EXAM    GENERAL:NAD, no fevers, chills, no weakness no fatigue HEAD: Normocephalic, atraumatic.  EYES:  Pupils equal, round, reactive to light. Extraocular muscles intact. No scleral icterus.  MOUTH: Moist mucosal membrane. Dentition intact. No abscess noted.  EAR, NOSE, THROAT: Clear without exudates. No external lesions.  NECK: Supple. No thyromegaly. No nodules. No JVD.  PULMONARY: decreased breath sounds with mild rhonchi worse at bases bilaterally.  CARDIOVASCULAR: S1 and S2. Regular rate and rhythm. No murmurs, rubs, or gallops. No edema. Pedal pulses 2+ bilaterally.  GASTROINTESTINAL: Soft, nontender, nondistended. No masses. Positive bowel sounds. No hepatosplenomegaly.  MUSCULOSKELETAL: No swelling, clubbing, or edema. Range of motion full in all extremities.  NEUROLOGIC: Cranial nerves II through XII are intact. No gross focal neurological deficits. Sensation intact. Reflexes intact.  SKIN: No ulceration, lesions, rashes, or cyanosis. Skin warm and dry. Turgor intact.  PSYCHIATRIC: Mood, affect within normal limits. The patient is awake, alert and oriented x 3. Insight, judgment intact.       IMAGING     ASSESSMENT/PLAN   Acute on chronic hypoxemic respiratory failure Present on admission due to influenza A infection with recurrent left pleural effusion and associated compressive atelectasis -Patient's evaluated by nephrology and is status post Lasix drip as well as pigtail chest tube on the left with 100CC overnight serosanguineous fluid overnight and active drain during examination today -Is no air leak upon forced aspiratory maneuver, chest tube is not clogged and fluid is freely flowing. -Fluid was previously tested and was noted to be lymphocyte predominant fluid and became exudative upon initiation of diuretics which is known to happen.  Does not appear to be empyema and I do not think he is a very good candidate for intrapleural thrombolytics.  Patient may be heading towards dialysis but if not he may also benefit from thoracic surgery evaluation and possible pleurodesis.  CKD - per nephro    Rutixan plan at some point.    Thank you for allowing me to participate in the care of this patient.   Patient/Family are satisfied with care plan and all questions have been answered.    Provider disclosure: Patient with at least one acute or chronic illness or injury that poses a threat to life or bodily function and is being managed actively during this encounter.  All of the below services have been performed independently by signing provider:  review of prior documentation from internal and or external health records.  Review of previous and current lab results.  Interview and comprehensive assessment during patient visit today. Review of current and previous chest radiographs/CT scans. Discussion of management and test interpretation with health care team and patient/family.   This document was prepared using Dragon voice recognition software and may include unintentional dictation errors.     Ottie Glazier, M.D.  Division of Pulmonary & Critical Care Medicine

## 2022-04-26 NOTE — Progress Notes (Signed)
Pt discharge off unit approx 1800 with all belongings. AVS reviewed with patient and son at bedside. No questions or concerns expressed at that time. PIV and TELE removed.   Inda Castle RN

## 2022-04-26 NOTE — TOC Progression Note (Addendum)
Transition of Care [facility]) - Progression Note    Patient Details  Name: [name] MRN: [id number] Date of Birth: December 26, 1950  [facility]) CM/SW Contact  Izola Price, RN Phone Number: 04/26/2022, 3:52 PM  Clinical Narrative: 2/11: PT evaluation also rec HH. Patient also says he wants to go home and not to rehab.Spoke with patient regarding [facility] agency and Alvis Lemmings has tentatively accepted, but patient wants to confirm with son when he arrives this afternoon. [employer] contacted to deliver [place] to room. RN CM will also reach out to son for review of discharge plan as patient stated he was a bit confused about it. Simmie Davies RN CM      410 pm: Spoke with patient's son and he will arrive around 62-530. Discussed discharge plan with [facility] and ordered DME. Will get BSC at [facility]. 2W walker to be delivered via [employer]. Confirmed with Tommi Rumps at [place] for [facility] services. Simmie Davies RN CM     Barriers to Discharge: Barriers Resolved  Expected Discharge Plan and Services                         DME Arranged: Walker rolling with seat DME Agency: [employer] Date DME Agency Contacted: 04/26/22 Time DME Agency Contacted: [phone] Representative spoke with at DME Agency: [place]: PT, OT, Nurse's Aide [place] Agency: [facility] (Pending final approval after patient speaks to son.) Date [employer]: 04/26/22 Time [place]: [place] Representative spoke with at [place]: [place] ([place]) Interventions SDOH Screenings   Food Insecurity: No Food Insecurity (04/13/2022)  Housing: Low Risk  (04/13/2022)  Transportation Needs: No Transportation Needs (04/13/2022)  Utilities: Not At Risk (04/13/2022)  Depression (PHQ2-9): Low Risk  (02/27/2022)  Recent Concern: Depression (PHQ2-9) - Medium Risk (02/02/2022)  Tobacco Use: Low Risk  (04/13/2022)    Readmission Risk Interventions     No data to display

## 2022-04-26 NOTE — TOC Transition Note (Addendum)
Transition of Care [facility]) - CM/SW Discharge Note   Patient Details  Name: [name] MRN: [id number] Date of Birth: 1950-10-04  Transition of Care [facility]) CM/SW Contact:  Izola Price, RN Phone Number: 04/26/2022, 4:11 PM   Clinical Narrative: 2/11: Patient has discharge orders in. Alvis Lemmings for [facility] services, orders placed, confirmed with Tommi Rumps at [place].  [employer] delivering RW, son will pick up and BSC at [facility] and son will transport to home today. Simmie Davies RN CM       Final next level of care: [place] Barriers to Discharge: Barriers Resolved   Patient Goals and CMS Choice      Discharge Placement                         Discharge Plan and Services Additional resources added to the After Visit Summary for                  DME Arranged: Walker rolling DME Agency: [employer] Date DME Agency Contacted: 04/26/22 Time DME Agency Contacted: [phone] Representative spoke with at DME Agency: [place]: PT, OT, Nurse's Aide [place] Agency:  (Son approved [place] HH services.) Date [place]: 04/26/22 Time [place]: [place] Representative spoke with at [place]: [place] ([place]) Interventions [place]: No Food Insecurity (04/13/2022)  Housing: Low Risk  (04/13/2022)  Transportation Needs: No Transportation Needs (04/13/2022)  Utilities: Not At Risk (04/13/2022)  Depression (PHQ2-9): Low Risk  (02/27/2022)  Recent Concern: Depression (PHQ2-9) - Medium Risk (02/02/2022)  Tobacco Use: Low Risk  (04/13/2022)     Readmission Risk Interventions     No data to display

## 2022-04-26 NOTE — Progress Notes (Signed)
[facility] Progress Note  Patient: [name]    [id number]  DOA: 04/11/2022     Date of Service: the patient was seen and examined on 04/26/2022  Chief Complaint  Patient presents with   Shortness of Breath   Brief hospital course: [name] is a 72 y.o. male with PMH of arthritis, CHF (chronic), CAD, DM2, HLD, HTN, h/o treated melanoma, spinal stenosis who presents for worsening cough, fatigue and sob. Had low-grade fever, he was found to have positive for influenza A. He had recurrent left-sided pleural effusion, had a thoracentesis x 2 during the last months.  Upon arrival emergency room, chest x-ray showed large left-sided pleural effusion again, chest tube was placed by ED physician. Patient also developed significant hypoxemia with oxygen saturation as low as 76% on room air this morning.  Treated with oxygen. Patient has severe volume overload with evidence of bichamber heart failure.  Echocardiogram showed EF 45-50% with grade 2 diastolic dysfunction. Pulm valve not visualized.  Furosemide drip started after seen by nephrology.   1/29.  Patient had minimal urine after Lasix, but chest tube had drained additional 1500 mL in 24 hours.    Assessment and Plan: Acute on chronic combined systolic and diastolic congestive heart failure Acute hypoxemic respiratory failure secondary to volume overload. Elevated troponin with NSTEMI. Coronary artery disease. Supplemental O2 inhalation was gradually weaned off.  Patient presented with volume overload, BNP was elevated.  Biventricular heart failure.  [place] AMB negative for ascites.  Nephrology was also consulted.  S/p IV Lasix infusion, transition to torsemide and Aldactone.  Cardiology was also consulted, patient was on valsartan 150 mg p.o. daily which can be resumed when blood pressure allows. Patient has intolerance to metoprolol and Coreg (itching).  Cardio signed off.  Urine output improved after Foley catheter  insertion.  Recurrent left side pleural effusion.  Chest tube was inserted, fluid was continuously drained, cytology was negative x 2.  Repeat CT chest showed LLL mass suspicious for neoplasm, patient will need PET scan and lung biopsy as an outpatient.  Pulmonary consult appreciated, chest tube was removed on [id number], no complications post removal, no pneumothorax.  Currently patient is on room air.  PleurX catheter placement was discussed with patient for palliative fluid management but patient would like to wait to see if there is reaccumulation of pleural effusion.  Patient was cleared by pulmonary to discharge and follow-up as an outpatient.   Influenza A. S/p Tamiflu for 5 days, last dose on 04/16/22 2/1 started Mucinex 600 mg p.o. twice daily due to chest congestion   Acute kidney injury on chronic kidney disease stage II. Idiopathic membranous glomerulonephritis. Hyponatremia. Patient has a worsening renal function on chronic renal disease.  He is grossly volume overloaded, he is started on Lasix drip.  Renal function is gradually getting worse.  There is a possibility that patient may need a dialysis. Nephrology is working with oncology to start rituximab.  2/4 started tacrolimus 1 mg p.o. twice daily until patient is able to take rituximab 2/11 Creatinine 1.41 stable, continue to monitor   Hypomagnesemia, mag repleted.   Monitor and replete as needed.  Iron deficient anemia. Acute blood loss anemia ruled out. Patient received IV iron, hemoglobin dropped down to 6.9, chest tube fluids does not seem to be bloody.  s/p 1 unit PRBC.  No evidence of GI bleed. Hb 7.2 --7.5--7.8--8.3--8.0--7.6 Continue to monitor H&H and transfuse if hemoglobin less than 7   Uncontrolled type 2  diabetes with hyperglycemia Continue long-acting insulin, sliding scale insulin.   Essential hypertension Continue to monitor.   Morbid obesity BMI 41.47, Obstructive sleep apnea. Calorie restricted diet and  daily exercise advised to lose body weight Patient may need sleep study as an outpatient for CPAP  Urinary retention, Foley catheter was inserted on 04/16/2022 Started Flomax Continued Foley catheter for 1 week, DC'd Foley on 04/24/2022, passed voiding trial  Bladder scan as needed  Vitamin D deficiency: started vitamin D 50,000 units p.o. weekly, follow with PCP to repeat vitamin D level after 3 to 6 months.     Diet: Carb modified diet DVT Prophylaxis: Subcutaneous Lovenox   Advance goals of care discussion: Full code  Family Communication: family was not present at bedside, at the time of interview.  The pt provided permission to discuss medical plan with the family. Opportunity was given to ask question and all questions were answered satisfactorily.   Disposition:  Pt is from Home, admitted with anasarca and left pleural effusion, still has left-sided chest tube, which precludes a safe discharge. Discharge to SNF, when clinically stable, needs pulmonary clearance.  Subjective: No significant events overnight, patient was sitting comfortably on the recliner, shortness of breath and edema is getting better, denies any chest pain or palpitation, no any other active issues.  Chest tube is still intact.   Physical Exam: General: NAD, lying comfortably Appear in no distress, affect appropriate Eyes: PERRLA ENT: Oral Mucosa Clear, moist  Neck: no JVD,  Cardiovascular: S1 and S2 Present, no Murmur,  Respiratory: good respiratory effort, Bilateral Air entry equal, mild bibasilar crackles, no wheezing, left sided chest tube intact Abdomen: Bowel Sound present, Soft and no tenderness,  Skin: no rashes Extremities: 2+ Pedal edema, no calf tenderness.  Edema is gradually improving, dryness improving after Eucerin cream application. Neurologic: without any new focal findings Gait not checked due to patient safety concerns  Vitals:   04/25/22 2043 04/26/22 0540 04/26/22 0831 04/26/22 1224   BP: (!) 116/54 (!) 141/68 126/63 (!) 119/56  Pulse: 83 87 84 81  Resp: 16 16 16 16  $ Temp: 97.8 F (36.6 C) 97.8 F (36.6 C) 98.2 F (36.8 C) 97.8 F (36.6 C)  TempSrc: Oral     SpO2: 100% 96% 98% 100%  Weight:  122 kg    Height:        Intake/Output Summary (Last 24 hours) at 04/26/2022 1414 Last data filed at 04/26/2022 1133 Gross per 24 hour  Intake --  Output 850 ml  Net -850 ml   Filed Weights   04/24/22 0500 04/25/22 0451 04/26/22 0540  Weight: 122.5 kg 121.5 kg 122 kg    Data Reviewed: I have personally reviewed and interpreted daily labs, tele strips, imagings as discussed above. I reviewed all nursing notes, pharmacy notes, vitals, pertinent old records I have discussed plan of care as described above with RN and patient/family.  CBC: Recent Labs  Lab 04/22/22 0547 04/23/22 0226 04/24/22 0554 04/25/22 0306 04/26/22 0559  WBC 6.9 6.9 7.1 7.9 7.2  HGB 8.3* 8.0* 7.9* 7.7* 7.6*  HCT 26.2* 25.4* 24.5* 24.6* 24.3*  MCV 89.1 87.9 87.5 89.8 89.0  PLT 269 279 275 267 [id number]   Basic Metabolic Panel: Recent Labs  Lab 04/22/22 0547 04/23/22 0226 04/24/22 0554 04/25/22 0306 04/26/22 0559  NA 139 137 135 132* 136  K 3.9 3.7 3.9 4.0 4.1  CL 100 99 97* 94* 99  CO2 32 31 30 31 30  $ GLUCOSE 232*  193* 162* 210* 185*  BUN 41* 38* 41* 41* 40*  CREATININE 1.29* 1.32* 1.56* 1.72* 1.41*  CALCIUM 8.0* 7.9* 7.8* 7.8* 8.0*  MG  --  1.5* 2.1 1.7 1.7  PHOS  --  3.0 3.5 3.9 3.5    Studies: DG Chest Port 1 View  Result Date: 04/26/2022 CLINICAL DATA:  Atelectasis, left pleural effusion EXAM: PORTABLE CHEST 1 VIEW COMPARISON:  Prior chest x-ray yesterday; CT scan of the chest 04/23/2022 FINDINGS: Persistent left basilar opacity. Stable cardiomegaly. Atherosclerotic calcifications present in the transverse aorta. Lung volumes remain low. No significant interval change in the appearance of the chest. IMPRESSION: No interval change in the appearance of the chest with a persistent  left basilar opacity in patient with known left lower lobe mass. Electronically Signed   By: Jacqulynn Cadet M.D.   On: 04/26/2022 09:39    Scheduled Meds:  atorvastatin  80 mg Oral Daily   bisacodyl  10 mg Oral QHS   Chlorhexidine Gluconate Cloth  6 each Topical Daily   enoxaparin (LOVENOX) injection  0.5 mg/kg Subcutaneous Q24H   guaiFENesin  600 mg Oral BID   hydrocerin   Topical BID   insulin aspart  0-15 Units Subcutaneous TID WC   insulin aspart  0-5 Units Subcutaneous QHS   insulin aspart  8 Units Subcutaneous TID WC   insulin glargine-yfgn  34 Units Subcutaneous QHS   iron polysaccharides  150 mg Oral Daily   polyethylene glycol  17 g Oral BID   sodium chloride flush  3 mL Intravenous Q12H   spironolactone  50 mg Oral Daily   tacrolimus  1 mg Oral BID   tamsulosin  0.4 mg Oral QPC supper   torsemide  100 mg Oral Daily   Vitamin D (Ergocalciferol)  50,000 Units Oral Q7 days   Continuous Infusions:  methocarbamol (ROBAXIN) IV     PRN Meds: acetaminophen **OR** acetaminophen, bisacodyl, guaiFENesin-dextromethorphan, ipratropium-albuterol, methocarbamol (ROBAXIN) IV, ondansetron **OR** ondansetron (ZOFRAN) IV, oxyCODONE  Time spent: 35 minutes  Author: Val Riles. MD [facility] 04/26/2022 2:14 PM  To reach On-call, see care teams to locate the attending and reach out to them via [email or web address]. If 7PM-7AM, please contact night-coverage If you still have difficulty reaching the attending provider, please page the [facility] (Director on Call) for [facility] on amion for assistance.

## 2022-04-27 ENCOUNTER — Encounter: Payer: Medicare HMO | Admitting: *Deleted

## 2022-04-28 ENCOUNTER — Other Ambulatory Visit: Payer: Self-pay | Admitting: Pulmonary Disease

## 2022-04-28 DIAGNOSIS — N0421 Primary membranous nephropathy with nephrotic syndrome: Secondary | ICD-10-CM | POA: Diagnosis not present

## 2022-04-28 DIAGNOSIS — N2581 Secondary hyperparathyroidism of renal origin: Secondary | ICD-10-CM | POA: Diagnosis not present

## 2022-04-28 DIAGNOSIS — N189 Chronic kidney disease, unspecified: Secondary | ICD-10-CM | POA: Diagnosis not present

## 2022-04-28 DIAGNOSIS — N079 Hereditary nephropathy, not elsewhere classified with unspecified morphologic lesions: Secondary | ICD-10-CM | POA: Diagnosis not present

## 2022-04-28 DIAGNOSIS — R809 Proteinuria, unspecified: Secondary | ICD-10-CM | POA: Diagnosis not present

## 2022-04-28 DIAGNOSIS — R600 Localized edema: Secondary | ICD-10-CM | POA: Diagnosis not present

## 2022-04-28 DIAGNOSIS — R918 Other nonspecific abnormal finding of lung field: Secondary | ICD-10-CM

## 2022-04-28 DIAGNOSIS — E1122 Type 2 diabetes mellitus with diabetic chronic kidney disease: Secondary | ICD-10-CM | POA: Diagnosis not present

## 2022-04-28 DIAGNOSIS — N182 Chronic kidney disease, stage 2 (mild): Secondary | ICD-10-CM | POA: Diagnosis not present

## 2022-04-28 DIAGNOSIS — I10 Essential (primary) hypertension: Secondary | ICD-10-CM | POA: Diagnosis not present

## 2022-04-29 DIAGNOSIS — Z7689 Persons encountering health services in other specified circumstances: Secondary | ICD-10-CM | POA: Diagnosis not present

## 2022-04-29 DIAGNOSIS — N0421 Primary membranous nephropathy with nephrotic syndrome: Secondary | ICD-10-CM | POA: Diagnosis not present

## 2022-04-29 DIAGNOSIS — E1122 Type 2 diabetes mellitus with diabetic chronic kidney disease: Secondary | ICD-10-CM | POA: Diagnosis not present

## 2022-04-29 DIAGNOSIS — R600 Localized edema: Secondary | ICD-10-CM | POA: Diagnosis not present

## 2022-04-29 DIAGNOSIS — N189 Chronic kidney disease, unspecified: Secondary | ICD-10-CM | POA: Diagnosis not present

## 2022-04-30 DIAGNOSIS — I5082 Biventricular heart failure: Secondary | ICD-10-CM | POA: Diagnosis not present

## 2022-04-30 DIAGNOSIS — J9601 Acute respiratory failure with hypoxia: Secondary | ICD-10-CM | POA: Diagnosis not present

## 2022-04-30 DIAGNOSIS — I130 Hypertensive heart and chronic kidney disease with heart failure and stage 1 through stage 4 chronic kidney disease, or unspecified chronic kidney disease: Secondary | ICD-10-CM | POA: Diagnosis not present

## 2022-04-30 DIAGNOSIS — I5043 Acute on chronic combined systolic (congestive) and diastolic (congestive) heart failure: Secondary | ICD-10-CM | POA: Diagnosis not present

## 2022-04-30 DIAGNOSIS — N182 Chronic kidney disease, stage 2 (mild): Secondary | ICD-10-CM | POA: Diagnosis not present

## 2022-04-30 DIAGNOSIS — D631 Anemia in chronic kidney disease: Secondary | ICD-10-CM | POA: Diagnosis not present

## 2022-04-30 DIAGNOSIS — E1122 Type 2 diabetes mellitus with diabetic chronic kidney disease: Secondary | ICD-10-CM | POA: Diagnosis not present

## 2022-04-30 DIAGNOSIS — N179 Acute kidney failure, unspecified: Secondary | ICD-10-CM | POA: Diagnosis not present

## 2022-04-30 DIAGNOSIS — I21A1 Myocardial infarction type 2: Secondary | ICD-10-CM | POA: Diagnosis not present

## 2022-05-01 DIAGNOSIS — I130 Hypertensive heart and chronic kidney disease with heart failure and stage 1 through stage 4 chronic kidney disease, or unspecified chronic kidney disease: Secondary | ICD-10-CM | POA: Diagnosis not present

## 2022-05-01 DIAGNOSIS — I5082 Biventricular heart failure: Secondary | ICD-10-CM | POA: Diagnosis not present

## 2022-05-01 DIAGNOSIS — I21A1 Myocardial infarction type 2: Secondary | ICD-10-CM | POA: Diagnosis not present

## 2022-05-01 DIAGNOSIS — J9601 Acute respiratory failure with hypoxia: Secondary | ICD-10-CM | POA: Diagnosis not present

## 2022-05-01 DIAGNOSIS — N179 Acute kidney failure, unspecified: Secondary | ICD-10-CM | POA: Diagnosis not present

## 2022-05-01 DIAGNOSIS — E1122 Type 2 diabetes mellitus with diabetic chronic kidney disease: Secondary | ICD-10-CM | POA: Diagnosis not present

## 2022-05-01 DIAGNOSIS — I5043 Acute on chronic combined systolic (congestive) and diastolic (congestive) heart failure: Secondary | ICD-10-CM | POA: Diagnosis not present

## 2022-05-01 DIAGNOSIS — N182 Chronic kidney disease, stage 2 (mild): Secondary | ICD-10-CM | POA: Diagnosis not present

## 2022-05-01 DIAGNOSIS — D631 Anemia in chronic kidney disease: Secondary | ICD-10-CM | POA: Diagnosis not present

## 2022-05-04 DIAGNOSIS — E1122 Type 2 diabetes mellitus with diabetic chronic kidney disease: Secondary | ICD-10-CM | POA: Diagnosis not present

## 2022-05-04 DIAGNOSIS — I130 Hypertensive heart and chronic kidney disease with heart failure and stage 1 through stage 4 chronic kidney disease, or unspecified chronic kidney disease: Secondary | ICD-10-CM | POA: Diagnosis not present

## 2022-05-04 DIAGNOSIS — I5082 Biventricular heart failure: Secondary | ICD-10-CM | POA: Diagnosis not present

## 2022-05-04 DIAGNOSIS — J9601 Acute respiratory failure with hypoxia: Secondary | ICD-10-CM | POA: Diagnosis not present

## 2022-05-04 DIAGNOSIS — D631 Anemia in chronic kidney disease: Secondary | ICD-10-CM | POA: Diagnosis not present

## 2022-05-04 DIAGNOSIS — N182 Chronic kidney disease, stage 2 (mild): Secondary | ICD-10-CM | POA: Diagnosis not present

## 2022-05-04 DIAGNOSIS — N179 Acute kidney failure, unspecified: Secondary | ICD-10-CM | POA: Diagnosis not present

## 2022-05-04 DIAGNOSIS — I21A1 Myocardial infarction type 2: Secondary | ICD-10-CM | POA: Diagnosis not present

## 2022-05-04 DIAGNOSIS — I5043 Acute on chronic combined systolic (congestive) and diastolic (congestive) heart failure: Secondary | ICD-10-CM | POA: Diagnosis not present

## 2022-05-04 LAB — FUNGUS CULTURE WITH STAIN

## 2022-05-04 LAB — FUNGAL ORGANISM REFLEX

## 2022-05-04 LAB — FUNGUS CULTURE RESULT

## 2022-05-05 ENCOUNTER — Ambulatory Visit: Payer: Self-pay | Admitting: *Deleted

## 2022-05-05 NOTE — Patient Instructions (Signed)
Visit Information  Thank you for taking time to visit with me today. Please don't hesitate to contact me if I can be of assistance to you before our next scheduled telephone appointment.  Following are the goals we discussed today:  Continue daily weights and monitoring oxygen levels.   Our next appointment is by telephone on 3/21  Please call the care guide team at [phone] if you need to cancel or reschedule your appointment.   Please call the Suicide and Crisis Lifeline: 988 call the [place] National Suicide Prevention Lifeline: (343)194-7106 or TTY: 417-839-3721 TTY 4044085405) to talk to a trained counselor call 1-800-273-TALK (toll free, 24 hour hotline) call 911 if you are experiencing a Mental Health or [place] or need someone to talk to.  Patient verbalizes understanding of instructions and care plan provided today and agrees to view in [place]. Active MyChart status and patient understanding of how to access instructions and care plan via MyChart confirmed with patient.     The patient has been provided with contact information for the care management team and has been advised to call with any health related questions or concerns.   [place] [facility] Care Management Coordinator [phone]

## 2022-05-05 NOTE — Patient Outreach (Signed)
  Care Coordination   Follow Up Visit Note   05/05/2022 Name: [name] MRN: [id number] DOB: 1951/01/30  [name] is a 72 y.o. year old male who sees Juluis Pitch, MD for primary care. I spoke with  [name] by phone today.  What matters to the patients health and wellness today?  Continue breathing better and keep fluid off lungs.      Goals Addressed             This Visit's Progress    Effective management of CHF   On track    Care Coordination Interventions: Basic overview and discussion of pathophysiology of Heart Failure reviewed Provided education on low sodium diet Reviewed Heart Failure Action Plan in depth and provided written copy Assessed need for readable accurate scales in home Provided education about placing scale on hard, flat surface Discussed importance of daily weight and advised patient to weigh and record daily Reviewed role of diuretics in prevention of fluid overload and management of heart failure; Discussed the importance of keeping all appointments with provider Report he has swelling, better.  During appointment with vascular, state he will be fitted for leg wraps if needed to help with swelling. Weight down to 275 pounds Follows up with oncology on 3/8, Pulmonology on 3/1 for bronchoscopy and biopsy of reported non-cancerous lung mass, vascular 2/22, PCP on 3/15, cardiology on 4/2.  Will have PET scan on 2/27 Now active with pulmonary/cardiac rehab, had not been in a while due to shortness of breath, denies any current shortness of breath.   Report oxygen levels in the high 90s.          SDOH assessments and interventions completed:  No     Care Coordination Interventions:  Yes, provided   Follow up plan: Follow up call scheduled for 3/21    Encounter Outcome:  Pt. Visit Completed   Valente David, RN, MSN, [place] [facility] Care Management Coordinator [phone]

## 2022-05-06 ENCOUNTER — Encounter: Payer: Self-pay | Admitting: *Deleted

## 2022-05-06 ENCOUNTER — Other Ambulatory Visit (INDEPENDENT_AMBULATORY_CARE_PROVIDER_SITE_OTHER): Payer: Self-pay | Admitting: Vascular Surgery

## 2022-05-06 DIAGNOSIS — M79669 Pain in unspecified lower leg: Secondary | ICD-10-CM

## 2022-05-06 DIAGNOSIS — I5022 Chronic systolic (congestive) heart failure: Secondary | ICD-10-CM

## 2022-05-06 DIAGNOSIS — I21A1 Myocardial infarction type 2: Secondary | ICD-10-CM | POA: Diagnosis not present

## 2022-05-06 DIAGNOSIS — I5082 Biventricular heart failure: Secondary | ICD-10-CM | POA: Diagnosis not present

## 2022-05-06 DIAGNOSIS — N182 Chronic kidney disease, stage 2 (mild): Secondary | ICD-10-CM | POA: Diagnosis not present

## 2022-05-06 DIAGNOSIS — N179 Acute kidney failure, unspecified: Secondary | ICD-10-CM | POA: Diagnosis not present

## 2022-05-06 DIAGNOSIS — E1122 Type 2 diabetes mellitus with diabetic chronic kidney disease: Secondary | ICD-10-CM | POA: Diagnosis not present

## 2022-05-06 DIAGNOSIS — I5043 Acute on chronic combined systolic (congestive) and diastolic (congestive) heart failure: Secondary | ICD-10-CM | POA: Diagnosis not present

## 2022-05-06 DIAGNOSIS — J9601 Acute respiratory failure with hypoxia: Secondary | ICD-10-CM | POA: Diagnosis not present

## 2022-05-06 DIAGNOSIS — I130 Hypertensive heart and chronic kidney disease with heart failure and stage 1 through stage 4 chronic kidney disease, or unspecified chronic kidney disease: Secondary | ICD-10-CM | POA: Diagnosis not present

## 2022-05-06 DIAGNOSIS — D631 Anemia in chronic kidney disease: Secondary | ICD-10-CM | POA: Diagnosis not present

## 2022-05-06 NOTE — Progress Notes (Signed)
Pulmonary Individual Treatment Plan  Patient Details  Name: [name] MRN: [id number] Date of Birth: 10-Jul-1950 Referring Provider:   Flowsheet Row Pulmonary Rehab from 02/02/2022 in [facility] Cardiac and Pulmonary Rehab  Referring Provider [name]       Initial Encounter Date:  Flowsheet Row Pulmonary Rehab from 02/02/2022 in [facility] Cardiac and Pulmonary Rehab  Date 02/02/22       Visit Diagnosis: Heart failure, chronic systolic ([place])  Patient's Home Medications on Admission:  Current Outpatient Medications:    aspirin EC 81 MG tablet, Take 162 mg by mouth daily. Swallow whole., Disp: , Rfl:    atorvastatin (LIPITOR) 80 MG tablet, Take 80 mg by mouth daily., Disp: , Rfl:    bisacodyl (DULCOLAX) 5 MG EC tablet, Take 2 tablets (10 mg total) by mouth at bedtime. Skip the dose if no constipation, Disp: 30 tablet, Rfl: 0   Continuous Blood Gluc Receiver (DEXCOM G6 RECEIVER) DEVI, Use to monitor blood sugar. [place] [phone], Disp: , Rfl:    Continuous Blood Gluc Sensor (DEXCOM G6 SENSOR) MISC, Use to monitor blood sugar.  Replace every 10 days. [place] [id number]., Disp: , Rfl:    Continuous Blood Gluc Transmit (DEXCOM G6 TRANSMITTER) MISC, Use to monitor blood sugar.  Replace every 3 months. [place] [phone]., Disp: , Rfl:    ferrous sulfate (FEROSUL) 325 (65 FE) MG tablet, Take by mouth., Disp: , Rfl:    guaiFENesin (MUCINEX) 600 MG 12 hr tablet, Take 1 tablet (600 mg total) by mouth 2 (two) times daily for 14 days., Disp: 28 tablet, Rfl: 0   guaiFENesin-dextromethorphan (ROBITUSSIN DM) 100-10 MG/5ML syrup, Take 5 mLs by mouth every 4 (four) hours as needed for cough (chest congestion)., Disp: 118 mL, Rfl: 0   insulin aspart (NOVOLOG) 100 UNIT/ML injection, Inject into the skin 3 (three) times daily before meals., Disp: , Rfl:    insulin glargine (LANTUS) 100 UNIT/ML injection, Inject 34 Units into the skin daily., Disp: , Rfl:    Insulin Pen Needle (COMFORT EZ PEN NEEDLES) 32G X 8 MM  MISC, See admin instructions., Disp: , Rfl:    metFORMIN (GLUCOPHAGE) 500 MG tablet, Take 1,000 mg by mouth daily with supper., Disp: , Rfl:    NOVOLOG FLEXPEN 100 UNIT/ML FlexPen, INJ UP TO 50 UNI D  IN MULTIPLE INJECTIONS B MEALS UTD, Disp: , Rfl:    polyethylene glycol (MIRALAX / GLYCOLAX) 17 g packet, Take 17 g by mouth daily. Skip the dose if no constipation, Disp: 30 packet, Rfl: 0   spironolactone (ALDACTONE) 25 MG tablet, Take 50 mg by mouth daily., Disp: , Rfl:    tacrolimus (PROGRAF) 1 MG capsule, Take 1 capsule (1 mg total) by mouth 2 (two) times daily., Disp: 60 capsule, Rfl: 11   tamsulosin (FLOMAX) 0.4 MG CAPS capsule, Take 1 capsule (0.4 mg total) by mouth daily after supper., Disp: 30 capsule, Rfl: 11   torsemide (DEMADEX) 100 MG tablet, Take 100 mg by mouth daily., Disp: , Rfl:    Vitamin D, Ergocalciferol, (DRISDOL) 1.25 MG (50000 UNIT) CAPS capsule, Take 1 capsule (50,000 Units total) by mouth every 7 (seven) days., Disp: 12 capsule, Rfl: 0  Past Medical History: Past Medical History:  Diagnosis Date   Arthritis    CHF (congestive heart failure), NYHA class II, acute on chronic, systolic (HCC)    EF [id number]   Coronary artery disease    [facility]   Diabetes mellitus without complication ([place])    Dysplastic nevus  08/26/2020   L flank, moderate atypia   Hyperlipidemia    Hypertension    Melanoma ([place]) 08/20/2015   Right neck. Superficial spreading, arising in nevus. Tumor thickness 1.27m, Anatomic level IV   Melanoma in situ ([place] 05/22/2020   R lateral neck ant to scar, exc 05/22/20   Numbness in right leg    due to back   Skin cancer    removed l arm   Spinal stenosis    Stented coronary artery 2000   X 1 STENT   Swelling of both lower extremities     Tobacco Use: Social History   Tobacco Use  Smoking Status Never  Smokeless Tobacco Never    Labs: Review Flowsheet       Latest Ref Rng & Units 09/01/2006 03/31/2011 04/13/2012  04/12/2022  Labs for ITP Cardiac and Pulmonary Rehab  Hemoglobin A1c 4.8 - 5.6 % 9.6 (NOTE)   The ADA recommends the following therapeutic goals for glycemic   control related to Hgb A1C measurement:   Goal of Therapy:   < 7.0% Hgb A1C   Action Suggested:  > 8.0% Hgb A1C   Ref:  Diabetes Care, 22, Suppl. 1, 1999  11.2  8.1  5.9      Pulmonary Assessment Scores:  Pulmonary Assessment Scores     Row Name 02/02/22 1722         ADL UCSD   ADL Phase Entry     SOB Score total 58     Rest 0     Walk 2     Stairs 5     Bath 1     Dress 2     Shop 1       CAT Score   CAT Score 25       mMRC Score   mMRC Score 4              UCSD: Self-administered rating of dyspnea associated with activities of daily living (ADLs) 6-point scale (0 = "not at all" to 5 = "maximal or unable to do because of breathlessness")  Scoring Scores range from 0 to 120.  Minimally important difference is 5 units  CAT: CAT can identify the health impairment of COPD patients and is better correlated with disease progression.  CAT has a scoring range of zero to 40. The CAT score is classified into four groups of low (less than 10), medium (10 - 20), high (21-30) and very high (31-40) based on the impact level of disease on health status. A CAT score over 10 suggests significant symptoms.  A worsening CAT score could be explained by an exacerbation, poor medication adherence, poor inhaler technique, or progression of COPD or comorbid conditions.  CAT MCID is 2 points  mMRC: mMRC (Modified Medical Research Council) Dyspnea Scale is used to assess the degree of baseline functional disability in patients of respiratory disease due to dyspnea. No minimal important difference is established. A decrease in score of 1 point or greater is considered a positive change.   Pulmonary Function Assessment:  Pulmonary Function Assessment - 01/26/22 1111       Breath   Shortness of Breath Yes;Limiting activity              Exercise Target Goals: Exercise Program Goal: Individual exercise prescription set using results from initial 6 min walk test and THRR while considering  patient's activity barriers and safety.   Exercise Prescription Goal: Initial exercise prescription builds to 30-45 minutes a  day of aerobic activity, 2-3 days per week.  Home exercise guidelines will be given to patient during program as part of exercise prescription that the participant will acknowledge.  Education: Aerobic Exercise: - Group verbal and visual presentation on the components of exercise prescription. Introduces F.I.T.T principle from ACSM for exercise prescriptions.  Reviews F.I.T.T. principles of aerobic exercise including progression. Written material given at graduation.   Education: Resistance Exercise: - Group verbal and visual presentation on the components of exercise prescription. Introduces F.I.T.T principle from ACSM for exercise prescriptions  Reviews F.I.T.T. principles of resistance exercise including progression. Written material given at graduation.    Education: Exercise & Equipment Safety: - Individual verbal instruction and demonstration of equipment use and safety with use of the equipment. Flowsheet Row Pulmonary Rehab from 02/25/2022 in [facility] Cardiac and Pulmonary Rehab  Date 02/02/22  Educator [facility]  Instruction Review Code 1- Verbalizes Understanding       Education: Exercise Physiology & General Exercise Guidelines: - Group verbal and written instruction with models to review the exercise physiology of the cardiovascular system and associated critical values. Provides general exercise guidelines with specific guidelines to those with heart or lung disease.    Education: Flexibility, Balance, Mind/Body Relaxation: - Group verbal and visual presentation with interactive activity on the components of exercise prescription. Introduces F.I.T.T principle from ACSM for exercise prescriptions.  Reviews F.I.T.T. principles of flexibility and balance exercise training including progression. Also discusses the mind body connection.  Reviews various relaxation techniques to help reduce and manage stress (i.e. Deep breathing, progressive muscle relaxation, and visualization). Balance handout provided to take home. Written material given at graduation.   Activity Barriers & Risk Stratification:   6 Minute Walk:  6 Minute Walk     Row Name 02/02/22 1701         6 Minute Walk   Phase Initial     Distance 480 feet     Walk Time 5.94 minutes     # of Rest Breaks 2     MPH 0.91     METS 0.8     RPE 15     Perceived Dyspnea  3     VO2 Peak 3     Symptoms No     Resting HR 96 bpm     Resting BP 158/90     Resting Oxygen Saturation  94 %     Exercise Oxygen Saturation  during 6 min walk 90 %     Max Ex. HR 120 bpm     Max Ex. BP 160/80     2 Minute Post BP 140/70       Interval HR   1 Minute HR 98     2 Minute HR 110     3 Minute HR 112     4 Minute HR 118     5 Minute HR 119     6 Minute HR 120     2 Minute Post HR 100     Interval Heart Rate? Yes       Interval Oxygen   Interval Oxygen? Yes     Baseline Oxygen Saturation % 94 %     1 Minute Oxygen Saturation % 90 %     1 Minute Liters of Oxygen 0 L     2 Minute Oxygen Saturation % 90 %     2 Minute Liters of Oxygen 0 L     3 Minute Oxygen Saturation % 91 %  3 Minute Liters of Oxygen 0 L     4 Minute Oxygen Saturation % 92 %     4 Minute Liters of Oxygen 0 L     5 Minute Oxygen Saturation % 92 %     5 Minute Liters of Oxygen 0 L     6 Minute Oxygen Saturation % 93 %     6 Minute Liters of Oxygen 0 L     2 Minute Post Oxygen Saturation % 98 %     2 Minute Post Liters of Oxygen 0 L             Oxygen Initial Assessment:  Oxygen Initial Assessment - 02/02/22 1721       Home Oxygen   Home Oxygen Device None    Sleep Oxygen Prescription None    Home Exercise Oxygen Prescription None    Home Resting  Oxygen Prescription None      Initial 6 min Walk   Oxygen Used None      Program Oxygen Prescription   Program Oxygen Prescription None      Intervention   Short Term Goals To learn and understand importance of maintaining oxygen saturations>88%;To learn and understand importance of monitoring SPO2 with pulse oximeter and demonstrate accurate use of the pulse oximeter.;To learn and demonstrate proper pursed lip breathing techniques or other breathing techniques. ;To learn and exhibit compliance with exercise, home and travel O2 prescription    Long  Term Goals Verbalizes importance of monitoring SPO2 with pulse oximeter and return demonstration;Exhibits proper breathing techniques, such as pursed lip breathing or other method taught during program session;Maintenance of O2 saturations>88%;Exhibits compliance with exercise, home  and travel O2 prescription             Oxygen Re-Evaluation:  Oxygen Re-Evaluation     Row Name 02/16/22 1023 02/27/22 0929           Program Oxygen Prescription   Program Oxygen Prescription -- None        Home Oxygen   Home Oxygen Device -- None      Sleep Oxygen Prescription -- None      Home Exercise Oxygen Prescription -- None      Home Resting Oxygen Prescription -- None        Goals/Expected Outcomes   Short Term Goals -- To learn and understand importance of monitoring SPO2 with pulse oximeter and demonstrate accurate use of the pulse oximeter.;To learn and understand importance of maintaining oxygen saturations>88%      Long  Term Goals -- Maintenance of O2 saturations>88%;Verbalizes importance of monitoring SPO2 with pulse oximeter and return demonstration      Comments Reviewed PLB technique with pt.  Talked about how it works and it's importance in maintaining their exercise saturations. He has a pulse oximeter to check his oxygen saturation at home. Informed and explained why it is important to have one. Reviewed that oxygen saturations  should be 88 percent and above. Patient verbalizes understanding.      Goals/Expected Outcomes Short: Become more profiecient at using PLB. Long: Become independent at using PLB. Short: monitor oxygen at home with exertion. Long: maintain oxygen saturations above 88 percent independently.               Oxygen Discharge (Final Oxygen Re-Evaluation):  Oxygen Re-Evaluation - 02/27/22 0929       Program Oxygen Prescription   Program Oxygen Prescription None      Home Oxygen   Home Oxygen  Device None    Sleep Oxygen Prescription None    Home Exercise Oxygen Prescription None    Home Resting Oxygen Prescription None      Goals/Expected Outcomes   Short Term Goals To learn and understand importance of monitoring SPO2 with pulse oximeter and demonstrate accurate use of the pulse oximeter.;To learn and understand importance of maintaining oxygen saturations>88%    Long  Term Goals Maintenance of O2 saturations>88%;Verbalizes importance of monitoring SPO2 with pulse oximeter and return demonstration    Comments He has a pulse oximeter to check his oxygen saturation at home. Informed and explained why it is important to have one. Reviewed that oxygen saturations should be 88 percent and above. Patient verbalizes understanding.    Goals/Expected Outcomes Short: monitor oxygen at home with exertion. Long: maintain oxygen saturations above 88 percent independently.             Initial Exercise Prescription:  Initial Exercise Prescription - 02/02/22 1700       Date of Initial Exercise RX and Referring Provider   Date 02/02/22    Referring Provider [name]      Oxygen   Maintain Oxygen Saturation 88% or higher      Treadmill   MPH 0.8    Grade 0    Minutes 15    METs 1.6      NuStep   Level 1    SPM 80    Minutes 15    METs 1      REL-XR   Level 1    Speed 50    Minutes 15    METs 1      T5 Nustep   Level 1    SPM 80    Minutes 15    METs 1      Biostep-RELP    Level 1    SPM 50    Minutes 15    METs 1      Prescription Details   Frequency (times per week) 3    Duration Progress to 30 minutes of continuous aerobic without signs/symptoms of physical distress      Intensity   THRR 40-80% of Max Heartrate 116-136    Ratings of Perceived Exertion 11-13    Perceived Dyspnea 0-4      Progression   Progression Continue to progress workloads to maintain intensity without signs/symptoms of physical distress.      Resistance Training   Training Prescription Yes    Weight 2    Reps 10-15             Perform Capillary Blood Glucose checks as needed.  Exercise Prescription Changes:   Exercise Prescription Changes     Row Name 02/02/22 1700 03/02/22 1400           Response to Exercise   Blood Pressure (Admit) 158/90 142/72      Blood Pressure (Exercise) 160/80 128/60      Blood Pressure (Exit) 140/70 128/70      Heart Rate (Admit) 96 bpm 95 bpm      Heart Rate (Exercise) 120 bpm 115 bpm      Heart Rate (Exit) 100 bpm 92 bpm      Oxygen Saturation (Admit) 94 % 98 %      Oxygen Saturation (Exercise) 90 % 90 %      Oxygen Saturation (Exit) 98 % 94 %      Rating of Perceived Exertion (Exercise) 15 13      Perceived  Dyspnea (Exercise) 3 3      Symptoms none SOB      Comments 6 MWT results 3rd full day of exercise      Duration -- Progress to 30 minutes of  aerobic without signs/symptoms of physical distress      Intensity -- THRR unchanged        Progression   Progression -- Continue to progress workloads to maintain intensity without signs/symptoms of physical distress.      Average METs -- 2.18        Resistance Training   Training Prescription -- Yes      Weight -- 2 lb      Reps -- 10-15        Interval Training   Interval Training -- No        Recumbant Bike   Level -- 1      Minutes -- 15      METs -- 1.9        NuStep   Level -- 3      Minutes -- 30      METs -- 2.3        T5 Nustep   Level -- 3       Minutes -- 30      METs -- 1.9        Oxygen   Maintain Oxygen Saturation -- 88% or higher               Exercise Comments:   Exercise Comments     Row Name 02/16/22 1022           Exercise Comments First full day of exercise!  Patient was oriented to gym and equipment including functions, settings, policies, and procedures.  Patient's individual exercise prescription and treatment plan were reviewed.  All starting workloads were established based on the results of the 6 minute walk test done at initial orientation visit.  The plan for exercise progression was also introduced and progression will be customized based on patient's performance and goals.                Exercise Goals and Review:   Exercise Goals     Row Name 02/02/22 1713             Exercise Goals   Increase Physical Activity Yes       Intervention Provide advice, education, support and counseling about physical activity/exercise needs.;Develop an individualized exercise prescription for aerobic and resistive training based on initial evaluation findings, risk stratification, comorbidities and participant's personal goals.       Expected Outcomes Short Term: Attend rehab on a regular basis to increase amount of physical activity.;Long Term: Add in home exercise to make exercise part of routine and to increase amount of physical activity.;Long Term: Exercising regularly at least 3-5 days a week.       Increase Strength and Stamina Yes       Intervention Provide advice, education, support and counseling about physical activity/exercise needs.;Develop an individualized exercise prescription for aerobic and resistive training based on initial evaluation findings, risk stratification, comorbidities and participant's personal goals.       Expected Outcomes Short Term: Increase workloads from initial exercise prescription for resistance, speed, and METs.;Short Term: Perform resistance training exercises routinely  during rehab and add in resistance training at home;Long Term: Improve cardiorespiratory fitness, muscular endurance and strength as measured by increased METs and functional capacity (6MWT)       Able to  understand and use rate of perceived exertion (RPE) scale Yes       Intervention Provide education and explanation on how to use RPE scale       Expected Outcomes Short Term: Able to use RPE daily in rehab to express subjective intensity level;Long Term:  Able to use RPE to guide intensity level when exercising independently       Able to understand and use Dyspnea scale Yes       Intervention Provide education and explanation on how to use Dyspnea scale       Expected Outcomes Short Term: Able to use Dyspnea scale daily in rehab to express subjective sense of shortness of breath during exertion;Long Term: Able to use Dyspnea scale to guide intensity level when exercising independently       Knowledge and understanding of Target Heart Rate Range (THRR) Yes       Intervention Provide education and explanation of THRR including how the numbers were predicted and where they are located for reference       Expected Outcomes Short Term: Able to state/look up THRR;Long Term: Able to use THRR to govern intensity when exercising independently;Short Term: Able to use daily as guideline for intensity in rehab       Able to check pulse independently Yes       Intervention Provide education and demonstration on how to check pulse in carotid and radial arteries.;Review the importance of being able to check your own pulse for safety during independent exercise       Expected Outcomes Short Term: Able to explain why pulse checking is important during independent exercise;Long Term: Able to check pulse independently and accurately       Understanding of Exercise Prescription Yes       Intervention Provide education, explanation, and written materials on patient's individual exercise prescription       Expected  Outcomes Short Term: Able to explain program exercise prescription;Long Term: Able to explain home exercise prescription to exercise independently                Exercise Goals Re-Evaluation :  Exercise Goals Re-Evaluation     Row Name 02/16/22 1022 03/02/22 1442 03/17/22 1522 03/30/22 1206 04/14/22 1416     Exercise Goal Re-Evaluation   Exercise Goals Review Able to understand and use rate of perceived exertion (RPE) scale;Able to understand and use Dyspnea scale;Knowledge and understanding of Target Heart Rate Range (THRR);Understanding of Exercise Prescription Increase Physical Activity;Increase Strength and Stamina;Understanding of Exercise Prescription Increase Physical Activity;Increase Strength and Stamina;Understanding of Exercise Prescription Increase Physical Activity;Increase Strength and Stamina;Understanding of Exercise Prescription --   Comments Reviewed RPE scale, THR and program prescription with pt today.  Pt voiced understanding and was given a copy of goals to take home. [name] is doing well for the first couple of sessions he has been here. He has not walked yet due to MSK limitations. He has been working at level 3 on both the T5 and T4 machines. Oxygen saturations are staying well above 88%. We will continue to monitor as he progresses in the program. We hope to see some walking into his regimen if he is able to at some point. [name] has not attended rehab since the last review. We will reach out to him and explain the importance of good attendance in the program. Once he returns we will continue to monitor his progress. [name] has not been to rehab since last review. He had a thoracentesis  completed on 1/9 and we are awaiting for clearance to return to rehab. Will continue to receive updates with patient. [name] has not been to rehab since last review. He had a thoracentesis completed on 1/9 and we are awaiting for clearance to return to rehab. He has been in the hospital with the  flu since he saw the doctor as well. Will continue to receive updates with patient.   Expected Outcomes Short: Use RPE daily to regulate intensity. Long: Follow program prescription in THR. Short: Continue exercise prescription, start with slow walking if able Long: Increase overall MET level Short: Return to regular attendance in rehab. Long: Continue to improve strength and stamina. Short: Get clearance to return to rehab Long: Graduate from [employer] Short: Get clearance to return to rehab Long: Graduate from [employer] Name 04/28/22 1443 04/28/22 1449           Exercise Goal Re-Evaluation   Exercise Goals Review Increase Physical Activity;Increase Strength and Stamina;Understanding of Exercise Prescription --      Comments [name] continues to be out of rehab. He has a procedure scheduled 3/1 and we will wait for patient to complete it and receive clearance before returning back. Will continue to receive updates from patient. [name] is completing home health PT and was advised he needs to finish that first before resuming back there at Pulmonary Rehab. Both him and son are aware.      Expected Outcomes Short: Return when cleared to do so Long: Graduate from [employer] --               Discharge Exercise Prescription (Final Exercise Prescription Changes):  Exercise Prescription Changes - 03/02/22 1400       Response to Exercise   Blood Pressure (Admit) 142/72    Blood Pressure (Exercise) 128/60    Blood Pressure (Exit) 128/70    Heart Rate (Admit) 95 bpm    Heart Rate (Exercise) 115 bpm    Heart Rate (Exit) 92 bpm    Oxygen Saturation (Admit) 98 %    Oxygen Saturation (Exercise) 90 %    Oxygen Saturation (Exit) 94 %    Rating of Perceived Exertion (Exercise) 13    Perceived Dyspnea (Exercise) 3    Symptoms SOB    Comments 3rd full day of exercise    Duration Progress to 30 minutes of  aerobic without signs/symptoms of physical distress    Intensity THRR  unchanged      Progression   Progression Continue to progress workloads to maintain intensity without signs/symptoms of physical distress.    Average METs 2.18      Resistance Training   Training Prescription Yes    Weight 2 lb    Reps 10-15      Interval Training   Interval Training No      Recumbant Bike   Level 1    Minutes 15    METs 1.9      NuStep   Level 3    Minutes 30    METs 2.3      T5 Nustep   Level 3    Minutes 30    METs 1.9      Oxygen   Maintain Oxygen Saturation 88% or higher             Nutrition:  Target Goals: Understanding of nutrition guidelines, daily intake of sodium <1520m, cholesterol <2017m calories 30% from fat and 7% or less from saturated fats, daily  to have 5 or more servings of fruits and vegetables.  Education: All About Nutrition: -Group instruction provided by verbal, written material, interactive activities, discussions, models, and posters to present general guidelines for heart healthy nutrition including fat, fiber, MyPlate, the role of sodium in heart healthy nutrition, utilization of the nutrition label, and utilization of this knowledge for meal planning. Follow up email sent as well. Written material given at graduation. Flowsheet Row Pulmonary Rehab from 02/25/2022 in [facility]  Education need identified 02/02/22       Biometrics:  Pre Biometrics - 02/02/22 1714       Pre Biometrics   Height 5' 10.75" (1.797 m)    Weight 307 lb 11.2 oz (139.6 kg)    Waist Circumference 56 inches    Hip Circumference 54 inches    Waist to Hip Ratio 1.04 %    BMI (Calculated) 43.22    Single Leg Stand 0 seconds              Nutrition Therapy Plan and Nutrition Goals:  Nutrition Therapy & Goals - 02/02/22 1607       Nutrition Therapy   Diet Heart healthy, low Na, T2DM    Drug/Food Interactions Statins/Certain Fruits    Protein (specify units) 80-85g    Fiber 30 grams    Whole Grain Foods 3  servings    Saturated Fats 16 max. grams    Fruits and Vegetables 8 servings/day    Sodium 2 grams      Personal Nutrition Goals   Nutrition Goal ST: practice building balanced meals with fiber, healthy fat, and protein. Continue to limit sodium. LT: limit Na <2g/day, follow MyPlate guidelines, manage A1C <7%    Comments 72 y.o. M admitted to pulmonary rehab for heart failure. PMHx includes T2DM, HTN, CAD, HLD, CKD stg 2. Relevant medications includes lipitor, ferrous sulfate, vitamin C, B-12, furosemide, novolog, lantus. He has a continuous meter and uses his short acting insulin using his current BG. [name] reports living with his son and his granddaughter. he is now drinking mostly water and limiting sodium. B: coffee with doughnut or muffin or poptart and some water L: leftovers, sandwich (pimento cheese or peanut butter - used to have deli meat) D: fruit - apples or sandwich (pimento cheese or peanut butter - used to have deli meat), non-starchy vegetables ("anything and everything" - he enjoys going to the farmers market), potatoes, steak, chicken, fish (cod). He cooks meat with olive oil on pan - hes getting used ot an air fryer. S: does not snack late at night anymore. Sometimes he rpeorts just hvaing a baked potato to eat. Discussed MyPlate and balancing meals as well as heart healthy eating and T2DM MNT.      Intervention Plan   Intervention Prescribe, educate and counsel regarding individualized specific dietary modifications aiming towards targeted core components such as weight, hypertension, lipid management, diabetes, heart failure and other comorbidities.;Nutrition handout(s) given to patient.    Expected Outcomes Short Term Goal: Understand basic principles of dietary content, such as calories, fat, sodium, cholesterol and nutrients.;Short Term Goal: A plan has been developed with personal nutrition goals set during dietitian appointment.;Long Term Goal: Adherence to prescribed nutrition  plan.             Nutrition Assessments:  MEDIFICTS Score Key: ?70 Need to make dietary changes  40-70 Heart Healthy Diet ? 40 Therapeutic Level Cholesterol Diet   Picture Your Plate Scores: [id number] Unhealthy dietary  pattern with much room for improvement. 41-50 Dietary pattern unlikely to meet recommendations for good health and room for improvement. 51-60 More healthful dietary pattern, with some room for improvement.  >60 Healthy dietary pattern, although there may be some specific behaviors that could be improved.   Nutrition Goals Re-Evaluation:  Nutrition Goals Re-Evaluation     [place] Name 02/27/22 0935             Goals   Current Weight 306 lb (138.8 kg)       Nutrition Goal Eat a diet that helps his sugar.       Comment [name] is taking his medication properly to help his diabetes. He is taking his shots three times a day before each meal. His A1c is around 6.7. He wants to continue to work on his blood sugar levels.       Expected Outcome Short: eat foods that help with his diabetes. Long: reduce A1c.                Nutrition Goals Discharge (Final Nutrition Goals Re-Evaluation):  Nutrition Goals Re-Evaluation - 02/27/22 0935       Goals   Current Weight 306 lb (138.8 kg)    Nutrition Goal Eat a diet that helps his sugar.    Comment [name] is taking his medication properly to help his diabetes. He is taking his shots three times a day before each meal. His A1c is around 6.7. He wants to continue to work on his blood sugar levels.    Expected Outcome Short: eat foods that help with his diabetes. Long: reduce A1c.             Psychosocial: Target Goals: Acknowledge presence or absence of significant depression and/or stress, maximize coping skills, provide positive support system. Participant is able to verbalize types and ability to use techniques and skills needed for reducing stress and depression.   Education: Stress, Anxiety, and Depression - Group  verbal and visual presentation to define topics covered.  Reviews how body is impacted by stress, anxiety, and depression.  Also discusses healthy ways to reduce stress and to treat/manage anxiety and depression.  Written material given at graduation. Flowsheet Row Pulmonary Rehab from 02/25/2022 in [facility] Cardiac and Pulmonary Rehab  Education need identified 02/02/22       Education: Sleep Hygiene -Provides group verbal and written instruction about how sleep can affect your health.  Define sleep hygiene, discuss sleep cycles and impact of sleep habits. Review good sleep hygiene tips.    Initial Review & Psychosocial Screening:  Initial Psych Review & Screening - 01/26/22 1113       Initial Review   Current issues with None Identified      Family Dynamics   Good Support System? Yes    Comments He can look to his son and grandaughter for support. He is concerned about his health due to his fluid build up and shortness of breath.      Barriers   Psychosocial barriers to participate in program The patient should benefit from training in stress management and relaxation.      Screening Interventions   Interventions Encouraged to exercise;To provide support and resources with identified psychosocial needs;Provide feedback about the scores to participant    Expected Outcomes Long Term Goal: Stressors or current issues are controlled or eliminated.;Short Term goal: Utilizing psychosocial counselor, staff and physician to assist with identification of specific Stressors or current issues interfering with healing process. Setting desired goal  for each stressor or current issue identified.;Short Term goal: Identification and review with participant of any Quality of Life or Depression concerns found by scoring the questionnaire.;Long Term goal: The participant improves quality of Life and PHQ9 Scores as seen by post scores and/or verbalization of changes             Quality of Life Scores:   Quality of Life - 02/02/22 1716       Quality of Life   Select Quality of Life      Quality of Life Scores   Health/Function Pre 13.38 %    Socioeconomic Pre 25.5 %    Psych/Spiritual Pre 29.14 %    Family Pre 30 %    GLOBAL Pre 21.43 %            Scores of 19 and below usually indicate a poorer quality of life in these areas.  A difference of  2-3 points is a clinically meaningful difference.  A difference of 2-3 points in the total score of the Quality of Life Index has been associated with significant improvement in overall quality of life, self-image, physical symptoms, and general health in studies assessing change in quality of life.  PHQ-9: Review Flowsheet       02/27/2022 02/02/2022 10/16/2021  Depression screen PHQ 2/9  Decreased Interest 0 0 0  Down, Depressed, Hopeless 0 0 0  PHQ - 2 Score 0 0 0  Altered sleeping 0 0 -  Tired, decreased energy 2 3 -  Change in appetite 2 2 -  Feeling bad or failure about yourself  0 0 -  Trouble concentrating 0 0 -  Moving slowly or fidgety/restless 0 1 -  Suicidal thoughts 0 0 -  PHQ-9 Score 4 6 -  Difficult doing work/chores Not difficult at all Somewhat difficult -   Interpretation of Total Score  Total Score Depression Severity:  1-4 = Minimal depression, 5-9 = Mild depression, 10-14 = Moderate depression, 15-19 = Moderately severe depression, 20-27 = Severe depression   Psychosocial Evaluation and Intervention:  Psychosocial Evaluation - 01/26/22 1115       Psychosocial Evaluation & Interventions   Interventions Stress management education;Relaxation education;Encouraged to exercise with the program and follow exercise prescription    Comments He can look to his son and grandaughter for support. He is concerned about his health due to his fluid build up and shortness of breath.    Expected Outcomes Short: Start [facility] to help with mood. Long: Maintain a healthy mental state.    Continue Psychosocial Services   Follow up required by staff             Psychosocial Re-Evaluation:  Psychosocial Re-Evaluation     [place] Name 02/27/22 [phone]             Psychosocial Re-Evaluation   Current issues with None Identified       Comments Reviewed patient health questionnaire (PHQ-9) with patient for follow up. Previously, patients score indicated signs/symptoms of depression.  Reviewed to see if patient is improving symptom wise while in program.  Score improved and patient states that it is because he is able to exercise more.       Expected Outcomes Short: Continue to attend [facility] regularly for regular exercise and social engagement. Long: Continue to improve symptoms and manage a positive mental state.       Interventions Encouraged to attend Pulmonary Rehabilitation for the exercise       Continue Psychosocial  Services  Follow up required by staff                Psychosocial Discharge (Final Psychosocial Re-Evaluation):  Psychosocial Re-Evaluation - 02/27/22 0933       Psychosocial Re-Evaluation   Current issues with None Identified    Comments Reviewed patient health questionnaire (PHQ-9) with patient for follow up. Previously, patients score indicated signs/symptoms of depression.  Reviewed to see if patient is improving symptom wise while in program.  Score improved and patient states that it is because he is able to exercise more.    Expected Outcomes Short: Continue to attend [facility] regularly for regular exercise and social engagement. Long: Continue to improve symptoms and manage a positive mental state.    Interventions Encouraged to attend Pulmonary Rehabilitation for the exercise    Continue Psychosocial Services  Follow up required by staff             Education: Education Goals: Education classes will be provided on a weekly basis, covering required topics. Participant will state understanding/return demonstration of topics presented.  Learning Barriers/Preferences:   Learning Barriers/Preferences - 01/26/22 1112       Learning Barriers/Preferences   Learning Barriers None    Learning Preferences None             General Pulmonary Education Topics:  Infection Prevention: - Provides verbal and written material to individual with discussion of infection control including proper hand washing and proper equipment cleaning during exercise session. Flowsheet Row Pulmonary Rehab from 02/25/2022 in [facility] Cardiac and Pulmonary Rehab  Date 02/02/22  Educator [facility]  Instruction Review Code 1- Verbalizes Understanding       Falls Prevention: - Provides verbal and written material to individual with discussion of falls prevention and safety. Flowsheet Row Pulmonary Rehab from 02/25/2022 in [facility] Cardiac and Pulmonary Rehab  Date 02/02/22  Educator [facility]  Instruction Review Code 1- Verbalizes Understanding       Chronic Lung Disease Review: - Group verbal instruction with posters, models, PowerPoint presentations and videos,  to review new updates, new respiratory medications, new advancements in procedures and treatments. Providing information on websites and "800" numbers for continued self-education. Includes information about supplement oxygen, available portable oxygen systems, continuous and intermittent flow rates, oxygen safety, concentrators, and Medicare reimbursement for oxygen. Explanation of Pulmonary Drugs, including class, frequency, complications, importance of spacers, rinsing mouth after steroid MDI's, and proper cleaning methods for nebulizers. Review of basic lung anatomy and physiology related to function, structure, and complications of lung disease. Review of risk factors. Discussion about methods for diagnosing sleep apnea and types of masks and machines for OSA. Includes a review of the use of types of environmental controls: home humidity, furnaces, filters, dust mite/pet prevention, HEPA vacuums. Discussion about weather changes, air  quality and the benefits of nasal washing. Instruction on Warning signs, infection symptoms, calling MD promptly, preventive modes, and value of vaccinations. Review of effective airway clearance, coughing and/or vibration techniques. Emphasizing that all should Create an Action Plan. Written material given at graduation.   AED/CPR: - Group verbal and written instruction with the use of models to demonstrate the basic use of the AED with the basic ABC's of resuscitation.    Anatomy and Cardiac Procedures: - Group verbal and visual presentation and models provide information about basic cardiac anatomy and function. Reviews the testing methods done to diagnose heart disease and the outcomes of the test results. Describes the treatment choices: Medical Management, Angioplasty, or Coronary  Bypass Surgery for treating various heart conditions including Myocardial Infarction, Angina, Valve Disease, and Cardiac Arrhythmias.  Written material given at graduation. Flowsheet Row Pulmonary Rehab from 02/25/2022 in [facility] Card[facility] and Pulmonary Rehab  Education need identified 02/02/22       Medication Safety: - Group verbal and visual instruction to review commonly prescribed medications for heart and lung disease. Reviews the medication, class of the drug, and side effects. Includes the steps to properly store meds and maintain the prescription regimen.  Written material given at graduation. Flowsheet Row Pulmonary Rehab from 02/25/2022 in [facility] Cardiac and Pulmonary Rehab  Date 02/25/22  Educator [facility]  Instruction Review Code 1- Verbalizes Understanding       Other: -Provides group and verbal instruction on various topics (see comments)   Knowledge Questionnaire Score:  Knowledge Questionnaire Score - 02/02/22 1720       Knowledge Questionnaire Score   Pre Score 21/26              Core Components/Risk Factors/Patient Goals at Admission:  Personal Goals and Risk Factors at Admission -  02/02/22 1720       Core Components/Risk Factors/Patient Goals on Admission    Weight Management Yes;Weight Loss;Obesity    Intervention Weight Management: Develop a combined nutrition and exercise program designed to reach desired caloric intake, while maintaining appropriate intake of nutrient and fiber, sodium and fats, and appropriate energy expenditure required for the weight goal.;Weight Management: Provide education and appropriate resources to help participant work on and attain dietary goals.;Weight Management/Obesity: Establish reasonable short term and long term weight goals.;Obesity: Provide education and appropriate resources to help participant work on and attain dietary goals.    Admit Weight 307 lb 11.2 oz (139.6 kg)    Goal Weight: Short Term 295 lb (133.8 kg)    Goal Weight: Long Term 250 lb (113.4 kg)    Expected Outcomes Short Term: Continue to assess and modify interventions until short term weight is achieved;Long Term: Adherence to nutrition and physical activity/exercise program aimed toward attainment of established weight goal;Weight Maintenance: Understanding of the daily nutrition guidelines, which includes 25-35% calories from fat, 7% or less cal from saturated fats, less than 278m cholesterol, less than 1.5gm of sodium, & 5 or more servings of fruits and vegetables daily;Weight Loss: Understanding of general recommendations for a balanced deficit meal plan, which promotes 1-2 lb weight loss per week and includes a negative energy balance of [phone] kcal/d;Understanding recommendations for meals to include 15-35% energy as protein, 25-35% energy from fat, 35-60% energy from carbohydrates, less than 2015mof dietary cholesterol, 20-35 gm of total fiber daily;Understanding of distribution of calorie intake throughout the day with the consumption of 4-5 meals/snacks    Improve shortness of breath with ADL's Yes    Intervention Provide education, individualized exercise plan and  daily activity instruction to help decrease symptoms of SOB with activities of daily living.    Expected Outcomes Short Term: Improve cardiorespiratory fitness to achieve a reduction of symptoms when performing ADLs;Long Term: Be able to perform more ADLs without symptoms or delay the onset of symptoms    Diabetes Yes    Intervention Provide education about signs/symptoms and action to take for hypo/hyperglycemia.;Provide education about proper nutrition, including hydration, and aerobic/resistive exercise prescription along with prescribed medications to achieve blood glucose in normal ranges: Fasting glucose 65-99 mg/dL    Expected Outcomes Short Term: Participant verbalizes understanding of the signs/symptoms and immediate care of hyper/hypoglycemia, proper foot care and importance  of medication, aerobic/resistive exercise and nutrition plan for blood glucose control.;Long Term: Attainment of HbA1C < 7%.    Heart Failure Yes    Intervention Provide a combined exercise and nutrition program that is supplemented with education, support and counseling about heart failure. Directed toward relieving symptoms such as shortness of breath, decreased exercise tolerance, and extremity edema.    Expected Outcomes Improve functional capacity of life;Short term: Attendance in program 2-3 days a week with increased exercise capacity. Reported lower sodium intake. Reported increased fruit and vegetable intake. Reports medication compliance.;Short term: Daily weights obtained and reported for increase. Utilizing diuretic protocols set by physician.;Long term: Adoption of self-care skills and reduction of barriers for early signs and symptoms recognition and intervention leading to self-care maintenance.    Hypertension Yes    Intervention Provide education on lifestyle modifcations including regular physical activity/exercise, weight management, moderate sodium restriction and increased consumption of fresh fruit,  vegetables, and low fat dairy, alcohol moderation, and smoking cessation.;Monitor prescription use compliance.    Expected Outcomes Short Term: Continued assessment and intervention until BP is < 140/68m HG in hypertensive participants. < 130/833mHG in hypertensive participants with diabetes, heart failure or chronic kidney disease.;Long Term: Maintenance of blood pressure at goal levels.    Lipids Yes    Intervention Provide education and support for participant on nutrition & aerobic/resistive exercise along with prescribed medications to achieve LDL <7038mHDL >14m63m  Expected Outcomes Short Term: Participant states understanding of desired cholesterol values and is compliant with medications prescribed. Participant is following exercise prescription and nutrition guidelines.;Long Term: Cholesterol controlled with medications as prescribed, with individualized exercise RX and with personalized nutrition plan. Value goals: LDL < 70mg20mL > 40 mg.             Education:Diabetes - Individual verbal and written instruction to review signs/symptoms of diabetes, desired ranges of glucose level fasting, after meals and with exercise. Acknowledge that pre and post exercise glucose checks will be done for 3 sessions at entry of program. [facility]  Date 02/02/22  Educator [facility] Review Code 1- Verbalizes Understanding       Know Your Numbers and Heart Failure: - Group verbal and visual instruction to discuss disease risk factors for cardiac and pulmonary disease and treatment options.  Reviews associated critical values for Overweight/Obesity, Hypertension, Cholesterol, and Diabetes.  Discusses basics of heart failure: signs/symptoms and treatments.  Introduces Heart Failure Zone chart for action plan for heart failure.  Written material given at graduation. Flow[facility]  Education need identified 02/02/22       Core Components/Risk Factors/Patient Goals Review:   Goals and Risk Factor Review     Row Name 02/27/22 0928             Core Components/Risk Factors/Patient Goals Review   Personal Goals Review Improve shortness of breath with ADL's       Review Spoke to patient about their shortness of breath and what they can do to improve. Patient has been informed of breathing techniques when starting the program. Patient is informed to tell staff if they have had any med changes and that certain meds they are taking or not taking can be causing shortness of breath.       Expected Outcomes Short: Attend [facility] regularly to improve shortness of breath with ADL's. Long: maintain  independence with ADL's                Core Components/Risk Factors/Patient Goals at Discharge (Final Review):   Goals and Risk Factor Review - 02/27/22 0928       Core Components/Risk Factors/Patient Goals Review   Personal Goals Review Improve shortness of breath with ADL's    Review Spoke to patient about their shortness of breath and what they can do to improve. Patient has been informed of breathing techniques when starting the program. Patient is informed to tell staff if they have had any med changes and that certain meds they are taking or not taking can be causing shortness of breath.    Expected Outcomes Short: Attend [facility] regularly to improve shortness of breath with ADL's. Long: maintain independence with ADL's             ITP Comments:  ITP Comments     Row Name 01/26/22 1110 02/02/22 1701 02/11/22 1051 02/16/22 1022 03/11/22 1119   ITP Comments Virtual Visit completed. Patient informed on EP and RD appointment and 6 Minute walk test. Patient also informed of patient health questionnaires on My Chart. Patient Verbalizes understanding. Visit diagnosis can be found in [facility] 12/23/2021. Completed 6MWT and gym orientation.  Initial ITP created and sent for review to Dr. Zetta Bills, Medical Director. 30 Day review completed. Medical Director ITP review done, changes made as directed, and signed approval by Medical Director.   new to program First full day of exercise!  Patient was oriented to gym and equipment including functions, settings, policies, and procedures.  Patient's individual exercise prescription and treatment plan were reviewed.  All starting workloads were established based on the results of the 6 minute walk test done at initial orientation visit.  The plan for exercise progression was also introduced and progression will be customized based on patient's performance and goals. 30 Day review completed. Medical Director ITP review done, changes made as directed, and signed approval by Medical Director.    new to program    Row Name 03/30/22 1208 04/06/22 1410 04/08/22 1052 04/28/22 1449 05/06/22 1508   ITP Comments Patient has been out. has not attended since 12/16. He had a thoracentesis completed on 03/24/22 and awaiting clearance to return. Spoke with patient who states he is feeling much better. Per Dr. Lanney Gins, would like to see patient in follow up first before returning back with rehab. Appt is on 1/26 and first appt back is 1/29 unless patient will call us if anything changes or if MD says otherwise. Patient in agreement. 30 Day review completed. Medical Director ITP review done, changes made as directed, and signed approval by Medical Director.   remains out for medical reason [name] was back in the hospital and discharged on 2/11. Spoke with patient who states he feels much better but is now enrolled in home health PT. Spoke with him and his son to advise that patient needs to finish home health PT first before resuming back with [place]. Receive clearance to return. Patient or son will call us to keep [place] updated on how long home health PT will be. 30 day review completed. ITP sent to Dr. Zetta Bills, Medical  Director of  Pulmonary Rehab. Continue with ITP unless changes are made by physician. Pt continues to be out on medical hold for HHPT.            Comments: 30 day review

## 2022-05-07 ENCOUNTER — Ambulatory Visit (INDEPENDENT_AMBULATORY_CARE_PROVIDER_SITE_OTHER): Payer: Medicare HMO | Admitting: Vascular Surgery

## 2022-05-07 ENCOUNTER — Ambulatory Visit (INDEPENDENT_AMBULATORY_CARE_PROVIDER_SITE_OTHER): Payer: Medicare HMO

## 2022-05-07 ENCOUNTER — Encounter (INDEPENDENT_AMBULATORY_CARE_PROVIDER_SITE_OTHER): Payer: Self-pay | Admitting: Vascular Surgery

## 2022-05-07 VITALS — BP 154/73 | HR 92 | Resp 16 | Wt 292.0 lb

## 2022-05-07 DIAGNOSIS — M79669 Pain in unspecified lower leg: Secondary | ICD-10-CM | POA: Diagnosis not present

## 2022-05-07 DIAGNOSIS — E1169 Type 2 diabetes mellitus with other specified complication: Secondary | ICD-10-CM

## 2022-05-07 DIAGNOSIS — I890 Lymphedema, not elsewhere classified: Secondary | ICD-10-CM

## 2022-05-07 DIAGNOSIS — J90 Pleural effusion, not elsewhere classified: Secondary | ICD-10-CM | POA: Diagnosis not present

## 2022-05-07 DIAGNOSIS — M7989 Other specified soft tissue disorders: Secondary | ICD-10-CM

## 2022-05-07 DIAGNOSIS — R0602 Shortness of breath: Secondary | ICD-10-CM | POA: Diagnosis not present

## 2022-05-07 DIAGNOSIS — I6523 Occlusion and stenosis of bilateral carotid arteries: Secondary | ICD-10-CM | POA: Diagnosis not present

## 2022-05-07 DIAGNOSIS — I25119 Atherosclerotic heart disease of native coronary artery with unspecified angina pectoris: Secondary | ICD-10-CM | POA: Diagnosis not present

## 2022-05-07 DIAGNOSIS — E669 Obesity, unspecified: Secondary | ICD-10-CM | POA: Diagnosis not present

## 2022-05-07 NOTE — Progress Notes (Signed)
MRN : [id number]  [name] is a 72 y.o. (September 16, 1950) male who presents with chief complaint of legs swell.  History of Present Illness:  The patient returns to the office for followup evaluation regarding leg swelling.  The swelling has persisted and the pain associated with swelling continues. There have not been any interval development of a ulcerations or wounds.  Since the previous visit the patient has been wearing graduated compression stockings as tolerated and has noted little if any improvement in the lymphedema. In fact the edema of the left leg is worse and he sustained a minor trauma to the mid shin a day or so ago and is now weeping clear fluid.  The patient has been trying to use compression routinely morning until night.  The patient also states elevation during the day and exercise is being done too.  Duplex ultrasound of the venous system bilateral lower extremity demonstrates normal deep venous system no evidence of chronic changes or obstruction.  Superficial reflux is not present bilaterally.  ABIs obtained today are normal on the right.  The left demonstrates a normal ABI with a TBI of 1.37.  The left anterior tibial appears to be biphasic to triphasic.  The left posterior tibial is monophasic.  Carotid artery duplex obtained today demonstrates 1 to 39% stenosis bilateral internal carotid arteries.  Bilateral vertebral arteries demonstrate antegrade flow.  Normal flow hemodynamics seen in bilateral subclavian arteries.  No outpatient medications have been marked as taking for the 05/07/22 encounter (Appointment) with Delana Meyer, Dolores Lory, MD.    Past Medical History:  Diagnosis Date   Arthritis    CHF (congestive heart failure), NYHA class II, acute on chronic, systolic (HCC)    EF [id number]   Coronary artery disease    [facility]   Diabetes mellitus without complication ([place])    Dysplastic nevus 08/26/2020   L flank, moderate atypia    Hyperlipidemia    Hypertension    Melanoma ([place]) 08/20/2015   Right neck. Superficial spreading, arising in nevus. Tumor thickness 1.77m, Anatomic level IV   Melanoma in situ ([facility] 05/22/2020   R lateral neck ant to scar, exc 05/22/20   Numbness in right leg    due to back   Skin cancer    removed l arm   Spinal stenosis    Stented coronary artery 2000   X 1 STENT   Swelling of both lower extremities     Past Surgical History:  Procedure Laterality Date   CATARACT EXTRACTION W/PHACO Right 05/03/2019   Procedure: CATARACT EXTRACTION PHACO AND INTRAOCULAR LENS PLACEMENT (IOC) RIGHT DIABETIC 6.44 00:58.4 11.0%;  Surgeon: BLeandrew Koyanagi MD;  [facility] [facility]  Service: Ophthalmology;  Laterality: Right;  DIABETIC   CATARACT EXTRACTION W/PHACO Left 06/28/2019   Procedure: CATARACT EXTRACTION PHACO AND INTRAOCULAR LENS PLACEMENT (IOC) LEFT DIABETIC 7.99  01:13.4  10.9% ;  Surgeon: BLeandrew Koyanagi MD;  [facility]  Service: Ophthalmology;  Laterality: Left;  Diabetic - insulin and oral meds   CHOLECYSTECTOMY     JOINT REPLACEMENT     RT KNEE   LUMBAR LAMINECTOMY/DECOMPRESSION MICRODISCECTOMY  04/13/2012   Procedure: LUMBAR LAMINECTOMY/DECOMPRESSION MICRODISCECTOMY 1 LEVEL;  Surgeon: JJohnn Hai MD;  Location: [facility];  Service: Orthopedics;  Laterality: Bilateral;  L4-L5   UVULECTOMY  1990's    Social History Social History   Tobacco Use   Smoking status: Never   Smokeless tobacco: Never  Vaping  Use   Vaping Use: Never used  Substance Use Topics   Alcohol use: Yes    Comment: rare   Drug use: No    Family History No family history on file.  Allergies  Allergen Reactions   Carvedilol Itching, Other (See Comments) and Swelling    Pain, itching and swelling beneath scrotum to anus.   Metoprolol Other (See Comments)    Cant remember  Cant remember  Cant remember  Cant remember   Toprol Xl [Metoprolol Tartrate] Other (See  Comments)    Cant remember   Buprenorphine Itching and Rash   Buprenorphine Hcl Itching and Rash   Morphine Itching and Other (See Comments)   Morphine And Related Itching, Rash and Other (See Comments)   Triamcinolone Rash   Triamcinolone Acetonide Rash     REVIEW OF SYSTEMS (Negative unless checked)  Constitutional: '[]'$ Weight loss  '[]'$ Fever  '[]'$ Chills Cardiac: '[]'$ Chest pain   '[]'$ Chest pressure   '[]'$ Palpitations   '[]'$ Shortness of breath when laying flat   '[]'$ Shortness of breath with exertion. Vascular:  '[]'$ Pain in legs with walking   '[x]'$ Pain in legs with standing  '[]'$ History of DVT   '[]'$ Phlebitis   '[x]'$ Swelling in legs   '[]'$ Varicose veins   '[]'$ Non-healing ulcers Pulmonary:   '[]'$ Uses home oxygen   '[]'$ Productive cough   '[]'$ Hemoptysis   '[]'$ Wheeze  '[]'$ COPD   '[]'$ Asthma Neurologic:  '[]'$ Dizziness   '[]'$ Seizures   '[]'$ History of stroke   '[]'$ History of TIA  '[]'$ Aphasia   '[]'$ Vissual changes   '[]'$ Weakness or numbness in arm   '[]'$ Weakness or numbness in leg Musculoskeletal:   '[]'$ Joint swelling   '[]'$ Joint pain   '[]'$ Low back pain Hematologic:  '[]'$ Easy bruising  '[]'$ Easy bleeding   '[]'$ Hypercoagulable state   '[]'$ Anemic Gastrointestinal:  '[]'$ Diarrhea   '[]'$ Vomiting  '[]'$ Gastroesophageal reflux/heartburn   '[]'$ Difficulty swallowing. Genitourinary:  '[]'$ Chronic kidney disease   '[]'$ Difficult urination  '[]'$ Frequent urination   '[]'$ Blood in urine Skin:  '[]'$ Rashes   '[]'$ Ulcers  Psychological:  '[]'$ History of anxiety   '[]'$  History of major depression.  Physical Examination  There were no vitals filed for this visit. There is no height or weight on file to calculate BMI. Gen: WD/WN, NAD Head: B[place]/AT, No temporalis wasting.  Ear/Nose/Throat: Hearing grossly intact, nares w/o erythema or drainage, pinna without lesions Eyes: PER, EOMI, sclera nonicteric.  Neck: Supple, no gross masses.  No JVD.  Pulmonary:  Good air movement, no audible wheezing, no use of accessory muscles.  Cardiac: RRR, precordium not hyperdynamic. Vascular:  scattered varicosities present  bilaterally.  Severe venous stasis changes to the legs bilaterally.  3-4+ soft pitting edema, CEAP C4sEpAsPr.  Superficial laceration mid left shin clean and uninfected Vessel Right Left  Radial Palpable Palpable  Gastrointestinal: soft, non-distended. No guarding/no peritoneal signs.  Musculoskeletal: M/S 5/5 throughout.  No deformity.  Neurologic: CN 2-12 intact. Pain and light touch intact in extremities.  Symmetrical.  Speech is fluent. Motor exam as listed above. Psychiatric: Judgment intact, Mood & affect appropriate for pt's clinical situation. Dermatologic: Severe venous rashes + ulcer noted left .  No changes consistent with cellulitis. Lymph : No lichenification or skin changes of chronic lymphedema.  CBC Lab Results  Component Value Date   WBC 7.2 04/26/2022   HGB 7.6 (L) 04/26/2022   HCT 24.3 (L) 04/26/2022   MCV 89.0 04/26/2022   PLT 266 04/26/2022    BMET    Component Value Date/Time   NA 136 04/26/2022 0559   NA 142 04/15/2011 0417   K  4.1 04/26/2022 0559   K 4.0 04/15/2011 0417   CL 99 04/26/2022 0559   CL 98 04/15/2011 0417   CO2 30 04/26/2022 0559   CO2 33 (H) 04/15/2011 0417   GLUCOSE 185 (H) 04/26/2022 0559   GLUCOSE 111 (H) 04/15/2011 0417   BUN 40 (H) 04/26/2022 0559   BUN 46 (H) 04/15/2011 0417   CREATININE 1.41 (H) 04/26/2022 0559   CREATININE 5.00 (H) 04/15/2011 0417   CALCIUM 8.0 (L) 04/26/2022 0559   CALCIUM 7.6 (L) 04/15/2011 0417   GFRNONAA 53 (L) 04/26/2022 0559   GFRNONAA 13 (L) 04/15/2011 0417   GFRAA >60 10/15/2019 1035   GFRAA 15 (L) 04/15/2011 0417   Estimated Creatinine Clearance: 63 mL/min (A) (by C-G formula based on SCr of 1.41 mg/dL (H)).  COAG Lab Results  Component Value Date   INR 1.1 02/19/2022   INR 1.0 10/15/2019   INR 1.0 05/16/2019    Radiology DG Chest Port 1 View  Result Date: 04/26/2022 CLINICAL DATA:  Atelectasis, left pleural effusion EXAM: PORTABLE CHEST 1 VIEW COMPARISON:  Prior chest x-ray yesterday; CT  scan of the chest 04/23/2022 FINDINGS: Persistent left basilar opacity. Stable cardiomegaly. Atherosclerotic calcifications present in the transverse aorta. Lung volumes remain low. No significant interval change in the appearance of the chest. IMPRESSION: No interval change in the appearance of the chest with a persistent left basilar opacity in patient with known left lower lobe mass. Electronically Signed   By: Jacqulynn Cadet M.D.   On: 04/26/2022 09:39   DG Chest Port 1 View  Result Date: 04/25/2022 CLINICAL DATA:  72 year old male with history of left-sided chest tube status post removal. EXAM: PORTABLE CHEST 1 VIEW COMPARISON:  Chest x-ray 04/22/2022. FINDINGS: Previously noted left-sided chest tube has been removed. There is gas in the overlying soft tissues of the lower left chest wall. No appreciable pneumothorax identified. Trace volume of left pleural fluid. Opacity at the left base which may reflect atelectasis and/or consolidation. Right lung is clear. No right pleural fluid. No evidence of pulmonary edema. Heart size is normal. Upper mediastinal contours are within normal limits allowing for patient rotation to the left. Atherosclerotic calcifications in the thoracic aorta. IMPRESSION: 1. Status post removal of left-sided chest tube. No pneumothorax. Trace left pleural effusion. 2. Atelectasis and/or consolidation in the left lower lobe. 3. Aortic atherosclerosis. Electronically Signed   By: Vinnie Langton M.D.   On: 04/25/2022 08:41   CT CHEST WO CONTRAST  Result Date: 04/23/2022 CLINICAL DATA:  April 11, 2022 CT of the chest. * Tracking Code: BO * EXAM: CT CHEST WITHOUT CONTRAST TECHNIQUE: Multidetector CT imaging of the chest was performed following the standard protocol without IV contrast. RADIATION DOSE REDUCTION: This exam was performed according to the departmental dose-optimization program which includes automated exposure control, adjustment of the mA and/or kV according to  patient size and/or use of iterative reconstruction technique. COMPARISON:  April 11, 2018 for FINDINGS: Cardiovascular: Three-vessel coronary artery disease with extensive coronary calcification. Heart size is enlarged with low-attenuation in cardiac blood pool compatible with anemia. Central pulmonary vasculature is normal caliber. Aorta with scattered atherosclerosis. Limited assessment of cardiovascular structures given lack of intravenous contrast. Mediastinum/Nodes: No signs of adenopathy in the chest with scattered small lymph nodes throughout the chest. No retrocrural adenopathy. Lungs/Pleura: LEFT-sided chest tube in place. Small anterior LEFT pneumothorax. After marked reduction in pleural fluid there is a LEFT lower lobe mass present measuring 6.5 x 4.8 cm (image 107/3) peripheral  consolidative changes are noted in the dependent LEFT chest though there is improved aeration throughout the LEFT lower lobe. Chest tube terminates along the posterior margin of the LEFT heart in the medial LEFT chest just anterior to the LEFT infrahilar region tracking between the lingula and the heart border. Soft tissue nodularity along the pleural surface is evident following reduction in pleural fluid as well is unclear whether this is complex fluid or true soft tissue (image 112/2 area measuring 4.6 x 2.0 cm is contiguous with pleural fluid elsewhere in the chest which is now limited mainly to the dependent posterior LEFT chest. Increased density persists along the margin of collapsed lung dependent LEFT pleural space. Tiny juxta fissural nodule in the RIGHT chest (image 81/2) 5 mm along the minor fissure is nonspecific. Large airways are patent. Upper Abdomen: Incidental imaging of upper abdominal contents show no acute process select showed no acute process shows no acute process. Post cholecystectomy. Musculoskeletal: Subcutaneous emphysema along the LEFT chest wall. No acute bone finding. No destructive bone process.  Spinal degenerative changes. IMPRESSION: 1. LEFT-sided chest tube in place. Small anterior LEFT pneumothorax. Chest tube terminates between the lingula and LEFT heart border, tip just anterior to infrahilar region on the LEFT. Correlate with tube function and with desired positioning with repositioning as needed. 2. LEFT lower lobe mass suspicious for pulmonary neoplasm with peripheral postobstructive changes or ill-defined tumor tracking peripheral to the mass. Based on large size PET and/or biopsy is suggested. 3. Increased density in the inferior and medial LEFT pleural space is unchanged and may reflect sequela of prior aspiration of barium are similar process, calcification in this area is also considered. There is slightly more dense nodular appearing pleural surface along the lateral LEFT chest that could be indicative of malignant effusion. 4. Improved aeration throughout the LEFT lower lobe. 5. Tiny juxta fissural nodule in the RIGHT chest is nonspecific. Attention on follow-up. 6. Three-vessel coronary artery disease with extensive coronary calcification. 7. Aortic atherosclerosis. Aortic Atherosclerosis (ICD10-I70.0). Electronically Signed   By: Zetta Bills M.D.   On: 04/23/2022 09:54   DG Chest Port 1 View  Result Date: 04/22/2022 CLINICAL DATA:  Pleural effusion EXAM: PORTABLE CHEST 1 VIEW COMPARISON:  04/18/2022, CT 04/11/2022, 04/14/2022 FINDINGS: Left chest tube minimally retracted compared to prior. Slightly improved aeration of left lung base with probable decreased left effusion. Enlarged cardiomediastinal silhouette with aortic atherosclerosis. No convincing pneumothorax IMPRESSION: Left chest tube minimally retracted compared to prior. Slightly improved aeration of left lung base with probable decreased left effusion. Electronically Signed   By: Donavan Foil M.D.   On: 04/22/2022 15:28   DG Chest Port 1 View  Result Date: 04/18/2022 CLINICAL DATA:  Follow-up left pleural effusion.  EXAM: PORTABLE CHEST 1 VIEW COMPARISON:  04/15/2022 FINDINGS: Left-sided chest tube is in place and appears unchanged in position from previous exam. No pneumothorax identified. Unchanged opacification of the left lower lung which likely represents a combination of pleural effusion, airspace disease and atelectasis. Right lung appears clear IMPRESSION: 1. Stable position of left chest tube. No pneumothorax. 2. No change in aeration to the left lower lung. Electronically Signed   By: Kerby Moors M.D.   On: 04/18/2022 13:28   DG Chest Port 1 View  Result Date: 04/15/2022 CLINICAL DATA:  Left pleural effusion. EXAM: PORTABLE CHEST 1 VIEW COMPARISON:  April 14, 2022. FINDINGS: Stable cardiomegaly. Stable left basilar atelectasis or infiltrate is noted with associated pleural effusion. Minimal right basilar subsegmental atelectasis  is noted. Bony thorax is unremarkable. IMPRESSION: Stable left basilar atelectasis or infiltrate is noted with associated pleural effusion. Minimal right basilar subsegmental atelectasis. Electronically Signed   By: Marijo Conception M.D.   On: 04/15/2022 14:49   DG Chest Port 1 View  Result Date: 04/14/2022 CLINICAL DATA:  Left-sided pleural effusion EXAM: PORTABLE CHEST 1 VIEW COMPARISON:  04/12/2022 FINDINGS: Cardiomegaly. Similar appearance of layering left pleural effusion and associated atelectasis or consolidation. Left-sided chest tube remains in position about the left lung base. No pneumothorax. No new airspace opacity. Osseous structures unremarkable. The visualized skeletal structures are unremarkable. IMPRESSION: 1. Similar appearance of layering left pleural effusion and associated atelectasis or consolidation. Left-sided chest tube remains in position about the left lung base. No pneumothorax. No new airspace opacity. 2. Cardiomegaly. Electronically Signed   By: Delanna Ahmadi M.D.   On: 04/14/2022 12:58   ECHOCARDIOGRAM COMPLETE  Result Date: 04/12/2022     ECHOCARDIOGRAM REPORT   Patient Name:   MAXYMILIAN SHERRATT Date of Exam: 04/12/2022 Medical Rec #:  [id number]      Height:       72.0 in Accession #:    [id number]     Weight:       305.8 lb Date of Birth:  [birth year]      BSA:          2.552 m Patient Age:    15 years       BP:           136/99 mmHg Patient Gender: M              HR:           101 bpm. Exam Location:  [facility] Procedure: 2D Echo Indications:     CHF I50.21  History:         Patient has no prior history of Echocardiogram examinations.  Sonographer:     Kathlen Brunswick RDCS Referring Phys:  [id number] Sharen Hones Diagnosing Phys: Yolonda Kida MD  Sonographer Comments: Technically challenging study due to limited acoustic windows, patient is obese and suboptimal apical window. Image acquisition challenging due to patient body habitus. Difficult and challenging study due to left side chest tube and  drain at the time of this study. IMPRESSIONS  1. Left ventricular ejection fraction, by estimation, is 45 to 50%. The left ventricle has mildly decreased function. The left ventricle demonstrates global hypokinesis. The left ventricular internal cavity size was moderately to severely dilated. There  is moderate concentric left ventricular hypertrophy. Left ventricular diastolic parameters are consistent with Grade II diastolic dysfunction (pseudonormalization).  2. Right ventricular systolic function is mildly reduced. The right ventricular size is moderately enlarged. Mildly increased right ventricular wall thickness.  3. Left atrial size was mildly dilated.  4. Right atrial size was mildly dilated.  5. Moderate pleural effusion in the left lateral region.  6. The mitral valve was not well visualized. Trivial mitral valve regurgitation.  7. The aortic valve is grossly normal. Aortic valve regurgitation is not visualized. Aortic valve sclerosis is present, with no evidence of aortic valve stenosis. FINDINGS  Left Ventricle: Left ventricular ejection fraction, by  estimation, is 45 to 50%. The left ventricle has mildly decreased function. The left ventricle demonstrates global hypokinesis. The left ventricular internal cavity size was moderately to severely dilated. There is moderate concentric left ventricular hypertrophy. Left ventricular diastolic parameters are consistent with Grade II diastolic dysfunction (pseudonormalization). Right Ventricle: The right ventricular size is  moderately enlarged. Mildly increased right ventricular wall thickness. Right ventricular systolic function is mildly reduced. Left Atrium: Left atrial size was mildly dilated. Right Atrium: Right atrial size was mildly dilated. Pericardium: There is no evidence of pericardial effusion. Mitral Valve: The mitral valve was not well visualized. Trivial mitral valve regurgitation. Tricuspid Valve: The tricuspid valve is not well visualized. Tricuspid valve regurgitation is mild. Aortic Valve: The aortic valve is grossly normal. Aortic valve regurgitation is not visualized. Aortic valve sclerosis is present, with no evidence of aortic valve stenosis. Aortic valve peak gradient measures 3.0 mmHg. Pulmonic Valve: The pulmonic valve was normal in structure. Pulmonic valve regurgitation is not visualized. Aorta: The aortic arch was not well visualized. IAS/Shunts: No atrial level shunt detected by color flow Doppler. Additional Comments: There is a moderate pleural effusion in the left lateral region.  LEFT VENTRICLE PLAX 2D LVIDd:         5.90 cm      Diastology LVIDs:         4.60 cm      LV e' medial:    8.49 cm/s LV PW:         1.50 cm      LV E/e' medial:  13.5 LV IVS:        1.60 cm      LV e' lateral:   8.49 cm/s LVOT diam:     2.30 cm      LV E/e' lateral: 13.5 LV SV:         43 LV SV Index:   17 LVOT Area:     4.15 cm  LV Volumes (MOD) LV vol d, MOD A4C: 254.0 ml LV vol s, MOD A4C: 175.0 ml LV SV MOD A4C:     254.0 ml LEFT ATRIUM           Index LA diam:      4.20 cm 1.65 cm/m LA Vol (A4C): 43.0 ml  16.85 ml/m  AORTIC VALVE                 PULMONIC VALVE AV Area (Vmax): 2.99 cm     PV Vmax:       0.67 m/s AV Vmax:        86.60 cm/s   PV Peak grad:  1.8 mmHg AV Peak Grad:   3.0 mmHg LVOT Vmax:      62.30 cm/s LVOT Vmean:     41.800 cm/s LVOT VTI:       0.103 m  AORTA Ao Root diam: 3.70 cm Ao Asc diam:  2.90 cm MITRAL VALVE MV Area (PHT): 6.90 cm     SHUNTS MV Decel Time: 110 msec     Systemic VTI:  0.10 m MV E velocity: 115.00 cm/s  Systemic Diam: 2.30 cm MV A velocity: 82.30 cm/s MV E/A ratio:  1.40 Yolonda Kida MD Electronically signed by Yolonda Kida MD Signature Date/Time: 04/12/2022/4:14:41 PM    Final    US Abdomen Complete  Result Date: 04/12/2022 CLINICAL DATA:  Cirrhosis with ascites. EXAM: ABDOMEN ULTRASOUND COMPLETE COMPARISON:  Right upper quadrant ultrasound on 04/08/2022 FINDINGS: Gallbladder: Surgically absent. Common bile duct: Diameter: 7 mm, within normal limits post cholecystectomy. Liver: Suboptimal visualization due to patient habitus and limited acoustic windows. No focal liver lesion identified. Within normal limits in parenchymal echogenicity. Portal vein is patent on color Doppler imaging with normal direction of blood flow towards the liver. IVC: No abnormality visualized. Pancreas: Not visualized due to  patient habitus and bowel gas. Spleen: Could not be visualized due to bandages from left chest tube. Right Kidney: Length: 11.9 cm. Echogenicity within normal limits. No mass or hydronephrosis visualized. Left Kidney: Length: 11.8 cm. Echogenicity within normal limits. No mass or hydronephrosis visualized. Abdominal aorta: Not visualized due to overlying bowel gas. Other findings: No evidence of ascites. IMPRESSION: Technically limited exam due to patient habitus and bandages from chest tube. Prior cholecystectomy.  No evidence of biliary ductal dilatation. No definite hepatic abnormality identified.  No evidence of ascites. Electronically Signed   By: Marlaine Hind M.D.    On: 04/12/2022 12:44   DG Chest Port 1 View  Result Date: 04/12/2022 CLINICAL DATA:  Chest tube in place EXAM: PORTABLE CHEST 1 VIEW COMPARISON:  Chest x-ray April 12, 2022 FINDINGS: A left chest tube is in stable position. Persistent effusion with underlying opacity, similar to mildly worsened in the interval. No pneumothorax. No other changes. IMPRESSION: A left chest tube remains in place. Persistent effusion with underlying opacity, similar to mildly worsened in the interval. No pneumothorax. Electronically Signed   By: Dorise Bullion III M.D.   On: 04/12/2022 08:19   DG Chest Portable 1 View  Result Date: 04/12/2022 CLINICAL DATA:  Post chest tube placement EXAM: PORTABLE CHEST 1 VIEW COMPARISON:  Radiograph and CT 04/11/2022 FINDINGS: Decreased now small left pleural effusion after left basilar chest tube placement. Decreased left mid and lower lung atelectasis/consolidation. The right lung is clear. No pneumothorax. Stable cardiomediastinal silhouette. Aortic atherosclerotic calcification. IMPRESSION: Decreased now small left pleural effusion after left basilar chest tube placement. Electronically Signed   By: Placido Sou M.D.   On: 04/12/2022 00:31   CT Angio Chest PE W and/or Wo Contrast  Result Date: 04/11/2022 CLINICAL DATA:  Confusion and coughing up blood EXAM: CT ANGIOGRAPHY CHEST WITH CONTRAST TECHNIQUE: Multidetector CT imaging of the chest was performed using the standard protocol during bolus administration of intravenous contrast. Multiplanar CT image reconstructions and MIPs were obtained to evaluate the vascular anatomy. RADIATION DOSE REDUCTION: This exam was performed according to the departmental dose-optimization program which includes automated exposure control, adjustment of the mA and/or kV according to patient size and/or use of iterative reconstruction technique. CONTRAST:  29m OMNIPAQUE IOHEXOL 350 MG/ML SOLN COMPARISON:  CTA chest 10/15/2019 and radiographs  earlier today FINDINGS: Cardiovascular: Satisfactory opacification of the pulmonary arteries to the segmental level. No evidence of pulmonary embolism. Hazy hyperdensity layering along the periphery of the left lower lobe. This is indeterminate on single phase exam and could be due to heterogenous perfusion in the setting of pneumonia however is suspicious for active contrast extravasation possibly related to recent thoracentesis and/or AVM as this appears rounded peripherally (circa series 6/image 255-268). No definite feeding vessel is seen. Cardiomegaly. No pericardial effusion. Coronary artery and aortic atherosclerotic calcification. Mediastinum/Nodes: Shotty mediastinal and hilar lymph nodes have increased since 10/15/2019. The largest is a precarinal node measuring 1.3 cm (4/47). These are favored reactive. Unremarkable esophagus. Lungs/Pleura: Large left pleural effusion some of which is hyperdense posteriorly suspicious for hemothorax. Hazy ground-glass opacities in the superior segment left lower lobe and low-attenuation consolidation in the inferior left lower lobe. These findings may be due to hemorrhage and/or pneumonia. Secretions/fluid within the left mainstem bronchus. The left lower lobe bronchi are not visualized. Secretions/fluid within the lingular bronchi. Atelectasis/consolidation in the lingula. Mild bronchial wall thickening and clustered centrilobular micro nodularity in the right lung. Part solid and subsolid nodule in the right  upper lobe measuring 1.0 cm with the solid component measuring 3 mm (6/66). Upper Abdomen: Cholecystectomy.  No acute abnormality. Musculoskeletal: No chest wall abnormality. No acute osseous findings. Review of the MIP images confirms the above findings. IMPRESSION: 1. Findings concerning for active hemorrhage in the left lower lobe possibly related to recent thoracentesis and/or AVM versus heterogenous perfusion in the setting of pneumonia. 2. Large left  hemothorax. 3. Ground-glass and low-attenuation consolidation in the left lower lobe suspicious for pneumonia and/or alveolar hemorrhage. 4. Nonspecific secretions or blood within the left mainstem bronchus left lower lobe bronchi and lingula. 5. Part solid and subsolid nodule in the right upper lobe measuring 1.0 cm. Follow-up non-contrast CT recommended at 3-6 months to confirm persistence. If unchanged, and solid component remains <6 mm, annual CT is recommended until 5 years of stability has been established. If persistent these nodules should be considered highly suspicious if the solid component of the nodule is 6 mm or greater in size and enlarging. This recommendation follows the consensus statement: Guidelines for Management of Incidental Pulmonary Nodules Detected on CT Images: From the Fleischner Society 2017; Radiology 2017; 284:228-243. Critical Value/emergent results were called by telephone at the time of interpretation on 04/11/2022 at 10:34 pm to provider PHILLIP STAFFORD , who verbally acknowledged these results. Electronically Signed   By: Placido Sou M.D.   On: 04/11/2022 22:50   DG Chest Portable 1 View  Result Date: 04/11/2022 CLINICAL DATA:  Shortness of breath and decreased left breath sounds EXAM: PORTABLE CHEST 1 VIEW COMPARISON:  Radiograph 04/02/2022 FINDINGS: Similar to increased large left pleural effusion and associated atelectasis/consolidation. The right lung is clear. No pneumothorax. Stable partially obscured cardiomediastinal silhouette. Aortic atherosclerotic calcification. IMPRESSION: Slightly increased large left pleural effusion and associated atelectasis/consolidation. Electronically Signed   By: Placido Sou M.D.   On: 04/11/2022 20:51   US Abdomen Limited RUQ (LIVER/GB)  Result Date: 04/08/2022 CLINICAL DATA:  Cirrhosis EXAM: ULTRASOUND ABDOMEN LIMITED RIGHT UPPER QUADRANT COMPARISON:  CT abdomen and pelvis April 07, 2011, renal ultrasound January 30, 2022  FINDINGS: Gallbladder: Surgically absent. Common bile duct: Diameter: 5.5 mm Liver: No focal lesion identified. Within normal limits in parenchymal echogenicity. Portal vein is patent on color Doppler imaging with normal direction of blood flow towards the liver. Other: None. IMPRESSION: No acute abnormality. Electronically Signed   By: Abelardo Diesel M.D.   On: 04/08/2022 09:12     Assessment/Plan 1. Pain and swelling of lower leg, unspecified laterality No surgery or intervention at this point in time.   The Patient is CEAP C4sEpAsPr.    I have had a long discussion with the patient regarding venous insufficiency and why it  causes symptoms, specifically venous ulceration. I have discussed with the patient the chronic skin changes that accompany venous insufficiency and the long term sequela such as infection and recurring  ulceration.  Patient will begin wrapping his leg daily.  In addition, behavioral modification including several periods of elevation of the lower extremities during the day will be continued. Achieving a position with the ankles at heart level was stressed to the patient  The patient is instructed to begin routine exercise, especially walking on a daily basis  The patient clearly needs compression wraps as well as a Lymph Pump.  - VAS [place] ABI WITH/WO TBI; Future  2. Lymphedema Recommend:  No surgery or intervention at this point in time.   The Patient is CEAP C4sEpAsPr.  The patient has been wearing compression for  more than 12 weeks with no or little benefit.  The patient has been exercising daily for more than 12 weeks. The patient has been elevating and taking OTC pain medications for more than 12 weeks.  None of these have have eliminated the pain related to the lymphedema or the discomfort regarding excessive swelling and venous congestion.    I have reviewed my discussion with the patient regarding lymphedema and why it  causes symptoms.  Patient will continue  wearing graduated compression on a daily basis. The patient should put the compression on first thing in the morning and removing them in the evening. The patient should not sleep in the compression.   In addition, behavioral modification throughout the day will be continued.  This will include frequent elevation (such as in a recliner), use of over the counter pain medications as needed and exercise such as walking.  The systemic causes for chronic edema such as liver, kidney and cardiac etiologies do not appear to have significant changed over the past year.    The patient has chronic , severe lymphedema with hyperpigmentation of the skin and has done MLD, skin care, medication, diet, exercise, elevation and compression for 4 weeks with no improvement,  I am recommending a lymphedema pump.  The patient still has stage 3 lymphedema and therefore, I believe that a lymph pump is needed to improve the control of the patient's lymphedema and improve the quality of life.  Additionally, a lymph pump is warranted because it will reduce the risk of cellulitis and ulceration in the future.  Patient should follow-up in six months   3. Bilateral carotid artery stenosis Recommend:  Given the patient's asymptomatic subcritical stenosis no further invasive testing or surgery at this time.  Duplex ultrasound shows 1-39% stenosis bilaterally.  Continue antiplatelet therapy as prescribed Continue management of CAD, HTN and Hyperlipidemia Healthy heart diet,  encouraged exercise at least 4 times per week Follow up in 24 months with duplex ultrasound and physical exam   4. Coronary artery disease involving native coronary artery of native heart with angina pectoris ([place]) Continue cardiac and antihypertensive medications as already ordered and reviewed, no changes at this time.  Continue statin as ordered and reviewed, no changes at this time  Nitrates PRN for chest pain  5. Diabetes mellitus type 2 in  obese [facility]) Continue hypoglycemic medications as already ordered, these medications have been reviewed and there are no changes at this time.  Hgb A1C to be monitored as already arranged by primary service   Hortencia Pilar, MD  05/07/2022 9:31 AM

## 2022-05-08 ENCOUNTER — Encounter
Admission: RE | Admit: 2022-05-08 | Discharge: 2022-05-08 | Disposition: A | Discharge status: Home / Self Care | Payer: Medicare HMO | Source: Ambulatory Visit | Attending: Pulmonary Disease | Admitting: Pulmonary Disease

## 2022-05-08 DIAGNOSIS — I25119 Atherosclerotic heart disease of native coronary artery with unspecified angina pectoris: Secondary | ICD-10-CM

## 2022-05-08 DIAGNOSIS — D649 Anemia, unspecified: Secondary | ICD-10-CM

## 2022-05-08 DIAGNOSIS — I700 Atherosclerosis of aorta: Secondary | ICD-10-CM

## 2022-05-08 DIAGNOSIS — Z01812 Encounter for preprocedural laboratory examination: Secondary | ICD-10-CM

## 2022-05-08 DIAGNOSIS — I10 Essential (primary) hypertension: Secondary | ICD-10-CM

## 2022-05-08 DIAGNOSIS — E1165 Type 2 diabetes mellitus with hyperglycemia: Secondary | ICD-10-CM

## 2022-05-08 DIAGNOSIS — Z01818 Encounter for other preprocedural examination: Secondary | ICD-10-CM

## 2022-05-08 DIAGNOSIS — Z01810 Encounter for preprocedural cardiovascular examination: Secondary | ICD-10-CM

## 2022-05-08 DIAGNOSIS — R0602 Shortness of breath: Secondary | ICD-10-CM

## 2022-05-08 HISTORY — DX: Iron deficiency anemia, unspecified: D50.9

## 2022-05-08 HISTORY — DX: Abnormal findings on diagnostic imaging of heart and coronary circulation: R93.1

## 2022-05-08 HISTORY — DX: Dyspnea, unspecified: R06.00

## 2022-05-08 HISTORY — DX: Pleural effusion, not elsewhere classified: J90

## 2022-05-08 HISTORY — DX: Chronic nephritic syndrome with diffuse membranous glomerulonephritis: N03.2

## 2022-05-08 HISTORY — DX: Sleep apnea, unspecified: G47.30

## 2022-05-08 HISTORY — DX: Atherosclerosis of aorta: I70.0

## 2022-05-08 HISTORY — DX: Polyneuropathy, unspecified: G62.9

## 2022-05-08 HISTORY — DX: Other specified abnormal findings of blood chemistry: R79.89

## 2022-05-08 HISTORY — DX: Varicose veins of unspecified lower extremity with ulcer of ankle: I83.003

## 2022-05-08 HISTORY — DX: Chronic kidney disease, stage 2 (mild): N18.2

## 2022-05-08 HISTORY — DX: Lymphedema, not elsewhere classified: I89.0

## 2022-05-08 HISTORY — DX: Occlusion and stenosis of bilateral carotid arteries: I65.23

## 2022-05-08 LAB — VAS US ABI WITH/WO TBI
Left ABI: >1
Right ABI: >1

## 2022-05-08 NOTE — Pre-Procedure Instructions (Signed)
When doing anesthesia interview pt states that he and his son were told when to stop his 162 mg asa but his son has all the info and his son is at work currently. Pt instructed to follow what was told by his surgeons/cardiology office

## 2022-05-08 NOTE — Patient Instructions (Addendum)
Your procedure is scheduled on:05-15-22 Friday Report to the Registration Desk on the 1st floor of the [facility].Then proceed to the 2nd floor Surgery Desk To find out your arrival time, please call [phone] between 1PM - 3PM on:05-14-22 Thursday If your arrival time is 6:00 am, do not arrive before that time as the [facility] entrance doors do not open until 6:00 am.  REMEMBER: Instructions that are not followed completely may result in serious medical risk, up to and including death; or upon the discretion of your surgeon and anesthesiologist your surgery may need to be rescheduled.  Do not eat food after midnight the night before surgery.  No gum chewing or hard candies.  You may however, drink Water up to 2 hours before you are scheduled to arrive for your surgery. Do not drink anything within 2 hours of your scheduled arrival time  One week prior to surgery: Stop Anti-inflammatories (NSAIDS) such as Advil, Aleve, Ibuprofen, Motrin, Naproxen, Naprosyn and Aspirin based products such as Excedrin, Goody's Powder, BC Powder.You may however, continue to take Tylenol if needed for pain up until the day of surgery.  Stop ANY OVER THE COUNTER supplements/vitamins NOW (05-08-22) until after surgery (Continue your Iron pill)  Follow instructions given by Dr Aleskerov/cardiology regarding your 162 mg Aspirin  TAKE ONLY THESE MEDICATIONS THE MORNING OF SURGERY WITH A SIP OF WATER: -tamsulosin (FLOMAX)   Take half of your Lantus Insulin (17 units) the night prior to surgery and NO Insulin the morning of surgery  No Alcohol for 24 hours before or after surgery.  No Smoking including e-cigarettes for 24 hours before surgery.  No chewable tobacco products for at least 6 hours before surgery.  No nicotine patches on the day of surgery.  Do not use any "recreational" drugs for at least a week (preferably 2 weeks) before your surgery.  Please be advised that the combination of cocaine and  anesthesia may have negative outcomes, up to and including death. If you test positive for cocaine, your surgery will be cancelled.  On the morning of surgery brush your teeth with toothpaste and water, you may rinse your mouth with mouthwash if you wish. Do not swallow any toothpaste or mouthwash.  Do not wear jewelry, make-up, hairpins, clips or nail polish.  Do not wear lotions, powders, or perfumes.   Do not shave body hair from the neck down 48 hours before surgery.  Contact lenses, hearing aids and dentures may not be worn into surgery.  Do not bring valuables to the hospital. [facility] is not responsible for any missing/lost belongings or valuables. .   Notify your doctor if there is any change in your medical condition (cold, fever, infection).  Wear comfortable clothing (specific to your surgery type) to the hospital.  After surgery, you can help prevent lung complications by doing breathing exercises.  Take deep breaths and cough every 1-2 hours. Your doctor may order a device called an Incentive Spirometer to help you take deep breaths. When coughing or sneezing, hold a pillow firmly against your incision with both hands. This is called "splinting." Doing this helps protect your incision. It also decreases belly discomfort.  If you are being admitted to the hospital overnight, leave your suitcase in the car. After surgery it may be brought to your room.  In case of increased patient census, it may be necessary for you, the patient, to continue your postoperative care in the Same Day Surgery department.  If you are  being discharged the day of surgery, you will not be allowed to drive home. You will need a responsible individual to drive you home and stay with you for 24 hours after surgery.   If you are taking public transportation, you will need to have a responsible individual with you.  Please call the [facility] at [phone] if you have any  questions about these instructions.  Surgery Visitation Policy:  Patients undergoing a surgery or procedure may have two family members or support persons with them as long as the person is not COVID-19 positive or experiencing its symptoms.   Due to an increase in RSV and influenza rates and associated hospitalizations, children ages 56 and under will not be able to visit patients in [facility]. Masks continue to be strongly recommended.

## 2022-05-09 ENCOUNTER — Encounter (INDEPENDENT_AMBULATORY_CARE_PROVIDER_SITE_OTHER): Payer: Self-pay | Admitting: Vascular Surgery

## 2022-05-09 LAB — ACID FAST CULTURE WITH REFLEXED SENSITIVITIES (MYCOBACTERIA): Acid Fast Culture: NEGATIVE

## 2022-05-11 ENCOUNTER — Other Ambulatory Visit: Payer: Medicare HMO

## 2022-05-11 ENCOUNTER — Encounter: Payer: Self-pay | Admitting: Urgent Care

## 2022-05-11 DIAGNOSIS — D631 Anemia in chronic kidney disease: Secondary | ICD-10-CM | POA: Diagnosis not present

## 2022-05-11 DIAGNOSIS — I5082 Biventricular heart failure: Secondary | ICD-10-CM | POA: Diagnosis not present

## 2022-05-11 DIAGNOSIS — E1122 Type 2 diabetes mellitus with diabetic chronic kidney disease: Secondary | ICD-10-CM | POA: Diagnosis not present

## 2022-05-11 DIAGNOSIS — I130 Hypertensive heart and chronic kidney disease with heart failure and stage 1 through stage 4 chronic kidney disease, or unspecified chronic kidney disease: Secondary | ICD-10-CM | POA: Diagnosis not present

## 2022-05-11 DIAGNOSIS — N179 Acute kidney failure, unspecified: Secondary | ICD-10-CM | POA: Diagnosis not present

## 2022-05-11 DIAGNOSIS — N182 Chronic kidney disease, stage 2 (mild): Secondary | ICD-10-CM | POA: Diagnosis not present

## 2022-05-11 DIAGNOSIS — H35372 Puckering of macula, left eye: Secondary | ICD-10-CM | POA: Diagnosis not present

## 2022-05-11 DIAGNOSIS — I5043 Acute on chronic combined systolic (congestive) and diastolic (congestive) heart failure: Secondary | ICD-10-CM | POA: Diagnosis not present

## 2022-05-12 ENCOUNTER — Encounter: Payer: Self-pay | Admitting: Urgent Care

## 2022-05-12 ENCOUNTER — Ambulatory Visit
Admission: RE | Admit: 2022-05-12 | Discharge: 2022-05-12 | Disposition: A | Discharge status: Home / Self Care | Payer: Medicare HMO | Source: Ambulatory Visit | Attending: Pulmonary Disease | Admitting: Pulmonary Disease

## 2022-05-12 DIAGNOSIS — N182 Chronic kidney disease, stage 2 (mild): Secondary | ICD-10-CM | POA: Diagnosis not present

## 2022-05-12 DIAGNOSIS — I21A1 Myocardial infarction type 2: Secondary | ICD-10-CM | POA: Diagnosis not present

## 2022-05-12 DIAGNOSIS — I5082 Biventricular heart failure: Secondary | ICD-10-CM | POA: Diagnosis not present

## 2022-05-12 DIAGNOSIS — R918 Other nonspecific abnormal finding of lung field: Secondary | ICD-10-CM | POA: Diagnosis not present

## 2022-05-12 DIAGNOSIS — I5043 Acute on chronic combined systolic (congestive) and diastolic (congestive) heart failure: Secondary | ICD-10-CM | POA: Diagnosis not present

## 2022-05-12 DIAGNOSIS — J9601 Acute respiratory failure with hypoxia: Secondary | ICD-10-CM | POA: Diagnosis not present

## 2022-05-12 DIAGNOSIS — E1122 Type 2 diabetes mellitus with diabetic chronic kidney disease: Secondary | ICD-10-CM | POA: Diagnosis not present

## 2022-05-12 DIAGNOSIS — I130 Hypertensive heart and chronic kidney disease with heart failure and stage 1 through stage 4 chronic kidney disease, or unspecified chronic kidney disease: Secondary | ICD-10-CM | POA: Diagnosis not present

## 2022-05-12 DIAGNOSIS — N179 Acute kidney failure, unspecified: Secondary | ICD-10-CM | POA: Diagnosis not present

## 2022-05-12 DIAGNOSIS — D631 Anemia in chronic kidney disease: Secondary | ICD-10-CM | POA: Diagnosis not present

## 2022-05-12 LAB — GLUCOSE, CAPILLARY: Glucose-Capillary: 144 mg/dL — ABNORMAL HIGH (ref 70–99)

## 2022-05-12 MED ORDER — FLUDEOXYGLUCOSE F - 18 (FDG) INJECTION
14.5000 | Freq: Once | INTRAVENOUS | Status: AC | PRN
  Administered 2022-05-12: 14.5 via INTRAVENOUS

## 2022-05-12 NOTE — Progress Notes (Signed)
Perioperative / Anesthesia Services  Pre-Admission Testing Clinical Review / Preoperative Anesthesia Consult  Date: 05/12/22  Patient Demographics:  Name: [name] DOB:   [birth year] MRN:   [id number]  Planned Surgical Procedure(s):    Case: [id number] Date/Time: 05/15/22 1200   Procedures:      VIDEO BRONCHOSCOPY WITH ENDOBRONCHIAL ULTRASOUND     ROBOTIC ASSISTED NAVIGATIONAL BRONCHOSCOPY   Anesthesia type: General   Pre-op diagnosis: Lung Mass R91.8   Location: [place] PROCEDURE RM 02 / [place] [facility] FOR ANESTHESIA GROUP   Surgeons: Ottie Glazier, MD     NOTE: Available PAT nursing documentation and vital signs have been reviewed. Clinical nursing staff has updated patient's PMH/PSHx, current medication list, and drug allergies/intolerances to ensure comprehensive history available to assist in medical decision making as it pertains to the aforementioned surgical procedure and anticipated anesthetic course. Extensive review of available clinical information personally performed. [name] PMH and PSHx updated with any diagnoses/procedures that  may have been inadvertently omitted during his intake with the pre-admission testing department's nursing staff.  Clinical Discussion:  [name] is a 72 y.o. male who is submitted for pre-surgical anesthesia review and clearance prior to him undergoing the above procedure. Patient has never been a smoker. Pertinent PMH includes: CAD, ischemic cardiomyopathy, CHF, DOE, recurrent LEFT pleural effusion, aortic atherosclerosis, RBBB, BILATERAL carotid artery stenosis, HTN, HLD, T2DM, peripheral edema, CKD-II, chronic membranous glomerulonephritis, OSAH (s/p uvulectomy; no current nocturnal PAP therapy), IDA, OA, lumbar DDD, lumbar spinal stenosis with radiculopathy.  Patient is followed by cardiology [name], MD). He was last seen in the cardiology clinic on 12/23/2021; notes reviewed.  At the time of his clinic visit, patient reporting  increased shortness of breath and peripheral edema.  He denied any associated chest pain, PND, orthopnea, palpitations, fatigue, vertiginous symptoms, or presyncope/syncope.  Patient with a past medical history significant for cardiovascular diagnoses.  Documented physical exam was grossly benign, with the exception of the aforementioned edema (2+), providing no evidence of acute exacerbation and/or decompensation of the patient's known cardiovascular conditions.  Patient underwent diagnostic LEFT heart catheterization on 05/02/1999.  Diffuse disease noted within the mid LAD; 25% mid LAD-1, 25% mid LAD-2, and 75% mid LAD-3.  Subsequent PCI was performed placing a 2.5 x 18 mm Guidant/ACE OTW Tetra stent yielding excellent angiographic result and TIMI-3 flow.  Patient with a history of ischemic cardiomyopathy.  Ejection fraction has been serially monitored since the time of diagnosis.  Most recent TTE was performed on 04/12/2022 revealing a mildly reduced left ventricular systolic function with an EF of 45-50%.  There was global hypokinesis.  Left ventricle internal cavity moderately to severely dilated.  There was moderate concentric LVH. Left ventricular diastolic Doppler parameters consistent with pseudonormalization (G2DD).  Right ventricular size moderately enlarged with mildly reduced systolic function.  There was mild biatrial enlargement.  Trivial mitral valve regurgitation noted.  All transvalvular gradients were noted to be normal providing no evidence suggestive of valvular stenosis.  Carotid Doppler study performed on 05/07/2022 revealed mild (1-39%) stenosis of the patient's BILATERAL internal carotid arteries.  Blood pressure elevated at 158/68 mmHg on currently prescribed diuretic (spironolactone and torsemide) therapies.  Patient is on atorvastatin for his HLD diagnosis and further ASCVD prevention.  Patient is on atorvastatin for his HLD diagnosis and ASCVD prevention.  T2DM reasonably  controlled on currently prescribed regimen; last HgbA1c was 6.8% when checked on 02/17/2022.  Functional capacity somewhat limited by patient's age and multiple medical comorbidities.  With  that being said, patient still felt to be able to achieve at least 4 METS of physical activity without experiencing any significant degree of angina/anginal equivalent symptoms.  No changes were made to patient's medication regimen during his visit with cardiology.  Patient to follow-up with outpatient cardiology service in 6 months, or sooner if needed, for ongoing management of his known cardiovascular diagnoses.  Patient with an approximate 3 month history of dyspnea.  He is jointly managed by pulmonary medicine.  During visit in early January 2024, patient was found to have a large LEFT sided pleural effusion. Therapeutic thoracentesis was performed with a 1.6 L yield.  Patient was discharged home post-procedure. Cytology studies performed on pleural fluid sample were negative for malignancy.    Patient admitted through the emergency department here at [facility] on 04/11/2022 for acute on chronic hypoxemic respiratory failure in the setting of influenza A infection with recurrent LEFT-sided pleural effusion with associated compressive atelectasis.  Patient required chest tube placement, which drained 1.8 L from the left chest.  In addition to the aforementioned, CT imaging of the chest revealed a part solid/subsolid nodule in the RIGHT upper lobe measuring approximately 1.0 cm. Patient was seen in consult by pulmonary medicine Lanney Gins, MD) and nephrology service during his admission.  Patient experienced significant anemia during admission and required PRBC transfusion. Repeat imaging performed on 04/23/2022 revealed a mass in the LEFT lower pulmonary lobe suspicious for neoplasm with peripheral postobstructive changes. Patient improved clinically. Pulmonary medicine recommended  pleurX catheter placement, however patient declined. With clinical improvement, patient ultimately was discharged on 04/26/2022 following a 15 day admission.   CT imaging of the chest was repeated on 04/23/2022 demonstrating a LEFT lower lobe pulmonary mass suspicious for bronchogenic neoplasm with associated peripheral postobstructive changes or ill-defined tumor tracking peripheral to the mass.  Subsequent PET imaging was performed on 05/12/2022 revealed an 8.5 x 4.6 cm hypermetabolic mass in the LEFT lower lobe consistent with primary bronchogenic carcinoma.  Mass has a maximum SUV of 23.  There is a second focus of metabolic activity noted slightly more inferior with an equal SUV value.  Given the findings of the aforementioned testing, patient has been referred to pulmonary medicine for further evaluation and discussion regarding definitive tissue sampling for staging and treatment.  Following consultation with pulmonology, patient has been scheduled for a VIDEO BRONCHOSCOPY WITH ENDOBRONCHIAL ULTRASOUND ROBOTIC ASSISTED NAVIGATIONAL BRONCHOSCOPY on 05/15/2022 with Dr. Ottie Glazier, MD.  Given patient's past medical history significant for cardiovascular diagnoses, presurgical cardiac clearance was sought by the PAT team. Per cardiology, "this patient is optimized for surgery and may proceed with the planned procedural course with a LOW risk of significant perioperative cardiovascular complications".    In review of his medication reconciliation, it is noted that patient is currently on prescribed daily antiplatelet therapy. He has been instructed on recommendations for holding his low dose ASA for 3 days prior to his procedure with plans to restart as soon as postoperative bleeding risk felt to be minimized by his attending surgeon. The patient has been instructed that his last dose of his ASA should be on 05/11/2022.  Patient denies previous perioperative complications with anesthesia in the past. In  review of the available records, it is noted that patient underwent a MAC anesthetic course at [facility] (ASA III) in 06/2019 without documented complications.      05/07/2022    2:56 PM 04/26/2022   12:24 PM 04/26/2022  8:31 AM  Vitals with BMI  Weight 292 lbs    BMI 123456    Systolic [id number]  Diastolic 73 56 63  Pulse 92 81 84    Providers/Specialists:   NOTE: Primary physician provider listed below. Patient may have been seen by APP or partner within same practice.   PROVIDER ROLE / SPECIALTY LAST Charlynn Grimes, MD Pulmonary Medicine (Surgeon) 05/07/2022  Juluis Pitch, MD Primary Care Provider 03/13/2022  Serafina Royals, MD Cardiology 12/23/2021  Murlean Iba, MD Nephrology 04/29/2022  Lavone Orn, MD Endocrinology 02/24/2022   Allergies:  Carvedilol, Metoprolol, Toprol xl [metoprolol tartrate], Buprenorphine, Buprenorphine hcl, Morphine, Morphine and related, Triamcinolone, and Triamcinolone acetonide  Current Home Medications:   No current facility-administered medications for this encounter.    aspirin EC 81 MG tablet   atorvastatin (LIPITOR) 80 MG tablet   Continuous Blood Gluc Receiver (DEXCOM G6 RECEIVER) DEVI   Continuous Blood Gluc Sensor (DEXCOM G6 SENSOR) MISC   Continuous Blood Gluc Transmit (DEXCOM G6 TRANSMITTER) MISC   ferrous sulfate (FEROSUL) 325 (65 FE) MG tablet   insulin aspart (NOVOLOG) 100 UNIT/ML injection   insulin glargine (LANTUS) 100 UNIT/ML injection   Insulin Pen Needle (COMFORT EZ PEN NEEDLES) 32G X 8 MM MISC   NOVOLOG FLEXPEN 100 UNIT/ML FlexPen   polyethylene glycol (MIRALAX / GLYCOLAX) 17 g packet   spironolactone (ALDACTONE) 25 MG tablet   tamsulosin (FLOMAX) 0.4 MG CAPS capsule   torsemide (DEMADEX) 100 MG tablet   Vitamin D, Ergocalciferol, (DRISDOL) 1.25 MG (50000 UNIT) CAPS capsule   History:   Past Medical History:  Diagnosis Date   Aortic atherosclerosis (HCC)    Arthritis    Bilateral  carotid artery stenosis    a.) carotid doppler 05/07/2022: 1-39% BICA   CHF (congestive heart failure), NYHA class II, acute on chronic, systolic (HCC)    a.) TTE 01/09/2015: EF 35%, LVH, inf HK, LAE, triv TR/PR, mild MR, G1DD; b.) TTE 04/09/2017: EF 45%, mod LVH, post HK, triv MR/TR; c.) TTE 07/26/2018: EF 40%, LVH, inf/post HK, LAE, RVE, triv-mild panval regurg, G1DD; d.) TTE 06/01/2019: EF 40%, LVH, api/inf HK, BAE, triv MR/TR/PR, G1DD; e.) TTE 05/20/2021: EF 45%, LVH, LAE, triv MR/TR/PR; f.) TTE 04/12/2022: EF 45-50%, glob HK, BAE, triv MR, G2DD   Chronic kidney disease (CKD), stage II (mild)    Chronic membranous glomerulonephritis    Coronary artery disease    a.) LHC 05/02/1999: 25/25/75% mLAD --> 2.5 x 18 mm Guidant/ACE OTW Tetra   DDD (degenerative disc disease), lumbar    Diabetic neuropathy (HCC)    Dysplastic nevus 08/26/2020   L flank, moderate atypia   Dyspnea on exertion    Elevated troponin    History of bilateral cataract extraction    Hyperlipidemia    Hypertension    Iron deficiency anemia    Ischemic cardiomyopathy    Lymphedema    Melanoma ([place]) 08/20/2015   Right neck. Superficial spreading, arising in nevus. Tumor thickness 1.52m, Anatomic level IV   [place] 05/22/2020   R lateral neck ant to scar, exc 05/22/20   [place]    Pulmonary mass 04/23/2022   a.)  CT chest 04/23/2022: LLL mass suspicious for bronchogenic neoplasm with associated peripheral changes; b.)  PET CT [id number] Hypermetabolic LEFT lower lobe mass measuring 8.5 x 4.6 cm (SUV max 23) disease; second foci of metabolic activity slightly more inferior with similar SUV intensity.  RBBB (right bundle branch block)    Recurrent left pleural effusion    Skin cancer    removed l arm   Sleep apnea    a.) s/p uvulectomy 1990   Spinal stenosis of lumbar region with radiculopathy    Swelling of both lower extremities    T2DM (type 2 diabetes  mellitus) ([place])    a.) uses Dexcom G6 CGM   Venous ulcer of ankle [facility])    Past Surgical History:  Procedure Laterality Date   CATARACT EXTRACTION W/PHACO Right 05/03/2019   Procedure: CATARACT EXTRACTION PHACO AND INTRAOCULAR LENS PLACEMENT (IOC) RIGHT DIABETIC 6.44 00:58.4 11.0%;  Surgeon: Leandrew Koyanagi, MD;  Location: [place];  Service: Ophthalmology;  Laterality: Right;  DIABETIC   CATARACT EXTRACTION W/PHACO Left 06/28/2019   Procedure: CATARACT EXTRACTION PHACO AND INTRAOCULAR LENS PLACEMENT (IOC) LEFT DIABETIC 7.99  01:13.4  10.9% ;  Surgeon: Leandrew Koyanagi, MD;  Location: [place];  Service: Ophthalmology;  Laterality: Left;  Diabetic - insulin and oral meds   [place] Left 05/02/1999   LUMBAR LAMINECTOMY/DECOMPRESSION MICRODISCECTOMY  04/13/2012   Procedure: LUMBAR LAMINECTOMY/DECOMPRESSION MICRODISCECTOMY 1 LEVEL;  Surgeon: Johnn Hai, MD;  Location: [facility];  Service: Orthopedics;  Laterality: Bilateral;  L4-L5   TOE AMPUTATION Left    big toe   TOTAL KNEE ARTHROPLASTY Right 2007   UVULECTOMY  11/14/1988   No family history on file. Social History   Tobacco Use   Smoking status: Never   Smokeless tobacco: Never  Vaping Use   Vaping Use: Never used  Substance Use Topics   Alcohol use: Yes    Comment: rare   Drug use: No    Pertinent Clinical Results:  LABS:   Lab Results  Component Value Date   WBC 7.2 04/26/2022   HGB 7.6 (L) 04/26/2022   HCT 24.3 (L) 04/26/2022   MCV 89.0 04/26/2022   PLT 266 04/26/2022   Lab Results  Component Value Date   NA 136 04/26/2022   K 4.1 04/26/2022   CO2 30 04/26/2022   GLUCOSE 185 (H) 04/26/2022   BUN 40 (H) 04/26/2022   CREATININE 1.41 (H) 04/26/2022   CALCIUM 8.0 (L) 04/26/2022   GFRNONAA 53 (L) 04/26/2022    ECG: Date: 04/11/2022 Time ECG obtained: 2034 PM Rate: 105 bpm Rhythm:  Sinus tachycardia; RBBB Axis (leads I  and aVF): Normal Intervals: PR 135 ms. QRS 154 ms. QTc 517 ms. ST segment and T wave changes: Borderline lateral lead ST depression     IMAGING / PROCEDURES: NM PET IMAGE INITIAL (PI) SKULL BASE TO THIGH performed on [id number] Hypermetabolic mass in the LEFT lower lobe most consistent with bronchogenic carcinoma.  Mass measures 8.5 x 4.6 cm with an SUV max = 23.   A second focus of metabolic activity slightly inferior with an SUV of equal intensity noted. No evidence of metastatic adenopathy in the mediastinum or hilum Fluid and atelectasis noted in the LEFT lower lobe associated with the mass Loculated LEFT pleural and intrafissural fluid which is not metabolic No additional hypermetabolic nodules noted in the LEFT upper lobe No hypermetabolic nodules noted in the RIGHT lung No abnormal hypermetabolic activity within the liver, pancreas, adrenal glands, or spleen No hypermetabolic lymph nodes in the abdomen or pelvis No focal hypermetabolic activity to suggest skeletal metastasis  CT CHEST WO CONTRAST performed on 04/23/2022 LEFT-sided chest tube in place. Small anterior LEFT pneumothorax. Chest tube terminates  between the lingula and LEFT heart border, tip just anterior to infrahilar region on the LEFT. Correlate with tube function and with desired positioning with repositioning as needed. LEFT lower lobe mass suspicious for pulmonary neoplasm with peripheral postobstructive changes or ill-defined tumor tracking peripheral to the mass. Based on large size PET and/or biopsy is suggested. Increased density in the inferior and medial LEFT pleural space is unchanged and may reflect sequela of prior aspiration of barium are similar process, calcification in this area is also considered. There is slightly more dense nodular appearing pleural surface along the lateral LEFT chest that could be indicative of malignant effusion. Improved aeration throughout the LEFT lower lobe. Tiny juxta fissural  nodule in the RIGHT chest is nonspecific. Attention on follow-up. Three-vessel coronary artery disease with extensive coronary calcification. Aortic atherosclerosis.  TRANSTHORACIC ECHOCARDIOGRAM performed on 04/12/2022 Left ventricular ejection fraction, by estimation, is 45 to 50%. The left ventricle has mildly decreased function. The left ventricle demonstrates global hypokinesis. The left ventricular internal cavity size was moderately to severely dilated. There  is moderate concentric left ventricular hypertrophy. Left ventricular diastolic parameters are consistent with Grade II diastolic dysfunction (pseudonormalization). Right ventricular systolic function is mildly reduced. The right ventricular size is moderately enlarged. Mildly increased right ventricular wall thickness.  Left atrial size was mildly dilated.  Right atrial size was mildly dilated.  Moderate pleural effusion in the left lateral region.  The mitral valve was not well visualized. Trivial mitral valve regurgitation.  The aortic valve is grossly normal. Aortic valve regurgitation is not visualized. Aortic valve sclerosis is present, with no evidence of aortic valve stenosis.   MYOCARDIAL PERFUSION IMAGING STUDY (LEXISCAN) performed on 05/20/2021 Mildly reduced left ventricular systolic function with an EF of 40% Mild fixed inferior myocardial perfusion defect consistent with possible previous infarct scar and/or more likely diaphragmatic attenuation without evidence of myocardial ischemia. Left ventricular cavity size normal No artifact noted  Impression and Plan:  PINCHUS DISCHLER has been referred for pre-anesthesia review and clearance prior to him undergoing the planned anesthetic and procedural courses. Available labs, pertinent testing, and imaging results were personally reviewed by me in preparation for upcoming operative/procedural course. [facility] medical record has been updated following extensive record review  and patient interview with PAT staff.   This patient has been appropriately cleared by cardiology with an overall LOW risk of significant perioperative cardiovascular complications. Based on clinical review performed today (05/12/22), barring any significant acute changes in the patient's overall condition, it is anticipated that he will be able to proceed with the planned surgical intervention. Any acute changes in clinical condition may necessitate his procedure being postponed and/or cancelled. Patient will meet with anesthesia team (MD and/or CRNA) on the day of his procedure for preoperative evaluation/assessment. Questions regarding anesthetic course will be fielded at that time.   Pre-surgical instructions were reviewed with the patient during his PAT appointment, and questions were fielded to satisfaction by PAT clinical staff. He has been instructed on which medications that he will need to hold prior to surgery, as well as the ones that have been deemed safe/appropriate to take of the day of his procedure. As part of the general education provided by PAT, patient made aware both verbally and in writing, that he would need to abstain from the use of any illegal substances during his perioperative course.  He was advised that failure to follow the provided instructions could necessitate case cancellation or result serious perioperative complications up to  and including death. Patient encouraged to contact PAT and/or his surgeon's office to discuss any questions or concerns that may arise prior to surgery; verbalized understanding.   Honor Loh, MSN, APRN, FNP-C, CEN [facility]  Peri-operative Services Nurse Practitioner Phone: [phone] Fax: [phone] 05/12/22 5:16 PM  NOTE: This note has been prepared using Dragon dictation software. Despite my best ability to proofread, there is always the potential that unintentional transcriptional errors may still occur from  this process.

## 2022-05-13 ENCOUNTER — Encounter
Admission: RE | Admit: 2022-05-13 | Discharge: 2022-05-13 | Disposition: A | Discharge status: Home / Self Care | Payer: Medicare HMO | Source: Ambulatory Visit | Attending: Pulmonary Disease | Admitting: Pulmonary Disease

## 2022-05-13 DIAGNOSIS — N182 Chronic kidney disease, stage 2 (mild): Secondary | ICD-10-CM | POA: Diagnosis not present

## 2022-05-13 DIAGNOSIS — Z794 Long term (current) use of insulin: Secondary | ICD-10-CM | POA: Insufficient documentation

## 2022-05-13 DIAGNOSIS — I493 Ventricular premature depolarization: Secondary | ICD-10-CM | POA: Diagnosis not present

## 2022-05-13 DIAGNOSIS — E1165 Type 2 diabetes mellitus with hyperglycemia: Secondary | ICD-10-CM | POA: Diagnosis not present

## 2022-05-13 DIAGNOSIS — D649 Anemia, unspecified: Secondary | ICD-10-CM

## 2022-05-13 DIAGNOSIS — Z1152 Encounter for screening for COVID-19: Secondary | ICD-10-CM | POA: Insufficient documentation

## 2022-05-13 DIAGNOSIS — N179 Acute kidney failure, unspecified: Secondary | ICD-10-CM | POA: Diagnosis not present

## 2022-05-13 DIAGNOSIS — R0602 Shortness of breath: Secondary | ICD-10-CM | POA: Diagnosis not present

## 2022-05-13 DIAGNOSIS — Z01812 Encounter for preprocedural laboratory examination: Secondary | ICD-10-CM

## 2022-05-13 DIAGNOSIS — Z01818 Encounter for other preprocedural examination: Secondary | ICD-10-CM | POA: Diagnosis not present

## 2022-05-13 DIAGNOSIS — I130 Hypertensive heart and chronic kidney disease with heart failure and stage 1 through stage 4 chronic kidney disease, or unspecified chronic kidney disease: Secondary | ICD-10-CM | POA: Diagnosis not present

## 2022-05-13 DIAGNOSIS — I21A1 Myocardial infarction type 2: Secondary | ICD-10-CM | POA: Diagnosis not present

## 2022-05-13 DIAGNOSIS — Z01810 Encounter for preprocedural cardiovascular examination: Secondary | ICD-10-CM

## 2022-05-13 DIAGNOSIS — I25119 Atherosclerotic heart disease of native coronary artery with unspecified angina pectoris: Secondary | ICD-10-CM | POA: Diagnosis not present

## 2022-05-13 DIAGNOSIS — I5082 Biventricular heart failure: Secondary | ICD-10-CM | POA: Diagnosis not present

## 2022-05-13 DIAGNOSIS — E1122 Type 2 diabetes mellitus with diabetic chronic kidney disease: Secondary | ICD-10-CM | POA: Diagnosis not present

## 2022-05-13 DIAGNOSIS — I700 Atherosclerosis of aorta: Secondary | ICD-10-CM

## 2022-05-13 DIAGNOSIS — I5043 Acute on chronic combined systolic (congestive) and diastolic (congestive) heart failure: Secondary | ICD-10-CM | POA: Diagnosis not present

## 2022-05-13 DIAGNOSIS — J9601 Acute respiratory failure with hypoxia: Secondary | ICD-10-CM | POA: Diagnosis not present

## 2022-05-13 DIAGNOSIS — D631 Anemia in chronic kidney disease: Secondary | ICD-10-CM | POA: Diagnosis not present

## 2022-05-13 DIAGNOSIS — I10 Essential (primary) hypertension: Secondary | ICD-10-CM

## 2022-05-13 LAB — CBC
HCT: 28.7 % — ABNORMAL LOW (ref 39.0–52.0)
Hemoglobin: 9.2 g/dL — ABNORMAL LOW (ref 13.0–17.0)
MCH: 29.8 pg (ref 26.0–34.0)
MCHC: 32.1 g/dL (ref 30.0–36.0)
MCV: 92.9 fL (ref 80.0–100.0)
Platelets: 216 10*3/uL (ref 150–400)
RBC: 3.09 MIL/uL — ABNORMAL LOW (ref 4.22–5.81)
RDW: 16.8 % — ABNORMAL HIGH (ref 11.5–15.5)
WBC: 5.9 10*3/uL (ref 4.0–10.5)
nRBC: 0 % (ref 0.0–0.2)

## 2022-05-13 LAB — APTT: aPTT: 30 seconds (ref 24–36)

## 2022-05-13 LAB — PROTIME-INR
INR: 1.1 (ref 0.8–1.2)
Prothrombin Time: 13.9 seconds (ref 11.4–15.2)

## 2022-05-14 DIAGNOSIS — E1122 Type 2 diabetes mellitus with diabetic chronic kidney disease: Secondary | ICD-10-CM | POA: Diagnosis not present

## 2022-05-14 DIAGNOSIS — D631 Anemia in chronic kidney disease: Secondary | ICD-10-CM | POA: Diagnosis not present

## 2022-05-14 DIAGNOSIS — N182 Chronic kidney disease, stage 2 (mild): Secondary | ICD-10-CM | POA: Diagnosis not present

## 2022-05-14 DIAGNOSIS — J9601 Acute respiratory failure with hypoxia: Secondary | ICD-10-CM | POA: Diagnosis not present

## 2022-05-14 DIAGNOSIS — I130 Hypertensive heart and chronic kidney disease with heart failure and stage 1 through stage 4 chronic kidney disease, or unspecified chronic kidney disease: Secondary | ICD-10-CM | POA: Diagnosis not present

## 2022-05-14 DIAGNOSIS — N179 Acute kidney failure, unspecified: Secondary | ICD-10-CM | POA: Diagnosis not present

## 2022-05-14 DIAGNOSIS — I21A1 Myocardial infarction type 2: Secondary | ICD-10-CM | POA: Diagnosis not present

## 2022-05-14 DIAGNOSIS — I5043 Acute on chronic combined systolic (congestive) and diastolic (congestive) heart failure: Secondary | ICD-10-CM | POA: Diagnosis not present

## 2022-05-14 DIAGNOSIS — I5082 Biventricular heart failure: Secondary | ICD-10-CM | POA: Diagnosis not present

## 2022-05-14 LAB — SARS CORONAVIRUS 2 (TAT 6-24 HRS): SARS Coronavirus 2: NEGATIVE

## 2022-05-14 MED ORDER — FAMOTIDINE 20 MG PO TABS: 20.0000 mg | ORAL_TABLET | Freq: Once | ORAL | Status: AC

## 2022-05-14 MED ORDER — SODIUM CHLORIDE 0.9 % IV SOLN: INTRAVENOUS | Status: DC

## 2022-05-14 MED ORDER — CHLORHEXIDINE GLUCONATE 0.12 % MT SOLN: 15.0000 mL | Freq: Once | OROMUCOSAL | Status: AC

## 2022-05-14 MED ORDER — ORAL CARE MOUTH RINSE: 15.0000 mL | Freq: Once | OROMUCOSAL | Status: AC

## 2022-05-15 ENCOUNTER — Ambulatory Visit: Payer: Medicare HMO | Admitting: Urgent Care

## 2022-05-15 ENCOUNTER — Encounter
Admission: RE | Disposition: A | Discharge status: Home / Self Care | Payer: Self-pay | Source: Home / Self Care | Attending: Pulmonary Disease

## 2022-05-15 ENCOUNTER — Other Ambulatory Visit: Payer: Self-pay | Admitting: Pulmonary Disease

## 2022-05-15 ENCOUNTER — Other Ambulatory Visit: Payer: Self-pay

## 2022-05-15 ENCOUNTER — Ambulatory Visit: Payer: Medicare HMO

## 2022-05-15 ENCOUNTER — Ambulatory Visit
Admission: RE | Admit: 2022-05-15 | Discharge: 2022-05-15 | Disposition: A | Discharge status: Home / Self Care | Payer: Medicare HMO | Source: Ambulatory Visit | Attending: Pulmonary Disease | Admitting: Pulmonary Disease

## 2022-05-15 ENCOUNTER — Ambulatory Visit
Admission: RE | Admit: 2022-05-15 | Discharge: 2022-05-15 | Disposition: A | Discharge status: Home / Self Care | Payer: Medicare HMO | Attending: Pulmonary Disease | Admitting: Pulmonary Disease

## 2022-05-15 DIAGNOSIS — R918 Other nonspecific abnormal finding of lung field: Secondary | ICD-10-CM

## 2022-05-15 DIAGNOSIS — I5023 Acute on chronic systolic (congestive) heart failure: Secondary | ICD-10-CM | POA: Diagnosis not present

## 2022-05-15 DIAGNOSIS — I2510 Atherosclerotic heart disease of native coronary artery without angina pectoris: Secondary | ICD-10-CM | POA: Insufficient documentation

## 2022-05-15 DIAGNOSIS — Z955 Presence of coronary angioplasty implant and graft: Secondary | ICD-10-CM | POA: Diagnosis not present

## 2022-05-15 DIAGNOSIS — G4730 Sleep apnea, unspecified: Secondary | ICD-10-CM | POA: Insufficient documentation

## 2022-05-15 DIAGNOSIS — J9811 Atelectasis: Secondary | ICD-10-CM | POA: Diagnosis not present

## 2022-05-15 DIAGNOSIS — I255 Ischemic cardiomyopathy: Secondary | ICD-10-CM | POA: Diagnosis not present

## 2022-05-15 DIAGNOSIS — R590 Localized enlarged lymph nodes: Secondary | ICD-10-CM | POA: Insufficient documentation

## 2022-05-15 DIAGNOSIS — Z9049 Acquired absence of other specified parts of digestive tract: Secondary | ICD-10-CM | POA: Diagnosis not present

## 2022-05-15 DIAGNOSIS — Z96651 Presence of right artificial knee joint: Secondary | ICD-10-CM | POA: Insufficient documentation

## 2022-05-15 DIAGNOSIS — Z85828 Personal history of other malignant neoplasm of skin: Secondary | ICD-10-CM | POA: Diagnosis not present

## 2022-05-15 DIAGNOSIS — Z85820 Personal history of malignant melanoma of skin: Secondary | ICD-10-CM | POA: Diagnosis not present

## 2022-05-15 DIAGNOSIS — C3432 Malignant neoplasm of lower lobe, left bronchus or lung: Secondary | ICD-10-CM | POA: Insufficient documentation

## 2022-05-15 DIAGNOSIS — N182 Chronic kidney disease, stage 2 (mild): Secondary | ICD-10-CM | POA: Diagnosis not present

## 2022-05-15 DIAGNOSIS — Z7984 Long term (current) use of oral hypoglycemic drugs: Secondary | ICD-10-CM | POA: Diagnosis not present

## 2022-05-15 DIAGNOSIS — E1122 Type 2 diabetes mellitus with diabetic chronic kidney disease: Secondary | ICD-10-CM | POA: Insufficient documentation

## 2022-05-15 DIAGNOSIS — E785 Hyperlipidemia, unspecified: Secondary | ICD-10-CM | POA: Insufficient documentation

## 2022-05-15 DIAGNOSIS — J90 Pleural effusion, not elsewhere classified: Secondary | ICD-10-CM | POA: Diagnosis not present

## 2022-05-15 DIAGNOSIS — I130 Hypertensive heart and chronic kidney disease with heart failure and stage 1 through stage 4 chronic kidney disease, or unspecified chronic kidney disease: Secondary | ICD-10-CM | POA: Insufficient documentation

## 2022-05-15 DIAGNOSIS — Z01818 Encounter for other preprocedural examination: Secondary | ICD-10-CM

## 2022-05-15 HISTORY — DX: Unspecified right bundle-branch block: I45.10

## 2022-05-15 HISTORY — DX: Other intervertebral disc degeneration, lumbar region: M51.36

## 2022-05-15 HISTORY — DX: Type 2 diabetes mellitus without complications: E11.9

## 2022-05-15 HISTORY — DX: Other forms of dyspnea: R06.09

## 2022-05-15 HISTORY — DX: Other intervertebral disc degeneration, lumbar region without mention of lumbar back pain or lower extremity pain: M51.369

## 2022-05-15 HISTORY — PX: VIDEO BRONCHOSCOPY WITH ENDOBRONCHIAL ULTRASOUND: SHX6177

## 2022-05-15 HISTORY — DX: Cataract extraction status, right eye: Z98.41

## 2022-05-15 HISTORY — DX: Osteomyelitis, unspecified: M86.9

## 2022-05-15 HISTORY — DX: Spinal stenosis, lumbar region without neurogenic claudication: M48.061

## 2022-05-15 HISTORY — DX: Ischemic cardiomyopathy: I25.5

## 2022-05-15 HISTORY — DX: Type 2 diabetes mellitus with diabetic neuropathy, unspecified: E11.40

## 2022-05-15 LAB — GLUCOSE, CAPILLARY
Glucose-Capillary: 207 mg/dL — ABNORMAL HIGH (ref 70–99)
Glucose-Capillary: 163 mg/dL — ABNORMAL HIGH (ref 70–99)

## 2022-05-15 SURGERY — BRONCHOSCOPY, WITH EBUS
Anesthesia: General

## 2022-05-15 MED ORDER — PHENYLEPHRINE HCL (PRESSORS) 10 MG/ML IV SOLN
INTRAVENOUS | Status: DC | PRN
  Administered 2022-05-15 (×4): 100 ug via INTRAVENOUS

## 2022-05-15 MED ORDER — PROPOFOL 10 MG/ML IV BOLUS
INTRAVENOUS | Status: AC
  Filled 2022-05-15: qty 20

## 2022-05-15 MED ORDER — FENTANYL CITRATE (PF) 100 MCG/2ML IJ SOLN
INTRAMUSCULAR | Status: AC
  Filled 2022-05-15: qty 2

## 2022-05-15 MED ORDER — LIDOCAINE HCL URETHRAL/MUCOSAL 2 % EX GEL: 1.0000 | Freq: Once | CUTANEOUS | Status: DC

## 2022-05-15 MED ORDER — PHENYLEPHRINE HCL 0.25 % NA SOLN: 1.0000 | Freq: Four times a day (QID) | NASAL | Status: DC | PRN

## 2022-05-15 MED ORDER — ROCURONIUM BROMIDE 10 MG/ML (PF) SYRINGE
PREFILLED_SYRINGE | INTRAVENOUS | Status: AC
  Filled 2022-05-15: qty 10

## 2022-05-15 MED ORDER — ROCURONIUM BROMIDE 100 MG/10ML IV SOLN
INTRAVENOUS | Status: DC | PRN
  Administered 2022-05-15: 20 mg via INTRAVENOUS
  Administered 2022-05-15: 50 mg via INTRAVENOUS
  Administered 2022-05-15: 10 mg via INTRAVENOUS

## 2022-05-15 MED ORDER — ONDANSETRON HCL 4 MG/2ML IJ SOLN
INTRAMUSCULAR | Status: DC | PRN
  Administered 2022-05-15: 4 mg via INTRAVENOUS

## 2022-05-15 MED ORDER — BUTAMBEN-TETRACAINE-BENZOCAINE 2-2-14 % EX AERO
1.0000 | INHALATION_SPRAY | Freq: Once | CUTANEOUS | Status: DC
  Filled 2022-05-15: qty 20

## 2022-05-15 MED ORDER — LIDOCAINE HCL (PF) 2 % IJ SOLN
INTRAMUSCULAR | Status: AC
  Filled 2022-05-15: qty 5

## 2022-05-15 MED ORDER — LIDOCAINE HCL (PF) 1 % IJ SOLN: 30.0000 mL | Freq: Once | INTRAMUSCULAR | Status: DC

## 2022-05-15 MED ORDER — INSULIN ASPART 100 UNIT/ML IJ SOLN
7.0000 [IU] | Freq: Once | INTRAMUSCULAR | Status: AC
  Administered 2022-05-15: 7 [IU] via SUBCUTANEOUS

## 2022-05-15 MED ORDER — MIDAZOLAM HCL 2 MG/2ML IJ SOLN
INTRAMUSCULAR | Status: DC | PRN
  Administered 2022-05-15: 2 mg via INTRAVENOUS

## 2022-05-15 MED ORDER — LIDOCAINE HCL (CARDIAC) PF 100 MG/5ML IV SOSY
PREFILLED_SYRINGE | INTRAVENOUS | Status: DC | PRN
  Administered 2022-05-15: 100 mg via INTRAVENOUS

## 2022-05-15 MED ORDER — SEVOFLURANE IN SOLN
RESPIRATORY_TRACT | Status: AC
  Filled 2022-05-15: qty 250

## 2022-05-15 MED ORDER — FAMOTIDINE 20 MG PO TABS
ORAL_TABLET | ORAL | Status: AC
  Administered 2022-05-15: 20 mg via ORAL
  Filled 2022-05-15: qty 1

## 2022-05-15 MED ORDER — FENTANYL CITRATE (PF) 100 MCG/2ML IJ SOLN
INTRAMUSCULAR | Status: DC | PRN
  Administered 2022-05-15 (×2): 50 ug via INTRAVENOUS

## 2022-05-15 MED ORDER — INSULIN ASPART 100 UNIT/ML IJ SOLN
INTRAMUSCULAR | Status: AC
  Filled 2022-05-15: qty 1

## 2022-05-15 MED ORDER — SUGAMMADEX SODIUM 500 MG/5ML IV SOLN
INTRAVENOUS | Status: AC
  Filled 2022-05-15: qty 5

## 2022-05-15 MED ORDER — MIDAZOLAM HCL 2 MG/2ML IJ SOLN
INTRAMUSCULAR | Status: AC
  Filled 2022-05-15: qty 2

## 2022-05-15 MED ORDER — PROPOFOL 10 MG/ML IV BOLUS
INTRAVENOUS | Status: DC | PRN
  Administered 2022-05-15: 130 mg via INTRAVENOUS

## 2022-05-15 MED ORDER — CHLORHEXIDINE GLUCONATE 0.12 % MT SOLN
OROMUCOSAL | Status: AC
  Administered 2022-05-15: 15 mL via OROMUCOSAL
  Filled 2022-05-15: qty 15

## 2022-05-15 MED ORDER — DEXAMETHASONE SODIUM PHOSPHATE 10 MG/ML IJ SOLN
INTRAMUSCULAR | Status: AC
  Filled 2022-05-15: qty 1

## 2022-05-15 MED ORDER — ONDANSETRON HCL 4 MG/2ML IJ SOLN
INTRAMUSCULAR | Status: AC
  Filled 2022-05-15: qty 2

## 2022-05-15 MED ORDER — PHENYLEPHRINE 80 MCG/ML (10ML) SYRINGE FOR IV PUSH (FOR BLOOD PRESSURE SUPPORT)
PREFILLED_SYRINGE | INTRAVENOUS | Status: AC
  Filled 2022-05-15: qty 10

## 2022-05-15 MED ORDER — SUGAMMADEX SODIUM 500 MG/5ML IV SOLN
INTRAVENOUS | Status: DC | PRN
  Administered 2022-05-15: 496 mg via INTRAVENOUS

## 2022-05-15 NOTE — Transfer of Care (Signed)
Immediate Anesthesia Transfer of Care Note  Patient: [name]  Procedure(s) Performed: VIDEO BRONCHOSCOPY WITH ENDOBRONCHIAL ULTRASOUND ROBOTIC ASSISTED NAVIGATIONAL BRONCHOSCOPY  Patient Location: PACU  Anesthesia Type:General  Level of Consciousness: drowsy and patient cooperative  Airway & Oxygen Therapy: Patient Spontanous Breathing and Patient connected to nasal cannula oxygen  Post-op Assessment: Report given to RN and Post -op Vital signs reviewed and stable  Post vital signs: Reviewed and stable  Last Vitals:  Vitals Value Taken Time  BP 112/58 05/15/22 1454  Temp 35.9 C 05/15/22 1454  Pulse 74 05/15/22 1458  Resp 16 05/15/22 1458  SpO2 100 % 05/15/22 1458  Vitals shown include unvalidated device data.  Last Pain:  Vitals:   05/15/22 1454  TempSrc:   PainSc: Asleep         Complications: No notable events documented.

## 2022-05-15 NOTE — Anesthesia Procedure Notes (Signed)
Procedure Name: Intubation Date/Time: 05/15/2022 12:40 PM  Performed by: Gillie Fleites, [place], CRNAPre-anesthesia Checklist: Patient identified, Patient being monitored, Timeout performed, Emergency Drugs available and Suction available Patient Re-evaluated:Patient Re-evaluated prior to induction Oxygen Delivery Method: Circle system utilized Preoxygenation: Pre-oxygenation with 100% oxygen Induction Type: IV induction Ventilation: Mask ventilation without difficulty Laryngoscope Size: 4 and McGraph Grade View: Grade I Tube type: Oral Tube size: 9.0 mm Number of attempts: 1 Airway Equipment and Method: Stylet Placement Confirmation: ETT inserted through vocal cords under direct vision, positive ETCO2 and breath sounds checked- equal and bilateral Secured at: 24 cm Tube secured with: Tape Dental Injury: Teeth and Oropharynx as per pre-operative assessment

## 2022-05-15 NOTE — Procedures (Signed)
ELECTROMAGNETIC NAVIGATIONAL BRONCHOSCOPY PROCEDURE NOTE  FIBEROPTIC BRONCHOSCOPY WITH THERAPEUTIC ASPIRATION OF TRACHEOBRONCHIAL TREE AND BRONCHOALVEOLAR LAVAGE PROCEDURE NOTE  ENDOBRONCHIAL ULTRASOUND PROCEDURE WITH >3 LYMPH NODES SAMPLING PROCEDURE NOTE    Flexible bronchoscopy was performed  by : Lanney Gins MD  assistance by : 1)Repiratory therapist  and 2)[employer] staff and 3) Anesthesia team and 4) Flouroscopy team and 5) [facility]orting staff   Indication for the procedure was :  Pre-procedural H&P. The following assessment was performed on the day of the procedure prior to initiating sedation History:  Chest pain n Dyspnea y Hemoptysis n Cough y Fever n Other pertinent items n  Examination Vital signs -reviewed as per nursing documentation today Cardiac    Murmurs: n  Rubs : n  Gallop: n Lungs Wheezing: n Rales : n Rhonchi :y  Other pertinent findings: SOB/hypoxemia due to chronic lung disease   Pre-procedural assessment for Procedural Sedation included: Depth of sedation: As per anesthesia team  ASA Classification:  2 Mallampati airway assessment: 3    Medication list reviewed: y  The patient's interval history was taken and revealed: no new complaints The pre- procedure physical examination revealed: No new findings Refer to prior clinic note for details.  Informed Consent: Informed consent was obtained from:  patient after explanation of procedure and risks, benefits, as well as alternative procedures available.  Explanation of level of sedation and possible transfusion was also provided.    Procedural Preparation: Time out was performed and patient was identified by name and birthdate and procedure to be performed and side for sampling, if any, was specified. Pt was intubated by anesthesia.  The patient was appropriately draped.   Fiberoptic bronchoscopy with airway inspection and BAL Procedure findings:  Bronchoscope was inserted via  ETT  without difficulty.  Posterior oropharynx, epiglottis, arytenoids, false cords and vocal cords were not visualized as these were bypassed by endotracheal tube. The distal trachea was normal in circumference and appearance without mucosal, cartilaginous or branching abnormalities.  The main carina was mildly splayed . All right and left lobar airways were visualized to the Subsegmental level.  Sub- sub segmental carinae were identified in all the distal airways.   Secretions were visible in the following airways and appeared to be clear.  The mucosa was : friable at left lower lobe   Airways were notable for:        exophytic lesions :n       extrinsic compression in the following distributions: n.       Friable mucosa: y       Neurosurgeon /pigmentation: n   Therapeutic aspiration of tracheobronchial tree performed at left lower lobe to visualize airways and tumor  Post procedure Diagnosis:   left lower lobe friable mucosa with mucus plugging.     Electromagnetic Navigational Bronchoscopy Procedure Findings:  After appropriate CT-guided planning ENB scope was advanced via endotracheal tube and [facility] bronchoscopy was advanced for registration.  Post appropriate planning and registration peripheral navigation was used to visualize target lesion.    Post procedure diagnosis:   Cytobrush x 1 = atypical cells  Surgical endobronchial biopsy x 14 = lesional cancer cells     Endobronchial ultrasound assisted hilar and mediastinal lymph node biopsies procedure findings: The fiberoptic bronchoscope was removed and the EBUS scope was introduced. Examination began to evaluate for pathologically enlarged lymph nodes starting on the right  side progressing to the left side.  All lymph node biopsies performed with 21g needle. Lymph node biopsies were  sent in cytolite for all stations.   Lymph node station 7 - 1.7 cm - 3 biopsies Lymph node station 10L - 1.5cm - 3 biopsies Lymph node  station 11L - 1.2cm 4 biopsies   Post procedure diagnosis: Suspicious lymphadenopathy of hilar and mediastinal lymph nodes   Specimens obtained included:          Cytology brushes : LLLx1  Broncho-alveolar lavage site:LLL  sent for cytology                              100 ml volume infused 60 ml volume returned with serosang with cellular material appearance  Endobronchial biopsy site:  LLL; sent for cytology              Immediate sampling complications included:none  Epinephrine none ml was used topically  The bronchoscopy was terminated due to completion of the planned procedure and the bronchoscope was removed.   Total dosage of Lidocaine was none mg Total fluoroscopy time was as per rads minutes  Supplemental oxygen was provided at as per anesthesia lpm by nasal canula post operatively  Estimated Blood loss: expected <10cc.  Complications included:  None immediate   Preliminary CXR findings :  In process  Disposition: home with family   Follow up with Dr. Lanney Gins in 5 days for result discussion.     Ottie Glazier MD  [place] Division of Pulmonary & Critical Care Medicine

## 2022-05-15 NOTE — Anesthesia Preprocedure Evaluation (Addendum)
Anesthesia Evaluation  Patient identified by MRN, date of birth, ID band Patient awake    Reviewed: Allergy & Precautions, H&P , NPO status , Patient's Chart, lab work & pertinent test results  Airway Mallampati: II  TM Distance: >3 FB Neck ROM: full    Dental no notable dental hx.    Pulmonary sleep apnea (STOP BANG 4)  Lung Mass Nuclear Med: IMPRESSION: 1. Hypermetabolic mass in the LEFT lower lobe most consistent with bronchogenic carcinoma. 2. No evidence of metastatic adenopathy in the mediastinum or hilum. 3. No evidence of distant metastatic disease.    Pulmonary exam normal        Cardiovascular hypertension, (-) angina + CAD, + Cardiac Stents (2000) and +CHF  Normal cardiovascular exam+ dysrhythmias (RBBB)      Neuro/Psych negative neurological ROS  negative psych ROS   GI/Hepatic negative GI ROS, Neg liver ROS,,,  Endo/Other  diabetes, Insulin Dependent    Renal/GU Renal InsufficiencyRenal disease     Musculoskeletal  (+) Arthritis ,    Abdominal   Peds  Hematology  (+) Blood dyscrasia, anemia   Anesthesia Other Findings Past Medical History: No date: Aortic atherosclerosis (HCC) No date: Arthritis No date: Bilateral carotid artery stenosis     Comment:  a.) carotid doppler 05/07/2022: 1-39% BICA No date: CHF (congestive heart failure), NYHA class II, acute on  chronic, systolic (HCC)     Comment:  a.) TTE 01/09/2015: EF 35%, LVH, inf HK, LAE, triv               TR/PR, mild MR, G1DD; b.) TTE 04/09/2017: EF 45%, mod               LVH, post HK, triv MR/TR; c.) TTE 07/26/2018: EF 40%,               LVH, inf/post HK, LAE, RVE, triv-mild panval regurg,               G1DD; d.) TTE 06/01/2019: EF 40%, LVH, api/inf HK, BAE,               triv MR/TR/PR, G1DD; e.) TTE 05/20/2021: EF 45%, LVH,               LAE, triv MR/TR/PR; f.) TTE 04/12/2022: EF 45-50%, glob               HK, BAE, triv MR, G2DD No  date: Chronic kidney disease (CKD), stage II (mild) No date: Chronic membranous glomerulonephritis No date: Coronary artery disease     Comment:  a.) LHC 05/02/1999: 25/25/75% mLAD --> 2.5 x 18 mm               Guidant/ACE OTW Tetra No date: DDD (degenerative disc disease), lumbar No date: Diabetic neuropathy ([place]) 08/26/2020: Dysplastic nevus     Comment:  L flank, moderate atypia No date: Dyspnea on exertion No date: Elevated troponin No date: History of bilateral cataract extraction No date: Hyperlipidemia No date: Hypertension No date: Iron deficiency anemia No date: Ischemic cardiomyopathy No date: Lymphedema 08/20/2015: Melanoma ([place])     Comment:  Right neck. Superficial spreading, arising in nevus.               Tumor thickness 1.78m, Anatomic level IV 05/22/2020: Melanoma in situ ([facility]     Comment:  R lateral neck ant to scar, exc 05/22/20 No date: Osteomyelitis of great toe of left foot [place] 04/23/2022: Pulmonary mass     Comment:  a.)  CT chest 04/23/2022: LLL mass suspicious for               bronchogenic neoplasm with associated peripheral changes;              b.)  PET CT 123XX123: Hypermetabolic LEFT lower lobe               mass measuring 8.5 x 4.6 cm (SUV max 23) disease; second               foci of metabolic activity slightly more inferior with               similar SUV intensity. No date: RBBB (right bundle branch block) No date: Recurrent left pleural effusion No date: Skin cancer     Comment:  removed l arm No date: Sleep apnea     Comment:  a.) s/p uvulectomy 1990 No date: Spinal stenosis of lumbar region with radiculopathy No date: Swelling of both lower extremities No date: T2DM (type 2 diabetes mellitus) ([place])     Comment:  a.) uses Dexcom G6 CGM No date: Venous ulcer of ankle ([place])  Past Surgical History: 05/03/2019: CATARACT EXTRACTION W/PHACO; Right     Comment:  Procedure: CATARACT EXTRACTION PHACO AND INTRAOCULAR               LENS  PLACEMENT (IOC) RIGHT DIABETIC 6.44 00:58.4 11.0%;                Surgeon: Leandrew Koyanagi, MD;  Location: [place];  Service: Ophthalmology;  Laterality:               Right;  DIABETIC 06/28/2019: CATARACT EXTRACTION W/PHACO; Left     Comment:  Procedure: CATARACT EXTRACTION PHACO AND INTRAOCULAR               LENS PLACEMENT (IOC) LEFT DIABETIC 7.99  01:13.4  10.9% ;              Surgeon: Leandrew Koyanagi, MD;  Location: [place];  Service: Ophthalmology;  Laterality: Left;              Diabetic - insulin and oral meds 1985: CHOLECYSTECTOMY 05/02/1999: CORONARY ANGIOPLASTY WITH STENT PLACEMENT; Left 04/13/2012: LUMBAR LAMINECTOMY/DECOMPRESSION MICRODISCECTOMY     Comment:  Procedure: LUMBAR LAMINECTOMY/DECOMPRESSION               MICRODISCECTOMY 1 LEVEL;  Surgeon: Johnn Hai, MD;                [facility];  Service: Orthopedics;  Laterality:               Bilateral;  L4-L5 No date: TOE AMPUTATION; Left     Comment:  big toe 2007: TOTAL KNEE ARTHROPLASTY; Right 11/14/1988: UVULECTOMY     Reproductive/Obstetrics negative OB ROS                             Anesthesia Physical Anesthesia Plan  ASA: 3  Anesthesia Plan: General ETT   Post-op Pain Management: Minimal or no pain anticipated   Induction: Intravenous  PONV Risk Score and Plan: 2 and Ondansetron, Dexamethasone and Midazolam  Airway Management Planned: Oral ETT  Additional Equipment:   Intra-op Plan:   Post-operative Plan: Extubation in  OR  Informed Consent: I have reviewed the patients History and Physical, chart, labs and discussed the procedure including the risks, benefits and alternatives for the proposed anesthesia with the patient or authorized representative who has indicated his/her understanding and acceptance.     Dental Advisory Given  Plan Discussed with: CRNA and Surgeon  Anesthesia Plan Comments:          Anesthesia Quick Evaluation

## 2022-05-15 NOTE — Discharge Instructions (Signed)

## 2022-05-15 NOTE — H&P (Signed)
PULMONOLOGY         Date: 05/15/2022,   MRN# [id number] [name] 1950-11-21     AdmissionWeight: 124 kg                 CurrentWeight: 124 kg     CHIEF COMPLAINT:   Left lung mass with mediastinal lymphadenopathy   HISTORY OF PRESENT ILLNESS   This is a 72 yo M with hx of CHF, CKD with membranous GN, CAD, history of skin cancer and recent hospitalization with hypoxemia due to large recurrent left pleural effusion with associated compressive atelectasis and Anasarca with worsening CKD.  He was medically treated and improved substantially with DC from hospital not needing oxygen therapy.  He was noted to have lung mass of LLL and this was followed up with PET scan. PET Scan significant for Hypermetabolic mass in the LEFT lower lobe measures 8.5 x 4.6 cm with SUV max equal 23.  He is here today for evaluation of this mass with robotic bronchoscopy.   Reviewed risks/complications and benefits with patient, risks include infection, pneumothorax/pneumomediastinum which may require chest tube placement as well as overnight/prolonged hospitalization and possible mechanical ventilation. Other risks include bleeding and very rarely death.  Patient understands risks and wishes to proceed.  Additional questions were answered, and patient is aware that post procedure patient will be going home with family and may experience cough with possible clots on expectoration as well as phlegm which may last few days as well as hoarseness of voice post intubation and mechanical ventilation.     PAST MEDICAL HISTORY   Past Medical History:  Diagnosis Date   Aortic atherosclerosis ([place])    Arthritis    Bilateral carotid artery stenosis    a.) carotid doppler 05/07/2022: 1-39% BICA   CHF (congestive heart failure), NYHA class II, acute on chronic, systolic (HCC)    a.) TTE 01/09/2015: EF 35%, LVH, inf HK, LAE, triv TR/PR, mild MR, G1DD; b.) TTE 04/09/2017: EF 45%, mod LVH, post HK, triv  MR/TR; c.) TTE 07/26/2018: EF 40%, LVH, inf/post HK, LAE, RVE, triv-mild panval regurg, G1DD; d.) TTE 06/01/2019: EF 40%, LVH, api/inf HK, BAE, triv MR/TR/PR, G1DD; e.) TTE 05/20/2021: EF 45%, LVH, LAE, triv MR/TR/PR; f.) TTE 04/12/2022: EF 45-50%, glob HK, BAE, triv MR, G2DD   Chronic kidney disease (CKD), stage II (mild)    Chronic membranous glomerulonephritis    Coronary artery disease    a.) LHC 05/02/1999: 25/25/75% mLAD --> 2.5 x 18 mm Guidant/ACE OTW Tetra   DDD (degenerative disc disease), lumbar    Diabetic neuropathy (HCC)    Dysplastic nevus 08/26/2020   L flank, moderate atypia   Dyspnea on exertion    Elevated troponin    History of bilateral cataract extraction    Hyperlipidemia    Hypertension    Iron deficiency anemia    Ischemic cardiomyopathy    Lymphedema    Melanoma ([place]) 08/20/2015   Right neck. Superficial spreading, arising in nevus. Tumor thickness 1.4m, Anatomic level IV   [place]anoma in situ ([place] 05/22/2020   R lateral neck ant to scar, exc 05/22/20   [place]omyel[place] of left [place]    Pulmonary mass 04/23/2022   a.)  CT chest 04/23/2022: LLL mass suspicious for bronchogenic neoplasm with associated peripheral changes; b.)  PET CT [id number] Hypermetabolic LEFT lower lobe mass measuring 8.5 x 4.6 cm (SUV max 23) disease; second foci of metabolic activity slightly more inferior with  similar SUV intensity.   RBBB (right bundle branch block)    Recurrent left pleural effusion    Skin cancer    removed l arm   Sleep apnea    a.) s/p uvulectomy 1990   Spinal stenosis of lumbar region with radiculopathy    Swelling of both lower extremities    T2DM (type 2 diabetes mellitus) ([place])    a.) uses Dexcom G6 CGM   Venous ulcer of ankle [facility])      SURGICAL HISTORY   Past Surgical History:  Procedure Laterality Date   CATARACT EXTRACTION W/PHACO Right 05/03/2019   Procedure: CATARACT EXTRACTION PHACO AND INTRAOCULAR LENS PLACEMENT (IOC) RIGHT  DIABETIC 6.44 00:58.4 11.0%;  Surgeon: Leandrew Koyanagi, MD;  Location: [place];  Service: Ophthalmology;  Laterality: Right;  DIABETIC   CATARACT EXTRACTION W/PHACO Left 06/28/2019   Procedure: CATARACT EXTRACTION PHACO AND INTRAOCULAR LENS PLACEMENT (IOC) LEFT DIABETIC 7.99  01:13.4  10.9% ;  Surgeon: Leandrew Koyanagi, MD;  Location: [place];  Service: Ophthalmology;  Laterality: Left;  Diabetic - insulin and oral meds   [place] Left 05/02/1999   LUMBAR LAMINECTOMY/DECOMPRESSION MICRODISCECTOMY  04/13/2012   Procedure: LUMBAR LAMINECTOMY/DECOMPRESSION MICRODISCECTOMY 1 LEVEL;  Surgeon: Johnn Hai, MD;  [facility];  Service: Orthopedics;  Laterality: Bilateral;  L4-L5   TOE AMPUTATION Left    big toe   TOTAL KNEE ARTHROPLASTY Right 2007   UVULECTOMY  11/14/1988     FAMILY HISTORY   History reviewed. No pertinent family history.   SOCIAL HISTORY   Social History   Tobacco Use   Smoking status: Never   Smokeless tobacco: Never  Vaping Use   Vaping Use: Never used  Substance Use Topics   Alcohol use: Yes    Comment: rare   Drug use: No     MEDICATIONS    Home Medication:    Current Medication:  Current Facility-Administered Medications:    0.9 %  sodium chloride infusion, , Intravenous, Continuous, Darrin Nipper, MD   butamben-tetracaine-benzocaine (CETACAINE) spray 1 spray, 1 spray, Topical, Once, Romond Pipkins, MD   chlorhexidine (PERIDEX) 0.12 % solution 15 mL, 15 mL, Mouth/Throat, Once **OR** Oral care mouth rinse, 15 mL, Mouth Rinse, Once, Darrin Nipper, MD   famotidine (PEPCID) tablet 20 mg, 20 mg, Oral, Once, Honor Loh E, NP   lidocaine (PF) (XYLOCAINE) 1 % injection 30 mL, 30 mL, Infiltration, Once, Mariadelosang Wynns, MD   lidocaine (XYLOCAINE) 2 % jelly 1 Application, 1 Application, Topical, Once, Daruis Swaim, MD   phenylephrine (NEO-SYNEPHRINE) 0.25 %  nasal spray 1 spray, 1 spray, Each Nare, Q6H PRN, Ottie Glazier, MD    ALLERGIES   Carvedilol, Metoprolol, Toprol xl [metoprolol tartrate], Buprenorphine, Buprenorphine hcl, Morphine, Morphine and related, Triamcinolone, and Triamcinolone acetonide     REVIEW OF SYSTEMS    Review of Systems:  Gen:  Denies  fever, sweats, chills weigh loss  HEENT: Denies blurred vision, double vision, ear pain, eye pain, hearing loss, nose bleeds, sore throat Cardiac:  No dizziness, chest pain or heaviness, chest tightness,edema Resp:   reports dyspnea chronically  Gi: Denies swallowing difficulty, stomach pain, nausea or vomiting, diarrhea, constipation, bowel incontinence Gu:  Denies bladder incontinence, burning urine Ext:   Denies Joint pain, stiffness or swelling Skin: Denies  skin rash, easy bruising or bleeding or hives Endoc:  Denies polyuria, polydipsia , polyphagia or weight change Psych:   Denies depression, insomnia or hallucinations  Other:  All other systems negative   VS: BP 138/72   Pulse 84   Temp (!) 97.2 F (36.2 C) (Temporal)   Resp 18   Ht 6' (1.829 m)   Wt 124 kg   SpO2 99%   BMI 37.08 kg/m      PHYSICAL EXAM    GENERAL:NAD, no fevers, chills, no weakness no fatigue HEAD: Normocephalic, atraumatic.  EYES: Pupils equal, round, reactive to light. Extraocular muscles intact. No scleral icterus.  MOUTH: Moist mucosal membrane. Dentition intact. No abscess noted.  EAR, NOSE, THROAT: Clear without exudates. No external lesions.  NECK: Supple. No thyromegaly. No nodules. No JVD.  PULMONARY: decreased breath sounds with mild rhonchi worse at bases bilaterally.  CARDIOVASCULAR: S1 and S2. Regular rate and rhythm. No murmurs, rubs, or gallops. No edema. Pedal pulses 2+ bilaterally.  GASTROINTESTINAL: Soft, nontender, nondistended. No masses. Positive bowel sounds. No hepatosplenomegaly.  MUSCULOSKELETAL: No swelling, clubbing, or edema. Range of motion full in all  extremities.  NEUROLOGIC: Cranial nerves II through XII are intact. No gross focal neurological deficits. Sensation intact. Reflexes intact.  SKIN: No ulceration, lesions, rashes, or cyanosis. Skin warm and dry. Turgor intact.  PSYCHIATRIC: Mood, affect within normal limits. The patient is awake, alert and oriented x 3. Insight, judgment intact.       IMAGING   Left lower lobe mass is hypermetabolic, hilar and subcarinal /mediastinal lymph nodes appear enlarged on most recent CT  ASSESSMENT/PLAN   Left lower lobe lung mass with hilar/mediastinal lymphadenopathy     - plan for lung biopsy with [facility] robotic system and EBUS      - patient has no new complaints and is agreeable to procedure today      - Reviewed risks/complications and benefits with patient, risks include infection, pneumothorax/pneumomediastinum which may require chest tube placement as well as overnight/prolonged hospitalization and possible mechanical ventilation. Other risks include bleeding and very rarely death.  Patient understands risks and wishes to proceed.  Additional questions were answered, and patient is aware that post procedure patient will be going home with family and may experience cough with possible clots on expectoration as well as phlegm which may last few days as well as hoarseness of voice post intubation and mechanical ventilation.          Thank you for allowing me to participate in the care of this patient.   Patient/Family are satisfied with care plan and all questions have been answered.    Provider disclosure: Patient with at least one acute or chronic illness or injury that poses a threat to life or bodily function and is being managed actively during this encounter.  All of the below services have been performed independently by signing provider:  review of prior documentation from internal and or external health records.  Review of previous and current lab results.  Interview and  comprehensive assessment during patient visit today. Review of current and previous chest radiographs/CT scans. Discussion of management and test interpretation with health care team and patient/family.   This document was prepared using Dragon voice recognition software and may include unintentional dictation errors.     Ottie Glazier, M.D.  Division of Pulmonary & Critical Care Medicine

## 2022-05-16 LAB — ACID FAST CULTURE WITH REFLEXED SENSITIVITIES (MYCOBACTERIA): Acid Fast Culture: NEGATIVE

## 2022-05-16 NOTE — Anesthesia Postprocedure Evaluation (Signed)
Anesthesia Post Note  Patient: [name]  Procedure(s) Performed: VIDEO BRONCHOSCOPY WITH ENDOBRONCHIAL ULTRASOUND ROBOTIC ASSISTED NAVIGATIONAL BRONCHOSCOPY  Patient location during evaluation: PACU Anesthesia Type: General Level of consciousness: awake and alert Pain management: pain level controlled Vital Signs Assessment: post-procedure vital signs reviewed and stable Respiratory status: spontaneous breathing, nonlabored ventilation, respiratory function stable and patient connected to nasal cannula oxygen Cardiovascular status: blood pressure returned to baseline and stable Postop Assessment: no apparent nausea or vomiting Anesthetic complications: no   No notable events documented.   Last Vitals:  Vitals:   05/15/22 1545 05/15/22 1613  BP: 125/67 (!) 118/56  Pulse: 77 81  Resp: 15 16  Temp: 36.6 C (!) 36.2 C  SpO2: 98% 96%    Last Pain:  Vitals:   05/15/22 1613  TempSrc: Temporal  PainSc: 0-No pain                 Ilene Qua

## 2022-05-18 ENCOUNTER — Encounter: Payer: Self-pay | Admitting: Pulmonary Disease

## 2022-05-19 DIAGNOSIS — D631 Anemia in chronic kidney disease: Secondary | ICD-10-CM | POA: Diagnosis not present

## 2022-05-19 DIAGNOSIS — N182 Chronic kidney disease, stage 2 (mild): Secondary | ICD-10-CM | POA: Diagnosis not present

## 2022-05-19 DIAGNOSIS — I5082 Biventricular heart failure: Secondary | ICD-10-CM | POA: Diagnosis not present

## 2022-05-19 DIAGNOSIS — I21A1 Myocardial infarction type 2: Secondary | ICD-10-CM | POA: Diagnosis not present

## 2022-05-19 DIAGNOSIS — I5043 Acute on chronic combined systolic (congestive) and diastolic (congestive) heart failure: Secondary | ICD-10-CM | POA: Diagnosis not present

## 2022-05-19 DIAGNOSIS — J9601 Acute respiratory failure with hypoxia: Secondary | ICD-10-CM | POA: Diagnosis not present

## 2022-05-19 DIAGNOSIS — N179 Acute kidney failure, unspecified: Secondary | ICD-10-CM | POA: Diagnosis not present

## 2022-05-19 DIAGNOSIS — I130 Hypertensive heart and chronic kidney disease with heart failure and stage 1 through stage 4 chronic kidney disease, or unspecified chronic kidney disease: Secondary | ICD-10-CM | POA: Diagnosis not present

## 2022-05-19 DIAGNOSIS — E1122 Type 2 diabetes mellitus with diabetic chronic kidney disease: Secondary | ICD-10-CM | POA: Diagnosis not present

## 2022-05-20 ENCOUNTER — Telehealth: Payer: Self-pay

## 2022-05-20 DIAGNOSIS — I890 Lymphedema, not elsewhere classified: Secondary | ICD-10-CM | POA: Diagnosis not present

## 2022-05-20 DIAGNOSIS — M79672 Pain in left foot: Secondary | ICD-10-CM | POA: Diagnosis not present

## 2022-05-20 DIAGNOSIS — L97522 Non-pressure chronic ulcer of other part of left foot with fat layer exposed: Secondary | ICD-10-CM | POA: Diagnosis not present

## 2022-05-20 DIAGNOSIS — I5022 Chronic systolic (congestive) heart failure: Secondary | ICD-10-CM

## 2022-05-20 DIAGNOSIS — L853 Xerosis cutis: Secondary | ICD-10-CM | POA: Diagnosis not present

## 2022-05-20 DIAGNOSIS — E1142 Type 2 diabetes mellitus with diabetic polyneuropathy: Secondary | ICD-10-CM | POA: Diagnosis not present

## 2022-05-20 DIAGNOSIS — B351 Tinea unguium: Secondary | ICD-10-CM | POA: Diagnosis not present

## 2022-05-20 NOTE — Telephone Encounter (Signed)
Spoke with patient, states he was just discharged from home PT as of yesterday and would like to resume back  pulmonary rehab sessions. Would like to switch class times- will call back next week once a spot opens.

## 2022-05-21 DIAGNOSIS — E1142 Type 2 diabetes mellitus with diabetic polyneuropathy: Secondary | ICD-10-CM | POA: Diagnosis not present

## 2022-05-22 ENCOUNTER — Other Ambulatory Visit: Payer: Self-pay | Admitting: Anatomic Pathology & Clinical Pathology

## 2022-05-22 ENCOUNTER — Inpatient Hospital Stay: Payer: Medicare HMO | Admitting: Oncology

## 2022-05-22 LAB — CYTOLOGY - NON PAP

## 2022-05-22 LAB — SURGICAL PATHOLOGY

## 2022-05-25 ENCOUNTER — Inpatient Hospital Stay: Payer: Medicare HMO | Admitting: Oncology

## 2022-05-25 DIAGNOSIS — I5022 Chronic systolic (congestive) heart failure: Secondary | ICD-10-CM

## 2022-05-25 NOTE — Progress Notes (Signed)
Discharge Progress Report  Patient Details  Name: [name] MRN: [id number] Date of Birth: [birth year] Referring Provider:   Flowsheet Row Pulmonary Rehab from 02/02/2022 in [facility]  Referring Provider [name]        Number of Visits: 5  Reason for Discharge:  Early Exit:  Medical- hold off on rehab per Dr. Lanney Gins  Smoking History:  Social History   Tobacco Use  Smoking Status Never  Smokeless Tobacco Never    Diagnosis:  Heart failure, chronic systolic ([place])   Functional Capacity:  6 Minute Walk     Row Name 02/02/22 1701         6 Minute Walk   Phase Initial     Distance 480 feet     Walk Time 5.94 minutes     # of Rest Breaks 2     MPH 0.91     METS 0.8     RPE 15     Perceived Dyspnea  3     VO2 Peak 3     Symptoms No     Resting HR 96 bpm     Resting BP 158/90     Resting Oxygen Saturation  94 %     Exercise Oxygen Saturation  during 6 min walk 90 %     Max Ex. HR 120 bpm     Max Ex. BP 160/80     2 Minute Post BP 140/70       Interval HR   1 Minute HR 98     2 Minute HR 110     3 Minute HR 112     4 Minute HR 118     5 Minute HR 119     6 Minute HR 120     2 Minute Post HR 100     Interval Heart Rate? Yes       Interval Oxygen   Interval Oxygen? Yes     Baseline Oxygen Saturation % 94 %     1 Minute Oxygen Saturation % 90 %     1 Minute Liters of Oxygen 0 L     2 Minute Oxygen Saturation % 90 %     2 Minute Liters of Oxygen 0 L     3 Minute Oxygen Saturation % 91 %     3 Minute Liters of Oxygen 0 L     4 Minute Oxygen Saturation % 92 %     4 Minute Liters of Oxygen 0 L     5 Minute Oxygen Saturation % 92 %     5 Minute Liters of Oxygen 0 L     6 Minute Oxygen Saturation % 93 %     6 Minute Liters of Oxygen 0 L     2 Minute Post Oxygen Saturation % 98 %     2 Minute Post Liters of Oxygen 0 L              Nutrition & Weight - Outcomes:  Pre Biometrics - 02/02/22 1714       Pre Biometrics    Height 5' 10.75" (1.797 m)    Weight 307 lb 11.2 oz (139.6 kg)    Waist Circumference 56 inches    Hip Circumference 54 inches    Waist to Hip Ratio 1.04 %    BMI (Calculated) 43.22    Single Leg Stand 0 seconds

## 2022-05-25 NOTE — Progress Notes (Signed)
Pulmonary Individual Treatment Plan  Patient Details  Name: [name] MRN: [id number] Date of Birth: 06-28-1950 Referring Provider:   Flowsheet Row Pulmonary Rehab from 02/02/2022 in [facility] Cardiac and Pulmonary Rehab  Referring Provider [name]       Initial Encounter Date:  Flowsheet Row Pulmonary Rehab from 02/02/2022 in [facility] Cardiac and Pulmonary Rehab  Date 02/02/22       Visit Diagnosis: Heart failure, chronic systolic ([place])  Patient's Home Medications on Admission:  Current Outpatient Medications:    aspirin EC 81 MG tablet, Take 162 mg by mouth daily. Swallow whole., Disp: , Rfl:    atorvastatin (LIPITOR) 80 MG tablet, Take 80 mg by mouth at bedtime., Disp: , Rfl:    Continuous Blood Gluc Receiver ([place]) DEVI, Use to monitor blood sugar. [place] [phone], Disp: , Rfl:    Continuous Blood Gluc Sensor (DEXCOM G6 SENSOR) MISC, Use to monitor blood sugar.  Replace every 10 days. [place] [id number]., Disp: , Rfl:    Continuous Blood Gluc Transmit (DEXCOM G6 TRANSMITTER) MISC, Use to monitor blood sugar.  Replace every 3 months. [place] [id number]., Disp: , Rfl:    ferrous sulfate (FEROSUL) 325 (65 FE) MG tablet, Take 325 mg by mouth 2 (two) times daily with a meal., Disp: , Rfl:    insulin aspart (NOVOLOG) 100 UNIT/ML injection, Inject into the skin 3 (three) times daily before meals., Disp: , Rfl:    insulin glargine (LANTUS) 100 UNIT/ML injection, Inject 34 Units into the skin at bedtime., Disp: , Rfl:    Insulin Pen Needle (COMFORT EZ PEN NEEDLES) 32G X 8 MM MISC, See admin instructions., Disp: , Rfl:    NOVOLOG FLEXPEN 100 UNIT/ML FlexPen, INJ UP TO 50 UNI D [place] IN MULTIPLE INJECTIONS B MEALS UTD, Disp: , Rfl:    polyethylene glycol (MIRALAX / GLYCOLAX) 17 g packet, Take 17 g by mouth daily. Skip the dose if no constipation (Patient taking differently: Take 17 g by mouth daily as needed. Skip the dose if no constipation), Disp: 30 packet, Rfl: 0    spironolactone (ALDACTONE) 25 MG tablet, Take 50 mg by mouth every morning., Disp: , Rfl:    tamsulosin (FLOMAX) 0.4 MG CAPS capsule, Take 1 capsule (0.4 mg total) by mouth daily after supper. (Patient taking differently: Take 0.4 mg by mouth daily after breakfast.), Disp: 30 capsule, Rfl: 11   torsemide (DEMADEX) 100 MG tablet, Take 100 mg by mouth every morning., Disp: , Rfl:    Vitamin D, Ergocalciferol, (DRISDOL) 1.25 MG (50000 UNIT) CAPS capsule, Take 1 capsule (50,000 Units total) by mouth every 7 (seven) days. (Patient taking differently: Take 50,000 Units by mouth every 7 (seven) days. Fridays), Disp: 12 capsule, Rfl: 0  Past Medical History: Past Medical History:  Diagnosis Date   Aortic atherosclerosis ([place])    Arthritis    Bilateral carotid artery stenosis    a.) carotid doppler 05/07/2022: 1-39% BICA   CHF (congestive heart failure), NYHA class II, acute on chronic, systolic (HCC)    a.) TTE 01/09/2015: EF 35%, LVH, inf HK, LAE, triv TR/PR, mild MR, G1DD; b.) TTE 04/09/2017: EF 45%, mod LVH, post HK, triv MR/TR; c.) TTE 07/26/2018: EF 40%, LVH, inf/post HK, LAE, RVE, triv-mild panval regurg, G1DD; d.) TTE 06/01/2019: EF 40%, LVH, api/inf HK, BAE, triv MR/TR/PR, G1DD; e.) TTE 05/20/2021: EF 45%, LVH, LAE, triv MR/TR/PR; f.) TTE 04/12/2022: EF 45-50%, glob HK, BAE, triv MR, G2DD   Chronic kidney disease (CKD), stage II (mild)  Chronic membranous glomerulonephritis    Coronary artery disease    a.) LHC 05/02/1999: 25/25/75% mLAD --> 2.5 x 18 mm Guidant/ACE OTW Tetra   DDD (degenerative disc disease), lumbar    Diabetic neuropathy (HCC)    Dysplastic nevus 08/26/2020   L flank, moderate atypia   Dyspnea on exertion    Elevated troponin    History of bilateral cataract extraction    Hyperlipidemia    Hypertension    Iron deficiency anemia    Ischemic cardiomyopathy    Lymphedema    Melanoma ([place]) 08/20/2015   Right neck. Superficial spreading, arising in nevus. Tumor thickness  1.62m, Anatomic level IV   Melanoma in situ ([place] 05/22/2020   R lateral neck ant to scar, exc 05/22/20   Osteomyelitis of great toe of left foot ([place]    Pulmonary mass 04/23/2022   a.)  CT chest 04/23/2022: LLL mass suspicious for bronchogenic neoplasm with associated peripheral changes; b.)  PET CT [id number] Hypermetabolic LEFT lower lobe mass measuring 8.5 x 4.6 cm (SUV max 23) disease; second foci of metabolic activity slightly more inferior with similar SUV intensity.   RBBB (right bundle branch block)    Recurrent left pleural effusion    Skin cancer    removed l arm   Sleep apnea    a.) s/p uvulectomy 1990   Spinal stenosis of lumbar region with radiculopathy    Swelling of both lower extremities    T2DM (type 2 diabetes mellitus) ([place]    a.) uses Dexcom G6 CGM   Venous ulcer of ankle (HCC)     Tobacco Use: Social History   Tobacco Use  Smoking Status Never  Smokeless Tobacco Never    Labs: Review Flowsheet       Latest Ref Rng & Units 09/01/2006 03/31/2011 04/13/2012 04/12/2022  Labs for ITP Cardiac and Pulmonary Rehab  Hemoglobin A1c 4.8 - 5.6 % 9.6 (NOTE)   The ADA recommends the following therapeutic goals for glycemic   control related to Hgb A1C measurement:   Goal of Therapy:   < 7.0% Hgb A1C   Action Suggested:  > 8.0% Hgb A1C   Ref:  Diabetes Care, 22, Suppl. 1, 1999  11.2  8.1  5.9      Pulmonary Assessment Scores:  Pulmonary Assessment Scores     Row Name 02/02/22 1722         ADL UCSD   ADL Phase Entry     SOB Score total 58     Rest 0     Walk 2     Stairs 5     Bath 1     Dress 2     Shop 1       CAT Score   CAT Score 25       mMRC Score   mMRC Score 4              UCSD: Self-administered rating of dyspnea associated with activities of daily living (ADLs) 6-point scale (0 = "not at all" to 5 = "maximal or unable to do because of breathlessness")  Scoring Scores range from 0 to 120.  Minimally important difference is 5  units  CAT: CAT can identify the health impairment of COPD patients and is better correlated with disease progression.  CAT has a scoring range of zero to 40. The CAT score is classified into four groups of low (less than 10), medium (10 - 20), high (21-30) and very high (31-40) based  on the impact level of disease on health status. A CAT score over 10 suggests significant symptoms.  A worsening CAT score could be explained by an exacerbation, poor medication adherence, poor inhaler technique, or progression of COPD or comorbid conditions.  CAT MCID is 2 points  mMRC: mMRC (Modified Medical Research Council) Dyspnea Scale is used to assess the degree of baseline functional disability in patients of respiratory disease due to dyspnea. No minimal important difference is established. A decrease in score of 1 point or greater is considered a positive change.   Pulmonary Function Assessment:  Pulmonary Function Assessment - 01/26/22 1111       Breath   Shortness of Breath Yes;Limiting activity             Exercise Target Goals: Exercise Program Goal: Individual exercise prescription set using results from initial 6 min walk test and THRR while considering  patient's activity barriers and safety.   Exercise Prescription Goal: Initial exercise prescription builds to 30-45 minutes a day of aerobic activity, 2-3 days per week.  Home exercise guidelines will be given to patient during program as part of exercise prescription that the participant will acknowledge.  Education: Aerobic Exercise: - Group verbal and visual presentation on the components of exercise prescription. Introduces F.I.T.T principle from ACSM for exercise prescriptions.  Reviews F.I.T.T. principles of aerobic exercise including progression. Written material given at graduation.   Education: Resistance Exercise: - Group verbal and visual presentation on the components of exercise prescription. Introduces F.I.T.T principle  from ACSM for exercise prescriptions  Reviews F.I.T.T. principles of resistance exercise including progression. Written material given at graduation.    Education: Exercise & Equipment Safety: - Individual verbal instruction and demonstration of equipment use and safety with use of the equipment. Flowsheet Row Pulmonary Rehab from 02/25/2022 in [facility] Cardiac and Pulmonary Rehab  Date 02/02/22  Educator [facility]  Instruction Review Code 1- Verbalizes Understanding       Education: Exercise Physiology & General Exercise Guidelines: - Group verbal and written instruction with models to review the exercise physiology of the cardiovascular system and associated critical values. Provides general exercise guidelines with specific guidelines to those with heart or lung disease.    Education: Flexibility, Balance, Mind/Body Relaxation: - Group verbal and visual presentation with interactive activity on the components of exercise prescription. Introduces F.I.T.T principle from ACSM for exercise prescriptions. Reviews F.I.T.T. principles of flexibility and balance exercise training including progression. Also discusses the mind body connection.  Reviews various relaxation techniques to help reduce and manage stress (i.e. Deep breathing, progressive muscle relaxation, and visualization). Balance handout provided to take home. Written material given at graduation.   Activity Barriers & Risk Stratification:   6 Minute Walk:  6 Minute Walk     Row Name 02/02/22 1701         6 Minute Walk   Phase Initial     Distance 480 feet     Walk Time 5.94 minutes     # of Rest Breaks 2     MPH 0.91     METS 0.8     RPE 15     Perceived Dyspnea  3     VO2 Peak 3     Symptoms No     Resting HR 96 bpm     Resting BP 158/90     Resting Oxygen Saturation  94 %     Exercise Oxygen Saturation  during 6 min walk 90 %  Max Ex. HR 120 bpm     Max Ex. BP 160/80     2 Minute Post BP 140/70       Interval HR    1 Minute HR 98     2 Minute HR 110     3 Minute HR 112     4 Minute HR 118     5 Minute HR 119     6 Minute HR 120     2 Minute Post HR 100     Interval Heart Rate? Yes       Interval Oxygen   Interval Oxygen? Yes     Baseline Oxygen Saturation % 94 %     1 Minute Oxygen Saturation % 90 %     1 Minute Liters of Oxygen 0 L     2 Minute Oxygen Saturation % 90 %     2 Minute Liters of Oxygen 0 L     3 Minute Oxygen Saturation % 91 %     3 Minute Liters of Oxygen 0 L     4 Minute Oxygen Saturation % 92 %     4 Minute Liters of Oxygen 0 L     5 Minute Oxygen Saturation % 92 %     5 Minute Liters of Oxygen 0 L     6 Minute Oxygen Saturation % 93 %     6 Minute Liters of Oxygen 0 L     2 Minute Post Oxygen Saturation % 98 %     2 Minute Post Liters of Oxygen 0 L             Oxygen Initial Assessment:  Oxygen Initial Assessment - 02/02/22 1721       Home Oxygen   Home Oxygen Device None    Sleep Oxygen Prescription None    Home Exercise Oxygen Prescription None    Home Resting Oxygen Prescription None      Initial 6 min Walk   Oxygen Used None      Program Oxygen Prescription   Program Oxygen Prescription None      Intervention   Short Term Goals To learn and understand importance of maintaining oxygen saturations>88%;To learn and understand importance of monitoring SPO2 with pulse oximeter and demonstrate accurate use of the pulse oximeter.;To learn and demonstrate proper pursed lip breathing techniques or other breathing techniques. ;To learn and exhibit compliance with exercise, home and travel O2 prescription    Long  Term Goals Verbalizes importance of monitoring SPO2 with pulse oximeter and return demonstration;Exhibits proper breathing techniques, such as pursed lip breathing or other method taught during program session;Maintenance of O2 saturations>88%;Exhibits compliance with exercise, home  and travel O2 prescription             Oxygen Re-Evaluation:   Oxygen Re-Evaluation     Row Name 02/16/22 1023 02/27/22 0929           Program Oxygen Prescription   Program Oxygen Prescription -- None        Home Oxygen   Home Oxygen Device -- None      Sleep Oxygen Prescription -- None      Home Exercise Oxygen Prescription -- None      Home Resting Oxygen Prescription -- None        Goals/Expected Outcomes   Short Term Goals -- To learn and understand importance of monitoring SPO2 with pulse oximeter and demonstrate accurate use of the pulse oximeter.;To learn and understand  importance of maintaining oxygen saturations>88%      Long  Term Goals -- Maintenance of O2 saturations>88%;Verbalizes importance of monitoring SPO2 with pulse oximeter and return demonstration      Comments Reviewed PLB technique with pt.  Talked about how it works and it's importance in maintaining their exercise saturations. He has a pulse oximeter to check his oxygen saturation at home. Informed and explained why it is important to have one. Reviewed that oxygen saturations should be 88 percent and above. Patient verbalizes understanding.      Goals/Expected Outcomes Short: Become more profiecient at using PLB. Long: Become independent at using PLB. Short: monitor oxygen at home with exertion. Long: maintain oxygen saturations above 88 percent independently.               Oxygen Discharge (Final Oxygen Re-Evaluation):  Oxygen Re-Evaluation - 02/27/22 0929       Program Oxygen Prescription   Program Oxygen Prescription None      Home Oxygen   Home Oxygen Device None    Sleep Oxygen Prescription None    Home Exercise Oxygen Prescription None    Home Resting Oxygen Prescription None      Goals/Expected Outcomes   Short Term Goals To learn and understand importance of monitoring SPO2 with pulse oximeter and demonstrate accurate use of the pulse oximeter.;To learn and understand importance of maintaining oxygen saturations>88%    Long  Term Goals Maintenance of O2  saturations>88%;Verbalizes importance of monitoring SPO2 with pulse oximeter and return demonstration    Comments He has a pulse oximeter to check his oxygen saturation at home. Informed and explained why it is important to have one. Reviewed that oxygen saturations should be 88 percent and above. Patient verbalizes understanding.    Goals/Expected Outcomes Short: monitor oxygen at home with exertion. Long: maintain oxygen saturations above 88 percent independently.             Initial Exercise Prescription:  Initial Exercise Prescription - 02/02/22 1700       Date of Initial Exercise RX and Referring Provider   Date 02/02/22    Referring Provider [name]      Oxygen   Maintain Oxygen Saturation 88% or higher      Treadmill   MPH 0.8    Grade 0    Minutes 15    METs 1.6      NuStep   Level 1    SPM 80    Minutes 15    METs 1      REL-XR   Level 1    Speed 50    Minutes 15    METs 1      T5 Nustep   Level 1    SPM 80    Minutes 15    METs 1      Biostep-RELP   Level 1    SPM 50    Minutes 15    METs 1      Prescription Details   Frequency (times per week) 3    Duration Progress to 30 minutes of continuous aerobic without signs/symptoms of physical distress      Intensity   THRR 40-80% of Max Heartrate 116-136    Ratings of Perceived Exertion 11-13    Perceived Dyspnea 0-4      Progression   Progression Continue to progress workloads to maintain intensity without signs/symptoms of physical distress.      Resistance Training   Training Prescription Yes  Weight 2    Reps 10-15             Perform Capillary Blood Glucose checks as needed.  Exercise Prescription Changes:   Exercise Prescription Changes     Row Name 02/02/22 1700 03/02/22 1400           Response to Exercise   Blood Pressure (Admit) 158/90 142/72      Blood Pressure (Exercise) 160/80 128/60      Blood Pressure (Exit) 140/70 128/70      Heart Rate (Admit) 96 bpm 95  bpm      Heart Rate (Exercise) 120 bpm 115 bpm      Heart Rate (Exit) 100 bpm 92 bpm      Oxygen Saturation (Admit) 94 % 98 %      Oxygen Saturation (Exercise) 90 % 90 %      Oxygen Saturation (Exit) 98 % 94 %      Rating of Perceived Exertion (Exercise) 15 13      Perceived Dyspnea (Exercise) 3 3      Symptoms none SOB      Comments 6 MWT results 3rd full day of exercise      Duration -- Progress to 30 minutes of  aerobic without signs/symptoms of physical distress      Intensity -- THRR unchanged        Progression   Progression -- Continue to progress workloads to maintain intensity without signs/symptoms of physical distress.      Average METs -- 2.18        Resistance Training   Training Prescription -- Yes      Weight -- 2 lb      Reps -- 10-15        Interval Training   Interval Training -- No        Recumbant Bike   Level -- 1      Minutes -- 15      METs -- 1.9        NuStep   Level -- 3      Minutes -- 30      METs -- 2.3        T5 Nustep   Level -- 3      Minutes -- 30      METs -- 1.9        Oxygen   Maintain Oxygen Saturation -- 88% or higher               Exercise Comments:   Exercise Comments     Row Name 02/16/22 1022           Exercise Comments First full day of exercise!  Patient was oriented to gym and equipment including functions, settings, policies, and procedures.  Patient's individual exercise prescription and treatment plan were reviewed.  All starting workloads were established based on the results of the 6 minute walk test done at initial orientation visit.  The plan for exercise progression was also introduced and progression will be customized based on patient's performance and goals.                Exercise Goals and Review:   Exercise Goals     Row Name 02/02/22 1713             Exercise Goals   Increase Physical Activity Yes       Intervention Provide advice, education, support and counseling about physical  activity/exercise needs.;Develop an individualized exercise prescription for  aerobic and resistive training based on initial evaluation findings, risk stratification, comorbidities and participant's personal goals.       Expected Outcomes Short Term: Attend rehab on a regular basis to increase amount of physical activity.;Long Term: Add in home exercise to make exercise part of routine and to increase amount of physical activity.;Long Term: Exercising regularly at least 3-5 days a week.       Increase Strength and Stamina Yes       Intervention Provide advice, education, support and counseling about physical activity/exercise needs.;Develop an individualized exercise prescription for aerobic and resistive training based on initial evaluation findings, risk stratification, comorbidities and participant's personal goals.       Expected Outcomes Short Term: Increase workloads from initial exercise prescription for resistance, speed, and METs.;Short Term: Perform resistance training exercises routinely during rehab and add in resistance training at home;Long Term: Improve cardiorespiratory fitness, muscular endurance and strength as measured by increased METs and functional capacity (6MWT)       Able to understand and use rate of perceived exertion (RPE) scale Yes       Intervention Provide education and explanation on how to use RPE scale       Expected Outcomes Short Term: Able to use RPE daily in rehab to express subjective intensity level;Long Term:  Able to use RPE to guide intensity level when exercising independently       Able to understand and use Dyspnea scale Yes       Intervention Provide education and explanation on how to use Dyspnea scale       Expected Outcomes Short Term: Able to use Dyspnea scale daily in rehab to express subjective sense of shortness of breath during exertion;Long Term: Able to use Dyspnea scale to guide intensity level when exercising independently       Knowledge and  understanding of Target Heart Rate Range (THRR) Yes       Intervention Provide education and explanation of THRR including how the numbers were predicted and where they are located for reference       Expected Outcomes Short Term: Able to state/look up THRR;Long Term: Able to use THRR to govern intensity when exercising independently;Short Term: Able to use daily as guideline for intensity in rehab       Able to check pulse independently Yes       Intervention Provide education and demonstration on how to check pulse in carotid and radial arteries.;Review the importance of being able to check your own pulse for safety during independent exercise       Expected Outcomes Short Term: Able to explain why pulse checking is important during independent exercise;Long Term: Able to check pulse independently and accurately       Understanding of Exercise Prescription Yes       Intervention Provide education, explanation, and written materials on patient's individual exercise prescription       Expected Outcomes Short Term: Able to explain program exercise prescription;Long Term: Able to explain home exercise prescription to exercise independently                Exercise Goals Re-Evaluation :  Exercise Goals Re-Evaluation     Row Name 02/16/22 1022 03/02/22 1442 03/17/22 1522 03/30/22 1206 04/14/22 1416     Exercise Goal Re-Evaluation   Exercise Goals Review Able to understand and use rate of perceived exertion (RPE) scale;Able to understand and use Dyspnea scale;Knowledge and understanding of Target Heart Rate Range (THRR);Understanding of Exercise Prescription  Increase Physical Activity;Increase Strength and Stamina;Understanding of Exercise Prescription Increase Physical Activity;Increase Strength and Stamina;Understanding of Exercise Prescription Increase Physical Activity;Increase Strength and Stamina;Understanding of Exercise Prescription --   Comments Reviewed RPE scale, THR and program  prescription with pt today.  Pt voiced understanding and was given a copy of goals to take home. [name] is doing well for the first couple of sessions he has been here. He has not walked yet due to MSK limitations. He has been working at level 3 on both the T5 and T4 machines. Oxygen saturations are staying well above 88%. We will continue to monitor as he progresses in the program. We hope to see some walking into his regimen if he is able to at some point. [name] has not attended rehab since the last review. We will reach out to him and explain the importance of good attendance in the program. Once he returns we will continue to monitor his progress. [name] has not been to rehab since last review. He had a thoracentesis completed on 1/9 and we are awaiting for clearance to return to rehab. Will continue to receive updates with patient. [name] has not been to rehab since last review. He had a thoracentesis completed on 1/9 and we are awaiting for clearance to return to rehab. He has been in the hospital with the flu since he saw the doctor as well. Will continue to receive updates with patient.   Expected Outcomes Short: Use RPE daily to regulate intensity. Long: Follow program prescription in THR. Short: Continue exercise prescription, start with slow walking if able Long: Increase overall MET level Short: Return to regular attendance in rehab. Long: Continue to improve strength and stamina. Short: Get clearance to return to rehab Long: Graduate from [employer] Short: Get clearance to return to rehab Long: Graduate from [employer] Name 04/28/22 1443 04/28/22 1449 05/11/22 1437         Exercise Goal Re-Evaluation   Exercise Goals Review Increase Physical Activity;Increase Strength and Stamina;Understanding of Exercise Prescription -- Increase Physical Activity;Increase Strength and Stamina;Understanding of Exercise Prescription     Comments [name] continues to be out of rehab. He has a  procedure scheduled 3/1 and we will wait for patient to complete it and receive clearance before returning back. Will continue to receive updates from patient. [name] is completing home health PT and was advised he needs to finish that first before resuming back there at Pulmonary Rehab. Both him and son are aware. [name] is completing home health PT and was advised he needs to finish that first before resuming back there at Pulmonary Rehab. Both him and son are aware.     Expected Outcomes Short: Return when cleared to do so Long: Graduate from [place]: Return when cleared to do so Long: Graduate from [employer]              Discharge Exercise Prescription (Final Exercise Prescription Changes):  Exercise Prescription Changes - 03/02/22 1400       Response to Exercise   Blood Pressure (Admit) 142/72    Blood Pressure (Exercise) 128/60    Blood Pressure (Exit) 128/70    Heart Rate (Admit) 95 bpm    Heart Rate (Exercise) 115 bpm    Heart Rate (Exit) 92 bpm    Oxygen Saturation (Admit) 98 %    Oxygen Saturation (Exercise) 90 %    Oxygen Saturation (Exit) 94 %    Rating of Perceived Exertion (Exercise) 13  Perceived Dyspnea (Exercise) 3    Symptoms SOB    Comments 3rd full day of exercise    Duration Progress to 30 minutes of  aerobic without signs/symptoms of physical distress    Intensity THRR unchanged      Progression   Progression Continue to progress workloads to maintain intensity without signs/symptoms of physical distress.    Average METs 2.18      Resistance Training   Training Prescription Yes    Weight 2 lb    Reps 10-15      Interval Training   Interval Training No      Recumbant Bike   Level 1    Minutes 15    METs 1.9      NuStep   Level 3    Minutes 30    METs 2.3      T5 Nustep   Level 3    Minutes 30    METs 1.9      Oxygen   Maintain Oxygen Saturation 88% or higher             Nutrition:  Target Goals: Understanding of  nutrition guidelines, daily intake of sodium '1500mg'$ , cholesterol '200mg'$ , calories 30% from fat and 7% or less from saturated fats, daily to have 5 or more servings of fruits and vegetables.  Education: All About Nutrition: -Group instruction provided by verbal, written material, interactive activities, discussions, models, and posters to present general guidelines for heart healthy nutrition including fat, fiber, MyPlate, the role of sodium in heart healthy nutrition, utilization of the nutrition label, and utilization of this knowledge for meal planning. Follow up email sent as well. Written material given at graduation. Flowsheet Row Pulmonary Rehab from 02/25/2022 in [facility] Cardiac and Pulmonary Rehab  Education need identified 02/02/22       Biometrics:  Pre Biometrics - 02/02/22 1714       Pre Biometrics   Height 5' 10.75" (1.797 m)    Weight 307 lb 11.2 oz (139.6 kg)    Waist Circumference 56 inches    Hip Circumference 54 inches    Waist to Hip Ratio 1.04 %    BMI (Calculated) 43.22    Single Leg Stand 0 seconds              Nutrition Therapy Plan and Nutrition Goals:  Nutrition Therapy & Goals - 02/02/22 1607       Nutrition Therapy   Diet Heart healthy, low Na, T2DM    Drug/Food Interactions Statins/Certain Fruits    Protein (specify units) 80-85g    Fiber 30 grams    Whole Grain Foods 3 servings    Saturated Fats 16 max. grams    Fruits and Vegetables 8 servings/day    Sodium 2 grams      Personal Nutrition Goals   Nutrition Goal ST: practice building balanced meals with fiber, healthy fat, and protein. Continue to limit sodium. LT: limit Na <2g/day, follow MyPlate guidelines, manage A1C <7%    Comments 72 y.o. M admitted to pulmonary rehab for heart failure. PMHx includes T2DM, HTN, CAD, HLD, CKD stg 2. Relevant medications includes lipitor, ferrous sulfate, vitamin C, B-12, furosemide, novolog, lantus. He has a continuous meter and uses his short acting  insulin using his current BG. [name] reports living with his son and his granddaughter. he is now drinking mostly water and limiting sodium. B: coffee with doughnut or muffin or poptart and some water L: leftovers, sandwich (pimento cheese or peanut butter -  used to have deli meat) D: fruit - apples or sandwich (pimento cheese or peanut butter - used to have deli meat), non-starchy vegetables ("anything and everything" - he enjoys going to the farmers market), potatoes, steak, chicken, fish (cod). He cooks meat with olive oil on pan - hes getting used ot an air fryer. S: does not snack late at night anymore. Sometimes he rpeorts just hvaing a baked potato to eat. Discussed MyPlate and balancing meals as well as heart healthy eating and T2DM MNT.      Intervention Plan   Intervention Prescribe, educate and counsel regarding individualized specific dietary modifications aiming towards targeted core components such as weight, hypertension, lipid management, diabetes, heart failure and other comorbidities.;Nutrition handout(s) given to patient.    Expected Outcomes Short Term Goal: Understand basic principles of dietary content, such as calories, fat, sodium, cholesterol and nutrients.;Short Term Goal: A plan has been developed with personal nutrition goals set during dietitian appointment.;Long Term Goal: Adherence to prescribed nutrition plan.             Nutrition Assessments:  MEDIFICTS Score Key: ?70 Need to make dietary changes  40-70 Heart Healthy Diet ? 40 Therapeutic Level Cholesterol Diet   Picture Your Plate Scores: [id number] Unhealthy dietary pattern with much room for improvement. 41-50 Dietary pattern unlikely to meet recommendations for good health and room for improvement. 51-60 More healthful dietary pattern, with some room for improvement.  >60 Healthy dietary pattern, although there may be some specific behaviors that could be improved.   Nutrition Goals Re-Evaluation:  Nutrition  Goals Re-Evaluation     [place] Name 02/27/22 0935             Goals   Current Weight 306 lb (138.8 kg)       Nutrition Goal Eat a diet that helps his sugar.       Comment [name] is taking his medication properly to help his diabetes. He is taking his shots three times a day before each meal. His A1c is around 6.7. He wants to continue to work on his blood sugar levels.       Expected Outcome Short: eat foods that help with his diabetes. Long: reduce A1c.                Nutrition Goals Discharge (Final Nutrition Goals Re-Evaluation):  Nutrition Goals Re-Evaluation - 02/27/22 0935       Goals   Current Weight 306 lb (138.8 kg)    Nutrition Goal Eat a diet that helps his sugar.    Comment [name] is taking his medication properly to help his diabetes. He is taking his shots three times a day before each meal. His A1c is around 6.7. He wants to continue to work on his blood sugar levels.    Expected Outcome Short: eat foods that help with his diabetes. Long: reduce A1c.             Psychosocial: Target Goals: Acknowledge presence or absence of significant depression and/or stress, maximize coping skills, provide positive support system. Participant is able to verbalize types and ability to use techniques and skills needed for reducing stress and depression.   Education: Stress, Anxiety, and Depression - Group verbal and visual presentation to define topics covered.  Reviews how body is impacted by stress, anxiety, and depression.  Also discusses healthy ways to reduce stress and to treat/manage anxiety and depression.  Written material given at graduation. Flowsheet Row Pulmonary Rehab from 02/25/2022 in [facility]  Cardiac and Pulmonary Rehab  Education need identified 02/02/22       Education: Sleep Hygiene -Provides group verbal and written instruction about how sleep can affect your health.  Define sleep hygiene, discuss sleep cycles and impact of sleep habits. Review good sleep  hygiene tips.    Initial Review & Psychosocial Screening:  Initial Psych Review & Screening - 01/26/22 1113       Initial Review   Current issues with None Identified      Family Dynamics   Good Support System? Yes    Comments He can look to his son and grandaughter for support. He is concerned about his health due to his fluid build up and shortness of breath.      Barriers   Psychosocial barriers to participate in program The patient should benefit from training in stress management and relaxation.      Screening Interventions   Interventions Encouraged to exercise;To provide support and resources with identified psychosocial needs;Provide feedback about the scores to participant    Expected Outcomes Long Term Goal: Stressors or current issues are controlled or eliminated.;Short Term goal: Utilizing psychosocial counselor, staff and physician to assist with identification of specific Stressors or current issues interfering with healing process. Setting desired goal for each stressor or current issue identified.;Short Term goal: Identification and review with participant of any Quality of Life or Depression concerns found by scoring the questionnaire.;Long Term goal: The participant improves quality of Life and PHQ9 Scores as seen by post scores and/or verbalization of changes             Quality of Life Scores:  Quality of Life - 02/02/22 1716       Quality of Life   Select Quality of Life      Quality of Life Scores   Health/Function Pre 13.38 %    Socioeconomic Pre 25.5 %    Psych/Spiritual Pre 29.14 %    Family Pre 30 %    GLOBAL Pre 21.43 %            Scores of 19 and below usually indicate a poorer quality of life in these areas.  A difference of  2-3 points is a clinically meaningful difference.  A difference of 2-3 points in the total score of the Quality of Life Index has been associated with significant improvement in overall quality of life, self-image,  physical symptoms, and general health in studies assessing change in quality of life.  PHQ-9: Review Flowsheet       02/27/2022 02/02/2022 10/16/2021  Depression screen PHQ 2/9  Decreased Interest 0 0 0  Down, Depressed, Hopeless 0 0 0  PHQ - 2 Score 0 0 0  Altered sleeping 0 0 -  Tired, decreased energy 2 3 -  Change in appetite 2 2 -  Feeling bad or failure about yourself  0 0 -  Trouble concentrating 0 0 -  Moving slowly or fidgety/restless 0 1 -  Suicidal thoughts 0 0 -  PHQ-9 Score 4 6 -  Difficult doing work/chores Not difficult at all Somewhat difficult -   Interpretation of Total Score  Total Score Depression Severity:  1-4 = Minimal depression, 5-9 = Mild depression, 10-14 = Moderate depression, 15-19 = Moderately severe depression, 20-27 = Severe depression   Psychosocial Evaluation and Intervention:  Psychosocial Evaluation - 01/26/22 1115       Psychosocial Evaluation & Interventions   Interventions Stress management education;Relaxation education;Encouraged to exercise with the program and  follow exercise prescription    Comments He can look to his son and grandaughter for support. He is concerned about his health due to his fluid build up and shortness of breath.    Expected Outcomes Short: Start [facility] to help with mood. Long: Maintain a healthy mental state.    Continue Psychosocial Services  Follow up required by staff             Psychosocial Re-Evaluation:  Psychosocial Re-Evaluation     [place] Name 02/27/22 [phone]             Psychosocial Re-Evaluation   Current issues with None Identified       Comments Reviewed patient health questionnaire (PHQ-9) with patient for follow up. Previously, patients score indicated signs/symptoms of depression.  Reviewed to see if patient is improving symptom wise while in program.  Score improved and patient states that it is because he is able to exercise more.       Expected Outcomes Short: Continue to attend  [facility] regularly for regular exercise and social engagement. Long: Continue to improve symptoms and manage a positive mental state.       Interventions Encouraged to attend Pulmonary Rehabilitation for the exercise       Continue Psychosocial Services  Follow up required by staff                Psychosocial Discharge (Final Psychosocial Re-Evaluation):  Psychosocial Re-Evaluation - 02/27/22 0933       Psychosocial Re-Evaluation   Current issues with None Identified    Comments Reviewed patient health questionnaire (PHQ-9) with patient for follow up. Previously, patients score indicated signs/symptoms of depression.  Reviewed to see if patient is improving symptom wise while in program.  Score improved and patient states that it is because he is able to exercise more.    Expected Outcomes Short: Continue to attend [facility] regularly for regular exercise and social engagement. Long: Continue to improve symptoms and manage a positive mental state.    Interventions Encouraged to attend Pulmonary Rehabilitation for the exercise    Continue Psychosocial Services  Follow up required by staff             Education: Education Goals: Education classes will be provided on a weekly basis, covering required topics. Participant will state understanding/return demonstration of topics presented.  Learning Barriers/Preferences:  Learning Barriers/Preferences - 01/26/22 1112       Learning Barriers/Preferences   Learning Barriers None    Learning Preferences None             General Pulmonary Education Topics:  Infection Prevention: - Provides verbal and written material to individual with discussion of infection control including proper hand washing and proper equipment cleaning during exercise session. Flowsheet Row Pulmonary Rehab from 02/25/2022 in [facility] Cardiac and Pulmonary Rehab  Date 02/02/22  Educator [facility]  Instruction Review Code 1- Verbalizes Understanding        Falls Prevention: - Provides verbal and written material to individual with discussion of falls prevention and safety. Flowsheet Row Pulmonary Rehab from 02/25/2022 in [facility] Cardiac and Pulmonary Rehab  Date 02/02/22  Educator [facility]  Instruction Review Code 1- Verbalizes Understanding       Chronic Lung Disease Review: - Group verbal instruction with posters, models, PowerPoint presentations and videos,  to review new updates, new respiratory medications, new advancements in procedures and treatments. Providing information on websites and "800" numbers for continued self-education. Includes information about supplement oxygen, available portable oxygen  systems, continuous and intermittent flow rates, oxygen safety, concentrators, and Medicare reimbursement for oxygen. Explanation of Pulmonary Drugs, including class, frequency, complications, importance of spacers, rinsing mouth after steroid MDI's, and proper cleaning methods for nebulizers. Review of basic lung anatomy and physiology related to function, structure, and complications of lung disease. Review of risk factors. Discussion about methods for diagnosing sleep apnea and types of masks and machines for OSA. Includes a review of the use of types of environmental controls: home humidity, furnaces, filters, dust mite/pet prevention, HEPA vacuums. Discussion about weather changes, air quality and the benefits of nasal washing. Instruction on Warning signs, infection symptoms, calling MD promptly, preventive modes, and value of vaccinations. Review of effective airway clearance, coughing and/or vibration techniques. Emphasizing that all should Create an Action Plan. Written material given at graduation.   AED/CPR: - Group verbal and written instruction with the use of models to demonstrate the basic use of the AED with the basic ABC's of resuscitation.    Anatomy and Cardiac Procedures: - Group verbal and visual presentation and models  provide information about basic cardiac anatomy and function. Reviews the testing methods done to diagnose heart disease and the outcomes of the test results. Describes the treatment choices: Medical Management, Angioplasty, or Coronary Bypass Surgery for treating various heart conditions including Myocardial Infarction, Angina, Valve Disease, and Cardiac Arrhythmias.  Written material given at graduation. Flowsheet Row Pulmonary Rehab from 02/25/2022 in [facility] Cardiac and Pulmonary Rehab  Education need identified 02/02/22       Medication Safety: - Group verbal and visual instruction to review commonly prescribed medications for heart and lung disease. Reviews the medication, class of the drug, and side effects. Includes the steps to properly store meds and maintain the prescription regimen.  Written material given at graduation. Flowsheet Row Pulmonary Rehab from 02/25/2022 in [facility] Cardiac and Pulmonary Rehab  Date 02/25/22  Educator [facility]  Instruction Review Code 1- Verbalizes Understanding       Other: -Provides group and verbal instruction on various topics (see comments)   Knowledge Questionnaire Score:  Knowledge Questionnaire Score - 02/02/22 1720       Knowledge Questionnaire Score   Pre Score 21/26              Core Components/Risk Factors/Patient Goals at Admission:  Personal Goals and Risk Factors at Admission - 02/02/22 1720       Core Components/Risk Factors/Patient Goals on Admission    Weight Management Yes;Weight Loss;Obesity    Intervention Weight Management: Develop a combined nutrition and exercise program designed to reach desired caloric intake, while maintaining appropriate intake of nutrient and fiber, sodium and fats, and appropriate energy expenditure required for the weight goal.;Weight Management: Provide education and appropriate resources to help participant work on and attain dietary goals.;Weight Management/Obesity: Establish reasonable short term  and long term weight goals.;Obesity: Provide education and appropriate resources to help participant work on and attain dietary goals.    Admit Weight 307 lb 11.2 oz (139.6 kg)    Goal Weight: Short Term 295 lb (133.8 kg)    Goal Weight: Long Term 250 lb (113.4 kg)    Expected Outcomes Short Term: Continue to assess and modify interventions until short term weight is achieved;Long Term: Adherence to nutrition and physical activity/exercise program aimed toward attainment of established weight goal;Weight Maintenance: Understanding of the daily nutrition guidelines, which includes 25-35% calories from fat, 7% or less cal from saturated fats, less than '200mg'$  cholesterol, less than 1.5gm of sodium, &  5 or more servings of fruits and vegetables daily;Weight Loss: Understanding of general recommendations for a balanced deficit meal plan, which promotes 1-2 lb weight loss per week and includes a negative energy balance of [phone] kcal/d;Understanding recommendations for meals to include 15-35% energy as protein, 25-35% energy from fat, 35-60% energy from carbohydrates, less than '200mg'$  of dietary cholesterol, 20-35 gm of total fiber daily;Understanding of distribution of calorie intake throughout the day with the consumption of 4-5 meals/snacks    Improve shortness of breath with ADL's Yes    Intervention Provide education, individualized exercise plan and daily activity instruction to help decrease symptoms of SOB with activities of daily living.    Expected Outcomes Short Term: Improve cardiorespiratory fitness to achieve a reduction of symptoms when performing ADLs;Long Term: Be able to perform more ADLs without symptoms or delay the onset of symptoms    Diabetes Yes    Intervention Provide education about signs/symptoms and action to take for hypo/hyperglycemia.;Provide education about proper nutrition, including hydration, and aerobic/resistive exercise prescription along with prescribed medications to  achieve blood glucose in normal ranges: Fasting glucose 65-99 mg/dL    Expected Outcomes Short Term: Participant verbalizes understanding of the signs/symptoms and immediate care of hyper/hypoglycemia, proper foot care and importance of medication, aerobic/resistive exercise and nutrition plan for blood glucose control.;Long Term: Attainment of HbA1C < 7%.    Heart Failure Yes    Intervention Provide a combined exercise and nutrition program that is supplemented with education, support and counseling about heart failure. Directed toward relieving symptoms such as shortness of breath, decreased exercise tolerance, and extremity edema.    Expected Outcomes Improve functional capacity of life;Short term: Attendance in program 2-3 days a week with increased exercise capacity. Reported lower sodium intake. Reported increased fruit and vegetable intake. Reports medication compliance.;Short term: Daily weights obtained and reported for increase. Utilizing diuretic protocols set by physician.;Long term: Adoption of self-care skills and reduction of barriers for early signs and symptoms recognition and intervention leading to self-care maintenance.    Hypertension Yes    Intervention Provide education on lifestyle modifcations including regular physical activity/exercise, weight management, moderate sodium restriction and increased consumption of fresh fruit, vegetables, and low fat dairy, alcohol moderation, and smoking cessation.;Monitor prescription use compliance.    Expected Outcomes Short Term: Continued assessment and intervention until BP is < 140/26m HG in hypertensive participants. < 130/879mHG in hypertensive participants with diabetes, heart failure or chronic kidney disease.;Long Term: Maintenance of blood pressure at goal levels.    Lipids Yes    Intervention Provide education and support for participant on nutrition & aerobic/resistive exercise along with prescribed medications to achieve LDL '70mg'$ ,  HDL >'40mg'$ .    Expected Outcomes Short Term: Participant states understanding of desired cholesterol values and is compliant with medications prescribed. Participant is following exercise prescription and nutrition guidelines.;Long Term: Cholesterol controlled with medications as prescribed, with individualized exercise RX and with personalized nutrition plan. Value goals: LDL < '70mg'$ , HDL > 40 mg.             Education:Diabetes - Individual verbal and written instruction to review signs/symptoms of diabetes, desired ranges of glucose level fasting, after meals and with exercise. Acknowledge that pre and post exercise glucose checks will be done for 3 sessions at entry of program. [facility]  Date 02/02/22  [facility] An[facility]ruction Review Code 1- Verbalizes Understanding       Know Your  Numbers and Heart Failure: - Group verbal and visual instruction to discuss disease risk factors for cardiac and pulmonary disease and treatment options.  Reviews associated critical values for Overweight/Obesity, Hypertension, Cholesterol, and Diabetes.  Discusses basics of heart failure: signs/symptoms and treatments.  Introduces Heart Failure Zone chart for action plan for heart failure.  Written material given at graduation. Flowsheet Row Pulmonary Rehab from 02/25/2022 in [facility] Cardiac and Pulmonary Rehab  Education need identified 02/02/22       Core Components/Risk Factors/Patient Goals Review:   Goals and Risk Factor Review     Row Name 02/27/22 0928             Core Components/Risk Factors/Patient Goals Review   Personal Goals Review Improve shortness of breath with ADL's       Review Spoke to patient about their shortness of breath and what they can do to improve. Patient has been informed of breathing techniques when starting the program. Patient is informed to tell staff if they have had any med changes and that  certain meds they are taking or not taking can be causing shortness of breath.       Expected Outcomes Short: Attend [facility] regularly to improve shortness of breath with ADL's. Long: maintain independence with ADL's                Core Components/Risk Factors/Patient Goals at Discharge (Final Review):   Goals and Risk Factor Review - 02/27/22 0928       Core Components/Risk Factors/Patient Goals Review   Personal Goals Review Improve shortness of breath with ADL's    Review Spoke to patient about their shortness of breath and what they can do to improve. Patient has been informed of breathing techniques when starting the program. Patient is informed to tell staff if they have had any med changes and that certain meds they are taking or not taking can be causing shortness of breath.    Expected Outcomes Short: Attend [facility] regularly to improve shortness of breath with ADL's. Long: maintain independence with ADL's             ITP Comments:  ITP Comments     Row Name 01/26/22 1110 02/02/22 1701 02/11/22 1051 02/16/22 1022 03/11/22 1119   ITP Comments Virtual Visit completed. Patient informed on EP and RD appointment and 6 Minute walk test. Patient also informed of patient health questionnaires on My Chart. Patient Verbalizes understanding. Visit diagnosis [facility] found in [facility]/23[facility]. Completed 6MWT and gym orientation. Initial ITP created and sent for review to Dr. Zetta Bills, Medical Director. 30 Day review completed. Medical Director ITP review done, changes made as directed, and signed approval by Medical Director.   new to program First full day of exercise!  Patient was oriented to gym and equipment including functions, settings, policies, and procedures.  Patient's individual exercise prescription and treatment plan were reviewed.  All starting workloads were established based on the results of the 6 minute walk test done at initial orientation visit.  The plan for  exercise progression was also introduced and progression will be customized based on patient's performance and goals. 30 Day review completed. Medical Director ITP review done, changes made as directed, and signed approval by Medical Director.    new to program    Row Name 03/30/22 1208 04/06/22 1410 04/08/22 1052 04/28/22 1449 05/06/22 1508   ITP Comments Patient has been out. has not attended since 12/16. He had a thoracentesis completed on 03/24/22  and awaiting clearance to return. Spoke with patient who states he is feeling much better. Per Dr. Lanney Gins, would like to see patient in follow up first before returning back with rehab. Appt is on 1/26 and first appt back is 1/29 unless patient will call us if anything changes or if MD says otherwise. Patient in agreement. 30 Day review completed. Medical Director ITP review done, changes made as directed, and signed approval by Medical Director.   remains out for medical reason [name] was back in the hospital and discharged on 2/11. Spoke with patient who states he feels much better but is now enrolled in home health PT. Spoke with him and his son to advise that patient needs to finish home health PT first before resuming back with [place]. Receive clearance to return. Patient or son will call us to keep [place] updated on how long home health PT will be. 30 day review completed. ITP sent to Dr. Zetta Bills, Medical Director of  Pulmonary Rehab. Continue with ITP unless changes are made by physician. Pt continues to be out on medical hold for HHPT.    [place] Name 05/20/22 1431 05/25/22 1107         ITP Comments Spoke with patient, states he was just discharged from home PT as of yesterday and would like to resume back  pulmonary rehab sessions. Would like to switch class times- will call back next week once a spot opens. Reached out to Dr. Lanney Gins for clearance for patient to return. Per Dr. Lanney Gins,  "let's hold off on pulmonary rehab for now" given a new  diagnosis. Called and spoke with patient that we will discharge him at this time per MD orders, and encouraged him to follow up with his referring doctor and Dr. Loni Muse on when he can be cleared to come back- would need a new referral.               Comments: Discharge ITP

## 2022-05-27 DIAGNOSIS — N189 Chronic kidney disease, unspecified: Secondary | ICD-10-CM | POA: Diagnosis not present

## 2022-05-27 DIAGNOSIS — R809 Proteinuria, unspecified: Secondary | ICD-10-CM | POA: Diagnosis not present

## 2022-05-27 DIAGNOSIS — N182 Chronic kidney disease, stage 2 (mild): Secondary | ICD-10-CM | POA: Diagnosis not present

## 2022-05-27 DIAGNOSIS — N079 Hereditary nephropathy, not elsewhere classified with unspecified morphologic lesions: Secondary | ICD-10-CM | POA: Diagnosis not present

## 2022-05-27 DIAGNOSIS — R3129 Other microscopic hematuria: Secondary | ICD-10-CM | POA: Diagnosis not present

## 2022-05-27 DIAGNOSIS — R8290 Unspecified abnormal findings in urine: Secondary | ICD-10-CM | POA: Diagnosis not present

## 2022-05-27 DIAGNOSIS — E1122 Type 2 diabetes mellitus with diabetic chronic kidney disease: Secondary | ICD-10-CM | POA: Diagnosis not present

## 2022-05-27 DIAGNOSIS — I10 Essential (primary) hypertension: Secondary | ICD-10-CM | POA: Diagnosis not present

## 2022-05-27 DIAGNOSIS — R600 Localized edema: Secondary | ICD-10-CM | POA: Diagnosis not present

## 2022-05-28 ENCOUNTER — Other Ambulatory Visit: Payer: Medicare HMO

## 2022-05-29 ENCOUNTER — Encounter: Payer: Self-pay | Admitting: Oncology

## 2022-06-01 ENCOUNTER — Encounter: Payer: Self-pay | Admitting: Oncology

## 2022-06-01 ENCOUNTER — Inpatient Hospital Stay: Payer: Medicare HMO | Attending: Oncology | Admitting: Oncology

## 2022-06-01 VITALS — BP 111/55 | HR 80 | Temp 97.6°F | Resp 17 | Wt 270.0 lb

## 2022-06-01 DIAGNOSIS — C434 Malignant melanoma of scalp and neck: Secondary | ICD-10-CM | POA: Diagnosis not present

## 2022-06-01 DIAGNOSIS — N0421 Primary membranous nephropathy with nephrotic syndrome: Secondary | ICD-10-CM | POA: Diagnosis not present

## 2022-06-01 DIAGNOSIS — R600 Localized edema: Secondary | ICD-10-CM | POA: Diagnosis not present

## 2022-06-01 DIAGNOSIS — E1122 Type 2 diabetes mellitus with diabetic chronic kidney disease: Secondary | ICD-10-CM | POA: Diagnosis not present

## 2022-06-01 DIAGNOSIS — Z5112 Encounter for antineoplastic immunotherapy: Secondary | ICD-10-CM | POA: Insufficient documentation

## 2022-06-01 DIAGNOSIS — B351 Tinea unguium: Secondary | ICD-10-CM | POA: Diagnosis not present

## 2022-06-01 DIAGNOSIS — D509 Iron deficiency anemia, unspecified: Secondary | ICD-10-CM | POA: Insufficient documentation

## 2022-06-01 DIAGNOSIS — Z79620 Long term (current) use of immunosuppressive biologic: Secondary | ICD-10-CM | POA: Insufficient documentation

## 2022-06-01 DIAGNOSIS — E1142 Type 2 diabetes mellitus with diabetic polyneuropathy: Secondary | ICD-10-CM | POA: Diagnosis not present

## 2022-06-01 DIAGNOSIS — N2581 Secondary hyperparathyroidism of renal origin: Secondary | ICD-10-CM | POA: Diagnosis not present

## 2022-06-01 DIAGNOSIS — N189 Chronic kidney disease, unspecified: Secondary | ICD-10-CM | POA: Diagnosis not present

## 2022-06-01 DIAGNOSIS — C7800 Secondary malignant neoplasm of unspecified lung: Secondary | ICD-10-CM | POA: Diagnosis not present

## 2022-06-01 DIAGNOSIS — R809 Proteinuria, unspecified: Secondary | ICD-10-CM | POA: Diagnosis not present

## 2022-06-01 DIAGNOSIS — I10 Essential (primary) hypertension: Secondary | ICD-10-CM | POA: Diagnosis not present

## 2022-06-01 MED ORDER — PROCHLORPERAZINE MALEATE 10 MG PO TABS: 10.0000 mg | ORAL_TABLET | Freq: Four times a day (QID) | ORAL | 1 refills | Status: DC | PRN

## 2022-06-01 NOTE — Progress Notes (Signed)
START ON PATHWAY REGIMEN - Melanoma and Other Skin Cancers     Cycles 1 through 4: A cycle is every 21 days:     Nivolumab      Ipilimumab    Cycles 5 and beyond: A cycle is every 28 days:     Nivolumab   **Always confirm dose/schedule in your pharmacy ordering system**  Patient Characteristics: Melanoma, Cutaneous/Unknown Primary, Distant Metastases, Unresectable, No Brain Metastases, First Line, BRAF V600 Wild Type / BRAF V600 Results Pending or Unknown, Candidate for Immunotherapy Disease Classification: Melanoma Disease Subtype: Cutaneous BRAF V600 Mutation Status: Awaiting BRAF V600 Results Therapeutic Status: Distant Metastases Line of Therapy: First Line Immunotherapy Candidate Status: Candidate for Immunotherapy Intent of Therapy: Non-Curative / Palliative Intent, Discussed with Patient 

## 2022-06-01 NOTE — Research (Signed)
Trial:  S2013 - Immune Checkpoint Inhibitor Toxicity (I-CHECKIT): A Prospective Observational Study  Patient [name] was identified by this nurse as a potential candidate for the above listed study.  This Clinical Research Nurse met with [name], [id number], on 06/01/22 in a manner and location that ensures patient privacy to discuss participation in the above listed research study.  Patient is Accompanied by [name] .  A copy of the informed consent document and separate HIPAA Authorization was provided to the patient.  Patient reads, speaks, and understands [place].   Patient was provided with the business card of this Nurse and encouraged to contact the research team with any questions.  Approximately 15 minutes were spent with the patient reviewing the informed consent documents.  Patient was provided the option of taking informed consent documents home to review and was encouraged to review at their convenience with their support network, including other care providers. Patient is comfortable with making a decision regarding study participation today.  Patient states he wants to participate in the study. He is coming in for his chemotherapy education this week. Research nurse will meet with the patient after his class for ICF review and signature.  Jeral Fruit, RN 06/01/22 12:13 PM

## 2022-06-01 NOTE — Progress Notes (Signed)
[place]  [phone] [phone]  ID: [name] OB: 1950/11/11  MR#: [id number]  CSN#:[id number]  Patient Care Team: Juluis Pitch, MD as PCP - General (Family Medicine) Efrain Sella, MD as Consulting Physician (Gastroenterology) Lloyd Huger, MD as Consulting Physician (Oncology) Valente David, RN as [facility]  CHIEF COMPLAINT: Stage IV melanoma with lung metastasis.  INTERVAL HISTORY: Patient returns to clinic today for further evaluation, discussion of his pathology and imaging results, and treatment planning.  He has chronic weakness and fatigue as well as chronic shortness of breath, but otherwise feels well.  He has no neurologic complaints.  He denies any fevers.  He has a good appetite and denies weight loss.  He has no chest pain, cough, or hemoptysis.  He denies any nausea, vomiting, constipation, or diarrhea.  He has no melena or hematochezia.  He has no urinary complaints.  Patient offers no further specific complaints today.  REVIEW OF SYSTEMS:   Review of Systems  Constitutional:  Positive for malaise/fatigue. Negative for fever and weight loss.  Respiratory:  Positive for shortness of breath. Negative for cough and hemoptysis.   Cardiovascular: Negative.  Negative for chest pain and leg swelling.  Gastrointestinal: Negative.  Negative for abdominal pain, blood in stool and melena.  Genitourinary: Negative.  Negative for hematuria.  Musculoskeletal: Negative.  Negative for back pain.  Skin: Negative.  Negative for rash.  Neurological:  Positive for weakness. Negative for dizziness, focal weakness and headaches.  Psychiatric/Behavioral: Negative.  The patient is not nervous/anxious.     As per HPI. Otherwise, a complete review of systems is negative.  PAST MEDICAL HISTORY: Past Medical History:  Diagnosis Date   Aortic atherosclerosis ([place])    Arthritis    Bilateral carotid  artery stenosis    a.) carotid doppler 05/07/2022: 1-39% BICA   CHF (congestive heart failure), NYHA class II, acute on chronic, systolic (HCC)    a.) TTE 01/09/2015: EF 35%, LVH, inf HK, LAE, triv TR/PR, mild MR, G1DD; b.) TTE 04/09/2017: EF 45%, mod LVH, post HK, triv MR/TR; c.) TTE 07/26/2018: EF 40%, LVH, inf/post HK, LAE, RVE, triv-mild panval regurg, G1DD; d.) TTE 06/01/2019: EF 40%, LVH, api/inf HK, BAE, triv MR/TR/PR, G1DD; e.) TTE 05/20/2021: EF 45%, LVH, LAE, triv MR/TR/PR; f.) TTE 04/12/2022: EF 45-50%, glob HK, BAE, triv MR, G2DD   Chronic kidney disease (CKD), stage II (mild)    Chronic membranous glomerulonephritis    Coronary artery disease    a.) LHC 05/02/1999: 25/25/75% mLAD --> 2.5 x 18 mm Guidant/ACE OTW Tetra   DDD (degenerative disc disease), lumbar    Diabetic neuropathy (HCC)    Dysplastic nevus 08/26/2020   L flank, moderate atypia   Dyspnea on exertion    Elevated troponin    History of bilateral cataract extraction    Hyperlipidemia    Hypertension    Iron deficiency anemia    Ischemic cardiomyopathy    Lymphedema    Melanoma ([place]) 08/20/2015   Right neck. Superficial spreading, arising in nevus. Tumor thickness 1.76mm, Anatomic level IV   Melanoma in situ ([place]) 05/22/2020   R lateral neck ant to scar, exc 05/22/20   Osteomyelitis of great toe of left foot ([place])    Pulmonary mass 04/23/2022   a.)  CT chest 04/23/2022: LLL mass suspicious for bronchogenic neoplasm with associated peripheral changes; b.)  PET CT 22/29/7989: Hypermetabolic LEFT lower lobe mass measuring 8.5 x 4.6 cm (SUV max  23) disease; second foci of metabolic activity slightly more inferior with similar SUV intensity.   RBBB (right bundle branch block)    Recurrent left pleural effusion    Skin cancer    removed l arm   Sleep apnea    a.) s/p uvulectomy 1990   Spinal stenosis of lumbar region with radiculopathy    Swelling of both lower extremities    T2DM (type 2 diabetes mellitus) ([place])     a.) uses Dexcom G6 CGM   Venous ulcer of ankle ([place])     PAST SURGICAL HISTORY: Past Surgical History:  Procedure Laterality Date   CATARACT EXTRACTION W/PHACO Right 05/03/2019   Procedure: CATARACT EXTRACTION PHACO AND INTRAOCULAR LENS PLACEMENT (IOC) RIGHT DIABETIC 6.44 00:58.4 11.0%;  Surgeon: Leandrew Koyanagi, MD;  Location: [facility];  Service: Ophthalmology;  Laterality: Right;  DIABETIC   CATARACT EXTRACTION W/PHACO Left 06/28/2019   Procedure: CATARACT EXTRACTION PHACO AND INTRAOCULAR LENS PLACEMENT (IOC) LEFT DIABETIC 7.99  01:13.4  10.9% ;  Surgeon: Leandrew Koyanagi, MD;  Location: [facility];  Service: Ophthalmology;  Laterality: Left;  Diabetic - insulin and oral meds   [place] Left 05/02/1999   LUMBAR LAMINECTOMY/DECOMPRESSION MICRODISCECTOMY  04/13/2012   Procedure: LUMBAR LAMINECTOMY/DECOMPRESSION MICRODISCECTOMY 1 LEVEL;  Surgeon: Johnn Hai, MD;  Location: [facility];  Service: Orthopedics;  Laterality: Bilateral;  L4-L5   TOE AMPUTATION Left    big toe   TOTAL KNEE ARTHROPLASTY Right 2007   UVULECTOMY  11/14/1988   VIDEO BRONCHOSCOPY WITH ENDOBRONCHIAL ULTRASOUND N/A 05/15/2022   Procedure: VIDEO BRONCHOSCOPY WITH ENDOBRONCHIAL ULTRASOUND;  Surgeon: Ottie Glazier, MD;  Location: [facility];  Service: Thoracic;  Laterality: N/A;    FAMILY HISTORY: History reviewed. No pertinent family history.  ADVANCED DIRECTIVES (Y/N):  N  HEALTH MAINTENANCE: Social History   Tobacco Use   Smoking status: Never   Smokeless tobacco: Never  Vaping Use   Vaping Use: Never used  Substance Use Topics   Alcohol use: Yes    Comment: rare   Drug use: No     Colonoscopy:  PAP:  Bone density:  Lipid panel:  Allergies  Allergen Reactions   Carvedilol Itching, Other (See Comments) and Swelling    Pain, itching and swelling beneath scrotum to anus.   Metoprolol Other (See Comments)    Cant  remember  Cant remember  Cant remember  Cant remember   Toprol Xl [Metoprolol Tartrate] Other (See Comments)    Cant remember   Buprenorphine Itching and Rash   Buprenorphine Hcl Itching and Rash   Morphine Itching and Other (See Comments)   Morphine And Related Itching, Rash and Other (See Comments)   Triamcinolone Rash   Triamcinolone Acetonide Rash    Current Outpatient Medications  Medication Sig Dispense Refill   aspirin EC 81 MG tablet Take 162 mg by mouth daily. Swallow whole.     atorvastatin (LIPITOR) 80 MG tablet Take 80 mg by mouth at bedtime.     Continuous Blood Gluc Receiver (DEXCOM G6 RECEIVER) DEVI Use to monitor blood sugar. [place] [phone]     Continuous Blood Gluc Sensor (DEXCOM G6 SENSOR) MISC Use to monitor blood sugar.  Replace every 10 days. [place] [phone].     Continuous Blood Gluc Transmit (DEXCOM G6 TRANSMITTER) MISC Use to monitor blood sugar.  Replace every 3 months. [place] [phone].     ferrous sulfate (FEROSUL) 325 (65 FE) MG tablet Take 325 mg by  mouth 2 (two) times daily with a meal.     insulin aspart (NOVOLOG) 100 UNIT/ML injection Inject into the skin 3 (three) times daily before meals.     insulin glargine (LANTUS) 100 UNIT/ML injection Inject 34 Units into the skin at bedtime.     Insulin Pen Needle (COMFORT EZ PEN NEEDLES) 32G X 8 MM MISC See admin instructions.     NOVOLOG FLEXPEN 100 UNIT/ML FlexPen INJ UP TO 50 UNI D [place] IN MULTIPLE INJECTIONS B MEALS UTD     spironolactone (ALDACTONE) 25 MG tablet Take 50 mg by mouth every morning.     tacrolimus (PROGRAF) 1 MG capsule Take 1 mg by mouth 2 (two) times daily.     tamsulosin (FLOMAX) 0.4 MG CAPS capsule Take 1 capsule (0.4 mg total) by mouth daily after supper. (Patient taking differently: Take 0.4 mg by mouth daily after breakfast.) 30 capsule 11   torsemide (DEMADEX) 100 MG tablet Take 100 mg by mouth every morning.     Vitamin D, Ergocalciferol, (DRISDOL) 1.25 MG (50000 UNIT) CAPS  capsule Take 1 capsule (50,000 Units total) by mouth every 7 (seven) days. (Patient taking differently: Take 50,000 Units by mouth every 7 (seven) days. Fridays) 12 capsule 0   No current facility-administered medications for this visit.    OBJECTIVE: Vitals:   06/01/22 1110  BP: (!) 111/55  Pulse: 80  Resp: 17  Temp: 97.6 F (36.4 C)  SpO2: 100%     Body mass index is 36.62 kg/m.    ECOG FS:0 - Asymptomatic  General: Well-developed, well-nourished, no acute distress. Eyes: Pink conjunctiva, anicteric sclera. HEENT: Normocephalic, moist mucous membranes. Lungs: No audible wheezing or coughing. Heart: Regular rate and rhythm. Abdomen: Soft, nontender, no obvious distention. Musculoskeletal: No edema, cyanosis, or clubbing. Neuro: Alert, answering all questions appropriately. Cranial nerves grossly intact. Skin: No rashes or petechiae noted. Psych: Normal affect.  LAB RESULTS:  Lab Results  Component Value Date   NA 136 04/26/2022   K 4.1 04/26/2022   CL 99 04/26/2022   CO2 30 04/26/2022   GLUCOSE 185 (H) 04/26/2022   BUN 40 (H) 04/26/2022   CREATININE 1.41 (H) 04/26/2022   CALCIUM 8.0 (L) 04/26/2022   PROT 5.6 (L) 04/15/2022   ALBUMIN 2.9 (L) 04/15/2022   AST 20 04/15/2022   ALT 11 04/15/2022   ALKPHOS 60 04/15/2022   BILITOT 1.2 04/15/2022   GFRNONAA 53 (L) 04/26/2022   GFRAA >60 10/15/2019    Lab Results  Component Value Date   WBC 5.9 05/13/2022   NEUTROABS 4.5 04/11/2022   HGB 9.2 (L) 05/13/2022   HCT 28.7 (L) 05/13/2022   MCV 92.9 05/13/2022   PLT 216 05/13/2022   Lab Results  Component Value Date   IRON 44 (L) 03/25/2022   TIBC 283 03/25/2022   IRONPCTSAT 16 (L) 03/25/2022   Lab Results  Component Value Date   FERRITIN 115 03/25/2022     STUDIES: CT SUPER D CHEST WO [place] PILOT  Result Date: 05/16/2022 CLINICAL DATA:  Lung mass.  Pre-procedure planning. EXAM: CT CHEST WITHOUT CONTRAST TECHNIQUE: Multidetector CT imaging of the chest was  performed using thin slice collimation for electromagnetic bronchoscopy planning purposes, without intravenous contrast. RADIATION DOSE REDUCTION: This exam was performed according to the departmental dose-optimization program which includes automated exposure control, adjustment of the mA and/or kV according to patient size and/or use of iterative reconstruction technique. COMPARISON:  PET-CT 05/12/2022 FINDINGS: Cardiovascular: The heart size is normal. No substantial  pericardial effusion. Coronary artery calcification is evident. Mild atherosclerotic calcification is noted in the wall of the thoracic aorta. Enlargement of the pulmonary outflow tract/main pulmonary arteries suggests pulmonary arterial hypertension. Mediastinum/Nodes: Increased number of mediastinal lymph nodes without lymph node enlargement by CT size criteria. No evidence for gross hilar lymphadenopathy although assessment is limited by the lack of intravenous contrast on the current study. The esophagus has normal imaging features. There is no axillary lymphadenopathy. Lungs/Pleura: No suspicious pulmonary nodule or mass in the right lung. Volume loss noted left hemithorax with 7.5 x 5.2 cm left lower lobe pulmonary mass, stable to minimally increased compared to 04/23/2022. Lateral pleural thickening/loculated fluid in the left chest again noted small dependent left-sided pleural effusion associated. With Upper Abdomen: Unremarkable Musculoskeletal: No worrisome lytic or sclerotic osseous abnormality. IMPRESSION: 1. 7.5 x 5.2 cm left lower lobe pulmonary mass, stable to minimally increased compared to 04/23/2022. 2. Lateral pleural thickening/loculated fluid in the left chest with small dependent left-sided pleural effusion. 3. Enlargement of the pulmonary outflow tract/main pulmonary arteries suggests pulmonary arterial hypertension. 4.  Aortic Atherosclerosis (ICD10-I70.0). Electronically Signed   By: Misty Stanley M.D.   On: 05/16/2022 13:52    DG Chest Port 1 View  Result Date: 05/15/2022 CLINICAL DATA:  Post bronchoscopy EXAM: PORTABLE CHEST 1 VIEW COMPARISON:  Portable exam 1504 hours compared to 04/26/2022 FINDINGS: Enlargement of cardiac silhouette. Mediastinal contours and pulmonary vascularity normal. Atherosclerotic calcification aorta. Atelectasis versus infiltrate LEFT lower lobe. Minimal RIGHT basilar atelectasis. Remaining lungs clear. No pleural effusion or pneumothorax. IMPRESSION: Persistent atelectasis versus infiltrate LEFT lower lobe with minimal RIGHT basilar atelectasis. No pneumothorax following bronchoscopy. Aortic Atherosclerosis (ICD10-I70.0). Electronically Signed   By: Lavonia Dana M.D.   On: 05/15/2022 15:15   DG C-Arm 1-60 Min-No Report  Result Date: 05/15/2022 Fluoroscopy was utilized by the requesting physician.  No radiographic interpretation.   DG C-Arm 1-60 Min-No Report  Result Date: 05/15/2022 Fluoroscopy was utilized by the requesting physician.  No radiographic interpretation.   NM PET Image Initial (PI) Skull Base To Thigh (F-18 FDG)  Result Date: 05/12/2022 CLINICAL DATA:  Initial treatment strategy for pulmonary mass. EXAM: NUCLEAR MEDICINE PET SKULL BASE TO THIGH TECHNIQUE: 14.5 mCi F-18 FDG was injected intravenously. Full-ring PET imaging was performed from the skull base to thigh after the radiotracer. CT data was obtained and used for attenuation correction and anatomic localization. Fasting blood glucose: 144 mg/dl COMPARISON:  Chest CT 04/23/2022 FINDINGS: Mediastinal blood pool activity: SUV max 4.27 Liver activity: SUV max NA NECK: No hypermetabolic lymph nodes in the neck. Incidental CT findings: None. CHEST: Hypermetabolic mass in the LEFT lower lobe measures 8.5 x 4.6 cm with SUV max equal 23. A second focus of metabolic activity slightly inferior with SUV equal intensity associated on image 199. There is fluid and atelectasis in the LEFT lower lobe associated with the mass. There is  loculated LEFT fluid pleural and inter fissural fluid which is not metabolic. No additional hypermetabolic nodules in the LEFT upper lobe. No hypermetabolic nodules in the RIGHT lung. No hypermetabolic hilar or mediastinal lymph nodes. No supraclavicular adenopathy. Incidental CT findings: None. ABDOMEN/PELVIS: No abnormal hypermetabolic activity within the liver, pancreas, adrenal glands, or spleen. No hypermetabolic lymph nodes in the abdomen or pelvis. Incidental CT findings: None. SKELETON: No focal hypermetabolic activity to suggest skeletal metastasis. Incidental CT findings: IMPRESSION: 1. Hypermetabolic mass in the LEFT lower lobe most consistent with bronchogenic carcinoma. 2. No evidence of metastatic adenopathy in the  mediastinum or hilum. 3. No evidence of distant metastatic disease. Electronically Signed   By: Suzy Bouchard M.D.   On: 05/12/2022 13:53   VAS US CAROTID  Result Date: 05/08/2022 Carotid Arterial Duplex Study Patient Name:  LEWELLYN PINAL  Date of Exam:   05/07/2022 Medical Rec #: [id number]       Accession #:    [id number] Date of Birth: 1950-12-31       Patient Gender: M Patient Age:   51 years Exam Location:  [facility] Procedure:      VAS US CAROTID Referring Phys: Hortencia Pilar --------------------------------------------------------------------------------  Indications: Carotid artery disease. Performing Technologist: Almira Coaster RVS  Examination Guidelines: A complete evaluation includes B-mode imaging, spectral Doppler, color Doppler, and power Doppler as needed of all accessible portions of each vessel. Bilateral testing is considered an integral part of a complete examination. Limited examinations for reoccurring indications may be performed as noted.  Right Carotid Findings: +----------+--------+--------+--------+------------------+--------+           PSV cm/sEDV cm/sStenosisPlaque DescriptionComments  +----------+--------+--------+--------+------------------+--------+ CCA Prox  109     14                                         +----------+--------+--------+--------+------------------+--------+ CCA Mid   105     17                                         +----------+--------+--------+--------+------------------+--------+ CCA Distal111     17                                         +----------+--------+--------+--------+------------------+--------+ ICA Prox  66      12                                         +----------+--------+--------+--------+------------------+--------+ ICA Mid   75      17                                         +----------+--------+--------+--------+------------------+--------+ ICA Distal110     25                                         +----------+--------+--------+--------+------------------+--------+ ECA       63      0                                          +----------+--------+--------+--------+------------------+--------+ +----------+--------+-------+--------+-------------------+           PSV cm/sEDV cmsDescribeArm Pressure (mmHG) +----------+--------+-------+--------+-------------------+ [id number]     0                                  +----------+--------+-------+--------+-------------------+ +---------+--------+--+--------+--+ VertebralPSV cm/s56EDV cm/s12 +---------+--------+--+--------+--+  Left Carotid Findings: +----------+--------+--------+--------+------------------+--------+           PSV cm/sEDV cm/sStenosisPlaque DescriptionComments +----------+--------+--------+--------+------------------+--------+ CCA Prox  101     14                                         +----------+--------+--------+--------+------------------+--------+ CCA Mid   128     18                                         +----------+--------+--------+--------+------------------+--------+ CCA Distal128     18                                          +----------+--------+--------+--------+------------------+--------+ ICA Prox  95      16                                         +----------+--------+--------+--------+------------------+--------+ ICA Mid   89      19                                         +----------+--------+--------+--------+------------------+--------+ ICA Distal86      19                                         +----------+--------+--------+--------+------------------+--------+ ECA       115     0                                          +----------+--------+--------+--------+------------------+--------+ +----------+--------+--------+--------+-------------------+           PSV cm/sEDV cm/sDescribeArm Pressure (mmHG) +----------+--------+--------+--------+-------------------+ Subclavian173     0                                   +----------+--------+--------+--------+-------------------+ +---------+--------+--+--------+--+ VertebralPSV cm/s64EDV cm/s16 +---------+--------+--+--------+--+   Summary: Right Carotid: Velocities in the right ICA are consistent with a 1-39% stenosis. Left Carotid: Velocities in the left ICA are consistent with a 1-39% stenosis. Vertebrals:  Bilateral vertebral arteries demonstrate antegrade flow. Subclavians: Normal flow hemodynamics were seen in bilateral subclavian              arteries. *See table(s) above for measurements and observations.  Electronically signed by Hortencia Pilar MD on 05/08/2022 at 7:40:55 AM.    Final    VAS [place] LOWER EXTREMITY VENOUS REFLUX  Result Date: 05/08/2022  Lower Venous Reflux Study Patient Name:  PAVEL PEPITONE  Date of Exam:   05/07/2022 Medical Rec #: [id number]       Accession #:    [id number] Date of Birth: 02-16-1951       Patient Gender: M Patient Age:   31 years Exam Location:  [facility] Procedure:  VAS [place] LOWER EXTREMITY VENOUS REFLUX Referring Phys: [facility]  --------------------------------------------------------------------------------  Indications: Swelling.  Performing Technologist: Almira Coaster RVS  Examination Guidelines: A complete evaluation includes B-mode imaging, spectral Doppler, color Doppler, and power Doppler as needed of all accessible portions of each vessel. Bilateral testing is considered an integral part of a complete examination. Limited examinations for reoccurring indications may be performed as noted. The reflux portion of the exam is performed with the patient in reverse Trendelenburg. Significant venous reflux is defined as >500 ms in the superficial venous system, and >1 second in the deep venous system.  Venous Reflux Times +--------------+---------+------+-----------+------------+--------+ RIGHT         Reflux NoRefluxReflux TimeDiameter cmsComments                         Yes                                  +--------------+---------+------+-----------+------------+--------+ CFV           no                                             +--------------+---------+------+-----------+------------+--------+ FV prox       no                                             +--------------+---------+------+-----------+------------+--------+ FV mid        no                                             +--------------+---------+------+-----------+------------+--------+ FV dist       no                                             +--------------+---------+------+-----------+------------+--------+ Popliteal     no                                             +--------------+---------+------+-----------+------------+--------+ GSV at SFJ    no                            .68              +--------------+---------+------+-----------+------------+--------+ GSV prox thighno                            .49              +--------------+---------+------+-----------+------------+--------+ GSV mid thigh  no                            .34              +--------------+---------+------+-----------+------------+--------+ GSV dist thighno                            .  33              +--------------+---------+------+-----------+------------+--------+ GSV at knee   no                            .43              +--------------+---------+------+-----------+------------+--------+ GSV prox calf no                            .36              +--------------+---------+------+-----------+------------+--------+ SSV Pop Fossa no                            .27              +--------------+---------+------+-----------+------------+--------+  +--------------+---------+------+-----------+------------+--------+ LEFT          Reflux NoRefluxReflux TimeDiameter cmsComments                         Yes                                  +--------------+---------+------+-----------+------------+--------+ CFV           no                                             +--------------+---------+------+-----------+------------+--------+ FV prox       no                                             +--------------+---------+------+-----------+------------+--------+ FV mid        no                                             +--------------+---------+------+-----------+------------+--------+ FV dist       no                                             +--------------+---------+------+-----------+------------+--------+ Popliteal     no                                             +--------------+---------+------+-----------+------------+--------+ GSV at SFJ    no                            .60              +--------------+---------+------+-----------+------------+--------+ GSV prox thighno                            .56              +--------------+---------+------+-----------+------------+--------+ GSV mid thigh no                            .  36               +--------------+---------+------+-----------+------------+--------+ GSV dist thighno                            .41              +--------------+---------+------+-----------+------------+--------+ GSV at knee   no                            .38              +--------------+---------+------+-----------+------------+--------+ GSV prox calf no                            .31              +--------------+---------+------+-----------+------------+--------+ SSV Pop Fossa no                            .18              +--------------+---------+------+-----------+------------+--------+   Summary: Bilateral: - No evidence of deep vein thrombosis seen in the lower extremities, bilaterally, from the common femoral through the popliteal veins. - No evidence of superficial venous thrombosis in the lower extremities, bilaterally. - No evidence of deep venous insufficiency seen bilaterally in the lower extremity. - No evidence of superficial venous reflux seen in the greater saphenous veins bilaterally. - No evidence of superficial venous reflux seen in the short saphenous veins bilaterally.  *See table(s) above for measurements and observations. Electronically signed by Hortencia Pilar MD on 05/08/2022 at 7:40:48 AM.    Final    VAS [place] ABI WITH/WO TBI  Result Date: 05/08/2022  LOWER EXTREMITY DOPPLER STUDY Patient Name:  WAEL CLEMON  Date of Exam:   05/07/2022 Medical Rec #: [id number]       Accession #:    [id number] Date of Birth: May 01, 1950       Patient Gender: M Patient Age:   66 years Exam Location:  [facility] Procedure:      VAS [place] ABI WITH/WO TBI Referring Phys: Hortencia Pilar --------------------------------------------------------------------------------  Indications: Rest pain.  Performing Technologist: Almira Coaster RVS  Examination Guidelines: A complete evaluation includes at minimum, Doppler waveform signals and systolic blood pressure reading at the level of  bilateral brachial, anterior tibial, and posterior tibial arteries, when vessel segments are accessible. Bilateral testing is considered an integral part of a complete examination. Photoelectric Plethysmograph (PPG) waveforms and toe systolic pressure readings are included as required and additional duplex testing as needed. Limited examinations for reoccurring indications may be performed as noted.  ABI Findings: +---------+------------------+-----+---------+--------+ Right    Rt Pressure (mmHg)IndexWaveform Comment  +---------+------------------+-----+---------+--------+ Brachial 155                                      +---------+------------------+-----+---------+--------+ ATA      213               1.37 triphasicNC       +---------+------------------+-----+---------+--------+ PTA      214               1.38 biphasic [place]       +---------+------------------+-----+---------+--------+ [facility]  0.94 Normal            +---------+------------------+-----+---------+--------+ +---------+------------------+-----+---------+-------+ Left     Lt Pressure (mmHg)IndexWaveform Comment +---------+------------------+-----+---------+-------+ Brachial 154                                     +---------+------------------+-----+---------+-------+ ATA      208               1.34 triphasicNC      +---------+------------------+-----+---------+-------+ PTA      212               1.37 biphasic [place]      +---------+------------------+-----+---------+-------+ Great Toe138               0.89 Normal           +---------+------------------+-----+---------+-------+ +-------+-----------+-----------+------------+------------+ ABI/TBIToday's ABIToday's TBIPrevious ABIPrevious TBI +-------+-----------+-----------+------------+------------+ Right  >1.0 [place]    .94                                 +-------+-----------+-----------+------------+------------+ Left    >1.0 [place]    .89                                 +-------+-----------+-----------+------------+------------+  Summary: Right: Resting right ankle-brachial index indicates noncompressible right lower extremity arteries. The right toe-brachial index is normal. Left: Resting left ankle-brachial index indicates noncompressible left lower extremity arteries. The left toe-brachial index is normal. *See table(s) above for measurements and observations.   Electronically signed by Hortencia Pilar MD on 05/08/2022 at 7:40:41 AM.    Final     ASSESSMENT: IStage IV melanoma with lung metastasis.  PLAN:   Melanoma: Patient had Mohs procedure on his right neck to remove a malignant melanoma in approximately 2017 by report was T2a, N0, M0 lesion or stage Ib.  PET scan results from May 12, 2022 reviewed independently and report as above with hypermetabolic left lower lobe lung mass.  Biopsy consistent with malignant melanoma.  Given the likely malignant pleural effusion, patient is not a surgical candidate therefore we will proceed with combination immunotherapy using ipilimumab and nivolumab every 3 weeks x 4 followed by high-dose nivolumab every 4 weeks.  Patient has declined port placement.  Patient will return to clinic in 1 week to initiate cycle 1 of treatment. Iron deficiency anemia: Patient's most recent hemoglobin is 9.2. Previously, all of his other laboratory work other is either negative or within normal limits.  He last received IV Venofer on April 13, 2022.  Monitor closely throughout treatment.   MGUS: Resolved.  Most recent M spike was not observed.   Nephrotic range proteinuria: Case discussed with nephrology.  Patient never received 1000 mg of Rituxan.  Kidney function has improved, will hold treatment at this time.   Renal insufficiency: Patient's most recent creatinine is 1.41.  Treatment does not need to be dose reduced in the setting of renal insufficiency.  Patient expressed understanding  and was in agreement with this plan. He also understands that He can call clinic at any time with any questions, concerns, or complaints.    Lloyd Huger, MD   06/01/2022 2:05 PM

## 2022-06-04 ENCOUNTER — Inpatient Hospital Stay: Payer: Medicare HMO

## 2022-06-04 ENCOUNTER — Ambulatory Visit: Payer: Self-pay | Admitting: *Deleted

## 2022-06-04 DIAGNOSIS — C434 Malignant melanoma of scalp and neck: Secondary | ICD-10-CM

## 2022-06-04 NOTE — Research (Addendum)
Trial Name:  S2013 - Immune Checkpoint Inhibitor Toxicity (I-CHECKIT): A Prospective Observational Study   Patient [name] was identified by this nurse as a potential candidate for the above listed study.  This Clinical Research Nurse met with [name], [id number] on 06/04/22 in a manner and location that ensures patient privacy to discuss participation in the above listed research study.  Patient is Accompanied by his son, [name] .  Patient was previously provided with informed consent documents.  Patient has not yet read the informed consent documents and so documents were reviewed page by page today.  As outlined in the informed consent form, this Nurse and [name] discussed the purpose of the research study, the investigational nature of the study, study procedures and requirements for study participation, potential risks and benefits of study participation, as well as alternatives to participation.  This study is not blinded or double-blinded. The patient understands participation is voluntary and they may withdraw from study participation at any time.  This study does not involve randomization.  This study does not involve an investigational drug or device. This study does not involve a placebo. Patient understands enrollment is pending full eligibility review.   Confidentiality and how the patient's information will be used as part of study participation were discussed.  Patient was informed there is not reimbursement provided for their time and effort spent on trial participation.  The patient is encouraged to discuss research study participation with their insurance provider to determine what costs they may incur as part of study participation, including research related injury.    All questions were answered to patient's satisfaction.  The informed consent and separate HIPAA Authorization was reviewed page by page.  The patient's mental and emotional status is appropriate to  provide informed consent, and the patient verbalizes an understanding of study participation.  Patient has agreed to participate in the above listed research study and has voluntarily signed the informed consent version dated 12/15/2021, active date of 02/26/2022 and separate HIPAA Authorization, version approved on 01/08/2022  on 06/04/22 at 1118 AM.  The patient was provided with a copy of the signed informed consent form and separate HIPAA Authorization for their reference.  No study specific procedures were obtained prior to the signing of the informed consent document.  Approximately 45 minutes were spent with the patient reviewing the informed consent documents.  After obtaining informed consent patient, voluntarily signed the optional Release of Information form for use throughout trial participation.  Stratification factors for this protocol are single agent immunotherapy vs multiple agent immunotherapy: Patient will receive Nivolumab and Ipilimumab every 21 days per treatment plan.    Eligibility criteria reviewed with patient. This Nurse has reviewed this patient's inclusion and exclusion criteria and confirmed patient is eligible for study participation. Eligibility confirmed by treating investigator, who also agrees that patient should proceed with enrollment after second eligibility verification by another research coordinator / nurse.  Patient will continue with enrollment. Patient has refused to take part in the optional specimen research study with blood, tissue and stool specimen submission. He does consent to be contacted for participation in other research in the future.  Patient completed all of the protocol required questionnaires for the baseline visit after his consent was obtained.   Jeral Fruit, RN 06/04/22 12:29 PM

## 2022-06-04 NOTE — Research (Signed)
S2013 - Immune Checkpoint Inhibitor Toxicity (I-CHECKIT): A Prospective Observational Study  Confirmed with RN Jeral Fruit that patient completed PROs prior to registration.  This Nurse has reviewed this patient's inclusion and exclusion criteria as a second review and confirms [name] is eligible for study participation.  Patient may continue with enrollment.  Marjie Skiff Khristen Cheyney, RN, BSN, [facility]  Her  Hers Clinical Research Nurse [facility] Direct Dial [phone]  Pager [phone] 06/04/2022 12:48 PM

## 2022-06-04 NOTE — Patient Outreach (Signed)
  Care Coordination   Follow Up Visit Note   06/04/2022 Name: [name] MRN: [id number] DOB: [birth year]  [name] is a 72 y.o. year old male who sees Andrew Pitch, MD for primary care. I spoke with  [name] by phone today.  What matters to the patients health and wellness today?  Report lung mass biopsy turned out to be cancer, skin Melanoma that went to the lungs.  Son present to provide support.    Goals Addressed             This Visit's Progress    Effective management of CHF   On track    Care Coordination Interventions: Basic overview and discussion of pathophysiology of Heart Failure reviewed Provided education on low sodium diet Reviewed Heart Failure Action Plan in depth and provided written copy Assessed need for readable accurate scales in home Provided education about placing scale on hard, flat surface Discussed importance of daily weight and advised patient to weigh and record daily Reviewed role of diuretics in prevention of fluid overload and management of heart failure; Discussed the importance of keeping all appointments with provider Follows up with oncology on 3/25, cardiology on 4/2.   Now active with pulmonary/cardiac rehab, had not been in a while due to shortness of breath, denies any current shortness of breath.   Report oxygen levels in the high 90s.       Effective treatment of lung cancer       Care Coordination Interventions: Assessed patient understanding of cancer diagnosis and recommended treatment plan Reviewed upcoming provider appointments and treatment appointments Assessed available transportation to appointments and treatments. Has consistent/reliable transportation: Yes Assessed support system. Has consistent/reliable family or other support: Yes First treatment on 3/25         SDOH assessments and interventions completed:  No     Care Coordination Interventions:  Yes, provided   Follow up plan: Follow up  call scheduled for 4/23    Encounter Outcome:  Pt. Visit Completed   Andrew David, RN, MSN, [place] [facility] Care Management Coordinator [phone]

## 2022-06-04 NOTE — Research (Signed)
Trial: DCP-001: Use of a Clinical Trial Screening Tool to Address Cancer Health Disparities in the [facility] [place])   Patient [name] was identified by this nurse as a potential candidate for the above listed study.  This Clinical Research Nurse met with [name], [name], on 06/04/22 in a manner and location that ensures patient privacy to discuss participation in the above listed research study.  Patient is Accompanied by [name] .  A copy of the informed consent document and separate HIPAA Authorization was provided to the patient.  Patient reads, speaks, and understands [place].    Patient was provided with the business card of this Nurse and encouraged to contact the research team with any questions.  Patient was provided the option of taking informed consent documents home to review and was encouraged to review at their convenience with their support network, including other care providers. Patient is comfortable with making a decision regarding study participation today.  As outlined in the informed consent form, this Nurse and [name] discussed the purpose of the research study, the investigational nature of the study, study procedures and requirements for study participation, potential risks and benefits of study participation, as well as alternatives to participation. This study is not blinded. The patient understands participation is voluntary and they may withdraw from study participation at any time.  This study does not involve randomization.  This study does not involve an investigational drug or device. This study does not involve a placebo. Patient understands enrollment is pending full eligibility review.   Confidentiality and how the patient's information will be used as part of study participation were discussed.  Patient was informed there is not reimbursement provided for their time and effort spent on trial participation.   The patient is encouraged to discuss research study participation with their insurance provider to determine what costs they may incur as part of study participation, including research related injury.    All questions were answered to patient's satisfaction.  The informed consent and separate HIPAA Authorization was reviewed page by page.  The patient's mental and emotional status is appropriate to provide informed consent, and the patient verbalizes an understanding of study participation.  Patient has agreed to participate in the above listed research study and has voluntarily signed the informed consent dated 04/14/2021, and active date of 0/18/2023 and separate HIPAA Authorization, version 15  on 06/04/22 at 1130AM.  The patient was provided with a copy of the signed informed consent form and separate HIPAA Authorization for their reference.  No study specific procedures were obtained prior to the signing of the informed consent document.  Approximately 30 minutes were spent with the patient reviewing the informed consent documents.  After obtaining informed consent patient, voluntarily signed the optional Release of Information form for use throughout trial participation. Eligibility criteria reviewed with patient. This Nurse has reviewed this patient's inclusion and exclusion criteria and confirmed patient is eligible for study participation. Eligibility confirmed by treating investigator, who also agrees that patient should proceed with enrollment. Patient will continue with enrollment. DCP-001 patient worksheet was completed by the patient after his consent was obtained for the protocol.  Andrew Fruit, RN 06/04/22 3:35 PM  .

## 2022-06-08 ENCOUNTER — Inpatient Hospital Stay: Payer: Medicare HMO

## 2022-06-08 ENCOUNTER — Ambulatory Visit (INDEPENDENT_AMBULATORY_CARE_PROVIDER_SITE_OTHER): Payer: Medicare HMO | Admitting: Vascular Surgery

## 2022-06-08 ENCOUNTER — Encounter: Payer: Self-pay | Admitting: Oncology

## 2022-06-08 ENCOUNTER — Inpatient Hospital Stay (HOSPITAL_BASED_OUTPATIENT_CLINIC_OR_DEPARTMENT_OTHER): Payer: Medicare HMO | Admitting: Oncology

## 2022-06-08 DIAGNOSIS — D509 Iron deficiency anemia, unspecified: Secondary | ICD-10-CM

## 2022-06-08 DIAGNOSIS — C434 Malignant melanoma of scalp and neck: Secondary | ICD-10-CM

## 2022-06-08 DIAGNOSIS — D472 Monoclonal gammopathy: Secondary | ICD-10-CM

## 2022-06-08 DIAGNOSIS — C7800 Secondary malignant neoplasm of unspecified lung: Secondary | ICD-10-CM | POA: Diagnosis not present

## 2022-06-08 DIAGNOSIS — Z5112 Encounter for antineoplastic immunotherapy: Secondary | ICD-10-CM | POA: Diagnosis not present

## 2022-06-08 DIAGNOSIS — Z79620 Long term (current) use of immunosuppressive biologic: Secondary | ICD-10-CM | POA: Diagnosis not present

## 2022-06-08 LAB — CBC WITH DIFFERENTIAL (CANCER CENTER ONLY)
Abs Immature Granulocytes: 0.04 10*3/uL (ref 0.00–0.07)
Basophils Absolute: 0 10*3/uL (ref 0.0–0.1)
Basophils Relative: 1 %
Eosinophils Absolute: 0.1 10*3/uL (ref 0.0–0.5)
Eosinophils Relative: 1 %
HCT: 33.4 % — ABNORMAL LOW (ref 39.0–52.0)
Hemoglobin: 11 g/dL — ABNORMAL LOW (ref 13.0–17.0)
Immature Granulocytes: 1 %
Lymphocytes Relative: 11 %
Lymphs Abs: 0.9 10*3/uL (ref 0.7–4.0)
MCH: 29.9 pg (ref 26.0–34.0)
MCHC: 32.9 g/dL (ref 30.0–36.0)
MCV: 90.8 fL (ref 80.0–100.0)
Monocytes Absolute: 0.5 10*3/uL (ref 0.1–1.0)
Monocytes Relative: 5 %
Neutro Abs: 6.8 10*3/uL (ref 1.7–7.7)
Neutrophils Relative %: 81 %
Platelet Count: 225 10*3/uL (ref 150–400)
RBC: 3.68 MIL/uL — ABNORMAL LOW (ref 4.22–5.81)
RDW: 14.1 % (ref 11.5–15.5)
WBC Count: 8.3 10*3/uL (ref 4.0–10.5)
nRBC: 0 % (ref 0.0–0.2)

## 2022-06-08 LAB — CMP (CANCER CENTER ONLY)
ALT: 19 U/L (ref 0–44)
AST: 20 U/L (ref 15–41)
Albumin: 3.2 g/dL — ABNORMAL LOW (ref 3.5–5.0)
Alkaline Phosphatase: 72 U/L (ref 38–126)
Anion gap: 6 (ref 5–15)
BUN: 52 mg/dL — ABNORMAL HIGH (ref 8–23)
CO2: 26 mmol/L (ref 22–32)
Calcium: 8.8 mg/dL — ABNORMAL LOW (ref 8.9–10.3)
Chloride: 103 mmol/L (ref 98–111)
Creatinine: 1.35 mg/dL — ABNORMAL HIGH (ref 0.61–1.24)
GFR, Estimated: 55 mL/min — ABNORMAL LOW (ref >60)
Glucose, Bld: 177 mg/dL — ABNORMAL HIGH (ref 70–99)
Potassium: 4.7 mmol/L (ref 3.5–5.1)
Sodium: 135 mmol/L (ref 135–145)
Total Bilirubin: 1.1 mg/dL (ref 0.3–1.2)
Total Protein: 6.8 g/dL (ref 6.5–8.1)

## 2022-06-08 LAB — IRON AND TIBC
Iron: 74 ug/dL (ref 45–182)
Saturation Ratios: 26 % (ref 17.9–39.5)
TIBC: 286 ug/dL (ref 250–450)
UIBC: 212 ug/dL

## 2022-06-08 LAB — TSH: TSH: 3.458 u[IU]/mL (ref 0.350–4.500)

## 2022-06-08 LAB — FERRITIN: Ferritin: 176 ng/mL (ref 24–336)

## 2022-06-08 MED ORDER — DIPHENHYDRAMINE HCL 50 MG/ML IJ SOLN
25.0000 mg | Freq: Once | INTRAMUSCULAR | Status: AC
  Administered 2022-06-08: 25 mg via INTRAVENOUS
  Filled 2022-06-08: qty 1

## 2022-06-08 MED ORDER — SODIUM CHLORIDE 0.9 % IV SOLN
Freq: Once | INTRAVENOUS | Status: AC
  Filled 2022-06-08: qty 250

## 2022-06-08 MED ORDER — FAMOTIDINE IN NACL 20-0.9 MG/50ML-% IV SOLN
20.0000 mg | Freq: Once | INTRAVENOUS | Status: AC
  Administered 2022-06-08: 20 mg via INTRAVENOUS
  Filled 2022-06-08: qty 50

## 2022-06-08 MED ORDER — SODIUM CHLORIDE 0.9 % IV SOLN
3.0000 mg/kg | Freq: Once | INTRAVENOUS | Status: DC
  Filled 2022-06-08: qty 70

## 2022-06-08 MED ORDER — SODIUM CHLORIDE 0.9 % IV SOLN
120.0000 mg | Freq: Once | INTRAVENOUS | Status: AC
  Administered 2022-06-08: 120 mg via INTRAVENOUS
  Filled 2022-06-08: qty 10

## 2022-06-08 MED ORDER — SODIUM CHLORIDE 0.9 % IV SOLN
3.0000 mg/kg | Freq: Once | INTRAVENOUS | Status: AC
  Administered 2022-06-08: 350 mg via INTRAVENOUS
  Filled 2022-06-08: qty 30

## 2022-06-08 NOTE — Progress Notes (Signed)
[place]  [phone]) [phone] Fax:([phone]) [phone]  ID: [name] OB: 04/28/1950  MR#: [id number]  CSN#:[id number]  Patient Care Team: Juluis Pitch, MD as PCP - General (Family Medicine) Efrain Sella, MD as Consulting Physician (Gastroenterology) Lloyd Huger, MD as Consulting Physician (Oncology) Valente David, RN as [facility]  CHIEF COMPLAINT: Stage IV melanoma with lung metastasis.  INTERVAL HISTORY: Patient returns to clinic today for further evaluation, discussion of his pathology and imaging results, and treatment planning.  He has chronic weakness and fatigue as well as chronic shortness of breath, but otherwise feels well.  He has no neurologic complaints.  He denies any fevers.  He has a good appetite and denies weight loss.  He has no chest pain, cough, or hemoptysis.  He denies any nausea, vomiting, constipation, or diarrhea.  He has no melena or hematochezia.  He has no urinary complaints.  Patient offers no further specific complaints today.  REVIEW OF SYSTEMS:   Review of Systems  Constitutional:  Positive for malaise/fatigue. Negative for fever and weight loss.  Respiratory:  Positive for shortness of breath. Negative for cough and hemoptysis.   Cardiovascular: Negative.  Negative for chest pain and leg swelling.  Gastrointestinal: Negative.  Negative for abdominal pain, blood in stool and melena.  Genitourinary: Negative.  Negative for hematuria.  Musculoskeletal: Negative.  Negative for back pain.  Skin: Negative.  Negative for rash.  Neurological:  Positive for weakness. Negative for dizziness, focal weakness and headaches.  Psychiatric/Behavioral: Negative.  The patient is not nervous/anxious.     As per HPI. Otherwise, a complete review of systems is negative.  PAST MEDICAL HISTORY: Past Medical History:  Diagnosis Date  . Aortic atherosclerosis ([place])   . Arthritis   . Bilateral carotid  artery stenosis    a.) carotid doppler 05/07/2022: 1-39% BICA  . CHF (congestive heart failure), NYHA class II, acute on chronic, systolic (HCC)    a.) TTE 01/09/2015: EF 35%, LVH, inf HK, LAE, triv TR/PR, mild MR, G1DD; b.) TTE 04/09/2017: EF 45%, mod LVH, post HK, triv MR/TR; c.) TTE 07/26/2018: EF 40%, LVH, inf/post HK, LAE, RVE, triv-mild panval regurg, G1DD; d.) TTE 06/01/2019: EF 40%, LVH, api/inf HK, BAE, triv MR/TR/PR, G1DD; e.) TTE 05/20/2021: EF 45%, LVH, LAE, triv MR/TR/PR; f.) TTE 04/12/2022: EF 45-50%, glob HK, BAE, triv MR, G2DD  . Chronic kidney disease (CKD), stage II (mild)   . Chronic membranous glomerulonephritis   . Coronary artery disease    a.) LHC 05/02/1999: 25/25/75% mLAD --> 2.5 x 18 mm Guidant/ACE OTW Tetra  . DDD (degenerative disc disease), lumbar   . Diabetic neuropathy ([place])   . Dysplastic nevus 08/26/2020   L flank, moderate atypia  . Dyspnea on exertion   . Elevated troponin   . History of bilateral cataract extraction   . Hyperlipidemia   . Hypertension   . Iron deficiency anemia   . Ischemic cardiomyopathy   . Lymphedema   . Melanoma ([place]) 08/20/2015   Right neck. Superficial spreading, arising in nevus. Tumor thickness 1.37mm, Anatomic level IV  . Melanoma in situ ([place]) 05/22/2020   R lateral neck ant to scar, exc 05/22/20  . Osteomyelitis of great toe of left foot ([place])   . Pulmonary mass 04/23/2022   a.)  CT chest 04/23/2022: LLL mass suspicious for bronchogenic neoplasm with associated peripheral changes; b.)  PET CT [id number]: Hypermetabolic LEFT lower lobe mass measuring 8.5 x 4.6 cm (SUV max  23) disease; second foci of metabolic activity slightly more inferior with similar SUV intensity.  . RBBB (right bundle branch block)   . Recurrent left pleural effusion   . Skin cancer    removed l arm  . Sleep apnea    a.) s/p uvulectomy 1990  . Spinal stenosis of lumbar region with radiculopathy   . Swelling of both lower extremities   . T2DM (type  2 diabetes mellitus) ([place])    a.) uses Dexcom G6 CGM  . Venous ulcer of ankle ([place])     PAST SURGICAL HISTORY: Past Surgical History:  Procedure Laterality Date  . CATARACT EXTRACTION W/PHACO Right 05/03/2019   Procedure: CATARACT EXTRACTION PHACO AND INTRAOCULAR LENS PLACEMENT (IOC) RIGHT DIABETIC 6.44 00:58.4 11.0%;  Surgeon: Leandrew Koyanagi, MD;  Location: [facility];  Service: Ophthalmology;  Laterality: Right;  DIABETIC  . CATARACT EXTRACTION W/PHACO Left 06/28/2019   Procedure: CATARACT EXTRACTION PHACO AND INTRAOCULAR LENS PLACEMENT (IOC) LEFT DIABETIC 7.99  01:13.4  10.9% ;  Surgeon: Leandrew Koyanagi, MD;  Location: [facility];  Service: Ophthalmology;  Laterality: Left;  Diabetic - insulin and oral meds  . CHOLECYSTECTOMY  1985  . CORONARY ANGIOPLASTY WITH STENT PLACEMENT Left 05/02/1999  . LUMBAR LAMINECTOMY/DECOMPRESSION MICRODISCECTOMY  04/13/2012   Procedure: LUMBAR LAMINECTOMY/DECOMPRESSION MICRODISCECTOMY 1 LEVEL;  Surgeon: Johnn Hai, MD;  Location: [facility];  Service: Orthopedics;  Laterality: Bilateral;  L4-L5  . TOE AMPUTATION Left    big toe  . TOTAL KNEE ARTHROPLASTY Right 2007  . UVULECTOMY  11/14/1988  . VIDEO BRONCHOSCOPY WITH ENDOBRONCHIAL ULTRASOUND N/A 05/15/2022   Procedure: VIDEO BRONCHOSCOPY WITH ENDOBRONCHIAL ULTRASOUND;  Surgeon: Ottie Glazier, MD;  Location: [facility];  Service: Thoracic;  Laterality: N/A;    FAMILY HISTORY: History reviewed. No pertinent family history.  ADVANCED DIRECTIVES (Y/N):  N  HEALTH MAINTENANCE: Social History   Tobacco Use  . Smoking status: Never  . Smokeless tobacco: Never  Vaping Use  . Vaping Use: Never used  Substance Use Topics  . Alcohol use: Yes    Comment: rare  . Drug use: No     Colonoscopy:  PAP:  Bone density:  Lipid panel:  Allergies  Allergen Reactions  . Carvedilol Itching, Other (See Comments) and Swelling    Pain, itching and swelling beneath scrotum to anus.   . Metoprolol Other (See Comments)    Cant remember  Cant remember  Cant remember  Cant remember  . Toprol Xl [Metoprolol Tartrate] Other (See Comments)    Cant remember  . Buprenorphine Itching and Rash  . Buprenorphine Hcl Itching and Rash  . Morphine Itching and Other (See Comments)  . Morphine And Related Itching, Rash and Other (See Comments)  . Triamcinolone Rash  . Triamcinolone Acetonide Rash    Current Outpatient Medications  Medication Sig Dispense Refill  . aspirin EC 81 MG tablet Take 162 mg by mouth daily. Swallow whole.    Marland Kitchen atorvastatin (LIPITOR) 80 MG tablet Take 80 mg by mouth at bedtime.    . Continuous Blood Gluc Receiver (DEXCOM G6 RECEIVER) DEVI Use to monitor blood sugar. [place] [phone]    . Continuous Blood Gluc Sensor (DEXCOM G6 SENSOR) MISC Use to monitor blood sugar.  Replace every 10 days. [place] [phone].    Marland Kitchen Continuous Blood Gluc Transmit (DEXCOM G6 TRANSMITTER) MISC Use to monitor blood sugar.  Replace every 3 months. [place] [phone].    . ferrous sulfate (FEROSUL) 325 (65 FE) MG tablet Take 325 mg by  mouth 2 (two) times daily with a meal.    . insulin aspart (NOVOLOG) 100 UNIT/ML injection Inject into the skin 3 (three) times daily before meals.    . insulin glargine (LANTUS) 100 UNIT/ML injection Inject 34 Units into the skin at bedtime.    . Insulin Pen Needle (COMFORT EZ PEN NEEDLES) 32G X 8 MM MISC See admin instructions.    Marland Kitchen NOVOLOG FLEXPEN 100 UNIT/ML FlexPen INJ UP TO 50 UNI D [place] IN MULTIPLE INJECTIONS B MEALS UTD    . prochlorperazine (COMPAZINE) 10 MG tablet Take 1 tablet (10 mg total) by mouth every 6 (six) hours as needed for nausea or vomiting. 30 tablet 1  . spironolactone (ALDACTONE) 25 MG tablet Take 50 mg by mouth every morning.    . tacrolimus (PROGRAF) 1 MG capsule Take 1 mg by mouth 2 (two) times daily.    . tamsulosin (FLOMAX) 0.4 MG CAPS capsule Take 1 capsule (0.4 mg total) by mouth daily after supper. (Patient taking  differently: Take 0.4 mg by mouth daily after breakfast.) 30 capsule 11  . torsemide (DEMADEX) 100 MG tablet Take 100 mg by mouth every morning.    . Vitamin D, Ergocalciferol, (DRISDOL) 1.25 MG (50000 UNIT) CAPS capsule Take 1 capsule (50,000 Units total) by mouth every 7 (seven) days. (Patient taking differently: Take 50,000 Units by mouth every 7 (seven) days. Fridays) 12 capsule 0   No current facility-administered medications for this visit.    OBJECTIVE: Vitals:   06/08/22 0919  BP: (!) 140/52  Pulse: 79  Resp: 18  Temp: (!) 97.5 F (36.4 C)  SpO2: 100%     Body mass index is 37.16 kg/m.    ECOG FS:0 - Asymptomatic  General: Well-developed, well-nourished, no acute distress. Eyes: Pink conjunctiva, anicteric sclera. HEENT: Normocephalic, moist mucous membranes. Lungs: No audible wheezing or coughing. Heart: Regular rate and rhythm. Abdomen: Soft, nontender, no obvious distention. Musculoskeletal: No edema, cyanosis, or clubbing. Neuro: Alert, answering all questions appropriately. Cranial nerves grossly intact. Skin: No rashes or petechiae noted. Psych: Normal affect.  LAB RESULTS:  Lab Results  Component Value Date   NA 135 06/08/2022   K 4.7 06/08/2022   CL 103 06/08/2022   CO2 26 06/08/2022   GLUCOSE 177 (H) 06/08/2022   BUN 52 (H) 06/08/2022   CREATININE 1.35 (H) 06/08/2022   CALCIUM 8.8 (L) 06/08/2022   PROT 6.8 06/08/2022   ALBUMIN 3.2 (L) 06/08/2022   AST 20 06/08/2022   ALT 19 06/08/2022   ALKPHOS 72 06/08/2022   BILITOT 1.1 06/08/2022   GFRNONAA 55 (L) 06/08/2022   GFRAA >60 10/15/2019    Lab Results  Component Value Date   WBC 8.3 06/08/2022   NEUTROABS 6.8 06/08/2022   HGB 11.0 (L) 06/08/2022   HCT 33.4 (L) 06/08/2022   MCV 90.8 06/08/2022   PLT 225 06/08/2022   Lab Results  Component Value Date   IRON 44 (L) 03/25/2022   TIBC 283 03/25/2022   IRONPCTSAT 16 (L) 03/25/2022   Lab Results  Component Value Date   FERRITIN 115  03/25/2022     STUDIES: CT SUPER D CHEST WO [place] PILOT  Result Date: 05/16/2022 CLINICAL DATA:  Lung mass.  Pre-procedure planning. EXAM: CT CHEST WITHOUT CONTRAST TECHNIQUE: Multidetector CT imaging of the chest was performed using thin slice collimation for electromagnetic bronchoscopy planning purposes, without intravenous contrast. RADIATION DOSE REDUCTION: This exam was performed according to the departmental dose-optimization program which includes automated exposure control, adjustment  of the mA and/or kV according to patient size and/or use of iterative reconstruction technique. COMPARISON:  PET-CT 05/12/2022 FINDINGS: Cardiovascular: The heart size is normal. No substantial pericardial effusion. Coronary artery calcification is evident. Mild atherosclerotic calcification is noted in the wall of the thoracic aorta. Enlargement of the pulmonary outflow tract/main pulmonary arteries suggests pulmonary arterial hypertension. Mediastinum/Nodes: Increased number of mediastinal lymph nodes without lymph node enlargement by CT size criteria. No evidence for gross hilar lymphadenopathy although assessment is limited by the lack of intravenous contrast on the current study. The esophagus has normal imaging features. There is no axillary lymphadenopathy. Lungs/Pleura: No suspicious pulmonary nodule or mass in the right lung. Volume loss noted left hemithorax with 7.5 x 5.2 cm left lower lobe pulmonary mass, stable to minimally increased compared to 04/23/2022. Lateral pleural thickening/loculated fluid in the left chest again noted small dependent left-sided pleural effusion associated. With Upper Abdomen: Unremarkable Musculoskeletal: No worrisome lytic or sclerotic osseous abnormality. IMPRESSION: 1. 7.5 x 5.2 cm left lower lobe pulmonary mass, stable to minimally increased compared to 04/23/2022. 2. Lateral pleural thickening/loculated fluid in the left chest with small dependent left-sided pleural  effusion. 3. Enlargement of the pulmonary outflow tract/main pulmonary arteries suggests pulmonary arterial hypertension. 4.  Aortic Atherosclerosis (ICD10-I70.0). Electronically Signed   By: Misty Stanley M.D.   On: 05/16/2022 13:52   DG Chest Port 1 View  Result Date: 05/15/2022 CLINICAL DATA:  Post bronchoscopy EXAM: PORTABLE CHEST 1 VIEW COMPARISON:  Portable exam 1504 hours compared to 04/26/2022 FINDINGS: Enlargement of cardiac silhouette. Mediastinal contours and pulmonary vascularity normal. Atherosclerotic calcification aorta. Atelectasis versus infiltrate LEFT lower lobe. Minimal RIGHT basilar atelectasis. Remaining lungs clear. No pleural effusion or pneumothorax. IMPRESSION: Persistent atelectasis versus infiltrate LEFT lower lobe with minimal RIGHT basilar atelectasis. No pneumothorax following bronchoscopy. Aortic Atherosclerosis (ICD10-I70.0). Electronically Signed   By: Lavonia Dana M.D.   On: 05/15/2022 15:15   DG C-Arm 1-60 Min-No Report  Result Date: 05/15/2022 Fluoroscopy was utilized by the requesting physician.  No radiographic interpretation.   DG C-Arm 1-60 Min-No Report  Result Date: 05/15/2022 Fluoroscopy was utilized by the requesting physician.  No radiographic interpretation.   NM PET Image Initial (PI) Skull Base To Thigh (F-18 FDG)  Result Date: 05/12/2022 CLINICAL DATA:  Initial treatment strategy for pulmonary mass. EXAM: NUCLEAR MEDICINE PET SKULL BASE TO THIGH TECHNIQUE: 14.5 mCi F-18 FDG was injected intravenously. Full-ring PET imaging was performed from the skull base to thigh after the radiotracer. CT data was obtained and used for attenuation correction and anatomic localization. Fasting blood glucose: 144 mg/dl COMPARISON:  Chest CT 04/23/2022 FINDINGS: Mediastinal blood pool activity: SUV max 4.27 Liver activity: SUV max NA NECK: No hypermetabolic lymph nodes in the neck. Incidental CT findings: None. CHEST: Hypermetabolic mass in the LEFT lower lobe measures 8.5  x 4.6 cm with SUV max equal 23. A second focus of metabolic activity slightly inferior with SUV equal intensity associated on image 199. There is fluid and atelectasis in the LEFT lower lobe associated with the mass. There is loculated LEFT fluid pleural and inter fissural fluid which is not metabolic. No additional hypermetabolic nodules in the LEFT upper lobe. No hypermetabolic nodules in the RIGHT lung. No hypermetabolic hilar or mediastinal lymph nodes. No supraclavicular adenopathy. Incidental CT findings: None. ABDOMEN/PELVIS: No abnormal hypermetabolic activity within the liver, pancreas, adrenal glands, or spleen. No hypermetabolic lymph nodes in the abdomen or pelvis. Incidental CT findings: None. SKELETON: No focal hypermetabolic activity to  suggest skeletal metastasis. Incidental CT findings: IMPRESSION: 1. Hypermetabolic mass in the LEFT lower lobe most consistent with bronchogenic carcinoma. 2. No evidence of metastatic adenopathy in the mediastinum or hilum. 3. No evidence of distant metastatic disease. Electronically Signed   By: Suzy Bouchard M.D.   On: 05/12/2022 13:53    ASSESSMENT: IStage IV melanoma with lung metastasis.  PLAN:   Melanoma: Patient had Mohs procedure on his right neck to remove a malignant melanoma in approximately 2017 by report was T2a, N0, M0 lesion or stage Ib.  PET scan results from May 12, 2022 reviewed independently and report as above with hypermetabolic left lower lobe lung mass.  Biopsy consistent with malignant melanoma.  Given the likely malignant pleural effusion, patient is not a surgical candidate therefore we will proceed with combination immunotherapy using ipilimumab and nivolumab every 3 weeks x 4 followed by high-dose nivolumab every 4 weeks.  Patient has declined port placement.  Patient will return to clinic in 1 week to initiate cycle 1 of treatment. Iron deficiency anemia: Patient's most recent hemoglobin is 9.2. Previously, all of his other  laboratory work other is either negative or within normal limits.  He last received IV Venofer on April 13, 2022.  Monitor closely throughout treatment.   MGUS: Resolved.  Most recent M spike was not observed.   Nephrotic range proteinuria: Case discussed with nephrology.  Patient never received 1000 mg of Rituxan.  Kidney function has improved, will hold treatment at this time.   Renal insufficiency: Patient's most recent creatinine is 1.41.  Treatment does not need to be dose reduced in the setting of renal insufficiency.  Patient expressed understanding and was in agreement with this plan. He also understands that He can call clinic at any time with any questions, concerns, or complaints.    Lloyd Huger, MD   06/08/2022 9:30 AM

## 2022-06-08 NOTE — Patient Instructions (Signed)
[facility]  Discharge Instructions: Thank you for choosing [facility] to provide your oncology and hematology care.  If you have a lab appointment with the [facility], please go directly to the [facility] and check in at the registration area.  Wear comfortable clothing and clothing appropriate for easy access to any Portacath or PICC line.   We strive to give you quality time with your provider. You may need to reschedule your appointment if you arrive late (15 or more minutes).  Arriving late affects you and other patients whose appointments are after yours.  Also, if you miss three or more appointments without notifying the office, you may be dismissed from the clinic at the provider's discretion.      For prescription refill requests, have your pharmacy contact our office and allow 72 hours for refills to be completed.    Today you received the following chemotherapy and/or immunotherapy agents Nivolumab and Ipilimumab.      To help prevent nausea and vomiting after your treatment, we encourage you to take your nausea medication as directed.  BELOW ARE SYMPTOMS THAT SHOULD BE REPORTED IMMEDIATELY: *FEVER GREATER THAN 100.4 F (38 C) OR HIGHER *CHILLS OR SWEATING *NAUSEA AND VOMITING THAT IS NOT CONTROLLED WITH YOUR NAUSEA MEDICATION *UNUSUAL SHORTNESS OF BREATH *UNUSUAL BRUISING OR BLEEDING *URINARY PROBLEMS (pain or burning when urinating, or frequent urination) *BOWEL PROBLEMS (unusual diarrhea, constipation, pain near the anus) TENDERNESS IN MOUTH AND THROAT WITH OR WITHOUT PRESENCE OF ULCERS (sore throat, sores in mouth, or a toothache) UNUSUAL RASH, SWELLING OR PAIN  UNUSUAL VAGINAL DISCHARGE OR ITCHING   Items with * indicate a potential emergency and should be followed up as soon as possible or go to the Emergency Department if any problems should occur.  Please show the CHEMOTHERAPY ALERT CARD or IMMUNOTHERAPY ALERT CARD  at check-in to the Emergency Department and triage nurse.  Should you have questions after your visit or need to cancel or reschedule your appointment, please contact [facility]  [phone] and follow the prompts.  Office hours are 8:00 a.m. to 4:30 p.m. Monday - Friday. Please note that voicemails left after 4:00 p.m. may not be returned until the following business day.  We are closed weekends and major holidays. You have access to a nurse at all times for urgent questions. Please call the main number to the clinic [phone] and follow the prompts.  For any non-urgent questions, you may also contact your provider using MyChart. We now offer e-Visits for anyone 44 and older to request care online for non-urgent symptoms. For details visit [email or web address].   Also download the MyChart app! Go to the app store, search "MyChart", open the app, select [facility], and log in with your MyChart username and password.   Nivolumab Injection What is this medication? NIVOLUMAB (nye VOL ue mab) treats some types of cancer. It works by helping your immune system slow or stop the spread of cancer cells. It is a monoclonal antibody. This medicine may be used for other purposes; ask your health care provider or pharmacist if you have questions. COMMON BRAND NAME(S): Opdivo What should I tell my care team before I take this medication? They need to know if you have any of these conditions: Allogeneic stem cell transplant (uses someone else's stem cells) Autoimmune diseases, such as Crohn disease, ulcerative colitis, lupus History of chest radiation Nervous system problems, such as Guillain-Barre syndrome  or myasthenia gravis Organ transplant An unusual or allergic reaction to nivolumab, other medications, foods, dyes, or preservatives Pregnant or trying to get pregnant Breast-feeding How should I use this medication? This medication is infused into a vein. It  is given in a hospital or clinic setting. A special MedGuide will be given to you before each treatment. Be sure to read this information carefully each time. Talk to your care team about the use of this medication in children. While it may be prescribed for children as young as 12 years for selected conditions, precautions do apply. Overdosage: If you think you have taken too much of this medicine contact a poison control center or emergency room at once. NOTE: This medicine is only for you. Do not share this medicine with others. What if I miss a dose? Keep appointments for follow-up doses. It is important not to miss your dose. Call your care team if you are unable to keep an appointment. What may interact with this medication? Interactions have not been studied. This list may not describe all possible interactions. Give your health care provider a list of all the medicines, herbs, non-prescription drugs, or dietary supplements you use. Also tell them if you smoke, drink alcohol, or use illegal drugs. Some items may interact with your medicine. What should I watch for while using this medication? Your condition will be monitored carefully while you are receiving this medication. You may need blood work while taking this medication. This medication may cause serious skin reactions. They can happen weeks to months after starting the medication. Contact your care team right away if you notice fevers or flu-like symptoms with a rash. The rash may be red or purple and then turn into blisters or peeling of the skin. You may also notice a red rash with swelling of the face, lips, or lymph nodes in your neck or under your arms. Tell your care team right away if you have any change in your eyesight. Talk to your care team if you are pregnant or think you might be pregnant. A negative pregnancy test is required before starting this medication. A reliable form of contraception is recommended while taking this  medication and for 5 months after the last dose. Talk to your care team about effective forms of contraception. Do not breast-feed while taking this medication and for 5 months after the last dose. What side effects may I notice from receiving this medication? Side effects that you should report to your care team as soon as possible: Allergic reactions--skin rash, itching, hives, swelling of the face, lips, tongue, or throat Dry cough, shortness of breath or trouble breathing Eye pain, redness, irritation, or discharge with blurry or decreased vision Heart muscle inflammation--unusual weakness or fatigue, shortness of breath, chest pain, fast or irregular heartbeat, dizziness, swelling of the ankles, feet, or hands Hormone gland problems--headache, sensitivity to light, unusual weakness or fatigue, dizziness, fast or irregular heartbeat, increased sensitivity to cold or heat, excessive sweating, constipation, hair loss, increased thirst or amount of urine, tremors or shaking, irritability Infusion reactions--chest pain, shortness of breath or trouble breathing, feeling faint or lightheaded Kidney injury (glomerulonephritis)--decrease in the amount of urine, red or dark brown urine, foamy or bubbly urine, swelling of the ankles, hands, or feet Liver injury--right upper belly pain, loss of appetite, nausea, light-colored stool, dark yellow or brown urine, yellowing skin or eyes, unusual weakness or fatigue Pain, tingling, or numbness in the hands or feet, muscle weakness, change in vision,  confusion or trouble speaking, loss of balance or coordination, trouble walking, seizures Rash, fever, and swollen lymph nodes Redness, blistering, peeling, or loosening of the skin, including inside the mouth Sudden or severe stomach pain, bloody diarrhea, fever, nausea, vomiting Side effects that usually do not require medical attention (report these to your care team if they continue or are bothersome): Bone,  joint, or muscle pain Diarrhea Fatigue Loss of appetite Nausea Skin rash This list may not describe all possible side effects. Call your doctor for medical advice about side effects. You may report side effects to FDA at 1-800-FDA-1088. Where should I keep my medication? This medication is given in a hospital or clinic. It will not be stored at home. NOTE: This sheet is a summary. It may not cover all possible information. If you have questions about this medicine, talk to your doctor, pharmacist, or health care provider.  2023 Elsevier/Gold Standard (2014-01-22 00:00:00)  Ipilimumab Injection What is this medication? IPILIMUMAB (IP i LIM ue mab) treats some types of cancer. It works by helping your immune system slow or stop the spread of cancer cells. It is a monoclonal antibody. This medicine may be used for other purposes; ask your health care provider or pharmacist if you have questions. COMMON BRAND NAME(S): YERVOY What should I tell my care team before I take this medication? They need to know if you have any of these conditions: Allogeneic stem cell transplant (uses someone else's stem cells) Autoimmune diseases, such as Crohn disease, ulcerative colitis, lupus Nervous system problems, such as Guillain-Barre syndrome or myasthenia gravis Organ transplant An unusual or allergic reaction to ipilimumab, other medications, foods, dyes, or preservatives Pregnant or trying to get pregnant Breast-feeding How should I use this medication? This medication is infused into a vein. It is given by your care team in a hospital or clinic setting. A special MedGuide will be given to you before each treatment. Be sure to read this information carefully each time. Talk to your care team about the use of this medication in children. While it may be prescribed for children as young as 12 years for selected conditions, precautions do apply. Overdosage: If you think you have taken too much of this  medicine contact a poison control center or emergency room at once. NOTE: This medicine is only for you. Do not share this medicine with others. What if I miss a dose? Keep appointments for follow-up doses. It is important not to miss your dose. Call your care team if you are unable to keep an appointment. What may interact with this medication? Interactions are not expected. This list may not describe all possible interactions. Give your health care provider a list of all the medicines, herbs, non-prescription drugs, or dietary supplements you use. Also tell them if you smoke, drink alcohol, or use illegal drugs. Some items may interact with your medicine. What should I watch for while using this medication? Your condition will be monitored carefully while you are receiving this medication. You may need blood work while taking this medication. This medication may cause serious skin reactions. They can happen weeks to months after starting the medication. Contact your care team right away if you notice fevers or flu-like symptoms with a rash. The rash may be red or purple and then turn into blisters or peeling of the skin. You may also notice a red rash with swelling of the face, lips, or lymph nodes in your neck or under your arms. Tell your care  team right away if you have any change in your eyesight. Talk to your care team if you may be pregnant. Serious birth defects can occur if you take this medication during pregnancy and for 3 months after the last dose. You will need a negative pregnancy test before starting this medication. Contraception is recommended while taking this medication and for 3 months after the last dose. Your care team can help you find the option that works for you. Do not breastfeed while taking this medication and for 3 months after the last dose. What side effects may I notice from receiving this medication? Side effects that you should report to your care team as soon as  possible: Allergic reactions--skin rash, itching, hives, swelling of the face, lips, tongue, or throat Dry cough, shortness of breath or trouble breathing Eye pain, redness, irritation, or discharge with blurry or decreased vision Heart muscle inflammation--unusual weakness or fatigue, shortness of breath, chest pain, fast or irregular heartbeat, dizziness, swelling of the ankles, feet, or hands Hormone gland problems--headache, sensitivity to light, unusual weakness or fatigue, dizziness, fast or irregular heartbeat, increased sensitivity to cold or heat, excessive sweating, constipation, hair loss, increased thirst or amount of urine, tremors or shaking, irritability Infusion reactions--chest pain, shortness of breath or trouble breathing, feeling faint or lightheaded Kidney injury (glomerulonephritis)--decrease in the amount of urine, red or dark brown urine, foamy or bubbly urine, swelling of the ankles, hands, or feet Liver injury--right upper belly pain, loss of appetite, nausea, light-colored stool, dark yellow or brown urine, yellowing skin or eyes, unusual weakness or fatigue Pain, tingling, or numbness in the hands or feet, muscle weakness, change in vision, confusion or trouble speaking, loss of balance or coordination, trouble walking, seizures Rash, fever, and swollen lymph nodes Redness, blistering, peeling, or loosening of the skin, including inside the mouth Sudden or severe stomach pain, bloody diarrhea, fever, nausea, vomiting Side effects that usually do not require medical attention (report to your care team if they continue or are bothersome): Bone, joint, or muscle pain Diarrhea Fatigue Loss of appetite Nausea Skin rash This list may not describe all possible side effects. Call your doctor for medical advice about side effects. You may report side effects to FDA at 1-800-FDA-1088. Where should I keep my medication? This medication is given in a hospital or clinic. It will  not be stored at home. NOTE: This sheet is a summary. It may not cover all possible information. If you have questions about this medicine, talk to your doctor, pharmacist, or health care provider.  2023 Elsevier/Gold Standard (2021-07-15 00:00:00)

## 2022-06-09 ENCOUNTER — Telehealth: Payer: Self-pay

## 2022-06-09 ENCOUNTER — Encounter: Payer: Self-pay | Admitting: Oncology

## 2022-06-09 LAB — IGG, IGA, IGM
IgA: 190 mg/dL (ref 61–437)
IgG (Immunoglobin G), Serum: 1562 mg/dL (ref 603–1613)
IgM (Immunoglobulin M), Srm: 93 mg/dL (ref 15–143)

## 2022-06-09 LAB — KAPPA/LAMBDA LIGHT CHAINS
Kappa free light chain: 76.7 mg/L — ABNORMAL HIGH (ref 3.3–19.4)
Kappa, lambda light chain ratio: 2.86 — ABNORMAL HIGH (ref 0.26–1.65)
Lambda free light chains: 26.8 mg/L — ABNORMAL HIGH (ref 5.7–26.3)

## 2022-06-09 LAB — T4: T4, Total: 5.6 ug/dL (ref 4.5–12.0)

## 2022-06-09 NOTE — Telephone Encounter (Signed)
Telephone call to patient for follow up after receiving first infusion.   Patient states infusion went great.  States eating good and drinking plenty of fluids but last night his legs were aching and felt "restless".   Explained to patient that Benadryl can cause "restless leg syndrome".  Patient denies and pain or restlessness in legs today.  Denies any nausea or vomiting.  Encouraged patient to call for any concerns or questions.

## 2022-06-10 ENCOUNTER — Encounter: Payer: Self-pay | Admitting: Oncology

## 2022-06-10 DIAGNOSIS — E1165 Type 2 diabetes mellitus with hyperglycemia: Secondary | ICD-10-CM | POA: Diagnosis not present

## 2022-06-10 LAB — PROTEIN ELECTROPHORESIS, SERUM
A/G Ratio: 1 (ref 0.7–1.7)
Albumin ELP: 3.1 g/dL (ref 2.9–4.4)
Alpha-1-Globulin: 0.2 g/dL (ref 0.0–0.4)
Alpha-2-Globulin: 0.7 g/dL (ref 0.4–1.0)
Beta Globulin: 0.9 g/dL (ref 0.7–1.3)
Gamma Globulin: 1.5 g/dL (ref 0.4–1.8)
Globulin, Total: 3.2 g/dL (ref 2.2–3.9)
Total Protein ELP: 6.3 g/dL (ref 6.0–8.5)

## 2022-06-10 NOTE — Research (Signed)
S2013 - Immune Checkpoint Inhibitor Toxicity (I-CHECKIT): A Prospective Observational Study   Late entry for 06/08/2022: Patient in to the cancer center for his first infusion treatment today with Nivolumab and Ipilimumab. He denies any complaints today. States he is just getting started as his pre-medications were infusing. Patient aware he was successfully enrolled in the clinical trial and research nurse will be visiting with him at each treatment to follow for any untoward side effects related to his immunotherapy treatment. Patient did not opt to participate in the research specimen portion of the protocol, so no research labs will be required. Patient is in agreement with all of these plans.  Jeral Fruit, RN 06/10/22 10:40 AM

## 2022-06-11 DIAGNOSIS — I2510 Atherosclerotic heart disease of native coronary artery without angina pectoris: Secondary | ICD-10-CM | POA: Diagnosis not present

## 2022-06-11 DIAGNOSIS — R9439 Abnormal result of other cardiovascular function study: Secondary | ICD-10-CM | POA: Diagnosis not present

## 2022-06-11 DIAGNOSIS — Z955 Presence of coronary angioplasty implant and graft: Secondary | ICD-10-CM | POA: Diagnosis not present

## 2022-06-11 DIAGNOSIS — I519 Heart disease, unspecified: Secondary | ICD-10-CM | POA: Diagnosis not present

## 2022-06-11 DIAGNOSIS — N1831 Chronic kidney disease, stage 3a: Secondary | ICD-10-CM | POA: Diagnosis not present

## 2022-06-11 DIAGNOSIS — Z7982 Long term (current) use of aspirin: Secondary | ICD-10-CM | POA: Diagnosis not present

## 2022-06-11 DIAGNOSIS — I255 Ischemic cardiomyopathy: Secondary | ICD-10-CM | POA: Diagnosis not present

## 2022-06-11 DIAGNOSIS — E785 Hyperlipidemia, unspecified: Secondary | ICD-10-CM | POA: Diagnosis not present

## 2022-06-11 DIAGNOSIS — E119 Type 2 diabetes mellitus without complications: Secondary | ICD-10-CM | POA: Diagnosis not present

## 2022-06-15 ENCOUNTER — Ambulatory Visit: Payer: Medicare HMO | Admitting: Oncology

## 2022-06-15 ENCOUNTER — Other Ambulatory Visit: Payer: Medicare HMO

## 2022-06-16 ENCOUNTER — Other Ambulatory Visit: Payer: Self-pay

## 2022-06-16 ENCOUNTER — Ambulatory Visit
Admission: RE | Admit: 2022-06-16 | Discharge: 2022-06-16 | Disposition: A | Discharge status: Home / Self Care | Payer: Medicare HMO | Source: Ambulatory Visit | Attending: Oncology | Admitting: Oncology

## 2022-06-16 DIAGNOSIS — C439 Malignant melanoma of skin, unspecified: Secondary | ICD-10-CM | POA: Diagnosis not present

## 2022-06-16 DIAGNOSIS — C434 Malignant melanoma of scalp and neck: Secondary | ICD-10-CM | POA: Insufficient documentation

## 2022-06-16 MED ORDER — GADOBUTROL 1 MMOL/ML IV SOLN
10.0000 mL | Freq: Once | INTRAVENOUS | Status: AC | PRN
  Administered 2022-06-16: 10 mL via INTRAVENOUS

## 2022-06-17 ENCOUNTER — Other Ambulatory Visit: Payer: Medicare HMO

## 2022-06-17 ENCOUNTER — Ambulatory Visit: Payer: Medicare HMO | Admitting: Oncology

## 2022-06-18 ENCOUNTER — Ambulatory Visit (INDEPENDENT_AMBULATORY_CARE_PROVIDER_SITE_OTHER): Payer: Medicare HMO | Admitting: Vascular Surgery

## 2022-06-22 ENCOUNTER — Inpatient Hospital Stay: Payer: Medicare HMO | Attending: Oncology

## 2022-06-22 ENCOUNTER — Inpatient Hospital Stay (HOSPITAL_BASED_OUTPATIENT_CLINIC_OR_DEPARTMENT_OTHER): Payer: Medicare HMO | Admitting: Oncology

## 2022-06-22 VITALS — BP 138/59 | HR 86 | Temp 98.6°F | Resp 18 | Ht 72.0 in | Wt 281.0 lb

## 2022-06-22 DIAGNOSIS — C434 Malignant melanoma of scalp and neck: Secondary | ICD-10-CM | POA: Diagnosis not present

## 2022-06-22 DIAGNOSIS — C7800 Secondary malignant neoplasm of unspecified lung: Secondary | ICD-10-CM | POA: Diagnosis not present

## 2022-06-22 DIAGNOSIS — Z5112 Encounter for antineoplastic immunotherapy: Secondary | ICD-10-CM | POA: Diagnosis not present

## 2022-06-22 DIAGNOSIS — R197 Diarrhea, unspecified: Secondary | ICD-10-CM | POA: Insufficient documentation

## 2022-06-22 DIAGNOSIS — R808 Other proteinuria: Secondary | ICD-10-CM | POA: Insufficient documentation

## 2022-06-22 DIAGNOSIS — D509 Iron deficiency anemia, unspecified: Secondary | ICD-10-CM | POA: Insufficient documentation

## 2022-06-22 DIAGNOSIS — D472 Monoclonal gammopathy: Secondary | ICD-10-CM

## 2022-06-22 DIAGNOSIS — Z79899 Other long term (current) drug therapy: Secondary | ICD-10-CM | POA: Diagnosis not present

## 2022-06-22 LAB — BASIC METABOLIC PANEL
Anion gap: 7 (ref 5–15)
BUN: 53 mg/dL — ABNORMAL HIGH (ref 8–23)
CO2: 24 mmol/L (ref 22–32)
Calcium: 7.9 mg/dL — ABNORMAL LOW (ref 8.9–10.3)
Chloride: 103 mmol/L (ref 98–111)
Creatinine, Ser: 1.47 mg/dL — ABNORMAL HIGH (ref 0.61–1.24)
GFR, Estimated: 50 mL/min — ABNORMAL LOW (ref >60)
Glucose, Bld: 286 mg/dL — ABNORMAL HIGH (ref 70–99)
Potassium: 3.9 mmol/L (ref 3.5–5.1)
Sodium: 134 mmol/L — ABNORMAL LOW (ref 135–145)

## 2022-06-22 LAB — CBC
HCT: 27.6 % — ABNORMAL LOW (ref 39.0–52.0)
Hemoglobin: 9.1 g/dL — ABNORMAL LOW (ref 13.0–17.0)
MCH: 30 pg (ref 26.0–34.0)
MCHC: 33 g/dL (ref 30.0–36.0)
MCV: 91.1 fL (ref 80.0–100.0)
Platelets: 221 10*3/uL (ref 150–400)
RBC: 3.03 MIL/uL — ABNORMAL LOW (ref 4.22–5.81)
RDW: 15 % (ref 11.5–15.5)
WBC: 5.9 10*3/uL (ref 4.0–10.5)
nRBC: 0 % (ref 0.0–0.2)

## 2022-06-22 LAB — IRON AND TIBC
Iron: 59 ug/dL (ref 45–182)
Saturation Ratios: 27 % (ref 17.9–39.5)
TIBC: 223 ug/dL — ABNORMAL LOW (ref 250–450)
UIBC: 164 ug/dL

## 2022-06-22 LAB — FERRITIN: Ferritin: 210 ng/mL (ref 24–336)

## 2022-06-22 NOTE — Progress Notes (Signed)
Does he need to keep taking iron supplements? Would like something for diarrhea. Can not take imodium. Having low energy. Son worried about fluid build up in chest.

## 2022-06-22 NOTE — Progress Notes (Signed)
[facility]  [phone] [phone]  ID: [name] OB: 10-21-1950  MR#: [id number]  CSN#:[id number]  Patient Care Team: Dorothey Baseman, MD as PCP - General (Family Medicine) Stanton Kidney, MD as Consulting Physician (Gastroenterology) Jeralyn Ruths, MD as Consulting Physician (Oncology) Kemper Durie, RN as [facility] Care Management  CHIEF COMPLAINT: Stage IV melanoma with lung metastasis.  INTERVAL HISTORY: Patient returns to clinic today for further evaluation and to assess his toleration of cycle 1 of ipilimumab and nivolumab.  He noticed increased weakness and fatigue for several days after his treatment, but otherwise tolerated well.  He had some mild diarrhea, but this has improved.  He has no neurologic complaints.  He denies any fevers.  He has a good appetite and denies weight loss.  He has no chest pain, cough, or hemoptysis.  He denies any nausea, vomiting, or constipation.  He has no melena or hematochezia.  He has no urinary complaints.  Patient offers no further specific complaints today.  REVIEW OF SYSTEMS:   Review of Systems  Constitutional:  Positive for malaise/fatigue. Negative for fever and weight loss.  Respiratory:  Positive for shortness of breath. Negative for cough and hemoptysis.   Cardiovascular: Negative.  Negative for chest pain and leg swelling.  Gastrointestinal:  Positive for diarrhea. Negative for abdominal pain, blood in stool and melena.  Genitourinary: Negative.  Negative for hematuria.  Musculoskeletal: Negative.  Negative for back pain.  Skin: Negative.  Negative for rash.  Neurological:  Positive for weakness. Negative for dizziness, focal weakness and headaches.  Psychiatric/Behavioral: Negative.  The patient is not nervous/anxious.     As per HPI. Otherwise, a complete review of systems is negative.  PAST MEDICAL HISTORY: Past Medical History:  Diagnosis Date   Aortic  atherosclerosis (HCC)    Arthritis    Bilateral carotid artery stenosis    a.) carotid doppler 05/07/2022: 1-39% BICA   CHF (congestive heart failure), NYHA class II, acute on chronic, systolic (HCC)    a.) TTE 01/09/2015: EF 35%, LVH, inf HK, LAE, triv TR/PR, mild MR, G1DD; b.) TTE 04/09/2017: EF 45%, mod LVH, post HK, triv MR/TR; c.) TTE 07/26/2018: EF 40%, LVH, inf/post HK, LAE, RVE, triv-mild panval regurg, G1DD; d.) TTE 06/01/2019: EF 40%, LVH, api/inf HK, BAE, triv MR/TR/PR, G1DD; e.) TTE 05/20/2021: EF 45%, LVH, LAE, triv MR/TR/PR; f.) TTE 04/12/2022: EF 45-50%, glob HK, BAE, triv MR, G2DD   Chronic kidney disease (CKD), stage II (mild)    Chronic membranous glomerulonephritis    Coronary artery disease    a.) LHC 05/02/1999: 25/25/75% mLAD --> 2.5 x 18 mm Guidant/ACE OTW Tetra   DDD (degenerative disc disease), lumbar    Diabetic neuropathy (HCC)    Dysplastic nevus 08/26/2020   L flank, moderate atypia   Dyspnea on exertion    Elevated troponin    History of bilateral cataract extraction    Hyperlipidemia    Hypertension    Iron deficiency anemia    Ischemic cardiomyopathy    Lymphedema    Melanoma (HCC) 08/20/2015   Right neck. Superficial spreading, arising in nevus. Tumor thickness 1.6mm, Anatomic level IV   Melanoma in situ (HCC) 05/22/2020   R lateral neck ant to scar, exc 05/22/20   Osteomyelitis of great toe of left foot (HCC)    Pulmonary mass 04/23/2022   a.)  CT chest 04/23/2022: LLL mass suspicious for bronchogenic neoplasm with associated peripheral changes; b.)  PET CT 05/12/2022:  Hypermetabolic LEFT lower lobe mass measuring 8.5 x 4.6 cm (SUV max 23) disease; second foci of metabolic activity slightly more inferior with similar SUV intensity.   RBBB (right bundle branch block)    Recurrent left pleural effusion    Skin cancer    removed l arm   Sleep apnea    a.) s/p uvulectomy 1990   Spinal stenosis of lumbar region with radiculopathy    Swelling of both  lower extremities    T2DM (type 2 diabetes mellitus) (HCC)    a.) uses Dexcom G6 CGM   Venous ulcer of ankle (HCC)     PAST SURGICAL HISTORY: Past Surgical History:  Procedure Laterality Date   CATARACT EXTRACTION W/PHACO Right 05/03/2019   Procedure: CATARACT EXTRACTION PHACO AND INTRAOCULAR LENS PLACEMENT (IOC) RIGHT DIABETIC 6.44 00:58.4 11.0%;  Surgeon: Lockie Mola, MD;  Location: [facility];  Service: Ophthalmology;  Laterality: Right;  DIABETIC   CATARACT EXTRACTION W/PHACO Left 06/28/2019   Procedure: CATARACT EXTRACTION PHACO AND INTRAOCULAR LENS PLACEMENT (IOC) LEFT DIABETIC 7.99  01:13.4  10.9% ;  Surgeon: Lockie Mola, MD;  Location: [facility];  Service: Ophthalmology;  Laterality: Left;  Diabetic - insulin and oral meds   CHOLECYSTECTOMY  1985   CORONARY ANGIOPLASTY WITH STENT PLACEMENT Left 05/02/1999   LUMBAR LAMINECTOMY/DECOMPRESSION MICRODISCECTOMY  04/13/2012   Procedure: LUMBAR LAMINECTOMY/DECOMPRESSION MICRODISCECTOMY 1 LEVEL;  Surgeon: Javier Docker, MD;  Location: [facility];  Service: Orthopedics;  Laterality: Bilateral;  L4-L5   TOE AMPUTATION Left    big toe   TOTAL KNEE ARTHROPLASTY Right 2007   UVULECTOMY  11/14/1988   VIDEO BRONCHOSCOPY WITH ENDOBRONCHIAL ULTRASOUND N/A 05/15/2022   Procedure: VIDEO BRONCHOSCOPY WITH ENDOBRONCHIAL ULTRASOUND;  Surgeon: Vida Rigger, MD;  Location: [facility];  Service: Thoracic;  Laterality: N/A;    FAMILY HISTORY: No family history on file.  ADVANCED DIRECTIVES (Y/N):  N  HEALTH MAINTENANCE: Social History   Tobacco Use   Smoking status: Never   Smokeless tobacco: Never  Vaping Use   Vaping Use: Never used  Substance Use Topics   Alcohol use: Yes    Comment: rare   Drug use: No     Colonoscopy:  PAP:  Bone density:  Lipid panel:  Allergies  Allergen Reactions   Carvedilol Itching, Other (See Comments) and Swelling    Pain, itching and swelling beneath scrotum to anus.    Metoprolol Other (See Comments)    Cant remember  Cant remember  Cant remember  Cant remember   Toprol Xl [Metoprolol Tartrate] Other (See Comments)    Cant remember   Buprenorphine Itching and Rash   Buprenorphine Hcl Itching and Rash   Morphine Itching and Other (See Comments)   Morphine And Related Itching, Rash and Other (See Comments)   Triamcinolone Rash   Triamcinolone Acetonide Rash    Current Outpatient Medications  Medication Sig Dispense Refill   aspirin EC 81 MG tablet Take 162 mg by mouth daily. Swallow whole.     atorvastatin (LIPITOR) 80 MG tablet Take 80 mg by mouth at bedtime.     Continuous Blood Gluc Receiver (DEXCOM G6 RECEIVER) DEVI Use to monitor blood sugar. NDC [phone]     Continuous Blood Gluc Sensor (DEXCOM G6 SENSOR) MISC Use to monitor blood sugar.  Replace every 10 days. NDC [phone].     Continuous Blood Gluc Transmit (DEXCOM G6 TRANSMITTER) MISC Use to monitor blood sugar.  Replace every 3 months. NDC [phone].     ferrous  sulfate (FEROSUL) 325 (65 FE) MG tablet Take 325 mg by mouth 2 (two) times daily with a meal.     insulin aspart (NOVOLOG) 100 UNIT/ML injection Inject into the skin 3 (three) times daily before meals.     insulin glargine (LANTUS) 100 UNIT/ML injection Inject 34 Units into the skin at bedtime.     Insulin Pen Needle (COMFORT EZ PEN NEEDLES) 32G X 8 MM MISC See admin instructions.     NOVOLOG FLEXPEN 100 UNIT/ML FlexPen INJ UP TO 50 UNI D [place] IN MULTIPLE INJECTIONS B MEALS UTD     prochlorperazine (COMPAZINE) 10 MG tablet Take 1 tablet (10 mg total) by mouth every 6 (six) hours as needed for nausea or vomiting. 30 tablet 1   spironolactone (ALDACTONE) 25 MG tablet Take 50 mg by mouth every morning.     tacrolimus (PROGRAF) 1 MG capsule Take 1 mg by mouth 2 (two) times daily.     tamsulosin (FLOMAX) 0.4 MG CAPS capsule Take 1 capsule (0.4 mg total) by mouth daily after supper. (Patient taking differently: Take 0.4 mg by  mouth daily after breakfast.) 30 capsule 11   torsemide (DEMADEX) 100 MG tablet Take 100 mg by mouth every morning.     Vitamin D, Ergocalciferol, (DRISDOL) 1.25 MG (50000 UNIT) CAPS capsule Take 1 capsule (50,000 Units total) by mouth every 7 (seven) days. (Patient taking differently: Take 50,000 Units by mouth every 7 (seven) days. Fridays) 12 capsule 0   No current facility-administered medications for this visit.    OBJECTIVE: Vitals:   06/22/22 1041  BP: (!) 138/59  Pulse: 86  Resp: 18  Temp: 98.6 F (37 C)  SpO2: 100%     Body mass index is 38.11 kg/m.    ECOG FS:0 - Asymptomatic  General: Well-developed, well-nourished, no acute distress. Eyes: Pink conjunctiva, anicteric sclera. HEENT: Normocephalic, moist mucous membranes. Lungs: No audible wheezing or coughing. Heart: Regular rate and rhythm. Abdomen: Soft, nontender, no obvious distention. Musculoskeletal: No edema, cyanosis, or clubbing. Neuro: Alert, answering all questions appropriately. Cranial nerves grossly intact. Skin: No rashes or petechiae noted. Psych: Normal affect.  LAB RESULTS:  Lab Results  Component Value Date   NA 134 (L) 06/22/2022   K 3.9 06/22/2022   CL 103 06/22/2022   CO2 24 06/22/2022   GLUCOSE 286 (H) 06/22/2022   BUN 53 (H) 06/22/2022   CREATININE 1.47 (H) 06/22/2022   CALCIUM 7.9 (L) 06/22/2022   PROT 6.8 06/08/2022   ALBUMIN 3.2 (L) 06/08/2022   AST 20 06/08/2022   ALT 19 06/08/2022   ALKPHOS 72 06/08/2022   BILITOT 1.1 06/08/2022   GFRNONAA 50 (L) 06/22/2022   GFRAA >60 10/15/2019    Lab Results  Component Value Date   WBC 5.9 06/22/2022   NEUTROABS 6.8 06/08/2022   HGB 9.1 (L) 06/22/2022   HCT 27.6 (L) 06/22/2022   MCV 91.1 06/22/2022   PLT 221 06/22/2022   Lab Results  Component Value Date   IRON 59 06/22/2022   TIBC 223 (L) 06/22/2022   IRONPCTSAT 27 06/22/2022   Lab Results  Component Value Date   FERRITIN 210 06/22/2022     STUDIES: MR Brain W Wo  Contrast  Result Date: 06/17/2022 CLINICAL DATA:  Staging for metastatic melanoma. EXAM: MRI HEAD WITHOUT AND WITH CONTRAST TECHNIQUE: Multiplanar, multiecho pulse sequences of the brain and surrounding structures were obtained without and with intravenous contrast. CONTRAST:  13mL GADAVIST GADOBUTROL 1 MMOL/ML IV SOLN COMPARISON:  None FINDINGS:  Brain: No acute infarct, hemorrhage, or mass lesion is present. No significant white matter lesions are present. The ventricles are of normal size. No significant extraaxial fluid collection is present. The brainstem and cerebellum are within normal limits. The internal auditory canals are within normal limits. Postcontrast images demonstrate no pathologic enhancement. Vascular: Flow is present in the major intracranial arteries. Skull and upper cervical spine: The craniocervical junction is normal. Upper cervical spine is within normal limits. Marrow signal is unremarkable. Sinuses/Orbits: Mild mucosal thickening is present in the inferior left maxillary sinus. The paranasal sinuses and mastoid air cells are otherwise clear. Bilateral lens replacements are noted. Globes and orbits are otherwise unremarkable. IMPRESSION: Negative MRI of the brain. No evidence for metastatic disease to the brain. Electronically Signed   By: Marin Robertshristopher  Mattern M.D.   On: 06/17/2022 17:39    ASSESSMENT: IStage IV melanoma with lung metastasis.  PLAN:   Melanoma: Patient had Mohs procedure on his right neck to remove a malignant melanoma in approximately 2017 by report was T2a, N0, M0 lesion or stage Ib.  PET scan results from May 12, 2022 reviewed independently and reported as above with hypermetabolic left lower lobe lung mass.  Biopsy consistent with malignant melanoma.  Given the likely malignant pleural effusion, patient is not a surgical candidate therefore we will proceed with combination immunotherapy using ipilimumab and nivolumab every 3 weeks x 4 followed by high-dose  nivolumab every 4 weeks.  Patient has declined port placement.  Patient tolerated cycle 1 of treatment.  Return to clinic in 1 week for further evaluation and consideration of cycle 2.    Iron deficiency anemia: Hemoglobin has trended down to 9.1.  Iron stores are now within normal limits.  Previously, all of his other laboratory work other is either negative or within normal limits.  He last received IV Venofer on April 13, 2022.  Monitor closely throughout treatment.   MGUS: Resolved.  Most recent M spike was not observed.   Nephrotic range proteinuria: Case discussed with nephrology.  Patient never received 1000 mg of Rituxan.  Kidney function has improved, will hold treatment at this time.   Renal insufficiency: Creatinine has slightly increased to 1.47.  Monitor.  Treatment does not need to be dose reduced in the setting of renal insufficiency. Blood glucose: Patient currently has poor blood glucose control, monitor.  Follow-up with primary care as needed. Diarrhea: OTC Imodium as needed.  Patient expressed understanding and was in agreement with this plan. He also understands that He can call clinic at any time with any questions, concerns, or complaints.    Jeralyn Ruthsimothy J Francis Doenges, MD   06/22/2022 2:33 PM

## 2022-06-23 DIAGNOSIS — E119 Type 2 diabetes mellitus without complications: Secondary | ICD-10-CM | POA: Diagnosis not present

## 2022-06-23 LAB — KAPPA/LAMBDA LIGHT CHAINS
Kappa free light chain: 101.4 mg/L — ABNORMAL HIGH (ref 3.3–19.4)
Kappa, lambda light chain ratio: 1.96 — ABNORMAL HIGH (ref 0.26–1.65)
Lambda free light chains: 51.7 mg/L — ABNORMAL HIGH (ref 5.7–26.3)

## 2022-06-23 LAB — IGG, IGA, IGM
IgA: 203 mg/dL (ref 61–437)
IgG (Immunoglobin G), Serum: 1451 mg/dL (ref 603–1613)
IgM (Immunoglobulin M), Srm: 103 mg/dL (ref 15–143)

## 2022-06-24 ENCOUNTER — Ambulatory Visit (INDEPENDENT_AMBULATORY_CARE_PROVIDER_SITE_OTHER): Payer: Medicare HMO | Admitting: Dermatology

## 2022-06-24 VITALS — BP 144/57 | HR 89

## 2022-06-24 DIAGNOSIS — Z1283 Encounter for screening for malignant neoplasm of skin: Secondary | ICD-10-CM

## 2022-06-24 DIAGNOSIS — D229 Melanocytic nevi, unspecified: Secondary | ICD-10-CM

## 2022-06-24 DIAGNOSIS — L814 Other melanin hyperpigmentation: Secondary | ICD-10-CM

## 2022-06-24 DIAGNOSIS — Z85820 Personal history of malignant melanoma of skin: Secondary | ICD-10-CM | POA: Diagnosis not present

## 2022-06-24 DIAGNOSIS — Z86006 Personal history of melanoma in-situ: Secondary | ICD-10-CM | POA: Diagnosis not present

## 2022-06-24 DIAGNOSIS — L820 Inflamed seborrheic keratosis: Secondary | ICD-10-CM

## 2022-06-24 DIAGNOSIS — L578 Other skin changes due to chronic exposure to nonionizing radiation: Secondary | ICD-10-CM | POA: Diagnosis not present

## 2022-06-24 DIAGNOSIS — L821 Other seborrheic keratosis: Secondary | ICD-10-CM | POA: Diagnosis not present

## 2022-06-24 NOTE — Patient Instructions (Signed)

## 2022-06-24 NOTE — Progress Notes (Signed)
Follow-Up Visit   Subjective  [name] is a 72 y.o. male who presents for the following: Spots on the face, neck, and scalp. Itchy and patient picks at.  The patient has spots, moles and lesions to be evaluated, some may be new or changing and the patient has concerns that these could be cancer.   Patient has a history of invasive melanoma 1.15 mm level IV of the right neck (2017) with neg SLN biopsy and melanoma in situ of the right lateral neck ant to scar (2022). Patient states that he has 2 areas of melanoma in the lungs and is undergoing immunotherapy treatment currently. It presented as fluid on the left lung.  Patient accompanied by son.   The following portions of the chart were reviewed this encounter and updated as appropriate: medications, allergies, medical history  Review of Systems:  No other skin or systemic complaints except as noted in HPI or Assessment and Plan.  Objective  Well appearing patient in no apparent distress; mood and affect are within normal limits.  A focused examination was performed of the following areas: All skin waist up.  Relevant physical exam findings are noted in the Assessment and Plan.    Assessment & Plan  ACTINIC DAMAGE - chronic, secondary to cumulative UV radiation exposure/sun exposure over time - diffuse scaly erythematous macules with underlying dyspigmentation - Recommend daily broad spectrum sunscreen SPF 30+ to sun-exposed areas, reapply every 2 hours as needed.  - Recommend staying in the shade or wearing long sleeves, sun glasses (UVA+UVB protection) and wide brim hats (4-inch brim around the entire circumference of the hat). - Call for new or changing lesions.  Skin cancer screening performed today.  LENTIGINES Exam: scattered tan macules Due to sun exposure Treatment Plan: Benign-appearing, observe. Recommend daily broad spectrum sunscreen SPF 30+ to sun-exposed areas, reapply every 2 hours as needed.  Call for  any changes  SEBORRHEIC KERATOSIS - Stuck-on, waxy, tan-brown papules and/or plaques  - Benign-appearing - Discussed benign etiology and prognosis. - Observe - Call for any changes  HISTORY OF MELANOMA  History of melanoma of the right neck (1.15 mm, Level IV, neg. SLN 2017) now metastatic to lungs, and melanoma in situ of the right lateral neck ant to scar (2022) - No evidence of recurrence today of the skin  - No lymphadenopathy - Patient is currently undergoing immunotherapy treatment for melanoma of the lungs.  - Recommend regular full body skin exams - Recommend daily broad spectrum sunscreen SPF 30+ to sun-exposed areas, reapply every 2 hours as needed.  - Call if any new or changing lesions are noted between office visits  MELANOCYTIC NEVI Benign appearing on exam today. Recommend observation. Call clinic for new or changing moles. Recommend daily use of broad spectrum spf 30+ sunscreen to sun-exposed areas.  Exam Tan-brown and/or pink-flesh-colored symmetric macules and papules  INFLAMED SEBORRHEIC KERATOSIS Exam: Erythematous keratotic or waxy stuck-on papule or plaque.  Symptomatic, irritating, patient would like treated.  Benign-appearing.  Call clinic for new or changing lesions.   Prior to procedure, discussed risks of blister formation, small wound, skin dyspigmentation, or rare scar following treatment. Recommend Vaseline ointment to treated areas while healing.  Destruction Procedure Note Destruction method: cryotherapy   Informed consent: discussed and consent obtained   Lesion destroyed using liquid nitrogen: Yes   Outcome: patient tolerated procedure well with no complications   Post-procedure details: wound care instructions given   Locations: R lateral sideburn x 1,  L neck x 3, L postauricular neck adj to scar x 1, L forearm x 2 adjacent, R temple scalp x 1 # of Lesions Treated: 8  Recheck on f/up  Return in about 2 months (around 08/24/2022) for ISKs,  Hx melanoma.  ICherlyn Labella, CMA, am acting as scribe for Willeen Niece, MD .   Documentation: I have reviewed the above documentation for accuracy and completeness, and I agree with the above.  Willeen Niece, MD

## 2022-06-25 ENCOUNTER — Encounter (INDEPENDENT_AMBULATORY_CARE_PROVIDER_SITE_OTHER): Payer: Self-pay

## 2022-06-25 ENCOUNTER — Ambulatory Visit (INDEPENDENT_AMBULATORY_CARE_PROVIDER_SITE_OTHER): Payer: Medicare HMO | Admitting: Vascular Surgery

## 2022-06-25 LAB — PROTEIN ELECTROPHORESIS, SERUM
A/G Ratio: 0.8 (ref 0.7–1.7)
Albumin ELP: 2.5 g/dL — ABNORMAL LOW (ref 2.9–4.4)
Alpha-1-Globulin: 0.3 g/dL (ref 0.0–0.4)
Alpha-2-Globulin: 0.7 g/dL (ref 0.4–1.0)
Beta Globulin: 0.8 g/dL (ref 0.7–1.3)
Gamma Globulin: 1.4 g/dL (ref 0.4–1.8)
Globulin, Total: 3.2 g/dL (ref 2.2–3.9)
Total Protein ELP: 5.7 g/dL — ABNORMAL LOW (ref 6.0–8.5)

## 2022-06-28 ENCOUNTER — Observation Stay: Payer: Medicare HMO

## 2022-06-28 ENCOUNTER — Other Ambulatory Visit: Payer: Self-pay

## 2022-06-28 ENCOUNTER — Emergency Department: Payer: Medicare HMO

## 2022-06-28 ENCOUNTER — Observation Stay
Admission: EM | Admit: 2022-06-28 | Discharge: 2022-06-30 | Disposition: A | Discharge status: Home / Self Care | Payer: Medicare HMO | Attending: Hospitalist | Admitting: Hospitalist

## 2022-06-28 ENCOUNTER — Telehealth: Payer: Self-pay | Admitting: Internal Medicine

## 2022-06-28 DIAGNOSIS — J90 Pleural effusion, not elsewhere classified: Secondary | ICD-10-CM | POA: Diagnosis not present

## 2022-06-28 DIAGNOSIS — Z79899 Other long term (current) drug therapy: Secondary | ICD-10-CM | POA: Diagnosis not present

## 2022-06-28 DIAGNOSIS — R531 Weakness: Secondary | ICD-10-CM | POA: Insufficient documentation

## 2022-06-28 DIAGNOSIS — E669 Obesity, unspecified: Secondary | ICD-10-CM | POA: Diagnosis not present

## 2022-06-28 DIAGNOSIS — Z794 Long term (current) use of insulin: Secondary | ICD-10-CM | POA: Diagnosis not present

## 2022-06-28 DIAGNOSIS — I130 Hypertensive heart and chronic kidney disease with heart failure and stage 1 through stage 4 chronic kidney disease, or unspecified chronic kidney disease: Secondary | ICD-10-CM | POA: Diagnosis not present

## 2022-06-28 DIAGNOSIS — C439 Malignant melanoma of skin, unspecified: Secondary | ICD-10-CM

## 2022-06-28 DIAGNOSIS — Z6838 Body mass index (BMI) 38.0-38.9, adult: Secondary | ICD-10-CM | POA: Insufficient documentation

## 2022-06-28 DIAGNOSIS — Z85118 Personal history of other malignant neoplasm of bronchus and lung: Secondary | ICD-10-CM | POA: Diagnosis not present

## 2022-06-28 DIAGNOSIS — D500 Iron deficiency anemia secondary to blood loss (chronic): Secondary | ICD-10-CM | POA: Insufficient documentation

## 2022-06-28 DIAGNOSIS — R0600 Dyspnea, unspecified: Secondary | ICD-10-CM | POA: Diagnosis present

## 2022-06-28 DIAGNOSIS — E1140 Type 2 diabetes mellitus with diabetic neuropathy, unspecified: Secondary | ICD-10-CM | POA: Diagnosis not present

## 2022-06-28 DIAGNOSIS — N1831 Chronic kidney disease, stage 3a: Secondary | ICD-10-CM | POA: Diagnosis not present

## 2022-06-28 DIAGNOSIS — Z1152 Encounter for screening for COVID-19: Secondary | ICD-10-CM | POA: Diagnosis not present

## 2022-06-28 DIAGNOSIS — I2510 Atherosclerotic heart disease of native coronary artery without angina pectoris: Secondary | ICD-10-CM | POA: Insufficient documentation

## 2022-06-28 DIAGNOSIS — I4891 Unspecified atrial fibrillation: Secondary | ICD-10-CM | POA: Diagnosis not present

## 2022-06-28 DIAGNOSIS — I509 Heart failure, unspecified: Secondary | ICD-10-CM | POA: Insufficient documentation

## 2022-06-28 DIAGNOSIS — R5383 Other fatigue: Secondary | ICD-10-CM | POA: Diagnosis not present

## 2022-06-28 DIAGNOSIS — R945 Abnormal results of liver function studies: Secondary | ICD-10-CM | POA: Diagnosis not present

## 2022-06-28 DIAGNOSIS — Z85828 Personal history of other malignant neoplasm of skin: Secondary | ICD-10-CM | POA: Insufficient documentation

## 2022-06-28 DIAGNOSIS — R7989 Other specified abnormal findings of blood chemistry: Secondary | ICD-10-CM | POA: Diagnosis not present

## 2022-06-28 DIAGNOSIS — E1122 Type 2 diabetes mellitus with diabetic chronic kidney disease: Secondary | ICD-10-CM | POA: Insufficient documentation

## 2022-06-28 DIAGNOSIS — R0602 Shortness of breath: Secondary | ICD-10-CM | POA: Diagnosis not present

## 2022-06-28 DIAGNOSIS — J939 Pneumothorax, unspecified: Secondary | ICD-10-CM | POA: Diagnosis not present

## 2022-06-28 DIAGNOSIS — N6320 Unspecified lump in the left breast, unspecified quadrant: Secondary | ICD-10-CM | POA: Diagnosis not present

## 2022-06-28 DIAGNOSIS — I110 Hypertensive heart disease with heart failure: Secondary | ICD-10-CM | POA: Diagnosis not present

## 2022-06-28 LAB — HEPATIC FUNCTION PANEL
ALT: 62 U/L — ABNORMAL HIGH (ref 0–44)
AST: 44 U/L — ABNORMAL HIGH (ref 15–41)
Albumin: 2.5 g/dL — ABNORMAL LOW (ref 3.5–5.0)
Alkaline Phosphatase: 274 U/L — ABNORMAL HIGH (ref 38–126)
Bilirubin, Direct: 0.7 mg/dL — ABNORMAL HIGH (ref 0.0–0.2)
Indirect Bilirubin: 1.5 mg/dL — ABNORMAL HIGH (ref 0.3–0.9)
Total Bilirubin: 2.2 mg/dL — ABNORMAL HIGH (ref 0.3–1.2)
Total Protein: 6.8 g/dL (ref 6.5–8.1)

## 2022-06-28 LAB — CBC
HCT: 28.1 % — ABNORMAL LOW (ref 39.0–52.0)
Hemoglobin: 8.9 g/dL — ABNORMAL LOW (ref 13.0–17.0)
MCH: 29.8 pg (ref 26.0–34.0)
MCHC: 31.7 g/dL (ref 30.0–36.0)
MCV: 94 fL (ref 80.0–100.0)
Platelets: 207 10*3/uL (ref 150–400)
RBC: 2.99 MIL/uL — ABNORMAL LOW (ref 4.22–5.81)
RDW: 16.1 % — ABNORMAL HIGH (ref 11.5–15.5)
WBC: 7.3 10*3/uL (ref 4.0–10.5)
nRBC: 0 % (ref 0.0–0.2)

## 2022-06-28 LAB — TROPONIN I (HIGH SENSITIVITY)
Troponin I (High Sensitivity): 84 ng/L — ABNORMAL HIGH (ref <18)
Troponin I (High Sensitivity): 75 ng/L — ABNORMAL HIGH (ref <18)

## 2022-06-28 LAB — BASIC METABOLIC PANEL
Anion gap: 9 (ref 5–15)
BUN: 70 mg/dL — ABNORMAL HIGH (ref 8–23)
CO2: 23 mmol/L (ref 22–32)
Calcium: 8.2 mg/dL — ABNORMAL LOW (ref 8.9–10.3)
Chloride: 98 mmol/L (ref 98–111)
Creatinine, Ser: 1.69 mg/dL — ABNORMAL HIGH (ref 0.61–1.24)
GFR, Estimated: 42 mL/min — ABNORMAL LOW (ref >60)
Glucose, Bld: 385 mg/dL — ABNORMAL HIGH (ref 70–99)
Potassium: 4.8 mmol/L (ref 3.5–5.1)
Sodium: 130 mmol/L — ABNORMAL LOW (ref 135–145)

## 2022-06-28 LAB — FOLATE: Folate: 12.8 ng/mL (ref >5.9)

## 2022-06-28 LAB — CBG MONITORING, ED: Glucose-Capillary: 360 mg/dL — ABNORMAL HIGH (ref 70–99)

## 2022-06-28 LAB — PROCALCITONIN: Procalcitonin: 0.29 ng/mL

## 2022-06-28 LAB — SARS CORONAVIRUS 2 BY RT PCR: SARS Coronavirus 2 by RT PCR: NEGATIVE

## 2022-06-28 LAB — BRAIN NATRIURETIC PEPTIDE: B Natriuretic Peptide: 1068.8 pg/mL — ABNORMAL HIGH (ref 0.0–100.0)

## 2022-06-28 LAB — GLUCOSE, CAPILLARY: Glucose-Capillary: 341 mg/dL — ABNORMAL HIGH (ref 70–99)

## 2022-06-28 MED ORDER — INSULIN ASPART 100 UNIT/ML IJ SOLN
20.0000 [IU] | Freq: Three times a day (TID) | INTRAMUSCULAR | Status: DC
  Administered 2022-06-28 – 2022-06-29 (×2): 20 [IU] via SUBCUTANEOUS
  Filled 2022-06-28 (×2): qty 1

## 2022-06-28 MED ORDER — ACETAMINOPHEN 325 MG PO TABS
650.0000 mg | ORAL_TABLET | Freq: Once | ORAL | Status: AC
  Administered 2022-06-28: 650 mg via ORAL
  Filled 2022-06-28: qty 2

## 2022-06-28 MED ORDER — SPIRONOLACTONE 25 MG PO TABS
50.0000 mg | ORAL_TABLET | ORAL | Status: DC
  Administered 2022-06-29 – 2022-06-30 (×2): 50 mg via ORAL
  Filled 2022-06-28 (×2): qty 2

## 2022-06-28 MED ORDER — ENOXAPARIN SODIUM 80 MG/0.8ML IJ SOSY
0.5000 mg/kg | PREFILLED_SYRINGE | INTRAMUSCULAR | Status: DC
  Administered 2022-06-28: 65 mg via SUBCUTANEOUS
  Filled 2022-06-28 (×2): qty 0.65

## 2022-06-28 MED ORDER — INSULIN ASPART 100 UNIT/ML IJ SOLN
0.0000 [IU] | Freq: Three times a day (TID) | INTRAMUSCULAR | Status: DC
  Administered 2022-06-28: 15 [IU] via SUBCUTANEOUS
  Administered 2022-06-29 (×2): 5 [IU] via SUBCUTANEOUS
  Administered 2022-06-29: 8 [IU] via SUBCUTANEOUS
  Administered 2022-06-30: 2 [IU] via SUBCUTANEOUS
  Administered 2022-06-30: 3 [IU] via SUBCUTANEOUS
  Filled 2022-06-28 (×6): qty 1

## 2022-06-28 MED ORDER — ENOXAPARIN SODIUM 40 MG/0.4ML IJ SOSY: 40.0000 mg | PREFILLED_SYRINGE | INTRAMUSCULAR | Status: DC

## 2022-06-28 MED ORDER — TORSEMIDE 100 MG PO TABS
100.0000 mg | ORAL_TABLET | ORAL | Status: DC
  Administered 2022-06-29 – 2022-06-30 (×2): 100 mg via ORAL
  Filled 2022-06-28 (×2): qty 1

## 2022-06-28 MED ORDER — ASPIRIN 81 MG PO TBEC
162.0000 mg | DELAYED_RELEASE_TABLET | Freq: Every day | ORAL | Status: DC
  Administered 2022-06-29: 162 mg via ORAL
  Filled 2022-06-28: qty 2

## 2022-06-28 MED ORDER — TAMSULOSIN HCL 0.4 MG PO CAPS
0.4000 mg | ORAL_CAPSULE | Freq: Every day | ORAL | Status: DC
  Administered 2022-06-29: 0.4 mg via ORAL
  Filled 2022-06-28 (×2): qty 1

## 2022-06-28 MED ORDER — DILTIAZEM HCL 60 MG PO TABS
60.0000 mg | ORAL_TABLET | Freq: Three times a day (TID) | ORAL | Status: DC
  Administered 2022-06-28 – 2022-06-30 (×6): 60 mg via ORAL
  Filled 2022-06-28 (×7): qty 1

## 2022-06-28 MED ORDER — INSULIN GLARGINE-YFGN 100 UNIT/ML ~~LOC~~ SOLN
15.0000 [IU] | Freq: Every day | SUBCUTANEOUS | Status: DC
  Administered 2022-06-28 – 2022-06-29 (×2): 15 [IU] via SUBCUTANEOUS
  Filled 2022-06-28 (×4): qty 0.15

## 2022-06-28 NOTE — ED Notes (Addendum)
Charted in error.

## 2022-06-28 NOTE — Progress Notes (Signed)
PHARMACIST - PHYSICIAN COMMUNICATION  CONCERNING:  Enoxaparin (Lovenox) for DVT Prophylaxis    RECOMMENDATION: Patient was prescribed enoxaprin 40mg  q24 hours for VTE prophylaxis.   Filed Weights   06/28/22 1347  Weight: 127.5 kg (281 lb)    Body mass index is 38.11 kg/m.  Estimated Creatinine Clearance: 53.7 mL/min (A) (by C-G formula based on SCr of 1.69 mg/dL (H)).   Based on [facility] policy patient is candidate for enoxaparin 0.5mg /kg TBW SQ every 24 hours based on BMI being >30.  DESCRIPTION: Pharmacy has adjusted enoxaparin dose per [facility] policy.  Patient is now receiving enoxaparin 65 mg every 24 hours    Foye Deer, PharmD Clinical Pharmacist  06/28/2022 3:39 PM

## 2022-06-28 NOTE — Telephone Encounter (Signed)
On call message-   Patient's son called. Pt is functionally declining over the past 1 week. He is feeling very weak today to the point that he is unable to get out of the bed. Requesting his son/daughter to get things for me. Also worsening sob on exertion. He is concerned if fluid is accumulating in the lungs again.   Advised ED eval. Agreeable.   Cc Dr. Orlie Dakin, Lysle Dingwall.

## 2022-06-28 NOTE — ED Triage Notes (Addendum)
Pt to ED via POV from home. Pt reports ongoing fatigue and SOB worsening with exertion. Pt had fluid drawn off of him and found mass in left lung that tested + for melanoma. Son states pt has been on a decline over the past week with increased weakness and inability to get out of bed. Son spoke with chemo RN and referred them to come into ER. Pt also with hx CHF and CKD.

## 2022-06-28 NOTE — H&P (Signed)
History and Physical    [name] ZOX:[id number] DOB: [birth year] DOA: 06/28/2022  PCP: Dorothey Baseman, MD  Patient coming from: home  I have personally briefly reviewed patient's old medical records in [facility]  Chief Complaint: dyspnea and weakness  HPI: [name] is a 72 y.o. male with medical history significant of Stage IV melanoma with lung metastasis currently undergoing treatment, sCHF, DM2, CKD 3a who presented from home after not being able to get out of bed today.  Pt started having dyspnea and weakness about 1 week ago, and worsened in the past few days.  Reported cough but no sputum production.  No increased swelling and pt has been taking his torsemide and aldactone with good urine output.  Normal oral intake and hydration.  Weakness is below the pelvis and equal in both legs.  Pt usually walks around ok in the house and occasionally uses a cane, but has needed to use a walker in recent days, and on the day of presentation, pt could not get out of bed.  No fever, chest pain, abdominal pain, N/V/D, dysuria.  Pt received 1st round of immuno-therapy for his melanoma about 3 weeks ago and recovered well from it.  Pt said he used to have frequent low BG reading down to 40's around 2 am, but outpatient provider reduced his long-acting insulin, and now has low BG less frequently.  Pt denied hx of Afib.  Pt was last discharged on 04/26/22 after being treated for acute hypoxic respiratory failure 2/2 Flu A, volume overload (received lasix gtt) and large left effusion that required pigtail chest tube placement.   ED Course: initial vitals: afebrile, pulse 106, BP 139/59, RR 18, sating 99% on room air.  Labs notable for Cr 1.69, alkphos 274, trop 84 (down from 1039 two months ago), normal WBC, Hgb 8.9.  COVID neg.  EKG showed Afib with rate of 102.  CXR showed "Persistent opacity at the LEFT lung base, corresponding to the previously documented LEFT breast mass and adjacent  pleural effusion."   Assessment/Plan  # Dyspnea --No obvious etiology.  O2 sats 99-100% on room air and pt spoke full sentences with normal work of breathing.  Some cough but otherwise no obvious evidence of URI or PNA.  Left pleural effusion present, but appeared small.  Pt appears to have new-onset Afib w rate in 100's, maybe that's the cause of pt's subjective dyspnea? Plan: --treat Afib w RVR --will assess for need for thoracentesis tomorrow  # Weakness --in both legs, equal on both sides.  No focal neurological deficit.  Having trouble walking now.  May be due to deconditioning, active malignancy and treatment, but will continue medical workup. Plan: --PT  # Afib w RVR --pt is followed by cardio but no mention of prior hx of Afib.   --presented in Afib with rate of 100's.   Plan: --start dilt 60 mg q8h (pt is intolerant of BB, per cardio) --will obtain cardio consult tomorrow ([facility])  # DM2 --last A1c 5.9.  Pt reported having low BG in 40's and having to reduce home Lantus. Plan: --cont long-acting as reduced glargine 15u nightly --mealtime insulin at reduced 20u TID --ACHS and SSI  # Stage IV melanoma with lung metastasis  --currently on treatment with Dr. Orlie Dakin  # Chronic sCHF --does not appear grossly fluid overloaded.  Compliant with home torsemide and aldactone. --cont home torsemide and aldactone  Mild trop elevation --trop 84, 75, down from 1039 two  months ago.  No chest pain.  Trop elevation likely just residual from 2 months ago.  No clinical significance.    # Hx of iron def anemia --follows with Dr. Orlie Dakin  # Hx of CAD  s/p PCI mid LAD (2001)  --cont home ASA --resume statin after discharge  # CKD 3a  # Obesity, BMI 38  Hx of MGUS Hx of Nephrotic range proteinuria    DVT prophylaxis: Lovenox SQ Code Status: Full code  Family Communication: son updated at bedside on admission  Disposition Plan: home  Consults called:  Level of care:  Med-Surg   Review of Systems: As per HPI otherwise complete review of systems negative.   Past Medical History:  Diagnosis Date   Aortic atherosclerosis    Arthritis    Bilateral carotid artery stenosis    a.) carotid doppler 05/07/2022: 1-39% BICA   CHF (congestive heart failure), NYHA class II, acute on chronic, systolic    a.) TTE 01/09/2015: EF 35%, LVH, inf HK, LAE, triv TR/PR, mild MR, G1DD; b.) TTE 04/09/2017: EF 45%, mod LVH, post HK, triv MR/TR; c.) TTE 07/26/2018: EF 40%, LVH, inf/post HK, LAE, RVE, triv-mild panval regurg, G1DD; d.) TTE 06/01/2019: EF 40%, LVH, api/inf HK, BAE, triv MR/TR/PR, G1DD; e.) TTE 05/20/2021: EF 45%, LVH, LAE, triv MR/TR/PR; f.) TTE 04/12/2022: EF 45-50%, glob HK, BAE, triv MR, G2DD   Chronic kidney disease (CKD), stage II (mild)    Chronic membranous glomerulonephritis    Coronary artery disease    a.) LHC 05/02/1999: 25/25/75% mLAD --> 2.5 x 18 mm Guidant/ACE OTW Tetra   DDD (degenerative disc disease), lumbar    Diabetic neuropathy    Dysplastic nevus 08/26/2020   L flank, moderate atypia   Dyspnea on exertion    Elevated troponin    History of bilateral cataract extraction    Hyperlipidemia    Hypertension    Iron deficiency anemia    Ischemic cardiomyopathy    Lymphedema    Melanoma 08/20/2015   Right neck. Superficial spreading, arising in nevus. Tumor thickness 1.82mm, Anatomic level IV   Melanoma in situ 05/22/2020   R lateral neck ant to scar, exc 05/22/20   Osteomyelitis of great toe of left foot    Pulmonary mass 04/23/2022   a.)  CT chest 04/23/2022: LLL mass suspicious for bronchogenic neoplasm with associated peripheral changes; b.)  PET CT 05/12/2022: Hypermetabolic LEFT lower lobe mass measuring 8.5 x 4.6 cm (SUV max 23) disease; second foci of metabolic activity slightly more inferior with similar SUV intensity.   RBBB (right bundle branch block)    Recurrent left pleural effusion    Skin cancer    removed l arm   Sleep  apnea    a.) s/p uvulectomy 1990   Spinal stenosis of lumbar region with radiculopathy    Swelling of both lower extremities    T2DM (type 2 diabetes mellitus)    a.) uses Dexcom G6 CGM   Venous ulcer of ankle     Past Surgical History:  Procedure Laterality Date   CATARACT EXTRACTION W/PHACO Right 05/03/2019   Procedure: CATARACT EXTRACTION PHACO AND INTRAOCULAR LENS PLACEMENT (IOC) RIGHT DIABETIC 6.44 00:58.4 11.0%;  Surgeon: Lockie Mola, MD;  Location: [facility];  Service: Ophthalmology;  Laterality: Right;  DIABETIC   CATARACT EXTRACTION W/PHACO Left 06/28/2019   Procedure: CATARACT EXTRACTION PHACO AND INTRAOCULAR LENS PLACEMENT (IOC) LEFT DIABETIC 7.99  01:13.4  10.9% ;  Surgeon: Lockie Mola, MD;  Location: [facility];  Service: Ophthalmology;  Laterality: Left;  Diabetic - insulin and oral meds   CHOLECYSTECTOMY  1985   CORONARY ANGIOPLASTY WITH STENT PLACEMENT Left 05/02/1999   LUMBAR LAMINECTOMY/DECOMPRESSION MICRODISCECTOMY  04/13/2012   Procedure: LUMBAR LAMINECTOMY/DECOMPRESSION MICRODISCECTOMY 1 LEVEL;  Surgeon: Javier Docker, MD;  Location: [facility];  Service: Orthopedics;  Laterality: Bilateral;  L4-L5   TOE AMPUTATION Left    big toe   TOTAL KNEE ARTHROPLASTY Right 2007   UVULECTOMY  11/14/1988   VIDEO BRONCHOSCOPY WITH ENDOBRONCHIAL ULTRASOUND N/A 05/15/2022   Procedure: VIDEO BRONCHOSCOPY WITH ENDOBRONCHIAL ULTRASOUND;  Surgeon: Vida Rigger, MD;  Location: [facility];  Service: Thoracic;  Laterality: N/A;     reports that he has never smoked. He has never used smokeless tobacco. He reports current alcohol use. He reports that he does not use drugs.  Allergies  Allergen Reactions   Carvedilol Itching, Other (See Comments) and Swelling    Pain, itching and swelling beneath scrotum to anus.   Metoprolol Other (See Comments)    Cant remember  Cant remember  Cant remember  Cant remember   Toprol Xl [Metoprolol Tartrate] Other  (See Comments)    Cant remember   Buprenorphine Itching and Rash   Buprenorphine Hcl Itching and Rash   Morphine Itching and Other (See Comments)   Morphine And Related Itching, Rash and Other (See Comments)   Triamcinolone Rash   Triamcinolone Acetonide Rash    Family History  Problem Relation Age of Onset   Cancer Mother     Prior to Admission medications   Medication Sig Start Date End Date Taking? Authorizing Provider  aspirin EC 81 MG tablet Take 162 mg by mouth daily. Swallow whole.    [provider]  atorvastatin (LIPITOR) 80 MG tablet Take 80 mg by mouth at bedtime. 09/11/19   [provider]  Continuous Blood Gluc Receiver (DEXCOM G6 RECEIVER) DEVI Use to monitor blood sugar. [facility] [address]-40 11/15/19   [provider]  Continuous Blood Gluc Sensor (DEXCOM G6 SENSOR) MISC Use to monitor blood sugar.  Replace every 10 days. NDC [address]-29. 11/15/19   [provider]  Continuous Blood Gluc Transmit (DEXCOM G6 TRANSMITTER) MISC Use to monitor blood sugar.  Replace every 3 months. [facility] [address]-78. 11/15/19   [provider]  ferrous sulfate (FEROSUL) 325 (65 FE) MG tablet Take 325 mg by mouth 2 (two) times daily with a meal. 10/26/20   [provider]  insulin aspart (NOVOLOG) 100 UNIT/ML injection Inject into the skin 3 (three) times daily before meals.    [provider]  insulin glargine (LANTUS) 100 UNIT/ML injection Inject 34 Units into the skin at bedtime.    [provider]  Insulin Pen Needle (COMFORT EZ PEN NEEDLES) 32G X 8 MM MISC See admin instructions. 05/21/20   [provider]  NOVOLOG FLEXPEN 100 UNIT/ML FlexPen INJ UP TO 50 UNI D [place] IN MULTIPLE INJECTIONS B MEALS UTD 11/27/21   [provider]  prochlorperazine (COMPAZINE) 10 MG tablet Take 1 tablet (10 mg total) by mouth every 6 (six) hours as needed for nausea or vomiting. 06/01/22   Jeralyn Ruths, MD  spironolactone  (ALDACTONE) 25 MG tablet Take 50 mg by mouth every morning. 06/06/21   [provider]  tacrolimus (PROGRAF) 1 MG capsule Take 1 mg by mouth 2 (two) times daily.    [provider]  tamsulosin (FLOMAX) 0.4 MG CAPS capsule Take 1 capsule (0.4 mg total) by mouth daily  after supper. Patient taking differently: Take 0.4 mg by mouth daily after breakfast. 04/26/22 04/26/23  Gillis Santa, MD  torsemide (DEMADEX) 100 MG tablet Take 100 mg by mouth every morning.    [provider]  Vitamin D, Ergocalciferol, (DRISDOL) 1.25 MG (50000 UNIT) CAPS capsule Take 1 capsule (50,000 Units total) by mouth every 7 (seven) days. Patient taking differently: Take 50,000 Units by mouth every 7 (seven) days. Fridays 05/01/22 07/30/22  Gillis Santa, MD    Physical Exam: Vitals:   06/28/22 1347 06/28/22 1349 06/28/22 1350  BP:   (!) 139/59  Pulse:  (!) 106   Resp:  18   Temp:  98.6 F (37 C)   TempSrc:  Oral   SpO2:  99%   Weight: 127.5 kg    Height: 6' (1.829 m)      Constitutional: NAD, AAOx3 HEENT: conjunctivae and lids normal, EOMI CV: No cyanosis.   RESP: normal respiratory effort, on RA Extremities: edema in BLE SKIN: warm, dry Neuro: II - XII grossly intact.   Psych: Normal mood and affect.  Appropriate judgement and reason  Labs on Admission: I have personally reviewed labs and imaging studies  Time spent: 75 minutes  Darlin Priestly MD [facility]  If 7PM-7AM, please contact night-coverage 06/28/2022, 5:09 PM

## 2022-06-28 NOTE — ED Provider Notes (Signed)
[facility] Provider Note    Event Date/Time   First MD Initiated Contact with Patient 06/28/22 1349     (approximate)   History   Chief Complaint: Shortness of Breath   HPI  [name] is a 72 y.o. male with a history of hypertension diabetes CHF melanoma and obesity who comes the ED due to worsening shortness of breath and dyspnea on exertion and orthopnea for the past week.  No increasing and leg swelling.  No chest pain.  No fever.  Previously had a pleural effusion requiring drainage, and testing showed that it was due to melanoma.     Physical Exam   Triage Vital Signs: ED Triage Vitals  Enc Vitals Group     BP 06/28/22 1350 (!) 139/59     Pulse Rate 06/28/22 1349 (!) 106     Resp 06/28/22 1349 18     Temp 06/28/22 1349 98.6 F (37 C)     Temp Source 06/28/22 1349 Oral     SpO2 06/28/22 1349 99 %     Weight 06/28/22 1347 281 lb (127.5 kg)     Height 06/28/22 1347 6' (1.829 m)     Head Circumference --      Peak Flow --      Pain Score 06/28/22 1347 0     Pain Loc --      Pain Edu? --      Excl. in GC? --     Most recent vital signs: Vitals:   06/28/22 1349 06/28/22 1350  BP:  (!) 139/59  Pulse: (!) 106   Resp: 18   Temp: 98.6 F (37 C)   SpO2: 99%     General: Awake, no distress.  CV:  Good peripheral perfusion.  Irregular rhythm, heart rate 100 Resp:  Normal effort.  Diminished breath sounds on the left Abd:  No distention.  Soft nontender Other:  Chronic edema bilateral lower extremity, left greater than right, baseline according to patient.  No calf tenderness.   ED Results / Procedures / Treatments   Labs (all labs ordered are listed, but only abnormal results are displayed) Labs Reviewed  BASIC METABOLIC PANEL - Abnormal; Notable for the following components:      Result Value   Sodium 130 (*)    Glucose, Bld 385 (*)    BUN 70 (*)    Creatinine, Ser 1.69 (*)    Calcium 8.2 (*)    GFR, Estimated 42 (*)     All other components within normal limits  CBC - Abnormal; Notable for the following components:   RBC 2.99 (*)    Hemoglobin 8.9 (*)    HCT 28.1 (*)    RDW 16.1 (*)    All other components within normal limits  TROPONIN I (HIGH SENSITIVITY) - Abnormal; Notable for the following components:   Troponin I (High Sensitivity) 84 (*)    All other components within normal limits  TROPONIN I (HIGH SENSITIVITY)     EKG Interpreted by me Atrial fibrillation, rate of 102.  Normal axis.  Right bundle branch block.  No acute ischemic changes.   RADIOLOGY Chest x-ray interpreted by me, shows left pleural effusion.  Radiology report reviewed   PROCEDURES:  Procedures   MEDICATIONS ORDERED IN ED: Medications - No data to display   IMPRESSION / MDM / ASSESSMENT AND PLAN / ED COURSE  I reviewed the triage vital signs and the nursing notes.  DDx: Pleural effusion,  pulmonary edema, pneumonia, pneumothorax.  Doubt ACS PE dissection pericardial effusion  Patient's presentation is most consistent with acute presentation with potential threat to life or bodily function.  Patient presents with worsening shortness of breath, DOE, orthopnea.  Exam and chest x-ray show recurrent pleural effusion.  Serum labs are stable.  Troponin of 84 is decreased from previous level 11,000.  Will trend.  Discussed findings with patient, and due to severity of symptoms impairing his ability to perform his ADLs at home, he will need to be hospitalized for thoracentesis.  Oxygenation is okay, not in distress or requiring immediate thoracostomy.       FINAL CLINICAL IMPRESSION(S) / ED DIAGNOSES   Final diagnoses:  Recurrent left pleural effusion  Chronic congestive heart failure, unspecified heart failure type  Morbid obesity     Rx / DC Orders   ED Discharge Orders     None        Note:  This document was prepared using Dragon voice recognition software and may include unintentional dictation  errors.   Sharman Cheek, MD 06/28/22 [phone]

## 2022-06-29 ENCOUNTER — Inpatient Hospital Stay: Payer: Medicare HMO | Admitting: Oncology

## 2022-06-29 ENCOUNTER — Inpatient Hospital Stay: Payer: Medicare HMO

## 2022-06-29 ENCOUNTER — Other Ambulatory Visit: Payer: Self-pay | Admitting: Oncology

## 2022-06-29 DIAGNOSIS — R0602 Shortness of breath: Secondary | ICD-10-CM | POA: Diagnosis not present

## 2022-06-29 DIAGNOSIS — C439 Malignant melanoma of skin, unspecified: Secondary | ICD-10-CM | POA: Diagnosis not present

## 2022-06-29 DIAGNOSIS — I2510 Atherosclerotic heart disease of native coronary artery without angina pectoris: Secondary | ICD-10-CM | POA: Diagnosis not present

## 2022-06-29 DIAGNOSIS — I5030 Unspecified diastolic (congestive) heart failure: Secondary | ICD-10-CM | POA: Diagnosis not present

## 2022-06-29 DIAGNOSIS — I2489 Other forms of acute ischemic heart disease: Secondary | ICD-10-CM | POA: Diagnosis not present

## 2022-06-29 DIAGNOSIS — C434 Malignant melanoma of scalp and neck: Secondary | ICD-10-CM

## 2022-06-29 DIAGNOSIS — I480 Paroxysmal atrial fibrillation: Secondary | ICD-10-CM | POA: Diagnosis not present

## 2022-06-29 LAB — BASIC METABOLIC PANEL
Anion gap: 7 (ref 5–15)
BUN: 77 mg/dL — ABNORMAL HIGH (ref 8–23)
CO2: 25 mmol/L (ref 22–32)
Calcium: 7.9 mg/dL — ABNORMAL LOW (ref 8.9–10.3)
Chloride: 100 mmol/L (ref 98–111)
Creatinine, Ser: 1.92 mg/dL — ABNORMAL HIGH (ref 0.61–1.24)
GFR, Estimated: 36 mL/min — ABNORMAL LOW (ref >60)
Glucose, Bld: 226 mg/dL — ABNORMAL HIGH (ref 70–99)
Potassium: 4.6 mmol/L (ref 3.5–5.1)
Sodium: 132 mmol/L — ABNORMAL LOW (ref 135–145)

## 2022-06-29 LAB — CBC
HCT: 23.4 % — ABNORMAL LOW (ref 39.0–52.0)
Hemoglobin: 7.4 g/dL — ABNORMAL LOW (ref 13.0–17.0)
MCH: 29.5 pg (ref 26.0–34.0)
MCHC: 31.6 g/dL (ref 30.0–36.0)
MCV: 93.2 fL (ref 80.0–100.0)
Platelets: 180 10*3/uL (ref 150–400)
RBC: 2.51 MIL/uL — ABNORMAL LOW (ref 4.22–5.81)
RDW: 16.1 % — ABNORMAL HIGH (ref 11.5–15.5)
WBC: 5.8 10*3/uL (ref 4.0–10.5)
nRBC: 0 % (ref 0.0–0.2)

## 2022-06-29 LAB — GLUCOSE, CAPILLARY
Glucose-Capillary: 220 mg/dL — ABNORMAL HIGH (ref 70–99)
Glucose-Capillary: 296 mg/dL — ABNORMAL HIGH (ref 70–99)
Glucose-Capillary: 219 mg/dL — ABNORMAL HIGH (ref 70–99)
Glucose-Capillary: 123 mg/dL — ABNORMAL HIGH (ref 70–99)

## 2022-06-29 LAB — MAGNESIUM: Magnesium: 2.2 mg/dL (ref 1.7–2.4)

## 2022-06-29 MED ORDER — ENOXAPARIN SODIUM 150 MG/ML IJ SOSY
1.0000 mg/kg | PREFILLED_SYRINGE | Freq: Two times a day (BID) | INTRAMUSCULAR | Status: DC
  Administered 2022-06-29 – 2022-06-30 (×2): 129 mg via SUBCUTANEOUS
  Filled 2022-06-29 (×3): qty 0.86

## 2022-06-29 MED ORDER — AMIODARONE HCL 200 MG PO TABS
200.0000 mg | ORAL_TABLET | Freq: Two times a day (BID) | ORAL | Status: DC
  Administered 2022-06-29 – 2022-06-30 (×3): 200 mg via ORAL
  Filled 2022-06-29 (×3): qty 1

## 2022-06-29 MED ORDER — INSULIN ASPART 100 UNIT/ML IJ SOLN
30.0000 [IU] | Freq: Three times a day (TID) | INTRAMUSCULAR | Status: DC
  Administered 2022-06-29 – 2022-06-30 (×4): 30 [IU] via SUBCUTANEOUS
  Filled 2022-06-29 (×4): qty 1

## 2022-06-29 MED ORDER — AMIODARONE HCL 200 MG PO TABS: 200.0000 mg | ORAL_TABLET | Freq: Every day | ORAL | Status: DC

## 2022-06-29 MED ORDER — ACETAMINOPHEN 325 MG PO TABS
650.0000 mg | ORAL_TABLET | Freq: Once | ORAL | Status: AC
  Administered 2022-06-30: 650 mg via ORAL
  Filled 2022-06-29: qty 2

## 2022-06-29 MED ORDER — ASPIRIN 81 MG PO TBEC
81.0000 mg | DELAYED_RELEASE_TABLET | Freq: Every day | ORAL | Status: DC
  Administered 2022-06-30: 81 mg via ORAL
  Filled 2022-06-29: qty 1

## 2022-06-29 NOTE — Consult Note (Signed)
[facility]  [phone] [phone]  ID: Melvyn Novas OB: 1951/02/24  MR#: [id number]  CSN#:[id number]  Patient Care Team: Dorothey Baseman, MD as PCP - General (Family Medicine) Stanton Kidney, MD as Consulting Physician (Gastroenterology) Jeralyn Ruths, MD as Consulting Physician (Oncology) Kemper Durie, RN as [facility] Care Management  CHIEF COMPLAINT: Stage IV melanoma, shortness of breath.  INTERVAL HISTORY: Patient is a 72 year old male who receives his first dose of combination immunotherapy 3 weeks ago for the above-stated melanoma.  He Toller treatment well.  Approximate 1 week ago he noticed increased weakness and fatigue as well as increasing shortness of breath.  Upon evaluation emergency room patient was found to be in atrial fibrillation.  He has no neurologic complaints.  He denies any recent fevers or illnesses.  He has a fair appetite.  He has no chest pain, cough, or hemoptysis.  He denies any nausea, vomiting, constipation, or diarrhea.  He has no urinary complaints.  Patient offers no further specific complaints today.  REVIEW OF SYSTEMS:   Review of Systems  Constitutional:  Positive for malaise/fatigue. Negative for fever and weight loss.  Respiratory:  Positive for shortness of breath. Negative for cough and hemoptysis.   Cardiovascular: Negative.  Negative for chest pain and leg swelling.  Gastrointestinal: Negative.  Negative for abdominal pain and diarrhea.  Genitourinary: Negative.  Negative for dysuria.  Musculoskeletal: Negative.  Negative for back pain.  Skin: Negative.  Negative for rash.  Neurological:  Positive for weakness. Negative for focal weakness and headaches.  Psychiatric/Behavioral: Negative.  The patient is not nervous/anxious.     As per HPI. Otherwise, a complete review of systems is negative.  PAST MEDICAL HISTORY: Past Medical History:  Diagnosis Date   Aortic  atherosclerosis    Arthritis    Bilateral carotid artery stenosis    a.) carotid doppler 05/07/2022: 1-39% BICA   CHF (congestive heart failure), NYHA class II, acute on chronic, systolic    a.) TTE 01/09/2015: EF 35%, LVH, inf HK, LAE, triv TR/PR, mild MR, G1DD; b.) TTE 04/09/2017: EF 45%, mod LVH, post HK, triv MR/TR; c.) TTE 07/26/2018: EF 40%, LVH, inf/post HK, LAE, RVE, triv-mild panval regurg, G1DD; d.) TTE 06/01/2019: EF 40%, LVH, api/inf HK, BAE, triv MR/TR/PR, G1DD; e.) TTE 05/20/2021: EF 45%, LVH, LAE, triv MR/TR/PR; f.) TTE 04/12/2022: EF 45-50%, glob HK, BAE, triv MR, G2DD   Chronic kidney disease (CKD), stage II (mild)    Chronic membranous glomerulonephritis    Coronary artery disease    a.) LHC 05/02/1999: 25/25/75% mLAD --> 2.5 x 18 mm Guidant/ACE OTW Tetra   DDD (degenerative disc disease), lumbar    Diabetic neuropathy    Dysplastic nevus 08/26/2020   L flank, moderate atypia   Dyspnea on exertion    Elevated troponin    History of bilateral cataract extraction    Hyperlipidemia    Hypertension    Iron deficiency anemia    Ischemic cardiomyopathy    Lymphedema    Melanoma 08/20/2015   Right neck. Superficial spreading, arising in nevus. Tumor thickness 1.88mm, Anatomic level IV   Melanoma in situ 05/22/2020   R lateral neck ant to scar, exc 05/22/20   Osteomyelitis of great toe of left foot    Pulmonary mass 04/23/2022   a.)  CT chest 04/23/2022: LLL mass suspicious for bronchogenic neoplasm with associated peripheral changes; b.)  PET CT 05/12/2022: Hypermetabolic LEFT lower lobe mass measuring 8.5 x 4.6 cm (  SUV max 23) disease; second foci of metabolic activity slightly more inferior with similar SUV intensity.   RBBB (right bundle branch block)    Recurrent left pleural effusion    Skin cancer    removed l arm   Sleep apnea    a.) s/p uvulectomy 1990   Spinal stenosis of lumbar region with radiculopathy    Swelling of both lower extremities    T2DM (type 2  diabetes mellitus)    a.) uses Dexcom G6 CGM   Venous ulcer of ankle     PAST SURGICAL HISTORY: Past Surgical History:  Procedure Laterality Date   CATARACT EXTRACTION W/PHACO Right 05/03/2019   Procedure: CATARACT EXTRACTION PHACO AND INTRAOCULAR LENS PLACEMENT (IOC) RIGHT DIABETIC 6.44 00:58.4 11.0%;  Surgeon: Lockie Mola, MD;  Location: [facility];  Service: Ophthalmology;  Laterality: Right;  DIABETIC   CATARACT EXTRACTION W/PHACO Left 06/28/2019   Procedure: CATARACT EXTRACTION PHACO AND INTRAOCULAR LENS PLACEMENT (IOC) LEFT DIABETIC 7.99  01:13.4  10.9% ;  Surgeon: Lockie Mola, MD;  Location: [facility];  Service: Ophthalmology;  Laterality: Left;  Diabetic - insulin and oral meds   CHOLECYSTECTOMY  1985   CORONARY ANGIOPLASTY WITH STENT PLACEMENT Left 05/02/1999   LUMBAR LAMINECTOMY/DECOMPRESSION MICRODISCECTOMY  04/13/2012   Procedure: LUMBAR LAMINECTOMY/DECOMPRESSION MICRODISCECTOMY 1 LEVEL;  Surgeon: Javier Docker, MD;  Location: [facility];  Service: Orthopedics;  Laterality: Bilateral;  L4-L5   TOE AMPUTATION Left    big toe   TOTAL KNEE ARTHROPLASTY Right 2007   UVULECTOMY  11/14/1988   VIDEO BRONCHOSCOPY WITH ENDOBRONCHIAL ULTRASOUND N/A 05/15/2022   Procedure: VIDEO BRONCHOSCOPY WITH ENDOBRONCHIAL ULTRASOUND;  Surgeon: Vida Rigger, MD;  Location: [facility];  Service: Thoracic;  Laterality: N/A;    FAMILY HISTORY: Family History  Problem Relation Age of Onset   Cancer Mother     ADVANCED DIRECTIVES (Y/N):  @ADVDIR @  HEALTH MAINTENANCE: Social History   Tobacco Use   Smoking status: Never   Smokeless tobacco: Never  Vaping Use   Vaping Use: Never used  Substance Use Topics   Alcohol use: Yes    Comment: rare   Drug use: No     Colonoscopy:  PAP:  Bone density:  Lipid panel:  Allergies  Allergen Reactions   Carvedilol Itching, Other (See Comments) and Swelling    Pain, itching and swelling beneath scrotum to anus.    Metoprolol Other (See Comments)    Cant remember  Cant remember  Cant remember  Cant remember   Toprol Xl [Metoprolol Tartrate] Other (See Comments)    Cant remember   Buprenorphine Itching and Rash   Buprenorphine Hcl Itching and Rash   Morphine Itching and Other (See Comments)   Morphine And Related Itching, Rash and Other (See Comments)   Triamcinolone Rash   Triamcinolone Acetonide Rash    Current Facility-Administered Medications  Medication Dose Route Frequency Provider Last Rate Last Admin   amiodarone (PACERONE) tablet 200 mg  200 mg Oral BID Rebeca Allegra, PA-C   200 mg at 06/29/22 1233   Followed by   Melene Muller ON 07/06/2022] amiodarone (PACERONE) tablet 200 mg  200 mg Oral Daily Rebeca Allegra, [place]       [START ON 06/30/2022] aspirin EC tablet 81 mg  81 mg Oral Daily Tang, Lily Michelle, PA-C       diltiazem (CARDIZEM) tablet 60 mg  60 mg Oral Q8H Darlin Priestly, MD   60 mg at 06/29/22 1429   enoxaparin (LOVENOX) injection  129 mg  1 mg/kg Subcutaneous Q12H Angelique Blonder, RPH   129 mg at 06/29/22 1429   insulin aspart (novoLOG) injection 0-15 Units  0-15 Units Subcutaneous TID WC Darlin Priestly, MD   8 Units at 06/29/22 1233   insulin aspart (novoLOG) injection 30 Units  30 Units Subcutaneous TID WC Darlin Priestly, MD   30 Units at 06/29/22 1232   insulin glargine-yfgn (SEMGLEE) injection 15 Units  15 Units Subcutaneous QHS Darlin Priestly, MD   15 Units at 06/28/22 2206   spironolactone (ALDACTONE) tablet 50 mg  50 mg Oral Janalyn Rouse, MD   50 mg at 06/29/22 0810   tamsulosin (FLOMAX) capsule 0.4 mg  0.4 mg Oral QPC breakfast Darlin Priestly, MD   0.4 mg at 06/29/22 0810   torsemide (DEMADEX) tablet 100 mg  100 mg Oral Janalyn Rouse, MD   100 mg at 06/29/22 0811    OBJECTIVE: Vitals:   06/29/22 0736 06/29/22 1209  BP: (!) 114/55 120/63  Pulse: 90 87  Resp: 19 19  Temp: 98.2 F (36.8 C) 99 F (37.2 C)  SpO2: (!) 81% 97%     Body mass index is 38.75 kg/m.    ECOG FS:1 -  Symptomatic but completely ambulatory  General: Well-developed, well-nourished, no acute distress. Eyes: Pink conjunctiva, anicteric sclera. HEENT: Normocephalic, moist mucous membranes. Lungs: No audible wheezing or coughing. Heart: Regular rate and rhythm. Abdomen: Soft, nontender, no obvious distention. Musculoskeletal: No edema, cyanosis, or clubbing. Neuro: Alert, answering all questions appropriately. Cranial nerves grossly intact. Skin: No rashes or petechiae noted. Psych: Normal affect. Lymphatics: No cervical, calvicular, axillary or inguinal LAD.   LAB RESULTS:  Lab Results  Component Value Date   NA 132 (L) 06/29/2022   K 4.6 06/29/2022   CL 100 06/29/2022   CO2 25 06/29/2022   GLUCOSE 226 (H) 06/29/2022   BUN 77 (H) 06/29/2022   CREATININE 1.92 (H) 06/29/2022   CALCIUM 7.9 (L) 06/29/2022   PROT 6.8 06/28/2022   ALBUMIN 2.5 (L) 06/28/2022   AST 44 (H) 06/28/2022   ALT 62 (H) 06/28/2022   ALKPHOS 274 (H) 06/28/2022   BILITOT 2.2 (H) 06/28/2022   GFRNONAA 36 (L) 06/29/2022   GFRAA >60 10/15/2019    Lab Results  Component Value Date   WBC 5.8 06/29/2022   NEUTROABS 6.8 06/08/2022   HGB 7.4 (L) 06/29/2022   HCT 23.4 (L) 06/29/2022   MCV 93.2 06/29/2022   PLT 180 06/29/2022     STUDIES: US Abdomen Limited RUQ (LIVER/GB)  Result Date: 06/28/2022 CLINICAL DATA:  Elevated LFTs, history of cholecystectomy EXAM: ULTRASOUND ABDOMEN LIMITED RIGHT UPPER QUADRANT COMPARISON:  Abdominal ultrasound April 12, 2022 FINDINGS: Gallbladder: The gallbladder is surgically absent. No sonographic Murphy's sign is noted by the sonographer. Common bile duct: Diameter: 5 mm Liver: There is a 2.1 x 1.4 x 2.1 cm echogenic mass in the right hepatic lobe. The hepatic parenchymal echogenicity is within normal limits. Portal vein is patent on color Doppler imaging with normal direction of blood flow towards the liver. Other: None. IMPRESSION: 1. No etiology for elevated LFTs is  identified. 2. Incidentally visualized 2.1 cm echogenic mass in the right hepatic lobe, which is incompletely characterized. Recommend further assessment with abdominal MRI in the non-emergent setting. Electronically Signed   By: Jacob Moores M.D.   On: 06/28/2022 17:31   DG Chest 2 View  Result Date: 06/28/2022 CLINICAL DATA:  Shortness of breath, fatigue. EXAM: CHEST - 2 VIEW  COMPARISON:  Chest x-ray dated 05/15/2022 FINDINGS: Stable cardiomegaly. Persistent opacity at the LEFT lung base. RIGHT lung is clear. Pneumothorax is seen. No acute-appearing osseous abnormality. IMPRESSION: 1. Persistent opacity at the LEFT lung base, corresponding to the previously documented LEFT breast mass and adjacent pleural effusion. 2. Stable cardiomegaly. Electronically Signed   By: Bary Richard M.D.   On: 06/28/2022 14:40   MR Brain W Wo Contrast  Result Date: 06/17/2022 CLINICAL DATA:  Staging for metastatic melanoma. EXAM: MRI HEAD WITHOUT AND WITH CONTRAST TECHNIQUE: Multiplanar, multiecho pulse sequences of the brain and surrounding structures were obtained without and with intravenous contrast. CONTRAST:  10mL GADAVIST GADOBUTROL 1 MMOL/ML IV SOLN COMPARISON:  None FINDINGS: Brain: No acute infarct, hemorrhage, or mass lesion is present. No significant white matter lesions are present. The ventricles are of normal size. No significant extraaxial fluid collection is present. The brainstem and cerebellum are within normal limits. The internal auditory canals are within normal limits. Postcontrast images demonstrate no pathologic enhancement. Vascular: Flow is present in the major intracranial arteries. Skull and upper cervical spine: The craniocervical junction is normal. Upper cervical spine is within normal limits. Marrow signal is unremarkable. Sinuses/Orbits: Mild mucosal thickening is present in the inferior left maxillary sinus. The paranasal sinuses and mastoid air cells are otherwise clear. Bilateral lens  replacements are noted. Globes and orbits are otherwise unremarkable. IMPRESSION: Negative MRI of the brain. No evidence for metastatic disease to the brain. Electronically Signed   By: Marin Roberts M.D.   On: 06/17/2022 17:39    ASSESSMENT: Stage IV melanoma, shortness of breath.  PLAN:    Stage IV melanoma: Patient last received combination immunotherapy 3 weeks ago.  His next scheduled treatment was scheduled today.  Will defer treatment plan 1 week.  Patient has been instructed to return to clinic on July 06, 2022 for further evaluation, hospital follow-up, and consideration of cycle 2 of immunotherapy. Shortness of breath/A-fib: No other etiology has been determined.  Appreciate cardiology input. Renal insufficiency: Chronic and unchanged.  Patient's most recent creatinine is 1.92. Anemia: Patient's hemoglobin has trended down to 7.4.  Can consider blood transfusion if hemoglobin continues to trend down. Liver mass: Patient noted to have a 2.1 cm mass in the right hepatic lobe that is incompletely characterized.  PET scan results from May 12, 2022 did not reveal any liver pathology.  Will order MRI as an outpatient to further characterize.  Appreciate consult, will follow.   Jeralyn Ruths, MD   06/29/2022 4:22 PM

## 2022-06-29 NOTE — Progress Notes (Signed)
ANTICOAGULATION CONSULT NOTE - Initial Consult  Pharmacy Consult for Lovenox Indication: atrial fibrillation  Allergies  Allergen Reactions   Carvedilol Itching, Other (See Comments) and Swelling    Pain, itching and swelling beneath scrotum to anus.   Metoprolol Other (See Comments)    Cant remember  Cant remember  Cant remember  Cant remember   Toprol Xl [Metoprolol Tartrate] Other (See Comments)    Cant remember   Buprenorphine Itching and Rash   Buprenorphine Hcl Itching and Rash   Morphine Itching and Other (See Comments)   Morphine And Related Itching, Rash and Other (See Comments)   Triamcinolone Rash   Triamcinolone Acetonide Rash    Patient Measurements: Height: 6' (182.9 cm) Weight: 129.6 kg (285 lb 11.5 oz) IBW/kg (Calculated) : 77.6 Heparin Dosing Weight:    Vital Signs: Temp: 98.2 F (36.8 C) (04/15 0736) Temp Source: Oral (04/15 0512) BP: 114/55 (04/15 0736) Pulse Rate: 90 (04/15 0736)  Labs: Recent Labs    06/28/22 1349 06/28/22 1533 06/29/22 0317  HGB 8.9*  --  7.4*  HCT 28.1*  --  23.4*  PLT 207  --  180  CREATININE 1.69*  --  1.92*  TROPONINIHS 84* 75*  --     Estimated Creatinine Clearance: 47.7 mL/min (A) (by C-G formula based on SCr of 1.92 mg/dL (H)).   Medical History: Past Medical History:  Diagnosis Date   Aortic atherosclerosis    Arthritis    Bilateral carotid artery stenosis    a.) carotid doppler 05/07/2022: 1-39% BICA   CHF (congestive heart failure), NYHA class II, acute on chronic, systolic    a.) TTE 01/09/2015: EF 35%, LVH, inf HK, LAE, triv TR/PR, mild MR, G1DD; b.) TTE 04/09/2017: EF 45%, mod LVH, post HK, triv MR/TR; c.) TTE 07/26/2018: EF 40%, LVH, inf/post HK, LAE, RVE, triv-mild panval regurg, G1DD; d.) TTE 06/01/2019: EF 40%, LVH, api/inf HK, BAE, triv MR/TR/PR, G1DD; e.) TTE 05/20/2021: EF 45%, LVH, LAE, triv MR/TR/PR; f.) TTE 04/12/2022: EF 45-50%, glob HK, BAE, triv MR, G2DD   Chronic kidney disease (CKD),  stage II (mild)    Chronic membranous glomerulonephritis    Coronary artery disease    a.) LHC 05/02/1999: 25/25/75% mLAD --> 2.5 x 18 mm Guidant/ACE OTW Tetra   DDD (degenerative disc disease), lumbar    Diabetic neuropathy    Dysplastic nevus 08/26/2020   L flank, moderate atypia   Dyspnea on exertion    Elevated troponin    History of bilateral cataract extraction    Hyperlipidemia    Hypertension    Iron deficiency anemia    Ischemic cardiomyopathy    Lymphedema    Melanoma 08/20/2015   Right neck. Superficial spreading, arising in nevus. Tumor thickness 1.69mm, Anatomic level IV   Melanoma in situ 05/22/2020   R lateral neck ant to scar, exc 05/22/20   Osteomyelitis of great toe of left foot    Pulmonary mass 04/23/2022   a.)  CT chest 04/23/2022: LLL mass suspicious for bronchogenic neoplasm with associated peripheral changes; b.)  PET CT 05/12/2022: Hypermetabolic LEFT lower lobe mass measuring 8.5 x 4.6 cm (SUV max 23) disease; second foci of metabolic activity slightly more inferior with similar SUV intensity.   RBBB (right bundle branch block)    Recurrent left pleural effusion    Skin cancer    removed l arm   Sleep apnea    a.) s/p uvulectomy 1990   Spinal stenosis of lumbar region with radiculopathy  Swelling of both lower extremities    T2DM (type 2 diabetes mellitus)    a.) uses Dexcom G6 CGM   Venous ulcer of ankle     Medications:  Medications Prior to Admission  Medication Sig Dispense Refill Last Dose   aspirin EC 81 MG tablet Take 162 mg by mouth daily. Swallow whole.   06/27/2022 at 2000   atorvastatin (LIPITOR) 80 MG tablet Take 80 mg by mouth at bedtime.   06/27/2022 at 2000   ferrous sulfate (FEROSUL) 325 (65 FE) MG tablet Take 325 mg by mouth 2 (two) times daily with a meal.   06/27/2022 at 2000   insulin aspart (NOVOLOG) 100 UNIT/ML injection Inject into the skin 3 (three) times daily before meals.   06/27/2022 at 2000   insulin glargine (LANTUS) 100  UNIT/ML injection Inject 24 Units into the skin at bedtime.   06/27/2022 at 2000   prochlorperazine (COMPAZINE) 10 MG tablet Take 1 tablet (10 mg total) by mouth every 6 (six) hours as needed for nausea or vomiting. 30 tablet 1 unk   spironolactone (ALDACTONE) 50 MG tablet Take 50 mg by mouth daily.   06/27/2022   tacrolimus (PROGRAF) 1 MG capsule Take 1 mg by mouth 2 (two) times daily.   06/27/2022   torsemide (DEMADEX) 100 MG tablet Take 100 mg by mouth every morning.   06/27/2022   Vitamin D, Ergocalciferol, (DRISDOL) 1.25 MG (50000 UNIT) CAPS capsule Take 1 capsule (50,000 Units total) by mouth every 7 (seven) days. (Patient taking differently: Take 50,000 Units by mouth every 7 (seven) days. Fridays) 12 capsule 0 Past Month   Continuous Blood Gluc Receiver (DEXCOM G6 RECEIVER) DEVI Use to monitor blood sugar. NDC [phone]      Continuous Blood Gluc Sensor (DEXCOM G6 SENSOR) MISC Use to monitor blood sugar.  Replace every 10 days. NDC [phone].      Continuous Blood Gluc Transmit (DEXCOM G6 TRANSMITTER) MISC Use to monitor blood sugar.  Replace every 3 months. NDC [phone].      Insulin Pen Needle (COMFORT EZ PEN NEEDLES) 32G X 8 MM MISC See admin instructions.      tamsulosin (FLOMAX) 0.4 MG CAPS capsule Take 1 capsule (0.4 mg total) by mouth daily after supper. (Patient not taking: Reported on 06/28/2022) 30 capsule 11 Not Taking   Scheduled:   amiodarone  200 mg Oral BID   Followed by   Melene Muller ON 07/06/2022] amiodarone  200 mg Oral Daily   [START ON 06/30/2022] aspirin EC  81 mg Oral Daily   diltiazem  60 mg Oral Q8H   enoxaparin (LOVENOX) injection  1 mg/kg Subcutaneous Q12H   insulin aspart  0-15 Units Subcutaneous TID WC   insulin aspart  30 Units Subcutaneous TID WC   insulin glargine-yfgn  15 Units Subcutaneous QHS   spironolactone  50 mg Oral BH-[id number]   tamsulosin  0.4 mg Oral QPC breakfast   torsemide  100 mg Oral BH-q7a   Infusions:   Assessment: 72 yo M to start  Lovenox treatment dosing for Afib Hgb 7.4  plt 180    Last dose of Lovenox ppx was 4/14 @2205   Goal of Therapy:  Monitor platelets by anticoagulation protocol: Yes   Plan:  Will order Lovenox 1 mg/kg (129 kg) q12h for treatment dosing for Atrial Fibrillation F/u CBC daily and renal fxn  Neola Worrall A 06/29/2022,12:06 PM

## 2022-06-29 NOTE — Consult Note (Addendum)
[facility] CLINIC CARDIOLOGY CONSULT NOTE       Patient ID: [name] MRN: [id number] DOB/AGE: [name] 08, 1952 72 y.o.  Admit date: 06/28/2022 Referring Physician Dr. Darlin Priestly  Primary Physician Dr. Terance Hart Primary Cardiologist Dr. 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  EKG on admission showed atrial fibrillation with rate of 102 bpm.  Cardiology is consulted for further assistance.  History is obtained from the patient and his oldest son at bedside.  The patient has had progressive decline over the past week with his main concern of significant generalized weakness and dyspnea.  Prior to 1 week ago, the patient was able to mow his lawn with short rest breaks, and otherwise take care of himself.  The patient's son notes he was moving slower over the course the week to the point where he was unable to get himself out of bed yesterday AM, prompting his presentation. He denies worsening peripheral edema or chest discomfort. His initial concern was for recurrence of the left pleural effusion which has needed to be drained multiple times in the past. The patient is undergoing immunotherapy with Dr. Orlie Dakin for treatment of recurrent stage IV melanoma (left lung involvement) with last treatment 3 weeks ago. At my time of evaluation today, the patient says he feels worse in terms of his weakness today, was unable to stand himself up out of bed with a walker without assistance.  He denies chest  pain or significant shortness of breath at rest.  No chest tightness, palpitations, or heart racing.  He has chronic two-pillow orthopnea which is unchanged.  He has chronic peripheral edema/venous insufficiency which is slightly worse on exam today per the patient and his son.  Vitals notable for BP 114/55, HR in the 90s by pulse ox, irregular on exam. SpO2 >90 on room air. Labs notable for slight hyponatremia to 132, potassium and mag WNL at 4.6 and 2.2 respectively. Cr a little higher than baseline at 1.92 and GFR 36. (1.35 -- 1.47 recent BL), BNP up to 1068, HS troponin elevated but flat trending at 84, 75. H/H with decline overnight from 8.9 -- 7.4. Cxr read with persistent opacity at left base with adjacent pleural effusion. EKG on admission revealed AF with rate 102 bpm and chronic RBBB  Review of systems complete and found to be negative unless listed above     Past Medical History:  Diagnosis Date   Aortic atherosclerosis    Arthritis    Bilateral carotid artery stenosis    a.) carotid doppler 05/07/2022: 1-39% BICA   CHF (congestive heart failure), NYHA class II, acute on chronic, systolic    a.) TTE 01/09/2015: EF 35%, LVH, inf HK, LAE, triv TR/PR, mild MR, G1DD; b.) TTE 04/09/2017: EF 45%, mod LVH, post HK, triv MR/TR; c.) TTE 07/26/2018: EF 40%, LVH, inf/post HK, LAE, RVE, triv-mild panval regurg, G1DD; d.) TTE 06/01/2019: EF 40%, LVH, api/inf HK, BAE, triv MR/TR/PR, G1DD; e.) TTE 05/20/2021: EF 45%, LVH, LAE, triv MR/TR/PR; f.) TTE 04/12/2022: EF 45-50%, glob HK,  BAE, triv MR, G2DD   Chronic kidney disease (CKD), stage II (mild)    Chronic membranous glomerulonephritis    Coronary artery disease    a.) LHC 05/02/1999: 25/25/75% mLAD --> 2.5 x 18 mm Guidant/ACE OTW Tetra   DDD (degenerative disc disease), lumbar    Diabetic neuropathy    Dysplastic nevus 08/26/2020   L flank, moderate atypia   Dyspnea on exertion    Elevated troponin    History of bilateral cataract extraction     Hyperlipidemia    Hypertension    Iron deficiency anemia    Ischemic cardiomyopathy    Lymphedema    Melanoma 08/20/2015   Right neck. Superficial spreading, arising in nevus. Tumor thickness 1.36mm, Anatomic level IV   Melanoma in situ 05/22/2020   R lateral neck ant to scar, exc 05/22/20   Osteomyelitis of great toe of left foot    Pulmonary mass 04/23/2022   a.)  CT chest 04/23/2022: LLL mass suspicious for bronchogenic neoplasm with associated peripheral changes; b.)  PET CT 05/12/2022: Hypermetabolic LEFT lower lobe mass measuring 8.5 x 4.6 cm (SUV max 23) disease; second foci of metabolic activity slightly more inferior with similar SUV intensity.   RBBB (right bundle branch block)    Recurrent left pleural effusion    Skin cancer    removed l arm   Sleep apnea    a.) s/p uvulectomy 1990   Spinal stenosis of lumbar region with radiculopathy    Swelling of both lower extremities    T2DM (type 2 diabetes mellitus)    a.) uses Dexcom G6 CGM   Venous ulcer of ankle     Past Surgical History:  Procedure Laterality Date   CATARACT EXTRACTION W/PHACO Right 05/03/2019   Procedure: CATARACT EXTRACTION PHACO AND INTRAOCULAR LENS PLACEMENT (IOC) RIGHT DIABETIC 6.44 00:58.4 11.0%;  Surgeon: Lockie Mola, MD;  Location: [facility];  Service: Ophthalmology;  Laterality: Right;  DIABETIC   CATARACT EXTRACTION W/PHACO Left 06/28/2019   Procedure: CATARACT EXTRACTION PHACO AND INTRAOCULAR LENS PLACEMENT (IOC) LEFT DIABETIC 7.99  01:13.4  10.9% ;  Surgeon: Lockie Mola, MD;  Location: [facility];  Service: Ophthalmology;  Laterality: Left;  Diabetic - insulin and oral meds   CHOLECYSTECTOMY  1985   CORONARY ANGIOPLASTY WITH STENT PLACEMENT Left 05/02/1999   LUMBAR LAMINECTOMY/DECOMPRESSION MICRODISCECTOMY  04/13/2012   Procedure: LUMBAR LAMINECTOMY/DECOMPRESSION MICRODISCECTOMY 1 LEVEL;  Surgeon: Javier Docker, MD;  Location: [facility] ORS;  Service: Orthopedics;   Laterality: Bilateral;  L4-L5   TOE AMPUTATION Left    big toe   TOTAL KNEE ARTHROPLASTY Right 2007   UVULECTOMY  11/14/1988   VIDEO BRONCHOSCOPY WITH ENDOBRONCHIAL ULTRASOUND N/A 05/15/2022   Procedure: VIDEO BRONCHOSCOPY WITH ENDOBRONCHIAL ULTRASOUND;  Surgeon: Vida Rigger, MD;  Location: [facility] ORS;  Service: Thoracic;  Laterality: N/A;    Medications Prior to Admission  Medication Sig Dispense Refill Last Dose   aspirin EC 81 MG tablet Take 162 mg by mouth daily. Swallow whole.   06/27/2022 at 2000   atorvastatin (LIPITOR) 80 MG tablet Take 80 mg by mouth at bedtime.   06/27/2022 at 2000   ferrous sulfate (FEROSUL) 325 (65 FE) MG tablet Take 325 mg by mouth 2 (two) times daily with a meal.   06/27/2022 at 2000   insulin aspart (NOVOLOG) 100 UNIT/ML injection Inject into the skin 3 (three) times daily before meals.   06/27/2022 at 2000   insulin glargine (LANTUS) 100 UNIT/ML injection Inject 24 Units into the  skin at bedtime.   06/27/2022 at 2000   prochlorperazine (COMPAZINE) 10 MG tablet Take 1 tablet (10 mg total) by mouth every 6 (six) hours as needed for nausea or vomiting. 30 tablet 1 unk   spironolactone (ALDACTONE) 50 MG tablet Take 50 mg by mouth daily.   06/27/2022   tacrolimus (PROGRAF) 1 MG capsule Take 1 mg by mouth 2 (two) times daily.   06/27/2022   torsemide (DEMADEX) 100 MG tablet Take 100 mg by mouth every morning.   06/27/2022   Vitamin D, Ergocalciferol, (DRISDOL) 1.25 MG (50000 UNIT) CAPS capsule Take 1 capsule (50,000 Units total) by mouth every 7 (seven) days. (Patient taking differently: Take 50,000 Units by mouth every 7 (seven) days. Fridays) 12 capsule 0 Past Month   Continuous Blood Gluc Receiver (DEXCOM G6 RECEIVER) DEVI Use to monitor blood sugar. NDC 7573575354      Continuous Blood Gluc Sensor (DEXCOM G6 SENSOR) MISC Use to monitor blood sugar.  Replace every 10 days. NDC 4037700790.      Continuous Blood Gluc Transmit (DEXCOM G6 TRANSMITTER) MISC Use to monitor  blood sugar.  Replace every 3 months. NDC [phone].      Insulin Pen Needle (COMFORT EZ PEN NEEDLES) 32G X 8 MM MISC See admin instructions.      tamsulosin (FLOMAX) 0.4 MG CAPS capsule Take 1 capsule (0.4 mg total) by mouth daily after supper. (Patient not taking: Reported on 06/28/2022) 30 capsule 11 Not Taking   Social History   Socioeconomic History   Marital status: Widowed    Spouse name: Not on file   Number of children: Not on file   Years of education: Not on file   Highest education level: Not on file  Occupational History   Not on file  Tobacco Use   Smoking status: Never   Smokeless tobacco: Never  Vaping Use   Vaping Use: Never used  Substance and Sexual Activity   Alcohol use: Yes    Comment: rare   Drug use: No   Sexual activity: Not on file  Other Topics Concern   Not on file  Social History Narrative   Not on file   Social Determinants of Health   Financial Resource Strain: Not on file  Food Insecurity: No Food Insecurity (06/28/2022)   Hunger Vital Sign    [name]: Never true    [name]: Never true  Transportation Needs: No Transportation Needs (06/28/2022)   PRAPARE - Administrator, Civil Service (Medical): No    Lack of Transportation (Non-Medical): No  Physical Activity: Not on file  Stress: Not on file  Social Connections: Not on file  Intimate Partner Violence: Not At Risk (06/28/2022)   Humiliation, Afraid, Rape, and Kick questionnaire    Fear of Current or Ex-Partner: No    Emotionally Abused: No    Physically Abused: No    Sexually Abused: No    Family History  Problem Relation Age of Onset   Cancer Mother       Intake/Output Summary (Last 24 hours) at 06/29/2022 1021 Last data filed at 06/29/2022 0000 Gross per 24 hour  Intake --  Output 250 ml  Net -250 ml    Vitals:   06/28/22 2001 06/28/22 2034 06/29/22 0512 06/29/22 0736  BP: (!) 121/56 (!)  107/56 (!) 109/57 (!) 114/55  Pulse: 79 78 81 90  Resp: 17 16 16  19  Temp: 98 F (36.7 C) 97.7 F (36.5 C) 98.2 F (36.8 C) 98.2 F (36.8 C)  TempSrc: Oral  Oral   SpO2: 98% 100% 98% (!) 81%  Weight: 129.6 kg     Height: 6' (1.829 m)       PHYSICAL EXAM General: elderly caucasian male, fatigued appearing. Sitting up in bed with two sons present at bedside HEENT:  Normocephalic and atraumatic. Neck:  No JVD.  Lungs: Normal respiratory effort on room air.  Decreased breath sounds bilaterally, significantly reduced in left base.  Without appreciable crackles or wheezes. Heart: irregularly irregular with controlled rate. Normal S1 and S2 without gallops or murmurs.  Abdomen: Non-distended appearing with excess adiposity .  Msk: Normal strength and tone for age. Extremities: No clubbing, cyanosis.  Significant dryness and scaling of the skin of both shins.  2-3+ bilateral lower extremity edema.  Neuro: Alert and oriented X 3. Psych:  Answers questions appropriately.   Labs: Basic Metabolic Panel: Recent Labs    06/28/22 1349 06/29/22 0317  NA 130* 132*  K 4.8 4.6  CL 98 100  CO2 23 25  GLUCOSE 385* 226*  BUN 70* 77*  CREATININE 1.69* 1.92*  CALCIUM 8.2* 7.9*  MG  --  2.2   Liver Function Tests: Recent Labs    06/28/22 1350  AST 44*  ALT 62*  ALKPHOS 274*  BILITOT 2.2*  PROT 6.8  ALBUMIN 2.5*   No results for input(s): "LIPASE", "AMYLASE" in the last 72 hours. CBC: Recent Labs    06/28/22 1349 06/29/22 0317  WBC 7.3 5.8  HGB 8.9* 7.4*  HCT 28.1* 23.4*  MCV 94.0 93.2  PLT 207 180   Cardiac Enzymes: Recent Labs    06/28/22 1349 06/28/22 1533  TROPONINIHS 84* 75*   BNP: Recent Labs    06/28/22 1350  BNP 1,068.8*   D-Dimer: No results for input(s): "DDIMER" in the last 72 hours. Hemoglobin A1C: No results for input(s): "HGBA1C" in the last 72 hours. Fasting Lipid Panel: No results for input(s): "CHOL", "HDL", "LDLCALC", "TRIG", "CHOLHDL",  "LDLDIRECT" in the last 72 hours. Thyroid Function Tests: No results for input(s): "TSH", "T4TOTAL", "T3FREE", "THYROIDAB" in the last 72 hours.  Invalid input(s): "FREET3" Anemia Panel: Recent Labs    06/28/22 1533  FOLATE 12.8     Radiology: US Abdomen Limited RUQ (LIVER/GB)  Result Date: 06/28/2022 CLINICAL DATA:  Elevated LFTs, history of cholecystectomy EXAM: ULTRASOUND ABDOMEN LIMITED RIGHT UPPER QUADRANT COMPARISON:  Abdominal ultrasound April 12, 2022 FINDINGS: Gallbladder: The gallbladder is surgically absent. No sonographic Murphy's sign is noted by the sonographer. Common bile duct: Diameter: 5 mm Liver: There is a 2.1 x 1.4 x 2.1 cm echogenic mass in the right hepatic lobe. The hepatic parenchymal echogenicity is within normal limits. Portal vein is patent on color Doppler imaging with normal direction of blood flow towards the liver. Other: None. IMPRESSION: 1. No etiology for elevated LFTs is identified. 2. Incidentally visualized 2.1 cm echogenic mass in the right hepatic lobe, which is incompletely characterized. Recommend further assessment with abdominal MRI in the non-emergent setting. Electronically Signed   By: Jacob Moores M.D.   On: 06/28/2022 17:31   DG Chest 2 View  Result Date: 06/28/2022 CLINICAL DATA:  Shortness of breath, fatigue. EXAM: CHEST - 2 VIEW COMPARISON:  Chest x-ray dated 05/15/2022 FINDINGS: Stable cardiomegaly. Persistent opacity at the LEFT lung base. RIGHT lung is clear. Pneumothorax is seen. No acute-appearing osseous abnormality. IMPRESSION: 1. Persistent opacity  at the LEFT lung base, corresponding to the previously documented LEFT breast mass and adjacent pleural effusion. 2. Stable cardiomegaly. Electronically Signed   By: Bary Richard M.D.   On: 06/28/2022 14:40   MR Brain W Wo Contrast  Result Date: 06/17/2022 CLINICAL DATA:  Staging for metastatic melanoma. EXAM: MRI HEAD WITHOUT AND WITH CONTRAST TECHNIQUE: Multiplanar, multiecho pulse  sequences of the brain and surrounding structures were obtained without and with intravenous contrast. CONTRAST:  30mL GADAVIST GADOBUTROL 1 MMOL/ML IV SOLN COMPARISON:  None FINDINGS: Brain: No acute infarct, hemorrhage, or mass lesion is present. No significant white matter lesions are present. The ventricles are of normal size. No significant extraaxial fluid collection is present. The brainstem and cerebellum are within normal limits. The internal auditory canals are within normal limits. Postcontrast images demonstrate no pathologic enhancement. Vascular: Flow is present in the major intracranial arteries. Skull and upper cervical spine: The craniocervical junction is normal. Upper cervical spine is within normal limits. Marrow signal is unremarkable. Sinuses/Orbits: Mild mucosal thickening is present in the inferior left maxillary sinus. The paranasal sinuses and mastoid air cells are otherwise clear. Bilateral lens replacements are noted. Globes and orbits are otherwise unremarkable. IMPRESSION: Negative MRI of the brain. No evidence for metastatic disease to the brain. Electronically Signed   By: Marin Roberts M.D.   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  EKG on admission showed atrial fibrillation with rate of 102 bpm.  Cardiology is consulted for further assistance.  # Generalized weakness # new onset paroxysmal atrial fibrillation Not on tele this AM, initial EKG showed rate of 102 BPM, by pulse ox rate 70s to 90s. Symptomatic with dyspnea, but otherwise no heart racing or symptomatic palpitations. TSH and T4 WNL on check last month.  Patient still reports significant generalized weakness, likely multifactorial in nature - start tele monitoring - continue diltiazem 60mg  q6h for today, likely consolidate to CD dosing at discharge - start amiodarone 200mg  BID x 14 days, then 200mg  daily thereafter for rhythm control - lovenox for [facility] while inpatient in case procedures are needed. Discharge home on eliquis 5mg  BID for stroke prophylaxis. CHADS2-Vasc 4 (age, HTN, CAD, DM2).   # Stage IV malignant melanoma, recurrent to the left lung (2024) Undergoing immunotherapy by Dr. Orlie Dakin, last treatment 3 weeks ago  # anemia of chronic disease H/H with decline overnight from 8.9 -- 7.4.  No obvious bleeding (melena, hematochezia) continue to monitor closely   # demand supply ischemia Troponins borderline elevated and flat trending at 84, 75. In absence of chest pain or EKG changes, this is  most consistent with  demand/supply mismatch and not ACS   # chronic HFmrEF  45-50% by echo in jan 2024. On GDMT of torsemide  daily and spiro  daily. Intolerant of BB. Additional agents deferred at this time with borderline BP with titration of diltiazem as above.   # CAD s/p PCI mid LAD (2001)  Chest pain free. Continue aspirin  daily, atorvastatin.  This patient's plan of care was discussed and created with Dr. Juliann Pares and he is in agreement.  Signed: Rebeca Allegra , PA-C 06/29/2022, 10:21 AM [facility]  Cardiology

## 2022-06-29 NOTE — Inpatient Diabetes Management (Addendum)
Inpatient Diabetes Program Recommendations  AACE/ADA: New Consensus Statement on Inpatient Glycemic Control (2015)  Target Ranges:  Prepandial:   less than 140 mg/dL      Peak postprandial:   less than 180 mg/dL (1-2 hours)      Critically ill patients:  140 - 180 mg/dL   Lab Results  Component Value Date   GLUCAP 220 (H) 06/29/2022   HGBA1C 5.9 (H) 04/12/2022    Review of Glycemic Control  Latest Reference Range & Units 06/28/22 17:51 06/28/22 20:36 06/29/22 07:39 06/29/22 12:06  Glucose-Capillary 70 - 99 mg/dL 194 (H) 174 (H) 081 (H) 296 (H)   Diabetes history: DM 2 Outpatient Diabetes medications: Lantus 24 units, Novolog 50 units breakfast 60 units lunch 50 units supper Current orders for Inpatient glycemic control:  Semglee 15 units qhs Novolog 0-15 units tid Novolog 30 units tid meal coverage  Inpatient Diabetes Program Recommendations:    Note meal coverage insulin increased today  -   Consider increasing Semglee to 20 units  Thanks,  Christena Deem RN, MSN, BC-ADM Inpatient Diabetes Coordinator Team Pager [phone] (8a-5p)

## 2022-06-29 NOTE — Progress Notes (Signed)
PT Cancellation Note  Patient Details Name: [name] MRN: [id number] DOB: Nov 25, 1950   Cancelled Treatment:    Reason Eval/Treat Not Completed: Patient declined to participate with PT services this date secondary to fatigue.  Educated pt on physiological benefits of activity and encouragement provided but pt continued to decline. Pt stated "tomorrow is another day" and requested no return this date. Will attempt to see pt at a future date/time as medically appropriate.     Ovidio Hanger PT, DPT 06/29/22, 9:10 AM

## 2022-06-29 NOTE — Progress Notes (Signed)
PROGRESS NOTE    [name]  GYF:[id number] DOB: 1950-07-27 DOA: 06/28/2022 PCP: Andrew Baseman, MD  121A/121A-BB  LOS: 0 days   Brief hospital course:   Assessment & Plan: [name] is a 72 y.o. male with medical history significant of Stage IV melanoma with lung metastasis currently undergoing treatment, sCHF, DM2, CKD 3a who presented from home after not being able to get out of bed today.    # Dyspnea --No obvious etiology.  O2 sats 99-100% on room air and pt spoke full sentences with normal work of breathing.  Some cough but otherwise no obvious evidence of URI or PNA.  Left pleural effusion present, but appeared small.  Pt appears to have new-onset Afib w rate in 100's, maybe that's the cause of pt's subjective dyspnea? Plan: --treat Afib w RVR   # Weakness --in both legs, equal on both sides.  No focal neurological deficit.  Having trouble walking now.  May be due to deconditioning, active malignancy and treatment, but will continue medical workup. --PT   # Afib w RVR --pt is followed by cardio but no mention of prior hx of Afib.   --presented in Afib with rate of 100's.  Started dilt 60 mg q8h (pt is intolerant of BB, per cardio) Plan: --cardio consult today - continue diltiazem 60mg  q6h  - start amiodarone 200mg  BID x 14 days, then 200mg  daily thereafter for rhythm control  --Discharge home on eliquis 5mg  BID for stroke prophylaxis. CHADS2-Vasc 4 (age, HTN, CAD, DM2).    # DM2 --last A1c 5.9.  Pt reported having low BG in 40's and having to reduce home Lantus. Plan: --cont long-acting as reduced glargine 15u nightly --mealtime insulin at 30u TID --ACHS and SSI   # Stage IV melanoma with lung metastasis  --currently on treatment with [name]  Liver mass:  --[place] limited found a 2.1 cm mass in the right hepatic lobe that is incompletely characterized. PET scan results from May 12, 2022 did not reveal any liver pathology.  --oncology to order MRI  as an outpatient to further characterize.    # Chronic sCHF --does not appear grossly fluid overloaded.  Compliant with home torsemide and aldactone. --cont home torsemide and aldactone   Mild trop elevation --trop 84, 75, down from 1039 two months ago.  No chest pain.  Trop elevation likely just residual from 2 months ago.  No clinical significance.     # Hx of iron def anemia --follows with [name] --monitor Hgb and transfuse to keep Hgb >7   # Hx of CAD  s/p PCI mid LAD (2001)  --cont home ASA --resume statin after discharge   # CKD 3a   # Obesity, BMI 38   Hx of MGUS Hx of Nephrotic range proteinuria    DVT prophylaxis: Lovenox SQ Code Status: Full code  Family Communication: sons updated at bedside today Level of care: Med-Surg Dispo:   The patient is from: home Anticipated d/c is to: home Anticipated d/c date is: 1-2 days   Subjective and Interval History:  Pt reported dyspnea a little bit improved today.   Objective: Vitals:   06/29/22 0512 06/29/22 0736 06/29/22 1209 06/29/22 1638  BP: (!) 109/57 (!) 114/55 120/63 (!) 119/58  Pulse: 81 90 87 80  Resp: 16 19 19 18   Temp: 98.2 F (36.8 C) 98.2 F (36.8 C) 99 F (37.2 C) 98.3 F (36.8 C)  TempSrc: Oral     SpO2: 98% Marland Kitchen)  81% 97% 98%  Weight:      Height:        Intake/Output Summary (Last 24 hours) at 06/29/2022 1926 Last data filed at 06/29/2022 1052 Gross per 24 hour  Intake 480 ml  Output 250 ml  Net 230 ml   Filed Weights   06/28/22 1347 06/28/22 2001  Weight: 127.5 kg 129.6 kg    Examination:   Constitutional: NAD, AAOx3 HEENT: conjunctivae and lids normal, EOMI CV: No cyanosis.   RESP: normal respiratory effort, on RA Neuro: II - XII grossly intact.   Psych: Normal mood and affect.  Appropriate judgement and reason   Data Reviewed: I have personally reviewed labs and imaging studies  Time spent: 35 minutes  Andrew Priestly, MD [facility] If 7PM-7AM, please contact  night-coverage 06/29/2022, 7:26 PM

## 2022-06-29 NOTE — Plan of Care (Signed)

## 2022-06-30 DIAGNOSIS — I5030 Unspecified diastolic (congestive) heart failure: Secondary | ICD-10-CM | POA: Diagnosis not present

## 2022-06-30 DIAGNOSIS — I2489 Other forms of acute ischemic heart disease: Secondary | ICD-10-CM | POA: Diagnosis not present

## 2022-06-30 DIAGNOSIS — I2510 Atherosclerotic heart disease of native coronary artery without angina pectoris: Secondary | ICD-10-CM | POA: Diagnosis not present

## 2022-06-30 DIAGNOSIS — I480 Paroxysmal atrial fibrillation: Secondary | ICD-10-CM | POA: Diagnosis not present

## 2022-06-30 DIAGNOSIS — R0602 Shortness of breath: Secondary | ICD-10-CM | POA: Diagnosis not present

## 2022-06-30 LAB — GLUCOSE, CAPILLARY
Glucose-Capillary: 139 mg/dL — ABNORMAL HIGH (ref 70–99)
Glucose-Capillary: 183 mg/dL — ABNORMAL HIGH (ref 70–99)

## 2022-06-30 LAB — CBC
HCT: 23.8 % — ABNORMAL LOW (ref 39.0–52.0)
Hemoglobin: 7.6 g/dL — ABNORMAL LOW (ref 13.0–17.0)
MCH: 29.6 pg (ref 26.0–34.0)
MCHC: 31.9 g/dL (ref 30.0–36.0)
MCV: 92.6 fL (ref 80.0–100.0)
Platelets: 198 10*3/uL (ref 150–400)
RBC: 2.57 MIL/uL — ABNORMAL LOW (ref 4.22–5.81)
RDW: 16.4 % — ABNORMAL HIGH (ref 11.5–15.5)
WBC: 6.7 10*3/uL (ref 4.0–10.5)
nRBC: 0 % (ref 0.0–0.2)

## 2022-06-30 LAB — BASIC METABOLIC PANEL
Anion gap: 9 (ref 5–15)
BUN: 80 mg/dL — ABNORMAL HIGH (ref 8–23)
CO2: 26 mmol/L (ref 22–32)
Calcium: 8 mg/dL — ABNORMAL LOW (ref 8.9–10.3)
Chloride: 100 mmol/L (ref 98–111)
Creatinine, Ser: 2.02 mg/dL — ABNORMAL HIGH (ref 0.61–1.24)
GFR, Estimated: 34 mL/min — ABNORMAL LOW (ref >60)
Glucose, Bld: 133 mg/dL — ABNORMAL HIGH (ref 70–99)
Potassium: 4.6 mmol/L (ref 3.5–5.1)
Sodium: 135 mmol/L (ref 135–145)

## 2022-06-30 LAB — MAGNESIUM: Magnesium: 2.1 mg/dL (ref 1.7–2.4)

## 2022-06-30 LAB — CULTURE, BLOOD (ROUTINE X 2): Special Requests: ADEQUATE

## 2022-06-30 MED ORDER — APIXABAN 5 MG PO TABS: 5.0000 mg | ORAL_TABLET | Freq: Two times a day (BID) | ORAL | Status: DC

## 2022-06-30 MED ORDER — AMIODARONE HCL 200 MG PO TABS: 200.0000 mg | ORAL_TABLET | Freq: Two times a day (BID) | ORAL | 0 refills | Status: AC

## 2022-06-30 MED ORDER — AMIODARONE HCL 200 MG PO TABS: 200.0000 mg | ORAL_TABLET | Freq: Every day | ORAL | 2 refills | Status: DC

## 2022-06-30 MED ORDER — DILTIAZEM HCL ER COATED BEADS 180 MG PO CP24
180.0000 mg | ORAL_CAPSULE | Freq: Every day | ORAL | Status: DC
  Administered 2022-06-30: 180 mg via ORAL
  Filled 2022-06-30: qty 1

## 2022-06-30 MED ORDER — APIXABAN 5 MG PO TABS: 5.0000 mg | ORAL_TABLET | Freq: Two times a day (BID) | ORAL | 2 refills | Status: DC

## 2022-06-30 MED ORDER — SPIRONOLACTONE 50 MG PO TABS: ORAL_TABLET | ORAL | Status: DC

## 2022-06-30 MED ORDER — DILTIAZEM HCL ER COATED BEADS 180 MG PO CP24: 180.0000 mg | ORAL_CAPSULE | Freq: Every day | ORAL | 2 refills | Status: DC

## 2022-06-30 MED ORDER — INSULIN ASPART 100 UNIT/ML ~~LOC~~ SOLN: 30.0000 [IU] | Freq: Three times a day (TID) | SUBCUTANEOUS | 11 refills | Status: DC

## 2022-06-30 MED ORDER — DILTIAZEM HCL ER COATED BEADS 180 MG PO CP24: 180.0000 mg | ORAL_CAPSULE | Freq: Every day | ORAL | Status: DC

## 2022-06-30 MED ORDER — DILTIAZEM HCL 60 MG PO TABS: 120.0000 mg | ORAL_TABLET | Freq: Two times a day (BID) | ORAL | Status: DC

## 2022-06-30 MED ORDER — TORSEMIDE 100 MG PO TABS: ORAL_TABLET | ORAL | Status: DC

## 2022-06-30 MED ORDER — INSULIN GLARGINE 100 UNIT/ML ~~LOC~~ SOLN: 15.0000 [IU] | Freq: Every day | SUBCUTANEOUS | 11 refills | Status: DC

## 2022-06-30 NOTE — Evaluation (Signed)
Occupational Therapy Evaluation Patient Details Name: [name] MRN: [id number] DOB: [birth year] Today's Date: 06/30/2022   History of Present Illness Pt is a 72 y.o. male with medical history significant of Stage IV melanoma with lung metastasis currently undergoing treatment, sCHF, DM2, CKD 3a who presented from home after not being able to get out of bed.  MD assessment includes: dyspnea, weakness, A-fib with RVR, and mild trop elevation of no clinical significance.   Clinical Impression   Patient presenting with decreased Ind in self care, balance, functional mobility/transfers, endurance, and safety awareness. Patient reports being mod I with use of RW at baseline and living with family who are able to assist as needed at discharge. Patient currently functioning at min A for functional transfers and min - mod A for toileting needs and LB self care. Pt fatigues quickly with ambulation to bathroom and back to recliner chair. OT recommended continued use of RW at home as well as dicussed energy conservation strategies for safety and independence.  Patient will benefit from acute OT to increase overall independence in the areas of ADLs, functional mobility, and safety awareness in order to safely discharge.     Recommendations for follow up therapy are one component of a multi-disciplinary discharge planning process, led by the attending physician.  Recommendations may be updated based on patient status, additional functional criteria and insurance authorization.   Assistance Recommended at Discharge Intermittent Supervision/Assistance  Patient can return home with the following A little help with walking and/or transfers;A little help with bathing/dressing/bathroom;Assistance with cooking/housework;Assist for transportation;Help with stairs or ramp for entrance    Functional Status Assessment  Patient has had a recent decline in their functional status and demonstrates the ability to make  significant improvements in function in a reasonable and predictable amount of time.  Equipment Recommendations  None recommended by OT       Precautions / Restrictions Precautions Precautions: Fall Restrictions Weight Bearing Restrictions: No      Mobility Bed Mobility               General bed mobility comments: seated in recliner chair    Transfers Overall transfer level: Needs assistance Equipment used: Rolling walker (2 wheels) Transfers: Sit to/from Stand Sit to Stand: Min assist           General transfer comment: from recliner chair      Balance Overall balance assessment: Needs assistance Sitting-balance support: Feet supported Sitting balance-Leahy Scale: Normal     Standing balance support: Bilateral upper extremity supported, During functional activity, Reliant on assistive device for balance Standing balance-Leahy Scale: Fair                             ADL either performed or assessed with clinical judgement   ADL Overall ADL's : Needs assistance/impaired     Grooming: Wash/dry hands;Wash/dry face;Standing;Min guard                   Toilet Transfer: Minimal assistance;Regular Toilet;Rolling walker (2 wheels);Grab bars   Toileting- Clothing Manipulation and Hygiene: Moderate assistance Toileting - Clothing Manipulation Details (indicate cue type and reason): assistance with clothing management and hygiene     Functional mobility during ADLs: Min guard;Rolling walker (2 wheels)       Vision Patient Visual Report: No change from baseline              Pertinent Vitals/Pain Pain Assessment Pain Assessment: No/denies  pain     Hand Dominance Right   Extremity/Trunk Assessment Upper Extremity Assessment Upper Extremity Assessment: Generalized weakness   Lower Extremity Assessment Lower Extremity Assessment: Generalized weakness       Communication Communication Communication: No difficulties   Cognition  Arousal/Alertness: Awake/alert Behavior During Therapy: WFL for tasks assessed/performed Overall Cognitive Status: Within Functional Limits for tasks assessed                                       General Comments       Exercises     Shoulder Instructions      Home Living Family/patient expects to be discharged to:: Private residence Living Arrangements: Children Available Help at Discharge: Family;Available 24 hours/day Type of Home: House Home Access: Stairs to enter [employer] of Steps: 4 Entrance Stairs-Rails: Right;Left;Can reach both Home Layout: One level     Bathroom Shower/Tub: Producer, television/film/video: Standard     Home Equipment: Grab bars - toilet;Grab bars - tub/shower;Tub bench;Other (comment);BSC/3in1;Rolling Walker (2 wheels)   Additional Comments: Lives with son      Prior Functioning/Environment Prior Level of Function : Independent/Modified Independent;Driving             Mobility Comments: Mod Ind amb with a RW limited community distances ADLs Comments: Ind with ADLs        OT Problem List: Decreased strength;Decreased activity tolerance;Decreased safety awareness;Impaired balance (sitting and/or standing);Decreased knowledge of use of DME or AE      OT Treatment/Interventions: Self-care/ADL training;Therapeutic exercise;Therapeutic activities;Energy conservation;DME and/or AE instruction;Patient/family education;Balance training    OT Goals(Current goals can be found in the care plan section) Acute Rehab OT Goals Patient Stated Goal: to return home OT Goal Formulation: With patient Time For Goal Achievement: 07/14/22 Potential to Achieve Goals: Fair ADL Goals Pt Will Perform Grooming: with modified independence;standing Pt Will Perform Lower Body Dressing: with supervision;sit to/from stand Pt Will Transfer to Toilet: with supervision;ambulating Pt Will Perform Toileting - Clothing Manipulation and  hygiene: with supervision;sit to/from stand  OT Frequency: Min 1X/week    Co-evaluation              AM-PAC OT "6 Clicks" Daily Activity     Outcome Measure Help from another person eating meals?: None Help from another person taking care of personal grooming?: A Little Help from another person toileting, which includes using toliet, bedpan, or urinal?: A Lot Help from another person bathing (including washing, rinsing, drying)?: A Lot Help from another person to put on and taking off regular upper body clothing?: None Help from another person to put on and taking off regular lower body clothing?: A Lot 6 Click Score: 17   End of Session Equipment Utilized During Treatment: Rolling walker (2 wheels) Nurse Communication: Mobility status  Activity Tolerance: Patient tolerated treatment well Patient left: in chair;with call bell/phone within reach;with family/visitor present  OT Visit Diagnosis: Unsteadiness on feet (R26.81);Repeated falls (R29.6);Muscle weakness (generalized) (M62.81)                Time: 5909-3112 OT Time Calculation (min): 19 min Charges:  OT General Charges $OT Visit: 1 Visit OT Evaluation $OT Eval Moderate Complexity: 1 Mod OT Treatments $Self Care/Home Management : 8-22 mins  Jackquline Denmark, MS, OTR/L , CBIS ascom [phone]  06/30/22, 2:52 PM

## 2022-06-30 NOTE — TOC Progression Note (Signed)
Transition of Care [facility]) - Progression Note    Patient Details  Name: [name] MRN: [id number] Date of Birth: 18-Oct-1950  Transition of [facility]) CM/SW Contact  Allena Katz, LCSW Phone Number: 06/30/2022, 10:04 AM  Clinical Narrative:     CSW attempted to deliver MOON but unable to reach son and patient in currently with PT.       Expected Discharge Plan and Services                                               Social Determinants of Health (SDOH) Interventions SDOH Screenings   Food Insecurity: No Food Insecurity (06/28/2022)  Housing: Low Risk  (06/28/2022)  Transportation Needs: No Transportation Needs (06/28/2022)  Utilities: Not At Risk (06/28/2022)  Depression (PHQ2-9): Low Risk  (02/27/2022)  Recent Concern: Depression (PHQ2-9) - Medium Risk (02/02/2022)  Tobacco Use: Low Risk  (06/28/2022)    Readmission Risk Interventions     No data to display

## 2022-06-30 NOTE — Evaluation (Signed)
Physical Therapy Evaluation Patient Details Name: [name] MRN: [id number] DOB: 1951/01/28 Today's Date: 06/30/2022  History of Present Illness  Pt is a 72 y.o. male with medical history significant of Stage IV melanoma with lung metastasis currently undergoing treatment, sCHF, DM2, CKD 3a who presented from home after not being able to get out of bed.  MD assessment includes: dyspnea, weakness, A-fib with RVR, and mild trop elevation of no clinical significance.   Clinical Impression  Pt was pleasant and motivated to participate during the session and put forth good effort throughout. Pt required no physical assistance during the session but did need extra time and effort to complete functional tasks and required cuing for proper use of the RW for general safety.  Pt reported no adverse symptoms during the session and was generally steady with standing activities with no overt LOB.  Pt will benefit from continued PT services upon discharge to safely address deficits listed in patient problem list for decreased caregiver assistance and eventual return to PLOF.          Recommendations for follow up therapy are one component of a multi-disciplinary discharge planning process, led by the attending physician.  Recommendations may be updated based on patient status, additional functional criteria and insurance authorization.  Follow Up Recommendations       Assistance Recommended at Discharge Intermittent Supervision/Assistance  Patient can return home with the following  A little help with walking and/or transfers;A little help with bathing/dressing/bathroom;Assistance with cooking/housework;Help with stairs or ramp for entrance;Assist for transportation    Equipment Recommendations None recommended by PT  Recommendations for Other Services       Functional Status Assessment Patient has had a recent decline in their functional status and demonstrates the ability to make significant  improvements in function in a reasonable and predictable amount of time.     Precautions / Restrictions Precautions Precautions: Fall Restrictions Weight Bearing Restrictions: No      Mobility  Bed Mobility Overal bed mobility: Modified Independent             General bed mobility comments: Min extra time and effort only    Transfers Overall transfer level: Needs assistance Equipment used: Rolling walker (2 wheels) Transfers: Sit to/from Stand Sit to Stand: From elevated surface, Min guard           General transfer comment: Min extra effort to come to standing from an elevated EOB but no physical assist needed    Ambulation/Gait Ambulation/Gait assistance: Min guard Gait Distance (Feet): 15 Feet x 1, 20 Feet x 1 Assistive device: Rolling walker (2 wheels) Gait Pattern/deviations: Step-through pattern, Decreased step length - right, Decreased step length - left, Trunk flexed Gait velocity: decreased     General Gait Details: Mod verbal cues for amb closer to the RW with upright posture but generally steady with no overt LOB  Stairs            Wheelchair Mobility    Modified Rankin (Stroke Patients Only)       Balance Overall balance assessment: Needs assistance   Sitting balance-Leahy Scale: Normal     Standing balance support: Bilateral upper extremity supported, During functional activity, Reliant on assistive device for balance Standing balance-Leahy Scale: Fair                               Pertinent Vitals/Pain Pain Assessment Pain Assessment: No/denies pain  Home Living Family/patient expects to be discharged to:: Private residence Living Arrangements: Children Available Help at Discharge: Family;Available 24 hours/day Type of Home: House Home Access: Stairs to enter Entrance Stairs-Rails: Right;Left;Can reach both Entrance Stairs-Number of Steps: 4   Home Layout: One level Home Equipment: Grab bars - toilet;Grab  bars - tub/shower;Tub bench;Other (comment);BSC/3in1;Rolling Walker (2 wheels) Additional Comments: Lives with son    Prior Function               Mobility Comments: Mod Ind amb with a RW limited community distances ADLs Comments: Ind with ADLs     Hand Dominance   Dominant Hand: Right    Extremity/Trunk Assessment   Upper Extremity Assessment Upper Extremity Assessment: Generalized weakness    Lower Extremity Assessment Lower Extremity Assessment: Generalized weakness       Communication   Communication: No difficulties  Cognition Arousal/Alertness: Awake/alert Behavior During Therapy: WFL for tasks assessed/performed Overall Cognitive Status: Within Functional Limits for tasks assessed                                          General Comments      Exercises Other Exercises Other Exercises: Sit to/from stand transfers from various height surfaces   Assessment/Plan    PT Assessment Patient needs continued PT services  PT Problem List Decreased strength;Decreased activity tolerance;Decreased balance;Decreased mobility;Decreased knowledge of use of DME       PT Treatment Interventions DME instruction;Gait training;Stair training;Functional mobility training;Therapeutic activities;Therapeutic exercise;Balance training;Patient/family education    PT Goals (Current goals can be found in the Care Plan section)  Acute Rehab PT Goals Patient Stated Goal: To return home PT Goal Formulation: With patient Time For Goal Achievement: 07/13/22 Potential to Achieve Goals: Good    Frequency Min 3X/week     Co-evaluation               AM-PAC PT "6 Clicks" Mobility  Outcome Measure Help needed turning from your back to your side while in a flat bed without using bedrails?: A Little Help needed moving from lying on your back to sitting on the side of a flat bed without using bedrails?: A Little Help needed moving to and from a bed to a chair  (including a wheelchair)?: A Little Help needed standing up from a chair using your arms (e.g., wheelchair or bedside chair)?: A Little Help needed to walk in hospital room?: A Little Help needed climbing 3-5 steps with a railing? : A Little 6 Click Score: 18    End of Session Equipment Utilized During Treatment: Gait belt Activity Tolerance: Patient tolerated treatment well Patient left: in chair;with call bell/phone within reach;with chair alarm set Nurse Communication: Mobility status PT Visit Diagnosis: Difficulty in walking, not elsewhere classified (R26.2);Muscle weakness (generalized) (M62.81)    Time: 1000-1028 PT Time Calculation (min) (ACUTE ONLY): 28 min   Charges:   PT Evaluation $PT Eval Moderate Complexity: 1 Mod         D. Scott Nohely Whitehorn PT, DPT 06/30/22, 11:41 AM

## 2022-06-30 NOTE — Progress Notes (Signed)
[facility] CLINIC CARDIOLOGY CONSULT NOTE       Patient ID: DREXLER FALCON MRN: [id number] DOB/AGE: 05-14-1950 72 y.o.  Admit date: 06/28/2022 Referring Physician Dr. Darlin Priestly  Primary Physician Dr. Terance Hart Primary Cardiologist Dr. Tiajuana Amass  Reason for Consultation new onset AF   HPI: Anne Hahn "Fayrene Fearing" Behar is a 72yoM with a PMH of 72yoM with a PMH of HFmrEF (45-50%, global hypo, mod LVH, g2dd 03/2022), CAD s/p PCI mid LAD (2001), HLD, DM2, IDA, idiopathic membranous glomerulonephritis w/ nephrotic syndrome, recurrent bilateral pleural effusions, malignant melanoma s/p excision (2018) with recurrence to left lung (2024, currently undergoing immunotherapy) who presented to [facility] ED 06/28/2022 with significant dyspnea and weakness x 1 week, worsening 3 days prior to presentation.  EKG on admission showed atrial fibrillation with rate of 102 bpm.  Cardiology is consulted for further assistance.  Interval History:  - feels remarkably better today, able to ambulate from chair to the restroom (could not do this yesterday). More energy in general  - no chest pain or dyspnea. No palpitations or heart racing.  - converted from AF to NSR with some PACs this AM. Rate controlled in the 80s   Review of systems complete and found to be negative unless listed above     Past Medical History:  Diagnosis Date   Aortic atherosclerosis    Arthritis    Bilateral carotid artery stenosis    a.) carotid doppler 05/07/2022: 1-39% BICA   CHF (congestive heart failure), NYHA class II, acute on chronic, systolic    a.) TTE 01/09/2015: EF 35%, LVH, inf HK, LAE, triv TR/PR, mild MR, G1DD; b.) TTE 04/09/2017: EF 45%, mod LVH, post HK, triv MR/TR; c.) TTE 07/26/2018: EF 40%, LVH, inf/post HK, LAE, RVE, triv-mild panval regurg, G1DD; d.) TTE 06/01/2019: EF 40%, LVH, api/inf HK, BAE, triv MR/TR/PR, G1DD; e.) TTE 05/20/2021: EF 45%, LVH, LAE, triv MR/TR/PR; f.) TTE 04/12/2022: EF 45-50%, glob HK, BAE, triv MR, G2DD    Chronic kidney disease (CKD), stage II (mild)    Chronic membranous glomerulonephritis    Coronary artery disease    a.) LHC 05/02/1999: 25/25/75% mLAD --> 2.5 x 18 mm Guidant/ACE OTW Tetra   DDD (degenerative disc disease), lumbar    Diabetic neuropathy    Dysplastic nevus 08/26/2020   L flank, moderate atypia   Dyspnea on exertion    Elevated troponin    History of bilateral cataract extraction    Hyperlipidemia    Hypertension    Iron deficiency anemia    Ischemic cardiomyopathy    Lymphedema    Melanoma 08/20/2015   Right neck. Superficial spreading, arising in nevus. Tumor thickness 1.64mm, Anatomic level IV   Melanoma in situ 05/22/2020   R lateral neck ant to scar, exc 05/22/20   Osteomyelitis of great toe of left foot    Pulmonary mass 04/23/2022   a.)  CT chest 04/23/2022: LLL mass suspicious for bronchogenic neoplasm with associated peripheral changes; b.)  PET CT 05/12/2022: Hypermetabolic LEFT lower lobe mass measuring 8.5 x 4.6 cm (SUV max 23) disease; second foci of metabolic activity slightly more inferior with similar SUV intensity.   RBBB (right bundle branch block)    Recurrent left pleural effusion    Skin cancer    removed l arm   Sleep apnea    a.) s/p uvulectomy 1990   Spinal stenosis of lumbar region with radiculopathy    Swelling of both lower extremities    T2DM (type 2 diabetes mellitus)  a.) uses Dexcom G6 CGM   Venous ulcer of ankle     Past Surgical History:  Procedure Laterality Date   CATARACT EXTRACTION W/PHACO Right 05/03/2019   Procedure: CATARACT EXTRACTION PHACO AND INTRAOCULAR LENS PLACEMENT (IOC) RIGHT DIABETIC 6.44 00:58.4 11.0%;  Surgeon: Lockie Mola, MD;  Location: [facility];  Service: Ophthalmology;  Laterality: Right;  DIABETIC   CATARACT EXTRACTION W/PHACO Left 06/28/2019   Procedure: CATARACT EXTRACTION PHACO AND INTRAOCULAR LENS PLACEMENT (IOC) LEFT DIABETIC 7.99  01:13.4  10.9% ;  Surgeon: Lockie Mola, MD;  Location: [facility];  Service: Ophthalmology;  Laterality: Left;  Diabetic - insulin and oral meds   CHOLECYSTECTOMY  1985   CORONARY ANGIOPLASTY WITH STENT PLACEMENT Left 05/02/1999   LUMBAR LAMINECTOMY/DECOMPRESSION MICRODISCECTOMY  04/13/2012   Procedure: LUMBAR LAMINECTOMY/DECOMPRESSION MICRODISCECTOMY 1 LEVEL;  Surgeon: Javier Docker, MD;  Location: [facility];  Service: Orthopedics;  Laterality: Bilateral;  L4-L5   TOE AMPUTATION Left    big toe   TOTAL KNEE ARTHROPLASTY Right 2007   UVULECTOMY  11/14/1988   VIDEO BRONCHOSCOPY WITH ENDOBRONCHIAL ULTRASOUND N/A 05/15/2022   Procedure: VIDEO BRONCHOSCOPY WITH ENDOBRONCHIAL ULTRASOUND;  Surgeon: Vida Rigger, MD;  Location: [facility];  Service: Thoracic;  Laterality: N/A;    Medications Prior to Admission  Medication Sig Dispense Refill Last Dose   aspirin EC 81 MG tablet Take 162 mg by mouth daily. Swallow whole.   06/27/2022 at 2000   atorvastatin (LIPITOR) 80 MG tablet Take 80 mg by mouth at bedtime.   06/27/2022 at 2000   ferrous sulfate (FEROSUL) 325 (65 FE) MG tablet Take 325 mg by mouth 2 (two) times daily with a meal.   06/27/2022 at 2000   insulin aspart (NOVOLOG) 100 UNIT/ML injection Inject into the skin 3 (three) times daily before meals.   06/27/2022 at 2000   insulin glargine (LANTUS) 100 UNIT/ML injection Inject 24 Units into the skin at bedtime.   06/27/2022 at 2000   prochlorperazine (COMPAZINE) 10 MG tablet Take 1 tablet (10 mg total) by mouth every 6 (six) hours as needed for nausea or vomiting. 30 tablet 1 unk   spironolactone (ALDACTONE) 50 MG tablet Take 50 mg by mouth daily.   06/27/2022   tacrolimus (PROGRAF) 1 MG capsule Take 1 mg by mouth 2 (two) times daily.   06/27/2022   torsemide (DEMADEX) 100 MG tablet Take 100 mg by mouth every morning.   06/27/2022   Vitamin D, Ergocalciferol, (DRISDOL) 1.25 MG (50000 UNIT) CAPS capsule Take 1 capsule (50,000 Units total) by mouth every 7 (seven) days.  (Patient taking differently: Take 50,000 Units by mouth every 7 (seven) days. Fridays) 12 capsule 0 Past Month   Continuous Blood Gluc Receiver (DEXCOM G6 RECEIVER) DEVI Use to monitor blood sugar. NDC [phone]      Continuous Blood Gluc Sensor (DEXCOM G6 SENSOR) MISC Use to monitor blood sugar.  Replace every 10 days. NDC [phone].      Continuous Blood Gluc Transmit (DEXCOM G6 TRANSMITTER) MISC Use to monitor blood sugar.  Replace every 3 months. NDC [phone].      Insulin Pen Needle (COMFORT EZ PEN NEEDLES) 32G X 8 MM MISC See admin instructions.      tamsulosin (FLOMAX) 0.4 MG CAPS capsule Take 1 capsule (0.4 mg total) by mouth daily after supper. (Patient not taking: Reported on 06/28/2022) 30 capsule 11 Not Taking   Social History   Socioeconomic History   Marital status: Widowed    Spouse name:  Not on file   Number of children: Not on file   Years of education: Not on file   Highest education level: Not on file  Occupational History   Not on file  Tobacco Use   Smoking status: Never   Smokeless tobacco: Never  Vaping Use   Vaping Use: Never used  Substance and Sexual Activity   Alcohol use: Yes    Comment: rare   Drug use: No   Sexual activity: Not on file  Other Topics Concern   Not on file  Social History Narrative   Not on file   Social Determinants of Health   Financial Resource Strain: Not on file  Food Insecurity: No Food Insecurity (06/28/2022)   Hunger Vital Sign    [name]: Never true    [name]: Never true  Transportation Needs: No Transportation Needs (06/28/2022)   PRAPARE - Administrator, Civil Service (Medical): No    Lack of Transportation (Non-Medical): No  Physical Activity: Not on file  Stress: Not on file  Social Connections: Not on file  Intimate Partner Violence: Not At Risk (06/28/2022)   Humiliation, Afraid, Rape, and Kick questionnaire    Fear of  Current or Ex-Partner: No    Emotionally Abused: No    Physically Abused: No    Sexually Abused: No    Family History  Problem Relation Age of Onset   Cancer Mother       Intake/Output Summary (Last 24 hours) at 06/30/2022 0845 Last data filed at 06/29/2022 1052 Gross per 24 hour  Intake 480 ml  Output --  Net 480 ml     Vitals:   06/29/22 2138 06/29/22 2326 06/30/22 0432 06/30/22 0804  BP: (!) 121/46 (!) 102/48 (!) 119/55 (!) 120/57  Pulse: 79 77 74 77  Resp: 19 19 20 19   Temp: 98.7 F (37.1 C) 98.7 F (37.1 C) 98.7 F (37.1 C) 97.7 F (36.5 C)  TempSrc:      SpO2: 92% 97% 97% 97%  Weight:      Height:        PHYSICAL EXAM General: elderly caucasian male, fatigued appearing. Sitting up in recliner with son and hospital volunteer at bedside HEENT:  Normocephalic and atraumatic. Neck:  No JVD.  Lungs: Normal respiratory effort on room air.  Decreased breath sounds bilaterally, reduced in left base.  Without appreciable crackles or wheezes. Heart: regular rate and rhythm. Normal S1 and S2 without gallops or murmurs.  Abdomen: Non-distended appearing with excess adiposity .  Msk: Normal strength and tone for age. Extremities: No clubbing, cyanosis.  Significant dryness and scaling of the skin of both shins.  2-3+ bilateral lower extremity edema.  Neuro: Alert and oriented X 3. Psych:  Answers questions appropriately.   Labs: Basic Metabolic Panel: Recent Labs    06/29/22 0317 06/30/22 0515  NA 132* 135  K 4.6 4.6  CL 100 100  CO2 25 26  GLUCOSE 226* 133*  BUN 77* 80*  CREATININE 1.92* 2.02*  CALCIUM 7.9* 8.0*  MG 2.2 2.1    Liver Function Tests: Recent Labs    06/28/22 1350  AST 44*  ALT 62*  ALKPHOS 274*  BILITOT 2.2*  PROT 6.8  ALBUMIN 2.5*    No results for input(s): "LIPASE", "AMYLASE" in the last 72 hours. CBC: Recent Labs    06/29/22 0317 06/30/22 0515  WBC 5.8 6.7  HGB 7.4* 7.6*  HCT 23.4* 23.8*  MCV 93.2 92.6  PLT 180 198     Cardiac Enzymes: Recent Labs    06/28/22 1349 06/28/22 1533  TROPONINIHS 84* 75*    BNP: Recent Labs    06/28/22 1350  BNP 1,068.8*    D-Dimer: No results for input(s): "DDIMER" in the last 72 hours. Hemoglobin A1C: No results for input(s): "HGBA1C" in the last 72 hours. Fasting Lipid Panel: No results for input(s): "CHOL", "HDL", "LDLCALC", "TRIG", "CHOLHDL", "LDLDIRECT" in the last 72 hours. Thyroid Function Tests: No results for input(s): "TSH", "T4TOTAL", "T3FREE", "THYROIDAB" in the last 72 hours.  Invalid input(s): "FREET3" Anemia Panel: Recent Labs    06/28/22 1533  FOLATE 12.8      Radiology: US Abdomen Limited RUQ (LIVER/GB)  Result Date: 06/28/2022 CLINICAL DATA:  Elevated LFTs, history of cholecystectomy EXAM: ULTRASOUND ABDOMEN LIMITED RIGHT UPPER QUADRANT COMPARISON:  Abdominal ultrasound April 12, 2022 FINDINGS: Gallbladder: The gallbladder is surgically absent. No sonographic Murphy's sign is noted by the sonographer. Common bile duct: Diameter: 5 mm Liver: There is a 2.1 x 1.4 x 2.1 cm echogenic mass in the right hepatic lobe. The hepatic parenchymal echogenicity is within normal limits. Portal vein is patent on color Doppler imaging with normal direction of blood flow towards the liver. Other: None. IMPRESSION: 1. No etiology for elevated LFTs is identified. 2. Incidentally visualized 2.1 cm echogenic mass in the right hepatic lobe, which is incompletely characterized. Recommend further assessment with abdominal MRI in the non-emergent setting. Electronically Signed   By: Jacob Moores M.D.   On: 06/28/2022 17:31   DG Chest 2 View  Result Date: 06/28/2022 CLINICAL DATA:  Shortness of breath, fatigue. EXAM: CHEST - 2 VIEW COMPARISON:  Chest x-ray dated 05/15/2022 FINDINGS: Stable cardiomegaly. Persistent opacity at the LEFT lung base. RIGHT lung is clear. Pneumothorax is seen. No acute-appearing osseous abnormality. IMPRESSION: 1. Persistent opacity  at the LEFT lung base, corresponding to the previously documented LEFT breast mass and adjacent pleural effusion. 2. Stable cardiomegaly. Electronically Signed   By: Bary Richard M.D.   On: 06/28/2022 14:40   MR Brain W Wo Contrast  Result Date: 06/17/2022 CLINICAL DATA:  Staging for metastatic melanoma. EXAM: MRI HEAD WITHOUT AND WITH CONTRAST TECHNIQUE: Multiplanar, multiecho pulse sequences of the brain and surrounding structures were obtained without and with intravenous contrast. CONTRAST:  10mL GADAVIST GADOBUTROL 1 MMOL/ML IV SOLN COMPARISON:  None FINDINGS: Brain: No acute infarct, hemorrhage, or mass lesion is present. No significant white matter lesions are present. The ventricles are of normal size. No significant extraaxial fluid collection is present. The brainstem and cerebellum are within normal limits. The internal auditory canals are within normal limits. Postcontrast images demonstrate no pathologic enhancement. Vascular: Flow is present in the major intracranial arteries. Skull and upper cervical spine: The craniocervical junction is normal. Upper cervical spine is within normal limits. Marrow signal is unremarkable. Sinuses/Orbits: Mild mucosal thickening is present in the inferior left maxillary sinus. The paranasal sinuses and mastoid air cells are otherwise clear. Bilateral lens replacements are noted. Globes and orbits are otherwise unremarkable. IMPRESSION: Negative MRI of the brain. No evidence for metastatic disease to the brain. Electronically Signed   By: Marin Roberts M.D.   On: 06/17/2022 17:39    ECHO 45-50%, global hypo, mod LVH, g2dd 03/2022  TELEMETRY reviewed by me (LT) 06/30/2022 : yesterday PM AF rate 70s 80s, converted to NSR this AM with rate 70s-80s  EKG reviewed by  me:  AF with rate 102 bpm and chronic RBBB  Data reviewed by me (LT) 06/30/2022: hospitalist progress note, nursing notes, last 24h vitals tele labs imaging I/O   Principal Problem:    Dyspnea Active Problems:   Metastatic melanoma    ASSESSMENT AND PLAN:  Anne Hahn "Fayrene Fearing" Nasser is a 72yoM with a PMH of 72yoM with a PMH of HFmrEF (45-50%, global hypo, mod LVH, g2dd 03/2022), CAD s/p PCI mid LAD (2001), HLD, DM2, IDA, 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  EKG on admission showed atrial fibrillation with rate of 102 bpm.  Cardiology is consulted for further assistance.  # Generalized weakness # new onset paroxysmal atrial fibrillation Converted to NSR this AM. Patient feeling remarkably better, more energy, etc.  - continue tele monitoring while hospitalized - continue consolidate cardizem to 120 BID today, then CD dosing at  daily tomorrow. - continue amiodarone  BID x 14 days, then  daily thereafter for rhythm control - change lovenox to eliquis  BID for stroke prophylaxis. CHADS2-Vasc 4 (age, HTN, CAD, DM2).   # Stage IV malignant melanoma, recurrent to the left lung (2024) Undergoing immunotherapy by Dr. Orlie Dakin, last treatment 3 weeks ago  # anemia of chronic disease H/H stable 7.4 -- 7.6.  No obvious bleeding (melena, hematochezia) continue to monitor closely   # demand supply ischemia Troponins borderline elevated and flat trending at 84, 75. In absence of chest pain or EKG changes, this is  most consistent with demand/supply mismatch and not ACS   # chronic HFmrEF  45-50% by echo in jan 2024. On GDMT of torsemide  daily and spiro  daily, which were held by primary team d/t uptrending Cr. If Cr stable tomorrow, consider restarting both meds at half normal dosing. Intolerant of BB. Additional agents deferred at this time with borderline BP with  titration of diltiazem as above.   # CAD s/p PCI mid LAD (2001)  Chest pain free. Continue aspirin  daily, atorvastatin.  Ok for discharge today from a cardiac perspective. Will arrange for follow up in clinic with Dr. Tiajuana Amass in 1-2 weeks.    This patient's plan of care was discussed and created with Dr. Juliann Pares and he is in agreement.  Signed: Rebeca Allegra , PA-C 06/30/2022, 8:45 AM [facility]

## 2022-06-30 NOTE — Discharge Summary (Signed)
Physician Discharge Summary   [name]  male DOB: 1950-11-25  UJW:[id number]  PCP: Andrew Baseman, MD  Admit date: 06/28/2022 Discharge date: 06/30/2022  Admitted From: home Disposition:  home Home Health: Yes CODE STATUS: Full code  Discharge Instructions     Discharge instructions   Complete by: As directed    Your shortness of breath and weakness appeared to have been caused by new onset Afib with rapid heart rate.  Cardiology has started you on Cardizem (start taking tomorrow) and amiodarone to control your Afib.  For amiodarone, please take 200 mg twice a day until 07/06/22 when you start taking it once a day.  You are also started on blood thinner Eliquis for stroke prevention.  Now that you are on Eliquis, please stop taking your aspirin 162 mg daily.  Because your kidney function declined some, I am holding your torsemide and aldactone.  Please see your cardiologist 1 week after discharge to check kidney function and have your cardiologist determine if/when to go back on your torsemide and aldactone.  Based on your insulin usage in the hospital, I have reduced your Lantus to 15 units nightly, and reduce your mealtime insulin to 30 units with each meal.   Dr. Darlin Priestly [facility] Course:  For full details, please see H&P, progress notes, consult notes and ancillary notes.  Briefly,  [name] is a 72 y.o. male with medical history significant of Stage IV melanoma with lung metastasis currently undergoing treatment, sCHF, DM2, CKD 3a who presented from home after not being able to get out of bed today.    # Dyspnea --No obvious etiology.  O2 sats 99-100% on room air and pt spoke full sentences with normal work of breathing.  Some cough but otherwise no obvious evidence of URI or PNA.  Left pleural effusion present, but appeared small.  Pt appears to have new-onset Afib w rate in 100's, which was likely the etiology of pt's dyspnea as dyspnea resolved  after heart rhythm converted back to NSR.   # Weakness --in both legs, equal on both sides.  No focal neurological deficit.  Having trouble walking PTA.  Weakness also resolved after treatment of Afib.  PT cleared pt to go home with [facility].   # Afib w RVR --pt is followed by cardio but no mention of prior hx of Afib.   --presented in Afib with rate of 100's.  Started dilt 60 mg q8h (pt is intolerant of BB, per cardio) and amiodarone by cardio. --pt was started amiodarone 200mg  BID x 14 days, then 200mg  daily thereafter for rhythm control  --rhythm converted to NSR the next day.   --Pt was also discharged on Cardizem 180 mg daily and Eliquis. --outpatient f/u with cardiology.   # DM2 --last A1c 5.9.  Pt reported having low BG in 40's and having to reduce home Lantus. --reduced home Lantus to 15 units nightly, and reduced mealtime insulin to 30 units with each meal.    # Stage IV melanoma with lung metastasis  --currently on treatment with [name]   Liver mass:  --[place] limited found a 2.1 cm mass in the right hepatic lobe that is incompletely characterized. PET scan results from May 12, 2022 did not reveal any liver pathology.  --oncology to order MRI as an outpatient to further characterize.    # Chronic sCHF --does not appear grossly fluid overloaded.  Compliant with home torsemide and aldactone. --  home torsemide and aldactone held due to worsening kidney function, to be followed up by outpatient cardiology in 1 week after discharge to determine if/when to resume.   Mild trop elevation --trop 84, 75, down from 1039 two months ago.  No chest pain.  Trop elevation likely just residual from 2 months ago.  No clinical significance.     # Hx of iron def anemia --follows with [name] --cont iron supppl   # Hx of CAD  s/p PCI mid LAD (2001)  --d/c home ASA 162 mg daily now that pt is started on Eliquis --resume statin after discharge   # CKD 3a   # Obesity, BMI 38   Hx  of MGUS Hx of Nephrotic range proteinuria    Unless noted above, medications under "STOP" list are ones pt was not taking PTA.  Discharge Diagnoses:  Principal Problem:   Dyspnea Active Problems:   Metastatic melanoma     Discharge Instructions:  Allergies as of 06/30/2022       Reactions   Carvedilol Itching, Other (See Comments), Swelling   Pain, itching and swelling beneath scrotum to anus.   Metoprolol Other (See Comments)   Cant remember Cant remember  Cant remember  Cant remember   Toprol Xl [metoprolol Tartrate] Other (See Comments)   Cant remember   Buprenorphine Itching, Rash   Buprenorphine Hcl Itching, Rash   Morphine Itching, Other (See Comments)   Morphine And Related Itching, Rash, Other (See Comments)   Triamcinolone Rash   Triamcinolone Acetonide Rash        Medication List     STOP taking these medications    aspirin EC 81 MG tablet   tacrolimus 1 MG capsule Commonly known as: PROGRAF   tamsulosin 0.4 MG Caps capsule Commonly known as: FLOMAX       TAKE these medications    amiodarone 200 MG tablet Commonly known as: PACERONE Take 1 tablet (200 mg total) by mouth 2 (two) times daily for 6 days.   amiodarone 200 MG tablet Commonly known as: PACERONE Take 1 tablet (200 mg total) by mouth daily. Start taking on: July 06, 2022   apixaban 5 MG Tabs tablet Commonly known as: ELIQUIS Take 1 tablet (5 mg total) by mouth 2 (two) times daily.   atorvastatin 80 MG tablet Commonly known as: LIPITOR Take 80 mg by mouth at bedtime.   Comfort EZ Pen Needles 32G X 8 MM Misc Generic drug: Insulin Pen Needle See admin instructions.   Dexcom G6 Receiver Devi Use to monitor blood sugar. NDC [phone]   Dexcom G6 Sensor Misc Use to monitor blood sugar.  Replace every 10 days. NDC [phone].   Dexcom G6 Transmitter Misc Use to monitor blood sugar.  Replace every 3 months. NDC [phone].   diltiazem 180 MG 24 hr  capsule Commonly known as: CARDIZEM CD Take 1 capsule (180 mg total) by mouth daily. Start taking on: July 01, 2022   FeroSul 325 (65 FE) MG tablet Generic drug: ferrous sulfate Take 325 mg by mouth 2 (two) times daily with a meal.   insulin aspart 100 UNIT/ML injection Commonly known as: novoLOG Inject 30 Units into the skin 3 (three) times daily before meals. Reduced from 50 u. What changed:  how much to take additional instructions   insulin glargine 100 UNIT/ML injection Commonly known as: LANTUS Inject 0.15 mLs (15 Units total) into the skin at bedtime. Reduced from 24 u. What changed:  how much to take  additional instructions   prochlorperazine 10 MG tablet Commonly known as: COMPAZINE Take 1 tablet (10 mg total) by mouth every 6 (six) hours as needed for nausea or vomiting.   spironolactone 50 MG tablet Commonly known as: ALDACTONE Hold until followup with cardiology due to kidney function. What changed:  how much to take how to take this when to take this additional instructions   torsemide 100 MG tablet Commonly known as: DEMADEX Hold until followup with cardiology due to kidney function. What changed:  how much to take how to take this when to take this additional instructions   Vitamin D (Ergocalciferol) 1.25 MG (50000 UNIT) Caps capsule Commonly known as: DRISDOL Take 1 capsule (50,000 Units total) by mouth every 7 (seven) days. What changed: additional instructions         Follow-up Information     Tiajuana Amass, MD. Go in 1 week(s).   Specialty: Cardiology Contact information: [address] [place] [address] [phone]                 Allergies  Allergen Reactions   Carvedilol Itching, Other (See Comments) and Swelling    Pain, itching and swelling beneath scrotum to anus.   Metoprolol Other (See Comments)    Cant remember  Cant remember  Cant remember  Cant remember   Toprol Xl [Metoprolol Tartrate] Other  (See Comments)    Cant remember   Buprenorphine Itching and Rash   Buprenorphine Hcl Itching and Rash   Morphine Itching and Other (See Comments)   Morphine And Related Itching, Rash and Other (See Comments)   Triamcinolone Rash   Triamcinolone Acetonide Rash     The results of significant diagnostics from this hospitalization (including imaging, microbiology, ancillary and laboratory) are listed below for reference.   Consultations:   Procedures/Studies: US Abdomen Limited RUQ (LIVER/GB)  Result Date: 06/28/2022 CLINICAL DATA:  Elevated LFTs, history of cholecystectomy EXAM: ULTRASOUND ABDOMEN LIMITED RIGHT UPPER QUADRANT COMPARISON:  Abdominal ultrasound April 12, 2022 FINDINGS: Gallbladder: The gallbladder is surgically absent. No sonographic Murphy's sign is noted by the sonographer. Common bile duct: Diameter: 5 mm Liver: There is a 2.1 x 1.4 x 2.1 cm echogenic mass in the right hepatic lobe. The hepatic parenchymal echogenicity is within normal limits. Portal vein is patent on color Doppler imaging with normal direction of blood flow towards the liver. Other: None. IMPRESSION: 1. No etiology for elevated LFTs is identified. 2. Incidentally visualized 2.1 cm echogenic mass in the right hepatic lobe, which is incompletely characterized. Recommend further assessment with abdominal MRI in the non-emergent setting. Electronically Signed   By: Jacob Moores M.D.   On: 06/28/2022 17:31   DG Chest 2 View  Result Date: 06/28/2022 CLINICAL DATA:  Shortness of breath, fatigue. EXAM: CHEST - 2 VIEW COMPARISON:  Chest x-ray dated 05/15/2022 FINDINGS: Stable cardiomegaly. Persistent opacity at the LEFT lung base. RIGHT lung is clear. Pneumothorax is seen. No acute-appearing osseous abnormality. IMPRESSION: 1. Persistent opacity at the LEFT lung base, corresponding to the previously documented LEFT breast mass and adjacent pleural effusion. 2. Stable cardiomegaly. Electronically Signed   By: Bary Richard M.D.   On: 06/28/2022 14:40   MR Brain W Wo Contrast  Result Date: 06/17/2022 CLINICAL DATA:  Staging for metastatic melanoma. EXAM: MRI HEAD WITHOUT AND WITH CONTRAST TECHNIQUE: Multiplanar, multiecho pulse sequences of the brain and surrounding structures were obtained without and with intravenous contrast. CONTRAST:  10mL GADAVIST GADOBUTROL 1 MMOL/ML IV SOLN COMPARISON:  None FINDINGS: Brain: No acute infarct, hemorrhage, or mass lesion is present. No significant white matter lesions are present. The ventricles are of normal size. No significant extraaxial fluid collection is present. The brainstem and cerebellum are within normal limits. The internal auditory canals are within normal limits. Postcontrast images demonstrate no pathologic enhancement. Vascular: Flow is present in the major intracranial arteries. Skull and upper cervical spine: The craniocervical junction is normal. Upper cervical spine is within normal limits. Marrow signal is unremarkable. Sinuses/Orbits: Mild mucosal thickening is present in the inferior left maxillary sinus. The paranasal sinuses and mastoid air cells are otherwise clear. Bilateral lens replacements are noted. Globes and orbits are otherwise unremarkable. IMPRESSION: Negative MRI of the brain. No evidence for metastatic disease to the brain. Electronically Signed   By: Marin Roberts M.D.   On: 06/17/2022 17:39      Labs: BNP (last 3 results) Recent Labs    04/11/22 2033 06/28/22 1350  BNP 1,319.6* 1,068.8*   Basic Metabolic Panel: Recent Labs  Lab 06/28/22 1349 06/29/22 0317 06/30/22 0515  NA 130* 132* 135  K 4.8 4.6 4.6  CL 98 100 100  CO2 GLUCOSE 385* 226* 133*  BUN 70* 77* 80*  CREATININE 1.69* 1.92* 2.02*  CALCIUM 8.2* 7.9* 8.0*  MG  --  2.2 2.1   Liver Function Tests: Recent Labs  Lab 06/28/22 1350  AST 44*  ALT 62*  ALKPHOS 274*  BILITOT 2.2*  PROT 6.8  ALBUMIN 2.5*   No results for input(s): "LIPASE",  "AMYLASE" in the last 168 hours. No results for input(s): "AMMONIA" in the last 168 hours. CBC: Recent Labs  Lab 06/28/22 1349 06/29/22 0317 06/30/22 0515  WBC 7.3 5.8 6.7  HGB 8.9* 7.4* 7.6*  HCT 28.1* 23.4* 23.8*  MCV 94.0 93.2 92.6  PLT 207 180 198   Cardiac Enzymes: No results for input(s): "CKTOTAL", "CKMB", "CKMBINDEX", "TROPONINI" in the last 168 hours. BNP: Invalid input(s): "POCBNP" CBG: Recent Labs  Lab 06/29/22 1206 06/29/22 1655 06/29/22 2134 06/30/22 0803 06/30/22 1241  GLUCAP 296* 219* 123* 139* 183*   D-Dimer No results for input(s): "DDIMER" in the last 72 hours. Hgb A1c No results for input(s): "HGBA1C" in the last 72 hours. Lipid Profile No results for input(s): "CHOL", "HDL", "LDLCALC", "TRIG", "CHOLHDL", "LDLDIRECT" in the last 72 hours. Thyroid function studies No results for input(s): "TSH", "T4TOTAL", "T3FREE", "THYROIDAB" in the last 72 hours.  Invalid input(s): "FREET3" Anemia work up Recent Labs    06/28/22 1533  FOLATE 12.8   Urinalysis    Component Value Date/Time   COLORURINE Yellow 04/06/2011 1850   COLORURINE YELLOW 08/25/2006 1122   APPEARANCEUR Hazy 04/06/2011 1850   LABSPEC 1.008 04/06/2011 1850   PHURINE 5.0 04/06/2011 1850   PHURINE 5.0 08/25/2006 1122   GLUCOSEU 50 mg/dL 40/98/1191 4782   HGBUR 1+ 04/06/2011 1850   HGBUR NEGATIVE 08/25/2006 1122   BILIRUBINUR Negative 04/06/2011 1850   KETONESUR Negative 04/06/2011 1850   KETONESUR NEGATIVE 08/25/2006 1122   PROTEINUR 30 mg/dL 95/62/1308 6578   PROTEINUR NEGATIVE 08/25/2006 1122   UROBILINOGEN 0.2 08/25/2006 1122   NITRITE Negative 04/06/2011 1850   NITRITE NEGATIVE 08/25/2006 1122   LEUKOCYTESUR Trace 04/06/2011 1850   Sepsis Labs Recent Labs  Lab 06/28/22 1349 06/29/22 0317 06/30/22 0515  WBC 7.3 5.8 6.7   Microbiology Recent Results (from the past 240 hour(s))  SARS Coronavirus 2 by RT PCR (hospital order, performed in [facility] hospital lab)  *  cepheid single result test* Anterior Nasal Swab     Status: None   Collection Time: 06/28/22  3:33 PM   Specimen: Anterior Nasal Swab  Result Value Ref Range Status   SARS Coronavirus 2 by RT PCR NEGATIVE NEGATIVE Final    Comment: (NOTE) SARS-CoV-2 target nucleic acids are NOT DETECTED.  The SARS-CoV-2 RNA is generally detectable in upper and lower respiratory specimens during the acute phase of infection. The lowest concentration of SARS-CoV-2 viral copies this assay can detect is 250 copies / mL. A negative result does not preclude SARS-CoV-2 infection and should not be used as the sole basis for treatment or other patient management decisions.  A negative result may occur with improper specimen collection / handling, submission of specimen other than nasopharyngeal swab, presence of viral mutation(s) within the areas targeted by this assay, and inadequate number of viral copies (<250 copies / mL). A negative result must be combined with clinical observations, patient history, and epidemiological information.  Fact Sheet for Patients:   RoadLapTop.co.za  Fact Sheet for Healthcare Providers: http://kim-miller.com/  This test is not yet approved or  cleared by the [place] FDA and has been authorized for detection and/or diagnosis of SARS-CoV-2 by FDA under an Emergency Use Authorization (EUA).  This EUA will remain in effect (meaning this test can be used) for the duration of the COVID-19 declaration under Section 564(b)(1) of the Act, 21 U.S.C. section 360bbb-3(b)(1), unless the authorization is terminated or revoked sooner.  Performed at [facility], [address] [place], [place] [address]   Culture, blood (Routine X 2) w Reflex to ID Panel     Status: None (Preliminary result)   Collection Time: 06/28/22  5:52 PM   Specimen: BLOOD  Result Value Ref Range Status   Specimen Description BLOOD RIGHT ANTECUBITAL   Final   Special Requests   Final    BOTTLES DRAWN AEROBIC AND ANAEROBIC Blood Culture adequate volume   Culture   Final    NO GROWTH 2 DAYS Performed at [facility], [address] [place], [place] [address]    Report Status PENDING  Incomplete  Culture, blood (Routine X 2) w Reflex to ID Panel     Status: None (Preliminary result)   Collection Time: 06/28/22  5:52 PM   Specimen: BLOOD  Result Value Ref Range Status   Specimen Description BLOOD LEFT ANTECUBITAL  Final   Special Requests   Final    BOTTLES DRAWN AEROBIC AND ANAEROBIC Blood Culture adequate volume   Culture   Final    NO GROWTH 2 DAYS Performed at [facility], [address], [place], [place] [address]    Report Status PENDING  Incomplete     Total time spend on discharging this patient, including the last patient exam, discussing the hospital stay, instructions for ongoing care as it relates to all pertinent caregivers, as well as preparing the medical discharge records, prescriptions, and/or referrals as applicable, is 40 minutes.    Darlin Priestly, MD  [facility] 06/30/2022, 12:43 PM

## 2022-06-30 NOTE — Discharge Instructions (Signed)

## 2022-06-30 NOTE — Progress Notes (Signed)
Mobility Specialist - Progress Note  Pre-mobility: SpO2 94% During mobility: SpO2 85%-91% Post-mobility: SPO2 91%   06/30/22 1126  Mobility  Activity Ambulated with assistance in room  Level of Assistance Standby assist, set-up cues, supervision of patient - no hands on  Assistive Device Front wheel walker  Distance Ambulated (ft) 20 ft  Activity Response Tolerated well  $Mobility charge 1 Mobility   Pt sitting in the recliner upon entry, utilizing RA. Pt agreeable to amb in the room this date. Pt STS to RW and amb SBA-Supervision. Pt amb 10 ft in the room before desat to 85%, denied any S/S. Pt stopped for a standing rest break to perform 1 minute of pursed lip breathing, 02 rose to 91%. Pt returned to the recliner, left seated with alarm set and needs within reach. Family member present at bedside.   Zetta Bills Mobility Specialist 06/30/22 11:35 AM

## 2022-06-30 NOTE — TOC Transition Note (Signed)
Transition of Care [facility]) - CM/SW Discharge Note   Patient Details  Name: [name] MRN: [id number] Date of Birth: [birth year]  [facility] CM/SW Contact:  Allena Katz, LCSW Phone Number: 06/30/2022, 1:02 PM   Clinical Narrative:   Pt agreeable to [facility] services through [facility] for PT/RN. Denyse Amass with [facility] notified.          Patient Goals and CMS Choice      Discharge Placement                         Discharge Plan and Services Additional resources added to the After Visit Summary for                                       Social Determinants of Health (SDOH) Interventions SDOH Screenings   Food Insecurity: No Food Insecurity (06/28/2022)  Housing: Low Risk  (06/28/2022)  Transportation Needs: No Transportation Needs (06/28/2022)  Utilities: Not At Risk (06/28/2022)  Depression (PHQ2-9): Low Risk  (02/27/2022)  Recent Concern: Depression (PHQ2-9) - Medium Risk (02/02/2022)  Tobacco Use: Low Risk  (06/28/2022)     Readmission Risk Interventions     No data to display

## 2022-07-01 LAB — CULTURE, BLOOD (ROUTINE X 2)
Culture: NO GROWTH
Special Requests: ADEQUATE

## 2022-07-02 LAB — CULTURE, BLOOD (ROUTINE X 2)

## 2022-07-03 LAB — CULTURE, BLOOD (ROUTINE X 2): Culture: NO GROWTH

## 2022-07-06 ENCOUNTER — Inpatient Hospital Stay: Payer: Medicare HMO

## 2022-07-06 ENCOUNTER — Telehealth: Payer: Self-pay

## 2022-07-06 ENCOUNTER — Encounter: Payer: Self-pay | Admitting: Oncology

## 2022-07-06 ENCOUNTER — Other Ambulatory Visit: Payer: Self-pay

## 2022-07-06 ENCOUNTER — Inpatient Hospital Stay (HOSPITAL_BASED_OUTPATIENT_CLINIC_OR_DEPARTMENT_OTHER): Payer: Medicare HMO | Admitting: Oncology

## 2022-07-06 DIAGNOSIS — C7800 Secondary malignant neoplasm of unspecified lung: Secondary | ICD-10-CM | POA: Diagnosis not present

## 2022-07-06 DIAGNOSIS — Z79899 Other long term (current) drug therapy: Secondary | ICD-10-CM | POA: Diagnosis not present

## 2022-07-06 DIAGNOSIS — R808 Other proteinuria: Secondary | ICD-10-CM | POA: Diagnosis not present

## 2022-07-06 DIAGNOSIS — C434 Malignant melanoma of scalp and neck: Secondary | ICD-10-CM | POA: Diagnosis not present

## 2022-07-06 DIAGNOSIS — Z5112 Encounter for antineoplastic immunotherapy: Secondary | ICD-10-CM | POA: Diagnosis not present

## 2022-07-06 DIAGNOSIS — D509 Iron deficiency anemia, unspecified: Secondary | ICD-10-CM

## 2022-07-06 DIAGNOSIS — R197 Diarrhea, unspecified: Secondary | ICD-10-CM | POA: Diagnosis not present

## 2022-07-06 DIAGNOSIS — D472 Monoclonal gammopathy: Secondary | ICD-10-CM

## 2022-07-06 LAB — CBC WITH DIFFERENTIAL (CANCER CENTER ONLY)
Abs Immature Granulocytes: 0.09 10*3/uL — ABNORMAL HIGH (ref 0.00–0.07)
Basophils Absolute: 0.1 10*3/uL (ref 0.0–0.1)
Basophils Relative: 1 %
Eosinophils Absolute: 0.4 10*3/uL (ref 0.0–0.5)
Eosinophils Relative: 4 %
HCT: 28.5 % — ABNORMAL LOW (ref 39.0–52.0)
Hemoglobin: 8.8 g/dL — ABNORMAL LOW (ref 13.0–17.0)
Immature Granulocytes: 1 %
Lymphocytes Relative: 9 %
Lymphs Abs: 0.8 10*3/uL (ref 0.7–4.0)
MCH: 29.1 pg (ref 26.0–34.0)
MCHC: 30.9 g/dL (ref 30.0–36.0)
MCV: 94.4 fL (ref 80.0–100.0)
Monocytes Absolute: 0.5 10*3/uL (ref 0.1–1.0)
Monocytes Relative: 6 %
Neutro Abs: 6.8 10*3/uL (ref 1.7–7.7)
Neutrophils Relative %: 79 %
Platelet Count: 284 10*3/uL (ref 150–400)
RBC: 3.02 MIL/uL — ABNORMAL LOW (ref 4.22–5.81)
RDW: 17.5 % — ABNORMAL HIGH (ref 11.5–15.5)
WBC Count: 8.6 10*3/uL (ref 4.0–10.5)
nRBC: 0 % (ref 0.0–0.2)

## 2022-07-06 LAB — IRON AND TIBC
Iron: 59 ug/dL (ref 45–182)
Saturation Ratios: 25 % (ref 17.9–39.5)
TIBC: 235 ug/dL — ABNORMAL LOW (ref 250–450)
UIBC: 176 ug/dL

## 2022-07-06 LAB — CMP (CANCER CENTER ONLY)
ALT: 24 U/L (ref 0–44)
AST: 16 U/L (ref 15–41)
Albumin: 2.4 g/dL — ABNORMAL LOW (ref 3.5–5.0)
Alkaline Phosphatase: 236 U/L — ABNORMAL HIGH (ref 38–126)
Anion gap: 9 (ref 5–15)
BUN: 98 mg/dL — ABNORMAL HIGH (ref 8–23)
CO2: 23 mmol/L (ref 22–32)
Calcium: 8.2 mg/dL — ABNORMAL LOW (ref 8.9–10.3)
Chloride: 97 mmol/L — ABNORMAL LOW (ref 98–111)
Creatinine: 2.08 mg/dL — ABNORMAL HIGH (ref 0.61–1.24)
GFR, Estimated: 33 mL/min — ABNORMAL LOW (ref >60)
Glucose, Bld: 184 mg/dL — ABNORMAL HIGH (ref 70–99)
Potassium: 4.8 mmol/L (ref 3.5–5.1)
Sodium: 129 mmol/L — ABNORMAL LOW (ref 135–145)
Total Bilirubin: 1.4 mg/dL — ABNORMAL HIGH (ref 0.3–1.2)
Total Protein: 6.8 g/dL (ref 6.5–8.1)

## 2022-07-06 LAB — FERRITIN: Ferritin: 263 ng/mL (ref 24–336)

## 2022-07-06 MED ORDER — SODIUM CHLORIDE 0.9 % IV SOLN
3.0000 mg/kg | Freq: Once | INTRAVENOUS | Status: AC
  Administered 2022-07-06: 350 mg via INTRAVENOUS
  Filled 2022-07-06: qty 30

## 2022-07-06 MED ORDER — SODIUM CHLORIDE 0.9 % IV SOLN
0.9700 mg/kg | Freq: Once | INTRAVENOUS | Status: AC
  Administered 2022-07-06: 120 mg via INTRAVENOUS
  Filled 2022-07-06: qty 12

## 2022-07-06 MED ORDER — DIPHENHYDRAMINE HCL 50 MG/ML IJ SOLN
25.0000 mg | Freq: Once | INTRAMUSCULAR | Status: AC
  Administered 2022-07-06: 25 mg via INTRAVENOUS
  Filled 2022-07-06: qty 1

## 2022-07-06 MED ORDER — FERROUS SULFATE 325 (65 FE) MG PO TABS: 325.0000 mg | ORAL_TABLET | Freq: Two times a day (BID) | ORAL | 1 refills | Status: DC

## 2022-07-06 MED ORDER — SODIUM CHLORIDE 0.9 % IV SOLN
Freq: Once | INTRAVENOUS | Status: AC
  Filled 2022-07-06: qty 250

## 2022-07-06 MED ORDER — FAMOTIDINE IN NACL 20-0.9 MG/50ML-% IV SOLN
20.0000 mg | Freq: Once | INTRAVENOUS | Status: AC
  Administered 2022-07-06: 20 mg via INTRAVENOUS
  Filled 2022-07-06: qty 50

## 2022-07-06 NOTE — Telephone Encounter (Signed)
Spoke to son Feliz Beam informed of MRI requested by Dr. Orlie Dakin. Discussed time, location and NPO status. All questions addressed.

## 2022-07-06 NOTE — Research (Signed)
S2013 - Immune Checkpoint Inhibitor Toxicity (I-CHECKIT): A Prospective Observational Study : Week 4 Visit  Patient in to the cancer center this morning for his re-scheduled week 4 visit for the S2013 protocol and to receive his next immunotherapy infusion. He is accompanied by his son Feliz Beam today. Patient is in a wheelchair today due to his fatigue and shortness of breath. Patient states he was hospitalized last week for A. Fib, which occurred all of a sudden. States since he came home from the hospital he has had severe fatigue, shortness of breath,lower extremity edema, decreased appetite, taste changes, itchy skin-rating severe without rash, and bone pain from his hips down. He is alternating Tylenol and Ibuprofen for his bone pain per Dr. Orlie Dakin instructions. He and his son both agree there has been a drastic change in his condition and it affects his ADL's significantly since his hospitalization. Patient completed his required protocol questionnaires with help from the research nurse to ask and write down his answers. Concomitant medications were reviewed with the patient today. Dr. Orlie Dakin examined patient today, states that the swelling, shortness of breath and fatigue are attributed to the A. Fib he is having. He also assured the patient that A. Fib was not caused by the immunotherapy he is receiving, and the fatigue, shortness of breath, and swelling are related to the A. Fib. Dr. Orlie Dakin did listen to the patients heart and heard irregularities, encouraged the patient to see his cardiologist this week if possible. He has an appointment coming up, but Dr. Orlie Dakin wants it to be as early as he can get. Feliz Beam is planning to go over to [facility] during patients treatment today to speak with them concerning patient complaints and change his appointment hopefully. Patient was agreeable to continuing his immunotherapy today. Patient encouraged to call research nurse for any issues or concerns he  may have in the future. Research nurse will see patient again for his next treatment unless he needs to come in earlier than that. Patient assisted to the waiting area for his next treatment.    [name] [id number]  07/06/2022  Adverse Event Log  Study/Protocol: J1914 Cycle: Week 4 Treatment  Event Grade Onset Date Resolved Date Drug Name Attribution Treatment Comments  Atrial Fibrillation Grade 3 06/28/2022 06/30/2022 Nivolumab / Ipilimumab Unrelated Hospitalization- Cardizem    Fatigue Grade 3 06/28/2022  Nivolumab / Ipilimumab Unrelated None Related to A. Fib  Dyspnea Grade 1 06/28/2022  Nivolumab / Ipilimumab Unrelated None Related to A. Fib  Localized Edema Grade 2 06/28/2022  Nivolumab / Ipilimumab Unrelated None   Bone Pain Grade 2 06/10/2022  Nivolumab / Ipilimumab Probably Tylenol / Ibuprofen   Pruritus Grade 1 06/10/2022  Nivolumab / Ipilimumab Probably None   Anorexia Grade 1 06/10/2022  Nivolumab / Ipilimumab Possibly None   Dysgeusia Grade 2  06/10/2022  Nivolumab / Ipilimumab Possibly None    Chriss Driver, RN 07/06/22 10:40 AM

## 2022-07-06 NOTE — Progress Notes (Signed)
Complains of fatigue and no energy.

## 2022-07-06 NOTE — Progress Notes (Signed)
[facility]  Telephone:([phone]) [phone] Fax:(336) [phone]  ID: [name] OB: 1950/05/04  MR#: [id number]  CSN#:[id number]  Patient Care Team: Dorothey Baseman, MD as PCP - General (Family Medicine) Stanton Kidney, MD as Consulting Physician (Gastroenterology) Jeralyn Ruths, MD as Consulting Physician (Oncology) Kemper Durie, RN as [facility] Care Management  CHIEF COMPLAINT: Stage IV melanoma with lung metastasis.  INTERVAL HISTORY: Patient returns to clinic today for further evaluation, hospital follow-up, and consideration of cycle 2 of ipilimumab and nivolumab.  He was admitted for increasing weakness and fatigue and found to have new onset A-fib.  Patient's weakness and fatigue persist, but he otherwise feels well.  He has no neurologic complaints.  He denies any fevers.  He has a poor appetite.  He has no chest pain, cough, or hemoptysis.  He denies any nausea, vomiting, constipation, or diarrhea.  He has no melena or hematochezia.  He has no urinary complaints.  Patient offers no further specific complaints today.  REVIEW OF SYSTEMS:   Review of Systems  Constitutional:  Positive for malaise/fatigue. Negative for fever and weight loss.  Respiratory:  Positive for shortness of breath. Negative for cough and hemoptysis.   Cardiovascular: Negative.  Negative for chest pain and leg swelling.  Gastrointestinal: Negative.  Negative for abdominal pain, blood in stool, diarrhea and melena.  Genitourinary: Negative.  Negative for hematuria.  Musculoskeletal: Negative.  Negative for back pain.  Skin: Negative.  Negative for rash.  Neurological:  Positive for weakness. Negative for dizziness, focal weakness and headaches.  Psychiatric/Behavioral: Negative.  The patient is not nervous/anxious.     As per HPI. Otherwise, a complete review of systems is negative.  PAST MEDICAL HISTORY: Past Medical History:  Diagnosis Date   Aortic  atherosclerosis    Arthritis    Bilateral carotid artery stenosis    a.) carotid doppler 05/07/2022: 1-39% BICA   CHF (congestive heart failure), NYHA class II, acute on chronic, systolic    a.) TTE 01/09/2015: EF 35%, LVH, inf HK, LAE, triv TR/PR, mild MR, G1DD; b.) TTE 04/09/2017: EF 45%, mod LVH, post HK, triv MR/TR; c.) TTE 07/26/2018: EF 40%, LVH, inf/post HK, LAE, RVE, triv-mild panval regurg, G1DD; d.) TTE 06/01/2019: EF 40%, LVH, api/inf HK, BAE, triv MR/TR/PR, G1DD; e.) TTE 05/20/2021: EF 45%, LVH, LAE, triv MR/TR/PR; f.) TTE 04/12/2022: EF 45-50%, glob HK, BAE, triv MR, G2DD   Chronic kidney disease (CKD), stage II (mild)    Chronic membranous glomerulonephritis    Coronary artery disease    a.) LHC 05/02/1999: 25/25/75% mLAD --> 2.5 x 18 mm Guidant/ACE OTW Tetra   DDD (degenerative disc disease), lumbar    Diabetic neuropathy    Dysplastic nevus 08/26/2020   L flank, moderate atypia   Dyspnea on exertion    Elevated troponin    History of bilateral cataract extraction    Hyperlipidemia    Hypertension    Iron deficiency anemia    Ischemic cardiomyopathy    Lymphedema    Melanoma 08/20/2015   Right neck. Superficial spreading, arising in nevus. Tumor thickness 1.59mm, Anatomic level IV   Melanoma in situ 05/22/2020   R lateral neck ant to scar, exc 05/22/20   Osteomyelitis of great toe of left foot    Pulmonary mass 04/23/2022   a.)  CT chest 04/23/2022: LLL mass suspicious for bronchogenic neoplasm with associated peripheral changes; b.)  PET CT 05/12/2022: Hypermetabolic LEFT lower lobe mass measuring 8.5 x 4.6 cm (SUV  max 23) disease; second foci of metabolic activity slightly more inferior with similar SUV intensity.   RBBB (right bundle branch block)    Recurrent left pleural effusion    Skin cancer    removed l arm   Sleep apnea    a.) s/p uvulectomy 1990   Spinal stenosis of lumbar region with radiculopathy    Swelling of both lower extremities    T2DM (type 2  diabetes mellitus)    a.) uses Dexcom G6 CGM   Venous ulcer of ankle     PAST SURGICAL HISTORY: Past Surgical History:  Procedure Laterality Date   CATARACT EXTRACTION W/PHACO Right 05/03/2019   Procedure: CATARACT EXTRACTION PHACO AND INTRAOCULAR LENS PLACEMENT (IOC) RIGHT DIABETIC 6.44 00:58.4 11.0%;  Surgeon: Lockie Mola, MD;  Location: [facility];  Service: Ophthalmology;  Laterality: Right;  DIABETIC   CATARACT EXTRACTION W/PHACO Left 06/28/2019   Procedure: CATARACT EXTRACTION PHACO AND INTRAOCULAR LENS PLACEMENT (IOC) LEFT DIABETIC 7.99  01:13.4  10.9% ;  Surgeon: Lockie Mola, MD;  Location: [facility];  Service: Ophthalmology;  Laterality: Left;  Diabetic - insulin and oral meds   CHOLECYSTECTOMY  1985   CORONARY ANGIOPLASTY WITH STENT PLACEMENT Left 05/02/1999   LUMBAR LAMINECTOMY/DECOMPRESSION MICRODISCECTOMY  04/13/2012   Procedure: LUMBAR LAMINECTOMY/DECOMPRESSION MICRODISCECTOMY 1 LEVEL;  Surgeon: Javier Docker, MD;  Location: [facility];  Service: Orthopedics;  Laterality: Bilateral;  L4-L5   TOE AMPUTATION Left    big toe   TOTAL KNEE ARTHROPLASTY Right 2007   UVULECTOMY  11/14/1988   VIDEO BRONCHOSCOPY WITH ENDOBRONCHIAL ULTRASOUND N/A 05/15/2022   Procedure: VIDEO BRONCHOSCOPY WITH ENDOBRONCHIAL ULTRASOUND;  Surgeon: Vida Rigger, MD;  Location: [facility];  Service: Thoracic;  Laterality: N/A;    FAMILY HISTORY: Family History  Problem Relation Age of Onset   Cancer Mother     ADVANCED DIRECTIVES (Y/N):  N  HEALTH MAINTENANCE: Social History   Tobacco Use   Smoking status: Never   Smokeless tobacco: Never  Vaping Use   Vaping Use: Never used  Substance Use Topics   Alcohol use: Yes    Comment: rare   Drug use: No     Colonoscopy:  PAP:  Bone density:  Lipid panel:  Allergies  Allergen Reactions   Carvedilol Itching, Other (See Comments) and Swelling    Pain, itching and swelling beneath scrotum to anus.    Metoprolol Other (See Comments)    Cant remember  Cant remember  Cant remember  Cant remember   Toprol Xl [Metoprolol Tartrate] Other (See Comments)    Cant remember   Buprenorphine Itching and Rash   Buprenorphine Hcl Itching and Rash   Morphine Itching and Other (See Comments)   Morphine And Related Itching, Rash and Other (See Comments)   Triamcinolone Rash   Triamcinolone Acetonide Rash    Current Outpatient Medications  Medication Sig Dispense Refill   amiodarone (PACERONE) 200 MG tablet Take 1 tablet (200 mg total) by mouth 2 (two) times daily for 6 days. 12 tablet 0   amiodarone (PACERONE) 200 MG tablet Take 1 tablet (200 mg total) by mouth daily. 30 tablet 2   apixaban (ELIQUIS) 5 MG TABS tablet Take 1 tablet (5 mg total) by mouth 2 (two) times daily. 60 tablet 2   atorvastatin (LIPITOR) 80 MG tablet Take 80 mg by mouth at bedtime.     Continuous Blood Gluc Receiver (DEXCOM G6 RECEIVER) DEVI Use to monitor blood sugar. NDC [phone]     Continuous Blood  Gluc Sensor (DEXCOM G6 SENSOR) MISC Use to monitor blood sugar.  Replace every 10 days. NDC [phone].     Continuous Blood Gluc Transmit (DEXCOM G6 TRANSMITTER) MISC Use to monitor blood sugar.  Replace every 3 months. NDC [phone].     diltiazem (CARDIZEM CD) 180 MG 24 hr capsule Take 1 capsule (180 mg total) by mouth daily. 30 capsule 2   insulin aspart (NOVOLOG) 100 UNIT/ML injection Inject 30 Units into the skin 3 (three) times daily before meals. Reduced from 50 u. 10 mL 11   insulin glargine (LANTUS) 100 UNIT/ML injection Inject 0.15 mLs (15 Units total) into the skin at bedtime. Reduced from 24 u. 10 mL 11   Insulin Pen Needle (COMFORT EZ PEN NEEDLES) 32G X 8 MM MISC See admin instructions.     prochlorperazine (COMPAZINE) 10 MG tablet Take 1 tablet (10 mg total) by mouth every 6 (six) hours as needed for nausea or vomiting. 30 tablet 1   spironolactone (ALDACTONE) 50 MG tablet Hold until followup with  cardiology due to kidney function.     Vitamin D, Ergocalciferol, (DRISDOL) 1.25 MG (50000 UNIT) CAPS capsule Take 1 capsule (50,000 Units total) by mouth every 7 (seven) days. (Patient taking differently: Take 50,000 Units by mouth every 7 (seven) days. Fridays) 12 capsule 0   ferrous sulfate (FEROSUL) 325 (65 FE) MG tablet Take 1 tablet (325 mg total) by mouth 2 (two) times daily with a meal. 90 tablet 1   torsemide (DEMADEX) 100 MG tablet Hold until followup with cardiology due to kidney function. (Patient not taking: Reported on 07/06/2022)     No current facility-administered medications for this visit.    OBJECTIVE: Vitals:   07/06/22 0908  BP: (!) 111/51  Pulse: 73  Resp: 18  Temp: (!) 96.8 F (36 C)  SpO2: 99%     Body mass index is 38.75 kg/m.    ECOG FS:0 - Asymptomatic  General: Well-developed, well-nourished, no acute distress. Eyes: Pink conjunctiva, anicteric sclera. HEENT: Normocephalic, moist mucous membranes. Lungs: No audible wheezing or coughing. Heart: Regular rate and rhythm. Abdomen: Soft, nontender, no obvious distention. Musculoskeletal: No edema, cyanosis, or clubbing. Neuro: Alert, answering all questions appropriately. Cranial nerves grossly intact. Skin: No rashes or petechiae noted. Psych: Normal affect.  LAB RESULTS:  Lab Results  Component Value Date   NA 129 (L) 07/06/2022   K 4.8 07/06/2022   CL 97 (L) 07/06/2022   CO2 23 07/06/2022   GLUCOSE 184 (H) 07/06/2022   BUN 98 (H) 07/06/2022   CREATININE 2.08 (H) 07/06/2022   CALCIUM 8.2 (L) 07/06/2022   PROT 6.8 07/06/2022   ALBUMIN 2.4 (L) 07/06/2022   AST 16 07/06/2022   ALT 24 07/06/2022   ALKPHOS 236 (H) 07/06/2022   BILITOT 1.4 (H) 07/06/2022   GFRNONAA 33 (L) 07/06/2022   GFRAA >60 10/15/2019    Lab Results  Component Value Date   WBC 8.6 07/06/2022   NEUTROABS 6.8 07/06/2022   HGB 8.8 (L) 07/06/2022   HCT 28.5 (L) 07/06/2022   MCV 94.4 07/06/2022   PLT 284 07/06/2022   Lab  Results  Component Value Date   IRON 59 06/22/2022   TIBC 223 (L) 06/22/2022   IRONPCTSAT 27 06/22/2022   Lab Results  Component Value Date   FERRITIN 210 06/22/2022     STUDIES: US Abdomen Limited RUQ (LIVER/GB)  Result Date: 06/28/2022 CLINICAL DATA:  Elevated LFTs, history of cholecystectomy EXAM: ULTRASOUND ABDOMEN LIMITED RIGHT UPPER QUADRANT COMPARISON:  Abdominal ultrasound April 12, 2022 FINDINGS: Gallbladder: The gallbladder is surgically absent. No sonographic Murphy's sign is noted by the sonographer. Common bile duct: Diameter: 5 mm Liver: There is a 2.1 x 1.4 x 2.1 cm echogenic mass in the right hepatic lobe. The hepatic parenchymal echogenicity is within normal limits. Portal vein is patent on color Doppler imaging with normal direction of blood flow towards the liver. Other: None. IMPRESSION: 1. No etiology for elevated LFTs is identified. 2. Incidentally visualized 2.1 cm echogenic mass in the right hepatic lobe, which is incompletely characterized. Recommend further assessment with abdominal MRI in the non-emergent setting. Electronically Signed   By: Jacob Moores M.D.   On: 06/28/2022 17:31   DG Chest 2 View  Result Date: 06/28/2022 CLINICAL DATA:  Shortness of breath, fatigue. EXAM: CHEST - 2 VIEW COMPARISON:  Chest x-ray dated 05/15/2022 FINDINGS: Stable cardiomegaly. Persistent opacity at the LEFT lung base. RIGHT lung is clear. Pneumothorax is seen. No acute-appearing osseous abnormality. IMPRESSION: 1. Persistent opacity at the LEFT lung base, corresponding to the previously documented LEFT breast mass and adjacent pleural effusion. 2. Stable cardiomegaly. Electronically Signed   By: Bary Richard M.D.   On: 06/28/2022 14:40   MR Brain W Wo Contrast  Result Date: 06/17/2022 CLINICAL DATA:  Staging for metastatic melanoma. EXAM: MRI HEAD WITHOUT AND WITH CONTRAST TECHNIQUE: Multiplanar, multiecho pulse sequences of the brain and surrounding structures were obtained  without and with intravenous contrast. CONTRAST:  10mL GADAVIST GADOBUTROL 1 MMOL/ML IV SOLN COMPARISON:  None FINDINGS: Brain: No acute infarct, hemorrhage, or mass lesion is present. No significant white matter lesions are present. The ventricles are of normal size. No significant extraaxial fluid collection is present. The brainstem and cerebellum are within normal limits. The internal auditory canals are within normal limits. Postcontrast images demonstrate no pathologic enhancement. Vascular: Flow is present in the major intracranial arteries. Skull and upper cervical spine: The craniocervical junction is normal. Upper cervical spine is within normal limits. Marrow signal is unremarkable. Sinuses/Orbits: Mild mucosal thickening is present in the inferior left maxillary sinus. The paranasal sinuses and mastoid air cells are otherwise clear. Bilateral lens replacements are noted. Globes and orbits are otherwise unremarkable. IMPRESSION: Negative MRI of the brain. No evidence for metastatic disease to the brain. Electronically Signed   By: Marin Roberts M.D.   On: 06/17/2022 17:39    ASSESSMENT: IStage IV melanoma with lung metastasis.  PLAN:   Melanoma: Patient had Mohs procedure on his right neck to remove a malignant melanoma in approximately 2017 by report was T2a, N0, M0 lesion or stage Ib.  PET scan results from May 12, 2022 reviewed independently and reported as above with hypermetabolic left lower lobe lung mass.  Biopsy consistent with malignant melanoma.  Given the likely malignant pleural effusion, patient is not a surgical candidate therefore we will proceed with combination immunotherapy using ipilimumab and nivolumab every 3 weeks x 4 followed by high-dose nivolumab every 4 weeks.  Patient has declined port placement.  Patient tolerated cycle 1 of treatment.  Cycle 2 was delayed 1 week secondary to hospital admission for A-fib.  Proceed with cycle 2 today.  Return to clinic in 3  weeks for further evaluation and consideration of cycle 3. Iron deficiency anemia: Chronic and unchanged.  Patient's hemoglobin is 8.8 today.  Previously, all of his other laboratory work other is either negative or within normal limits.  He last received IV Venofer on April 13, 2022.  Monitor closely  throughout treatment.   MGUS: Resolved.  Most recent M spike was not observed.   Nephrotic range proteinuria: Case discussed with nephrology.  Patient never received 1000 mg of Rituxan.  Continue to hold treatment at this time.   Renal insufficiency: Creatinine is increased to 2.08.  Monitor.  Treatment does not need to be dose reduced in the setting of renal insufficiency.  Follow-up with nephrology as indicated. Blood glucose: Patient currently has poor blood glucose control, monitor.  Follow-up with primary care as needed. Diarrhea: Patient does not complain of this today.  OTC Imodium as needed. Atrial fibrillation: Continue current medications as prescribed.  Follow-up with cardiology as scheduled. Hyponatremia: Patient's sodium level has declined to 129, monitor. Liver lesion: Patient noted to have a 2.1 cm lesion in right hepatic lobe on ultrasound from June 28, 2022 during his hospital admission.  PET scan results as above with no obvious liver disease.  Will get an MRI of his liver to further evaluate.  Patient expressed understanding and was in agreement with this plan. He also understands that He can call clinic at any time with any questions, concerns, or complaints.    Jeralyn Ruths, MD   07/06/2022 9:38 AM

## 2022-07-06 NOTE — Progress Notes (Signed)
Ipilimumab (YERVOY) Patient Monitoring Assessment   Is the patient experiencing any of the following general symptoms?:  Patient is not experiencing any of the general symptoms listed in this section.  Difficulty performing normal activities Feeling sluggish or cold all the time Unusual weight gain Constant or unusual headaches Feeling dizzy or faint Changes in eyesight (blurry vision, double vision, or other vision problems) Changes in mood or behavior (ex: decreased sex drive, irritability, or forgetfulness) Starting new medications (ex: steroids, other medications that lower immune response)   Gastrointestinal  Patient is having 1 bowel movements each day.  Is this different from baseline? Yes No Are your stools watery or do they have a foul smell? Yes No Have you seen blood in your stools? Yes No Are your stools dark, tarry, or sticky? Yes No Are you having pain or tenderness in your belly? Yes No  Skin Does your skin itch? Yes No Do you have a rash? Yes No Has your skin blistered and/or peeled? Yes No Do you have sores in your mouth? Yes No  Hepatic Has your urine been dark or tea colored? Yes No Have you noticed your skin or the whites of your eyes are turning yellow? Yes No Are you bleeding or bruising more easily than normal? Yes No Are you nauseous and/or vomiting? Yes No Do you have pain on the right side of your stomach? Yes No  Neurologic  Are you having unusual weakness of legs, arms, or face? Yes No legs only  Are you having numbness or tingling in your hands or feet? Yes No  Pt has stated he has reviewed the answers to these questions with Dr Orlie Dakin.  Okay to proceed with treatment.   Valentina Alcoser O Ondra Deboard

## 2022-07-07 ENCOUNTER — Ambulatory Visit: Payer: Self-pay | Admitting: *Deleted

## 2022-07-07 LAB — KAPPA/LAMBDA LIGHT CHAINS
Kappa free light chain: 122.7 mg/L — ABNORMAL HIGH (ref 3.3–19.4)
Kappa, lambda light chain ratio: 1.65 (ref 0.26–1.65)
Lambda free light chains: 74.3 mg/L — ABNORMAL HIGH (ref 5.7–26.3)

## 2022-07-07 LAB — IGG, IGA, IGM
IgA: 260 mg/dL (ref 61–437)
IgG (Immunoglobin G), Serum: 1815 mg/dL — ABNORMAL HIGH (ref 603–1613)
IgM (Immunoglobulin M), Srm: 119 mg/dL (ref 15–143)

## 2022-07-07 NOTE — Patient Outreach (Signed)
  Care Coordination   07/07/2022 Name: [name] MRN: [id number] DOB: 28-Mar-1950   Care Coordination Outreach Attempts:  An unsuccessful telephone outreach was attempted for a scheduled appointment today.  Follow Up Plan:  Additional outreach attempts will be made to offer the patient care coordination information and services.   Encounter Outcome:  No Answer   Care Coordination Interventions:  No, not indicated    Kemper Durie, RN, MSN, [facility] Care Management Care Management Coordinator [phone]

## 2022-07-09 ENCOUNTER — Other Ambulatory Visit
Admission: RE | Admit: 2022-07-09 | Discharge: 2022-07-09 | Disposition: A | Discharge status: Home / Self Care | Payer: Medicare HMO | Source: Ambulatory Visit | Attending: Nurse Practitioner | Admitting: Nurse Practitioner

## 2022-07-09 DIAGNOSIS — I2510 Atherosclerotic heart disease of native coronary artery without angina pectoris: Secondary | ICD-10-CM | POA: Diagnosis not present

## 2022-07-09 DIAGNOSIS — I700 Atherosclerosis of aorta: Secondary | ICD-10-CM | POA: Diagnosis not present

## 2022-07-09 DIAGNOSIS — I4891 Unspecified atrial fibrillation: Secondary | ICD-10-CM | POA: Diagnosis not present

## 2022-07-09 DIAGNOSIS — I10 Essential (primary) hypertension: Secondary | ICD-10-CM | POA: Diagnosis not present

## 2022-07-09 DIAGNOSIS — E782 Mixed hyperlipidemia: Secondary | ICD-10-CM | POA: Diagnosis not present

## 2022-07-09 DIAGNOSIS — I5022 Chronic systolic (congestive) heart failure: Secondary | ICD-10-CM | POA: Insufficient documentation

## 2022-07-09 DIAGNOSIS — E1159 Type 2 diabetes mellitus with other circulatory complications: Secondary | ICD-10-CM | POA: Diagnosis not present

## 2022-07-09 LAB — PROTEIN ELECTROPHORESIS, SERUM
A/G Ratio: 0.6 — ABNORMAL LOW (ref 0.7–1.7)
Albumin ELP: 2.3 g/dL — ABNORMAL LOW (ref 2.9–4.4)
Alpha-1-Globulin: 0.3 g/dL (ref 0.0–0.4)
Alpha-2-Globulin: 0.9 g/dL (ref 0.4–1.0)
Beta Globulin: 0.9 g/dL (ref 0.7–1.3)
Gamma Globulin: 1.7 g/dL (ref 0.4–1.8)
Globulin, Total: 3.7 g/dL (ref 2.2–3.9)
Total Protein ELP: 6 g/dL (ref 6.0–8.5)

## 2022-07-09 LAB — BRAIN NATRIURETIC PEPTIDE: B Natriuretic Peptide: 776.9 pg/mL — ABNORMAL HIGH (ref 0.0–100.0)

## 2022-07-13 ENCOUNTER — Other Ambulatory Visit
Admission: RE | Admit: 2022-07-13 | Discharge: 2022-07-13 | Disposition: A | Discharge status: Home / Self Care | Payer: Medicare HMO | Source: Ambulatory Visit | Attending: Nurse Practitioner | Admitting: Nurse Practitioner

## 2022-07-13 DIAGNOSIS — I5022 Chronic systolic (congestive) heart failure: Secondary | ICD-10-CM | POA: Insufficient documentation

## 2022-07-13 DIAGNOSIS — I4891 Unspecified atrial fibrillation: Secondary | ICD-10-CM | POA: Diagnosis not present

## 2022-07-13 LAB — BRAIN NATRIURETIC PEPTIDE: B Natriuretic Peptide: 675.1 pg/mL — ABNORMAL HIGH (ref 0.0–100.0)

## 2022-07-14 ENCOUNTER — Encounter: Payer: Self-pay | Admitting: Oncology

## 2022-07-14 ENCOUNTER — Other Ambulatory Visit: Payer: Self-pay | Admitting: *Deleted

## 2022-07-14 DIAGNOSIS — R0602 Shortness of breath: Secondary | ICD-10-CM | POA: Diagnosis not present

## 2022-07-14 DIAGNOSIS — I890 Lymphedema, not elsewhere classified: Secondary | ICD-10-CM | POA: Diagnosis not present

## 2022-07-14 DIAGNOSIS — J90 Pleural effusion, not elsewhere classified: Secondary | ICD-10-CM | POA: Diagnosis not present

## 2022-07-14 DIAGNOSIS — G479 Sleep disorder, unspecified: Secondary | ICD-10-CM | POA: Diagnosis not present

## 2022-07-14 MED ORDER — PREDNISONE 10 MG (21) PO TBPK: ORAL_TABLET | ORAL | 0 refills | Status: DC

## 2022-07-15 ENCOUNTER — Telehealth: Payer: Self-pay | Admitting: *Deleted

## 2022-07-15 NOTE — Progress Notes (Signed)
  Care Coordination Note  07/15/2022 Name: [name] MRN: [id number] DOB: May 06, 1950  [name] is a 72 y.o. year old male who is a primary care patient of Dorothey Baseman, MD and is actively engaged with the care management team. I reached out to Melvyn Novas by phone today to assist with re-scheduling a follow up visit with the RN Case Manager  Follow up plan: Unsuccessful telephone outreach attempt made. A HIPAA compliant phone message was left for the patient providing contact information and requesting a return call.   Burman Nieves, CCMA Care Coordination Care Guide Direct Dial: [phone]

## 2022-07-20 ENCOUNTER — Ambulatory Visit: Payer: Medicare HMO | Admitting: Nurse Practitioner

## 2022-07-20 ENCOUNTER — Ambulatory Visit: Payer: Medicare HMO

## 2022-07-20 ENCOUNTER — Other Ambulatory Visit: Payer: Medicare HMO

## 2022-07-23 ENCOUNTER — Ambulatory Visit: Payer: Medicare HMO

## 2022-07-27 ENCOUNTER — Inpatient Hospital Stay: Payer: Medicare HMO

## 2022-07-27 ENCOUNTER — Inpatient Hospital Stay: Payer: Medicare HMO | Admitting: Oncology

## 2022-07-29 ENCOUNTER — Other Ambulatory Visit: Payer: Medicare HMO

## 2022-07-30 ENCOUNTER — Ambulatory Visit: Payer: Medicare HMO | Admitting: Oncology

## 2022-07-30 ENCOUNTER — Ambulatory Visit: Payer: Medicare HMO

## 2022-08-03 ENCOUNTER — Ambulatory Visit: Payer: Medicare HMO | Admitting: Dermatology

## 2022-08-15 DEATH — deceased

## 2022-08-17 ENCOUNTER — Ambulatory Visit: Payer: Medicare HMO

## 2022-08-17 ENCOUNTER — Ambulatory Visit: Payer: Medicare HMO | Admitting: Oncology

## 2022-08-17 ENCOUNTER — Other Ambulatory Visit: Payer: Medicare HMO

## 2022-08-31 ENCOUNTER — Ambulatory Visit: Payer: Medicare HMO | Admitting: Dermatology

## 2022-11-05 ENCOUNTER — Ambulatory Visit (INDEPENDENT_AMBULATORY_CARE_PROVIDER_SITE_OTHER): Payer: Medicare HMO | Admitting: Vascular Surgery

## 2022-11-05 ENCOUNTER — Encounter (INDEPENDENT_AMBULATORY_CARE_PROVIDER_SITE_OTHER): Payer: Medicare HMO
